# Patient Record
Sex: Female | Born: 1945
Health system: Southern US, Community
[De-identification: ages and names within clinical notes are randomized; demographics above are authoritative.]

## PROBLEM LIST (undated history)

## (undated) DIAGNOSIS — K5790 Diverticulosis of intestine, part unspecified, without perforation or abscess without bleeding: Secondary | ICD-10-CM

## (undated) DIAGNOSIS — R609 Edema, unspecified: Secondary | ICD-10-CM

## (undated) DIAGNOSIS — K219 Gastro-esophageal reflux disease without esophagitis: Secondary | ICD-10-CM

## (undated) DIAGNOSIS — F32A Depression, unspecified: Secondary | ICD-10-CM

## (undated) DIAGNOSIS — J40 Bronchitis, not specified as acute or chronic: Secondary | ICD-10-CM

## (undated) DIAGNOSIS — R51 Headache: Secondary | ICD-10-CM

## (undated) DIAGNOSIS — M549 Dorsalgia, unspecified: Secondary | ICD-10-CM

## (undated) DIAGNOSIS — Z87442 Personal history of urinary calculi: Secondary | ICD-10-CM

## (undated) DIAGNOSIS — C801 Malignant (primary) neoplasm, unspecified: Secondary | ICD-10-CM

## (undated) DIAGNOSIS — M48062 Spinal stenosis, lumbar region with neurogenic claudication: Secondary | ICD-10-CM

## (undated) DIAGNOSIS — F419 Anxiety disorder, unspecified: Secondary | ICD-10-CM

## (undated) DIAGNOSIS — F329 Major depressive disorder, single episode, unspecified: Secondary | ICD-10-CM

## (undated) DIAGNOSIS — Z86010 Personal history of colonic polyps: Secondary | ICD-10-CM

## (undated) DIAGNOSIS — I10 Essential (primary) hypertension: Secondary | ICD-10-CM

## (undated) DIAGNOSIS — IMO0001 Reserved for inherently not codable concepts without codable children: Secondary | ICD-10-CM

## (undated) DIAGNOSIS — J189 Pneumonia, unspecified organism: Secondary | ICD-10-CM

## (undated) DIAGNOSIS — Z5189 Encounter for other specified aftercare: Secondary | ICD-10-CM

## (undated) DIAGNOSIS — R9389 Abnormal findings on diagnostic imaging of other specified body structures: Secondary | ICD-10-CM

## (undated) DIAGNOSIS — M1990 Unspecified osteoarthritis, unspecified site: Secondary | ICD-10-CM

## (undated) DIAGNOSIS — R21 Rash and other nonspecific skin eruption: Secondary | ICD-10-CM

## (undated) DIAGNOSIS — R600 Localized edema: Secondary | ICD-10-CM

## (undated) DIAGNOSIS — D649 Anemia, unspecified: Secondary | ICD-10-CM

## (undated) DIAGNOSIS — Z8739 Personal history of other diseases of the musculoskeletal system and connective tissue: Secondary | ICD-10-CM

## (undated) DIAGNOSIS — G9332 Myalgic encephalomyelitis/chronic fatigue syndrome: Secondary | ICD-10-CM

## (undated) DIAGNOSIS — G629 Polyneuropathy, unspecified: Secondary | ICD-10-CM

## (undated) DIAGNOSIS — Z972 Presence of dental prosthetic device (complete) (partial): Secondary | ICD-10-CM

## (undated) DIAGNOSIS — N183 Chronic kidney disease, stage 3 unspecified: Secondary | ICD-10-CM

## (undated) DIAGNOSIS — R5382 Chronic fatigue, unspecified: Secondary | ICD-10-CM

## (undated) DIAGNOSIS — IMO0002 Reserved for concepts with insufficient information to code with codable children: Secondary | ICD-10-CM

## (undated) DIAGNOSIS — H9319 Tinnitus, unspecified ear: Secondary | ICD-10-CM

## (undated) DIAGNOSIS — M545 Low back pain, unspecified: Secondary | ICD-10-CM

## (undated) DIAGNOSIS — E785 Hyperlipidemia, unspecified: Secondary | ICD-10-CM

## (undated) DIAGNOSIS — G4733 Obstructive sleep apnea (adult) (pediatric): Secondary | ICD-10-CM

## (undated) DIAGNOSIS — K5730 Diverticulosis of large intestine without perforation or abscess without bleeding: Secondary | ICD-10-CM

## (undated) DIAGNOSIS — M2140 Flat foot [pes planus] (acquired), unspecified foot: Secondary | ICD-10-CM

## (undated) DIAGNOSIS — R0609 Other forms of dyspnea: Secondary | ICD-10-CM

## (undated) DIAGNOSIS — M51369 Other intervertebral disc degeneration, lumbar region without mention of lumbar back pain or lower extremity pain: Secondary | ICD-10-CM

## (undated) DIAGNOSIS — E119 Type 2 diabetes mellitus without complications: Secondary | ICD-10-CM

## (undated) DIAGNOSIS — C50919 Malignant neoplasm of unspecified site of unspecified female breast: Secondary | ICD-10-CM

## (undated) DIAGNOSIS — Z860101 Personal history of adenomatous and serrated colon polyps: Secondary | ICD-10-CM

## (undated) DIAGNOSIS — R0602 Shortness of breath: Secondary | ICD-10-CM

## (undated) DIAGNOSIS — M4800 Spinal stenosis, site unspecified: Secondary | ICD-10-CM

## (undated) DIAGNOSIS — Z923 Personal history of irradiation: Secondary | ICD-10-CM

## (undated) DIAGNOSIS — M2550 Pain in unspecified joint: Secondary | ICD-10-CM

## (undated) DIAGNOSIS — H9313 Tinnitus, bilateral: Secondary | ICD-10-CM

## (undated) DIAGNOSIS — D573 Sickle-cell trait: Secondary | ICD-10-CM

## (undated) DIAGNOSIS — G8929 Other chronic pain: Secondary | ICD-10-CM

## (undated) HISTORY — DX: Obstructive sleep apnea (adult) (pediatric): G47.33

## (undated) HISTORY — PX: COLONOSCOPY W/ POLYPECTOMY: SHX1380

## (undated) HISTORY — DX: Chronic fatigue, unspecified: R53.82

## (undated) HISTORY — PX: BACK SURGERY: SHX140

## (undated) HISTORY — DX: Dorsalgia, unspecified: M54.9

## (undated) HISTORY — DX: Malignant (primary) neoplasm, unspecified: C80.1

## (undated) HISTORY — DX: Pain in unspecified joint: M25.50

## (undated) HISTORY — DX: Edema, unspecified: R60.9

## (undated) HISTORY — DX: Tinnitus, unspecified ear: H93.19

## (undated) HISTORY — DX: Diverticulosis of intestine, part unspecified, without perforation or abscess without bleeding: K57.90

## (undated) HISTORY — PX: BELPHAROPTOSIS REPAIR: SHX369

## (undated) HISTORY — PX: TOE SURGERY: SHX1073

## (undated) HISTORY — DX: Essential (primary) hypertension: I10

## (undated) HISTORY — DX: Hyperlipidemia, unspecified: E78.5

## (undated) HISTORY — DX: Spinal stenosis, site unspecified: M48.00

## (undated) HISTORY — DX: Myalgic encephalomyelitis/chronic fatigue syndrome: G93.32

## (undated) HISTORY — PX: JOINT REPLACEMENT: SHX530

## (undated) HISTORY — PX: POLYPECTOMY: SHX149

## (undated) HISTORY — DX: Personal history of colonic polyps: Z86.010

## (undated) HISTORY — PX: BLEPHAROPLASTY: SUR158

---

## 1976-07-31 HISTORY — PX: APPENDECTOMY: SHX54

## 1992-07-31 HISTORY — PX: THYROID CYST EXCISION: SHX2511

## 1996-07-31 DIAGNOSIS — Z860100 Personal history of colon polyps, unspecified: Secondary | ICD-10-CM

## 1996-07-31 HISTORY — DX: Personal history of colonic polyps: Z86.010

## 1996-07-31 HISTORY — DX: Personal history of colon polyps, unspecified: Z86.0100

## 1998-04-29 ENCOUNTER — Other Ambulatory Visit: Admission: RE | Admit: 1998-04-29 | Discharge: 1998-04-29 | Payer: Self-pay | Admitting: Obstetrics and Gynecology

## 1999-06-02 ENCOUNTER — Other Ambulatory Visit: Admission: RE | Admit: 1999-06-02 | Discharge: 1999-06-02 | Payer: Self-pay | Admitting: Obstetrics and Gynecology

## 1999-11-04 ENCOUNTER — Encounter: Admission: RE | Admit: 1999-11-04 | Discharge: 1999-11-04 | Payer: Self-pay | Admitting: *Deleted

## 1999-11-04 ENCOUNTER — Encounter: Payer: Self-pay | Admitting: *Deleted

## 2000-05-14 ENCOUNTER — Other Ambulatory Visit: Admission: RE | Admit: 2000-05-14 | Discharge: 2000-05-14 | Payer: Self-pay | Admitting: Obstetrics and Gynecology

## 2001-02-15 ENCOUNTER — Other Ambulatory Visit: Admission: RE | Admit: 2001-02-15 | Discharge: 2001-02-15 | Payer: Self-pay | Admitting: Obstetrics and Gynecology

## 2001-02-15 ENCOUNTER — Encounter (INDEPENDENT_AMBULATORY_CARE_PROVIDER_SITE_OTHER): Payer: Self-pay

## 2001-05-28 ENCOUNTER — Other Ambulatory Visit: Admission: RE | Admit: 2001-05-28 | Discharge: 2001-05-28 | Payer: Self-pay | Admitting: Obstetrics and Gynecology

## 2002-06-12 ENCOUNTER — Other Ambulatory Visit: Admission: RE | Admit: 2002-06-12 | Discharge: 2002-06-12 | Payer: Self-pay | Admitting: Obstetrics and Gynecology

## 2003-06-16 ENCOUNTER — Other Ambulatory Visit: Admission: RE | Admit: 2003-06-16 | Discharge: 2003-06-16 | Payer: Self-pay | Admitting: Obstetrics and Gynecology

## 2004-06-16 ENCOUNTER — Other Ambulatory Visit: Admission: RE | Admit: 2004-06-16 | Discharge: 2004-06-16 | Payer: Self-pay | Admitting: Obstetrics and Gynecology

## 2004-07-14 ENCOUNTER — Ambulatory Visit (HOSPITAL_COMMUNITY): Admission: RE | Admit: 2004-07-14 | Discharge: 2004-07-14 | Payer: Self-pay | Admitting: General Surgery

## 2004-07-31 DIAGNOSIS — G4733 Obstructive sleep apnea (adult) (pediatric): Secondary | ICD-10-CM

## 2004-07-31 HISTORY — DX: Obstructive sleep apnea (adult) (pediatric): G47.33

## 2004-07-31 HISTORY — PX: TOTAL KNEE ARTHROPLASTY: SHX125

## 2004-08-05 ENCOUNTER — Ambulatory Visit: Payer: Self-pay | Admitting: Internal Medicine

## 2004-08-05 ENCOUNTER — Ambulatory Visit (HOSPITAL_BASED_OUTPATIENT_CLINIC_OR_DEPARTMENT_OTHER): Admission: RE | Admit: 2004-08-05 | Discharge: 2004-08-05 | Payer: Self-pay | Admitting: Family Medicine

## 2004-08-19 ENCOUNTER — Inpatient Hospital Stay (HOSPITAL_COMMUNITY): Admission: RE | Admit: 2004-08-19 | Discharge: 2004-08-23 | Payer: Self-pay | Admitting: Orthopedic Surgery

## 2004-08-19 HISTORY — PX: TOTAL KNEE ARTHROPLASTY: SHX125

## 2004-09-14 ENCOUNTER — Ambulatory Visit (HOSPITAL_COMMUNITY): Admission: RE | Admit: 2004-09-14 | Discharge: 2004-09-14 | Payer: Self-pay | Admitting: Orthopedic Surgery

## 2004-12-05 ENCOUNTER — Ambulatory Visit: Payer: Self-pay | Admitting: Critical Care Medicine

## 2005-01-18 ENCOUNTER — Ambulatory Visit: Payer: Self-pay | Admitting: Critical Care Medicine

## 2005-02-15 ENCOUNTER — Ambulatory Visit: Payer: Self-pay | Admitting: Critical Care Medicine

## 2005-04-04 ENCOUNTER — Ambulatory Visit: Payer: Self-pay | Admitting: Internal Medicine

## 2005-04-14 ENCOUNTER — Ambulatory Visit: Payer: Self-pay | Admitting: Internal Medicine

## 2005-05-17 ENCOUNTER — Other Ambulatory Visit: Admission: RE | Admit: 2005-05-17 | Discharge: 2005-05-17 | Payer: Self-pay | Admitting: Obstetrics and Gynecology

## 2005-05-24 ENCOUNTER — Ambulatory Visit: Payer: Self-pay | Admitting: Physical Medicine & Rehabilitation

## 2005-05-24 ENCOUNTER — Inpatient Hospital Stay (HOSPITAL_COMMUNITY): Admission: RE | Admit: 2005-05-24 | Discharge: 2005-05-29 | Payer: Self-pay | Admitting: Orthopedic Surgery

## 2005-05-24 HISTORY — PX: TOTAL KNEE ARTHROPLASTY: SHX125

## 2005-06-27 ENCOUNTER — Ambulatory Visit (HOSPITAL_COMMUNITY): Admission: RE | Admit: 2005-06-27 | Discharge: 2005-06-27 | Payer: Self-pay | Admitting: Otolaryngology

## 2005-10-04 ENCOUNTER — Ambulatory Visit (HOSPITAL_COMMUNITY): Admission: RE | Admit: 2005-10-04 | Discharge: 2005-10-04 | Payer: Self-pay | Admitting: *Deleted

## 2005-10-04 ENCOUNTER — Encounter (INDEPENDENT_AMBULATORY_CARE_PROVIDER_SITE_OTHER): Payer: Self-pay | Admitting: Specialist

## 2005-10-04 HISTORY — PX: HYSTEROSCOPY WITH D & C: SHX1775

## 2006-03-22 ENCOUNTER — Encounter: Admission: RE | Admit: 2006-03-22 | Discharge: 2006-03-22 | Payer: Self-pay | Admitting: Orthopedic Surgery

## 2006-12-23 ENCOUNTER — Encounter: Admission: RE | Admit: 2006-12-23 | Discharge: 2006-12-23 | Payer: Self-pay | Admitting: Family Medicine

## 2007-09-20 DIAGNOSIS — G4730 Sleep apnea, unspecified: Secondary | ICD-10-CM | POA: Insufficient documentation

## 2007-09-20 DIAGNOSIS — I10 Essential (primary) hypertension: Secondary | ICD-10-CM | POA: Insufficient documentation

## 2007-09-20 DIAGNOSIS — E785 Hyperlipidemia, unspecified: Secondary | ICD-10-CM | POA: Insufficient documentation

## 2007-09-23 ENCOUNTER — Ambulatory Visit: Payer: Self-pay | Admitting: Critical Care Medicine

## 2007-10-11 ENCOUNTER — Encounter: Payer: Self-pay | Admitting: Critical Care Medicine

## 2007-10-15 ENCOUNTER — Ambulatory Visit: Payer: Self-pay | Admitting: Pulmonary Disease

## 2007-10-15 DIAGNOSIS — G4700 Insomnia, unspecified: Secondary | ICD-10-CM | POA: Insufficient documentation

## 2008-08-12 ENCOUNTER — Ambulatory Visit: Payer: Self-pay | Admitting: Vascular Surgery

## 2008-08-12 ENCOUNTER — Ambulatory Visit: Admission: RE | Admit: 2008-08-12 | Discharge: 2008-08-12 | Payer: Self-pay | Admitting: Family Medicine

## 2009-02-18 ENCOUNTER — Encounter: Admission: RE | Admit: 2009-02-18 | Discharge: 2009-02-18 | Payer: Self-pay | Admitting: Family Medicine

## 2009-11-27 ENCOUNTER — Inpatient Hospital Stay (HOSPITAL_COMMUNITY): Admission: EM | Admit: 2009-11-27 | Discharge: 2009-11-28 | Payer: Self-pay | Admitting: Emergency Medicine

## 2009-12-29 HISTORY — PX: TRANSPHENOIDAL / TRANSNASAL HYPOPHYSECTOMY / RESECTION PITUITARY TUMOR: SUR1382

## 2010-01-25 ENCOUNTER — Inpatient Hospital Stay (HOSPITAL_COMMUNITY): Admission: RE | Admit: 2010-01-25 | Discharge: 2010-01-29 | Payer: Self-pay | Admitting: Neurosurgery

## 2010-01-25 ENCOUNTER — Encounter (INDEPENDENT_AMBULATORY_CARE_PROVIDER_SITE_OTHER): Payer: Self-pay | Admitting: Neurosurgery

## 2010-01-25 HISTORY — PX: TRANSPHENOIDAL PITUITARY RESECTION: SHX2572

## 2010-10-16 LAB — BASIC METABOLIC PANEL WITH GFR
BUN: 11 mg/dL (ref 6–23)
BUN: 15 mg/dL (ref 6–23)
BUN: 17 mg/dL (ref 6–23)
CO2: 24 meq/L (ref 19–32)
CO2: 29 meq/L (ref 19–32)
CO2: 29 meq/L (ref 19–32)
Calcium: 10.2 mg/dL (ref 8.4–10.5)
Calcium: 9 mg/dL (ref 8.4–10.5)
Calcium: 9.1 mg/dL (ref 8.4–10.5)
Chloride: 104 meq/L (ref 96–112)
Chloride: 105 meq/L (ref 96–112)
Chloride: 109 meq/L (ref 96–112)
Creatinine, Ser: 1.08 mg/dL (ref 0.4–1.2)
Creatinine, Ser: 1.18 mg/dL (ref 0.4–1.2)
Creatinine, Ser: 1.29 mg/dL — ABNORMAL HIGH (ref 0.4–1.2)
GFR calc non Af Amer: 41 mL/min — ABNORMAL LOW
GFR calc non Af Amer: 46 mL/min — ABNORMAL LOW
GFR calc non Af Amer: 51 mL/min — ABNORMAL LOW
Glucose, Bld: 108 mg/dL — ABNORMAL HIGH (ref 70–99)
Glucose, Bld: 142 mg/dL — ABNORMAL HIGH (ref 70–99)
Glucose, Bld: 187 mg/dL — ABNORMAL HIGH (ref 70–99)
Potassium: 3.3 meq/L — ABNORMAL LOW (ref 3.5–5.1)
Potassium: 3.7 meq/L (ref 3.5–5.1)
Potassium: 3.9 meq/L (ref 3.5–5.1)
Sodium: 138 meq/L (ref 135–145)
Sodium: 140 meq/L (ref 135–145)
Sodium: 141 meq/L (ref 135–145)

## 2010-10-16 LAB — CBC
HCT: 32.8 % — ABNORMAL LOW (ref 36.0–46.0)
Hemoglobin: 10.9 g/dL — ABNORMAL LOW (ref 12.0–15.0)
MCH: 27.5 pg (ref 26.0–34.0)
MCH: 27.5 pg (ref 26.0–34.0)
MCH: 27.7 pg (ref 26.0–34.0)
MCH: 27.7 pg (ref 26.0–34.0)
MCHC: 33.2 g/dL (ref 30.0–36.0)
MCV: 82.5 fL (ref 78.0–100.0)
MCV: 83.6 fL (ref 78.0–100.0)
MCV: 83.7 fL (ref 78.0–100.0)
Platelets: 276 10*3/uL (ref 150–400)
Platelets: 284 10*3/uL (ref 150–400)
Platelets: 291 10*3/uL (ref 150–400)
RBC: 3.98 MIL/uL (ref 3.87–5.11)
RBC: 4.05 MIL/uL (ref 3.87–5.11)
RBC: 4.39 MIL/uL (ref 3.87–5.11)
RDW: 15.4 % (ref 11.5–15.5)
WBC: 11.3 10*3/uL — ABNORMAL HIGH (ref 4.0–10.5)

## 2010-10-16 LAB — BASIC METABOLIC PANEL
BUN: 12 mg/dL (ref 6–23)
CO2: 25 mEq/L (ref 19–32)
CO2: 27 mEq/L (ref 19–32)
Chloride: 105 mEq/L (ref 96–112)
Chloride: 107 mEq/L (ref 96–112)
Creatinine, Ser: 1.14 mg/dL (ref 0.4–1.2)
Creatinine, Ser: 1.2 mg/dL (ref 0.4–1.2)
GFR calc Af Amer: 58 mL/min — ABNORMAL LOW (ref >60)
Glucose, Bld: 154 mg/dL — ABNORMAL HIGH (ref 70–99)

## 2010-10-16 LAB — ABO/RH: ABO/RH(D): A POS

## 2010-10-16 LAB — SURGICAL PCR SCREEN
MRSA, PCR: NEGATIVE
Staphylococcus aureus: POSITIVE — AB

## 2010-10-16 LAB — URINALYSIS, ROUTINE W REFLEX MICROSCOPIC
Bilirubin Urine: NEGATIVE
Hgb urine dipstick: NEGATIVE
Nitrite: NEGATIVE
Protein, ur: NEGATIVE mg/dL
Urobilinogen, UA: 0.2 mg/dL (ref 0.0–1.0)

## 2010-10-18 LAB — DIFFERENTIAL
Basophils Absolute: 0 10*3/uL (ref 0.0–0.1)
Eosinophils Relative: 2 % (ref 0–5)
Lymphocytes Relative: 18 % (ref 12–46)
Monocytes Absolute: 0.6 10*3/uL (ref 0.1–1.0)

## 2010-10-18 LAB — BASIC METABOLIC PANEL
CO2: 29 mEq/L (ref 19–32)
GFR calc non Af Amer: 46 mL/min — ABNORMAL LOW (ref >60)
Glucose, Bld: 121 mg/dL — ABNORMAL HIGH (ref 70–99)
Potassium: 3.5 mEq/L (ref 3.5–5.1)
Sodium: 146 mEq/L — ABNORMAL HIGH (ref 135–145)

## 2010-10-18 LAB — URINALYSIS, ROUTINE W REFLEX MICROSCOPIC
Ketones, ur: NEGATIVE mg/dL
Nitrite: NEGATIVE
Protein, ur: NEGATIVE mg/dL
Urobilinogen, UA: 0.2 mg/dL (ref 0.0–1.0)

## 2010-10-18 LAB — METANEPHRINES, PLASMA
Metanephrine, Free: <25 pg/mL (ref <=57)
Normetanephrine, Free: 81 pg/mL (ref <=148)
Total Metanephrines-Plasma: 81 pg/mL (ref <=205)

## 2010-10-18 LAB — URINE CULTURE: Colony Count: >=100000

## 2010-10-18 LAB — CBC
HCT: 34.9 % — ABNORMAL LOW (ref 36.0–46.0)
Hemoglobin: 11.6 g/dL — ABNORMAL LOW (ref 12.0–15.0)
MCHC: 33 g/dL (ref 30.0–36.0)
RBC: 4.34 MIL/uL (ref 3.87–5.11)
RDW: 15.7 % — ABNORMAL HIGH (ref 11.5–15.5)

## 2010-10-18 LAB — COMPREHENSIVE METABOLIC PANEL
ALT: 20 U/L (ref 0–35)
AST: 21 U/L (ref 0–37)
Alkaline Phosphatase: 64 U/L (ref 39–117)
CO2: 26 mEq/L (ref 19–32)
Calcium: 9.6 mg/dL (ref 8.4–10.5)
GFR calc Af Amer: 57 mL/min — ABNORMAL LOW (ref >60)
Potassium: 3.6 mEq/L (ref 3.5–5.1)
Sodium: 137 mEq/L (ref 135–145)
Total Protein: 7.8 g/dL (ref 6.0–8.3)

## 2010-10-18 LAB — TSH: TSH: 2.028 u[IU]/mL (ref 0.350–4.500)

## 2010-10-18 LAB — CK TOTAL AND CKMB (NOT AT ARMC)
CK, MB: 2.3 ng/mL (ref 0.3–4.0)
Relative Index: 1 (ref 0.0–2.5)
Total CK: 240 U/L — ABNORMAL HIGH (ref 7–177)

## 2010-10-18 LAB — GROWTH HORMONE: Growth Hormone: <0.05 ng/mL (ref 0.03–10.00)

## 2010-10-18 LAB — TROPONIN I: Troponin I: 0.01 ng/mL (ref 0.00–0.06)

## 2010-10-18 LAB — CARDIAC PANEL(CRET KIN+CKTOT+MB+TROPI)
Relative Index: 0.7 (ref 0.0–2.5)
Troponin I: 0.01 ng/mL (ref 0.00–0.06)
Troponin I: 0.01 ng/mL (ref 0.00–0.06)

## 2010-10-18 LAB — RAPID URINE DRUG SCREEN, HOSP PERFORMED
Amphetamines: NOT DETECTED
Barbiturates: NOT DETECTED

## 2010-10-18 LAB — POCT CARDIAC MARKERS: Troponin i, poc: <0.05 ng/mL (ref 0.00–0.09)

## 2010-10-18 LAB — T3: T3, Total: 75.8 ng/dl — ABNORMAL LOW (ref 80.0–204.0)

## 2010-10-18 LAB — LUTEINIZING HORMONE: LH: 4.5 m[IU]/mL

## 2010-10-18 LAB — INSULIN-LIKE GROWTH FACTOR: Somatomedin C: 151

## 2010-10-18 LAB — LIPID PANEL: Triglycerides: 141 mg/dL (ref <150)

## 2010-10-30 DIAGNOSIS — J189 Pneumonia, unspecified organism: Secondary | ICD-10-CM

## 2010-10-30 HISTORY — DX: Pneumonia, unspecified organism: J18.9

## 2010-11-09 ENCOUNTER — Inpatient Hospital Stay (HOSPITAL_COMMUNITY)
Admission: EM | Admit: 2010-11-09 | Discharge: 2010-11-14 | DRG: 195 | Disposition: A | Discharge status: Home / Self Care | Payer: Medicare Other | Attending: Internal Medicine | Admitting: Internal Medicine

## 2010-11-09 ENCOUNTER — Emergency Department (HOSPITAL_COMMUNITY): Payer: Medicare Other

## 2010-11-09 DIAGNOSIS — M4800 Spinal stenosis, site unspecified: Secondary | ICD-10-CM | POA: Diagnosis present

## 2010-11-09 DIAGNOSIS — I10 Essential (primary) hypertension: Secondary | ICD-10-CM | POA: Diagnosis present

## 2010-11-09 DIAGNOSIS — D649 Anemia, unspecified: Secondary | ICD-10-CM | POA: Diagnosis present

## 2010-11-09 DIAGNOSIS — R51 Headache: Secondary | ICD-10-CM | POA: Diagnosis present

## 2010-11-09 DIAGNOSIS — M549 Dorsalgia, unspecified: Secondary | ICD-10-CM | POA: Diagnosis present

## 2010-11-09 DIAGNOSIS — E785 Hyperlipidemia, unspecified: Secondary | ICD-10-CM | POA: Diagnosis present

## 2010-11-09 DIAGNOSIS — R918 Other nonspecific abnormal finding of lung field: Secondary | ICD-10-CM | POA: Diagnosis present

## 2010-11-09 DIAGNOSIS — J189 Pneumonia, unspecified organism: Principal | ICD-10-CM | POA: Diagnosis present

## 2010-11-09 DIAGNOSIS — G8929 Other chronic pain: Secondary | ICD-10-CM | POA: Diagnosis present

## 2010-11-09 DIAGNOSIS — E669 Obesity, unspecified: Secondary | ICD-10-CM | POA: Diagnosis present

## 2010-11-09 LAB — COMPREHENSIVE METABOLIC PANEL
AST: 21 U/L (ref 0–37)
Albumin: 3.7 g/dL (ref 3.5–5.2)
Alkaline Phosphatase: 69 U/L (ref 39–117)
BUN: 17 mg/dL (ref 6–23)
Creatinine, Ser: 1.2 mg/dL (ref 0.4–1.2)
GFR calc Af Amer: 54 mL/min — ABNORMAL LOW (ref >60)
Potassium: 3.5 mEq/L (ref 3.5–5.1)
Total Protein: 8 g/dL (ref 6.0–8.3)

## 2010-11-09 LAB — CBC
MCV: 78.8 fL (ref 78.0–100.0)
Platelets: 272 10*3/uL (ref 150–400)
RBC: 4.38 MIL/uL (ref 3.87–5.11)
RDW: 15.9 % — ABNORMAL HIGH (ref 11.5–15.5)
WBC: 14 10*3/uL — ABNORMAL HIGH (ref 4.0–10.5)

## 2010-11-09 LAB — URINALYSIS, ROUTINE W REFLEX MICROSCOPIC
Bilirubin Urine: NEGATIVE
Glucose, UA: NEGATIVE mg/dL
Specific Gravity, Urine: 1.018 (ref 1.005–1.030)

## 2010-11-09 LAB — DIFFERENTIAL
Basophils Relative: 0 % (ref 0–1)
Eosinophils Absolute: 0 10*3/uL (ref 0.0–0.7)
Eosinophils Relative: 0 % (ref 0–5)
Lymphs Abs: 2.1 10*3/uL (ref 0.7–4.0)
Neutrophils Relative %: 76 % (ref 43–77)

## 2010-11-09 LAB — URINE MICROSCOPIC-ADD ON

## 2010-11-10 LAB — COMPREHENSIVE METABOLIC PANEL
ALT: 25 U/L (ref 0–35)
AST: 20 U/L (ref 0–37)
Albumin: 3 g/dL — ABNORMAL LOW (ref 3.5–5.2)
Alkaline Phosphatase: 62 U/L (ref 39–117)
Chloride: 106 mEq/L (ref 96–112)
Creatinine, Ser: 1.04 mg/dL (ref 0.4–1.2)
GFR calc Af Amer: >60 mL/min (ref >60)
Potassium: 3.7 mEq/L (ref 3.5–5.1)
Sodium: 141 mEq/L (ref 135–145)
Total Bilirubin: 0.6 mg/dL (ref 0.3–1.2)

## 2010-11-10 LAB — URINE CULTURE: Colony Count: 7000

## 2010-11-10 LAB — CBC
Platelets: 274 10*3/uL (ref 150–400)
RBC: 4.08 MIL/uL (ref 3.87–5.11)
WBC: 11.9 10*3/uL — ABNORMAL HIGH (ref 4.0–10.5)

## 2010-11-10 LAB — LEGIONELLA ANTIGEN, URINE

## 2010-11-11 LAB — LIPID PANEL
LDL Cholesterol: 156 mg/dL — ABNORMAL HIGH (ref 0–99)
Triglycerides: 83 mg/dL (ref <150)
VLDL: 17 mg/dL (ref 0–40)

## 2010-11-11 LAB — CBC
HCT: 31 % — ABNORMAL LOW (ref 36.0–46.0)
Platelets: 280 10*3/uL (ref 150–400)
RDW: 15.9 % — ABNORMAL HIGH (ref 11.5–15.5)
WBC: 17.1 10*3/uL — ABNORMAL HIGH (ref 4.0–10.5)

## 2010-11-11 LAB — HEMOGLOBIN A1C: Mean Plasma Glucose: 146 mg/dL — ABNORMAL HIGH (ref <117)

## 2010-11-12 LAB — CULTURE, BLOOD (ROUTINE X 2)

## 2010-11-12 LAB — CBC
Platelets: 266 10*3/uL (ref 150–400)
RBC: 3.79 MIL/uL — ABNORMAL LOW (ref 3.87–5.11)
WBC: 15.7 10*3/uL — ABNORMAL HIGH (ref 4.0–10.5)

## 2010-11-12 LAB — DIFFERENTIAL
Basophils Absolute: 0 10*3/uL (ref 0.0–0.1)
Basophils Relative: 0 % (ref 0–1)
Eosinophils Absolute: 0.1 10*3/uL (ref 0.0–0.7)
Neutrophils Relative %: 78 % — ABNORMAL HIGH (ref 43–77)

## 2010-11-12 LAB — BASIC METABOLIC PANEL
Chloride: 108 mEq/L (ref 96–112)
GFR calc Af Amer: >60 mL/min (ref >60)
Potassium: 3.6 mEq/L (ref 3.5–5.1)

## 2010-11-13 LAB — VANCOMYCIN, TROUGH: Vancomycin Tr: 20.7 ug/mL — ABNORMAL HIGH (ref 10.0–20.0)

## 2010-11-14 ENCOUNTER — Inpatient Hospital Stay (HOSPITAL_COMMUNITY): Payer: Medicare Other

## 2010-11-14 LAB — CBC
HCT: 30.9 % — ABNORMAL LOW (ref 36.0–46.0)
Hemoglobin: 10 g/dL — ABNORMAL LOW (ref 12.0–15.0)
MCH: 25.4 pg — ABNORMAL LOW (ref 26.0–34.0)
MCHC: 32.4 g/dL (ref 30.0–36.0)

## 2010-11-14 NOTE — H&P (Signed)
[name], [name] NO.:  [id number]  MEDICAL RECORD NO.:  [id number]           PATIENT TYPE:  E  LOCATION:  WLED                         FACILITY:  [facility]  PHYSICIAN:  Richarda Overlie, MD       DATE OF BIRTH:  [birth year]  DATE OF ADMISSION:  11/09/2010 DATE OF DISCHARGE:                             HISTORY & PHYSICAL   PRIMARY CARE PHYSICIAN:  Dr. [name].  CHIEF COMPLAINT:  Headache.  SUBJECTIVE:  This is a 65 year old female who presents to the ED because of leukocytosis.  The patient presented to Urgent Care today because of headache and fever of 103 degrees Fahrenheit at home.  In the Urgent Care, the patient was found to have leukocytosis and the patient was prompted to come to the ER for further evaluation.  The patient denies any neck stiffness, but she does complain of global headache.  Initially the headache started out to be occipital headache and subsequently changed to global headache, which she has had for the last 2 weeks.  She denies any cough, congestion, or sore throat.  However, she has experienced occasional chills over the last couple of days.  She denies any nausea, vomiting, diarrhea, constipation, blood in the stool, or black tarry stools.  She denies any chest pain or any shortness of breath.  She denies any sick contacts.  The patient was found to have leukocytosis with a white count of 14,000 and chest x-ray that was consistent with right lower lobe pneumonia.  PAST MEDICAL HISTORY: 1. History of pituitary macroadenoma, status post transsphenoidal     surgery of the pituitary macroadenoma by Dr. Gerlene Fee. 2. Hypertension. 3. Dyslipidemia. 4. Morbid obesity.  PAST SURGICAL HISTORY:  History of recent transsphenoidal surgery for a pituitary macroadenoma, history of appendectomy in 1978, bilateral knee replacement, knee arthroscopy in 1994, bladder cyst removal in 1997, and surgery on the right eye in 2005.  HOME  MEDICATIONS:  Aspirin and pravastatin.  The patient cannot recall if she is on antihypertensive medication.  FAMILY HISTORY:  Significant for coronary artery disease, hypertension, diabetes, and arthritis.  SOCIAL HISTORY:  She is retired, married with 2 children who live in the area.  REVIEW OF SYSTEMS:  Complete review of systems was done as documented in HPI.  PHYSICAL EXAMINATION:  VITAL SIGNS:  Blood pressure 170/92, pulse 90, respirations 24, and temperature 100.6. GENERAL:  Comfortable, currently in no acute cardiopulmonary distress. HEENT:  Pupils equal and reactive.  Extraocular movements intact. NECK:  Supple.  No JVD. LUNGS: Clear to auscultation bilaterally.  No wheezes, crackles, or rhonchi. CARDIOVASCULAR:  Regular rate and rhythm.  No murmurs, rubs, or gallops. ABDOMEN:  Soft, nontender, and nondistended. EXTREMITIES:  Without cyanosis, clubbing, or edema. NEUROLOGIC:  Cranial nerves II through XII grossly intact. PSYCHIATRIC:  Appropriate mood and affect. LYMPHATIC:  No axillary, inguinal, or cervical lymphadenopathy.  LABORATORY DATA:  Urinalysis is negative for nitrite and leukocyte esterase.  Sodium 136, potassium 3.5, chloride 100, bicarbonate 26, glucose 100, BUN 17, creatinine 1.2, and calcium 10.1.  CBC, WBC 14.0 with a left shift, hemoglobin 11.3, hematocrit 34.5, and platelet count of  272.  ASSESSMENT AND PLAN: 1. Right lower lobe pneumonia/atypical pneumonia. 2. Normocytic anemia could be consistent with possibly mycoplasma     pneumonia.  The patient will be admitted for treatment of her pneumonia and her headache.  There are no signs of meningismus or meningitis at this time clinically.  Most likely the patient has atypical pneumonia, likely mycoplasma pneumonia, which is known to cause meningoencephalitis, which can explain her headache as well as hemolytic anemia, which can explain her anemia.  The patient will be treated with azithromycin and  Rocephin for her pneumonia.  The patient has a history of pituitary macroadenoma and transsphenoidal surgery.  She states that she had an MRI 2 weeks ago, which was within normal limits.  The patient also had a CT of the head without contrast today that does not show any acute intracranial findings.  The patient is also hypertensive with systolic blood pressure in 170, which could explain her headaches due to uncontrolled hypertension.  We will start her on Norvasc and try to reconcile home medications and titrate medications for better blood pressure control. She is a full code.     Richarda Overlie, MD     NA/MEDQ  D:  11/09/2010  T:  11/09/2010  Job:  [id number]  Electronically Signed by Richarda Overlie MD on 11/13/2010 04:40:01 PM

## 2010-11-16 LAB — CULTURE, BLOOD (ROUTINE X 2): Culture: NO GROWTH

## 2010-11-17 LAB — CULTURE, BLOOD (ROUTINE X 2)
Culture: NO GROWTH
Culture: NO GROWTH

## 2010-11-22 NOTE — Discharge Summary (Signed)
[name], [name]            ACCOUNT NO.:  [id number]  MEDICAL RECORD NO.:  [id number]           PATIENT TYPE:  I  LOCATION:  1503                         FACILITY:  [facility]  PHYSICIAN:  Druscilla Petsch I Nichole Keltner, MD      DATE OF BIRTH:  [birth year]  DATE OF ADMISSION:  11/09/2010 DATE OF DISCHARGE:  11/14/2010                              DISCHARGE SUMMARY   DISCHARGE DIAGNOSES: 1. Community-acquired pneumonia. 2. Headache, felt to be possibility related to community-acquired     pneumonia, hypertension, cannot rule out migraine, the patient has     already had an MRI with her neurosurgeon on April 11th, and seems     okay per patient. 3. Persistent right infrahilar fullness.  Recommend repeat chest x-ray     within 2-3 months and if it still persists, CT scan of the chest. 4. Anemia, need to follow up and monitor with primary care physician     as outpatient. 5. Leukocytosis, improving. 6. Hypertension. 7. Hyperlipidemia. 8. History of pituitary microadenoma, status post transsphenoidal     surgery.  This is followed up every 6 months with a neurosurgeon. 9. Coagulase-negative Staphylococcus aureus bacteremia in 1 out of 4     bottles, seems contaminant, fever resolved. 10.Chronic back pain and spinal stenosis. 11.Obesity.  CONSULTATION:  None.  MEDICATIONS: 1. Excedrin 1 tab q.6 h. as needed. 2. Levaquin 750 mg p.o. daily for 7 days. 3. Alprazolam 0.25 mg twice daily as needed. 4. Fish oil. 5. Losartan/hydrochlorothiazide 100/25 daily. 6. Niacin 500 mg p.o. twice daily. 7. Plavix 75 mg daily. 8. Systane lubricant eye drops. 9. Tramadol 50 mg every 6 hours as needed. 10.Verapamil SR 360 mg p.o. daily.  FOLLOWUP:  The patient needs to follow up with her physician by the end of this week and the patient is advised if headache gets worsen, need to come to the hospital, and contact her physician.  PROCEDURES: 1. Chest x-ray, possible right lower lobe infiltrate. 2. CT head  without contrast, interval change appearance of the     previously __________ sella turcica with near-opacification of the     right division of the sphenoid sinus and interval transsphenoidal     resection of previous pituitary mass. 3. Chest x-ray, persistent right infrahilar fullness, nonspecific,     could represent residual airspace disease.  Other consideration     include overlapping shadow versus underlying nodule, mass, or     adenopathy.  HISTORY OF PRESENT ILLNESS:  This is a 65 year old female, presented to the ED because of leukocytosis, the patient presented to urgent care, complaining of headache and fever of 103 at home, found to have leukocytosis and the patient transferred to ED for further workup.  The patient denies any neck stiffness or she does complain of global headache.  The patient denies any blurring of vision associated with it. Denies any cough, congestion or sore throat.  She has chills.  She is found to have white blood cells of 14 and chest x-ray suggests could be pneumonia. 1. The patient treated as a case of community-acquired pneumonia with     Levaquin.  Blood  cultures were negative, except 1 bottle of     staphylococcus, coagulase negative, felt to be secondary to     contamination.  The patient's headache felt to be possibility     secondary to the virus infection intervening with ongoing bacterial     infection.  The patient has an MRI done of her pituitary after the     procedure, which was negative.  She also had a CT head, which was     negative for any evidence of bleeding.  The patient denies any     worsening headache, denies any blurring of vision, and the patient     was treated symptomatically for the headache. 2. As per discussion with the patient, headache completely resolved.     Did not require any form of aggressive pain medications. 3. Leukocytosis, improved.  Repeat chest x-ray suggests still there is     fullness at the perihilar  area, recommend repeat chest x-ray.  The     patient informed, need to repeat chest x-ray within 1 month, and if     it still persists may need to have a CT scan of the chest. 4. Hypertension.  The patient will resume her blood pressure     medication include losartan/hydrochlorothiazide and verapamil 360     mg daily. 5. Febrile illness, felt to be secondary to community-acquired     pneumonia.  The patient has no more fever since April 14th.  The     patient was advised to follow up with her physician next week.  The patient needs to follow up with Dr. [name], repeat chest x- ray, and possibility of CT scan if chest x-ray did show the current fullness.  Also, the patient needs to follow up if headache recurs or worsens.     Tonia Avino Bosie Helper, MD     HIE/MEDQ  D:  11/14/2010  T:  11/15/2010  Job:  [id number]  cc:   [name] Quitter, M.D. Fax: [phone]  Electronically Signed by Ebony Cargo MD on 11/22/2010 02:05:09 PM

## 2010-11-29 NOTE — Group Therapy Note (Signed)
  NAMEOMAYA, NIELAND NO.:  [id number]  MEDICAL RECORD NO.:  [id number]           PATIENT TYPE:  I  LOCATION:  1503                         FACILITY:  [facility]  PHYSICIAN:  Homero Fellers, MD   DATE OF BIRTH:  Apr 25, 1946                                PROGRESS NOTE   INTERIM SUMMARY:  DISCHARGE DATE: To be decided.  DIAGNOSES: 1. Right lower lobe pneumonia. 2. Coagulase-negative Staphylococcus aureus bacteremia in 1 of 4     bottles, probably a contaminant. 3. High-grade fever, currently resolving. 4. Resolving leukocytosis. 5. Headache, which is clinically improved. 6. History of pituitary microadenoma. 7. Status post transsphenoidal surgery. 8. Chronic back pain and spinal stenosis. 9. Hyperlipidemia. 10.Morbid obesity.  MEDICATIONS: To be determined at discharge.  CONSULTATIONS: None.  PROCEDURES: None.  HOSPITAL COURSE: This is a 65 year old woman who presented to the emergency room becauseof headaches, fever and elevated white count at Urgent Care.  According to the patient, the headache has been going on off and on for the past 2 weeks.  She had no meningeal signs of confusion.  She has chronic back pain and she gets multiple back injections by her neurologist and spine doctor.  She has been following with her neurologist about headache and she has already got an MRI of the brain about 2 weeks ago which was within normal limits.  At admission, she also had a noncontrast CT of the head which showed no acute intracranial findings.  Temperature is beginning to normalize.  T-max in the past 24 hours is 100.  Headache has also improved.  She has no nausea or vomiting.  PHYSICAL EXAMINATION: VITAL SIGNS:  Today, blood pressure of 111/76, pulse 84, respirations 22, temperature is 99.1, O2 sat is 91% to 96% on 2 L.  GENERAL:  The patient appeared comfortable, in no distress.  NECK:  Supple.  No JVD. LUNGS:  Good air entry bilaterally with no  wheezing or crackles.  HEART: S1, S2 regular rate and rhythm.  No murmurs, rubs or gallops.  ABDOMEN: Obese, soft, nontender.  EXTREMITIES:  No edema.  NEUROLOGIC:  No focal deficits.  LABORATORY DATA: None today.  I have ordered for repeat labs tomorrow.  Repeat blood cultures done on the 13th showed no growth so far.  Blood culture and 11th is growing coagulase-negative Staphylococcus from a single blood culture bottle, that is 1 out of 2.  IMAGING STUDIES: Chest x-ray at admission showed evidence suggestive of right lower lobe infiltrates.  CT of the head showed no acute intracranial findings.  DISPOSITION: To home, hopefully in the next 24 to 48 hours.  DISCHARGE INSTRUCTIONS: 1. She can follow with Dr. Rennis Harding in 2 weeks, and also follow with her     neurologist after discharge. 2. Diet is 2 g sodium. 3. Activity as tolerated.     Homero Fellers, MD     FA/MEDQ  D:  11/13/2010  T:  11/13/2010  Job:  [id number]  Electronically Signed by Homero Fellers  on 11/29/2010 02:38:35 AM

## 2010-11-29 NOTE — Group Therapy Note (Signed)
  NAMEBREALYNN, CONTINO NO.:  [id number]  MEDICAL RECORD NO.:  [id number]           PATIENT TYPE:  I  LOCATION:  1503                         FACILITY:  [facility]  PHYSICIAN:  Homero Fellers, MD   DATE OF BIRTH:  1946-01-07                                PROGRESS NOTE   SUBJECTIVE: The patient is a little bit better today.  She is having some cough. Headache is improved.  She was having high-grade fever yesterday but this has improved since morning.  OBJECTIVE: VITAL SIGNS:  Blood pressure of 156/90, pulse 93, respirations 18, temperature 98.7.  Last fever spike was last night at 101.7.  O2 sat is 93% on room air. GENERAL:  The patient is lying in bed, comfortable in no distress. HEENT:  Pallor. Extraocular muscles are intact. NECK:  Supple.  No JVD, adenopathy, or thyromegaly. LUNGS:  Clear to auscultation bilaterally. HEART:  S1-S2.  No murmurs, rubs, or gallops. ABDOMEN:  Full, soft, obese, and nontender.  Bowel sounds present.  No masses. EXTREMITIES:  No edema.  LABORATORY DATA: White count is coming down, today 15.7 with very minimal left shift. Hemoglobin 9.7, platelet count is 266,000.  Blood culture, coagulase- negative staph in 1/2 blood culture bottles.  Repeat blood culture done yesterday is also pending.  ASSESSMENT: 1. Right lower lobe pneumonia, on antibiotics. 2. Coagulase-negative Staphylococcus aureus bacteremia, probably a     contaminant but in view of fever and leukocytosis, I will keep her     on vancomycin. 3. Resolving leukocytosis. 4. Improved NAD. 5. History of chronic back pain and spinal stenosis. 6. History of pituitary microadenoma. 7. Hyperlipidemia.  PLAN: 1. Continue antibiotics and Tessalon Perles for cough. 2. Follow pending blood cultures. 3. The patient can be discharged home probably in the next 48 hours if     no further fever spikes and WBC normalizes.  CONDITION: Improving.     Homero Fellers,  MD     FA/MEDQ  D:  11/12/2010  T:  11/12/2010  Job:  [id number]  Electronically Signed by Homero Fellers  on 11/29/2010 02:38:32 AM

## 2010-11-29 NOTE — Group Therapy Note (Signed)
  NAMEJANERA, PEUGH NO.:  [id number]  MEDICAL RECORD NO.:  [id number]           PATIENT TYPE:  I  LOCATION:  1503                         FACILITY:  [facility]  PHYSICIAN:  Homero Fellers, MD   DATE OF BIRTH:  09-Nov-1945                                PROGRESS NOTE   SUBJECTIVE: The patient is complaining of headaches.  She also has low-grade fever. She has been given Toradol, as well as Vicodin with little help.  OBJECTIVE: VITAL SIGNS:  This morning showed blood pressure of 126/76, pulse 91, respirations 20, temperature is 99.3, O2 sat 95% on room aim. GENERAL:  The patient lying in bed, comfortable, no distress. HEENT:  PERRLA. Extraocular muscles are intact. NECK:  Supple.  No JVD, adenopathy, or thyromegaly. LUNGS:  Clear to auscultation bilaterally. HEART:  S1, S2. No murmurs, rubs or gallops. ABDOMEN:  Obese, soft, nontender.  Bowel sounds present.  No masses. EXTREMITIES:  Trace edema bilaterally. NEUROLOGIC:  No deficits.  LABORATORY DATA: White count is up to 17.1, hemoglobin 10.1. Lipid profile showed LDL of 156, HDL of 32, total cholesterol of 205.  Urine culture is significant growth.  Blood culture is growing gram-positive cocci in 1 of 2 blood culture bottles, that is gram positive cocci in clusters.  ASSESSMENT: 1. Community acquired pneumonia. 2. Bacteremia (gram-positive cocci in clusters). 3. Worsening leukocytosis. 4. Persistent headache. 5. he patient has had repeated back injections for chronic back pain.     She also has history of pituitary macroadenoma status post     transsphenoidal surgery.  She follows with her neurologist and she     had an appointment this past week with an MRI done recently showing     no acute findings. 6. Anemia, fairly stable. 7. Hyperlipidemia which is not optimized.  PLAN: Change antibiotics to Levaquin and vancomycin.  Follow blood culture identification. Repeat blood culture if temperature more  than 100.8. Start naproxen and hydroxyzine for pain.  The patient also has a history of sleep apnea and use CPAP at home.  This will be resumed. Condition overall is stable.  I will increase her Zocor to 40 mg at bedtime to better control her hyperlipidemia.     Homero Fellers, MD     FA/MEDQ  D:  11/11/2010  T:  11/11/2010  Job:  [id number]  Electronically Signed by Homero Fellers  on 11/29/2010 02:38:26 AM

## 2010-12-07 ENCOUNTER — Other Ambulatory Visit: Payer: Self-pay | Admitting: Family Medicine

## 2010-12-07 ENCOUNTER — Ambulatory Visit
Admission: RE | Admit: 2010-12-07 | Discharge: 2010-12-07 | Disposition: A | Discharge status: Home / Self Care | Payer: Medicare Other | Source: Ambulatory Visit | Attending: Family Medicine | Admitting: Family Medicine

## 2010-12-07 DIAGNOSIS — J189 Pneumonia, unspecified organism: Secondary | ICD-10-CM

## 2010-12-16 NOTE — Op Note (Signed)
[name], [name] NO.:  [id number]   MEDICAL RECORD NO.:  [id number]          PATIENT TYPE:  INP   LOCATION:  0004                         FACILITY:  [facility]   PHYSICIAN:  Georges Lynch. Gioffre, M.D.DATE OF BIRTH:  [birth year]   DATE OF PROCEDURE:  05/24/2005  DATE OF DISCHARGE:                                 OPERATIVE REPORT   SURGEON:  Georges Lynch. Darrelyn Hillock, M.D.   ASSISTANT:  Jamelle Rushing, P.A.   PREOPERATIVE DIAGNOSIS:  Severe degenerative arthritis of the right knee  with a genu valgus deformity.   POSTOPERATIVE DIAGNOSIS:  Severe degenerative arthritis of the right knee  with a genu valgus deformity.   OPERATION:  Right total knee arthroplasty utilizing the DePuy rotating  platform system. All three components were cemented.   SIZES USED:  The patella with a size 38 resurfacing patella. The femoral  component was a size 4 right posterior cruciate sacrificing type, the tibial  component was a size 5 tibial tray with a size 4, 15 mm thickness insert.   PROCEDURE:  Under general anesthesia, routine orthopedic prep and draping of  the right lower extremity was carried out. She had 1 gram of IV Ancef. At  this time, an incision was made over the anterior aspect of the right knee,  bleeders identified and cauterized. Note prior to making the incision, we  did exsanguinate the right lower extremity with an Esmarch and then elevated  the tourniquet at 400 mmHg. Following this, an incision was made over the  anterior aspect of the right knee. She had 1 gram of IV Ancef preop. At this  time, two flaps were created. I then carried out a median parapatellar  incision reflecting the patella laterally and then went down and inserted  the self-retaining retractors. I then carried out medial and lateral  meniscectomies and excised the anterior posterior cruciate ligaments. Note,  she had a marked overgrowth of bone spurs in her femur, her patella, the  tibia. All of  these spurs were completely carefully removed prior to doing  any other procedure. Once we had the spurs removed, I then went down and  released the lateral and medial structures in the usual fashion. We first  put our initial drill hole in the intercondylar notch of the femur, #1 jig  was inserted and we removed 12 mm thickness off the distal femur because of  her contracture. I also noted, she had a large defect over the lateral joint  with a genu valgus. After doing this, we then inserted our #2 jig for a size  four femur. We cut our anterior, posterior and chamfering cuts. After this  was done, we then cut our tibia. Utilizing the intramedullary guide, I  removed 4 mm thickness off the affected lateral side of the tibia. We did a  nice release of the tibial plateau region because of the contracture and the  valgus deformity and we did remove the spur prior to making the cut. After  the cut was made, we then made our keel cut for a size 5 tibial tray. We  then  cut our notch cut out of the distal femur in the usual fashion. We then  inserted our trial components, went through a range of motion and had  excellent stability. We first tried a 12.5 mm thickness insert and then went  to a 15 mm thickness insert. We then cut our patella for a size 38 mm  patella. We did a resurfacing type procedure on the patella with three drill  holes in the patella. We then inserted our 38 mm patella trial and it fit  quite nicely at this point. We then removed all the trials, thoroughly water  picked out the knee, dried the knee out and cemented all three components in  simultaneously utilizing vancomycin highlight in the cement. After this was  done, we then removed all loose pieces of cement, water picked the knee,  dried the knee out and then when we selected our appropriate tibial insert,  we inserted the 15 mm thickness rotating platform insert, took the knee  through motion, had excellent stability in  the medial and lateral plane. We  had a nice lateral release well and flexion extension was excellent. The  patella tracked nicely in the midline. We then thoroughly water picked out  the knee again, inserted a Hemovac drain and closed the knee in the usual  fashion. The subcu was closed with #0 Vicryl, skin was closed with metal  staples. A sterile Neosporin dressing was applied. So the sizes used was a  size four right femoral component for posterior cruciate sacrificing type.  We utilized a rotating platform 15 mm thickness size 4 for the tibia, the  permanent tibial tray that was cemented in was a size 5 tibial tray and the  patella was a 38 mm patella. The surgeon was Dr. Darrelyn Hillock, assistant Jamelle Rushing, P.A.           ______________________________  Georges Lynch. Darrelyn Hillock, M.D.     RAG/MEDQ  D:  05/24/2005  T:  05/24/2005  Job:  [id number]

## 2010-12-16 NOTE — Discharge Summary (Signed)
NAMEGRAYCIE, [name] NO.:  [id number]   MEDICAL RECORD NO.:  [id number]          PATIENT TYPE:  INP   LOCATION:  0472                         FACILITY:  [facility]   PHYSICIAN:  Georges Lynch. Gioffre, M.D.DATE OF BIRTH:  08/09/1945   DATE OF ADMISSION:  08/19/2004  DATE OF DISCHARGE:  08/23/2004                                 DISCHARGE SUMMARY   ADMISSION DIAGNOSES:  1.  Severe degenerative arthritis, left knee.  2.  High cholesterol.  3.  Hypertension.  4.  Previous urinary tract infection.  5.  Previous pneumonia.   DISCHARGE DIAGNOSES:  1.  Severe degenerative arthritis, left knee, status post right total knee      arthroplasty.  2.  High cholesterol.  3.  Hypertension.  4.  Previous urinary tract infection.  5.  Previous pneumonia.   PROCEDURE:  The patient was taken to the operating room on August 19, 2004  to undergo a left total knee arthroplasty.  The surgery was performed under  general anesthesia.  A Hemovac drain was placed at the time of surgery.   SURGEON:  Georges Lynch. Darrelyn Hillock, M.D.   ASSISTANT:  Madlyn Frankel. Charlann Boxer, M.D., Ebbie Ridge. Paitsel, P.A.-C.   CONSULTATIONS:  PT, OT.   HISTORY OF PRESENT ILLNESS:  This patient is a 65 year old female who has  had bilateral knee pain for several years.  She had previous knee  arthroscopies years ago by Dr. Charlett Blake and was diagnosed with arthritis at  that time.  Over the past few months, she has had increasing pain with  ambulation.  Nonsteroidal anti-inflammatory medicines are no longer helping  her.  The pain is so bad she can barely walk, and she has elected to proceed  with a left total knee arthroplasty and was admitted to the hospital for  same.   LABORATORY DATA:  Admission CBC revealed a WBC of 8.3, RBC 4.50, hemoglobin  12.2, hematocrit 36.6, platelet count 339.  Admission PT 12.3, INR 0.9, PTT  27.  Admission chemistry revealed a sodium of 139, potassium 3.7, chloride  105, CO2 27, glucose slightly  elevated at 109, BUN 12, creatinine 1.1,  calcium 9.4, total protein 7.6, albumin 3.6.  Admission urinalysis revealed  that she had moderate hemoglobin on the dipstick, moderate leukocyte  esterase.  Microscopic exam revealed many epithelial cells, 3-6 WBCs per  high power field, rare bacteria, few yeasts.  The patient's blood type is A  positive with a negative antibody screen.   Preoperative chest x-ray revealed decreased lung volume in the right lower  lobe, segmental atelectasis.  Preoperative x-ray of the left knee revealed  marked osteoarthritis, particularly in the medial compartment; small  effusion.  Also, large osteophytes noted from the patella.  Postoperative x-  ray of the left knee revealed good position of the left total knee  prosthesis.  I do not see the patient's preadmission EKG on the record.   HOSPITAL COURSE:  The patient was admitted to [facility].  She was  taken to the operating room.  She underwent the above-stated procedure  without complication.  She tolerated the  procedure well and was allowed to  return to the recovery room and then to the orthopedic floor to continue her  postoperative care.  A Hemovac drain was placed at the time of surgery.  The  Hemovac drain was discontinued on postoperative day #1.  The patient was  placed on PCA analgesia for pain control.  The patient's pain was well-  controlled, and she was weaned over to oral analgesics by postoperative day  #2.  The patient's hemoglobin and hematocrit were following throughout her  hospitalization.  She did have a postoperative drop in her hemoglobin to  8.4, but that stabilized prior to discharge and she did not require blood  transfusion.   PT was consulted for gait training and ambulation.  The patient was able to  progress with the aid of the therapist and a walker.   On postoperative day #2, the patient spiked a postoperative temperature of  102.  At that time, a chest x-ray was  ordered as well as a urinalysis.  She  was started on IV Cipro.  The patient's temperature improved.  It was found  that she did have a low-grade UTI which was being treated with Cipro.   The patient continued to improve, and it was felt that she could safely be  discharged to home on August 23, 2004.   DISCHARGE MEDICATIONS:  1.  The patient is to resume her home medications.  2.  Percocet 10/650 one to two every four hours as needed for pain.  3.  Trinsicon one tablet three times daily.  4.  Coumadin per pharmacy protocol initially will be 7.5 mg daily.  5.  Robaxin 500 mg one every six hours as needed for spasm.   DIET:  As tolerated.   ACTIVITY:  Full weightbearing with walker.   WOUND CARE:  Daily dressing changes to left knee.   SPECIAL INSTRUCTIONS:  Genevieve Norlander for home care and monitoring of PT and INR.   FOLLOW UP:  The patient is scheduled to follow up with Dr. Darrelyn Hillock two weeks  from the date of surgery, and she will call to schedule the appointment.      LKP/MEDQ  D:  09/16/2004  T:  09/16/2004  Job:  [id number]

## 2010-12-16 NOTE — H&P (Signed)
[name], [name] NO.:  [id number]   MEDICAL RECORD NO.:  [id number]          PATIENT TYPE:  INP   LOCATION:  NA                           FACILITY:  [facility]   PHYSICIAN:  Georges Lynch. Gioffre, M.D.DATE OF BIRTH:  [birth year]   DATE OF ADMISSION:  DATE OF DISCHARGE:                                HISTORY & PHYSICAL   HISTORY:  The patient has had bilateral knee pain for several years.  She  had previous knee arthroscopy years ago by Dr. Charlett Blake and diagnosed with  arthritis at that time.  Over the past few months she has had increasing  pain with ambulation.  NSAIDs are not helping her.  Pain is so bad she can  barely walk and she has elected to proceed with a left total knee  arthroplasty.   PAST MEDICAL HISTORY:  1.  High cholesterol.  2.  Arthritis.  3.  Hypertension.  4.  Previous UTI.  5.  Previous pneumonia.   The patient's primary care physician is Holley Bouche.   ALLERGIES:  None known.   CURRENT MEDICATIONS:  1.  Verapamil 240 mg daily.  2.  Diclofenac 75 mg b.i.d.  3.  Crestor 10 mg daily.   PREVIOUS SURGICAL HISTORY:  1.  The patient has had appendectomy in 1978.  2.  Knee arthroscopy in 1994 on the left.  3.  Thyroid cyst removed in 1997.  4.  Surgery on the right eye in 2005.   FAMILY HISTORY:  Significant for coronary artery disease, hypertension,  diabetes, arthritis.   REVIEW OF SYSTEMS:  GENERAL:  Denies weight change, fever, chills, fatigue.  HEENT:  Denies headache, visual changes, sore throat.  CARDIOVASCULAR:  Denies chest pain, palpitations, shortness of breath, orthopnea.  PULMONARY:  Denies dyspnea, wheezing, cough, sputum production, hemoptysis.  GASTROINTESTINAL:  Denies dysphagia, nausea, vomiting, hematemesis, or  abdominal pain.  GENITOURINARY:  Denies dysuria, frequency, urgency,  hematuria.  ENDOCRINE:  Denies polyuria, polydipsia, appetite changes, or  cold intolerance.  MUSCULOSKELETAL:  The patient has bilateral  knee pain.  NEUROLOGIC:  Denies dizziness, vertigo, syncope, seizures.  SKIN:  Denies  itching rashes, or moles.   PHYSICAL EXAMINATION:  VITAL SIGNS:  Temperature is 98.9, pulse of 72,  respirations 18, blood pressure 124/70.  GENERAL:  A 65 year old female in no acute distress.  HEENT:  PERRL, EOMs intact.  Pharynx clear.  NECK:  Supple without masses.  CHEST:  Clear to auscultation bilaterally.  No wheezing, rales, or rhonchi  noted.  HEART:  Regular rate and rhythm without murmur.  ABDOMEN:  Positive bowel sounds, soft, nontender.  She is obese.  EXTREMITIES:  Examination of her left knee reveals a flexion contracture.  She has swelling and painful motion.  SKIN:  Warm and dry.   X-ray of her left knee reveals severe degenerative arthritis of the left  knee.   IMPRESSION:  Severe degenerative arthritis of left knee.   PLAN:  The patient is to be admitted to [facility] for a left  total knee arthroplasty on August 19, 2004.      LKP/MEDQ  D:  08/18/2004  T:  08/18/2004  Job:  [id number]

## 2010-12-16 NOTE — Op Note (Signed)
[name], [name] NO.:  [id number]   MEDICAL RECORD NO.:  [id number]          PATIENT TYPE:  INP   LOCATION:  0002                         FACILITY:  [facility]   PHYSICIAN:  Georges Lynch. Gioffre, M.D.DATE OF BIRTH:  [birth year]   DATE OF PROCEDURE:  08/19/2004  DATE OF DISCHARGE:                                 OPERATIVE REPORT   SURGEON:  Georges Lynch. Darrelyn Hillock, M.D.   ASSISTANT:  Madlyn Frankel. Charlann Boxer, M.D. and Ebbie Ridge. Paitsel, P.A.   PREOPERATIVE DIAGNOSES:  Severe degenerative arthritis of the left knee.   POSTOPERATIVE DIAGNOSES:  Severe degenerative arthritis of the left knee.   OPERATION:  Left total knee arthroplasty.   COMPONENTS USED:  Osteonics component, all three components were cemented.  The patella was a size 26 recessed patella, the tibial tray was a size 9  tibial tray with an 80 mm length tibial extension.  The fluted stem was 19  mm x 80 mm.  The femoral component was a size 9 posterior cruciate  sacrificing type prosthesis.  The insert was a Scorpio flex tibial insert 15  mm thick.   DESCRIPTION OF PROCEDURE:  Under general anesthesia, routine orthopedic prep  and draping of the left lower extremity was carried out.  An incision was  made over the anterior aspect of the left knee, bleeders identified and  cauterized.  Prior to doing this, we exsanguinated the leg with esmarch and  the tourniquet was elevated at 400 mmHg.  An incision was made as I  mentioned over the anterior aspect of the left knee, bleeders identified and  cauterized.  Self retaining retractors were inserted.  I then carried out a  median parapatellar incision and reflected the patella laterally. The  patella was severely deformed and had multiple spurs. They were all removed  down to the size of a normal patella.  Following that, I removed all the  spurs from the tibia and femur. I then carried out medial and lateral  meniscectomies. I released the medial compartment posteriorly as  well. At  this particular point, the knee was flexed and a drill hole was made in the  intercondylar notch. Following this, the #1 jig was inserted for a size 9  left femur and we made our initial distal cut and removed 12 mm thickness of  the distal femur. At that particular point, I then inserted my #1 jig for a  size 9 left posterior cruciate sacrificing prosthesis. I carried out my  anterior, posterior and chamfering cuts. Following this, a tibial cut was  then made utilizing intramedullary guide rods with a #4 stylette. We removed  the appropriate amount of tibial plateau.  After this was done, I cut my  patella groove cut and notch cut out of the femur in the usual fashion.  I  removed all the synovium, did a nice synovectomy as well.  I then wen  through my trials and we selected a size 9 tibial tray, a size 9 left  femoral component, posterior cruciate sacrificing type.  I also utilized a  15 mm flexed tibial insert and we  had excellent stability.  Following this,  we then cut the patella, we removed 10 mm thickness off the patella and we  utilized a recessed patella and three drill holes were made in the patella.  At this particular time, we thoroughly irrigated out the area and then  flexed our knee and cut our keel cut out in the usual fashion and then we  reamed the proximal tibia to a size 19 mm diameter 80 mm length fluted stem.  We thoroughly irrigated the knee, dried the knee out and went through all  the trials.  Following  this, we then cemented all three components in simultaneously. The patella  was a size 26 patella as I mentioned. All loose pieces of cement were  removed.  I then closed the knee in layers in the usual fashion, skin was  closed with metal staples. A sterile neosporin dressing was applied. The  patient had 1 g of IV Ancef preop.      RAG/MEDQ  D:  08/19/2004  T:  08/19/2004  Job:  [id number]

## 2010-12-16 NOTE — Procedures (Signed)
NAMEKATE, LAROCK NO.:  [id number]   MEDICAL RECORD NO.:  [id number]          PATIENT TYPE:  OUT   LOCATION:  SLEEP CENTER                 FACILITY:  [facility]   PHYSICIAN:  Clinton D. Maple Hudson, M.D. DATE OF BIRTH:  May 29, 1946   DATE OF STUDY:  08/05/2004                              NOCTURNAL POLYSOMNOGRAM   INDICATIONS FOR STUDY:  Hypersomnia with sleep apnea. Epworth sleepiness  score 13/24, BMI 45, weight 298 pounds.   SLEEP ARCHITECTURE:  Total sleep time 354 minutes with sleep efficiency of  78%. Stage I was 6%, stage II was 59%, stages III and IV were 12%, REM was  22% of total sleep time. Latency to sleep onset 17 minutes. Latency to REM  262 minutes. Awake after sleep onset 74 minutes. Arousal index 43.   RESPIRATORY DATA:  Split study protocol. RDI 138.9 obstructive events per  hour indicating very severe obstructive sleep apnea/hypopnea syndrome. This  included 158 obstructive apneas, 8 mixed apneas, and 128 hypopneas before  CPAP. Events were not positional. REM RDI 27 per hour. CPAP was titrated to  14 CWP, RDI 0 per hour. A Resmed Swift with medium nasal pillows was used  with a heated humidifier.   OXYGEN DATA:  Very loud snoring with oxygen desaturation to a nadir of 81%  per CPAP. After CPAP titration, oxygen saturation held 95% to 98% on room  air.   CARDIAC DATA:  Normal cardiac rhythm.   MOVEMENT/PARASOMNIA:  Insignificant.   IMPRESSION/RECOMMENDATIONS:  Very severe obstructive sleep apnea/hypopnea  syndrome, RDI 138.9 obstructive events per hour with oxygen desaturation to  81%. CPAP was successfully titrated to 14 CWP, RDI 0 per hour, using a  Resmed Swift with medium nasal pillows and a heated humidifier.                                                           Clinton D. Maple Hudson, M.D.  Diplomate, [employer] Board   CDY/MEDQ  D:  08/14/2004 11:02:20  T:  08/14/2004 12:00:59  Job:  [id number]

## 2010-12-16 NOTE — Op Note (Signed)
NAMEJENNILEE, [name] NO.:  [id number]   MEDICAL RECORD NO.:  [id number]          PATIENT TYPE:  AMB   LOCATION:  SDC                           FACILITY:  [facility]   PHYSICIAN:  Miguel Aschoff, M.D.       DATE OF BIRTH:  October 09, 1945   DATE OF PROCEDURE:  10/04/2005  DATE OF DISCHARGE:                                 OPERATIVE REPORT   PREOPERATIVE DIAGNOSIS:  Postmenopausal bleeding.   POSTOPERATIVE DIAGNOSIS:  Postmenopausal bleeding with endometrial polyp.   PROCEDURE:  Cervical dilatation, hysteroscopy, uterine curettage and  polypectomy.   SURGEON:  Dr. Miguel Aschoff   ANESTHESIA:  General.   COMPLICATIONS:  None.   JUSTIFICATION:  The patient is a 65 year old black female with onset of  postmenopausal bleeding. The patient initially had been evaluated in the  office with endometrial biopsy which only revealed a scant amount of tissue.  However ultrasound examination revealed thickened endometrium and it was  felt that the tissue specimen was not adequate to assess what was causing  the postmenopausal bleeding. She was therefore brought to the operating room  today to undergo hysteroscopy D&C in effort to identify the source of  bleeding, correct it, and to rule out any endometrial neoplasia. Risks,  benefits of procedure were discussed with the patient.   PROCEDURE:  The patient was taken to the operating room, placed in supine  position and general anesthesia was administered without difficulty. Placed  in dorsolithotomy position, prepped and draped in usual sterile fashion.  Examination under anesthesia revealed normal external genitalia, normal  Bartholin's and Skene's glands, normal urethra. Vaginal vault was without  gross lesion. The cervix was noted to be presenting to the vaginal orifice  with marked descensus. Cervix was without gross lesion. The uterus was  retroflexed, top normal size with some masses were noted. At this point the  endocervical canal  was dilated after the cervix had been grasped with a  tenaculum. It was dilated until #25 Pratt dilator could be passed. Then  diagnostic hysteroscope was advanced through the endocervical canal. No  lesions were noted in the endocervical canal. On entering the endometrial  cavity there appeared to be posterior polyp present just at the junction of  the endocervix and endometrial cavities. The rest of the endometrial cavity  was unremarkable. At this point the hysteroscope was removed and a small  serrated curette was placed into uterine cavity and the cavity vigorously  curetted and polyp removed without difficulty. Curettings were collected as  well as polyp and sent for histologic study. At this point the procedure was  completed. Prior to completing the procedure the cervix was injected with 18  mL of 1% Xylocaine by placing 6 mL at the 12, 4, and 8 o'clock positions.  Estimated blood loss was less than 20 mL. The patient tolerated the  procedure well. Plan is for the patient to be discharged home.   MEDICATIONS FOR HOME:  Darvocet N 100 one every 4 hours as needed for pain.   She will be seen back in 4 weeks for follow-up examination. She is to  call  for any problems such as fever, pain or heavy bleeding and she is to call  for pathology report on 10/06/2005.      Miguel Aschoff, M.D.  Electronically Signed     AR/MEDQ  D:  10/04/2005  T:  10/04/2005  Job:  [id number]

## 2010-12-16 NOTE — H&P (Signed)
NAMEKALENE, CUTLER NO.:  [id number]   MEDICAL RECORD NO.:  [id number]          PATIENT TYPE:  INP   LOCATION:  NA                           FACILITY:  [facility]   PHYSICIAN:  Georges Lynch. Gioffre, M.D.DATE OF BIRTH:  02-Jun-1946   DATE OF ADMISSION:  05/24/2005  DATE OF DISCHARGE:                                HISTORY & PHYSICAL   CHIEF COMPLAINT:  Painful right knee.   HISTORY OF PRESENT ILLNESS:  The patient is a 65 year old black female with  a history of left total knee arthroplasty early 2006 with good results.  She  has continued to have problems with her right knee with pain with range of  motion and deformity.  This bothers her with any type of weightbearing  activity.  X-rays reveal severe end-stage osteoarthritis with 20% degree  valgus deformity, bone-on-bone medial compartment with large osteophytes  throughout.   ALLERGIES:  No known drug allergies.   CURRENT MEDICATIONS:  1.  Verapamil ER 240 mg p.o. daily.  2.  Lovastatin 40 mg p.o. daily.  3.  Diclofenac 75 mg p.o. twice daily.   PAST MEDICAL HISTORY:  1.  Hypertension.  2.  History of sickle cell trait.  3.  History of intermittent amenia corrected with iron.  4.  History of left total knee arthroplasty in January 2006.   PAST SURGICAL HISTORY:  1.  Left total knee arthroplasty.  2.  Appendectomy.  3.  Eye surgery.  4.  Thyroid surgery.   The patient states that she has had no complications with anesthesia with  any previous surgery with the exception of alopecia related to surgical  anesthesia medications or stress with each progressive surgical procedure.   SOCIAL HISTORY:  The patient is a 65 year old, heavyset black female, has  lost 30 pounds since last surgical procedure and is now 272 pounds.  She is  currently employed as a Hydrographic surveyor, married, 2 children, lives in a house.  Denies any alcohol or tobacco use.   FAMILY MEDICAL HISTORY:  Mother is alive at 30 years of age with  hypercholesterolemia, hypertension, and diabetes.  Father deceased at 61  years of age from cardiac disease.   REVIEW OF SYSTEMS:  Positive for the sickle cell trait without any related  complaints.  She doe shave history of iron-deficiency anemia in the past but  not over the last year.  She does note some increased issues with thirst but  has been evaluated multiple times for diabetes which is negative.  She does  have increased urinary frequency at night.  She does have occasional  shortness of breath with exertion, but this has improved since she lost 30  pounds.  She does have a history of pneumonia in 2004.  Otherwise, Review of  Systems is negative.   PHYSICAL EXAMINATION:  GENERAL: The patient is a healthy-appearing,  pleasant, heavyset, 65 year old black female who appears to be in no  distress.  She does have obvious valgus deformity of her right lower  extremity with ambulating.  HEENT:  Head normocephalic.  Pupils equal, round, and reactive. Extraocular  movements intact.  External ears without deformity.  Gross hearing is  intact.  NECK: Supple. No palpable lymphadenopathy. Thyroid region was nontender.  She had good range of motion of her cervical spine.  CHEST: Lung sounds were clear and equal bilaterally. No wheezes, rales,  rhonchi.  HEART: Regular rate and rhythm. No murmurs, rubs, or gallops.  ABDOMEN:  Round, soft, obese like.  Bowel sounds were normoactive.  EXTREMITIES:  Lower extremities symmetric size and shape.  She had good  range of motion of her shoulders, elbows, wrists, strength 5/5.  Lower extremities right and left hip had full extension and flexion up to  120 degrees with good internal and external rotation without any discomfort.  Left knee had a well-healed midline surgical incision.  No erythema, no  ecchymosis, no effusion.  She had full extension, flexed to back easily to  110 degrees.  She did have some slight valgus to varus laxity, but the  joint  was not unstable.  The calf was nontender.  Right knee was round, boggy  appearing.  She had an obvious 20 degree deformity with extension which was  full.  She was able to flex it back to 90 degrees.  She did have some valgus  varus laxity about 10 degrees.  She had no instability. She was tender along  the mediolateral joint line.  No effusions, no erythema.  Calf was soft and  nontender.  Distal legs had symmetrical ankles with good dorsiflexion and  plantar flexion.  PERIPHERAL VASCULAR: Carotid pulses were 2+.  Radial pulses were 2+.  Unable  to palpate any lower extremity pulses, but her skin was warm, dry, intact,  blanched briskly.  She had 2+ pitting edema in bilateral lower extremities.  NEUROLOGIC: The patient was conscious, alert, appropriate, easy conversation  with examiner.  Cranial nerves II-XII grossly intact.  She had no gross  neurologic defects noted.  BREASTS/RECTAL/GU: Exams were deferred at this time.   IMPRESSION:  1.  End-stage osteoarthritis with valgus deformity, right knee.  2.  History of left total knee arthroplasty.  3.  History of hypertension.  4.  History of sickle cell trait.  5.  History of iron-deficiency anemia.   PLAN:  The patient will undergo all the routine and tests prior to right  total knee arthroplasty by Dr. Darrelyn Hillock at [facility] on May 24, 2005.     Jamelle Rushing, P.A.    ______________________________  Georges Lynch Darrelyn Hillock, M.D.   RWK/MEDQ  D:  05/15/2005  T:  05/15/2005  Job:  [id number]

## 2010-12-16 NOTE — Discharge Summary (Signed)
[name] [name], [name] NO.:  [name]   MEDICAL RECORD NO.:  [id number]          PATIENT TYPE:  INP   LOCATION:  1507                         FACILITY:  [facility]   PHYSICIAN:  [name] [name]. [name] [name], M.D.DATE OF BIRTH:  [birth year]   DATE OF ADMISSION:  05/24/2005  DATE OF DISCHARGE:  05/29/2005                                 DISCHARGE SUMMARY   ADMISSION DIAGNOSES:  1.  End-stage osteoarthritis, right knee.  2.  Hypertension.  3.  History of sickle cell trait.  4.  History of iron deficiency anemia.  5.  History of sleep apnea.  6.  Obesity.   DISCHARGE DIAGNOSES:  1.  Right total knee arthroplasty.  2.  Postoperative blood loss anemia improved with 2 units of packed red      blood cells.  3.  Fever unknown origin, resolved.  4.  History of hypertension.  5.  History of sickle cell trait.  6.  History of iron deficiency anemia.  7.  History of sleep apnea.  8.  History of obesity.   HISTORY OF PRESENT ILLNESS:  The patient is a 65 year old female who long-  term history with Dr. Darrelyn [name] has been having problems with her right knee  with pain and difficulty and deformity. Bothers her with any type of  weightbearing activities. X-rays reveal severe end-stage osteoarthritis with  a 20 degree valgus deformity, bone on bone medial compartment, large  osteophytes throughout.   ALLERGIES:  No known drug allergies   CURRENT MEDICATIONS ON ADMISSION:  1.  Verapamil ER 240 mg daily.  2.  Lovastatin 40 mg daily.  3.  Diclofenac 75 mg twice a day.   PROCEDURE:  On May 24, 2005, the patient was taken to the [name] by Dr. Worthy [name], assisted by [name] [name], P.A.-C. under general anesthesia. The patient  underwent a right total knee arthroplasty with DePuy rotating platform  system. A size 4 femoral component, a size 5 tibial tray, a size 4, 15 mm  polyethylene bearing, 38 mm patella peg. The components were in place with  polymethyl methacrylate and vancomycin mixed  in. The patient tolerated the  procedure well and was transferred to the recovery room and then to the  orthopedic floor for routine total knee protocol.   CONSULTATIONS:  The following routine consults requested:  Physical therapy,  rehab, case management.   HOSPITAL COURSE:  On May 24, 2005, the patient was admitted to [name] under the care of Dr. Ranee [name]. The patient was taken to  the [name] where a right total knee arthroplasty was performed without any  complications. The patient was transferred to the recovery room and to the  orthopedic floor in good condition. IV pain medicines, antibiotics, Coumadin  for DVT prophylaxis with heparin subcu. The patient was started on total  knee protocol.   The patient then incurred a total of 5 days postoperative care on the  orthopedic floor in which the patient did develop some postoperative blood  loss anemia. Her hemoglobin dropped to 8.4, she was slightly symptomatic  with a history of hypertension, obesity,  COPD and sleep apnea so she was  typed, crossed and transfused 2 units of packed red blood cells with  improvement of her hemoglobin to 9.3. The patient's vital signs after that  point were able to be tolerated and so no further transfusions were  required. The patient did develop some temps about 101 to 101.8 over a 2 day  period. This was worked up and no specific source was noted. On the 29th of  October, chest x-ray showed some cardiac enlargement but no acute changes.  The patient did not have any issues with oxygenation. This gradually  improved over several days and no further issues were encountered with that.   The patient had several episodes where she felt like her heart was  fluttering and racing away and repeat EKG showed that she had some normal  sinus rhythms without any irregular beats. The patient indicated that she  has had this in the past and no specific origins were determined.   The  patient's wound remained without any signs of infection, leg remained  neuromotor vascularly intact. She was able to transition off the V  medications over several days and was well maintained on p.o. meds on  postoperative day #5. Arrangements were made. The patient was felt to be  medically and orthopedically stable and ready for transfer home. She was  discharged home in good condition with outpatient home health physical  therapy and RN for Coumadin monitoring.   LABORATORY DATA:  H&H on October 29 was 9.3 and 27.3. INR on October 30 was  2.0, routine chemistries on admission, sodium of 142, potassium of 4.3,  glucose 93, BUN 13, creatinine 1.3. The patient received a total of 2 units  of packed red blood cells. EKG on October 29th was normal sinus rhythm at 93  beats per minute.   Chest x-ray taken on October 29th showed some cardiac enlargement, no acute  changes.   DISCHARGE MEDICATIONS:  1.  Colace 100 mg p.o. b.i.d.  2.  Trinsicon 1 tablet p.o. t.i.d.  3.  Laxative [name] enema of choice p.r.n.  4.  Percocet 1 [name] 2 tablets every 4-6 h p.r.n.  5.  Robaxin 500 mg p.o. q.6 h p.r.n.  6.  Reglan 10 mg p.o. q.6 h p.r.n.  7.  Verapamil 240 mg p.o. daily.  8.  Mevacor 20 mg daily.   DISCHARGE INSTRUCTIONS:  1.  Diet no restrictions.  2.  Activity - patient may walk with assistance and with the use of a      walker.  3.  Wound care - the patient is to change dressing daily and check for any      signs of infection.  4.  Medications - the patient is to continue with routine home meds with the      addition of the following.  5.  Percocet - 5 mg, 1 [name] 2 tablets every 4-6 h for pain if needed.  6.  Robaxin 500 mg 1 tablet every 6 h for muscle spasms if needed.  7.  Coumadin 7.5 mg daily unless changed by pharmacist.  8.  Followup - the patient needs a followup appointment with Dr. Darrelyn [name] in     his office in 2 weeks, patient is to call for an appointment, [phone].   SPECIAL  INSTRUCTIONS:  Genevieve Norlander is to draw PT/INR on the first date of  October 31.   CONDITION ON DISCHARGE:  Improved and good.      Molly [name]  Diamantina Providence, P.A.    ______________________________  [name] [name] [name] [name], M.D.    RWK/MEDQ  D:  06/14/2005  T:  06/14/2005  Job:  [id number]

## 2011-01-27 ENCOUNTER — Encounter: Payer: Self-pay | Admitting: Pulmonary Disease

## 2011-01-30 ENCOUNTER — Encounter: Payer: Self-pay | Admitting: Pulmonary Disease

## 2011-01-30 ENCOUNTER — Ambulatory Visit (INDEPENDENT_AMBULATORY_CARE_PROVIDER_SITE_OTHER): Payer: Medicare Other | Admitting: Pulmonary Disease

## 2011-01-30 VITALS — BP 148/82 | HR 85 | Temp 98.6°F | Ht 67.0 in | Wt 303.8 lb

## 2011-01-30 DIAGNOSIS — G4733 Obstructive sleep apnea (adult) (pediatric): Secondary | ICD-10-CM | POA: Insufficient documentation

## 2011-01-30 NOTE — Assessment & Plan Note (Signed)
The pt has a h/o very severe osa, and has been noncompliant with cpap in the past.  She recently began to use more consistently because of concern over her health, and her machine malfunctioned.  She is due for a new machine.  Will go ahead and arrange for a replacement machine, but will also re-optimize her pressure on auto mode.  I have also encouraged her to work aggressively on weight loss.

## 2011-01-30 NOTE — Patient Instructions (Signed)
Will get you a new cpap machine, and set on auto for the first few weeks to optimize your cpap pressure.  Will call you once results are available. Work on weight loss followup with me in 2mos, but call if having issues with tolerance.

## 2011-01-30 NOTE — Progress Notes (Signed)
  Subjective:    Patient ID: [name], female    DOB: 06/09/1946, 65 y.o.   MRN: [id number]  HPI The pt is a 65y/o female who comes in today as a self referral for management of osa.  She was diagnosed with very severe osa in 2006, with AHI 139/hr.  She has been started on cpap, but admits that she has hardly ever used.  Most recently she began to use her device again because of gasping arousals, and then it quit working.  She is in need of a new machine.  She was recently given a new cpap mask by her dme, and has done well with this.  The pt has had loud snoring, gasping arousals, and nonrestorative sleep.  She has significant EDS with inactivity, and takes an afternoon nap daily.  She can doze easily watching tv in the evening, but denies any issues with driving.  Her weight is up about 10-15 pounds per pt over the last 2 yrs.  Her epworth score today is 10.   Sleep Questionnaire: What time do you typically go to bed?( Between what hours) 11pm to 12 am How long does it take you to fall asleep? 1/2 to 1 hour How many times during the night do you wake up? 2 What time do you get out of bed to start your day? 4098 Do you drive or operate heavy machinery in your occupation? No How much has your weight changed (up or down) over the past two years? (In pounds) 10 lb (4.536 kg) Have you ever had a sleep study before? Yes If yes, location of study? [facility] If yes, date of study? 2006 Do you currently use CPAP? Yes If so, what pressure? ? Do you wear oxygen at any time? No      Review of Systems  Constitutional: Negative for fever and unexpected weight change.  HENT: Negative for ear pain, nosebleeds, congestion, sore throat, rhinorrhea, sneezing, trouble swallowing, dental problem, postnasal drip and sinus pressure.   Eyes: Negative for redness and itching.  Respiratory: Negative for cough, chest tightness, shortness of breath and wheezing.   Cardiovascular: Positive for leg swelling. Negative for  palpitations.  Gastrointestinal: Negative for nausea and vomiting.  Genitourinary: Negative for dysuria.  Musculoskeletal: Positive for joint swelling.  Skin: Negative for rash.  Neurological: Positive for headaches.  Hematological: Does not bruise/bleed easily.  Psychiatric/Behavioral: Negative for dysphoric mood. The patient is not nervous/anxious.        Objective:   Physical Exam Constitutional:  Morbidly obese female, no acute distress  HENT:  Nares patent without discharge, but turbinate hypertrophy noted.  Oropharynx without exudate, palate and uvula are moderately elongated.  Eyes:  Perrla, eomi, no scleral icterus  Neck:  No JVD, no TMG, fleshy neck  Cardiovascular:  Normal rate, regular rhythm, no rubs or gallops.  2/6 murmur        Intact distal pulses  Pulmonary :  Normal breath sounds, no stridor or respiratory distress   No rales, rhonchi, or wheezing  Abdominal:  Soft, nondistended, bowel sounds present.  No tenderness noted.   Musculoskeletal:  2+ lower extremity edema noted.  Lymph Nodes:  No cervical lymphadenopathy noted  Skin:  No cyanosis noted  Neurologic:  Appears sleepy, appropriate, moves all 4 extremities without obvious deficit.         Assessment & Plan:

## 2011-02-13 ENCOUNTER — Encounter: Payer: Self-pay | Admitting: Pulmonary Disease

## 2011-04-04 ENCOUNTER — Ambulatory Visit (INDEPENDENT_AMBULATORY_CARE_PROVIDER_SITE_OTHER): Payer: Medicare Other | Admitting: Pulmonary Disease

## 2011-04-04 ENCOUNTER — Encounter: Payer: Self-pay | Admitting: Pulmonary Disease

## 2011-04-04 VITALS — BP 122/64 | HR 67 | Temp 98.2°F | Ht 67.0 in | Wt 298.8 lb

## 2011-04-04 DIAGNOSIS — G4733 Obstructive sleep apnea (adult) (pediatric): Secondary | ICD-10-CM

## 2011-04-04 NOTE — Progress Notes (Signed)
  Subjective:    Patient ID: [name], female    DOB: [birth year], 65 y.o.   MRN: [id number]  HPI The patient comes in today for follow up of her known very severe sleep apnea.  At the last visit she was started on a new CPAP machine, and placed on automatic mode for a few weeks to determine her optimal pressure.  Unfortunately, her compliance was not the best, and she only used for 50% of the nights.  She denies any issues with her mask fit or pressure, and is much happier with this device.  I have stressed to her the importance of improved compliance, so that we also can optimize her pressure.   Review of Systems  Constitutional: Negative for fever and unexpected weight change.  HENT: Negative for ear pain, nosebleeds, congestion, sore throat, rhinorrhea, sneezing, trouble swallowing, dental problem, postnasal drip and sinus pressure.   Eyes: Positive for itching. Negative for redness.  Respiratory: Negative for cough, chest tightness, shortness of breath and wheezing.   Cardiovascular: Positive for leg swelling. Negative for palpitations.  Gastrointestinal: Negative for nausea and vomiting.  Genitourinary: Negative for dysuria.  Musculoskeletal: Negative for joint swelling.  Skin: Positive for rash.       Rash on throat x 2 months  Neurological: Positive for headaches.  Hematological: Does not bruise/bleed easily.  Psychiatric/Behavioral: Negative for dysphoric mood. The patient is nervous/anxious.        Objective:   Physical Exam Moderately obese female in no acute distress no skin breakdown or pressure necrosis from the CPAP mask Lower extremities with 1+ edema, no cyanosis noted Awake, but appears mildly sleepy, moves all 4 extremities.       Assessment & Plan:

## 2011-04-04 NOTE — Patient Instructions (Signed)
Wear cpap everynight, and work on weight loss I will have your dme download your machine in 2 weeks, so we can figure out your optimal pressure.   followup with me in 6mos, but call if having issues with cpap.

## 2011-04-04 NOTE — Assessment & Plan Note (Signed)
The patient has very severe sleep apnea, but at least is trying to be compliant with CPAP.  She denies any mask or pressure issues, and I have asked her to be very compliant over the next few weeks so that we can get a proper reading on automatic setting.  Also encouraged her to work aggressively on weight loss.

## 2011-04-30 ENCOUNTER — Other Ambulatory Visit: Payer: Self-pay | Admitting: Pulmonary Disease

## 2011-04-30 DIAGNOSIS — G4733 Obstructive sleep apnea (adult) (pediatric): Secondary | ICD-10-CM

## 2011-05-11 ENCOUNTER — Other Ambulatory Visit: Payer: Self-pay | Admitting: Obstetrics and Gynecology

## 2011-05-11 DIAGNOSIS — Z1231 Encounter for screening mammogram for malignant neoplasm of breast: Secondary | ICD-10-CM

## 2011-07-13 ENCOUNTER — Ambulatory Visit
Admission: RE | Admit: 2011-07-13 | Discharge: 2011-07-13 | Disposition: A | Discharge status: Home / Self Care | Payer: Medicare Other | Source: Ambulatory Visit | Attending: Obstetrics and Gynecology | Admitting: Obstetrics and Gynecology

## 2011-07-13 DIAGNOSIS — Z1231 Encounter for screening mammogram for malignant neoplasm of breast: Secondary | ICD-10-CM

## 2011-07-15 ENCOUNTER — Encounter (HOSPITAL_COMMUNITY): Payer: Self-pay | Admitting: *Deleted

## 2011-07-15 ENCOUNTER — Emergency Department (HOSPITAL_COMMUNITY): Payer: Medicare Other

## 2011-07-15 ENCOUNTER — Emergency Department (HOSPITAL_COMMUNITY)
Admission: EM | Admit: 2011-07-15 | Discharge: 2011-07-15 | Disposition: A | Discharge status: Home / Self Care | Payer: Medicare Other | Attending: Emergency Medicine | Admitting: Emergency Medicine

## 2011-07-15 DIAGNOSIS — R059 Cough, unspecified: Secondary | ICD-10-CM | POA: Insufficient documentation

## 2011-07-15 DIAGNOSIS — R0602 Shortness of breath: Secondary | ICD-10-CM | POA: Insufficient documentation

## 2011-07-15 DIAGNOSIS — E785 Hyperlipidemia, unspecified: Secondary | ICD-10-CM | POA: Insufficient documentation

## 2011-07-15 DIAGNOSIS — R51 Headache: Secondary | ICD-10-CM | POA: Insufficient documentation

## 2011-07-15 DIAGNOSIS — I10 Essential (primary) hypertension: Secondary | ICD-10-CM | POA: Insufficient documentation

## 2011-07-15 DIAGNOSIS — R062 Wheezing: Secondary | ICD-10-CM | POA: Insufficient documentation

## 2011-07-15 DIAGNOSIS — J069 Acute upper respiratory infection, unspecified: Secondary | ICD-10-CM | POA: Insufficient documentation

## 2011-07-15 DIAGNOSIS — R05 Cough: Secondary | ICD-10-CM | POA: Insufficient documentation

## 2011-07-15 MED ORDER — ALBUTEROL SULFATE HFA 108 (90 BASE) MCG/ACT IN AERS: 1.0000 | INHALATION_SPRAY | Freq: Four times a day (QID) | RESPIRATORY_TRACT | Status: DC | PRN

## 2011-07-15 NOTE — ED Notes (Addendum)
Pt in c/o cough, congestion and body aches since Tuesday, intermittent fever, states she was seen Tuesday for same and started z-pack, no improvement

## 2011-07-15 NOTE — ED Provider Notes (Signed)
Patient relates she started getting a cough one week ago today. She states she's had low-grade fever and coughing up yellow/green sputum. She was seen at urgent care and has been on a Z-Pak and today is her fourth day. He states her cough is improving however she still has diffuse body aches. Patient states she had a flu shot prior to getting ill and states last year when she got her flu shot she got ill again.  Patient is alert and cooperative in no respiratory distress.  Medical screening examination/treatment/procedure(s) were conducted as a shared visit with non-physician practitioner(s) and myself.  I personally evaluated the patient during the encounter [name] Albe, MD, Franz Dell, MD 07/15/11 [phone]

## 2011-07-15 NOTE — ED Provider Notes (Signed)
History     CSN: [id number] Arrival date & time: 07/15/2011  4:19 PM   First MD Initiated Contact with Patient 07/15/11 1648      Chief Complaint  Patient presents with  . URI    (Consider location/radiation/quality/duration/timing/severity/associated sxs/prior treatment) HPI Comments: Patient reports that she has had a productive cough for one week and a fever for the past 5 days.  She was seen by Urgent Care on 4 days ago and was prescribed Azithromycin.  She is currently on day 4 of 5 of treatment.  She reports that her coughing has improved somewhat.  She continues to have a fever.  She also reports some chest pain, but reports that she only has the pain when she coughs, moves, or takes a deep breath.  She has also felt short of breath and has had intermittent wheezing.  She is concerned that she may have pneumonia.  She has had pneumonia four times in the past.  She does also have a headache, but reports that this is a chronic problem.  Headache no different than usual headache.  She has an appt to see a specialist on Monday.  Patient is a 65 y.o. female presenting with cough. The history is provided by the patient.  Cough This is a new problem. Episode onset: one week. The problem has been gradually improving. The cough is productive of sputum. The maximum temperature recorded prior to her arrival was 100 to 100.9 F. Associated symptoms include chills, headaches, shortness of breath and wheezing. Pertinent negatives include no ear pain, no rhinorrhea and no sore throat.    Past Medical History  Diagnosis Date  . OSA (obstructive sleep apnea)   . Hypertension   . Hyperlipidemia     Past Surgical History  Procedure Date  . Total knee arthroplasty     bilateral  . Appendectomy   . Thyroid surgery   . Polypectomy     Family History  Problem Relation Age of Onset  . Heart disease Father   . Clotting disorder Father     History  Substance Use Topics  . Smoking status:  Never Smoker   . Smokeless tobacco: Not on file  . Alcohol Use: Not on file    OB History    Grav Para Term Preterm Abortions TAB SAB Ect Mult Living                  Review of Systems  Constitutional: Positive for fever and chills. Negative for diaphoresis.  HENT: Negative for ear pain, congestion, sore throat, rhinorrhea, neck pain and neck stiffness.   Eyes: Negative for visual disturbance.  Respiratory: Positive for cough, shortness of breath and wheezing.   Cardiovascular: Negative for palpitations.  Gastrointestinal: Negative for nausea, vomiting, abdominal pain and diarrhea.  Skin: Negative for rash.  Neurological: Positive for headaches. Negative for dizziness, syncope and light-headedness.  Psychiatric/Behavioral: Negative for confusion.    Allergies  Lodine  Home Medications   Current Outpatient Rx  Name Route Sig Dispense Refill  . AZITHROMYCIN 250 MG PO TABS Oral Take 250 mg by mouth as directed. Course started 07/12/11 for 5 day supply....takes 2 tablets on day 1, then takes 1 tablet daily on days 2-5     . CLOPIDOGREL BISULFATE 75 MG PO TABS Oral Take 75 mg by mouth daily.      . OMEGA-3 FATTY ACIDS 1000 MG PO CAPS Oral Take 1 capsule by mouth 2 (two) times daily.      [name]  HYDROCODONE-ACETAMINOPHEN 10-325 MG PO TABS Oral Take 1 tablet by mouth every 8 (eight) hours as needed. Pain.    [name] LOSARTAN POTASSIUM-HCTZ 100-25 MG PO TABS Oral Take 1 tablet by mouth daily.      [name] NIACIN (ANTIHYPERLIPIDEMIC) 500 MG PO TBCR Oral Take 500 mg by mouth 2 (two) times daily.     [name] SIMVASTATIN 40 MG PO TABS Oral Take 40 mg by mouth at bedtime.      [name] VERAPAMIL HCL ER 360 MG PO CP24 Oral Take 360 mg by mouth daily.      . ALBUTEROL SULFATE HFA 108 (90 BASE) MCG/ACT IN AERS Inhalation Inhale 1-2 puffs into the lungs every 6 (six) hours as needed for wheezing. 1 Inhaler 0    BP 151/70  Pulse 82  Temp(Src) 99.2 F (37.3 C) (Oral)  Resp 20  SpO2 96%  Physical Exam  Nursing note  and vitals reviewed. Constitutional: [name], place, and time. She appears well-developed and well-nourished. No distress.  HENT:  Head: Normocephalic and atraumatic.  Right Ear: Tympanic membrane normal.  Left Ear: Tympanic membrane normal.  Nose: Nose normal. Right sinus exhibits no maxillary sinus tenderness and no frontal sinus tenderness. Left sinus exhibits no maxillary sinus tenderness and no frontal sinus tenderness.  Mouth/Throat: Oropharynx is clear and moist and mucous membranes are normal.  Eyes: EOM are normal. Pupils are equal, round, and reactive to light.  Neck: Normal range of motion. Neck supple.  Cardiovascular: Normal rate, regular rhythm and normal heart sounds.   Pulmonary/Chest: Effort normal. No accessory muscle usage. Not tachypneic. No respiratory distress. She has wheezes in the right upper field. She has no rhonchi. She has no rales.  Musculoskeletal: Normal range of motion.  Neurological: She is alert and oriented to person, place, and time.  Skin: Skin is warm and dry. She is not diaphoretic.  Psychiatric: She has a normal mood and affect.    ED Course  Procedures (including critical care time)  Labs Reviewed - No data to display Dg Chest 2 View  07/15/2011  *RADIOLOGY REPORT*  Clinical Data: Fever and cough  CHEST - 2 VIEW  Comparison: 07/11/2011  Findings: Mild cardiac enlargement.  No pleural effusion or pulmonary edema.  No airspace consolidation identified.  Review of the visualized osseous structures is significant for mild spondylosis.  IMPRESSION:  1.  No acute cardiopulmonary abnormalities.  Original Report Authenticated By: Rosealee Albee, M.D.     1. Upper respiratory infection       MDM  Negative CXR.  Feel that symptoms are most likely URI.  Patient is not hypoxic.  Patient instructed to finish current dose of antibiotic therapy.  Patient given a Rx for albuterol inhaler to help with cough and feeling short of breath.   Instructed patient to follow up with PCP.        Pascal Lux Wingen 07/16/11 1200

## 2011-07-16 NOTE — ED Provider Notes (Signed)
See prior note   Ward Givens, MD 07/16/11 1210

## 2011-07-20 ENCOUNTER — Other Ambulatory Visit: Payer: Self-pay | Admitting: Obstetrics and Gynecology

## 2011-07-20 DIAGNOSIS — R928 Other abnormal and inconclusive findings on diagnostic imaging of breast: Secondary | ICD-10-CM

## 2011-07-28 ENCOUNTER — Other Ambulatory Visit: Payer: Self-pay | Admitting: Neurology

## 2011-07-28 DIAGNOSIS — R0989 Other specified symptoms and signs involving the circulatory and respiratory systems: Secondary | ICD-10-CM

## 2011-08-01 DIAGNOSIS — C50919 Malignant neoplasm of unspecified site of unspecified female breast: Secondary | ICD-10-CM

## 2011-08-01 DIAGNOSIS — Z853 Personal history of malignant neoplasm of breast: Secondary | ICD-10-CM

## 2011-08-01 HISTORY — DX: Malignant neoplasm of unspecified site of unspecified female breast: C50.919

## 2011-08-01 HISTORY — DX: Personal history of malignant neoplasm of breast: Z85.3

## 2011-08-01 HISTORY — PX: BREAST LUMPECTOMY: SHX2

## 2011-08-02 ENCOUNTER — Ambulatory Visit
Admission: RE | Admit: 2011-08-02 | Discharge: 2011-08-02 | Disposition: A | Discharge status: Home / Self Care | Payer: Medicare Other | Source: Ambulatory Visit | Attending: Obstetrics and Gynecology | Admitting: Obstetrics and Gynecology

## 2011-08-02 ENCOUNTER — Other Ambulatory Visit: Payer: Self-pay | Admitting: Obstetrics and Gynecology

## 2011-08-02 DIAGNOSIS — R928 Other abnormal and inconclusive findings on diagnostic imaging of breast: Secondary | ICD-10-CM

## 2011-08-03 ENCOUNTER — Ambulatory Visit
Admission: RE | Admit: 2011-08-03 | Discharge: 2011-08-03 | Disposition: A | Discharge status: Home / Self Care | Payer: Medicare Other | Source: Ambulatory Visit | Attending: Obstetrics and Gynecology | Admitting: Obstetrics and Gynecology

## 2011-08-03 ENCOUNTER — Other Ambulatory Visit: Payer: Self-pay | Admitting: Obstetrics and Gynecology

## 2011-08-03 DIAGNOSIS — C50912 Malignant neoplasm of unspecified site of left female breast: Secondary | ICD-10-CM

## 2011-08-03 DIAGNOSIS — R928 Other abnormal and inconclusive findings on diagnostic imaging of breast: Secondary | ICD-10-CM

## 2011-08-04 ENCOUNTER — Ambulatory Visit
Admission: RE | Admit: 2011-08-04 | Discharge: 2011-08-04 | Disposition: A | Discharge status: Home / Self Care | Payer: Medicare Other | Source: Ambulatory Visit | Attending: Neurology | Admitting: Neurology

## 2011-08-04 DIAGNOSIS — R0989 Other specified symptoms and signs involving the circulatory and respiratory systems: Secondary | ICD-10-CM

## 2011-08-08 ENCOUNTER — Encounter (INDEPENDENT_AMBULATORY_CARE_PROVIDER_SITE_OTHER): Payer: Self-pay | Admitting: Surgery

## 2011-08-08 ENCOUNTER — Ambulatory Visit
Admission: RE | Admit: 2011-08-08 | Discharge: 2011-08-08 | Disposition: A | Discharge status: Home / Self Care | Payer: Medicare Other | Source: Ambulatory Visit | Attending: Obstetrics and Gynecology | Admitting: Obstetrics and Gynecology

## 2011-08-08 DIAGNOSIS — C50912 Malignant neoplasm of unspecified site of left female breast: Secondary | ICD-10-CM

## 2011-08-08 MED ORDER — GADOBENATE DIMEGLUMINE 529 MG/ML IV SOLN
20.0000 mL | Freq: Once | INTRAVENOUS | Status: AC | PRN
  Administered 2011-08-08: 20 mL via INTRAVENOUS

## 2011-08-10 ENCOUNTER — Encounter (INDEPENDENT_AMBULATORY_CARE_PROVIDER_SITE_OTHER): Payer: Self-pay | Admitting: Surgery

## 2011-08-10 ENCOUNTER — Other Ambulatory Visit (INDEPENDENT_AMBULATORY_CARE_PROVIDER_SITE_OTHER): Payer: Self-pay | Admitting: Surgery

## 2011-08-10 ENCOUNTER — Ambulatory Visit (INDEPENDENT_AMBULATORY_CARE_PROVIDER_SITE_OTHER): Payer: Medicare Other | Admitting: Surgery

## 2011-08-10 VITALS — BP 156/88 | HR 88 | Temp 97.8°F | Resp 18 | Ht 67.0 in | Wt 299.2 lb

## 2011-08-10 DIAGNOSIS — C50912 Malignant neoplasm of unspecified site of left female breast: Secondary | ICD-10-CM

## 2011-08-10 DIAGNOSIS — C50919 Malignant neoplasm of unspecified site of unspecified female breast: Secondary | ICD-10-CM

## 2011-08-10 NOTE — Progress Notes (Signed)
Patient ID: [name], female   DOB: Jun 23, 1946, 66 y.o.   MRN: [id number]  Chief Complaint  Patient presents with  . Other    Eval breast cancer    HPI [name] is a 66 y.o. female.   HPI This is a very pleasant female referred by Dr. Lenord Fellers for for evaluation of a recently diagnosed left breast cancer. This was found on screening mammography. She has had no previous problems with her breasts. She has had no previous biopsies. She denies nipple discharge. Family history is negative for breast cancer Past Medical History  Diagnosis Date  . OSA (obstructive sleep apnea)   . Hypertension   . Hyperlipidemia   . Cancer     breast    Past Surgical History  Procedure Date  . Total knee arthroplasty 2006    bilateral  . Thyroid surgery 1990's  . Polypectomy 1990's  . Appendectomy 1978    Family History  Problem Relation Age of Onset  . Heart disease Father   . Clotting disorder Father   . Stroke Father   . Stroke Mother   . Diabetes Mother     Social History History  Substance Use Topics  . Smoking status: Never Smoker   . Smokeless tobacco: Never Used  . Alcohol Use: No    Allergies  Allergen Reactions  . Lodine (Etodolac)     itching    Current Outpatient Prescriptions  Medication Sig Dispense Refill  . albuterol (PROVENTIL HFA;VENTOLIN HFA) 108 (90 BASE) MCG/ACT inhaler Inhale 1-2 puffs into the lungs every 6 (six) hours as needed for wheezing.  1 Inhaler  0  . azithromycin (ZITHROMAX) 250 MG tablet Take 250 mg by mouth as directed. Course started 07/12/11 for 5 day supply....takes 2 tablets on day 1, then takes 1 tablet daily on days 2-5       . clopidogrel (PLAVIX) 75 MG tablet Take 75 mg by mouth daily.        . fish oil-omega-3 fatty acids 1000 MG capsule Take 1 capsule by mouth 2 (two) times daily.        [name] HYDROcodone-acetaminophen (NORCO) 10-325 MG per tablet Take 1 tablet by mouth every 8 (eight) hours as needed. Pain.      [name]  losartan-hydrochlorothiazide (HYZAAR) 100-25 MG per tablet Take 1 tablet by mouth daily.        . niacin (NIASPAN) 500 MG CR tablet Take 500 mg by mouth 2 (two) times daily.       . simvastatin (ZOCOR) 40 MG tablet Take 40 mg by mouth at bedtime.        . verapamil (VERELAN PM) 360 MG 24 hr capsule Take 360 mg by mouth daily.          Review of Systems Review of Systems  Constitutional: Negative for fever, chills and unexpected weight change.  HENT: Negative for hearing loss, congestion, sore throat, trouble swallowing and voice change.   Eyes: Negative for visual disturbance.  Respiratory: Negative for cough and wheezing.        Cpap  Cardiovascular: Negative for chest pain, palpitations and leg swelling.  Gastrointestinal: Negative for nausea, vomiting, abdominal pain, diarrhea, constipation, blood in stool, abdominal distention and anal bleeding.  Genitourinary: Negative for hematuria, vaginal bleeding and difficulty urinating.  Musculoskeletal: Negative for arthralgias.  Skin: Negative for rash and wound.  Neurological: Negative for seizures, syncope and headaches.  Hematological: Negative for adenopathy. Does not bruise/bleed easily.  Psychiatric/Behavioral: Negative for  confusion.    Blood pressure 156/88, pulse 88, temperature 97.8 F (36.6 C), temperature source Temporal, resp. rate 18, height 5\' 7"  (1.702 m), weight 299 lb 3.2 oz (135.716 kg).  Physical Exam Physical Exam  Constitutional: [name], place, and time. She appears well-developed and well-nourished. No distress.       Morbidly obese female  HENT:  Head: Normocephalic and atraumatic.  Right Ear: External ear normal.  Left Ear: External ear normal.  Nose: Nose normal.  Mouth/Throat: Oropharynx is clear and moist. No oropharyngeal exudate.  Eyes: Conjunctivae are normal. Pupils are equal, round, and reactive to light. No scleral icterus.  Neck: Normal range of motion. Neck supple. No tracheal  deviation present. No thyromegaly present.       Well-healed incision on the neck from previous thyroidectomy  Cardiovascular: Normal rate, regular rhythm, normal heart sounds and intact distal pulses.   No murmur heard. Pulmonary/Chest: Effort normal and breath sounds normal. No respiratory distress. She has no wheezes. She has no rales.  Abdominal: Soft. Bowel sounds are normal. She exhibits no distension. There is no tenderness. There is no rebound.       Well-healed incision just to the right of the midline  Musculoskeletal: Normal range of motion. She exhibits no edema and no tenderness.  Lymphadenopathy:    She has no cervical adenopathy.    She has no axillary adenopathy.       Right: No supraclavicular adenopathy present.       Left: No supraclavicular adenopathy present.  Neurological: She is alert and oriented to person, place, and time. No cranial nerve deficit.  Skin: Skin is warm and dry. No rash noted. No erythema.  Psychiatric: Her behavior is normal. Judgment normal.  Breasts: Large bilaterally. No palpable masses in either breast.  Data Reviewed I have reviewed her mammograms and MRI. These demonstrate the lesion at the 2:00 position of the left breast. There are no other modalities. A biopsy shows invasive ductal carcinoma which is 100% ER and PR positive  Assessment    Invasive left breast cancer    Plan    I had a discussion with her regarding therapy. We discussed mastectomy versus breast conservation. I am recommending breast conservation with left breast needle localized lumpectomy and sentinel lymph node biopsy. I discussed this with her in detail. I discussed the risk of surgery which includes but is not limited to bleeding, infection, need for further surgery should margins or lymph nodes be positive, injury to surrounding structures, cardiopulmonary issues, DVT, etc. She understands and wishes to proceed. Likelihood of success is good. I will refer her to the  medical and radiation physicians postoperatively       [name] A 08/10/2011, 9:32 AM

## 2011-08-22 ENCOUNTER — Telehealth (INDEPENDENT_AMBULATORY_CARE_PROVIDER_SITE_OTHER): Payer: Self-pay

## 2011-08-22 NOTE — Telephone Encounter (Signed)
C/o of pain under left arm/breast where biopsy was done.  Sharp pain at times a level 6.  Patient advised not to lay on left side.  Patient to have surgery on 08/30/2011 for left lumpectomy and SL/bx.

## 2011-08-24 ENCOUNTER — Encounter (HOSPITAL_BASED_OUTPATIENT_CLINIC_OR_DEPARTMENT_OTHER): Payer: Self-pay | Admitting: *Deleted

## 2011-08-24 NOTE — Progress Notes (Signed)
Pt has severe osa-uses cpap- She is to bring cpap,overnight bag and all meds dos in case anesth wants her to stay overnight Having labs done 08/25/11 at dr Gean Birchwood fax results here Had cxr 12/12-ekg 4/12

## 2011-08-30 ENCOUNTER — Ambulatory Visit
Admission: RE | Admit: 2011-08-30 | Discharge: 2011-08-30 | Disposition: A | Discharge status: Home / Self Care | Payer: Medicare Other | Source: Ambulatory Visit | Attending: Surgery | Admitting: Surgery

## 2011-08-30 ENCOUNTER — Encounter (HOSPITAL_BASED_OUTPATIENT_CLINIC_OR_DEPARTMENT_OTHER): Payer: Self-pay | Admitting: *Deleted

## 2011-08-30 ENCOUNTER — Ambulatory Visit (HOSPITAL_BASED_OUTPATIENT_CLINIC_OR_DEPARTMENT_OTHER)
Admission: RE | Admit: 2011-08-30 | Discharge: 2011-08-30 | Disposition: A | Discharge status: Home / Self Care | Payer: Medicare Other | Source: Ambulatory Visit | Attending: Surgery | Admitting: Surgery

## 2011-08-30 ENCOUNTER — Other Ambulatory Visit (INDEPENDENT_AMBULATORY_CARE_PROVIDER_SITE_OTHER): Payer: Self-pay | Admitting: Surgery

## 2011-08-30 ENCOUNTER — Encounter (HOSPITAL_BASED_OUTPATIENT_CLINIC_OR_DEPARTMENT_OTHER): Payer: Self-pay | Admitting: Anesthesiology

## 2011-08-30 ENCOUNTER — Encounter (HOSPITAL_BASED_OUTPATIENT_CLINIC_OR_DEPARTMENT_OTHER)
Admission: RE | Disposition: A | Discharge status: Home / Self Care | Payer: Self-pay | Source: Ambulatory Visit | Attending: Surgery

## 2011-08-30 ENCOUNTER — Ambulatory Visit (HOSPITAL_BASED_OUTPATIENT_CLINIC_OR_DEPARTMENT_OTHER): Payer: Medicare Other | Admitting: Anesthesiology

## 2011-08-30 ENCOUNTER — Ambulatory Visit (HOSPITAL_COMMUNITY)
Admission: RE | Admit: 2011-08-30 | Discharge: 2011-08-30 | Disposition: A | Discharge status: Home / Self Care | Payer: Medicare Other | Source: Ambulatory Visit | Attending: Surgery | Admitting: Surgery

## 2011-08-30 DIAGNOSIS — C50912 Malignant neoplasm of unspecified site of left female breast: Secondary | ICD-10-CM

## 2011-08-30 DIAGNOSIS — G4733 Obstructive sleep apnea (adult) (pediatric): Secondary | ICD-10-CM | POA: Insufficient documentation

## 2011-08-30 DIAGNOSIS — C50919 Malignant neoplasm of unspecified site of unspecified female breast: Secondary | ICD-10-CM | POA: Insufficient documentation

## 2011-08-30 DIAGNOSIS — E785 Hyperlipidemia, unspecified: Secondary | ICD-10-CM | POA: Insufficient documentation

## 2011-08-30 DIAGNOSIS — I10 Essential (primary) hypertension: Secondary | ICD-10-CM | POA: Insufficient documentation

## 2011-08-30 HISTORY — DX: Pneumonia, unspecified organism: J18.9

## 2011-08-30 HISTORY — DX: Shortness of breath: R06.02

## 2011-08-30 HISTORY — DX: Headache: R51

## 2011-08-30 HISTORY — PX: BREAST LUMPECTOMY WITH NEEDLE LOCALIZATION AND AXILLARY SENTINEL LYMPH NODE BX: SHX5760

## 2011-08-30 HISTORY — DX: Encounter for other specified aftercare: Z51.89

## 2011-08-30 HISTORY — DX: Reserved for inherently not codable concepts without codable children: IMO0001

## 2011-08-30 LAB — POCT I-STAT, CHEM 8
Chloride: 109 mEq/L (ref 96–112)
HCT: 38 % (ref 36.0–46.0)
Hemoglobin: 12.9 g/dL (ref 12.0–15.0)
Potassium: 3.4 mEq/L — ABNORMAL LOW (ref 3.5–5.1)
Sodium: 144 mEq/L (ref 135–145)

## 2011-08-30 SURGERY — BREAST LUMPECTOMY WITH NEEDLE LOCALIZATION AND AXILLARY SENTINEL LYMPH NODE BX
Anesthesia: General | Site: Breast | Laterality: Left | Wound class: Clean

## 2011-08-30 MED ORDER — DEXAMETHASONE SODIUM PHOSPHATE 4 MG/ML IJ SOLN
INTRAMUSCULAR | Status: DC | PRN
  Administered 2011-08-30: 10 mg via INTRAVENOUS

## 2011-08-30 MED ORDER — HYDROCODONE-ACETAMINOPHEN 5-325 MG PO TABS
1.0000 | ORAL_TABLET | Freq: Once | ORAL | Status: AC
  Administered 2011-08-30: 1 via ORAL

## 2011-08-30 MED ORDER — PROMETHAZINE HCL 25 MG/ML IJ SOLN: 12.5000 mg | Freq: Four times a day (QID) | INTRAMUSCULAR | Status: DC | PRN

## 2011-08-30 MED ORDER — LACTATED RINGERS IV SOLN
INTRAVENOUS | Status: DC
  Administered 2011-08-30 (×2): via INTRAVENOUS

## 2011-08-30 MED ORDER — FENTANYL CITRATE 0.05 MG/ML IJ SOLN
INTRAMUSCULAR | Status: DC | PRN
  Administered 2011-08-30: 100 ug via INTRAVENOUS

## 2011-08-30 MED ORDER — 0.9 % SODIUM CHLORIDE (POUR BTL) OPTIME
TOPICAL | Status: DC | PRN
  Administered 2011-08-30: 1000 mL

## 2011-08-30 MED ORDER — ONDANSETRON HCL 4 MG/2ML IJ SOLN
INTRAMUSCULAR | Status: DC | PRN
  Administered 2011-08-30: 4 mg via INTRAVENOUS

## 2011-08-30 MED ORDER — CEFAZOLIN SODIUM-DEXTROSE 2-3 GM-% IV SOLR
2.0000 g | INTRAVENOUS | Status: AC
  Administered 2011-08-30: 2 g via INTRAVENOUS

## 2011-08-30 MED ORDER — SODIUM CHLORIDE 0.9 % IJ SOLN: 3.0000 mL | Freq: Two times a day (BID) | INTRAMUSCULAR | Status: DC

## 2011-08-30 MED ORDER — TECHNETIUM TC 99M SULFUR COLLOID FILTERED
1.0000 | Freq: Once | INTRAVENOUS | Status: AC | PRN
  Administered 2011-08-30: 1 via INTRADERMAL

## 2011-08-30 MED ORDER — FENTANYL CITRATE 0.05 MG/ML IJ SOLN
50.0000 ug | INTRAMUSCULAR | Status: DC | PRN
  Administered 2011-08-30: 100 ug via INTRAVENOUS

## 2011-08-30 MED ORDER — SODIUM CHLORIDE 0.9 % IJ SOLN: 3.0000 mL | INTRAMUSCULAR | Status: DC | PRN

## 2011-08-30 MED ORDER — ACETAMINOPHEN 650 MG RE SUPP: 650.0000 mg | RECTAL | Status: DC | PRN

## 2011-08-30 MED ORDER — MIDAZOLAM HCL 2 MG/2ML IJ SOLN
1.0000 mg | INTRAMUSCULAR | Status: DC | PRN
  Administered 2011-08-30: 2 mg via INTRAVENOUS

## 2011-08-30 MED ORDER — FENTANYL CITRATE 0.05 MG/ML IJ SOLN
25.0000 ug | INTRAMUSCULAR | Status: DC | PRN
  Administered 2011-08-30: 25 ug via INTRAVENOUS
  Administered 2011-08-30: 50 ug via INTRAVENOUS
  Administered 2011-08-30: 25 ug via INTRAVENOUS

## 2011-08-30 MED ORDER — MORPHINE SULFATE 2 MG/ML IJ SOLN: 2.0000 mg | INTRAMUSCULAR | Status: DC | PRN

## 2011-08-30 MED ORDER — OXYCODONE HCL 5 MG PO TABS: 5.0000 mg | ORAL_TABLET | ORAL | Status: DC | PRN

## 2011-08-30 MED ORDER — BUPIVACAINE HCL (PF) 0.5 % IJ SOLN
INTRAMUSCULAR | Status: DC | PRN
  Administered 2011-08-30: 20 mL

## 2011-08-30 MED ORDER — SODIUM CHLORIDE 0.9 % IV SOLN: 250.0000 mL | INTRAVENOUS | Status: DC | PRN

## 2011-08-30 MED ORDER — PROPOFOL 10 MG/ML IV EMUL
INTRAVENOUS | Status: DC | PRN
  Administered 2011-08-30: 200 mg via INTRAVENOUS
  Administered 2011-08-30: 50 mg via INTRAVENOUS

## 2011-08-30 MED ORDER — PROMETHAZINE HCL 25 MG/ML IJ SOLN: 6.2500 mg | INTRAMUSCULAR | Status: DC | PRN

## 2011-08-30 MED ORDER — SUCCINYLCHOLINE CHLORIDE 20 MG/ML IJ SOLN
INTRAMUSCULAR | Status: DC | PRN
  Administered 2011-08-30: 100 mg via INTRAVENOUS

## 2011-08-30 MED ORDER — ONDANSETRON HCL 4 MG/2ML IJ SOLN: 4.0000 mg | Freq: Four times a day (QID) | INTRAMUSCULAR | Status: DC | PRN

## 2011-08-30 MED ORDER — ACETAMINOPHEN 325 MG PO TABS: 650.0000 mg | ORAL_TABLET | ORAL | Status: DC | PRN

## 2011-08-30 MED ORDER — MIDAZOLAM HCL 5 MG/5ML IJ SOLN
INTRAMUSCULAR | Status: DC | PRN
  Administered 2011-08-30: 1 mg via INTRAVENOUS

## 2011-08-30 SURGICAL SUPPLY — 56 items
APL SKNCLS STERI-STRIP NONHPOA (GAUZE/BANDAGES/DRESSINGS) ×1
APPLIER CLIP 9.375 MED OPEN (MISCELLANEOUS)
APR CLP MED 9.3 20 MLT OPN (MISCELLANEOUS)
BENZOIN TINCTURE PRP APPL 2/3 (GAUZE/BANDAGES/DRESSINGS) ×1 IMPLANT
BLADE HEX COATED 2.75 (ELECTRODE) ×1 IMPLANT
BLADE SURG 15 STRL LF DISP TIS (BLADE) IMPLANT
BLADE SURG 15 STRL SS (BLADE) ×2
BLADE SURG ROTATE 9660 (MISCELLANEOUS) IMPLANT
CANISTER SUCTION 1200CC (MISCELLANEOUS) ×1 IMPLANT
CHLORAPREP W/TINT 26ML (MISCELLANEOUS) ×1 IMPLANT
CLIP APPLIE 9.375 MED OPEN (MISCELLANEOUS) IMPLANT
CLOTH BEACON ORANGE TIMEOUT ST (SAFETY) ×1 IMPLANT
COVER MAYO STAND STRL (DRAPES) ×1 IMPLANT
COVER PROBE W GEL 5X96 (DRAPES) ×1 IMPLANT
COVER TABLE BACK 60X90 (DRAPES) ×1 IMPLANT
DECANTER SPIKE VIAL GLASS SM (MISCELLANEOUS) IMPLANT
DEVICE DUBIN W/COMP PLATE 8390 (MISCELLANEOUS) ×1 IMPLANT
DRAIN CHANNEL 19F RND (DRAIN) IMPLANT
DRAIN HEMOVAC 1/8 X 5 (WOUND CARE) IMPLANT
DRAPE LAPAROSCOPIC ABDOMINAL (DRAPES) ×1 IMPLANT
DRAPE UTILITY XL STRL (DRAPES) ×1 IMPLANT
DRSG TEGADERM 4X4.75 (GAUZE/BANDAGES/DRESSINGS) ×2 IMPLANT
ELECT REM PT RETURN 9FT ADLT (ELECTROSURGICAL) ×2
ELECTRODE REM PT RTRN 9FT ADLT (ELECTROSURGICAL) IMPLANT
EVACUATOR SILICONE 100CC (DRAIN) IMPLANT
GAUZE SPONGE 4X4 12PLY STRL LF (GAUZE/BANDAGES/DRESSINGS) ×2 IMPLANT
GLOVE BIO SURGEON STRL SZ 6 (GLOVE) ×1 IMPLANT
GLOVE ECLIPSE 6.5 STRL STRAW (GLOVE) ×1 IMPLANT
GLOVE SURG SIGNA 7.5 PF LTX (GLOVE) ×1 IMPLANT
GOWN PREVENTION PLUS XLARGE (GOWN DISPOSABLE) ×1 IMPLANT
GOWN PREVENTION PLUS XXLARGE (GOWN DISPOSABLE) ×1 IMPLANT
HEMOSTAT SURGICEL 2X14 (HEMOSTASIS) IMPLANT
KIT MARKER MARGIN INK (KITS) ×1 IMPLANT
NDL HYPO 25X1 1.5 SAFETY (NEEDLE) IMPLANT
NDL SAFETY ECLIPSE 18X1.5 (NEEDLE) IMPLANT
NEEDLE HYPO 18GX1.5 SHARP (NEEDLE) ×2
NEEDLE HYPO 25X1 1.5 SAFETY (NEEDLE) ×4 IMPLANT
NS IRRIG 1000ML POUR BTL (IV SOLUTION) ×1 IMPLANT
PACK BASIN DAY SURGERY FS (CUSTOM PROCEDURE TRAY) ×1 IMPLANT
PENCIL BUTTON HOLSTER BLD 10FT (ELECTRODE) ×1 IMPLANT
PIN SAFETY STERILE (MISCELLANEOUS) IMPLANT
SLEEVE SCD COMPRESS KNEE MED (MISCELLANEOUS) ×1 IMPLANT
SPONGE LAP 18X18 X RAY DECT (DISPOSABLE) IMPLANT
SPONGE LAP 4X18 X RAY DECT (DISPOSABLE) ×1 IMPLANT
STRIP CLOSURE SKIN 1/2X4 (GAUZE/BANDAGES/DRESSINGS) ×1 IMPLANT
SUT ETHILON 3 0 PS 1 (SUTURE) IMPLANT
SUT MNCRL AB 4-0 PS2 18 (SUTURE) ×2 IMPLANT
SUT VIC AB 3-0 SH 27 (SUTURE) ×2
SUT VIC AB 3-0 SH 27X BRD (SUTURE) IMPLANT
SYR BULB 3OZ (MISCELLANEOUS) ×1 IMPLANT
SYR CONTROL 10ML LL (SYRINGE) ×2 IMPLANT
TOWEL OR 17X24 6PK STRL BLUE (TOWEL DISPOSABLE) ×1 IMPLANT
TOWEL OR NON WOVEN STRL DISP B (DISPOSABLE) ×1 IMPLANT
TUBE CONNECTING 20X1/4 (TUBING) ×1 IMPLANT
WATER STERILE IRR 1000ML POUR (IV SOLUTION) IMPLANT
YANKAUER SUCT BULB TIP NO VENT (SUCTIONS) ×1 IMPLANT

## 2011-08-30 NOTE — Op Note (Signed)
08/30/2011  1:13 PM  PATIENT:  [name]  66 y.o. female  PRE-OPERATIVE DIAGNOSIS:  Left Breast Cancer   POST-OPERATIVE DIAGNOSIS:  Left Breast Cancer   PROCEDURE:  Procedure(s): BREAST LUMPECTOMY WITH NEEDLE LOCALIZATION AND AXILLARY SENTINEL LYMPH NODE BX LEFT BREAST  SURGEON:  Surgeon(s): Shelly Rubenstein, MD  PHYSICIAN ASSISTANT:   ASSISTANTS: none   ANESTHESIA:   general  EBL:  Total I/O In: 1150 [I.V.:1150] Out: -   BLOOD ADMINISTERED:none  DRAINS: none   LOCAL MEDICATIONS USED:  MARCAINE 20CC  SPECIMEN:  Excision  DISPOSITION OF SPECIMEN:  PATHOLOGY  COUNTS:  YES  TOURNIQUET:  * No tourniquets in log *  DICTATION: .Dragon Dictation Indications: This is a 66 year old female with a left breast cancer. The decision has been made to proceed with needle localized lumpectomy and sentinel lymph node biopsy of left breast.  Findings: The suspicious area was removed and confirmed by radiology. 3 sentinel lymph nodes were identified and removed from the left axilla  Procedure: The patient had a localization wire placed in left breast at the breast center. She was then injected with radioisotope around the left areola in the holding area. She was then taken in a stable condition to the operating room. She was identified as the correct patient. She was placed upon the operating table and general anesthesia was induced. Her left breast and axilla were then prepped and draped in the usual sterile fashion. I performed an elliptical incision around the localization wire in the outer quadrant of the left breast. I then performed a wide lumpectomy going down to the chest wall with the cautery. The breast specimen including the localization wire was removed and x-ray. The suspicious area was in the lesion. I then took further anterior and superior margin and sent this to pathology separately. I then packed the wound also creams this was achieved. The neoprobe was brought to  the field. I identified an area of increased uptake in the left axilla. An incision with a scalpel. Admitted this down into the axillary tissue with electrocautery. I was able to identify and remove decrease in the lymph nodes. Only one had uptake of blue dye as well. These were also sent to pathology for evaluation. I irrigated both wounds with saline. Hemostasis achieved with cautery. I removed all packing. I then anesthetized with Marcaine. I closed both incisions with interrupted 3-0 Vicryl sutures and running 4-0 Monocryl sutures. Steri-Strips gauze and tape were applied. The patient tolerated the procedure well. All the counts were correct at the end of the procedure. The patient was then extubated in the operating room and taken in a stable condition to the recovery room. PLAN OF CARE: Discharge to home after PACU  PATIENT DISPOSITION:  PACU - hemodynamically stable.   Delay start of Pharmacological VTE agent (>24hrs) due to surgical blood loss or risk of bleeding:  {YES/NO/NOT APPLICABLE:20182

## 2011-08-30 NOTE — Interval H&P Note (Signed)
History and Physical Interval Note: patient has had no change in her history or exam  08/30/2011 10:15 AM  [name]  has presented today for surgery, with the diagnosis of left breast cancer   The various methods of treatment have been discussed with the patient and family. After consideration of risks, benefits and other options for treatment, the patient has consented to  Procedure(s): BREAST LUMPECTOMY WITH NEEDLE LOCALIZATION AND AXILLARY SENTINEL LYMPH NODE BX LEFT BREAST as a surgical intervention .  The patients' history has been reviewed, patient examined, no change in status, stable for surgery.  I have reviewed the patients' chart and labs.  Questions were answered to the patient's satisfaction.     Anakin Varkey A

## 2011-08-30 NOTE — H&P (View-Only) (Signed)
Patient ID: [name], female   DOB: 02/14/1946, 65 y.o.   MRN: [id number]  Chief Complaint  Patient presents with  . Other    Eval breast cancer    HPI [name] is a 65 y.o. female.   HPI This is a very pleasant female referred by Dr. Bruzetti for for evaluation of a recently diagnosed left breast cancer. This was found on screening mammography. She has had no previous problems with her breasts. She has had no previous biopsies. She denies nipple discharge. Family history is negative for breast cancer Past Medical History  Diagnosis Date  . OSA (obstructive sleep apnea)   . Hypertension   . Hyperlipidemia   . Cancer     breast    Past Surgical History  Procedure Date  . Total knee arthroplasty 2006    bilateral  . Thyroid surgery 1990's  . Polypectomy 1990's  . Appendectomy 1978    Family History  Problem Relation Age of Onset  . Heart disease Father   . Clotting disorder Father   . Stroke Father   . Stroke Mother   . Diabetes Mother     Social History History  Substance Use Topics  . Smoking status: Never Smoker   . Smokeless tobacco: Never Used  . Alcohol Use: No    Allergies  Allergen Reactions  . Lodine (Etodolac)     itching    Current Outpatient Prescriptions  Medication Sig Dispense Refill  . albuterol (PROVENTIL HFA;VENTOLIN HFA) 108 (90 BASE) MCG/ACT inhaler Inhale 1-2 puffs into the lungs every 6 (six) hours as needed for wheezing.  1 Inhaler  0  . azithromycin (ZITHROMAX) 250 MG tablet Take 250 mg by mouth as directed. Course started 07/12/11 for 5 day supply....takes 2 tablets on day 1, then takes 1 tablet daily on days 2-5       . clopidogrel (PLAVIX) 75 MG tablet Take 75 mg by mouth daily.        . fish oil-omega-3 fatty acids 1000 MG capsule Take 1 capsule by mouth 2 (two) times daily.        . HYDROcodone-acetaminophen (NORCO) 10-325 MG per tablet Take 1 tablet by mouth every 8 (eight) hours as needed. Pain.      .  losartan-hydrochlorothiazide (HYZAAR) 100-25 MG per tablet Take 1 tablet by mouth daily.        . niacin (NIASPAN) 500 MG CR tablet Take 500 mg by mouth 2 (two) times daily.       . simvastatin (ZOCOR) 40 MG tablet Take 40 mg by mouth at bedtime.        . verapamil (VERELAN PM) 360 MG 24 hr capsule Take 360 mg by mouth daily.          Review of Systems Review of Systems  Constitutional: Negative for fever, chills and unexpected weight change.  HENT: Negative for hearing loss, congestion, sore throat, trouble swallowing and voice change.   Eyes: Negative for visual disturbance.  Respiratory: Negative for cough and wheezing.        Cpap  Cardiovascular: Negative for chest pain, palpitations and leg swelling.  Gastrointestinal: Negative for nausea, vomiting, abdominal pain, diarrhea, constipation, blood in stool, abdominal distention and anal bleeding.  Genitourinary: Negative for hematuria, vaginal bleeding and difficulty urinating.  Musculoskeletal: Negative for arthralgias.  Skin: Negative for rash and wound.  Neurological: Negative for seizures, syncope and headaches.  Hematological: Negative for adenopathy. Does not bruise/bleed easily.  Psychiatric/Behavioral: Negative for   confusion.    Blood pressure 156/88, pulse 88, temperature 97.8 F (36.6 C), temperature source Temporal, resp. rate 18, height 5' 7" (1.702 m), weight 299 lb 3.2 oz (135.716 kg).  Physical Exam Physical Exam  Constitutional: [name], place, and time. She appears well-developed and well-nourished. No distress.       Morbidly obese female  HENT:  Head: Normocephalic and atraumatic.  Right Ear: External ear normal.  Left Ear: External ear normal.  Nose: Nose normal.  Mouth/Throat: Oropharynx is clear and moist. No oropharyngeal exudate.  Eyes: Conjunctivae are normal. Pupils are equal, round, and reactive to light. No scleral icterus.  Neck: Normal range of motion. Neck supple. No tracheal  deviation present. No thyromegaly present.       Well-healed incision on the neck from previous thyroidectomy  Cardiovascular: Normal rate, regular rhythm, normal heart sounds and intact distal pulses.   No murmur heard. Pulmonary/Chest: Effort normal and breath sounds normal. No respiratory distress. She has no wheezes. She has no rales.  Abdominal: Soft. Bowel sounds are normal. She exhibits no distension. There is no tenderness. There is no rebound.       Well-healed incision just to the right of the midline  Musculoskeletal: Normal range of motion. She exhibits no edema and no tenderness.  Lymphadenopathy:    She has no cervical adenopathy.    She has no axillary adenopathy.       Right: No supraclavicular adenopathy present.       Left: No supraclavicular adenopathy present.  Neurological: She is alert and oriented to person, place, and time. No cranial nerve deficit.  Skin: Skin is warm and dry. No rash noted. No erythema.  Psychiatric: Her behavior is normal. Judgment normal.  Breasts: Large bilaterally. No palpable masses in either breast.  Data Reviewed I have reviewed her mammograms and MRI. These demonstrate the lesion at the 2:00 position of the left breast. There are no other modalities. A biopsy shows invasive ductal carcinoma which is 100% ER and PR positive  Assessment    Invasive left breast cancer    Plan    I had a discussion with her regarding therapy. We discussed mastectomy versus breast conservation. I am recommending breast conservation with left breast needle localized lumpectomy and sentinel lymph node biopsy. I discussed this with her in detail. I discussed the risk of surgery which includes but is not limited to bleeding, infection, need for further surgery should margins or lymph nodes be positive, injury to surrounding structures, cardiopulmonary issues, DVT, etc. She understands and wishes to proceed. Likelihood of success is good. I will refer her to the  medical and radiation physicians postoperatively       Dailen Mcclish A 08/10/2011, 9:32 AM    

## 2011-08-30 NOTE — Anesthesia Procedure Notes (Signed)
Procedure Name: Intubation Date/Time: 08/30/2011 12:24 PM Performed by: Zenia Resides D Pre-anesthesia Checklist: Patient identified, Emergency Drugs available, Suction available, Patient being monitored and Timeout performed Patient Re-evaluated:Patient Re-evaluated prior to inductionOxygen Delivery Method: Circle System Utilized Preoxygenation: Pre-oxygenation with 100% oxygen Intubation Type: IV induction Ventilation: Mask ventilation without difficulty Laryngoscope Size: Mac and 3 Grade View: Grade II Tube type: Oral Number of attempts: 1 Airway Equipment and Method: stylet and oral airway Placement Confirmation: ETT inserted through vocal cords under direct vision,  positive ETCO2 and breath sounds checked- equal and bilateral Secured at: 23 cm Tube secured with: Tape Dental Injury: Teeth and Oropharynx as per pre-operative assessment

## 2011-08-30 NOTE — Progress Notes (Signed)
Emotional support during breast injections °

## 2011-08-30 NOTE — Anesthesia Postprocedure Evaluation (Signed)
  Anesthesia Post-op Note  Patient: [name]  Procedure(s) Performed:  BREAST LUMPECTOMY WITH NEEDLE LOCALIZATION AND AXILLARY SENTINEL LYMPH NODE BX - left needle localized breast lumpectomy left sentinal lymph node biopy   Patient Location: PACU  Anesthesia Type: General  Level of Consciousness: awake, alert  and oriented  Airway and Oxygen Therapy: Patient Spontanous Breathing  Post-op Pain: mild  Post-op Assessment: Post-op Vital signs reviewed, Patient's Cardiovascular Status Stable, Respiratory Function Stable, Patent Airway, No signs of Nausea or vomiting and Pain level controlled  Post-op Vital Signs: Reviewed and stable  Complications: No apparent anesthesia complications

## 2011-08-30 NOTE — Anesthesia Preprocedure Evaluation (Signed)
Anesthesia Evaluation  Patient identified by MRN, date of birth, ID band Patient awake    Reviewed: Allergy & Precautions, H&P , NPO status , Patient's Chart, lab work & pertinent test results  History of Anesthesia Complications Negative for: history of anesthetic complications  Airway Mallampati: I TM Distance: >3 FB Neck ROM: Full    Dental No notable dental hx. (+) Teeth Intact and Dental Advisory Given   Pulmonary sleep apnea and Continuous Positive Airway Pressure Ventilation , Recent URI , Resolved,  clear to auscultation  Pulmonary exam normal       Cardiovascular hypertension, Pt. on medications Regular Normal    Neuro/Psych H/o pituitary tumor- s/p transsphenoidal hypophysectomy    GI/Hepatic negative GI ROS, Neg liver ROS,   Endo/Other  Morbid obesity  Renal/GU negative Renal ROS     Musculoskeletal  (+) Arthritis -, Osteoarthritis,    Abdominal (+) obese,   Peds  Hematology Sickle trait, never had problems   Anesthesia Other Findings   Reproductive/Obstetrics                           Anesthesia Physical Anesthesia Plan  ASA: III  Anesthesia Plan: General   Post-op Pain Management:    Induction:   Airway Management Planned: LMA  Additional Equipment:   Intra-op Plan:   Post-operative Plan:   Informed Consent: I have reviewed the patients History and Physical, chart, labs and discussed the procedure including the risks, benefits and alternatives for the proposed anesthesia with the patient or authorized representative who has indicated his/her understanding and acceptance.   Dental advisory given  Plan Discussed with: CRNA and Surgeon  Anesthesia Plan Comments: (Plan routine monitors, GA- LMA OK)        Anesthesia Quick Evaluation

## 2011-08-30 NOTE — Transfer of Care (Signed)
Immediate Anesthesia Transfer of Care Note  Patient: [name]  Procedure(s) Performed:  BREAST LUMPECTOMY WITH NEEDLE LOCALIZATION AND AXILLARY SENTINEL LYMPH NODE BX - left needle localized breast lumpectomy left sentinal lymph node biopy   Patient Location: PACU  Anesthesia Type: General  Level of Consciousness: awake, alert  and oriented  Airway & Oxygen Therapy: Patient Spontanous Breathing and Patient connected to face mask oxygen  Post-op Assessment: Report given to PACU RN and Post -op Vital signs reviewed and stable  Post vital signs: Reviewed and stable  Complications: No apparent anesthesia complications

## 2011-08-30 NOTE — Progress Notes (Signed)
Patient has pain medication at home.  Instructed patient to wear her CPAP during all naps and at bedtime.

## 2011-08-31 ENCOUNTER — Telehealth (INDEPENDENT_AMBULATORY_CARE_PROVIDER_SITE_OTHER): Payer: Self-pay

## 2011-08-31 NOTE — Telephone Encounter (Signed)
Patient called and stated her urine is a blue green color.  She had a lumpectomy yesterday with nuc med injection.  I told her it can be caused by the injection.

## 2011-09-01 ENCOUNTER — Encounter (INDEPENDENT_AMBULATORY_CARE_PROVIDER_SITE_OTHER): Payer: Self-pay | Admitting: General Surgery

## 2011-09-05 ENCOUNTER — Other Ambulatory Visit (INDEPENDENT_AMBULATORY_CARE_PROVIDER_SITE_OTHER): Payer: Self-pay | Admitting: Surgery

## 2011-09-05 ENCOUNTER — Ambulatory Visit (INDEPENDENT_AMBULATORY_CARE_PROVIDER_SITE_OTHER): Payer: Medicare Other | Admitting: Surgery

## 2011-09-05 ENCOUNTER — Encounter (INDEPENDENT_AMBULATORY_CARE_PROVIDER_SITE_OTHER): Payer: Self-pay | Admitting: Surgery

## 2011-09-05 VITALS — BP 148/86 | HR 76 | Temp 98.9°F | Resp 24 | Ht 67.0 in | Wt 296.8 lb

## 2011-09-05 DIAGNOSIS — Z09 Encounter for follow-up examination after completed treatment for conditions other than malignant neoplasm: Secondary | ICD-10-CM

## 2011-09-05 DIAGNOSIS — C50912 Malignant neoplasm of unspecified site of left female breast: Secondary | ICD-10-CM

## 2011-09-05 NOTE — Progress Notes (Signed)
Subjective:     Patient ID: [name], female   DOB: 04-16-1946, 66 y.o.   MRN: [id number]  HPI She is here for her first postoperative visit status post needle localized left breast lumpectomy and sentinel node biopsy for invasive cancer. She is doing well and has no complaints.  Review of Systems     Objective:   Physical Exam She is here for her first postoperative visit status post needle localized left breast lumpectomy and sentinel node biopsy for invasive cancer. She is doing well and has no complaints.    Assessment:     Patient status post lumpectomy of the left breast in similar biopsy for a low-grade invasive ductal carcinoma with negative nodes    Plan:     We will now make arrangements for her to see the medical and radiation oncologists at the cancer center. I will see her back in 3 months.

## 2011-09-11 ENCOUNTER — Encounter: Payer: Self-pay | Admitting: Radiation Oncology

## 2011-09-11 ENCOUNTER — Other Ambulatory Visit: Payer: Self-pay | Admitting: *Deleted

## 2011-09-11 ENCOUNTER — Telehealth: Payer: Self-pay | Admitting: Oncology

## 2011-09-11 ENCOUNTER — Ambulatory Visit
Admission: RE | Admit: 2011-09-11 | Discharge: 2011-09-11 | Disposition: A | Discharge status: Home / Self Care | Payer: Medicare Other | Source: Ambulatory Visit | Attending: Radiation Oncology | Admitting: Radiation Oncology

## 2011-09-11 ENCOUNTER — Ambulatory Visit (HOSPITAL_BASED_OUTPATIENT_CLINIC_OR_DEPARTMENT_OTHER): Payer: Medicare Other | Admitting: Oncology

## 2011-09-11 ENCOUNTER — Other Ambulatory Visit (HOSPITAL_BASED_OUTPATIENT_CLINIC_OR_DEPARTMENT_OTHER): Payer: Medicare Other | Admitting: Lab

## 2011-09-11 ENCOUNTER — Ambulatory Visit: Payer: Medicare Other

## 2011-09-11 VITALS — BP 154/74 | HR 89 | Temp 98.5°F | Ht 65.2 in | Wt 294.3 lb

## 2011-09-11 VITALS — BP 154/74 | HR 89 | Temp 98.5°F | Resp 20 | Wt 294.4 lb

## 2011-09-11 DIAGNOSIS — E785 Hyperlipidemia, unspecified: Secondary | ICD-10-CM | POA: Insufficient documentation

## 2011-09-11 DIAGNOSIS — G4733 Obstructive sleep apnea (adult) (pediatric): Secondary | ICD-10-CM | POA: Insufficient documentation

## 2011-09-11 DIAGNOSIS — C50912 Malignant neoplasm of unspecified site of left female breast: Secondary | ICD-10-CM

## 2011-09-11 DIAGNOSIS — Z510 Encounter for antineoplastic radiation therapy: Secondary | ICD-10-CM | POA: Insufficient documentation

## 2011-09-11 DIAGNOSIS — Z79899 Other long term (current) drug therapy: Secondary | ICD-10-CM | POA: Insufficient documentation

## 2011-09-11 DIAGNOSIS — C50919 Malignant neoplasm of unspecified site of unspecified female breast: Secondary | ICD-10-CM | POA: Insufficient documentation

## 2011-09-11 DIAGNOSIS — I10 Essential (primary) hypertension: Secondary | ICD-10-CM | POA: Insufficient documentation

## 2011-09-11 DIAGNOSIS — C50419 Malignant neoplasm of upper-outer quadrant of unspecified female breast: Secondary | ICD-10-CM

## 2011-09-11 HISTORY — DX: Major depressive disorder, single episode, unspecified: F32.9

## 2011-09-11 HISTORY — DX: Anxiety disorder, unspecified: F41.9

## 2011-09-11 HISTORY — DX: Anemia, unspecified: D64.9

## 2011-09-11 HISTORY — DX: Depression, unspecified: F32.A

## 2011-09-11 HISTORY — DX: Malignant neoplasm of unspecified site of unspecified female breast: C50.919

## 2011-09-11 LAB — CBC WITH DIFFERENTIAL/PLATELET
Basophils Absolute: 0 10*3/uL (ref 0.0–0.1)
Eosinophils Absolute: 0.1 10*3/uL (ref 0.0–0.5)
HGB: 12 g/dL (ref 11.6–15.9)
MCV: 81.6 fL (ref 79.5–101.0)
MONO#: 0.5 10*3/uL (ref 0.1–0.9)
MONO%: 6.3 % (ref 0.0–14.0)
NEUT#: 6.1 10*3/uL (ref 1.5–6.5)
Platelets: 259 10*3/uL (ref 145–400)
RDW: 16.7 % — ABNORMAL HIGH (ref 11.2–14.5)

## 2011-09-11 NOTE — Progress Notes (Signed)
Referral MD Dr Barrie Dunker Reason for Referral: Breast cancer  No chief complaint on file. : The patient is a 66 year old African American woman from [facility] referred by Dr. Magnus Ivan for evaluation and treatment of her recently diagnosed breast cancer. She she is here with her husband Onalee Hua.   HPI The patient has undergone annual screening mammography. Screening mammogram performed on 07/13/2011 revealed a possible mass in the left breast. Diagnostic left mammogram left breast ultrasound performed one tooth 2013 showed a mass at the at 2:00 position, measuring 0.6 x 0.6 x 0.4 cm. A biopsy was recommended. No abnormalities were seen in the axilla. A biopsy performed on 08/02/2011 showed invasive ductal cancer intermediate grade with the ER measured at 100%, PR measured at 100% Ki-67 6%, HER-2 was nonamplified the ratio 1.09. MRI scan of both breasts on 08/08/2011 showed a mass in the upper outer quadrant of the left breast measuring 1.2 x 1.2 x 1.2 cm. No other abnormalities were seen. The patient underwent lumpectomy with sentinel lymph node evaluation on 08/30/2011. Final pathology showed a mass measuring 1.3 cm with similar prognostic panel. HER-2 ratio was low and not detected at 1. The total of 3 sentinel lymph nodes were identified all returned negative for malignancy, surgical margins were clear. Ki-67 was low at 6%. She has recovered well from surgery.   Past Medical History  Diagnosis Date  . OSA (obstructive sleep apnea)   . Hypertension   . Hyperlipidemia   . Headache   . Blood transfusion   . Shortness of breath   . Pneumonia 4/12  . Cancer     lt breast  . Breast cancer   . Anemia   . Anxiety   . Depression     Dr.Lewit  :  Past Surgical History  Procedure Date  . Total knee arthroplasty 2006    bilateral  . Thyroid surgery 1990's  . Polypectomy 1990's  . Appendectomy 1978  . Joint replacement     rt/lt total knees  . Thyroid cyst excision 1994  . Transphenoidal  / transnasal hypophysectomy / resection pituitary tumor 6/11  . Eye surgery     eye lid surg  . Breast surgery 08/2011    lt lumpectomy  :  Current outpatient prescriptions:clopidogrel (PLAVIX) 75 MG tablet, Take 75 mg by mouth daily.  , Disp: , Rfl: ;  clotrimazole-betamethasone (LOTRISONE) cream, daily., Disp: , Rfl: ;  fish oil-omega-3 fatty acids 1000 MG capsule, Take 1 capsule by mouth 2 (two) times daily.  , Disp: , Rfl: ;  gabapentin (NEURONTIN) 300 MG capsule, Take 300 mg by mouth Nightly., Disp: , Rfl:  HYDROcodone-acetaminophen (NORCO) 10-325 MG per tablet, Take 1 tablet by mouth every 8 (eight) hours as needed. Pain., Disp: , Rfl: ;  losartan-hydrochlorothiazide (HYZAAR) 100-25 MG per tablet, Take 1 tablet by mouth daily.  , Disp: , Rfl: ;  niacin (NIASPAN) 500 MG CR tablet, Take 500 mg by mouth 2 (two) times daily. , Disp: , Rfl: ;  simvastatin (ZOCOR) 40 MG tablet, Take 40 mg by mouth at bedtime.  , Disp: , Rfl:  verapamil (VERELAN PM) 360 MG 24 hr capsule, Take 360 mg by mouth daily.  , Disp: , Rfl: ;  albuterol (PROVENTIL HFA;VENTOLIN HFA) 108 (90 BASE) MCG/ACT inhaler, Inhale 1-2 puffs into the lungs every 6 (six) hours as needed for wheezing., Disp: 1 Inhaler, Rfl: 0 azithromycin (ZITHROMAX) 250 MG tablet, Take 250 mg by mouth as directed. Course started 07/12/11 for 5  day supply....takes 2 tablets on day 1, then takes 1 tablet daily on days 2-5 , Disp: , Rfl: :    :  Allergies  Allergen Reactions  . Lodine (Etodolac)     itching  :  Family History  Problem Relation Age of Onset  . Heart disease Father   . Clotting disorder Father   . Stroke Father   . Stroke Mother   . Diabetes Mother   :  History   Social History  . Marital Status: Married x 87 y    Spouse Name: N/A    Number of Children: 2, 48 and 44  . Years of Education: N/A, she is originally from [place]    Occupational History  . retired from Warehouse manager work, works part-time.. Husband works for [employer],  sells blinds.    Social History Main Topics  . Smoking status: Never Smoker   . Smokeless tobacco: Never Used  . Alcohol Use: No  . Drug Use: No  . Sexually Active: Not on file   Other Topics Concern  . Not on file   Social History Narrative  . No narrative on file   :Repro Hx G2P2 Menarche 12 Menopause 50 No HRT   Constitutional: negative Eyes: negative Respiratory: positive for dyspnea on exertion Cardiovascular: negative Gastrointestinal: negative Musculoskeletal:negative  Exam: @IPVITALS @ General appearance: alert, cooperative and appears stated age Eyes: conjunctivae/corneas clear. PERRL, EOM's intact. Fundi benign. Throat: lips, mucosa, and tongue normal; teeth and gums normal Resp: clear to auscultation bilaterally and normal percussion bilaterally Breasts: normal appearance, no masses or tenderness Cardio: regular rate and rhythm, S1, S2 normal, no murmur, click, rub or gallop and normal apical impulse GI: soft, non-tender; bowel sounds normal; no masses,  no organomegaly Extremities: extremities normal, atraumatic, no cyanosis or edema Neurologic: Grossly normal   Basename 09/11/11 1307  WBC 8.2  HGB 12.0  HCT 36.8  PLT 259    Basename 09/11/11 1307  NA 141  K 3.5  CL 102  CO2 26  GLUCOSE 142*  BUN 14  CREATININE 1.07  CALCIUM 10.3    Blood smear review: n/a  Pathology:as above  Nm Sentinel Node Inj-no Rpt (breast)  08/30/2011  CLINICAL DATA: left breast cancer   Sulfur colloid was injected intradermally by the nuclear medicine  technologist for breast cancer sentinel node localization.     Mm Breast Surgical Specimen  08/30/2011  *RADIOLOGY REPORT*  Clinical Data:  66 year old female with left breast cancer - wire localization prior to lumpectomy.  NEEDLE LOCALIZATION WITH MAMMOGRAPHIC GUIDANCE AND SPECIMEN RADIOGRAPH  Patient presents for needle localization prior to left lumpectomy. I met with the patient and we discussed the procedure of  needle localization including benefits and alternatives. We discussed the high likelihood of a successful procedure. We discussed the risks of the procedure, including infection, bleeding, tissue injury, and further surgery. Informed, written consent was given.  Using mammographic guidance, sterile technique, 2% lidocaine and a 7 cm modified Kopans needle, the biopsy clip and mass in the outer left breast was localized using a lateral approach.  The films are marked for Dr. Magnus Ivan.  Specimen radiograph was performed at day surgery, and confirms the clip, mass and wire present in the tissue sample.  The specimen is marked for pathology.  IMPRESSION: Needle localization left breast.  No apparent complications.  Original Report Authenticated By: Rosendo Gros, M.D.   Mm Breast Wire Localization Left  08/30/2011  *RADIOLOGY REPORT*  Clinical Data:  66 year old female  with left breast cancer - wire localization prior to lumpectomy.  NEEDLE LOCALIZATION WITH MAMMOGRAPHIC GUIDANCE AND SPECIMEN RADIOGRAPH  Patient presents for needle localization prior to left lumpectomy. I met with the patient and we discussed the procedure of needle localization including benefits and alternatives. We discussed the high likelihood of a successful procedure. We discussed the risks of the procedure, including infection, bleeding, tissue injury, and further surgery. Informed, written consent was given.  Using mammographic guidance, sterile technique, 2% lidocaine and a 7 cm modified Kopans needle, the biopsy clip and mass in the outer left breast was localized using a lateral approach.  The films are marked for Dr. Magnus Ivan.  Specimen radiograph was performed at day surgery, and confirms the clip, mass and wire present in the tissue sample.  The specimen is marked for pathology.  IMPRESSION: Needle localization left breast.  No apparent complications.  Original Report Authenticated By: Rosendo Gros, M.D.    Assessment and Plan:   Pleasant postmenopausal woman presents with T1 C. N0, ER/PR positive, HER-2 negative breast cancer. She has a low proliferative index with a low histological grade. She is due to see radiation oncologist today as well. I have discussed the overall treatment plans as well as the indications for further adjuvant treatment. I think given the low-risk nature of her malignancy and negative lymph node status that she is a good candidate for hormonal therapy alone in the form of AI therapy. I do not think that chemotherapy be indicated. I did discuss with her submission of her tissue for Oncotype testing. I also discussed with her side effects of AI therapy. And the need for repeating her bone density test in checking the vitamin D level.  Once again I anticipate seeing her in 2-3 weeks.   Pierce Crane MD 09/11/11

## 2011-09-11 NOTE — Progress Notes (Signed)
CC:   [name], M.D. Pierce Crane, M.D., F.R.C.P.C. [name] Quitter, M.D.  REFERRING PHYSICIAN:  [name], M.D.  DIAGNOSIS:  Stage I invasive ductal carcinoma of the left breast (pT1c pN0).  HISTORY OF PRESENT ILLNESS:  Ms. [name] is a very pleasant 66 year old female who is seen out of courtesy of Dr. Magnus Ivan for an opinion concerning radiation therapy as part of the management of the patient's recently diagnosed left breast cancer.  Last year on routine screening mammography the patient was noted to have an abnormality in the left breast.  In light of this, the patient proceeded to undergo digital diagnostic left mammogram and ultrasound. According to the radiologist this area could be palpated in the upper outer quadrant of the left breast.  On ultrasound the area of concern measured 0.6 x 0.6 x 0.4 cm.  This was an ill-defined hypoechoic mass on ultrasound.  The lesion was noted to be approximately 7 cm from the nipple area.  Spot compression views showed within the upper outer quadrant of the left breast in the middle third the presence of a high density oval mass with irregular margins.  In light of this the patient proceeded to undergo biopsy of this area which revealed an invasive ductal carcinoma.  The tumor was estrogen receptor positive at 100% and progesterone receptor positive at 100%.  Ki-67 was 6%.  There was no amplification of HER2/neu detected.  The patient was subsequently seen by Dr. Magnus Ivan.  The patient was felt to be a good candidate for breast conservation therapy.  The patient proceeded to undergo left breast lumpectomy with needle localization and axillary sentinel node procedure.  Upon pathologic review the patient was found have a well- differentiated and invasive ductal carcinoma measuring 1.3 cm in greatest dimension.  The surgical margins were clear with the closest margin being greater than 1 cm.  The patient had 3 benign  sentinel lymph nodes removed from the left axilla.  In summary, the patient was found have a low-grade stage T1c invasive ductal carcinoma of the left breast. Postoperatively the patient has been doing well with minimal discomfort in the breast.  She did see Dr. Pierce Crane earlier today for medical oncology evaluation.  Current plans are for the patient to proceed with what sounds like an Oncotype DX testing.  The patient is now seen in radiation oncology for consultation and consideration for treatments.  PAST MEDICAL HISTORY:  The patient has allergy to Lodine.  CURRENT MEDICATIONS:  Include Norco 10/325 one tablet every 6 hours as needed for pain.  The patient is also on albuterol inhaler, Zithromax 250 mg.  The patient is also on Plavix 75 mg daily, Lotrisone cream, Fish Oil supplement, Neurontin 300 mg.  The patient is also on Hyzaar 10/25 mg tablet, Niaspan 500 mg CR tablet, Zocor 40 mg tablet and verapamil 360 mg 24 hour capsule.  PAST MEDICAL HISTORY:  Significant for obstructive sleep apnea.  The patient does use a CPAP.  Medical history is also significant for hypertension, hyperlipidemia, chronic dyspnea, history of pneumonia in April of 2012, history of anemia, anxiety and depression.  Prior surgeries include total knee arthroplasty both left and right, history of thyroid surgery, appendectomy, thyroid cyst excision.  The patient also underwent transsphenoidal hypophysectomy for resection of pituitary tumor by Dr. Nuala Alpha in June of 2011.  SOCIAL HISTORY:  The patient is married and lives in the [place] area.  The patient works part-time in Recruitment consultant.  No history of  tobacco use or alcohol intake.  FAMILY HISTORY:  There is no family history of breast or ovarian cancer.  REVIEW OF SYSTEMS:  The patient has minimal discomfort in the left breast.  The patient does have chronic low back pain and according to the patient she has history of spinal stenosis and may  eventually require surgery concerning this issue.  Prior to diagnosis the patient denied any pain in the left breast, nipple discharge or bleeding.  A complete review of systems is undertaken with the patient by myself and other than above mentioned issues is unremarkable.  PHYSICAL EXAMINATION:  General:  This is a very pleasant 66 year old female in no acute distress.  Vital signs:  Temperature 98.5, pulse 89, blood pressure is mildly elevated at 164/74 and I have recommended the patient follow up with her primary care physician concerning this issue. The patient's weight is 294 pounds.  HEENT:  Examination of the pupils reveals them to be equal, round and reactive to light.  The extraocular eye movements are intact.  The tongue is midline.  There is no secondary infection noted in the oral cavity or posterior pharynx.  Neck: Examination of the neck and supraclavicular region reveals no evidence of adenopathy.  The axillary areas are free of adenopathy.  Lungs: Examination of the lungs reveals them to be clear.  Heart:  The heart has regular rhythm and rate.  Breast:  Examination of the right breast reveals it to be large and pendulous without mass or nipple discharge. Examination of the left breast reveals it also to be large and pendulous.  There is a well-healing scar in the lateral aspect of the breast at approximately the 2 o'clock position, approximately 7-8 cm from the areolar border.  The patient also has a small scar in the axillary region from her sentinel node procedure also which is healing well.  There is no dominant mass appreciated in the breast, nipple discharge or bleeding.  Abdomen:  Examination of the abdomen reveals it to be soft and nontender with normal bowel sounds.  There is no obvious hepatosplenomegaly.  Neurological:  Motor strength is 5/5 in the proximal and distal muscle groups of the upper and lower extremities. The patient does have some edema in the  ankle and foot areas.  LABORATORY DATA:  From 08/30/2011, hemoglobin 12.9, hematocrit 38.0, sodium 144, potassium 3.4, chloride 109, glucose 111, BUN 15, creatinine 1.0.  X-ray studies as summarized in the HPI.  IMPRESSION AND PLAN:  Stage I invasive ductal carcinoma of the left breast.  I feel Ms. Burling would be an excellent candidate for breast conservation with radiation therapy directed at the left breast. Given the patient's age and tumor size, I would not recommend excisional biopsy alone in this situation.  I initially felt that the patient would be a candidate for partial breast radiation with MammoSite system.  The patient did undergo a preliminary CT scan through the central chest. The patient's surgical bed however appears too close to the skin and a few CT slices and therefore would not recommend the patient proceeding with this treatment approach.  The patient would be a good candidate for conventional radiation therapy extending over approximately 6 weeks. Due to the patient's overall large breast size and body contour I would not recommend hypofractionated accelerated radiation therapy in this situation.  I discussed the overall treatment course, side effects and potential toxicities of radiation therapy in this situation with Ms. Hands.  The patient appears to understand and  wishes to proceed with planned course of treatment.  The patient will return for CT simulation in approximately 2 weeks assuming that her Oncotype DX testing does not show a more aggressive tumor warranting adjuvant chemotherapy.    ______________________________ Billie Lade, Ph.D., M.D. JDK/MEDQ  D:  09/11/2011  T:  09/11/2011  Job:  [id number]

## 2011-09-11 NOTE — Progress Notes (Signed)
Please see the Nurse Progress Note in the MD Initial Consult Encounter for this patient. 

## 2011-09-11 NOTE — Progress Notes (Signed)
Here for assessment for radiation  for left breast ca. ERPR Positive. Menses age 66 First pregnancy age 54 Children: 1 daughter and 1 son. IUD for 15 years. Last menstrual cycle age 56.

## 2011-09-11 NOTE — Telephone Encounter (Signed)
gve the pt her march 2013 appt calendar along with the bone density appt

## 2011-09-12 ENCOUNTER — Telehealth: Payer: Self-pay | Admitting: *Deleted

## 2011-09-12 LAB — CANCER ANTIGEN 27.29: CA 27.29: 11 U/mL (ref 0–39)

## 2011-09-12 LAB — COMPREHENSIVE METABOLIC PANEL
Albumin: 3.9 g/dL (ref 3.5–5.2)
Alkaline Phosphatase: 75 U/L (ref 39–117)
BUN: 14 mg/dL (ref 6–23)
CO2: 26 mEq/L (ref 19–32)
Calcium: 10.3 mg/dL (ref 8.4–10.5)
Glucose, Bld: 142 mg/dL — ABNORMAL HIGH (ref 70–99)
Potassium: 3.5 mEq/L (ref 3.5–5.3)

## 2011-09-12 LAB — VITAMIN D 25 HYDROXY (VIT D DEFICIENCY, FRACTURES): Vit D, 25-Hydroxy: 15 ng/mL — ABNORMAL LOW (ref 30–89)

## 2011-09-12 NOTE — Progress Notes (Signed)
Encounter addended by: Tessa Lerner, RN on: 09/12/2011  7:52 AM<BR>     Documentation filed: Charges VN

## 2011-09-12 NOTE — Telephone Encounter (Signed)
Pt. Returns my phone call.  Her Vitamin D level is low and Dr. Donnie Coffin wants her to start on vitamin D 2000 iu daily.  She verbalizes understanding.

## 2011-09-13 ENCOUNTER — Encounter: Payer: Self-pay | Admitting: *Deleted

## 2011-09-13 NOTE — Progress Notes (Signed)
Mailed after appt letter to pt. 

## 2011-09-15 ENCOUNTER — Emergency Department (HOSPITAL_COMMUNITY): Payer: Medicare Other

## 2011-09-15 ENCOUNTER — Other Ambulatory Visit: Payer: Self-pay

## 2011-09-15 ENCOUNTER — Encounter: Payer: Self-pay | Admitting: *Deleted

## 2011-09-15 ENCOUNTER — Encounter (HOSPITAL_COMMUNITY): Payer: Self-pay | Admitting: *Deleted

## 2011-09-15 ENCOUNTER — Emergency Department (HOSPITAL_COMMUNITY)
Admission: EM | Admit: 2011-09-15 | Discharge: 2011-09-15 | Disposition: A | Discharge status: Home / Self Care | Payer: Medicare Other | Attending: Emergency Medicine | Admitting: Emergency Medicine

## 2011-09-15 ENCOUNTER — Telehealth: Payer: Self-pay | Admitting: Pulmonary Disease

## 2011-09-15 DIAGNOSIS — Z853 Personal history of malignant neoplasm of breast: Secondary | ICD-10-CM | POA: Insufficient documentation

## 2011-09-15 DIAGNOSIS — R079 Chest pain, unspecified: Secondary | ICD-10-CM | POA: Insufficient documentation

## 2011-09-15 DIAGNOSIS — J189 Pneumonia, unspecified organism: Secondary | ICD-10-CM | POA: Insufficient documentation

## 2011-09-15 DIAGNOSIS — R799 Abnormal finding of blood chemistry, unspecified: Secondary | ICD-10-CM | POA: Insufficient documentation

## 2011-09-15 DIAGNOSIS — I10 Essential (primary) hypertension: Secondary | ICD-10-CM | POA: Insufficient documentation

## 2011-09-15 DIAGNOSIS — R0789 Other chest pain: Secondary | ICD-10-CM | POA: Insufficient documentation

## 2011-09-15 DIAGNOSIS — R0602 Shortness of breath: Secondary | ICD-10-CM | POA: Insufficient documentation

## 2011-09-15 LAB — COMPREHENSIVE METABOLIC PANEL
ALT: 26 U/L (ref 0–35)
AST: 19 U/L (ref 0–37)
Albumin: 3.5 g/dL (ref 3.5–5.2)
CO2: 24 mEq/L (ref 19–32)
Chloride: 102 mEq/L (ref 96–112)
GFR calc non Af Amer: 61 mL/min — ABNORMAL LOW (ref >90)
Sodium: 139 mEq/L (ref 135–145)
Total Bilirubin: 0.3 mg/dL (ref 0.3–1.2)

## 2011-09-15 LAB — CBC
HCT: 37.1 % (ref 36.0–46.0)
Platelets: 251 10*3/uL (ref 150–400)
RDW: 16.4 % — ABNORMAL HIGH (ref 11.5–15.5)
WBC: 7 10*3/uL (ref 4.0–10.5)

## 2011-09-15 LAB — DIFFERENTIAL
Basophils Absolute: 0 10*3/uL (ref 0.0–0.1)
Basophils Relative: 0 % (ref 0–1)
Lymphocytes Relative: 30 % (ref 12–46)
Neutro Abs: 4.1 10*3/uL (ref 1.7–7.7)
Neutrophils Relative %: 59 % (ref 43–77)

## 2011-09-15 LAB — POCT I-STAT TROPONIN I

## 2011-09-15 MED ORDER — IOHEXOL 350 MG/ML SOLN
100.0000 mL | Freq: Once | INTRAVENOUS | Status: AC | PRN
  Administered 2011-09-15: 100 mL via INTRAVENOUS

## 2011-09-15 MED ORDER — MORPHINE SULFATE 4 MG/ML IJ SOLN
4.0000 mg | Freq: Once | INTRAMUSCULAR | Status: AC
  Administered 2011-09-15: 4 mg via INTRAVENOUS
  Filled 2011-09-15: qty 1

## 2011-09-15 MED ORDER — TECHNETIUM TO 99M ALBUMIN AGGREGATED
5.5000 | Freq: Once | INTRAVENOUS | Status: AC | PRN
  Administered 2011-09-15: 5.5 via INTRAVENOUS

## 2011-09-15 MED ORDER — XENON XE 133 GAS
5.0000 | GAS_FOR_INHALATION | Freq: Once | RESPIRATORY_TRACT | Status: AC | PRN
  Administered 2011-09-15: 5 via RESPIRATORY_TRACT

## 2011-09-15 MED ORDER — MORPHINE SULFATE 4 MG/ML IJ SOLN
4.0000 mg | Freq: Once | INTRAMUSCULAR | Status: AC
  Administered 2011-09-15 (×2): 4 mg via INTRAVENOUS

## 2011-09-15 MED ORDER — MORPHINE SULFATE 4 MG/ML IJ SOLN
INTRAMUSCULAR | Status: AC
  Administered 2011-09-15: 4 mg via INTRAVENOUS
  Filled 2011-09-15: qty 1

## 2011-09-15 MED ORDER — HYDROCODONE-ACETAMINOPHEN 5-325 MG PO TABS: 1.0000 | ORAL_TABLET | ORAL | Status: AC | PRN

## 2011-09-15 NOTE — Telephone Encounter (Signed)
Called and spoke with pt. She states that she has had PNA x 2 since starting on CPAP. She is concerned that CPAP could be the cause of this. She states that overall the machine works great, but the air that comes out feels cold. She wants to know if there is something that can be done to help with this. Please advise, thanks!

## 2011-09-15 NOTE — Progress Notes (Signed)
Ordered Oncotype Dx test w/ [facility].  Faxed request to [facility]. 

## 2011-09-15 NOTE — Telephone Encounter (Signed)
They have done a lot of studies on this, and found that as long as the machine and mask are kept clean, and the filters are changed, there is not an increased incidence of pna. Regarding the air, she should have heated humidity, and she can turn up the heat on the humidifier as high as she wants to get warmer air.  This will also increase the moisture in the air.

## 2011-09-15 NOTE — ED Notes (Addendum)
Pt is newly dx of pne as of yesterday. Pt was sent home with abx. Pt c/o of continual shortness of breath and right rib pain. Pt has of hx of breast ca with left side lumpectomy jan 30th.

## 2011-09-15 NOTE — ED Notes (Signed)
Off floor for testing 

## 2011-09-15 NOTE — ED Provider Notes (Signed)
V/Q low prob. R sided chest pain improved with pain meds. D/c home to f/u with PMD according to plan of Dr Judd Lien. Return for worsening symptoms or concerns   Loren Racer, MD 09/15/11 1214

## 2011-09-15 NOTE — Telephone Encounter (Signed)
I spoke with pt and is aware of [facility] response. She voiced her understanding and had no further questions

## 2011-09-15 NOTE — ED Provider Notes (Signed)
History     CSN: [id number]  Arrival date & time 09/15/11  0206   First MD Initiated Contact with Patient 09/15/11 0327      Chief Complaint  Patient presents with  . Shortness of Breath  . Abdominal Pain    right rib pain    (Consider location/radiation/quality/duration/timing/severity/associated sxs/prior treatment) HPI Comments: Has had shortness of breath, pain in the right lower rib area for the past two days.  Was seen at urgent care today and had a chest xray performed.  She was told she had pneumonia and was discharged to home.  She presents tonight with continued pain in the right side.  Better with hydrocodone, then returns.    Patient is a 66 y.o. female presenting with chest pain. The history is provided by the patient.  Chest Pain The chest pain began 2 days ago. Chest pain occurs constantly. The chest pain is worsening. The pain is associated with breathing and coughing. The severity of the pain is moderate.     Past Medical History  Diagnosis Date  . OSA (obstructive sleep apnea)   . Hypertension   . Hyperlipidemia   . Headache   . Blood transfusion   . Shortness of breath   . Pneumonia 4/12  . Cancer     lt breast  . Breast cancer   . Anemia   . Anxiety   . Depression     Dr.Lewit    Past Surgical History  Procedure Date  . Total knee arthroplasty 2006    bilateral  . Thyroid surgery 1990's  . Polypectomy 1990's  . Appendectomy 1978  . Joint replacement     rt/lt total knees  . Thyroid cyst excision 1994  . Transphenoidal / transnasal hypophysectomy / resection pituitary tumor 6/11  . Eye surgery     eye lid surg  . Breast surgery 08/2011    lt lumpectomy    Family History  Problem Relation Age of Onset  . Heart disease Father   . Clotting disorder Father   . Stroke Father   . Stroke Mother   . Diabetes Mother     History  Substance Use Topics  . Smoking status: Never Smoker   . Smokeless tobacco: Never Used  . Alcohol Use: No     OB History    Grav Para Term Preterm Abortions TAB SAB Ect Mult Living                  Review of Systems  Cardiovascular: Positive for chest pain.  All other systems reviewed and are negative.    Allergies  Lodine  Home Medications   Current Outpatient Rx  Name Route Sig Dispense Refill  . ALBUTEROL SULFATE HFA 108 (90 BASE) MCG/ACT IN AERS Inhalation Inhale 1-2 puffs into the lungs every 6 (six) hours as needed for wheezing. 1 Inhaler 0  . AZITHROMYCIN 250 MG PO TABS Oral Take 250 mg by mouth as directed. Course started 07/12/11 for 5 day supply....takes 2 tablets on day 1, then takes 1 tablet daily on days 2-5     . CLOPIDOGREL BISULFATE 75 MG PO TABS Oral Take 75 mg by mouth daily.      [name] DM-GUAIFENESIN ER 30-600 MG PO TB12 Oral Take 1 tablet by mouth every 12 (twelve) hours.    . OMEGA-3 FATTY ACIDS 1000 MG PO CAPS Oral Take 1 capsule by mouth 2 (two) times daily.      . GUAIFENESIN-CODEINE 100-10 MG/5ML PO  SYRP Oral Take 5-10 mLs by mouth every 4 (four) hours as needed.    [name] HYDROCODONE-ACETAMINOPHEN 10-325 MG PO TABS Oral Take 1 tablet by mouth every 8 (eight) hours as needed. Pain.    [name] LEVOFLOXACIN 500 MG PO TABS Oral Take 500 mg by mouth daily. To take for ten days started today on 09-13-13    . LOSARTAN POTASSIUM-HCTZ 100-25 MG PO TABS Oral Take 1 tablet by mouth daily.      [name] NIACIN ER (ANTIHYPERLIPIDEMIC) 500 MG PO TBCR Oral Take 500 mg by mouth 2 (two) times daily.     [name] SIMVASTATIN 40 MG PO TABS Oral Take 40 mg by mouth at bedtime.      [name] VERAPAMIL HCL ER 360 MG PO CP24 Oral Take 360 mg by mouth daily.        BP 159/91  Pulse 85  Resp 26  Ht 5\' 6"  (1.676 m)  Wt 295 lb (133.811 kg)  BMI 47.61 kg/m2  SpO2 98%  Physical Exam  Nursing note and vitals reviewed. Constitutional: [name], place, and time. She appears well-developed and well-nourished. No distress.  HENT:  Head: Normocephalic and atraumatic.  Neck: Normal range of motion.  Neck supple.  Cardiovascular: Normal rate and regular rhythm.  Exam reveals no gallop and no friction rub.   No murmur heard. Pulmonary/Chest: Effort normal and breath sounds normal. No respiratory distress. She has no wheezes.  Abdominal: Soft. Bowel sounds are normal. She exhibits no distension. There is no tenderness.  Musculoskeletal: Normal range of motion.  Neurological: She is alert and oriented to person, place, and time.  Skin: Skin is warm and dry. She is not diaphoretic.    ED Course  Procedures (including critical care time)  Labs Reviewed  CBC - Abnormal; Notable for the following:    RDW 16.4 (*)    All other components within normal limits  COMPREHENSIVE METABOLIC PANEL - Abnormal; Notable for the following:    Glucose, Bld 116 (*)    GFR calc non Af Amer 61 (*)    GFR calc Af Amer 70 (*)    All other components within normal limits  D-DIMER, QUANTITATIVE - Abnormal; Notable for the following:    D-Dimer, Quant 2.65 (*)    All other components within normal limits  DIFFERENTIAL  POCT I-STAT TROPONIN I   No results found.   No diagnosis found.   Date: 09/15/2011  Rate: 81  Rhythm: normal sinus rhythm  QRS Axis: normal  Intervals: normal  ST/T Wave abnormalities: normal  Conduction Disutrbances:none  Narrative Interpretation:   Old EKG Reviewed: none available    MDM  The patient has an elevated D-Dimer, but CT scan was equivocal.  Will perform V/Q scan.  At this point, the care of the patient will be signed out to Dr. Ranae Palms who will obtain the results of the V/Q and determine the final disposition.        Geoffery Lyons, MD 09/15/11 [phone]

## 2011-09-15 NOTE — ED Notes (Signed)
MD Delo at bedside. 

## 2011-09-28 ENCOUNTER — Ambulatory Visit
Admission: RE | Admit: 2011-09-28 | Discharge: 2011-09-28 | Disposition: A | Discharge status: Home / Self Care | Payer: Medicare Other | Source: Ambulatory Visit | Attending: Oncology | Admitting: Oncology

## 2011-09-28 DIAGNOSIS — C50919 Malignant neoplasm of unspecified site of unspecified female breast: Secondary | ICD-10-CM

## 2011-09-29 ENCOUNTER — Other Ambulatory Visit (HOSPITAL_COMMUNITY): Payer: Self-pay | Admitting: Family Medicine

## 2011-09-29 ENCOUNTER — Telehealth: Payer: Self-pay | Admitting: Oncology

## 2011-09-29 ENCOUNTER — Encounter: Payer: Self-pay | Admitting: Physician Assistant

## 2011-09-29 ENCOUNTER — Encounter: Payer: Self-pay | Admitting: *Deleted

## 2011-09-29 ENCOUNTER — Ambulatory Visit (HOSPITAL_COMMUNITY)
Admission: RE | Admit: 2011-09-29 | Discharge: 2011-09-29 | Disposition: A | Discharge status: Home / Self Care | Payer: Medicare Other | Source: Ambulatory Visit | Attending: Family Medicine | Admitting: Family Medicine

## 2011-09-29 ENCOUNTER — Ambulatory Visit (HOSPITAL_BASED_OUTPATIENT_CLINIC_OR_DEPARTMENT_OTHER): Payer: Medicare Other | Admitting: Physician Assistant

## 2011-09-29 VITALS — BP 148/70 | HR 76 | Temp 98.1°F | Ht 66.0 in | Wt 295.3 lb

## 2011-09-29 DIAGNOSIS — Z923 Personal history of irradiation: Secondary | ICD-10-CM

## 2011-09-29 DIAGNOSIS — J189 Pneumonia, unspecified organism: Secondary | ICD-10-CM

## 2011-09-29 DIAGNOSIS — I517 Cardiomegaly: Secondary | ICD-10-CM | POA: Insufficient documentation

## 2011-09-29 DIAGNOSIS — Z09 Encounter for follow-up examination after completed treatment for conditions other than malignant neoplasm: Secondary | ICD-10-CM | POA: Insufficient documentation

## 2011-09-29 DIAGNOSIS — C50419 Malignant neoplasm of upper-outer quadrant of unspecified female breast: Secondary | ICD-10-CM

## 2011-09-29 HISTORY — DX: Personal history of irradiation: Z92.3

## 2011-09-29 NOTE — Progress Notes (Signed)
Hematology and Oncology Follow Up Visit  [name] [id number] Nov 02, 1945 66 y.o. 09/29/2011    HPI: [name] is a 66 year old [place] woman with a newly diagnosed T1c, N0, ER/PR positive (100/100%), Ki-67 of 6%, HER-2 negative left breast carcinoma for which she underwent a left lumpectomy with sentinel node dissection revealing a 1.3 cm infiltrating ductal carcinoma and 3 clear sentinel nodes. Due for upcoming radiation simulation under the care of Dr. Antony Blackbird on 10/03/2011.  Interim History:   [name] is seen today for followup pertaining to her newly diagnosed T1c, N0,strongly ER/PR positive HER-2 negative left breast carcinoma. She is feeling well, she still has occasional tinges in her left breast, she is due for simulation for upcoming radiation on 10/03/2011. A chart review, she had been seen in mid February for chest pain, subsequent ED visit including spiral CT to reveal any evidence of pulmonary emboli, but she states she was treated for "pneumonia". She has recently completed a course of antibiotics. Currently she denies any fevers, chills, no shortness of breath. She has occasional hot flashes at night but she denies any drenching night sweats. Her appetite is excellent without nausea or emesis. No diarrhea or constipation issues. A detailed review of systems is otherwise noncontributory as noted below.  Review of Systems: Constitutional:  no weight loss, fever, night sweats and feels well Eyes: uses glasses ENT: no complaints Cardiovascular: no chest pain or dyspnea on exertion Respiratory: no cough, shortness of breath, or wheezing Neurological: no TIA or stroke symptoms Dermatological: negative Gastrointestinal: no abdominal pain, change in bowel habits, or black or bloody stools Genito-Urinary: no dysuria, trouble voiding, or hematuria Hematological and Lymphatic: negative Breast: positive for - pain at L lumpectomy  site. Musculoskeletal: negative Remaining ROS negative.  Medications:   I have reviewed the patient's current medications.  Current Outpatient Prescriptions  Medication Sig Dispense Refill  . albuterol (PROVENTIL HFA;VENTOLIN HFA) 108 (90 BASE) MCG/ACT inhaler Inhale 1-2 puffs into the lungs every 6 (six) hours as needed for wheezing.  1 Inhaler  0  . azithromycin (ZITHROMAX) 250 MG tablet Take 250 mg by mouth as directed. Course started 07/12/11 for 5 day supply....takes 2 tablets on day 1, then takes 1 tablet daily on days 2-5       . clopidogrel (PLAVIX) 75 MG tablet Take 75 mg by mouth daily.        [name] dextromethorphan-guaiFENesin (MUCINEX DM) 30-600 MG per 12 hr tablet Take 1 tablet by mouth every 12 (twelve) hours.      . fish oil-omega-3 fatty acids 1000 MG capsule Take 1 capsule by mouth 2 (two) times daily.        [name] guaiFENesin-codeine (ROBITUSSIN AC) 100-10 MG/5ML syrup Take 5-10 mLs by mouth every 4 (four) hours as needed.      [name] HYDROcodone-acetaminophen (NORCO) 10-325 MG per tablet Take 1 tablet by mouth every 8 (eight) hours as needed. Pain.      [name] levofloxacin (LEVAQUIN) 500 MG tablet Take 500 mg by mouth daily. To take for ten days started today on 09-13-13      . losartan-hydrochlorothiazide (HYZAAR) 100-25 MG per tablet Take 1 tablet by mouth daily.        . niacin (NIASPAN) 500 MG CR tablet Take 500 mg by mouth 2 (two) times daily.       . simvastatin (ZOCOR) 40 MG tablet Take 40 mg by mouth at bedtime.        . verapamil (VERELAN  PM) 360 MG 24 hr capsule Take 360 mg by mouth daily.          Allergies:  Allergies  Allergen Reactions  . Lodine (Etodolac)     itching    Physical Exam: Filed Vitals:   09/29/11 1105  BP: 148/70  Pulse: 76  Temp: 98.1 F (36.7 C)    Body mass index is 47.66 kg/(m^2). Weight: 295 lbs. HEENT:  Sclerae anicteric, conjunctivae pink.  Oropharynx clear.  No mucositis or candidiasis.   Nodes:  No cervical, supraclavicular, or axillary  lymphadenopathy palpated.  Breast Exam:  The left breast was examined, the lumpectomy scar his expected postsurgical changes appreciated but no evidence of erythema, the remaining breast is free of any obvious dominant mass effect. No nipple inversion or discharge is noted. Maybe a trace of some lymphedema. Axilla is clear.  Lungs:  Clear to auscultation bilaterally.  No crackles, rhonchi, or wheezes.   Heart:  Regular rate and rhythm.   Abdomen:  Soft, nontender.  Positive bowel sounds.  No organomegaly or masses palpated.   Musculoskeletal:  No focal spinal tenderness to palpation.  Extremities:  Benign.  No peripheral edema or cyanosis.   Skin:  Benign.   Neuro:  Nonfocal.   Lab Results: Lab Results  Component Value Date   WBC 7.0 09/15/2011   HGB 12.3 09/15/2011   HCT 37.1 09/15/2011   MCV 79.3 09/15/2011   PLT 251 09/15/2011   NEUTROABS 4.1 09/15/2011     Chemistry      Component Value Date/Time   NA 139 09/15/2011 0230   K 3.7 09/15/2011 0230   CL 102 09/15/2011 0230   CO2 24 09/15/2011 0230   BUN 12 09/15/2011 0230   CREATININE 0.95 09/15/2011 0230      Component Value Date/Time   CALCIUM 10.2 09/15/2011 0230   ALKPHOS 79 09/15/2011 0230   AST 19 09/15/2011 0230   ALT 26 09/15/2011 0230   BILITOT 0.3 09/15/2011 0230      Lab Results  Component Value Date   LABCA2 11 09/11/2011   Pathology reports: ADDITIONAL INFORMATION: 1. A sample was sent to [facility] for oncotype testing. The patient's recurrence score is 3. Those patients who had a recurrence score of 3 had an average rate of distant recurrence of 4%. (JBK:eps 09/27/11) Pecola Leisure MD Pathologist, Electronic Signature ( Signed 09/27/2011) 1. CHROMOGENIC IN-SITU HYBRIDIZATION Interpretation HER-2/NEU BY CISH - NO AMPLIFICATION OF HER-2 DETECTED. THE RATIO OF HER-2: CEP 17 SIGNALS WAS 1.00. Reference range: Ratio: HER2:CEP17 < 1.8 - gene amplification not observed Ratio: HER2:CEP 17 1.8-2.2 - equivocal  result Ratio: HER2:CEP17 > 2.2 - gene amplification observed Pecola Leisure MD Pathologist, Electronic Signature ( Signed 09/05/2011)   Radiological Studies: Ct Angio Chest W/cm &/or Wo Cm 09/15/2011  *RADIOLOGY REPORT*  Clinical Data: Shortness of breath.  Right chest and rib pain. Recent diagnosis of pneumonia.  Positive D-dimer.  History of left breast cancer.  CT ANGIOGRAPHY CHEST  Technique:  Multidetector CT imaging of the chest using the standard protocol during bolus administration of intravenous contrast. Multiplanar reconstructed images including MIPs were obtained and reviewed to evaluate the vascular anatomy.  Contrast: OMNIPAQUE IOHEXOL 350 MG/ML IV SOLN  Comparison: None.  Findings: Technically limited study due to poor contrast bolus. Although no specific evidence of pulmonary embolus is demonstrated, the study is indeterminate for diagnosis of pulmonary embolus.  If there is a high clinical suspicion of pulmonary embolus and if the patient's renal  function is good, then the patient could be reinjected and rescanned or VQ scan could be obtained.  Small esophageal hiatal hernia.  No significant lymphadenopathy in the chest.  There is patchy perihilar nodular tree in bud type infiltration suggesting airways infectious disease.  Dependent atelectasis in the lung bases.  Degenerative changes in the thoracic spine.  IMPRESSION: Indeterminate study for pulmonary embolus.  Reinjection rescanning is recommended if there is high clinical suspicion of pulmonary embolus. Focal perihilar infiltration suggesting airways infection.  Original Report Authenticated By: Marlon Pel, M.D.   Nm Pulmonary Per & Vent 09/15/2011  *RADIOLOGY REPORT*  Clinical Data:  Shortness of breath, chest pain, elevated D-dimer.  NUCLEAR MEDICINE VENTILATION - PERFUSION LUNG SCAN  Technique:  Wash-in, equilibrium, and wash-out phase ventilation images were obtained using Xe-133 gas.  Perfusion images were obtained in  multiple projections after intravenous injection of Tc- 53m MAA.  Radiopharmaceuticals:  5.0 mCi Xe-133 gas and 5.5 mCi Tc-44m MAA.  Comparison:  Chest CT 09/15/2011  Findings: Ventilation portion of the study is unremarkable.  Patchy nonsegmental perfusion defects in the lungs, without significant VQ mismatch to suggest pulmonary embolus.  IMPRESSION: Low probability study for pulmonary embolus.  Original Report Authenticated By: Cyndie Chime, M.D.       Dg Bone Density 09/28/2011  *RADIOLOGY REPORT*  Clinical Data: 66 year old post menopausal African-American female with recent diagnosis of breast cancer.  Baseline.  DUAL X-RAY ABSORPTIOMETRY (DXA) FOR BONE MINERAL DENSITY 09/28/2011:  LEFT FOREARM (1/3 RADIUS)  Bone Mineral Density (BMD):                     0.835 g/cm2 Young Adult T Score:                                  2.3 Z Score:          4.2  ASSESSMENT:  Patient's diagnostic category is NORMAL by WHO Criteria.  FRACTURE RISK: NOT INCREASED. Original Report Authenticated By: Arnell Sieving, M.D.     Assessment:  Ms. Shivley is a 66 year old [place] woman with a newly diagnosed T1c, N0, ER/PR positive (100/100%), Ki-67 of 6%, HER-2 negative left breast carcinoma for which she underwent a left lumpectomy with sentinel node dissection revealing a 1.3 cm infiltrating ductal carcinoma and 3 clear sentinel nodes. Due for upcoming radiation simulation under the care of Dr. Antony Blackbird on 10/03/2011. 2. Oncotype DX score of 3.  Case reviewed with Dr. Pierce Crane.   Plan:  Ms. Blanda will undergo radiation therapy under the care of Dr. Roselind Messier as planned. She'll return to our office on 12/07/2011 for followup with Dr. Pierce Crane to discuss adjuvant hormonal therapy. (Please note her bone density above) Patient knows that she can contact us prior to her followup if the need should arise.  This plan was reviewed with the patient, who voices understanding and agreement.   She knows to call with any changes or problems.    Dewell Monnier T, PA-C 09/29/2011

## 2011-09-29 NOTE — Telephone Encounter (Signed)
gve the pt her may 2013 appt calendar 

## 2011-10-02 ENCOUNTER — Telehealth: Payer: Self-pay | Admitting: *Deleted

## 2011-10-02 NOTE — Telephone Encounter (Signed)
10/02/2011 12:52pm Left message on patient's home answering machine to inquire about her interest in participating in the [id number] research study. Requested that patient call me back at [phone], otherwise, I will plan to meet with her tomorrow when she is here for her radiation appointment.

## 2011-10-03 ENCOUNTER — Ambulatory Visit
Admission: RE | Admit: 2011-10-03 | Discharge: 2011-10-03 | Disposition: A | Discharge status: Home / Self Care | Payer: Medicare Other | Source: Ambulatory Visit | Attending: Radiation Oncology | Admitting: Radiation Oncology

## 2011-10-03 ENCOUNTER — Other Ambulatory Visit: Payer: Self-pay | Admitting: *Deleted

## 2011-10-03 DIAGNOSIS — C50419 Malignant neoplasm of upper-outer quadrant of unspecified female breast: Secondary | ICD-10-CM

## 2011-10-03 NOTE — Progress Notes (Signed)
Met with patient to discuss RO billing.  Patient had no concerns today. 

## 2011-10-04 NOTE — Progress Notes (Signed)
DIAGNOSIS:  Stage I, left breast cancer.  NARRATIVE:  Earlier today, [name] underwent treatment planning to begin radiation therapy directed at the left breast area.  The patient was placed on the CT simulator table in the breast QUEST multi- positioning setup device.  The patient also had a custom AccuForm mold placed underneath the neck and head area for accurate positioning during treatment.  The patient also had a wire placed along the lumpectomy scar in the lateral aspect of the breast area.  She then proceeded to undergo CT scan in the treatment position.  Under virtual simulation, the lumpectomy cavity, lumpectomy scar and nipple area were outlined as well as the heart, lung and trachea.  The patient then had setup of two custom-shaped tangential beams encompassing the left breast.  Forward planning will be used to improve the dose homogeneity.  A computerized isodose plan will be generated for treatment.  TREATMENT PLAN:  The patient is to proceed with daily radiation treatments at 180 cGy per day for 25 treatments for an initial dose to the left breast of 4500 cGy.  The patient will then undergo additional planning and continue with a boost field directed at the site of presentation within the breast.  Given the depth within the breast area, the patient will be treated with a reduced field photon beam arrangement, likely an AP and left posterior oblique setup.  The patient will receive 8 additional treatments at 200 cGy per day for an additional dose of 1600 cGy and a cumulative dose to the target area of 6100 cGy.  The patient will likely be treated with a combination of 6, 10 and 18 MV photons.    ______________________________ Billie Lade, Ph.D., M.D. JDK/MEDQ  D:  10/03/2011  T:  10/04/2011  Job:  [id number]

## 2011-10-06 ENCOUNTER — Ambulatory Visit (INDEPENDENT_AMBULATORY_CARE_PROVIDER_SITE_OTHER): Payer: Medicare Other | Admitting: Pulmonary Disease

## 2011-10-06 ENCOUNTER — Encounter: Payer: Self-pay | Admitting: Pulmonary Disease

## 2011-10-06 VITALS — BP 140/78 | HR 62 | Temp 98.1°F | Ht 66.0 in | Wt 295.4 lb

## 2011-10-06 DIAGNOSIS — G4733 Obstructive sleep apnea (adult) (pediatric): Secondary | ICD-10-CM

## 2011-10-06 NOTE — Assessment & Plan Note (Signed)
The patient is wearing CPAP more compliantly, and clearly sees an improvement in her sleep and daytime alertness.  She is using a nasal pillow mask, and occasionally has some mouth opening, but does not believe this is a major issue.  I have encouraged her to work aggressively on weight loss, and also to keep up with her mask changes and supplies.  Followup with me in one year if doing well.

## 2011-10-06 NOTE — Progress Notes (Signed)
  Subjective:    Patient ID: [name], female    DOB: 1946/03/12, 66 y.o.   MRN: [id number]  HPI Patient comes in today for followup of her known severe obstructive sleep apnea.  She is doing better with her CPAP device, and feels that she is benefiting from its use.  She has found a mask that seems to fit well, and is having no issues with pressure tolerance.   Review of Systems  Constitutional: Positive for fever. Negative for unexpected weight change.  HENT: Positive for congestion. Negative for ear pain, nosebleeds, sore throat, rhinorrhea, sneezing, trouble swallowing, dental problem, postnasal drip and sinus pressure.   Eyes: Negative for redness and itching.  Respiratory: Positive for cough and wheezing. Negative for chest tightness and shortness of breath.   Cardiovascular: Negative for palpitations and leg swelling.  Gastrointestinal: Negative for nausea and vomiting.  Genitourinary: Positive for frequency. Negative for dysuria.  Musculoskeletal: Negative for joint swelling.  Skin: Positive for rash.  Neurological: Positive for headaches.  Hematological: Does not bruise/bleed easily.  Psychiatric/Behavioral: Negative for dysphoric mood. The patient is nervous/anxious.        Objective:   Physical Exam Obese female in no acute distress No skin breakdown or pressure necrosis from the CPAP mask Lower extremities with mild edema, no cyanosis Alert, does not appear to be overly sleepy, moves all 4 extremities.       Assessment & Plan:

## 2011-10-06 NOTE — Patient Instructions (Signed)
Stay on cpap, and call if having issues with tolerance Work on weight loss followup with me in one year.

## 2011-10-10 ENCOUNTER — Other Ambulatory Visit: Payer: Medicare Other | Admitting: Lab

## 2011-10-10 ENCOUNTER — Ambulatory Visit
Admission: RE | Admit: 2011-10-10 | Discharge: 2011-10-10 | Disposition: A | Discharge status: Home / Self Care | Payer: Medicare Other | Source: Ambulatory Visit | Attending: Radiation Oncology | Admitting: Radiation Oncology

## 2011-10-10 ENCOUNTER — Ambulatory Visit: Payer: Medicare Other | Admitting: *Deleted

## 2011-10-10 DIAGNOSIS — C50419 Malignant neoplasm of upper-outer quadrant of unspecified female breast: Secondary | ICD-10-CM

## 2011-10-10 MED ORDER — RADIAPLEXRX EX GEL
Freq: Once | CUTANEOUS | Status: AC
  Administered 2011-10-10: 1 via TOPICAL

## 2011-10-10 MED ORDER — ALRA NON-METALLIC DEODORANT (RAD-ONC)
1.0000 "application " | Freq: Once | TOPICAL | Status: AC
  Administered 2011-10-10: 1 via TOPICAL

## 2011-10-10 NOTE — Progress Notes (Signed)
10/10/2011 2:08pm [facility] in to [facility] this afternoon prior to RT appointment. Met with patient alone for 25 minutes to review the consent and authorization forms in their entirety. All of the patient's questions were answered and she agrees to participate in the study. Consent and authorization forms signed and dated by patient. Patient then began completing baseline questionnaires.  10/10/2011 3:00pm Following completion of questionnaires, patient proceeded to lab for collection of blood and urine samples, prior to radiation therapy appointment. Copies of signed consent and authorization forms given to patient.  10/10/2011 3:20pm Patient's skin assessed following sim on accelerator. There is no evidence of skin breakdown or erythema. Patient reports that she is using talcum powder under her breasts to prevent skin irritation from her bra. Thanked patient for her participation in the study. Explained to patient that the next study visit will occur in approximately three weeks, around the mid-point of radiation, and that assessment will consist of skin recheck only.

## 2011-10-10 NOTE — Progress Notes (Signed)
Post-sim education performed. Will need reinforcement of teaching. Given Radiation Therapy and You Booklet. Routine of clinic reviewed and knows doctor day generally every Tuesday.

## 2011-10-10 NOTE — Progress Notes (Signed)
[facility] Radiation Oncology Simulation Verification Note   Name: [name] MRN: [id number]   Date: 10/10/2011  DOB: [birth year]  Status:outpatient   DIAGNOSIS: Left breast cancer  POSITION: Patient was placed in the supine position on the treatment machine.  Isocenter and MLCS were reviewed and treatment was approved.  NARRATIVE: Patient tolerated simulation well.

## 2011-10-11 ENCOUNTER — Ambulatory Visit
Admission: RE | Admit: 2011-10-11 | Discharge: 2011-10-11 | Disposition: A | Discharge status: Home / Self Care | Payer: Medicare Other | Source: Ambulatory Visit | Attending: Radiation Oncology | Admitting: Radiation Oncology

## 2011-10-12 ENCOUNTER — Ambulatory Visit
Admission: RE | Admit: 2011-10-12 | Discharge: 2011-10-12 | Disposition: A | Discharge status: Home / Self Care | Payer: Medicare Other | Source: Ambulatory Visit | Attending: Radiation Oncology | Admitting: Radiation Oncology

## 2011-10-12 NOTE — Progress Notes (Signed)
Encounter addended by: Tessa Lerner, RN on: 10/12/2011  3:18 PM<BR>     Documentation filed: Inpatient Patient Education, Demographics Visit

## 2011-10-13 ENCOUNTER — Ambulatory Visit
Admission: RE | Admit: 2011-10-13 | Discharge: 2011-10-13 | Disposition: A | Discharge status: Home / Self Care | Payer: Medicare Other | Source: Ambulatory Visit | Attending: Radiation Oncology | Admitting: Radiation Oncology

## 2011-10-16 ENCOUNTER — Ambulatory Visit
Admission: RE | Admit: 2011-10-16 | Discharge: 2011-10-16 | Disposition: A | Discharge status: Home / Self Care | Payer: Medicare Other | Source: Ambulatory Visit | Attending: Radiation Oncology | Admitting: Radiation Oncology

## 2011-10-17 ENCOUNTER — Ambulatory Visit
Admission: RE | Admit: 2011-10-17 | Discharge: 2011-10-17 | Disposition: A | Discharge status: Home / Self Care | Payer: Medicare Other | Source: Ambulatory Visit | Attending: Radiation Oncology | Admitting: Radiation Oncology

## 2011-10-17 ENCOUNTER — Encounter: Payer: Self-pay | Admitting: Radiation Oncology

## 2011-10-17 VITALS — Wt 298.2 lb

## 2011-10-17 DIAGNOSIS — C50912 Malignant neoplasm of unspecified site of left female breast: Secondary | ICD-10-CM

## 2011-10-17 NOTE — Progress Notes (Signed)
[facility]    Radiation Oncology [address] [place]     Maryln Gottron, M.D. [place], [place] [address]               Billie Lade, M.D., Ph.D. Phone: [phone]      Molli Hazard A. Kathrynn Running, M.D. Fax: [phone]      Radene Gunning, M.D., Ph.D.         Lurline Hare, M.D.         Grayland Jack, M.D Weekly Treatment Management Note  Name: [name]     MRN: [id number]        CSN: [id number] Date: 10/17/2011      DOB: 08-18-1945  CC: [name], MD, MD         [name]    Status: Outpatient  Diagnosis: The encounter diagnosis was Breast cancer, left breast.  Current Dose: 900  Current Fraction: 5  Planned Dose: 6100 cGy  Narrative: Carola Frost was seen today for weekly treatment management. The chart was checked and port films  were reviewed. The patient is tolerating her treatment well at this time without any side effects.  Lodine allergy Current Outpatient Prescriptions  Medication Sig Dispense Refill  . albuterol (PROVENTIL HFA;VENTOLIN HFA) 108 (90 BASE) MCG/ACT inhaler Inhale 1-2 puffs into the lungs every 6 (six) hours as needed for wheezing.  1 Inhaler  0  . ALPRAZolam (XANAX) 0.25 MG tablet Take 0.25 mg by mouth at bedtime as needed.      . Cholecalciferol (VITAMIN D PO) Take by mouth daily.      . clopidogrel (PLAVIX) 75 MG tablet Take 75 mg by mouth daily.        . fish oil-omega-3 fatty acids 1000 MG capsule Take 1 capsule by mouth 2 (two) times daily.        [name] gabapentin (NEURONTIN) 300 MG capsule Take 300 mg by mouth 3 (three) times daily.      [name] HYDROcodone-acetaminophen (NORCO) 10-325 MG per tablet Take 1 tablet by mouth every 8 (eight) hours as needed. Pain.      [name] losartan-hydrochlorothiazide (HYZAAR) 100-25 MG per tablet Take 1 tablet by mouth daily.        . niacin (NIASPAN) 500 MG CR tablet Take 500 mg by mouth 2 (two) times daily.       . simvastatin (ZOCOR) 40 MG tablet Take 40 mg by mouth at bedtime.        .  verapamil (VERELAN PM) 360 MG 24 hr capsule Take 360 mg by mouth daily.         Labs:  Lab Results  Component Value Date   WBC 7.0 09/15/2011   HGB 12.3 09/15/2011   HCT 37.1 09/15/2011   MCV 79.3 09/15/2011   PLT 251 09/15/2011   Lab Results  Component Value Date   CREATININE 0.95 09/15/2011   BUN 12 09/15/2011   NA 139 09/15/2011   K 3.7 09/15/2011   CL 102 09/15/2011   CO2 24 09/15/2011   Lab Results  Component Value Date   ALT 26 09/15/2011   AST 19 09/15/2011   PHOS 3.3 11/10/2010   BILITOT 0.3 09/15/2011    Physical Examination:  weight is 298 lb 3.2 oz (135.263 kg).    Wt Readings from Last 3 Encounters:  10/17/11 298 lb 3.2 oz (135.263 kg)  10/06/11 295 lb 6.4 oz (133.993 kg)  09/29/11 295 lb 4.8 oz (133.947 kg)  Lungs - Normal respiratory effort, chest expands symmetrically. Lungs are clear to auscultation, no crackles or wheezes.  Heart has regular rhythm and rate  Abdomen is soft and non tender with normal bowel sounds The left breast shows some mild erythema in the inframammary fold area and slight hyperpigmentation changes throughout.  Assessment:  Patient tolerating treatments well  Plan: Continue treatment per original radiation prescription

## 2011-10-17 NOTE — Progress Notes (Signed)
Pt alert and oriented x 3, slight erythema on bottom of left breast area,under fold of breast sweaty, patient hasn't started to use radioplex gel yet,encouraged to start today, after rad tx and in am, no skin broken, pt has occasional throbbing in breast area,"that isn't new, " stated patient, she has had 5/33 treatments so far"3:21 PM

## 2011-10-18 ENCOUNTER — Ambulatory Visit
Admission: RE | Admit: 2011-10-18 | Discharge: 2011-10-18 | Disposition: A | Discharge status: Home / Self Care | Payer: Medicare Other | Source: Ambulatory Visit | Attending: Radiation Oncology | Admitting: Radiation Oncology

## 2011-10-19 ENCOUNTER — Ambulatory Visit
Admission: RE | Admit: 2011-10-19 | Discharge: 2011-10-19 | Disposition: A | Discharge status: Home / Self Care | Payer: Medicare Other | Source: Ambulatory Visit | Attending: Radiation Oncology | Admitting: Radiation Oncology

## 2011-10-20 ENCOUNTER — Ambulatory Visit
Admission: RE | Admit: 2011-10-20 | Discharge: 2011-10-20 | Disposition: A | Discharge status: Home / Self Care | Payer: Medicare Other | Source: Ambulatory Visit | Attending: Radiation Oncology | Admitting: Radiation Oncology

## 2011-10-23 ENCOUNTER — Ambulatory Visit
Admission: RE | Admit: 2011-10-23 | Discharge: 2011-10-23 | Disposition: A | Discharge status: Home / Self Care | Payer: Medicare Other | Source: Ambulatory Visit | Attending: Radiation Oncology | Admitting: Radiation Oncology

## 2011-10-24 ENCOUNTER — Encounter: Payer: Self-pay | Admitting: Radiation Oncology

## 2011-10-24 ENCOUNTER — Ambulatory Visit
Admission: RE | Admit: 2011-10-24 | Discharge: 2011-10-24 | Disposition: A | Discharge status: Home / Self Care | Payer: Medicare Other | Source: Ambulatory Visit | Attending: Radiation Oncology | Admitting: Radiation Oncology

## 2011-10-24 VITALS — Wt 294.0 lb

## 2011-10-24 DIAGNOSIS — C50912 Malignant neoplasm of unspecified site of left female breast: Secondary | ICD-10-CM

## 2011-10-24 NOTE — Progress Notes (Signed)
Here for weekly md under treat visit for radiation of left breast .Has completed 10 of 25 treatments. Has some tenderness of breast. No visible changes. Concerned about continued sore throat since surgery and whether radiation is affecting heart.

## 2011-10-24 NOTE — Progress Notes (Signed)
DIAGNOSIS:  Left breast cancer.  NARRATIVE:  [name] is seen today for weekly assessment.  She has 1800 cGy of a planned 6100 cGy directed at the left breast area. The patient has noticed some sensitivity within the breast area.  She also noticed some mild fatigue.  The patient also admits to some taste changes.  EXAMINATION:  Lungs:  Clear.  Heart:  Regular rhythm and rate.  Breasts: Examination of the left breast reveals it to be large and pendulous. There are some mild hyperpigmentation changes in the breast area.  IMPRESSION AND PLAN:  The patient is tolerating her treatments reasonably well except for issues as above.  The patient's radiation fields are setting up accurately.  The patient's radiation chart was checked today.  Plan is to continue with breast-conservation therapy to a cumulative dose of 6100 cGy.    ______________________________ [name], Ph.D., M.D. JDK/MEDQ  D:  10/24/2011  T:  10/24/2011  Job:  [id number]

## 2011-10-24 NOTE — Progress Notes (Signed)
DIAGNOSIS:  Left breast cancer.  NARRATIVE:  Earlier today Mrs. Doebler came underwent additional planning for radiation therapy directed at the left breast area.  The patient's treatment planning CT scan was reviewed and she subsequently had setup of a boost field directed at the site of presentation within the breast.  The patient will be treated with an AP and left posterior oblique field arrangement.  Photons were used in light of the depth within the breast area.  PLAN:  The patient is to receive 8 additional treatments at 200 cGy per day for an additional dose of 1600 cGy and a cumulative dose of 6100 cGy.    ______________________________ Billie Lade, Ph.D., M.D. JDK/MEDQ  D:  10/24/2011  T:  10/24/2011  Job:  [id number]

## 2011-10-25 ENCOUNTER — Ambulatory Visit
Admission: RE | Admit: 2011-10-25 | Discharge: 2011-10-25 | Disposition: A | Discharge status: Home / Self Care | Payer: Medicare Other | Source: Ambulatory Visit | Attending: Radiation Oncology | Admitting: Radiation Oncology

## 2011-10-26 ENCOUNTER — Ambulatory Visit
Admission: RE | Admit: 2011-10-26 | Discharge: 2011-10-26 | Disposition: A | Discharge status: Home / Self Care | Payer: Medicare Other | Source: Ambulatory Visit | Attending: Radiation Oncology | Admitting: Radiation Oncology

## 2011-10-27 ENCOUNTER — Ambulatory Visit
Admission: RE | Admit: 2011-10-27 | Discharge: 2011-10-27 | Disposition: A | Discharge status: Home / Self Care | Payer: Medicare Other | Source: Ambulatory Visit | Attending: Radiation Oncology | Admitting: Radiation Oncology

## 2011-10-30 ENCOUNTER — Ambulatory Visit
Admission: RE | Admit: 2011-10-30 | Discharge: 2011-10-30 | Disposition: A | Discharge status: Home / Self Care | Payer: Medicare Other | Source: Ambulatory Visit | Attending: Radiation Oncology | Admitting: Radiation Oncology

## 2011-10-31 ENCOUNTER — Ambulatory Visit
Admission: RE | Admit: 2011-10-31 | Discharge: 2011-10-31 | Disposition: A | Discharge status: Home / Self Care | Payer: Medicare Other | Source: Ambulatory Visit | Attending: Radiation Oncology | Admitting: Radiation Oncology

## 2011-10-31 ENCOUNTER — Encounter: Payer: Self-pay | Admitting: Radiation Oncology

## 2011-10-31 VITALS — BP 145/68 | HR 62 | Wt 298.4 lb

## 2011-10-31 DIAGNOSIS — C50912 Malignant neoplasm of unspecified site of left female breast: Secondary | ICD-10-CM

## 2011-10-31 NOTE — Progress Notes (Signed)
DIAGNOSIS:  Left breast cancer.  NARRATIVE:  [name] was seen today for weekly assessment.  She has completed 2700 cGy of a planned 6100 cGy.  The patient has noticed some sensitivity along the lateral lower aspect of the breast and low axillary region.  The patient has also noticed a poor taste in her mouth which lasts for approximately an hour after her radiation treatments.  I discussed with [name] that I am unaware of the mechanisms that caused this issue.  The patient's treatments are limited to the breast area.  EXAMINATION:  There is no secondary infection or mucosal lesion noted in the oral cavity.  Examination left breast area reveals some hyperpigmentation changes and erythema but no desquamation.  The lungs are clear.  The heart has a regular rhythm and rate.  IMPRESSION AND PLAN:  The patient is tolerating her treatments reasonably well except for issues as above.  The patient will continue her skin care regimen and will start using Dove unscented soap.  The patient's radiation fields are setting up accurately.  The patient's radiation chart was checked today.  The plan is to continue with breast conservation therapy to a cumulative dose of 6100 cGy.    ______________________________ Billie Lade, Ph.D., M.D. JDK/MEDQ  D:  10/31/2011  T:  10/31/2011  Job:  [id number]

## 2011-10-31 NOTE — Progress Notes (Signed)
Here for weekly md visit of radiation to left breast. Completed 15 of 33 radiation treatments. Patient has acid reflux most day after radiation but eats lunch just prior to treatment.Also states throat has been sore Since surgery.Some mild hyperpigmentation of axilla and left mammary fold. Vitals are stable.

## 2011-11-01 ENCOUNTER — Other Ambulatory Visit: Payer: Self-pay | Admitting: *Deleted

## 2011-11-01 ENCOUNTER — Ambulatory Visit
Admission: RE | Admit: 2011-11-01 | Discharge: 2011-11-01 | Disposition: A | Discharge status: Home / Self Care | Payer: Medicare Other | Source: Ambulatory Visit | Attending: Radiation Oncology | Admitting: Radiation Oncology

## 2011-11-01 DIAGNOSIS — C50419 Malignant neoplasm of upper-outer quadrant of unspecified female breast: Secondary | ICD-10-CM

## 2011-11-02 ENCOUNTER — Ambulatory Visit
Admission: RE | Admit: 2011-11-02 | Discharge: 2011-11-02 | Disposition: A | Discharge status: Home / Self Care | Payer: Medicare Other | Source: Ambulatory Visit | Attending: Radiation Oncology | Admitting: Radiation Oncology

## 2011-11-02 ENCOUNTER — Encounter: Payer: Self-pay | Admitting: *Deleted

## 2011-11-02 NOTE — Progress Notes (Signed)
Patient seen in radiation oncology today for week 3 visit. She reports some tenderness around her nipple area, and there is mild erythema present, without skin breakdown (grade 1 skin changes). Patient reports that she is using Radiaplex gel on her skin at present. Patient given schedule for Last Day lab appointment for blood and urine samples, to be collected on 11/22/2011 (within 2 days of last day treatment, scheduled for 11/24/2011, as samples cannot be collected on Fridays, due to shipping constraints). Patient is aware that this appointment date may change if her radiation therapy is delayed beyond 11/24/11. Thanked patient for her participation in the study.

## 2011-11-03 ENCOUNTER — Ambulatory Visit
Admission: RE | Admit: 2011-11-03 | Discharge: 2011-11-03 | Disposition: A | Discharge status: Home / Self Care | Payer: Medicare Other | Source: Ambulatory Visit | Attending: Radiation Oncology | Admitting: Radiation Oncology

## 2011-11-06 ENCOUNTER — Ambulatory Visit
Admission: RE | Admit: 2011-11-06 | Discharge: 2011-11-06 | Disposition: A | Discharge status: Home / Self Care | Payer: Medicare Other | Source: Ambulatory Visit | Attending: Radiation Oncology | Admitting: Radiation Oncology

## 2011-11-07 ENCOUNTER — Ambulatory Visit
Admission: RE | Admit: 2011-11-07 | Discharge: 2011-11-07 | Disposition: A | Discharge status: Home / Self Care | Payer: Medicare Other | Source: Ambulatory Visit | Attending: Radiation Oncology | Admitting: Radiation Oncology

## 2011-11-07 ENCOUNTER — Encounter: Payer: Self-pay | Admitting: Radiation Oncology

## 2011-11-07 VITALS — Wt 298.2 lb

## 2011-11-07 DIAGNOSIS — C50912 Malignant neoplasm of unspecified site of left female breast: Secondary | ICD-10-CM

## 2011-11-07 NOTE — Progress Notes (Signed)
DIAGNOSIS:  Left breast cancer.  NARRATIVE:  [name] is seen today for weekly assessment.  She has completed 3600 cGy of a planned 6100 cGy directed at the left breast area.  The patient is noticing some soreness along the nipple area and underneath her left arm.  She continues have a bitter taste for approximately an hour after receiving her radiation therapy.  This clears up with drinking liquids.  The patient has also noticed some discomfort along the left parasternal area.  She denies any shortness of breath.  The patient continues to be concerned about potential effects on her heart.  I have discussed with her that her radiation fields are outside of the heart border based on her treatment planning CT scans.  PHYSICAL EXAMINATION:  The patient's lungs are clear.  The heart has a regular rhythm and rate.  Palpation along the sternum and left parasternal area reveals no appreciable point tenderness.  Examination of the left breast area reveals hyperpigmentation changes and erythema but no skin breakdown at this point.  IMPRESSION AND PLAN:  The patient is tolerating her treatments reasonably well except for issues as above.  The patient's radiation fields are setting up accurately.  The patient's radiation chart was checked today.  Plan is to continue with breast conservation therapy to a cumulative dose of 6100 cGy.    ______________________________ [name], Ph.D., M.D. JDK/MEDQ  D:  11/07/2011  T:  11/07/2011  Job:  [id number]

## 2011-11-07 NOTE — Progress Notes (Signed)
Pt alert and oriented x3, 20/33 left breast txs completed, nipple area still very sore and under arm, still has bitter taste with tradition stated,gets better once she gets home and drinks crystal light drink, only drinking 1 pepsi daily now, still c/o pain lasts 1 minute in chest area, concerned ever since she saw on tv about radiation to breast affecting the heart,"the pain comes and goes, last timwe right befotre coming here for treatment, may be anxiety she stated also,will discuss with MD, skin intact, 2:58 PM

## 2011-11-08 ENCOUNTER — Ambulatory Visit
Admission: RE | Admit: 2011-11-08 | Discharge: 2011-11-08 | Disposition: A | Discharge status: Home / Self Care | Payer: Medicare Other | Source: Ambulatory Visit | Attending: Radiation Oncology | Admitting: Radiation Oncology

## 2011-11-09 ENCOUNTER — Ambulatory Visit
Admission: RE | Admit: 2011-11-09 | Discharge: 2011-11-09 | Disposition: A | Discharge status: Home / Self Care | Payer: Medicare Other | Source: Ambulatory Visit | Attending: Radiation Oncology | Admitting: Radiation Oncology

## 2011-11-10 ENCOUNTER — Ambulatory Visit
Admission: RE | Admit: 2011-11-10 | Discharge: 2011-11-10 | Disposition: A | Discharge status: Home / Self Care | Payer: Medicare Other | Source: Ambulatory Visit | Attending: Radiation Oncology | Admitting: Radiation Oncology

## 2011-11-13 ENCOUNTER — Ambulatory Visit
Admission: RE | Admit: 2011-11-13 | Discharge: 2011-11-13 | Disposition: A | Discharge status: Home / Self Care | Payer: Medicare Other | Source: Ambulatory Visit | Attending: Radiation Oncology | Admitting: Radiation Oncology

## 2011-11-14 ENCOUNTER — Encounter: Payer: Self-pay | Admitting: Radiation Oncology

## 2011-11-14 ENCOUNTER — Ambulatory Visit
Admission: RE | Admit: 2011-11-14 | Discharge: 2011-11-14 | Disposition: A | Discharge status: Home / Self Care | Payer: Medicare Other | Source: Ambulatory Visit | Attending: Radiation Oncology | Admitting: Radiation Oncology

## 2011-11-14 VITALS — Wt 298.5 lb

## 2011-11-14 DIAGNOSIS — C50912 Malignant neoplasm of unspecified site of left female breast: Secondary | ICD-10-CM

## 2011-11-14 NOTE — Progress Notes (Signed)
Here for weekly under treat visit of radiation to left breast. Has completed  25 of 25 left breast treatments, starts boost tomorrow. Increased reflux after treatment.Hyperpigmentation with peeling of left mammary fold.Der.Kinard will inform what to apply.

## 2011-11-14 NOTE — Progress Notes (Signed)
  Radiation Oncology         [phone] ________________________________  Name: [name] MRN: [id number]  Date: 11/14/2011  DOB: January 08, 1946  Simulation Verification Note  Status: outpatient  NARRATIVE: The patient was brought to the treatment unit and placed in the planned treatment position. The clinical setup was verified. Then port films were obtained and uploaded to the radiation oncology medical record software.  The treatment beams were carefully compared against the planned radiation fields. The position location and shape of the radiation fields was reviewed. They targeted volume of tissue appears to be appropriately covered by the radiation beams. Organs at risk appear to be excluded as planned.  Based on my personal review, I approved the simulation verification. The patient's treatment will proceed as planned.  -----------------------------------  Billie Lade, PhD, MD

## 2011-11-14 NOTE — Progress Notes (Signed)
Silvadene cream given to pt per Md verbal request, gave instructions to pt how to apply, wash hands first with soap and water, apply to affected area under fold of breast, after rad tx, bedtime, and am prn, must reomve cream before reapplying ,patient gave verbal understanding, applied small amount cream to affected area of skin before patient d/c home 3:38 PM

## 2011-11-14 NOTE — Progress Notes (Signed)
Encounter addended by: Billie Lade, MD on: 11/14/2011  6:32 PM<BR>     Documentation filed: Notes Section

## 2011-11-15 ENCOUNTER — Ambulatory Visit
Admission: RE | Admit: 2011-11-15 | Discharge: 2011-11-15 | Disposition: A | Discharge status: Home / Self Care | Payer: Medicare Other | Source: Ambulatory Visit | Attending: Radiation Oncology | Admitting: Radiation Oncology

## 2011-11-15 MED ORDER — SILVER SULFADIAZINE 1 % EX CREA: TOPICAL_CREAM | Freq: Two times a day (BID) | CUTANEOUS | Status: DC

## 2011-11-15 NOTE — Progress Notes (Signed)
Encounter addended by: Delynn Flavin, RN on: 11/15/2011  6:39 PM<BR>     Documentation filed: Orders

## 2011-11-15 NOTE — Progress Notes (Signed)
DIAGNOSIS:  Left breast cancer.  NARRATIVE:  Mrs. Matzen is seen today for weekly assessment.  She has completed 4500 cGy of a planned 6100 cGy directed at the left breast area.  The patient is starting to have a lot of discomfort in the breast area.  She also is having some fatigue.  PHYSICAL EXAMINATION:  The lungs are clear.  The heart has a regular rhythm and rate.  Exam of the left breast area reveals hyperpigmentation changes and erythema. In the inframammary fold area, the patient has impending moist desquamation.  IMPRESSION AND PLAN:  The patient is tolerating her treatments reasonably well except for issues as above.  The patient's radiation fields are setting up accurately.  The patient's radiation chart was checked today.  Given the patient's skin reaction in the inframammary fold area, she will be placed on Silvadene for this treatment area.  The patient will begin her boost field tomorrow and will receive 8 treatments directed at the upper outer aspect of the left breast.    ______________________________ Billie Lade, Ph.D., M.D. JDK/MEDQ  D:  11/14/2011  T:  11/15/2011  Job:  [id number]

## 2011-11-16 ENCOUNTER — Ambulatory Visit
Admission: RE | Admit: 2011-11-16 | Discharge: 2011-11-16 | Disposition: A | Discharge status: Home / Self Care | Payer: Medicare Other | Source: Ambulatory Visit | Attending: Radiation Oncology | Admitting: Radiation Oncology

## 2011-11-16 MED ORDER — SILVER SULFADIAZINE 1 % EX CREA
TOPICAL_CREAM | Freq: Two times a day (BID) | CUTANEOUS | Status: DC
  Administered 2011-11-14: 1 via TOPICAL

## 2011-11-16 NOTE — Progress Notes (Signed)
Encounter addended by: Tessa Lerner, RN on: 11/16/2011  9:37 AM<BR>     Documentation filed: Inpatient MAR, Orders

## 2011-11-16 NOTE — Progress Notes (Signed)
Encounter addended by: Tessa Lerner, RN on: 11/16/2011  9:35 AM<BR>     Documentation filed: Inpatient MAR

## 2011-11-17 ENCOUNTER — Ambulatory Visit
Admission: RE | Admit: 2011-11-17 | Discharge: 2011-11-17 | Disposition: A | Discharge status: Home / Self Care | Payer: Medicare Other | Source: Ambulatory Visit | Attending: Radiation Oncology | Admitting: Radiation Oncology

## 2011-11-20 ENCOUNTER — Ambulatory Visit
Admission: RE | Admit: 2011-11-20 | Discharge: 2011-11-20 | Disposition: A | Discharge status: Home / Self Care | Payer: Medicare Other | Source: Ambulatory Visit | Attending: Radiation Oncology | Admitting: Radiation Oncology

## 2011-11-21 ENCOUNTER — Ambulatory Visit
Admission: RE | Admit: 2011-11-21 | Discharge: 2011-11-21 | Disposition: A | Discharge status: Home / Self Care | Payer: Medicare Other | Source: Ambulatory Visit | Attending: Radiation Oncology | Admitting: Radiation Oncology

## 2011-11-21 DIAGNOSIS — C50912 Malignant neoplasm of unspecified site of left female breast: Secondary | ICD-10-CM

## 2011-11-21 NOTE — Progress Notes (Signed)
   Department of Radiation Oncology  Phone:  [phone] Fax:        [phone]   Weekly Management Note Current Dose: 55  Gy  Projected Dose:61  Gy   Narrative:  The patient presents for routine under treatment assessment.  Port film x-rays were reviewed.  The chart was checked.  Overall the patient is doing better this week since starting on Silvadene. Patient does have some sharp shooting pains within the breast along the medial and lateral aspect of the breast.  Physical Findings: The left breast shows dry desquamation and hyperpigmentation changes but no active moist desquamation.  Impression:  The patient is tolerating radiation better.  Plan:  Continue treatment as planned.   Billie Lade, M.D.

## 2011-11-21 NOTE — Progress Notes (Signed)
Here for weekly under treat visit for left breast cancer radiation. Patient has completed 30 of 33 treatments.Has occassional shooting pain which informed is normal. Desquamation of left mammary fold improved with use of silvadene. Generalized fatigue.

## 2011-11-22 ENCOUNTER — Ambulatory Visit
Admission: RE | Admit: 2011-11-22 | Discharge: 2011-11-22 | Disposition: A | Discharge status: Home / Self Care | Payer: Medicare Other | Source: Ambulatory Visit | Attending: Radiation Oncology | Admitting: Radiation Oncology

## 2011-11-22 ENCOUNTER — Encounter: Payer: Medicare Other | Admitting: *Deleted

## 2011-11-22 ENCOUNTER — Other Ambulatory Visit: Payer: Medicare Other | Admitting: Lab

## 2011-11-22 DIAGNOSIS — C50419 Malignant neoplasm of upper-outer quadrant of unspecified female breast: Secondary | ICD-10-CM

## 2011-11-22 NOTE — Progress Notes (Signed)
Patient in to clinic this afternoon for radiation therapy. Due to therapy completion scheduled for a Friday weekday, and inability to collect and ship research samples on Fridays, patient samples collected today within the 2 day window allowed per study. Patient proceeded to lab for collection of blood and urine samples. Patient then proceeded to Radiation Oncology for daily treatment. Following completion of RT, patient's skin was assessed and found to have areas of dry desquamation under in the inframammary fold and in the axilla. She reports that she is now using silvadene on her skin. Questionnaires were completed by the patient in the clinic today. Thanked patient for her participation in the study. Patient is aware that the 1 month evaluation will occur approximately 3-5 weeks following the end of radiation therapy, currently scheduled for this Friday.

## 2011-11-23 ENCOUNTER — Ambulatory Visit
Admission: RE | Admit: 2011-11-23 | Discharge: 2011-11-23 | Disposition: A | Discharge status: Home / Self Care | Payer: Medicare Other | Source: Ambulatory Visit | Attending: Radiation Oncology | Admitting: Radiation Oncology

## 2011-11-24 ENCOUNTER — Ambulatory Visit
Admission: RE | Admit: 2011-11-24 | Discharge: 2011-11-24 | Disposition: A | Discharge status: Home / Self Care | Payer: Medicare Other | Source: Ambulatory Visit | Attending: Radiation Oncology | Admitting: Radiation Oncology

## 2011-11-24 ENCOUNTER — Encounter: Payer: Self-pay | Admitting: Radiation Oncology

## 2011-11-24 VITALS — BP 150/64 | HR 65 | Wt 297.8 lb

## 2011-11-24 DIAGNOSIS — C50912 Malignant neoplasm of unspecified site of left female breast: Secondary | ICD-10-CM

## 2011-11-24 NOTE — Progress Notes (Signed)
Patient presents to the clinic today unaccompanied for PUT with Dr. Kathrynn Running following final treatment. Patient is alert and oriented to person, place, and time. No distress noted. Steady gait noted. Pleasant affect noted. Patient denies pain at this time. Patient schedule for a follow up appointment with Dr. Roselind Messier on 12/20/2011 at 1000. Patient reports using Silvadene cream twice daily as directed on left/treated breast. Reviewed and reinforced skin care. Patient verbalized understanding. Patient verbalized moist desquamation under left/treated breast. Hyperpigmentation/tanning of treatment breast noted. Patient denies nausea, vomiting, headache, dizziness or diarrhea. Patient reports fatigue. Weight has remained stable. Reported all findings to Dr. Kathrynn Running.   Patient not taking steroids.  Patient has follow up appt with Kinard 12/20/2011

## 2011-11-24 NOTE — Progress Notes (Signed)
  Radiation Oncology         [phone] ________________________________  Name: [name] MRN: [id number]  Date: 11/24/2011  DOB: [birth year]  Weekly Radiation Therapy Management  Current Dose: 61 Gy     Planned Dose:  61 Gy  Narrative . . . . . . . . The patient presents for routine under treatment assessment.                                  The patient is using antibiotic cream for foci of desquamation.                                 Set-up images were reviewed.                                 The chart was checked. Physical Findings. . . the patient was in no acute distress today. Her left breast showed diffuse hyperpigmentation. There was a linear region of desquamation in the upper-outer quadrant which showed no evidence of infection. There were small foci of moist desquamation.. Impression . . . . . . . 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 Plan . . . . . . . . . . . [name] the patient will complete her course of radiation today and return for routine followup in one month. I advised her regarding ongoing skin care in the next few weeks and months. The patient will contact our office or return if she has additional questions or concerns related to healing or experiences delayed healing. Otherwise, she will see Dr. Roselind Messier for routine followup in one month..  ________________________________  Artist Pais Kathrynn Running, M.D.

## 2011-11-28 ENCOUNTER — Encounter: Payer: Self-pay | Admitting: Radiation Oncology

## 2011-11-28 NOTE — Progress Notes (Signed)
  Radiation Oncology         [phone] ________________________________  Name: [name] MRN: [id number]  Date: 11/28/2011  DOB: Sep 02, 1945  End of Treatment Note  Diagnosis:   Stage I left Breast Cancer     Indication for treatment:  Breast conservation       Radiation treatment dates:   10/29/11 - 11/24/11  Site/dose:   Left breast, 6100 cGy in 33 fx's  Beams/energy:   Tangents, initially; photon boost ap/lpo; 18, 10, 6 MV photons    Narrative: The patient tolerated radiation treatment relatively well.   She did have some fatigue as well as discomfort and itching within the left breast. She also developed moist desquamation which responded to Silvadene.  Plan: The patient has completed radiation treatment. The patient will return to radiation oncology clinic for routine followup in one month. I advised them to call or return sooner if they have any questions or concerns related to their recovery or treatment.  -----------------------------------  Billie Lade, PhD, MD

## 2011-12-01 ENCOUNTER — Telehealth: Payer: Self-pay | Admitting: *Deleted

## 2011-12-01 NOTE — Telephone Encounter (Signed)
per md being out of the office left patient voicemail informing them and the new date and time on 01-17-2012 

## 2011-12-01 NOTE — Telephone Encounter (Signed)
per md being out of the office left patient voicemail informing them and the new date and time on 01-17-2012

## 2011-12-07 ENCOUNTER — Other Ambulatory Visit: Payer: Medicare Other | Admitting: Lab

## 2011-12-07 ENCOUNTER — Ambulatory Visit: Payer: Medicare Other | Admitting: Oncology

## 2011-12-07 ENCOUNTER — Ambulatory Visit (INDEPENDENT_AMBULATORY_CARE_PROVIDER_SITE_OTHER): Payer: Medicare Other | Admitting: Surgery

## 2011-12-07 ENCOUNTER — Encounter (INDEPENDENT_AMBULATORY_CARE_PROVIDER_SITE_OTHER): Payer: Self-pay | Admitting: Surgery

## 2011-12-07 VITALS — BP 132/74 | HR 78 | Resp 16 | Ht 66.0 in | Wt 292.0 lb

## 2011-12-07 DIAGNOSIS — Z853 Personal history of malignant neoplasm of breast: Secondary | ICD-10-CM

## 2011-12-07 NOTE — Progress Notes (Signed)
Subjective:     Patient ID: [name], female   DOB: [birth year], 66 y.o.   MRN: [id number]  HPI She is here for long-term followup of her left breast cancer. She is now finished radiation therapy approximately 2 weeks ago and is doing well  Review of Systems     Objective:   Physical Exam    On exam, her incision is well-healed. There is also mild fullness of the incision. There are mild radiation changes of the breast a left Assessment:     Long-term followup left breast cancer. She'll be seeing Dr. Donnie Coffin later today    Plan:     I will see her back in 6 months

## 2011-12-08 ENCOUNTER — Telehealth (INDEPENDENT_AMBULATORY_CARE_PROVIDER_SITE_OTHER): Payer: Self-pay | Admitting: Surgery

## 2011-12-08 NOTE — Telephone Encounter (Signed)
I called the pt and she states she forgot to mention yesterday that since her January surgery, she is having some throat problems.  For a couple weeks after surgery, she had a sore throat.  That has continued and led to being hoarse in the morning.  It is better than it was but still going on.  The hoarseness wears off by the end of the day but returns in the am.  She feels sure it is from surgery.  I told her it may have been from the endotracheal tube and I wondered if there was some damage done.  I said she may need to see an ENT specialist.  She has seen Dr Lazarus Salines in the past, last time being 2007.  I will inform Dr Magnus Ivan.

## 2011-12-20 ENCOUNTER — Ambulatory Visit: Payer: Medicare Other | Admitting: Radiation Oncology

## 2011-12-27 ENCOUNTER — Encounter: Payer: Self-pay | Admitting: Radiation Oncology

## 2011-12-27 ENCOUNTER — Ambulatory Visit
Admission: RE | Admit: 2011-12-27 | Discharge: 2011-12-27 | Disposition: A | Discharge status: Home / Self Care | Payer: Medicare Other | Source: Ambulatory Visit | Attending: Radiation Oncology | Admitting: Radiation Oncology

## 2011-12-27 ENCOUNTER — Encounter: Payer: Self-pay | Admitting: *Deleted

## 2011-12-27 VITALS — BP 167/83 | HR 64 | Temp 98.3°F | Wt 294.1 lb

## 2011-12-27 DIAGNOSIS — C50912 Malignant neoplasm of unspecified site of left female breast: Secondary | ICD-10-CM

## 2011-12-27 NOTE — Progress Notes (Signed)
Radiation Oncology         [phone] ________________________________  Name: [name] MRN: [id number]  Date: 12/27/2011  DOB: May 10, 1946  Follow-Up Visit Note  CC: [name], MD, MD  Shelly Rubenstein, MD  Diagnosis:   Left breast cancer  Interval Since Last Radiation:  1 months  Narrative:  The patient returns today for routine follow-up.  She overall is doing well. She has mild fatigue. The patient occasionally will have some sharp shooting pains within the breast. She denies any nipple discharge or bleeding. Patient did see Dr. [name] recently and received a good report. She is scheduled to see Dr. Dr. Donnie Coffin in late June.                              ALLERGIES:  is allergic to lodine.  Meds: Current Outpatient Prescriptions  Medication Sig Dispense Refill  . albuterol (PROVENTIL HFA;VENTOLIN HFA) 108 (90 BASE) MCG/ACT inhaler Inhale 1-2 puffs into the lungs every 6 (six) hours as needed for wheezing.  1 Inhaler  0  . Cholecalciferol (VITAMIN D PO) Take by mouth daily.      . clopidogrel (PLAVIX) 75 MG tablet Take 75 mg by mouth daily.        . fish oil-omega-3 fatty acids 1000 MG capsule Take 1 capsule by mouth 2 (two) times daily.        [name] gabapentin (NEURONTIN) 300 MG capsule Take 300 mg by mouth 3 (three) times daily.      [name] HYDROcodone-acetaminophen (NORCO) 10-325 MG per tablet Take 1 tablet by mouth every 8 (eight) hours as needed. Pain.      [name] losartan-hydrochlorothiazide (HYZAAR) 100-25 MG per tablet Take 1 tablet by mouth daily.        . niacin (NIASPAN) 500 MG CR tablet Take 500 mg by mouth 2 (two) times daily.       . silver sulfADIAZINE (SILVADENE) 1 % cream Apply topically 2 (two) times daily. Apply  Cream under fold of breast to skin  After radiation treatment, bedtime, and am prn,remove cream before reapplying, wash with warm soap and water      . simvastatin (ZOCOR) 40 MG tablet Take 40 mg by mouth at bedtime.        . verapamil (VERELAN PM) 360  MG 24 hr capsule Take 360 mg by mouth daily.         No current facility-administered medications for this encounter.   Facility-Administered Medications Ordered in Other Encounters  Medication Dose Route Frequency Provider Last Rate Last Dose  . silver sulfADIAZINE (SILVADENE) 1 % cream   Topical BID Billie Lade, MD   1 application at 11/14/11 1308    Physical Findings: The patient is in no acute distress. Patient is alert and oriented.  weight is 294 lb 1.6 oz (133.403 kg). Her temperature is 98.3 F (36.8 C). Her blood pressure is 167/83 and her pulse is 64. [name]  Patient's skin is well healed. She continues to have some edema in the breast and hyperpigmentation changes. There is no dominant mass appreciated breast nipple discharge or bleeding.  Lab Findings: Lab Results  Component Value Date   WBC 7.0 09/15/2011   HGB 12.3 09/15/2011   HCT 37.1 09/15/2011   MCV 79.3 09/15/2011   PLT 251 09/15/2011    @LASTCHEM @  Radiographic Findings: No results found.  Impression:  The patient is recovering from the effects of radiation.  I Told patient she does not need to use her Silvadene anymore at this time.  Plan:  Routine followup in 6 months.  -----------------------------------  Billie Lade, PhD, MD

## 2011-12-27 NOTE — Progress Notes (Signed)
12/27/2011 9:30am Patient in to clinic today for Month 1 visit post-RT. Skin is intact but shows some darkened areas and is healing well. Patient continues to use silvadene on her skin at present. She is scheduled to see Dr. Donnie Coffin in June to discuss hormonal therapy. Month 2 visit will be conducted at that time. Questionnaires completed by patient in clinic today.

## 2011-12-27 NOTE — Progress Notes (Signed)
Here for routine one month follow up completion of left breast radiation.Skin has healed very nicely just some continued hyperpigmentation. Scheduled for fu with Dr.Rubin 01/17/12.Occasional shooting pain.

## 2012-01-12 ENCOUNTER — Other Ambulatory Visit: Payer: Self-pay | Admitting: Neurosurgery

## 2012-01-12 DIAGNOSIS — D497 Neoplasm of unspecified behavior of endocrine glands and other parts of nervous system: Secondary | ICD-10-CM

## 2012-01-17 ENCOUNTER — Encounter: Payer: Self-pay | Admitting: *Deleted

## 2012-01-17 ENCOUNTER — Ambulatory Visit (HOSPITAL_BASED_OUTPATIENT_CLINIC_OR_DEPARTMENT_OTHER): Payer: Medicare Other | Admitting: Oncology

## 2012-01-17 ENCOUNTER — Other Ambulatory Visit (HOSPITAL_BASED_OUTPATIENT_CLINIC_OR_DEPARTMENT_OTHER): Payer: Medicare Other | Admitting: Lab

## 2012-01-17 ENCOUNTER — Telehealth: Payer: Self-pay | Admitting: *Deleted

## 2012-01-17 VITALS — BP 166/69 | HR 75 | Temp 98.2°F | Ht 66.0 in | Wt 298.1 lb

## 2012-01-17 DIAGNOSIS — C50419 Malignant neoplasm of upper-outer quadrant of unspecified female breast: Secondary | ICD-10-CM

## 2012-01-17 DIAGNOSIS — C50912 Malignant neoplasm of unspecified site of left female breast: Secondary | ICD-10-CM

## 2012-01-17 DIAGNOSIS — Z170 Estrogen receptor positive status [ER+]: Secondary | ICD-10-CM

## 2012-01-17 DIAGNOSIS — E559 Vitamin D deficiency, unspecified: Secondary | ICD-10-CM

## 2012-01-17 LAB — COMPREHENSIVE METABOLIC PANEL
ALT: 19 U/L (ref 0–35)
AST: 11 U/L (ref 0–37)
Albumin: 3.8 g/dL (ref 3.5–5.2)
Calcium: 10.1 mg/dL (ref 8.4–10.5)
Chloride: 104 mEq/L (ref 96–112)
Potassium: 3.8 mEq/L (ref 3.5–5.3)
Sodium: 142 mEq/L (ref 135–145)

## 2012-01-17 LAB — CBC WITH DIFFERENTIAL/PLATELET
BASO%: 0.3 % (ref 0.0–2.0)
EOS%: 1.9 % (ref 0.0–7.0)
HGB: 11 g/dL — ABNORMAL LOW (ref 11.6–15.9)
MCH: 26.4 pg (ref 25.1–34.0)
MCHC: 32 g/dL (ref 31.5–36.0)
RBC: 4.18 10*6/uL (ref 3.70–5.45)
RDW: 16.3 % — ABNORMAL HIGH (ref 11.2–14.5)
lymph#: 1.6 10*3/uL (ref 0.9–3.3)

## 2012-01-17 MED ORDER — ANASTROZOLE 1 MG PO TABS: 1.0000 mg | ORAL_TABLET | Freq: Every day | ORAL | Status: AC

## 2012-01-17 NOTE — Telephone Encounter (Signed)
Gave patient appointment for 04-19-2012 starting at 2:30 with labs printed out calendar and gave to the patient

## 2012-01-17 NOTE — Progress Notes (Signed)
Patient in to clinic today for routine medical oncology follow-up visit. Month 2 study visit conducted during this time. Patient's skin is intact with mild hyperpigmentation, particularly in the axillary region. Patient was given a printed calendar with updated appointment for October 2013 (Month 6 visit). Thanked patient for her participation in the study.

## 2012-01-17 NOTE — Patient Instructions (Addendum)

## 2012-01-17 NOTE — Progress Notes (Signed)
Hematology and Oncology Follow Up Visit  [name] [id number] [birth year] 66 y.o. 01/17/2012    HPI: [name] is a 66 year old [place] woman with a newly diagnosed T1c, N0, ER/PR positive (100/100%), Ki-67 of 6%, HER-2 negative left breast carcinoma for which she underwent a left lumpectomy with sentinel node dissection revealing a 1.3 cm infiltrating ductal carcinoma and 3 clear sentinel nodes. Interim History:   Patient returns for followup. She completed radiation therapy on 11/24/2011. She tolerated well with minimal side effects. She feels otherwise pretty well energy level is good. Review of Systems: Constitutional:  no weight loss, fever, night sweats and feels well Eyes: uses glasses ENT: no complaints Cardiovascular: no chest pain or dyspnea on exertion Respiratory: no cough, shortness of breath, or wheezing Neurological: no TIA or stroke symptoms Dermatological: negative Gastrointestinal: no abdominal pain, change in bowel habits, or black or bloody stools Genito-Urinary: no dysuria, trouble voiding, or hematuria Hematological and Lymphatic: negative Breast: positive for - pain at L lumpectomy site. Musculoskeletal: negative Remaining ROS negative.  Medications:   I have reviewed the patient's current medications.  Current Outpatient Prescriptions  Medication Sig Dispense Refill  . albuterol (PROVENTIL HFA;VENTOLIN HFA) 108 (90 BASE) MCG/ACT inhaler Inhale 1-2 puffs into the lungs every 6 (six) hours as needed for wheezing.  1 Inhaler  0  . Cholecalciferol (VITAMIN D PO) Take by mouth daily.      . clopidogrel (PLAVIX) 75 MG tablet Take 75 mg by mouth daily.        . fish oil-omega-3 fatty acids 1000 MG capsule Take 1 capsule by mouth 2 (two) times daily.        [name] gabapentin (NEURONTIN) 300 MG capsule Take 300 mg by mouth 3 (three) times daily.      [name] HYDROcodone-acetaminophen (NORCO) 10-325 MG per tablet Take 1 tablet by mouth every 8 (eight)  hours as needed. Pain.      [name] losartan-hydrochlorothiazide (HYZAAR) 100-25 MG per tablet Take 1 tablet by mouth daily.        . niacin (NIASPAN) 500 MG CR tablet Take 500 mg by mouth 2 (two) times daily.       . silver sulfADIAZINE (SILVADENE) 1 % cream Apply topically 2 (two) times daily. Apply  Cream under fold of breast to skin  After radiation treatment, bedtime, and am prn,remove cream before reapplying, wash with warm soap and water      . simvastatin (ZOCOR) 40 MG tablet Take 40 mg by mouth at bedtime.        . verapamil (VERELAN PM) 360 MG 24 hr capsule Take 360 mg by mouth daily.         No current facility-administered medications for this visit.   Facility-Administered Medications Ordered in Other Visits  Medication Dose Route Frequency Provider Last Rate Last Dose  . silver sulfADIAZINE (SILVADENE) 1 % cream   Topical BID Billie Lade, MD   1 application at 11/14/11 4098    Allergies:  Allergies  Allergen Reactions  . Lodine (Etodolac)     itching    Physical Exam: Filed Vitals:   01/17/12 1448  BP: 166/69  Pulse: 75  Temp: 98.2 F (36.8 C)    Body mass index is 48.11 kg/(m^2). Weight: 295 lbs. HEENT:  Sclerae anicteric, conjunctivae pink.  Oropharynx clear.  No mucositis or candidiasis.   Nodes:  No cervical, supraclavicular, or axillary lymphadenopathy palpated.  Breast Exam:  The left breast was examined, the right and left  breasts are free of any obvious masses. The left breast is status post lumpectomy and radiation. There are pigmentation changes throughout the left breast but no obvious evidence of skin breakdown. The axilla bilaterally are negative the Lungs:  Clear to auscultation bilaterally.  No crackles, rhonchi, or wheezes.   Heart:  Regular rate and rhythm.   Abdomen:  Soft, nontender.  Positive bowel sounds.  No organomegaly or masses palpated.   Musculoskeletal:  No focal spinal tenderness to palpation.  Extremities:  Benign.  No peripheral edema or  cyanosis.   Skin:  Benign.   Neuro:  Nonfocal.   Lab Results: Lab Results  Component Value Date   WBC 7.5 01/17/2012   HGB 11.0* 01/17/2012   HCT 34.5* 01/17/2012   MCV 82.5 01/17/2012   PLT 277 01/17/2012   NEUTROABS 5.3 01/17/2012     Chemistry      Component Value Date/Time   NA 139 09/15/2011 0230   K 3.7 09/15/2011 0230   CL 102 09/15/2011 0230   CO2 24 09/15/2011 0230   BUN 12 09/15/2011 0230   CREATININE 0.95 09/15/2011 0230      Component Value Date/Time   CALCIUM 10.2 09/15/2011 0230   ALKPHOS 79 09/15/2011 0230   AST 19 09/15/2011 0230   ALT 26 09/15/2011 0230   BILITOT 0.3 09/15/2011 0230      Lab Results  Component Value Date   LABCA2 11 09/11/2011    Assessment:  [name] is a 66 year old [place] woman with a newly diagnosed T1c, N0, ER/PR positive (100/100%), Ki-67 of 6%, HER-2 negative left breast carcinoma for which she underwent a left lumpectomy with sentinel node dissection revealing a 1.3 cm infiltrating ductal carcinoma and 3 clear sentinel nodes.  2. Oncotype DX score of 3. Status post completion of radiation  Case reviewed with Dr. Pierce Crane.   Plan:   This plan was reviewed with the patient, who voices understanding and agreement.  We spent time today discussing adjuvant hormonal therapy in the form of a rib fracture. Her bone density is normal. She is taking additional vitamin D. I discussed side effects of Arimidex and gave her a printed information about this. I will plan to see her in followup in 3 months time to review side effects with her. Jamya Starry ,01/17/2012

## 2012-01-18 ENCOUNTER — Telehealth: Payer: Self-pay | Admitting: *Deleted

## 2012-01-18 NOTE — Progress Notes (Signed)
Received return phone call from patient today stating that she is not using any skin products on her treated skin.

## 2012-01-18 NOTE — Telephone Encounter (Signed)
Left message on patient's home answering machine to contact me about skin products currently being used on the treated breast. Patient was previously using silvadene at Month 1 visit, but was to discontinue use at that time. Inquired if patient is currently using any products, and the name of the product she is using, if any.

## 2012-01-25 ENCOUNTER — Ambulatory Visit
Admission: RE | Admit: 2012-01-25 | Discharge: 2012-01-25 | Disposition: A | Discharge status: Home / Self Care | Payer: Medicare Other | Source: Ambulatory Visit | Attending: Neurosurgery | Admitting: Neurosurgery

## 2012-01-25 DIAGNOSIS — D497 Neoplasm of unspecified behavior of endocrine glands and other parts of nervous system: Secondary | ICD-10-CM

## 2012-01-25 MED ORDER — GADOBENATE DIMEGLUMINE 529 MG/ML IV SOLN
10.0000 mL | Freq: Once | INTRAVENOUS | Status: AC | PRN
  Administered 2012-01-25: 10 mL via INTRAVENOUS

## 2012-02-09 ENCOUNTER — Encounter: Payer: Self-pay | Admitting: Internal Medicine

## 2012-03-15 ENCOUNTER — Telehealth: Payer: Self-pay | Admitting: *Deleted

## 2012-03-15 NOTE — Telephone Encounter (Signed)
moved patient appointment to 04-15-2012 due to the md being on vac 

## 2012-03-22 ENCOUNTER — Encounter: Payer: Self-pay | Admitting: Internal Medicine

## 2012-04-15 ENCOUNTER — Ambulatory Visit (HOSPITAL_BASED_OUTPATIENT_CLINIC_OR_DEPARTMENT_OTHER): Payer: Medicare Other | Admitting: Oncology

## 2012-04-15 ENCOUNTER — Other Ambulatory Visit (HOSPITAL_BASED_OUTPATIENT_CLINIC_OR_DEPARTMENT_OTHER): Payer: Medicare Other | Admitting: Lab

## 2012-04-15 VITALS — BP 112/62 | HR 73 | Temp 98.4°F | Resp 20 | Ht 66.0 in | Wt 294.7 lb

## 2012-04-15 DIAGNOSIS — Z170 Estrogen receptor positive status [ER+]: Secondary | ICD-10-CM

## 2012-04-15 DIAGNOSIS — C50912 Malignant neoplasm of unspecified site of left female breast: Secondary | ICD-10-CM

## 2012-04-15 DIAGNOSIS — C50919 Malignant neoplasm of unspecified site of unspecified female breast: Secondary | ICD-10-CM

## 2012-04-15 DIAGNOSIS — E559 Vitamin D deficiency, unspecified: Secondary | ICD-10-CM

## 2012-04-15 DIAGNOSIS — C50419 Malignant neoplasm of upper-outer quadrant of unspecified female breast: Secondary | ICD-10-CM

## 2012-04-15 LAB — COMPREHENSIVE METABOLIC PANEL (CC13)
ALT: 18 U/L (ref 0–55)
Albumin: 3.6 g/dL (ref 3.5–5.0)
CO2: 30 mEq/L — ABNORMAL HIGH (ref 22–29)
Calcium: 10.7 mg/dL — ABNORMAL HIGH (ref 8.4–10.4)
Chloride: 103 mEq/L (ref 98–107)
Sodium: 140 mEq/L (ref 136–145)
Total Protein: 7.1 g/dL (ref 6.4–8.3)

## 2012-04-15 LAB — CBC WITH DIFFERENTIAL/PLATELET
BASO%: 0.6 % (ref 0.0–2.0)
HCT: 34.4 % — ABNORMAL LOW (ref 34.8–46.6)
MCHC: 32.1 g/dL (ref 31.5–36.0)
MONO#: 0.5 10*3/uL (ref 0.1–0.9)
NEUT%: 63 % (ref 38.4–76.8)
RBC: 4.2 10*6/uL (ref 3.70–5.45)
WBC: 6.5 10*3/uL (ref 3.9–10.3)
lymph#: 1.8 10*3/uL (ref 0.9–3.3)

## 2012-04-15 MED ORDER — LETROZOLE 2.5 MG PO TABS: 2.5000 mg | ORAL_TABLET | Freq: Every day | ORAL | Status: DC

## 2012-04-15 NOTE — Patient Instructions (Addendum)
senekot- s for constipation, take 1-2 per day  We will switch to letrozole as the breast cancer prevention pill.

## 2012-04-15 NOTE — Progress Notes (Signed)
Hematology and Oncology Follow Up Visit  [name] [id number] 1946-05-06 66 y.o. 04/15/2012    HPI: [name] is a 66 year old [place] woman with a newly diagnosed T1c, N0, ER/PR positive (100/100%), Ki-67 of 6%, HER-2 negative left breast carcinoma for which she underwent a left lumpectomy with sentinel node dissection revealing a 1.3 cm infiltrating ductal carcinoma and 3 clear sentinel nodes. She has been on Arimidex since completion of radiation in approximately May of this year.  Interim History:   Patient returns for followup. She completed radiation therapy on 11/24/2011. She knows that she's been having some jaw pain constipation and occasional night sweats. She stopped taking the Arimidex last week and feels a little bit better. Review of Systems: Constitutional:  no weight loss, fever, night sweats and feels well Eyes: uses glasses ENT: no complaints Cardiovascular: no chest pain or dyspnea on exertion Respiratory: no cough, shortness of breath, or wheezing Neurological: no TIA or stroke symptoms Dermatological: negative Gastrointestinal: no abdominal pain, change in bowel habits, or black or bloody stools Genito-Urinary: no dysuria, trouble voiding, or hematuria Hematological and Lymphatic: negative Breast: positive for - pain at L lumpectomy site. Musculoskeletal: negative Remaining ROS negative.  Medications:   I have reviewed the patient's current medications.  Current Outpatient Prescriptions  Medication Sig Dispense Refill  . albuterol (PROVENTIL HFA;VENTOLIN HFA) 108 (90 BASE) MCG/ACT inhaler Inhale 1-2 puffs into the lungs every 6 (six) hours as needed for wheezing.  1 Inhaler  0  . anastrozole (ARIMIDEX) 1 MG tablet Take 1 mg by mouth daily.      . Cholecalciferol (VITAMIN D PO) Take by mouth daily.      . clopidogrel (PLAVIX) 75 MG tablet Take 75 mg by mouth daily.        . fish oil-omega-3 fatty acids 1000 MG capsule Take 1 capsule  by mouth 2 (two) times daily.        [name] gabapentin (NEURONTIN) 300 MG capsule Take 300 mg by mouth 3 (three) times daily.      [name] HYDROcodone-acetaminophen (NORCO) 10-325 MG per tablet Take 1 tablet by mouth every 8 (eight) hours as needed. Pain.      [name] losartan-hydrochlorothiazide (HYZAAR) 100-25 MG per tablet Take 1 tablet by mouth daily.        . niacin (NIASPAN) 500 MG CR tablet Take 500 mg by mouth 2 (two) times daily.       . simvastatin (ZOCOR) 40 MG tablet Take 40 mg by mouth at bedtime.        . verapamil (VERELAN PM) 360 MG 24 hr capsule Take 360 mg by mouth daily.         No current facility-administered medications for this visit.   Facility-Administered Medications Ordered in Other Visits  Medication Dose Route Frequency Provider Last Rate Last Dose  . silver sulfADIAZINE (SILVADENE) 1 % cream   Topical BID Billie Lade, MD   1 application at 11/14/11 4098    Allergies:  Allergies  Allergen Reactions  . Lodine (Etodolac) Itching    Physical Exam: Filed Vitals:   04/15/12 1445  BP: 112/62  Pulse: 73  Temp: 98.4 F (36.9 C)  Resp: 20    Body mass index is 47.57 kg/(m^2). Weight: 295 lbs. HEENT:  Sclerae anicteric, conjunctivae pink.  Oropharynx clear.  No mucositis or candidiasis.   Nodes:  No cervical, supraclavicular, or axillary lymphadenopathy palpated.  Breast Exam:  The left breast was examined, the right and left breasts  are free of any obvious masses. The left breast is status post lumpectomy and radiation. There are pigmentation changes throughout the left breast but no obvious evidence of skin breakdown. The axilla bilaterally are negative the Lungs:  Clear to auscultation bilaterally.  No crackles, rhonchi, or wheezes.   Heart:  Regular rate and rhythm.   Abdomen:  Soft, nontender.  Positive bowel sounds.  No organomegaly or masses palpated.   Musculoskeletal:  No focal spinal tenderness to palpation.  Extremities:  Benign.  No peripheral edema or cyanosis.    Skin:  Benign.   Neuro:  Nonfocal.   Lab Results: Lab Results  Component Value Date   WBC 6.5 04/15/2012   HGB 11.0* 04/15/2012   HCT 34.4* 04/15/2012   MCV 81.9 04/15/2012   PLT 240 04/15/2012   NEUTROABS 4.1 04/15/2012     Chemistry      Component Value Date/Time   NA 142 01/17/2012 1431   K 3.8 01/17/2012 1431   CL 104 01/17/2012 1431   CO2 28 01/17/2012 1431   BUN 21 01/17/2012 1431   CREATININE 1.36* 01/17/2012 1431      Component Value Date/Time   CALCIUM 10.1 01/17/2012 1431   ALKPHOS 63 01/17/2012 1431   AST 11 01/17/2012 1431   ALT 19 01/17/2012 1431   BILITOT 0.3 01/17/2012 1431      Lab Results  Component Value Date   LABCA2 12 01/17/2012    Assessment:  [name] is a 66 year old [place] woman with a newly diagnosed T1c, N0, ER/PR positive (100/100%), Ki-67 of 6%, HER-2 negative left breast carcinoma for which she underwent a left lumpectomy with sentinel node dissection revealing a 1.3 cm infiltrating ductal carcinoma and 3 clear sentinel nodes.  2. Oncotype DX score of 3. Status post completion of radiation  .   Plan:   This visit is no obvious evidence of recurrent breast cancer. She may have experienced side effects of Arimidex. I recommended she switched to letrozole I discussed with him the side effects may be a drug specific dependent and has a class effect of this certainly may be the case. I will see her in 6 months time. She will contact us if she cannot tolerate this medication. I scheduled her for followup mammogram as well. Jerrye Seebeck ,04/15/2012

## 2012-04-16 ENCOUNTER — Telehealth: Payer: Self-pay | Admitting: *Deleted

## 2012-04-16 LAB — VITAMIN D 25 HYDROXY (VIT D DEFICIENCY, FRACTURES): Vit D, 25-Hydroxy: 44 ng/mL (ref 30–89)

## 2012-04-16 NOTE — Telephone Encounter (Signed)
Pt scheduled for follow up colon 05/03/12 with previsit scheduled 04/17/12. Pt with BMI 48 and severe OSA. Spoke with Dr. Juanda Chance about proceeding with colon here or need for scheduling at hospital. After reviewing chart Dr. Juanda Chance felt it would be best if patient would come in for office visit. Last colon9/2006 showed diverticulosis only, prior colon 1997 showed adenoma. Dr. Juanda Chance would like to assess patient, need and appropriate timing for colonoscopy and best location to have that procedure completed if needed. Phone call to explain this to patient with complete understanding and agreement. She did not have her schedule with her at this time and states that she will call back in to schedule an appointment with Dr. Juanda Chance in the office. She understands fully that I am cancelling her previst and colonoscopy that have been scheduled at this time./TE

## 2012-04-19 ENCOUNTER — Other Ambulatory Visit: Payer: Medicare Other | Admitting: Lab

## 2012-04-19 ENCOUNTER — Ambulatory Visit: Payer: Medicare Other | Admitting: Oncology

## 2012-05-03 ENCOUNTER — Encounter: Payer: Medicare Other | Admitting: Internal Medicine

## 2012-05-23 ENCOUNTER — Encounter: Payer: Self-pay | Admitting: *Deleted

## 2012-05-27 ENCOUNTER — Encounter: Payer: Self-pay | Admitting: *Deleted

## 2012-05-27 ENCOUNTER — Ambulatory Visit
Admission: RE | Admit: 2012-05-27 | Discharge: 2012-05-27 | Disposition: A | Discharge status: Home / Self Care | Payer: Medicare Other | Source: Ambulatory Visit | Attending: Radiation Oncology | Admitting: Radiation Oncology

## 2012-05-27 ENCOUNTER — Encounter: Payer: Self-pay | Admitting: Radiation Oncology

## 2012-05-27 VITALS — BP 161/77 | HR 74 | Temp 98.1°F | Wt 292.5 lb

## 2012-05-27 DIAGNOSIS — C50912 Malignant neoplasm of unspecified site of left female breast: Secondary | ICD-10-CM

## 2012-05-27 HISTORY — DX: Reserved for concepts with insufficient information to code with codable children: IMO0002

## 2012-05-27 HISTORY — DX: Reserved for inherently not codable concepts without codable children: IMO0001

## 2012-05-27 NOTE — Progress Notes (Signed)
Radiation Oncology         [phone] ________________________________  Name: [name] MRN: [id number]  Date: 05/27/2012  DOB: 14-Sep-1945  Follow-Up Visit Note  CC: [name], MD  [name], MD  Diagnosis:   Stage I left breast cancer  Interval Since Last Radiation:  6 months  Narrative:  The patient returns today for routine follow-up.  Arimidex discontinued and now started on letrozole 04/15/12.Denies any problems with breast. She denies any nipple discharge or bleeding. Status post transsphenoidal resection of a pituitary macroadenoma.  **                              ALLERGIES:  is allergic to cymbalta and lodine.  Meds: Current Outpatient Prescriptions  Medication Sig Dispense Refill  . Cholecalciferol (VITAMIN D PO) Take by mouth daily.      . clopidogrel (PLAVIX) 75 MG tablet Take 75 mg by mouth daily.        . cyclobenzaprine (FLEXERIL) 5 MG tablet Take 5 mg by mouth at bedtime as needed. 2 tabs at bedtime      . fish oil-omega-3 fatty acids 1000 MG capsule Take 1 capsule by mouth 2 (two) times daily.        [name] gabapentin (NEURONTIN) 300 MG capsule Take 300 mg by mouth 3 (three) times daily.      [name] HYDROcodone-acetaminophen (NORCO) 10-325 MG per tablet Take 1 tablet by mouth every 8 (eight) hours as needed. Pain.      [name] letrozole (FEMARA) 2.5 MG tablet Take 1 tablet (2.5 mg total) by mouth daily.  30 tablet  3  . losartan-hydrochlorothiazide (HYZAAR) 100-25 MG per tablet Take 1 tablet by mouth daily.        . niacin (NIASPAN) 500 MG CR tablet Take 500 mg by mouth 2 (two) times daily.       . simvastatin (ZOCOR) 40 MG tablet Take 40 mg by mouth at bedtime.        . verapamil (VERELAN PM) 360 MG 24 hr capsule Take 360 mg by mouth daily.         No current facility-administered medications for this encounter.   Facility-Administered Medications Ordered in Other Encounters  Medication Dose Route Frequency Provider Last Rate Last Dose  . silver  sulfADIAZINE (SILVADENE) 1 % cream   Topical BID Billie Lade, MD   1 application at 11/14/11 2841    Physical Findings: The patient is in no acute distress. Patient is alert and oriented.  weight is 292 lb 8 oz (132.677 kg). Her temperature is 98.1 F (36.7 C). Her blood pressure is 161/77 and her pulse is 74. [name]  No palpable cervical supraclavicular or axillary adenopathy.  She has significant carotid bruit on the right side which was worked up earlier this year.  The lungs are clear. The heart has regular rhythm and rate. examination of the right breast reveals that the large and pendulous without mass or nipple discharge. Examination left breast reveals some hyperpigmentation changes throughout the breast.   there is some induration at the site of lumpectomy scar but no dominant masses appreciated,  breast nipple discharge or bleeding.  Lab Findings: Lab Results  Component Value Date   WBC 6.5 04/15/2012   HGB 11.0* 04/15/2012   HCT 34.4* 04/15/2012   MCV 81.9 04/15/2012   PLT 240 04/15/2012    @LASTCHEM @  Radiographic Findings: No results found.  Impression:  The patient is recovering from the effects of radiation.  No signs of recurrence on clinical exam today  Plan:  Routine followup in 6 months per protocol guidelines.  _____________________________________   Billie Lade, PhD, MD

## 2012-05-27 NOTE — Progress Notes (Signed)
05/27/12 at 2:27pm - [id number] - 6 months post RT visit.  The pt was into the cancer center this afternoon for her 6 months post RT visit.  The pt completed her required questionnaires.  The pt has not had a recurrence of her breast cancer.  Her next mammogram is scheduled for December 2013.  She states she is not currently using any skin products on her treated breast.  She confirmed that she is taking letrozole daily.  She states she was originally on Arimidex (started in June per pt report), and then she was switched to letrozole on 04/15/12.  She states she is tolerating the letrozole better.  The pt was seen and examined today by Dr. Roselind Messier.  He completed the RTOG SOMA form during his examination.  The pt was given her 12 months post RT visit on 11/18/12.  The pt was thanked for her continued support of this study.

## 2012-05-27 NOTE — Progress Notes (Signed)
[facility] Psychosocial Distress Screening Clinical Social Work  Clinical Social Work was referred by distress screening protocol.  The patient scored a 7 on the Psychosocial Distress Thermometer which indicates moderate distress. Clinical Social Worker met with patient in the exam room to assess for distress and other psychosocial needs.  Pt expressed feelings of loneliness, sadness, and depression.  Pt stated she had felt this way for over a year.  Pt has taken medication and attempted counseling in the past, but did not feel it was successful.  CSW and pt discussed things that brought her joy.  Pt identified Church, Sunday school, and her part time job.  CSw and pt discussed the support services at [facility] and pt expressed interest in the breat cancer support group.  Pt stated she planned to contact her PCP to discuss the possibility of an antidepressant, and plans to attend the November breat cancer support group.  CSW encouraged pt to call with any questions or concerns.           Tamala Julian, MSW, LCSW Clinical Social Worker [facility] [phone]

## 2012-05-27 NOTE — Progress Notes (Signed)
Patient here for routine follow up completion of left breast radiation treatment on 11/24/11 .Arimidex discontinued and now started on letrozole 04/15/12.Denies any problems with breast.Filling out survey per Research nurse Genella Rife RN.Status post transsphenoidal resection of a pituitary macroadenoma

## 2012-06-03 ENCOUNTER — Ambulatory Visit (INDEPENDENT_AMBULATORY_CARE_PROVIDER_SITE_OTHER): Payer: Medicare Other | Admitting: Surgery

## 2012-06-03 ENCOUNTER — Encounter (INDEPENDENT_AMBULATORY_CARE_PROVIDER_SITE_OTHER): Payer: Self-pay | Admitting: Surgery

## 2012-06-03 VITALS — BP 148/88 | HR 70 | Temp 97.7°F | Resp 18 | Ht 66.0 in | Wt 290.6 lb

## 2012-06-03 DIAGNOSIS — Z853 Personal history of malignant neoplasm of breast: Secondary | ICD-10-CM

## 2012-06-03 NOTE — Progress Notes (Signed)
Subjective:     Patient ID: [name], female   DOB: Jan 25, 1946, 66 y.o.   MRN: [id number]  HPI She is here for her long-term followup of her left breast cancer. The left lumpectomy was in January. Carcinomas were negative. She finished her radiation in May. She has no complaints. She will be having a mammogram in December  Review of Systems     Objective:   Physical Exam On exam, there is some fullness medially on her incision in the upper outer quadrant breast. There is no axillary adenopathy    Assessment:     Long-term followup left breast cancer    Plan:     I want to see her back to examine this area again in 3 months. I will see her back sooner if the mammogram shows an abnormality

## 2012-06-14 ENCOUNTER — Encounter: Payer: Self-pay | Admitting: Internal Medicine

## 2012-06-14 ENCOUNTER — Ambulatory Visit (INDEPENDENT_AMBULATORY_CARE_PROVIDER_SITE_OTHER): Payer: Medicare Other | Admitting: Internal Medicine

## 2012-06-14 VITALS — BP 124/86 | HR 88 | Ht 65.5 in | Wt 292.0 lb

## 2012-06-14 DIAGNOSIS — Z860100 Personal history of colon polyps, unspecified: Secondary | ICD-10-CM

## 2012-06-14 DIAGNOSIS — Z1213 Encounter for screening for malignant neoplasm of small intestine: Secondary | ICD-10-CM

## 2012-06-14 DIAGNOSIS — D689 Coagulation defect, unspecified: Secondary | ICD-10-CM

## 2012-06-14 DIAGNOSIS — Z1211 Encounter for screening for malignant neoplasm of colon: Secondary | ICD-10-CM

## 2012-06-14 NOTE — Patient Instructions (Addendum)
CC: [name], MD

## 2012-06-14 NOTE — Progress Notes (Signed)
[name] 05-16-1946 MRN [id number]  History of Present Illness:  This is a 66 year old, African American [name] who is here to discuss having a colonoscopy. She is on Plavix and she has obstructive sleep apnea. Her last colonoscopy in September 2006 was normal. Her first colonoscopy in 1997 showed a tubular adenoma. A colonoscopy in 2001 was also normal. She has a history of breast cancer and is status post radiation and lumpectomy. She has no GI symptoms other than occasional constipation. She denies rectal bleeding or family history of colon cancer.    Past Medical History  Diagnosis Date  . OSA (obstructive sleep apnea)   . Hypertension   . Hyperlipidemia   . Headache   . Blood transfusion   . Shortness of breath   . Pneumonia 4/12  . Breast cancer     left breast  . Anemia   . Anxiety   . Depression     Dr.Lewit  . Diverticulosis   . Radiation 10/29/11-11/24/11    left breast 6100 cGy  . Tinnitus   . History of colon polyps 1998    adenomatous  . Spinal stenosis    Past Surgical History  Procedure Date  . Total knee arthroplasty 2006    bilateral  . Polypectomy 1990's  . Appendectomy 1978  . Thyroid cyst excision 1994  . Transphenoidal / transnasal hypophysectomy / resection pituitary tumor 6/11  . Belpharoptosis repair   . Breast lumpectomy 08/2011    left    reports that she has never smoked. She has never used smokeless tobacco. She reports that she does not drink alcohol or use illicit drugs. family history includes Clotting disorder in her father; Diabetes in her mother; Heart disease in her father; and Stroke in her father and mother.  There is no history of Colon cancer. Allergies  Allergen Reactions  . Cymbalta (Duloxetine Hcl)     Stomach pain  . Maxzide (Hydrochlorothiazide W-Triamterene) Other (See Comments)    Cramping   . Pravastatin Sodium Other (See Comments)    Headache   . Lodine (Etodolac) Itching        Review of Systems:  Negative for dysphagia heartburn abdominal pain  The remainder of the 10 point ROS is negative except as outlined in H&P   Assessment and Plan:  Problem #58 [name] who had a colonoscopy in September 2006 which was a normal exam. She had tubular adenoma in 1997 and she had 2 subsequent normal colonoscopies. According to the guidelines, she should be due for a recall colonoscopy in September 2016. Given her risk factors such as being on Plavix, I think it is reasonable to wait another 3 years to her due date in September 2016. We have discussed postponing the colonoscopy and she agrees with the plan.   [name]

## 2012-06-24 ENCOUNTER — Ambulatory Visit: Payer: Medicare Other | Admitting: Radiation Oncology

## 2012-07-15 ENCOUNTER — Ambulatory Visit
Admission: RE | Admit: 2012-07-15 | Discharge: 2012-07-15 | Disposition: A | Discharge status: Home / Self Care | Payer: Medicare Other | Source: Ambulatory Visit | Attending: Oncology | Admitting: Oncology

## 2012-07-15 ENCOUNTER — Other Ambulatory Visit: Payer: Self-pay | Admitting: Oncology

## 2012-07-15 DIAGNOSIS — C50912 Malignant neoplasm of unspecified site of left female breast: Secondary | ICD-10-CM

## 2012-07-17 ENCOUNTER — Other Ambulatory Visit (HOSPITAL_BASED_OUTPATIENT_CLINIC_OR_DEPARTMENT_OTHER): Payer: Medicare Other | Admitting: Lab

## 2012-07-17 ENCOUNTER — Ambulatory Visit (HOSPITAL_BASED_OUTPATIENT_CLINIC_OR_DEPARTMENT_OTHER): Payer: Medicare Other | Admitting: Oncology

## 2012-07-17 VITALS — BP 147/60 | HR 73 | Temp 98.3°F | Resp 20 | Ht 65.5 in | Wt 292.9 lb

## 2012-07-17 DIAGNOSIS — C50419 Malignant neoplasm of upper-outer quadrant of unspecified female breast: Secondary | ICD-10-CM

## 2012-07-17 DIAGNOSIS — R6884 Jaw pain: Secondary | ICD-10-CM

## 2012-07-17 DIAGNOSIS — C50912 Malignant neoplasm of unspecified site of left female breast: Secondary | ICD-10-CM

## 2012-07-17 DIAGNOSIS — Z170 Estrogen receptor positive status [ER+]: Secondary | ICD-10-CM

## 2012-07-17 LAB — CBC WITH DIFFERENTIAL/PLATELET
BASO%: 0.5 % (ref 0.0–2.0)
Basophils Absolute: 0 10*3/uL (ref 0.0–0.1)
EOS%: 1.5 % (ref 0.0–7.0)
Eosinophils Absolute: 0.1 10*3/uL (ref 0.0–0.5)
HCT: 35.4 % (ref 34.8–46.6)
HGB: 11.6 g/dL (ref 11.6–15.9)
LYMPH%: 23.8 % (ref 14.0–49.7)
MCH: 26.7 pg (ref 25.1–34.0)
MCHC: 32.8 g/dL (ref 31.5–36.0)
MCV: 81.6 fL (ref 79.5–101.0)
MONO#: 0.3 10*3/uL (ref 0.1–0.9)
MONO%: 4.1 % (ref 0.0–14.0)
NEUT#: 5.6 10*3/uL (ref 1.5–6.5)
NEUT%: 70.1 % (ref 38.4–76.8)
Platelets: 275 10*3/uL (ref 145–400)
RBC: 4.34 10*6/uL (ref 3.70–5.45)
RDW: 16.1 % — ABNORMAL HIGH (ref 11.2–14.5)
WBC: 7.9 10*3/uL (ref 3.9–10.3)
lymph#: 1.9 10*3/uL (ref 0.9–3.3)

## 2012-07-17 LAB — COMPREHENSIVE METABOLIC PANEL (CC13)
ALT: 18 U/L (ref 0–55)
CO2: 28 mEq/L (ref 22–29)
Calcium: 10.7 mg/dL — ABNORMAL HIGH (ref 8.4–10.4)
Chloride: 102 mEq/L (ref 98–107)
Sodium: 147 mEq/L — ABNORMAL HIGH (ref 136–145)
Total Bilirubin: 0.33 mg/dL (ref 0.20–1.20)
Total Protein: 7.5 g/dL (ref 6.4–8.3)

## 2012-07-17 NOTE — Progress Notes (Signed)
Hematology and Oncology Follow Up Visit  [name] [id number] 04-30-1946 66 y.o. 07/17/2012    HPI: [name] is a 66 year old [place] woman with a newly diagnosed T1c, N0, ER/PR positive (100/100%), Ki-67 of 6%, HER-2 negative left breast carcinoma for which she underwent a left lumpectomy with sentinel node dissection revealing a 1.3 cm infiltrating ductal carcinoma and 3 clear sentinel nodes. She has been on Arimidex since completion of radiation in approximately May of this year and subsequently was changed to letrozole.   Interim History:   Patient returns for followup. She completed radiation therapy on 11/24/2011. She's been having ongoing problems with left jaw pain. She has been worked up extensively for TMJ and apparently this is not what is going on. She also has constipation requiring ongoing medication. She believes that is the temporal relationship between taking the AI initiation of this pain. Of note is that she had a recent mammogram in December of this year which showed some calcifications in the other breast and this is now up will be evaluated by biopsy on December 26.  Review of Systems: Constitutional:  no weight loss, fever, night sweats and feels well Eyes: uses glasses ENT: no complaints, Left-sided jaw pain Cardiovascular: no chest pain or dyspnea on exertion Respiratory: no cough, shortness of breath, or wheezing Neurological: no TIA or stroke symptoms Dermatological: negative Gastrointestinal: no abdominal pain, change in bowel habits, or black or bloody stools Genito-Urinary: no dysuria, trouble voiding, or hematuria Hematological and Lymphatic: negative Breast: positive for - pain at L lumpectomy site. Musculoskeletal: negative Remaining ROS negative.  Medications:   I have reviewed the patient's current medications.  Current Outpatient Prescriptions  Medication Sig Dispense Refill  . anastrozole (ARIMIDEX) 1 MG tablet        . Cholecalciferol (VITAMIN D PO) Take by mouth daily.      . clopidogrel (PLAVIX) 75 MG tablet Take 75 mg by mouth daily.        . cyclobenzaprine (FLEXERIL) 5 MG tablet Take 5 mg by mouth at bedtime as needed. 2 tabs at bedtime      . fish oil-omega-3 fatty acids 1000 MG capsule Take 1 capsule by mouth 2 (two) times daily.        [name] gabapentin (NEURONTIN) 300 MG capsule Take 300 mg by mouth 3 (three) times daily.      [name] HYDROcodone-acetaminophen (NORCO) 10-325 MG per tablet Take 1 tablet by mouth every 8 (eight) hours as needed. Pain.      [name] letrozole (FEMARA) 2.5 MG tablet Take 1 tablet (2.5 mg total) by mouth daily.  30 tablet  3  . losartan-hydrochlorothiazide (HYZAAR) 100-25 MG per tablet Take 1 tablet by mouth daily.        . methylPREDNIsolone (MEDROL DOSPACK) 4 MG tablet       . niacin (NIASPAN) 500 MG CR tablet Take 500 mg by mouth 2 (two) times daily.       . simvastatin (ZOCOR) 40 MG tablet Take 40 mg by mouth at bedtime.        . verapamil (VERELAN PM) 360 MG 24 hr capsule Take 360 mg by mouth daily.         No current facility-administered medications for this visit.   Facility-Administered Medications Ordered in Other Visits  Medication Dose Route Frequency Provider Last Rate Last Dose  . silver sulfADIAZINE (SILVADENE) 1 % cream   Topical BID Billie Lade, MD   1 application at 11/14/11 [phone]  Allergies:  Allergies  Allergen Reactions  . Cymbalta (Duloxetine Hcl)     Stomach pain  . Maxzide (Hydrochlorothiazide W-Triamterene) Other (See Comments)    Cramping   . Pravastatin Sodium Other (See Comments)    Headache   . Lodine (Etodolac) Itching    Physical Exam: Filed Vitals:   07/17/12 1638  BP: 147/60  Pulse: 73  Temp: 98.3 F (36.8 C)  Resp: 20    Body mass index is 48.00 kg/(m^2). Weight: 295 lbs. HEENT:  Sclerae anicteric, conjunctivae pink.  Oropharynx clear.  No mucositis or candidiasis.   Nodes:  No cervical, supraclavicular, or axillary  lymphadenopathy palpated.  Breast Exam:  The left breast was examined, the right and left breasts are free of any obvious masses. The left breast is status post lumpectomy and radiation. There are pigmentation changes throughout the left breast but no obvious evidence of skin breakdown. The axilla bilaterally are negative , May be a small seroma in the incision line in the left breast there Lungs:  Clear to auscultation bilaterally.  No crackles, rhonchi, or wheezes.   Heart:  Regular rate and rhythm.   Abdomen:  Soft, nontender.  Positive bowel sounds.  No organomegaly or masses palpated.   Musculoskeletal:  No focal spinal tenderness to palpation.  Extremities:  Benign.  No peripheral edema or cyanosis.   Skin:  Benign.   Neuro:  Nonfocal.   Lab Results: Lab Results  Component Value Date   WBC 7.9 07/17/2012   HGB 11.6 07/17/2012   HCT 35.4 07/17/2012   MCV 81.6 07/17/2012   PLT 275 07/17/2012   NEUTROABS 5.6 07/17/2012     Chemistry      Component Value Date/Time   NA 147* 07/17/2012 1521   NA 142 01/17/2012 1431   K 3.5 07/17/2012 1521   K 3.8 01/17/2012 1431   CL 102 07/17/2012 1521   CL 104 01/17/2012 1431   CO2 28 07/17/2012 1521   CO2 28 01/17/2012 1431   BUN 17.0 07/17/2012 1521   BUN 21 01/17/2012 1431   CREATININE 1.2* 07/17/2012 1521   CREATININE 1.36* 01/17/2012 1431      Component Value Date/Time   CALCIUM 10.7* 07/17/2012 1521   CALCIUM 10.1 01/17/2012 1431   ALKPHOS 80 07/17/2012 1521   ALKPHOS 63 01/17/2012 1431   AST 12 07/17/2012 1521   AST 11 01/17/2012 1431   ALT 18 07/17/2012 1521   ALT 19 01/17/2012 1431   BILITOT 0.33 07/17/2012 1521   BILITOT 0.3 01/17/2012 1431      Lab Results  Component Value Date   LABCA2 12 01/17/2012    Assessment:  [name] is a 66 year old [place] woman with a newly diagnosed T1c, N0, ER/PR positive (100/100%), Ki-67 of 6%, HER-2 negative left breast carcinoma for which she underwent a left  lumpectomy with sentinel node dissection revealing a 1.3 cm infiltrating ductal carcinoma and 3 clear sentinel nodes.  2. Oncotype DX score of 3. Status post completion of radiation currently on Arimidex. .   Plan:  The patient is frustrated with her discomfort in her jaw. It clearly is in my mind that TMJ but she claims that side. I have suggested we can stop the AI for a month. This clear improvement in your switch her to tamoxifen. In addition she will biopsy of the right breast. She may have a digital problem in that area. We have scheduled her for followup in 2 months. She will call his report  how she is doing after a month off the AI.  Montravious Weigelt ,07/17/2012

## 2012-07-18 ENCOUNTER — Telehealth: Payer: Self-pay | Admitting: *Deleted

## 2012-07-18 NOTE — Telephone Encounter (Signed)
Gave patient instructions on getting the 2014 appointemnt

## 2012-07-25 ENCOUNTER — Ambulatory Visit
Admission: RE | Admit: 2012-07-25 | Discharge: 2012-07-25 | Disposition: A | Discharge status: Home / Self Care | Payer: Medicare Other | Source: Ambulatory Visit | Attending: Oncology | Admitting: Oncology

## 2012-07-25 DIAGNOSIS — C50912 Malignant neoplasm of unspecified site of left female breast: Secondary | ICD-10-CM

## 2012-08-14 ENCOUNTER — Telehealth: Payer: Self-pay | Admitting: Oncology

## 2012-08-14 NOTE — Telephone Encounter (Signed)
Pt called

## 2012-08-14 NOTE — Telephone Encounter (Signed)
Pt called asking to be scheduled for a follow up because Dr. Donnie Coffin held her AI and was to come back for a reassessement and plan.  She is scheduled to see Bobbe Medico, NP on 08/27/12.  Pt verbalized understanding.

## 2012-08-27 ENCOUNTER — Emergency Department (HOSPITAL_COMMUNITY)
Admission: EM | Admit: 2012-08-27 | Discharge: 2012-08-27 | Disposition: A | Discharge status: Home / Self Care | Payer: Medicare Other | Attending: Emergency Medicine | Admitting: Emergency Medicine

## 2012-08-27 ENCOUNTER — Emergency Department (HOSPITAL_COMMUNITY): Payer: Medicare Other

## 2012-08-27 ENCOUNTER — Ambulatory Visit (HOSPITAL_BASED_OUTPATIENT_CLINIC_OR_DEPARTMENT_OTHER): Payer: Medicare Other | Admitting: Nurse Practitioner

## 2012-08-27 VITALS — BP 150/74 | HR 94 | Temp 98.5°F | Resp 20 | Ht 65.5 in | Wt 295.2 lb

## 2012-08-27 DIAGNOSIS — Z862 Personal history of diseases of the blood and blood-forming organs and certain disorders involving the immune mechanism: Secondary | ICD-10-CM | POA: Insufficient documentation

## 2012-08-27 DIAGNOSIS — Z7902 Long term (current) use of antithrombotics/antiplatelets: Secondary | ICD-10-CM | POA: Insufficient documentation

## 2012-08-27 DIAGNOSIS — Z79899 Other long term (current) drug therapy: Secondary | ICD-10-CM | POA: Insufficient documentation

## 2012-08-27 DIAGNOSIS — Z853 Personal history of malignant neoplasm of breast: Secondary | ICD-10-CM | POA: Insufficient documentation

## 2012-08-27 DIAGNOSIS — E785 Hyperlipidemia, unspecified: Secondary | ICD-10-CM | POA: Insufficient documentation

## 2012-08-27 DIAGNOSIS — Z860100 Personal history of colon polyps, unspecified: Secondary | ICD-10-CM | POA: Insufficient documentation

## 2012-08-27 DIAGNOSIS — Z8719 Personal history of other diseases of the digestive system: Secondary | ICD-10-CM | POA: Insufficient documentation

## 2012-08-27 DIAGNOSIS — C50419 Malignant neoplasm of upper-outer quadrant of unspecified female breast: Secondary | ICD-10-CM

## 2012-08-27 DIAGNOSIS — Z96659 Presence of unspecified artificial knee joint: Secondary | ICD-10-CM | POA: Insufficient documentation

## 2012-08-27 DIAGNOSIS — Z8701 Personal history of pneumonia (recurrent): Secondary | ICD-10-CM | POA: Insufficient documentation

## 2012-08-27 DIAGNOSIS — M79609 Pain in unspecified limb: Secondary | ICD-10-CM

## 2012-08-27 DIAGNOSIS — Z923 Personal history of irradiation: Secondary | ICD-10-CM | POA: Insufficient documentation

## 2012-08-27 DIAGNOSIS — M25559 Pain in unspecified hip: Secondary | ICD-10-CM | POA: Insufficient documentation

## 2012-08-27 DIAGNOSIS — C50912 Malignant neoplasm of unspecified site of left female breast: Secondary | ICD-10-CM

## 2012-08-27 DIAGNOSIS — Z8739 Personal history of other diseases of the musculoskeletal system and connective tissue: Secondary | ICD-10-CM | POA: Insufficient documentation

## 2012-08-27 DIAGNOSIS — Z8669 Personal history of other diseases of the nervous system and sense organs: Secondary | ICD-10-CM | POA: Insufficient documentation

## 2012-08-27 DIAGNOSIS — Z8659 Personal history of other mental and behavioral disorders: Secondary | ICD-10-CM | POA: Insufficient documentation

## 2012-08-27 DIAGNOSIS — I10 Essential (primary) hypertension: Secondary | ICD-10-CM | POA: Insufficient documentation

## 2012-08-27 NOTE — ED Notes (Signed)
Pt c/o L sided pain near top of L thigh. Pt states she went up the stairs yesterday and got a muscle spasm to her L leg. Pt arrives in wheelchair from [facility]. Pt states she took a Vicodin for pain this morning and it helped for a while.  Pt also reports that her doctor told her she needed a chest x-ray. Pt requesting chest x-ray.

## 2012-08-27 NOTE — Progress Notes (Signed)
Hematology and Oncology Follow Up Visit  [name] [id number] 11-26-1945 67 y.o. 08/27/2012    HPI: [name] is a 67 year old [place] woman with a newly diagnosed T1c, N0, ER/PR positive (100/100%), Ki-67 of 6%, HER-2 negative left breast carcinoma for which she underwent a left lumpectomy with sentinel node dissection revealing a 1.3 cm infiltrating ductal carcinoma and 3 clear sentinel nodes. She has been on Arimidex since completion of radiation in April 2013 but was subsequently was changed to letrozole.   Interim History:   Patient returns for followup. She completed radiation therapy on 11/24/2011. She was on Arimidex, then changed to letrozole. In December 2013 she saw Dr. Donnie Coffin and he discontinued all AI as she was having persistent jaw pain. Since that visit she has had both a dental examination and a consult with an oral surgeon. She does not have any underlying jaw or bone issues that they could find, though she was placed on a anti-inflammatory med and muscle relaxant. These have helped but she still complains of "clicking noises when she eats or moves jaw." However, her main complaint today is a pain in the femoral area of left leg which has hindered her ability to move. She was emphatic she has to "pick this leg up" to get in and out of the car, or go up steps. She was able to ambulate to clinic without assistance. There is no perceivable swelling, edema or warmth of this leg compared to her right leg.   Review of Systems: Constitutional:  no weight loss, fever, night sweats and feels well Eyes: uses glasses ENT: no complaints, reports ongoing 'clicking of jaw when she eats' Cardiovascular: states she often has left breast pain which she confuses with chest pain Respiratory: reports cough due to sinuses, and is worried she might have asymptomatic walking pneumonia, also uses CPAP Neurological: no TIA or stroke symptoms Dermatological:  negative Gastrointestinal: no abdominal pain, nausea, vomiting, diarrhea or indigestions, states "constipation is somewhat better off the medicine" Genito-Urinary: states her urine "smells foul, not like human urine should smell" Hematological and Lymphatic: negative Breast: positive for - pain at L lumpectomy site. Musculoskeletal: complaining of pain in left femoral site, and decreased strength "I have to pick this leg up to get in and out of car or go up stairs" Remaining ROS negative.  Medications:   I have reviewed the patient's current medications.  No current facility-administered medications for this visit.   Current Outpatient Prescriptions  Medication Sig Dispense Refill  . albuterol (PROVENTIL HFA;VENTOLIN HFA) 108 (90 BASE) MCG/ACT inhaler Inhale 2 puffs into the lungs every 6 (six) hours as needed. For shortness of breath.      [name] buPROPion (WELLBUTRIN XL) 150 MG 24 hr tablet Take 150 mg by mouth daily.      . cholecalciferol (VITAMIN D) 1000 UNITS tablet Take 1,000 Units by mouth daily.      . clopidogrel (PLAVIX) 75 MG tablet Take 75 mg by mouth daily.        . fish oil-omega-3 fatty acids 1000 MG capsule Take 1 capsule by mouth 2 (two) times daily.        [name] gabapentin (NEURONTIN) 300 MG capsule Take 300 mg by mouth 3 (three) times daily.      [name] HYDROcodone-acetaminophen (NORCO) 10-325 MG per tablet Take 1 tablet by mouth every 8 (eight) hours as needed. Pain.      [name] losartan-hydrochlorothiazide (HYZAAR) 100-25 MG per tablet Take 1 tablet by mouth daily.        [name]  niacin (NIASPAN) 500 MG CR tablet Take 500 mg by mouth 2 (two) times daily.       . simvastatin (ZOCOR) 40 MG tablet Take 40 mg by mouth at bedtime.        . verapamil (VERELAN PM) 360 MG 24 hr capsule Take 360 mg by mouth daily.         Facility-Administered Medications Ordered in Other Visits  Medication Dose Route Frequency Provider Last Rate Last Dose  . silver sulfADIAZINE (SILVADENE) 1 % cream   Topical BID  [name] Lade, MD   1 application at 11/14/11 9604    Allergies:  Allergies  Allergen Reactions  . Cymbalta (Duloxetine Hcl)     Stomach pain  . Maxzide (Hydrochlorothiazide W-Triamterene) Other (See Comments)    Cramping   . Pravastatin Sodium Other (See Comments)    Headache   . Lodine (Etodolac) Itching    Physical Exam: Filed Vitals:   08/27/12 1455  BP: 150/74  Pulse: 94  Temp: 98.5 F (36.9 C)  Resp: 20    Body mass index is 48.38 kg/(m^2). Weight: 295 lbs. HEENT:  Sclerae anicteric, conjunctivae pink.  Oropharynx clear.  No mucositis or candidiasis.   Nodes:  No cervical, supraclavicular, or axillary lymphadenopathy palpated.  Breast Exam:  The left breast was examined, the right and left breasts are free of any obvious masses. The left breast is status post lumpectomy and radiation. There are pigmentation changes throughout the left breast but no obvious evidence of skin breakdown. The axilla bilaterally are negative , No abnormalities noted.  Lungs:  Clear to auscultation bilaterally.  No crackles, rhonchi, or wheezes.   Heart:  Regular rate and rhythm.   Abdomen:  Soft, nontender. Obese  No organomegaly or masses palpated.   Musculoskeletal:  Patient has somewhat limited ROM in left leg but was able to walk to exam room, and get up on exam table unassisted.  Extremities:  Benign.  No peripheral edema or cyanosis.   Skin:  Benign.   Neuro:  Nonfocal.   Lab Results:  Lab Results  Component Value Date   WBC 7.9 07/17/2012   HGB 11.6 07/17/2012   HCT 35.4 07/17/2012   PLT 275 07/17/2012   GLUCOSE 171* 07/17/2012   CHOL  Value: 205        ATP III CLASSIFICATION:  <200     mg/dL   Desirable  540-981  mg/dL   Borderline High  >=191    mg/dL   High       * 4/78/2956   TRIG 83 11/11/2010   HDL 32* 11/11/2010   LDLCALC  Value: 156        Total Cholesterol/HDL:CHD Risk Coronary Heart Disease Risk Table                     Men   Women  1/2 Average Risk   3.4   3.3   Average Risk       5.0   4.4  2 X Average Risk   9.6   7.1  3 X Average Risk  23.4   11.0        Use the calculated Patient Ratio above and the CHD Risk Table to determine the patient's CHD Risk.        ATP III CLASSIFICATION (LDL):  <100     mg/dL   Optimal  213-086  mg/dL   Near or Above  Optimal  130-159  mg/dL   Borderline  865-784  mg/dL   High  >696     mg/dL   Very High* 2/95/2841   ALT 18 07/17/2012   AST 12 07/17/2012   NA 147* 07/17/2012   K 3.5 07/17/2012   CL 102 07/17/2012   CREATININE 1.2* 07/17/2012   BUN 17.0 07/17/2012   CO2 28 07/17/2012   HGBA1C  Value: 6.7 (NOTE)                                                                       According to the ADA Clinical Practice Recommendations for 2011, when HbA1c is used as a screening test:   >=6.5%   Diagnostic of Diabetes Mellitus           (if abnormal result  is confirmed)  5.7-6.4%   Increased risk of developing Diabetes Mellitus  References:Diagnosis and Classification of Diabetes Mellitus,Diabetes Care,2011,34(Suppl 1):S62-S69 and Standards of Medical Care in         Diabetes - 2011,Diabetes Care,2011,34  (Suppl 1):S11-S61.* 11/11/2010      Lab Results  Component Value Date   WBC 7.9 07/17/2012   HGB 11.6 07/17/2012   HCT 35.4 07/17/2012   MCV 81.6 07/17/2012   PLT 275 07/17/2012   NEUTROABS 5.6 07/17/2012     Chemistry      Component Value Date/Time   NA 147* 07/17/2012 1521   NA 142 01/17/2012 1431   K 3.5 07/17/2012 1521   K 3.8 01/17/2012 1431   CL 102 07/17/2012 1521   CL 104 01/17/2012 1431   CO2 28 07/17/2012 1521   CO2 28 01/17/2012 1431   BUN 17.0 07/17/2012 1521   BUN 21 01/17/2012 1431   CREATININE 1.2* 07/17/2012 1521   CREATININE 1.36* 01/17/2012 1431      Component Value Date/Time   CALCIUM 10.7* 07/17/2012 1521   CALCIUM 10.1 01/17/2012 1431   ALKPHOS 80 07/17/2012 1521   ALKPHOS 63 01/17/2012 1431   AST 12 07/17/2012 1521   AST 11 01/17/2012 1431   ALT 18 07/17/2012 1521    ALT 19 01/17/2012 1431   BILITOT 0.33 07/17/2012 1521   BILITOT 0.3 01/17/2012 1431      Lab Results  Component Value Date   LABCA2 12 01/17/2012    Assessment:  Ms. Mancebo is a 67 year old [place] woman with a history of  diagnosed T1c, N0, ER/PR positive (100/100%), Ki-67 of 6%, HER-2 negative left breast carcinoma for which she underwent a left lumpectomy with sentinel node dissection revealing a 1.3 cm infiltrating ductal carcinoma and 3 clear sentinel nodes. She completed Radiation spring of 2012.  2. Oncotype DX score of 3. Status post completion of radiation - was on Arimidex then switched to Femara due to side effects. 3. Femara held at visit in Nov 2013 to see if symptoms of jaw pain and constipation resolved . 4. Her most pressing worries at this time are her left femoral pain, her reported inability to "pick this leg up" , and concerns about having walking pneumonia without symptoms.  .   Plan: Dr. Darnelle Catalan saw this patient with me and agreed it was best if she was sent to the ER for further evaluation.  It is snowing and we are concerned, particularly given the patient's prior history of joint replacement and anticoagulation therapy that she could have an underlying DVT. The patient is also very worried she may have asymptomatic 'walking pneumonia' as well as a urinary tract infection. She will remain off of the AI or any hormonal therapy for another 6 weeks, and will follow up with Dr. Darnelle Catalan to discuss treatment options. The patient was in agreement with this plan. She was wheeled over to the ER.  Approximately 30 minutes was spent with patient, over 50% in counseling, and coordination of treatment.  [facility], AOCNP,NP-C  ,08/27/2012

## 2012-08-27 NOTE — ED Provider Notes (Signed)
History     CSN: [id number]  Arrival date & time 08/27/12  1537   First MD Initiated Contact with Patient 08/27/12 1651      Chief complaint: left hip pain  (Consider location/radiation/quality/duration/timing/severity/associated sxs/prior treatment) Patient is a 67 y.o. female presenting with hip pain. The history is provided by the patient.  Hip Pain Pertinent negatives include no chest pain, no abdominal pain, no headaches and no shortness of breath.  pt with hx breast ca, c/o getting a cramp, and sharp pain localized to left hip while going up steps yesterday. No specific injury or strain recalled. No fall. Since then, and only with certain movements hip or flexing hip, pt notes sharp transient pain in hip joint area.  No radiation of pain. No thigh or lower leg pain. No swelling to the leg. Pt denies cp or sob. ?hx djd, denies hx chronic hip pain. Hx spinal stenosis/chronic back pain - denies current back pain, denies any radicular pain. Pt denies cough or uri c/o. No fever or chills. No abd or flank pain. No dysuria or gu c/o.    Past Medical History  Diagnosis Date  . OSA (obstructive sleep apnea)   . Hypertension   . Hyperlipidemia   . Headache   . Blood transfusion   . Shortness of breath   . Pneumonia 4/12  . Breast cancer     left breast  . Anemia   . Anxiety   . Depression     Dr.Lewit  . Diverticulosis   . Radiation 10/29/11-11/24/11    left breast 6100 cGy  . Tinnitus   . History of colon polyps 1998    adenomatous  . Spinal stenosis     Past Surgical History  Procedure Date  . Total knee arthroplasty 2006    bilateral  . Polypectomy 1990's  . Appendectomy 1978  . Thyroid cyst excision 1994  . Transphenoidal / transnasal hypophysectomy / resection pituitary tumor 6/11  . Belpharoptosis repair   . Breast lumpectomy 08/2011    left    Family History  Problem Relation Age of Onset  . Heart disease Father   . Clotting disorder Father   . Stroke Father    . Stroke Mother   . Diabetes Mother   . Colon cancer Neg Hx     History  Substance Use Topics  . Smoking status: Never Smoker   . Smokeless tobacco: Never Used  . Alcohol Use: No    OB History    Grav Para Term Preterm Abortions TAB SAB Ect Mult Living                  Review of Systems  Constitutional: Negative for fever and chills.  HENT: Negative for neck pain.   Eyes: Negative for visual disturbance.  Respiratory: Negative for cough and shortness of breath.   Cardiovascular: Negative for chest pain.  Gastrointestinal: Negative for abdominal pain.  Genitourinary: Negative for flank pain.  Musculoskeletal: Negative for gait problem.  Skin: Negative for rash.  Neurological: Negative for weakness, numbness and headaches.  Hematological: Does not bruise/bleed easily.  Psychiatric/Behavioral: Negative for confusion.    Allergies  Cymbalta; Maxzide; Pravastatin sodium; and Lodine  Home Medications   Current Outpatient Rx  Name  Route  Sig  Dispense  Refill  . ALBUTEROL SULFATE HFA 108 (90 BASE) MCG/ACT IN AERS   Inhalation   Inhale 2 puffs into the lungs every 6 (six) hours as needed. For shortness of breath.         [name]  BUPROPION HCL ER (XL) 150 MG PO TB24   Oral   Take 150 mg by mouth daily.         [name] VITAMIN D 1000 UNITS PO TABS   Oral   Take 1,000 Units by mouth daily.         [name] CLOPIDOGREL BISULFATE 75 MG PO TABS   Oral   Take 75 mg by mouth daily.           . OMEGA-3 FATTY ACIDS 1000 MG PO CAPS   Oral   Take 1 capsule by mouth 2 (two) times daily.           [name] HYDROCODONE-ACETAMINOPHEN 10-325 MG PO TABS   Oral   Take 1 tablet by mouth every 8 (eight) hours as needed. Pain.         [name] LOSARTAN POTASSIUM-HCTZ 100-25 MG PO TABS   Oral   Take 1 tablet by mouth daily.           [name] NIACIN ER (ANTIHYPERLIPIDEMIC) 500 MG PO TBCR   Oral   Take 500 mg by mouth 2 (two) times daily.          [name] SIMVASTATIN 40 MG PO TABS   Oral   Take 40 mg  by mouth at bedtime.           [name] VERAPAMIL HCL ER 360 MG PO CP24   Oral   Take 360 mg by mouth daily.           [name] GABAPENTIN 300 MG PO CAPS   Oral   Take 300 mg by mouth 3 (three) times daily.           BP 157/75  Pulse 93  Temp 98.6 F (37 C) (Oral)  Resp 18  SpO2 100%  Physical Exam  Nursing note and vitals reviewed. Constitutional: [name], place, and time. She appears well-developed and well-nourished. No distress.  HENT:  Mouth/Throat: Oropharynx is clear and moist.  Eyes: No scleral icterus.  Neck: Neck supple. No tracheal deviation present.  Cardiovascular: Normal rate, regular rhythm, normal heart sounds and intact distal pulses.   Pulmonary/Chest: Effort normal and breath sounds normal. No respiratory distress.  Abdominal: Soft. Normal appearance. She exhibits no distension. There is no tenderness.       No incarc hernia.   Genitourinary:       No cva tenderness  Musculoskeletal: Normal range of motion. She exhibits no edema and no tenderness.       Good passive rom hip and knee without pain. Fem and distal pulses palp. No leg swelling. No sts. No skin changes, lesions/shingles, or erythema.   Neurological: She is alert and oriented to person, place, and time.  Skin: Skin is warm and dry. No rash noted.  Psychiatric: She has a normal mood and affect.    ED Course  Procedures (including critical care time)  Dg Hip Complete Left  08/27/2012  *RADIOLOGY REPORT*  Clinical Data: Left hip pain for 2 days, no trauma  LEFT HIP - COMPLETE 2+ VIEW  Comparison: None.  Findings: Mild left hip degenerative change is identified.  No fracture or dislocation.  No displaced pelvic fracture.  Lumbar spine degenerative change identified.  Sacroiliac joints are unremarkable.  No radiopaque foreign body.  IMPRESSION: No left hip fracture or dislocation.   Original Report Authenticated By: Christiana Pellant, M.D.       MDM  Xray.  Reviewed nursing notes and  prior  charts for additional history.   Todays heme onc note reviewed.  Pt denies any fever. No cough or uri c/o. No cp or sob. Pulse ox 100% room air, chest cta, as such, no indication for chest xray.  Pt also denies any urinary symptoms. No abd or flank pain. No fever or chills.  Pt notes intermittent sharp pain localized to left hip joint only w lifting leg or certain hip movements. No hip or leg pain without movement of hip. No report of any leg swelling. No leg swelling on exam, distal pulses palp. Symptoms and exam felt now c/w dvt.     Pt declines any pain medication, states not having any pain currently.    Pt appears stable for d/c.      Suzi Roots, MD 08/27/12 [phone]

## 2012-08-28 ENCOUNTER — Telehealth: Payer: Self-pay | Admitting: Oncology

## 2012-08-28 ENCOUNTER — Encounter: Payer: Self-pay | Admitting: Nurse Practitioner

## 2012-08-28 NOTE — Telephone Encounter (Signed)
S/w the pt and she is aware of her march 2014 appts

## 2012-09-23 ENCOUNTER — Encounter (INDEPENDENT_AMBULATORY_CARE_PROVIDER_SITE_OTHER): Payer: Self-pay | Admitting: Surgery

## 2012-09-23 ENCOUNTER — Ambulatory Visit (INDEPENDENT_AMBULATORY_CARE_PROVIDER_SITE_OTHER): Payer: Medicare Other | Admitting: Surgery

## 2012-09-23 VITALS — BP 127/84 | HR 80 | Temp 98.2°F | Resp 20 | Ht 66.0 in | Wt 294.8 lb

## 2012-09-23 DIAGNOSIS — Z853 Personal history of malignant neoplasm of breast: Secondary | ICD-10-CM

## 2012-09-23 NOTE — Progress Notes (Signed)
Subjective:     Patient ID: [name], female   DOB: 1946-07-18, 67 y.o.   MRN: [id number]  HPI She is here for long-term followup of her left breast cancer. She is off her anti-hormonal pill for now. She will be following up with the cancer center next month to discuss this. Other than occasional pain in the breast on the left side she has no other complaints  Review of Systems     Objective:   Physical Exam On exam, there is no axillary adenopathy on either side. Her incision the left breast is well-healed. There is some mild fullness at the superior aspect of the incision but otherwise it is soft. There are no other palpable masses in either breast    Assessment:     Long-term followup history of left breast cancer     Plan:     She will be getting mammograms to followup calcifications in the right breast. I will see her in one year unless there are problems

## 2012-10-02 ENCOUNTER — Other Ambulatory Visit: Payer: Self-pay | Admitting: Physician Assistant

## 2012-10-02 DIAGNOSIS — C50912 Malignant neoplasm of unspecified site of left female breast: Secondary | ICD-10-CM

## 2012-10-03 ENCOUNTER — Other Ambulatory Visit (HOSPITAL_BASED_OUTPATIENT_CLINIC_OR_DEPARTMENT_OTHER): Payer: Medicare Other | Admitting: Lab

## 2012-10-03 DIAGNOSIS — C50419 Malignant neoplasm of upper-outer quadrant of unspecified female breast: Secondary | ICD-10-CM

## 2012-10-03 DIAGNOSIS — C50912 Malignant neoplasm of unspecified site of left female breast: Secondary | ICD-10-CM

## 2012-10-03 LAB — COMPREHENSIVE METABOLIC PANEL (CC13)
Alkaline Phosphatase: 87 U/L (ref 40–150)
Creatinine: 1.5 mg/dL — ABNORMAL HIGH (ref 0.6–1.1)
Glucose: 109 mg/dl — ABNORMAL HIGH (ref 70–99)
Sodium: 144 mEq/L (ref 136–145)
Total Bilirubin: 0.32 mg/dL (ref 0.20–1.20)
Total Protein: 7.5 g/dL (ref 6.4–8.3)

## 2012-10-03 LAB — CBC WITH DIFFERENTIAL/PLATELET
Basophils Absolute: 0 10*3/uL (ref 0.0–0.1)
Eosinophils Absolute: 0.1 10*3/uL (ref 0.0–0.5)
HCT: 34.9 % (ref 34.8–46.6)
LYMPH%: 23.4 % (ref 14.0–49.7)
MCV: 80.8 fL (ref 79.5–101.0)
MONO%: 6.7 % (ref 0.0–14.0)
NEUT#: 5 10*3/uL (ref 1.5–6.5)
NEUT%: 68.2 % (ref 38.4–76.8)
Platelets: 256 10*3/uL (ref 145–400)
RBC: 4.31 10*6/uL (ref 3.70–5.45)

## 2012-10-10 ENCOUNTER — Ambulatory Visit (HOSPITAL_BASED_OUTPATIENT_CLINIC_OR_DEPARTMENT_OTHER): Payer: Medicare Other | Admitting: Oncology

## 2012-10-10 ENCOUNTER — Telehealth: Payer: Self-pay | Admitting: Oncology

## 2012-10-10 VITALS — BP 109/65 | HR 75 | Temp 98.6°F | Resp 20 | Ht 66.0 in | Wt 295.9 lb

## 2012-10-10 DIAGNOSIS — C50419 Malignant neoplasm of upper-outer quadrant of unspecified female breast: Secondary | ICD-10-CM

## 2012-10-10 DIAGNOSIS — C50412 Malignant neoplasm of upper-outer quadrant of left female breast: Secondary | ICD-10-CM

## 2012-10-10 DIAGNOSIS — Z170 Estrogen receptor positive status [ER+]: Secondary | ICD-10-CM

## 2012-10-10 DIAGNOSIS — C50912 Malignant neoplasm of unspecified site of left female breast: Secondary | ICD-10-CM

## 2012-10-10 NOTE — Telephone Encounter (Signed)
gv pt appt schedule for May. °

## 2012-10-10 NOTE — Progress Notes (Signed)
ID: [name]   DOB: [birth year]  MR#: [id number]  WUJ#:[id number]  PCP: [name], MD GYN:  SUAbigail Miyamoto OTHER MD: Antony Blackbird   HISTORY OF PRESENT ILLNESS: [name] had routine screening mammography December 2012 showing a possible mass in the left breast. This was confirmed with left diagnostic mammography 07/13/2011, with an ultrasound that day showing a hypoechoic mass measuring 6 mm in the upper outer quadrant of the left breast. Biopsy of this mass 08/02/2011 showed (SAA 13-19) and invasive ductal carcinoma, which was estrogen receptor 100% positive, progesterone receptor 100% positive, with an MIB-1 of 6%, and no HER-2 amplification.  Breast MRI 08/08/2011 showed a solitary 1.2 cm mass in the upper outer quadrant of the left breast, and the patient underwent definitive left lumpectomy 08/30/2011 for a 1.2 cm invasive ductal carcinoma, grade 1, with all 3 sentinel lymph nodes clear. That he was evaluated by Dr. Pierce Crane, and after her radiation treatments was completed he tried her on a rim index initially, then letrozole. The patient was unable to tolerate those medications. The patient's subsequent history is as detailed below  INTERVAL HISTORY: [name] returns today for followup of her breast cancer. After her last visit here she had plain films of her left hip, which showed only degenerative changes. She is here to discuss systemic therapy.  REVIEW OF SYSTEMS: The patient has aches and pains here in there, which are not more intense or persistent than baseline. She has the sensation in her upper mid chest which may be related to reflux or may be secondary to her earlier radiation. There is no cough, phlegm production, pleurisy, hemoptysis, or shortness of breath. She denies change in bowel or bladder habits. She continues to have some TM J. symptoms, although these are not disabling. She is not constipated at this time. The pain in her left leg is improved. A detailed  review of systems is otherwise noncontributory.  PAST MEDICAL HISTORY: Past Medical History  Diagnosis Date  . OSA (obstructive sleep apnea)   . Hypertension   . Hyperlipidemia   . Headache   . Blood transfusion   . Shortness of breath   . Pneumonia 4/12  . Breast cancer     left breast  . Anemia   . Anxiety   . Depression     Dr.Lewit  . Diverticulosis   . Radiation 10/29/11-11/24/11    left breast 6100 cGy  . Tinnitus   . History of colon polyps 1998    adenomatous  . Spinal stenosis     PAST SURGICAL HISTORY: Past Surgical History  Procedure Laterality Date  . Total knee arthroplasty  2006    bilateral  . Polypectomy  1990's  . Appendectomy  1978  . Thyroid cyst excision  1994  . Transphenoidal / transnasal hypophysectomy / resection pituitary tumor  6/11  . Belpharoptosis repair    . Breast lumpectomy  08/2011    left    FAMILY HISTORY Family History  Problem Relation Age of Onset  . Heart disease Father   . Clotting disorder Father   . Stroke Father   . Stroke Mother   . Diabetes Mother   . Colon cancer Neg Hx    the patient's father died at the age of 76 from a ruptured aneurysm (the patient is not sure if this was cerebral or thoracic). The patient's mother died at the age of 12 from a stroke. The patient had one brother and one sister. Her sister has  recently been diagnosed with breast cancer at the age of 20. There is no other history of breast or ovarian cancer in the family.  GYNECOLOGIC HISTORY: Menarche age 63, first live birth age 27, the patient is GX P2, change of life at age 18. She never took hormone replacement.  SOCIAL HISTORY: That he is retired. She used to do clerical work. She has been married 50 years. Her husband, Onalee Hua, owns his own Technical sales engineer business. Daughter Aurther Loft works for [employer] here in [place]. Son Richrd Sox works as a Insurance risk surveyor in [employer]. The patient has 3 grandchildren. She attends a local [employer]   ADVANCED  DIRECTIVES:  HEALTH MAINTENANCE: History  Substance Use Topics  . Smoking status: Never Smoker   . Smokeless tobacco: Never Used  . Alcohol Use: No     Colonoscopy:  PAP:  Bone density:  Lipid panel:  Allergies  Allergen Reactions  . Cymbalta (Duloxetine Hcl)     Stomach pain  . Maxzide (Hydrochlorothiazide W-Triamterene) Other (See Comments)    Cramping   . Pravastatin Sodium Other (See Comments)    Headache   . Lodine (Etodolac) Itching    Current Outpatient Prescriptions  Medication Sig Dispense Refill  . albuterol (PROVENTIL HFA;VENTOLIN HFA) 108 (90 BASE) MCG/ACT inhaler Inhale 2 puffs into the lungs every 6 (six) hours as needed. For shortness of breath.      [name] buPROPion (WELLBUTRIN XL) 150 MG 24 hr tablet Take 150 mg by mouth daily.      . cholecalciferol (VITAMIN D) 1000 UNITS tablet Take 1,000 Units by mouth daily.      . clopidogrel (PLAVIX) 75 MG tablet Take 75 mg by mouth daily.        . fish oil-omega-3 fatty acids 1000 MG capsule Take 1 capsule by mouth 2 (two) times daily.        [name] HYDROcodone-acetaminophen (NORCO) 10-325 MG per tablet Take 1 tablet by mouth every 8 (eight) hours as needed. Pain.      [name] losartan-hydrochlorothiazide (HYZAAR) 100-25 MG per tablet Take 1 tablet by mouth daily.        . niacin (NIASPAN) 500 MG CR tablet Take 500 mg by mouth 2 (two) times daily.       . simvastatin (ZOCOR) 40 MG tablet Take 40 mg by mouth at bedtime.        . verapamil (VERELAN PM) 360 MG 24 hr capsule Take 360 mg by mouth daily.         No current facility-administered medications for this visit.   Facility-Administered Medications Ordered in Other Visits  Medication Dose Route Frequency Provider Last Rate Last Dose  . silver sulfADIAZINE (SILVADENE) 1 % cream   Topical BID Billie Lade, MD   1 application at 11/14/11 [phone]    OBJECTIVE: Middle-aged African American woman in no acute distress  Filed Vitals:   10/10/12 1531  BP: 109/65  Pulse: 75  Temp:  98.6 F (37 C)  Resp: 20     Body mass index is 47.78 kg/(m^2).    ECOG FS: 1  Sclerae unicteric Oropharynx clear No cervical or supraclavicular adenopathy Lungs no rales or rhonchi Heart regular rate and rhythm Abd obese, benign MSK no focal spinal tenderness, no peripheral edema Neuro: nonfocal, alert, appropriate affect Breasts: The right breast is unremarkable. The left breast is status post lumpectomy and radiation. There is minimal hyperpigmentation. There is no evidence of local recurrence. The left axilla is benign.   LAB RESULTS:  Lab Results  Component Value Date   WBC 7.3 10/03/2012   NEUTROABS 5.0 10/03/2012   HGB 11.5* 10/03/2012   HCT 34.9 10/03/2012   MCV 80.8 10/03/2012   PLT 256 10/03/2012      Chemistry      Component Value Date/Time   NA 144 10/03/2012 1322   NA 142 01/17/2012 1431   K 3.5 10/03/2012 1322   K 3.8 01/17/2012 1431   CL 106 10/03/2012 1322   CL 104 01/17/2012 1431   CO2 29 10/03/2012 1322   CO2 28 01/17/2012 1431   BUN 19.4 10/03/2012 1322   BUN 21 01/17/2012 1431   CREATININE 1.5* 10/03/2012 1322   CREATININE 1.36* 01/17/2012 1431      Component Value Date/Time   CALCIUM 10.9* 10/03/2012 1322   CALCIUM 10.1 01/17/2012 1431   ALKPHOS 87 10/03/2012 1322   ALKPHOS 63 01/17/2012 1431   AST 13 10/03/2012 1322   AST 11 01/17/2012 1431   ALT 16 10/03/2012 1322   ALT 19 01/17/2012 1431   BILITOT 0.32 10/03/2012 1322   BILITOT 0.3 01/17/2012 1431       Lab Results  Component Value Date   LABCA2 12 01/17/2012    No components found with this basename: [id number]    No results found for this basename: INR,  in the last 168 hours  Urinalysis    Component Value Date/Time   COLORURINE YELLOW 11/09/2010 1602   APPEARANCEUR CLEAR 11/09/2010 1602   LABSPEC 1.018 11/09/2010 1602   PHURINE 5.0 11/09/2010 1602   GLUCOSEU NEGATIVE 11/09/2010 1602   HGBUR SMALL* 11/09/2010 1602   BILIRUBINUR NEGATIVE 11/09/2010 1602   KETONESUR NEGATIVE 11/09/2010 1602   PROTEINUR NEGATIVE 11/09/2010  1602   UROBILINOGEN 0.2 11/09/2010 1602   NITRITE NEGATIVE 11/09/2010 1602   LEUKOCYTESUR NEGATIVE 11/09/2010 1602    STUDIES: No results found.   ASSESSMENT: 67 y.o. [place] woman status post left lumpectomy and sentinel lymph node sampling 08/30/2011 for a pT1c pN0, stage IA invasive ductal carcinoma, grade 1, estrogen and progesterone receptor positive, both at 100%, with an MIB-1 of 6%, and no HER-2 amplification.  (1) Oncotype DX score of 3 predicted a distant recurrence within 10 years of 3% if the patient's only adjuvant treatment was tamoxifen for 5 years  (2) Adjuvant! Online would quote her a risk of recurrence of 16% (including in breast recurrences) dropping to 8% with 5 years of an aromatase inhibitor  (3) completed adjuvant radiation treatments 11/24/2011  (4) the patient was unable to take letrozole or anastrozole secondary to concerns regarding jaw pain and constipation.  (5) exemestane to be started 10/11/2012   (6) persistent moderate elevation of the blood calcium level; will send a PTH with the next set of labs.  PLAN: I went over [name]'s situation in detail with her. She has a very good prognosis, but she will clearly benefit from antiestrogen therapy both in terms of preventing distant disease recurrence and in preventing a new breast cancer from developing. I would prefer not to use tamoxifen because of concerns regarding blood clots, but she has already tried bolus nonsteroidal aromatase inhibitors without success.  I am not convinced that either her jaw pain or constipation was due to the aromatase inhibitors, but since we have a third choice that is what we discussed today. She will be started on exemestane, and I have encouraged her to "push through" any symptoms she may develop so she can have at least 2 months of therapy  under her belt. She will see me [name] again mid May. If she is tolerating the exemestane well at that time we will probably start seeing her on a  once a year basis. Otherwise we will consider tamoxifen.  She knows to call for any problems that may develop before the next visit.   MAGRINAT,GUSTAV C    10/10/2012

## 2012-10-11 ENCOUNTER — Encounter: Payer: Self-pay | Admitting: Pulmonary Disease

## 2012-10-11 ENCOUNTER — Ambulatory Visit (INDEPENDENT_AMBULATORY_CARE_PROVIDER_SITE_OTHER): Payer: Medicare Other | Admitting: Pulmonary Disease

## 2012-10-11 VITALS — BP 138/72 | HR 73 | Temp 98.1°F | Ht 66.0 in | Wt 294.0 lb

## 2012-10-11 DIAGNOSIS — G4733 Obstructive sleep apnea (adult) (pediatric): Secondary | ICD-10-CM

## 2012-10-11 NOTE — Assessment & Plan Note (Signed)
The patient is wearing CPAP compliantly, and feels that it greatly helped her sleep and daytime alertness.  She is having new mask or pressure issues, but would like to have her machine checked to make sure that it is functioning properly.  I have encouraged her to work aggressively on weight loss, and to avoid napping if possible during the day.

## 2012-10-11 NOTE — Progress Notes (Signed)
  Subjective:    Patient ID: [name], female    DOB: 1946-02-27, 67 y.o.   MRN: [id number]  HPI The patient comes in today for followup of her obstructive sleep apnea.  She is wearing CPAP compliantly, and feels that it helps her sleep and daytime alertness.  She is having no issues with her mask, and feels the pressure is adequate.  Her weight is unchanged from the last visit, and I have encouraged her to work aggressively on weight loss.   Review of Systems  Constitutional: Negative for fever and unexpected weight change.  HENT: Negative for ear pain, nosebleeds, congestion, sore throat, rhinorrhea, sneezing, trouble swallowing, dental problem, postnasal drip and sinus pressure.   Eyes: Negative for redness and itching.  Respiratory: Negative for cough, chest tightness, shortness of breath and wheezing.   Cardiovascular: Positive for palpitations ( thinks d/t radiation). Negative for leg swelling.  Gastrointestinal: Negative for nausea and vomiting.  Genitourinary: Negative for dysuria.  Musculoskeletal: Negative for joint swelling.  Skin: Negative for rash.  Neurological: Positive for headaches.  Hematological: Does not bruise/bleed easily.  Psychiatric/Behavioral: Positive for dysphoric mood. The patient is nervous/anxious.        Objective:   Physical Exam Obese female in no acute distress Nose with purulent discharge noted No skin breakdown or pressure necrosis from the CPAP mask Neck without lymphadenopathy or thyromegaly Lower extremities with mild edema, no cyanosis alert and oriented, moves all 4 extremities.       Assessment & Plan:

## 2012-10-11 NOTE — Patient Instructions (Addendum)
Continue with cpap, and keep up with mask changes and supplies. No napping during the day if you are having trouble sleeping at night.  Work on weight loss followup with me in one year

## 2012-10-23 ENCOUNTER — Other Ambulatory Visit: Payer: Self-pay | Admitting: *Deleted

## 2012-10-23 ENCOUNTER — Other Ambulatory Visit: Payer: Self-pay | Admitting: Otolaryngology

## 2012-10-23 DIAGNOSIS — C50919 Malignant neoplasm of unspecified site of unspecified female breast: Secondary | ICD-10-CM

## 2012-10-23 DIAGNOSIS — M26629 Arthralgia of temporomandibular joint, unspecified side: Secondary | ICD-10-CM

## 2012-10-24 ENCOUNTER — Other Ambulatory Visit: Payer: Self-pay | Admitting: *Deleted

## 2012-10-24 DIAGNOSIS — C50919 Malignant neoplasm of unspecified site of unspecified female breast: Secondary | ICD-10-CM

## 2012-10-24 MED ORDER — TAMOXIFEN CITRATE 20 MG PO TABS: 20.0000 mg | ORAL_TABLET | Freq: Every day | ORAL | Status: DC

## 2012-10-28 ENCOUNTER — Ambulatory Visit
Admission: RE | Admit: 2012-10-28 | Discharge: 2012-10-28 | Disposition: A | Discharge status: Home / Self Care | Payer: Medicare Other | Source: Ambulatory Visit | Attending: Otolaryngology | Admitting: Otolaryngology

## 2012-10-28 DIAGNOSIS — M26629 Arthralgia of temporomandibular joint, unspecified side: Secondary | ICD-10-CM

## 2012-10-28 MED ORDER — IOHEXOL 300 MG/ML  SOLN
50.0000 mL | Freq: Once | INTRAMUSCULAR | Status: AC | PRN
  Administered 2012-10-28: 50 mL via INTRAVENOUS

## 2012-11-18 ENCOUNTER — Ambulatory Visit
Admission: RE | Admit: 2012-11-18 | Discharge: 2012-11-18 | Disposition: A | Discharge status: Home / Self Care | Payer: Medicare Other | Source: Ambulatory Visit | Attending: Radiation Oncology | Admitting: Radiation Oncology

## 2012-11-18 ENCOUNTER — Encounter: Payer: Self-pay | Admitting: Radiation Oncology

## 2012-11-18 ENCOUNTER — Encounter: Payer: Self-pay | Admitting: *Deleted

## 2012-11-18 ENCOUNTER — Other Ambulatory Visit: Payer: Self-pay | Admitting: *Deleted

## 2012-11-18 ENCOUNTER — Encounter: Payer: Medicare Other | Admitting: *Deleted

## 2012-11-18 VITALS — BP 145/67 | HR 76 | Temp 98.2°F | Resp 20 | Wt 296.0 lb

## 2012-11-18 DIAGNOSIS — C50912 Malignant neoplasm of unspecified site of left female breast: Secondary | ICD-10-CM

## 2012-11-18 NOTE — Progress Notes (Signed)
Pt denies pain but states she's begun to have "squiggly feelings in the back of her throat that come and go, do not occur daily". She states her skin of left breast "is almost it's normal color". She denies fatigue, loss of appetite. She is taking Tamoxifen, states she is not having many hot flashes. She states the pain she is having in her jaw when eating is from arthritis.

## 2012-11-18 NOTE — Progress Notes (Signed)
Radiation Oncology         [phone] ________________________________  Name: [name] MRN: [id number]  Date: 11/18/2012  DOB: 12-09-1945  Follow-Up Visit Note  CC: [name], MD  Shelly Rubenstein, MD  Diagnosis:   Stage I left breast cancer  Interval Since Last Radiation:  12  months  Narrative:  The patient returns today for routine follow-up.  She is doing well. She occasionally will have sharp shooting pain within the left breast but nothing consistent.  She denies any nipple discharge or bleeding.  She continues on tamoxifen and seems to tolerate this well.  She denies any swelling in her left arm or hand.                             ALLERGIES:  is allergic to cymbalta; maxzide; pravastatin sodium; and lodine.  Meds: Current Outpatient Prescriptions  Medication Sig Dispense Refill  . buPROPion (WELLBUTRIN XL) 150 MG 24 hr tablet Take 150 mg by mouth daily.      . cholecalciferol (VITAMIN D) 1000 UNITS tablet Take 1,000 Units by mouth daily.      . clopidogrel (PLAVIX) 75 MG tablet Take 75 mg by mouth daily.        . fish oil-omega-3 fatty acids 1000 MG capsule Take 1 capsule by mouth 2 (two) times daily.        [name] HYDROcodone-acetaminophen (NORCO) 10-325 MG per tablet Take 1 tablet by mouth every 8 (eight) hours as needed. Pain.      [name] losartan-hydrochlorothiazide (HYZAAR) 100-25 MG per tablet Take 1 tablet by mouth daily.        . niacin (NIASPAN) 500 MG CR tablet Take 500 mg by mouth 2 (two) times daily.       . simvastatin (ZOCOR) 40 MG tablet Take 40 mg by mouth at bedtime.        . tamoxifen (NOLVADEX) 20 MG tablet Take 1 tablet (20 mg total) by mouth daily.  30 tablet  6  . verapamil (VERELAN PM) 240 MG 24 hr capsule Take 240 mg by mouth at bedtime.      [name] nystatin-triamcinolone (MYCOLOG II) cream        No current facility-administered medications for this encounter.   Facility-Administered Medications Ordered in Other Encounters  Medication Dose  Route Frequency Provider Last Rate Last Dose  . silver sulfADIAZINE (SILVADENE) 1 % cream   Topical BID Billie Lade, MD   1 application at 11/14/11 4098    Physical Findings: The patient is in no acute distress. Patient is alert and oriented.  weight is 296 lb (134.265 kg). Her oral temperature is 98.2 F (36.8 C). Her blood pressure is 145/67 and her pulse is 76. Her respiration is 20. [name]  No palpable supraclavicular or axillary adenopathy. The lungs are clear to auscultation. The heart has a regular rhythm and rate.  Examination right breast reveals no mass or nipple discharge. Examination of the left breast reveals diffuse hyperpigmentation changes.  There is some edema noted in the breast area. There is some induration noted at the lumpectomy scar but no dominant masses appreciated in the breast. There is no nipple discharge or bleeding.  Lab Findings: Lab Results  Component Value Date   WBC 7.3 10/03/2012   HGB 11.5* 10/03/2012   HCT 34.9 10/03/2012   MCV 80.8 10/03/2012   PLT 256 10/03/2012    @LASTCHEM @  Radiographic Findings: Ct Soft  Tissue Neck W Contrast  10/28/2012  *RADIOLOGY REPORT*  Clinical Data: The left temporal mandibular region pain and left facial pain.  Previous surgery for thyroid cyst.  History of left breast cancer.  CT NECK WITH CONTRAST  Technique:  Multidetector CT imaging of the neck was performed with intravenous contrast.  Contrast: 50mL OMNIPAQUE IOHEXOL 300 MG/ML  SOLN  Comparison: MRI brain 01/25/2012  Findings: Lung apices are clear.  No superior mediastinal pathology.  Limited visualization of the intracranial contents does not show any abnormality other than atherosclerosis.  Both parotid glands are normal.  Both submandibular glands are normal.  The right lobe of the thyroid is small or has been previously resected.  The left lobe of the thyroid does not show any focal abnormality.  There are no pathologically enlarged lymph nodes in the neck.  There is  atherosclerosis in the carotid bifurcation regions bilaterally.  There is degenerative cervical spondylosis.  The temporomandibular joint on the right appears normal.  On the left, there is a chronic- appearing arthropathy of the joint with at least one erosion of the mandibular condyle and osteophyte formation.  These abnormalities can be appreciated in retrospect on the previous MRI study and appear similar.  IMPRESSION: Arthropathy at the temporomandibular joint on the left with osteophyte formation and at least one erosion.  This is presumed to be degenerative arthritis.   Original Report Authenticated By: Paulina Fusi, M.D.     Impression:  No evidence of recurrence on clinical exam today.  Plan:  When necessary followup in radiation oncology. Patient will continue close followup in medical oncology  _____________________________________  -----------------------------------  Billie Lade, PhD, MD

## 2012-11-18 NOTE — Progress Notes (Signed)
11/18/2012  Patient in to clinic today for Month 12 study visit. Questionnaires provided to patient for completion upon arrival to the clinic.  Patient states she is not using any products on her skin and has used none since her Month 6 visit. Patient states that she did not begin exemestane in March due to the expense, thus she began taking tamoxifen instead. There has been no recurrence of her breast cancer.  Questionnaires completed by patient and RTOG SOMA form completed by Dr. Roselind Messier. Thanked patient for her participation in the research study.  Cindy S. Clelia Croft BSN, RN, CCRP 11/18/2012 4:18 PM

## 2012-12-09 ENCOUNTER — Ambulatory Visit (HOSPITAL_BASED_OUTPATIENT_CLINIC_OR_DEPARTMENT_OTHER): Payer: Medicare Other | Admitting: Family

## 2012-12-09 ENCOUNTER — Telehealth: Payer: Self-pay | Admitting: *Deleted

## 2012-12-09 ENCOUNTER — Encounter: Payer: Self-pay | Admitting: Family

## 2012-12-09 ENCOUNTER — Other Ambulatory Visit (HOSPITAL_BASED_OUTPATIENT_CLINIC_OR_DEPARTMENT_OTHER): Payer: Medicare Other | Admitting: Lab

## 2012-12-09 VITALS — BP 140/68 | HR 71 | Temp 98.7°F | Resp 20 | Ht 66.0 in | Wt 294.0 lb

## 2012-12-09 DIAGNOSIS — C50912 Malignant neoplasm of unspecified site of left female breast: Secondary | ICD-10-CM

## 2012-12-09 DIAGNOSIS — C50419 Malignant neoplasm of upper-outer quadrant of unspecified female breast: Secondary | ICD-10-CM

## 2012-12-09 DIAGNOSIS — L738 Other specified follicular disorders: Secondary | ICD-10-CM

## 2012-12-09 DIAGNOSIS — L678 Other hair color and hair shaft abnormalities: Secondary | ICD-10-CM

## 2012-12-09 DIAGNOSIS — C50412 Malignant neoplasm of upper-outer quadrant of left female breast: Secondary | ICD-10-CM

## 2012-12-09 DIAGNOSIS — E876 Hypokalemia: Secondary | ICD-10-CM

## 2012-12-09 DIAGNOSIS — C50919 Malignant neoplasm of unspecified site of unspecified female breast: Secondary | ICD-10-CM

## 2012-12-09 DIAGNOSIS — Z170 Estrogen receptor positive status [ER+]: Secondary | ICD-10-CM

## 2012-12-09 LAB — CBC WITH DIFFERENTIAL/PLATELET
BASO%: 0.7 % (ref 0.0–2.0)
HCT: 34.8 % (ref 34.8–46.6)
LYMPH%: 22.2 % (ref 14.0–49.7)
MCH: 26.7 pg (ref 25.1–34.0)
MCHC: 32.6 g/dL (ref 31.5–36.0)
MCV: 81.8 fL (ref 79.5–101.0)
MONO%: 4.1 % (ref 0.0–14.0)
NEUT%: 72 % (ref 38.4–76.8)
Platelets: 255 10*3/uL (ref 145–400)
RBC: 4.25 10*6/uL (ref 3.70–5.45)

## 2012-12-09 LAB — COMPREHENSIVE METABOLIC PANEL (CC13)
ALT: 16 U/L (ref 0–55)
Alkaline Phosphatase: 78 U/L (ref 40–150)
Creatinine: 1.2 mg/dL — ABNORMAL HIGH (ref 0.6–1.1)
Sodium: 141 mEq/L (ref 136–145)
Total Bilirubin: 0.35 mg/dL (ref 0.20–1.20)
Total Protein: 7.5 g/dL (ref 6.4–8.3)

## 2012-12-09 MED ORDER — TAMOXIFEN CITRATE 20 MG PO TABS: 20.0000 mg | ORAL_TABLET | Freq: Every day | ORAL | Status: DC

## 2012-12-09 NOTE — Patient Instructions (Addendum)
Please contact us at [phone] if you have any questions or concerns.  Please continue to do well and enjoy life!!!  Get plenty of rest, drink plenty of water, exercise daily, eat a balanced diet.  Continue to take Vitamin D3 1000 IUs daily.   For underarm folliculitis - warm soaks 3 times daily for 5 days,  Benadryl 25 mg daily or Loratadine 10 mg daily and apply Cortisone ointment twice daily.  For jaw pain - get referred to and/or contact a Maxillofacial physician/surgeon  Take B Complex vitamins for hair and joint pain.  Eat foods rich in potassium foods for low potassium  As for a referral to a Urologist [facility]) if frequent urination continues.   Results for orders placed in visit on 12/09/12 (from the past 24 hour(s))  CBC WITH DIFFERENTIAL     Status: Abnormal   Collection Time    12/09/12  2:23 PM      Result Value Range   WBC 7.9  3.9 - 10.3 10e3/uL   NEUT# 5.7  1.5 - 6.5 10e3/uL   HGB 11.3 (*) 11.6 - 15.9 g/dL   HCT 16.1  09.6 - 04.5 %   Platelets 255  145 - 400 10e3/uL   MCV 81.8  79.5 - 101.0 fL   MCH 26.7  25.1 - 34.0 pg   MCHC 32.6  31.5 - 36.0 g/dL   RBC 4.09  8.11 - 9.14 10e6/uL   RDW 16.2 (*) 11.2 - 14.5 %   lymph# 1.7  0.9 - 3.3 10e3/uL   MONO# 0.3  0.1 - 0.9 10e3/uL   Eosinophils Absolute 0.1  0.0 - 0.5 10e3/uL   Basophils Absolute 0.1  0.0 - 0.1 10e3/uL   NEUT% 72.0  38.4 - 76.8 %   LYMPH% 22.2  14.0 - 49.7 %   MONO% 4.1  0.0 - 14.0 %   EOS% 1.0  0.0 - 7.0 %   BASO% 0.7  0.0 - 2.0 %   Narrative:    Performed At:  [facility]               [address]               [place], [place] [address]  COMPREHENSIVE METABOLIC PANEL (CC13)     Status: Abnormal   Collection Time    12/09/12  2:23 PM      Result Value Range   Sodium 141  136 - 145 mEq/L   Potassium 3.2 (*) 3.5 - 5.1 mEq/L   Chloride 105  98 - 107 mEq/L   CO2 25  22 - 29 mEq/L   Glucose 177 (*) 70 - 99 mg/dl   BUN 62.1  7.0 - 30.8 mg/dL   Creatinine 1.2 (*) 0.6  - 1.1 mg/dL   Total Bilirubin 6.57  0.20 - 1.20 mg/dL   Alkaline Phosphatase 78  40 - 150 U/L   AST 16  5 - 34 U/L   ALT 16  0 - 55 U/L   Total Protein 7.5  6.4 - 8.3 g/dL   Albumin 3.4 (*) 3.5 - 5.0 g/dL   Calcium 84.6  8.4 - 96.2 mg/dL   Narrative:    Nonfasting Glucose expected range = less than 140.Performed At:  [facility]               [address]  [place], [place] [address]

## 2012-12-09 NOTE — Telephone Encounter (Signed)
appts made and printed for 06/12/13. Pt is aware that i will call her with the appts for Dr. Magnus Ivan and PT..td

## 2012-12-09 NOTE — Progress Notes (Signed)
ID: [name]   DOB: 1946/01/11  MR#: [id number]  WUJ#:[id number]  PCP: [name], MD GYN:  SUAbigail Miyamoto, MD OTHER MD: Antony Blackbird, MD   HISTORY OF PRESENT ILLNESS: [name] had routine screening mammography December 2012 showing a possible mass in the left breast. This was confirmed with left diagnostic mammography 07/13/2011, with an ultrasound that day showing a hypoechoic mass measuring 6 mm in the upper outer quadrant of the left breast. Biopsy of this mass 08/02/2011 showed (SAA 13-19) and invasive ductal carcinoma, which was estrogen receptor 100% positive, progesterone receptor 100% positive, with an MIB-1 of 6%, and no HER-2 amplification.  Breast MRI 08/08/2011 showed a solitary 1.2 cm mass in the upper outer quadrant of the left breast, and the patient underwent definitive left lumpectomy 08/30/2011 for a 1.2 cm invasive ductal carcinoma, grade 1, with all 3 sentinel lymph nodes clear. That he was evaluated by Dr. Pierce Crane, and after her radiation treatments was completed he tried her on a rim index initially, then letrozole. The patient was unable to tolerate those medications. The patient's subsequent history is as detailed below  INTERVAL HISTORY: [name] returns today for followup of her left breast invasive ductal carcinoma.  She was last seen by Dr. Darnelle Catalan on 10/10/2012.  Since her last office visit, she began antiestrogen therapy with Tamoxifen in 09/2012 and has been tolerating the medication fairly well.  She returns today for followup since the initiation of antiestrogen therapy with Tamoxifen.  REVIEW OF SYSTEMS: The patient reports that since the initiation of systemic antiestrogen therapy with Tamoxifen, her hot flashes have improved, her constipation symptoms have improved, she continues to have night sweats but states that her night sweats are not severe.  She has ongoing aches and pains here in there, which are not more intense or persistent than  baseline.  She continues to have some TMJ. symptoms, although these are not disabling. She is not constipated at this time. The pain in her left leg is improved.  The patient showed me an area in her left axillary region that appears to be folliculitis and states that the "bump" appeared shortly after she shaved the area. A detailed review of systems is otherwise noncontributory.  PAST MEDICAL HISTORY: Past Medical History  Diagnosis Date  . OSA (obstructive sleep apnea)   . Hypertension   . Hyperlipidemia   . Headache   . Blood transfusion   . Shortness of breath   . Pneumonia 4/12  . Breast cancer     left breast  . Anemia   . Anxiety   . Depression     Dr.Lewit  . Diverticulosis   . Radiation 10/29/11-11/24/11    left breast 6100 cGy  . Tinnitus   . History of colon polyps 1998    adenomatous  . Spinal stenosis     PAST SURGICAL HISTORY: Past Surgical History  Procedure Laterality Date  . Total knee arthroplasty  2006    bilateral  . Polypectomy  1990's  . Appendectomy  1978  . Thyroid cyst excision  1994  . Transphenoidal / transnasal hypophysectomy / resection pituitary tumor  6/11  . Belpharoptosis repair    . Breast lumpectomy  08/2011    left    FAMILY HISTORY Family History  Problem Relation Age of Onset  . Heart disease Father   . Clotting disorder Father   . Stroke Father   . Stroke Mother   . Diabetes Mother   . Colon cancer Neg  Hx   . Cancer Sister 62    Breast Cancer  The patient's father died at the age of 86 from a ruptured aneurysm (the patient is not sure if this was cerebral or thoracic). The patient's mother died at the age of 21 from a stroke. The patient had one brother and one sister. Her sister has recently been diagnosed with breast cancer at the age of 11. There is no other history of breast or ovarian cancer in the family.  GYNECOLOGIC HISTORY: Menarche age 89, first live birth age 51, the patient is GX P2, change of life at age 72. She  never took hormone replacement.  SOCIAL HISTORY: Her husband is retired.  She is retired from a clerical work Event organiser. She has been married 50 years. Her husband, Onalee Hua, owns his own Technical sales engineer business. Daughter Aurther Loft works for [employer] here in [place]. Son Richrd Sox works as a Insurance risk surveyor in [employer]. The patient has 3 grandchildren. She attends a local [employer]   ADVANCED DIRECTIVES: Not on file  HEALTH MAINTENANCE: History  Substance Use Topics  . Smoking status: Never Smoker   . Smokeless tobacco: Never Used  . Alcohol Use: No     Colonoscopy: Not on file  PAP: Not on file  Bone density: Her last bone density scan on 09/28/2011 showed a T score of 2.0 (normal).  Lipid panel: Not on file  Allergies  Allergen Reactions  . Cymbalta (Duloxetine Hcl)     Stomach pain  . Maxzide (Hydrochlorothiazide W-Triamterene) Other (See Comments)    Cramping   . Pravastatin Sodium Other (See Comments)    Headache   . Lodine (Etodolac) Itching    Current Outpatient Prescriptions  Medication Sig Dispense Refill  . buPROPion (WELLBUTRIN XL) 150 MG 24 hr tablet Take 150 mg by mouth daily.      . cholecalciferol (VITAMIN D) 1000 UNITS tablet Take 1,000 Units by mouth daily.      . clopidogrel (PLAVIX) 75 MG tablet Take 75 mg by mouth daily.        . fish oil-omega-3 fatty acids 1000 MG capsule Take 1 capsule by mouth 2 (two) times daily.        [name] HYDROcodone-acetaminophen (NORCO) 10-325 MG per tablet Take 1 tablet by mouth every 8 (eight) hours as needed. Pain.      [name] losartan-hydrochlorothiazide (HYZAAR) 100-25 MG per tablet Take 1 tablet by mouth daily.        . niacin (NIASPAN) 500 MG CR tablet Take 500 mg by mouth 2 (two) times daily.       [name] nystatin-triamcinolone (MYCOLOG II) cream Apply 1 application topically 2 (two) times daily as needed.       . simvastatin (ZOCOR) 40 MG tablet Take 40 mg by mouth at bedtime.        . tamoxifen (NOLVADEX) 20 MG tablet Take 1 tablet (20 mg  total) by mouth daily.  90 tablet  8  . verapamil (VERELAN PM) 240 MG 24 hr capsule Take 240 mg by mouth at bedtime.       No current facility-administered medications for this visit.   Facility-Administered Medications Ordered in Other Visits  Medication Dose Route Frequency Provider Last Rate Last Dose  . silver sulfADIAZINE (SILVADENE) 1 % cream   Topical BID Billie Lade, MD   1 application at 11/14/11 1610    OBJECTIVE: Middle-aged African American woman in no acute distress  Filed Vitals:   12/09/12 1509  BP: 140/68  Pulse: 71  Temp: 98.7 F (37.1 C)  Resp: 20     Body mass index is 47.48 kg/(m^2).    ECOG FS: 1  Head is normocephalic, alopecia noted, the patient is wearing a wig Sclerae unicteric, arcus senilis noted Oropharynx clear No cervical or supraclavicular adenopathy Lungs no rales or rhonchi Heart regular rate and rhythm, chronic right jugular vein visible pulsation/distention Abd obese, excessive habitus MSK  no focal spinal tenderness, no peripheral edema, limited upper extremity and lower extremity range of motion  Neuro: nonfocal, alert, appropriate affect Breasts: Deferred, the left axilla has a small area of folliculitis Skin: Numerous nevi on trunk and cervical areas    LAB RESULTS: Lab Results  Component Value Date   WBC 7.9 12/09/2012   NEUTROABS 5.7 12/09/2012   HGB 11.3* 12/09/2012   HCT 34.8 12/09/2012   MCV 81.8 12/09/2012   PLT 255 12/09/2012      Chemistry      Component Value Date/Time   NA 141 12/09/2012 1423   NA 142 01/17/2012 1431   K 3.2* 12/09/2012 1423   K 3.8 01/17/2012 1431   CL 105 12/09/2012 1423   CL 104 01/17/2012 1431   CO2 25 12/09/2012 1423   CO2 28 01/17/2012 1431   BUN 17.7 12/09/2012 1423   BUN 21 01/17/2012 1431   CREATININE 1.2* 12/09/2012 1423   CREATININE 1.36* 01/17/2012 1431      Component Value Date/Time   CALCIUM 10.1 12/09/2012 1423   CALCIUM 10.1 01/17/2012 1431   ALKPHOS 78 12/09/2012 1423   ALKPHOS 63  01/17/2012 1431   AST 16 12/09/2012 1423   AST 11 01/17/2012 1431   ALT 16 12/09/2012 1423   ALT 19 01/17/2012 1431   BILITOT 0.35 12/09/2012 1423   BILITOT 0.3 01/17/2012 1431       Lab Results  Component Value Date   LABCA2 12 01/17/2012    No components found with this basename: [id number]    No results found for this basename: INR,  in the last 168 hours  Urinalysis    Component Value Date/Time   COLORURINE YELLOW 11/09/2010 1602   APPEARANCEUR CLEAR 11/09/2010 1602   LABSPEC 1.018 11/09/2010 1602   PHURINE 5.0 11/09/2010 1602   GLUCOSEU NEGATIVE 11/09/2010 1602   HGBUR SMALL* 11/09/2010 1602   BILIRUBINUR NEGATIVE 11/09/2010 1602   KETONESUR NEGATIVE 11/09/2010 1602   PROTEINUR NEGATIVE 11/09/2010 1602   UROBILINOGEN 0.2 11/09/2010 1602   NITRITE NEGATIVE 11/09/2010 1602   LEUKOCYTESUR NEGATIVE 11/09/2010 1602    STUDIES: No results found.   ASSESSMENT: 67 y.o. [place] woman status post left lumpectomy and sentinel lymph node sampling 08/30/2011 for a pT1c pN0, stage IA invasive ductal carcinoma, grade 1, estrogen and progesterone receptor positive, both at 100%, with an MIB-1 of 6%, and no HER-2 amplification.  (1) Oncotype DX score of 3 predicted a distant recurrence within 10 years of 3% if the patient's only adjuvant treatment was tamoxifen for 5 years  (2) Adjuvant! Online would quote her a risk of recurrence of 16% (including in breast recurrences) dropping to 8% with 5 years of an aromatase inhibitor  (3) Completed adjuvant radiation treatments 11/24/2011  (4)  The patient was unable to take letrozole or anastrozole secondary to concerns regarding jaw pain and constipation.  (5)  Tamoxifen  to be started 10/11/2012   (6) Persistent moderate elevation of the blood calcium level; will send a PTH with the next set of labs.  (7) Hypokalemia   (  8)  Left axillary folliculitis  PLAN: Dr. Darnelle Catalan has previously reviewed with Mrs. Daniely her situation in detail with  her. She has a very good prognosis,  and it was explained that she will clearly benefit from antiestrogen therapy both in terms of preventing distant disease recurrence and in preventing a new breast cancer from developing. The patient has already tried aromatase inhibitors without success. An electronic prescription for Tamoxifen refill was sent to the patient's mail order pharmacy #[id number] with 8 refills.  The patient states that she has a mammogram scheduled for 12/2012 scheduled by [facility].  The patient will be referred to physical therapy for her continued limited range of motion in bilateral upper extremities and bilateral lower extremities.    The patient was asked to consume foods rich in potassium daily for hypokalemia.  She was provided a copy of her laboratories and a list of foods that are rich in potassium.  A copy of the patient's laboratories will be forwarded to the patient's primary care physician.  Of note, the patient takes HCTZ in combination with Losartan for hypertension.   Mrs. Dethlefs was asked to forego shaving until folliculitis has cleared.  When she does resume shaving her axillary areas, she was asked to use an Neurosurgeon.  The patient was asked to use warm soaks in addition to Benadryl or Loratadine daily and cortisone twice daily to help ease the symptoms of the folliculitis.  The patient was also asked to inquire about urology and maxillofacial referrals from her primary care physician for her frequent urination and ongoing jaw pain, respectively.    We asked that the patient alternate office visits between [name] and her surgeon, Dr. Magnus Ivan in which she should see him in 02/2013 and then see [name] again in 05/2013.   We plan to see the patient again in 05/2013 at which time we will check a CBC, CMP, LDH, Ferritin and vitamin D level.    The patient is encouraged to contact us in the interim if she has any problems, questions or concerns.  Larina Bras NP-C 12/09/2012  7:28 PM

## 2012-12-10 ENCOUNTER — Telehealth: Payer: Self-pay | Admitting: *Deleted

## 2012-12-10 ENCOUNTER — Telehealth: Payer: Self-pay | Admitting: Family

## 2012-12-10 LAB — CALCIUM, IONIZED: Calcium, Ion: 1.35 mmol/L — ABNORMAL HIGH (ref 1.12–1.32)

## 2012-12-10 LAB — PTH, INTACT AND CALCIUM: PTH: 101.2 pg/mL — ABNORMAL HIGH (ref 14.0–72.0)

## 2012-12-10 NOTE — Telephone Encounter (Signed)
Lm informing the pt that Dr. Magnus Ivan office placed her on their to call list, and that she should get a call by the 1st week of August. I also informed her that [place] from PT has been trying to reach her. i also typed her a letter giving her this same information to make sure that she receive this information....td

## 2012-12-10 NOTE — Telephone Encounter (Signed)
Notified Alona Bene, LPN at Dr. Wendie Chess office of patient's low potassium level (hypokalemia) of 3.2.

## 2012-12-12 ENCOUNTER — Telehealth: Payer: Self-pay | Admitting: Family

## 2012-12-12 NOTE — Telephone Encounter (Signed)
Explained to Mrs. Currington about elevated PTH and calcium level.  Explained that I would like to refer her to Dr. Darnell Level at [facility] for evaluation/recommendations.  The patient stated she understood and agreed with the care plan.  The patient states that she has had a parathyroid tumor removed previously.

## 2012-12-13 ENCOUNTER — Telehealth: Payer: Self-pay | Admitting: Oncology

## 2012-12-25 ENCOUNTER — Ambulatory Visit: Payer: Medicare Other | Attending: Family | Admitting: Physical Therapy

## 2012-12-25 DIAGNOSIS — M25519 Pain in unspecified shoulder: Secondary | ICD-10-CM | POA: Insufficient documentation

## 2012-12-25 DIAGNOSIS — C50919 Malignant neoplasm of unspecified site of unspecified female breast: Secondary | ICD-10-CM | POA: Insufficient documentation

## 2012-12-25 DIAGNOSIS — M24519 Contracture, unspecified shoulder: Secondary | ICD-10-CM | POA: Insufficient documentation

## 2012-12-25 DIAGNOSIS — IMO0001 Reserved for inherently not codable concepts without codable children: Secondary | ICD-10-CM | POA: Insufficient documentation

## 2012-12-27 ENCOUNTER — Other Ambulatory Visit: Payer: Self-pay | Admitting: Family Medicine

## 2012-12-27 DIAGNOSIS — R921 Mammographic calcification found on diagnostic imaging of breast: Secondary | ICD-10-CM

## 2012-12-30 ENCOUNTER — Encounter (INDEPENDENT_AMBULATORY_CARE_PROVIDER_SITE_OTHER): Payer: Medicare Other | Admitting: Surgery

## 2013-01-16 ENCOUNTER — Telehealth: Payer: Self-pay | Admitting: Family

## 2013-01-16 ENCOUNTER — Ambulatory Visit
Admission: RE | Admit: 2013-01-16 | Discharge: 2013-01-16 | Disposition: A | Discharge status: Home / Self Care | Payer: Medicare Other | Source: Ambulatory Visit | Attending: Family Medicine | Admitting: Family Medicine

## 2013-01-16 DIAGNOSIS — R921 Mammographic calcification found on diagnostic imaging of breast: Secondary | ICD-10-CM

## 2013-01-16 NOTE — Telephone Encounter (Signed)
Received written  message that [name] with Pain Rehabilitation at [phone] called and stated [name]'s injections consist of Triamcinolone 0.1% 20-40 cc's with Lidocaine.  The message also stated the patient is scheduled for additional injections and for [name] to advise if the injections were okay with [facility].  Left message for [name] that I'm not exactly sure who called her, but we have not.  As far as we are concerned, the injections are okay if the patient is tolerating them well.

## 2013-01-29 ENCOUNTER — Ambulatory Visit (INDEPENDENT_AMBULATORY_CARE_PROVIDER_SITE_OTHER): Payer: Medicare Other | Admitting: Surgery

## 2013-01-29 ENCOUNTER — Other Ambulatory Visit (INDEPENDENT_AMBULATORY_CARE_PROVIDER_SITE_OTHER): Payer: Self-pay | Admitting: Surgery

## 2013-01-29 ENCOUNTER — Encounter (HOSPITAL_COMMUNITY): Payer: Self-pay | Admitting: Pharmacy Technician

## 2013-01-29 ENCOUNTER — Encounter (INDEPENDENT_AMBULATORY_CARE_PROVIDER_SITE_OTHER): Payer: Self-pay | Admitting: Surgery

## 2013-01-29 DIAGNOSIS — E210 Primary hyperparathyroidism: Secondary | ICD-10-CM

## 2013-01-29 DIAGNOSIS — R921 Mammographic calcification found on diagnostic imaging of breast: Secondary | ICD-10-CM | POA: Insufficient documentation

## 2013-01-29 DIAGNOSIS — E0590 Thyrotoxicosis, unspecified without thyrotoxic crisis or storm: Secondary | ICD-10-CM

## 2013-01-29 NOTE — Progress Notes (Signed)
Subjective:     Patient ID: [name], female   DOB: 03-01-1946, 67 y.o.   MRN: [id number]  HPI This is a patient well-known to me with a previous history of left breast cancer. She now presents with abnormal calcifications in the right breast. Apparently, she weighs too much to fit on the stereotactic biopsy table.  She has also recently found to the hypercalcemic and have a elevated parathyroid hormone level. She was actually supposed to see Dr. Gerrit Friends next week for the parathyroid gland.  She has had no complaints regarding her breasts  Review of Systems     Objective:   Physical Exam On exam, there are no palpable breast masses and no axillary adenopathy. There are no neck masses    Assessment:     Abnormal consultations right breast Elevated parathyroid hormone level     Plan:     She will need a needle localized excision of the calcifications of the right breast  To rule out malignancy. I am going to also get a sestamibi scan to evaluate her parathyroid glands. Since I performed minimally invasive parathyroidectomy is as well, she reports she would to see me for this problem instead of Dr. Gerrit Friends. Pending the timing of the scan, I may be able to do both surgeries at one time.

## 2013-02-03 ENCOUNTER — Telehealth (INDEPENDENT_AMBULATORY_CARE_PROVIDER_SITE_OTHER): Payer: Self-pay | Admitting: General Surgery

## 2013-02-03 NOTE — Telephone Encounter (Signed)
This patient is aware that her NM Parathyroid scan with spect  Was cancelled due to [facility] has denied it . She will still be having breast surgery however the thyroid surgery  Has been postponed at this time per orders Dr Magnus Ivan

## 2013-02-04 ENCOUNTER — Encounter (HOSPITAL_COMMUNITY): Payer: Medicare Other

## 2013-02-05 ENCOUNTER — Encounter (HOSPITAL_COMMUNITY)
Admission: RE | Admit: 2013-02-05 | Discharge: 2013-02-05 | Disposition: A | Discharge status: Home / Self Care | Payer: Medicare Other | Source: Ambulatory Visit | Attending: Surgery | Admitting: Surgery

## 2013-02-05 ENCOUNTER — Encounter (HOSPITAL_COMMUNITY): Payer: Self-pay

## 2013-02-05 VITALS — BP 149/83 | HR 79 | Temp 98.7°F | Resp 20 | Ht 66.0 in | Wt 290.6 lb

## 2013-02-05 DIAGNOSIS — R921 Mammographic calcification found on diagnostic imaging of breast: Secondary | ICD-10-CM

## 2013-02-05 HISTORY — DX: Unspecified osteoarthritis, unspecified site: M19.90

## 2013-02-05 HISTORY — DX: Bronchitis, not specified as acute or chronic: J40

## 2013-02-05 LAB — BASIC METABOLIC PANEL
CO2: 30 mEq/L (ref 19–32)
Calcium: 10.2 mg/dL (ref 8.4–10.5)
Creatinine, Ser: 1.17 mg/dL — ABNORMAL HIGH (ref 0.50–1.10)
GFR calc non Af Amer: 46 mL/min — ABNORMAL LOW (ref >90)

## 2013-02-05 LAB — CBC
MCH: 26.4 pg (ref 26.0–34.0)
MCV: 81.3 fL (ref 78.0–100.0)
Platelets: 263 10*3/uL (ref 150–400)
RDW: 15.4 % (ref 11.5–15.5)
WBC: 9.3 10*3/uL (ref 4.0–10.5)

## 2013-02-05 MED ORDER — DEXTROSE 5 % IV SOLN
3.0000 g | INTRAVENOUS | Status: DC
  Filled 2013-02-05: qty 3000

## 2013-02-05 NOTE — Progress Notes (Signed)
Patient informed Nurse that she had a stress test > 5 years ago but denied having a cardiac cath. Patient has sleep apnea and wears a CPAP machine nightly. PCP is Dr. [name] at [facility] and Oncologist is Dr. Darnelle Catalan; encounters in [facility].

## 2013-02-05 NOTE — Pre-Procedure Instructions (Signed)
[name]  02/05/2013   Your procedure is scheduled on:  Thursday February 06, 2013.  Report to [facility] immediatly after leaving breast center. Come in through entrance "A" Take [place] Elevators to 3rd Floor Short Stay  Call this number if you have problems the morning of surgery: [phone]   Remember:   Do not eat food or drink liquids after midnight.   Take these medicines the morning of surgery with A SIP OF WATER: Bupropion (Wellbutrin), Hydrocodone if needed for pain, Tamoxifen (Nolvadex), and Verapamil (Verelan)  Discontinue aspirin, Plavix, and herbal medications 5 days prior to surgery    Do not wear jewelry, make-up or nail polish.  Do not wear lotions, powders, or perfumes. You may NOT wear deodorant.  Do not shave 48 hours prior to surgery.   Do not bring valuables to the hospital.  [facility] is not responsible for any belongings or valuables.  Contacts, dentures or bridgework may not be worn into surgery.  Leave suitcase in the car. After surgery it may be brought to your room.  For patients admitted to the hospital, checkout time is 11:00 AM the day of discharge.   Patients discharged the day of surgery will not be allowed to drive home.  Name and phone number of your driver: Family/Friend  Special Instructions:Shower using CHG tonight and tomorrow morning before surgery. Use special wash - you have one bottle of CHG for all showers.  You should use approximately 1/3 of the bottle for each shower.    Please read over the following fact sheets that you were given: Pain Booklet, Coughing and Deep Breathing and Surgical Site Infection Prevention

## 2013-02-05 NOTE — H&P (Signed)
[name] is an 67 y.o. female.   Chief Complaint: abnormal calcifications right breast HPI: this is a pleasant female with a previous history of left breast cancer who now presents with abnormal calcifications of the right breast found on mammography.  She was apparently too heavy for the steriotactic biopsy table so needle localized excisional biopsy is requested.   She is otherwise without complaints.  Past Medical History  Diagnosis Date  . OSA (obstructive sleep apnea)   . Hypertension   . Hyperlipidemia   . Headache(784.0)   . Blood transfusion   . Pneumonia 4/12  . Breast cancer     left breast  . Anemia   . Anxiety   . Depression     Dr.Lewit  . Diverticulosis   . Radiation 10/29/11-11/24/11    left breast 6100 cGy  . Tinnitus   . History of colon polyps 1998    adenomatous  . Spinal stenosis   . Bronchitis     hx of  . Shortness of breath     with exertion  . Arthritis     Past Surgical History  Procedure Laterality Date  . Total knee arthroplasty  2006    bilateral  . Polypectomy  1990's  . Appendectomy  1978  . Thyroid cyst excision  1994  . Transphenoidal / transnasal hypophysectomy / resection pituitary tumor  6/11  . Belpharoptosis repair    . Breast lumpectomy  08/2011    left  . Joint replacement Bilateral     bilateral knee  . Colonoscopy w/ polypectomy      Family History  Problem Relation Age of Onset  . Heart disease Father   . Clotting disorder Father   . Stroke Father   . Stroke Mother   . Diabetes Mother   . Colon cancer Neg Hx   . Cancer Sister 59    Breast Cancer   Social History:  reports that she has never smoked. She has never used smokeless tobacco. She reports that she does not drink alcohol or use illicit drugs.  Allergies:  Allergies  Allergen Reactions  . Cymbalta (Duloxetine Hcl)     Stomach pain  . Maxzide (Hydrochlorothiazide W-Triamterene) Other (See Comments)    Cramping   . Pravastatin Sodium Other (See  Comments)    Headache   . Lodine (Etodolac) Itching    No prescriptions prior to admission    Results for orders placed during the hospital encounter of 02/05/13 (from the past 48 hour(s))  BASIC METABOLIC PANEL     Status: Abnormal   Collection Time    02/05/13  9:37 AM      Result Value Range   Sodium 141  135 - 145 mEq/L   Potassium 3.3 (*) 3.5 - 5.1 mEq/L   Chloride 104  96 - 112 mEq/L   CO2 30  19 - 32 mEq/L   Glucose, Bld 101 (*) 70 - 99 mg/dL   BUN 20  6 - 23 mg/dL   Creatinine, Ser 2.13 (*) 0.50 - 1.10 mg/dL   Calcium 08.6  8.4 - 57.8 mg/dL   GFR calc non Af Amer 46 (*) >90 mL/min   GFR calc Af Amer 54 (*) >90 mL/min   Comment:            The eGFR has been calculated     using the CKD EPI equation.     This calculation has not been     validated in all clinical  situations.     eGFR's persistently     <90 mL/min signify     possible Chronic Kidney Disease.  CBC     Status: None   Collection Time    02/05/13  9:37 AM      Result Value Range   WBC 9.3  4.0 - 10.5 K/uL   RBC 4.54  3.87 - 5.11 MIL/uL   Hemoglobin 12.0  12.0 - 15.0 g/dL   HCT 16.1  09.6 - 04.5 %   MCV 81.3  78.0 - 100.0 fL   MCH 26.4  26.0 - 34.0 pg   MCHC 32.5  30.0 - 36.0 g/dL   RDW 40.9  81.1 - 91.4 %   Platelets 263  150 - 400 K/uL   Dg Chest 2 View  02/05/2013   *RADIOLOGY REPORT*  Clinical Data: Preop for breast lumpectomy, shortness of breath  CHEST - 2 VIEW  Comparison: Chest x-ray of 09/29/2011  Findings: No active infiltrate or effusion is seen.  Mediastinal contours appear normal.  There is mild peribronchial thickening which may indicate bronchitis.  The heart is mildly enlarged and stable.  There are degenerative changes throughout the thoracic spine.  IMPRESSION: No active lung disease.  Mild cardiomegaly.  Question bronchitis.   Original Report Authenticated By: Dwyane Dee, M.D.    Review of Systems  All other systems reviewed and are negative.    There were no vitals taken  for this visit. Physical Exam  Constitutional: [name], place, and time. No distress.  Morbidly obese  HENT:  Head: Normocephalic and atraumatic.  Right Ear: External ear normal.  Left Ear: External ear normal.  Nose: Nose normal.  Mouth/Throat: Oropharynx is clear and moist.  Eyes: Conjunctivae are normal. Pupils are equal, round, and reactive to light.  Neck: Normal range of motion. Neck supple. No tracheal deviation present.  Cardiovascular: Normal rate, regular rhythm and normal heart sounds.   Respiratory: Breath sounds normal. No respiratory distress.  GI: Soft. Bowel sounds are normal.  Musculoskeletal: Normal range of motion.  Neurological: She is alert and oriented to person, place, and time.  Skin: She is not diaphoretic.  Psychiatric: Her behavior is normal. Judgment normal.   right breast:  No palpable masses or adenopathy  Assessment/Plan Abnormal calcifications right breast  Needle localized right breast lumpectomy is recommended.  I discussed the risks which include but are not limited to bleeding, infection, need for further surgery, etc.  [name] A 02/05/2013, 9:40 PM

## 2013-02-06 ENCOUNTER — Encounter (HOSPITAL_COMMUNITY): Payer: Self-pay | Admitting: Vascular Surgery

## 2013-02-06 ENCOUNTER — Encounter (HOSPITAL_COMMUNITY)
Admission: RE | Disposition: A | Discharge status: Home / Self Care | Payer: Self-pay | Source: Ambulatory Visit | Attending: Surgery

## 2013-02-06 ENCOUNTER — Encounter (INDEPENDENT_AMBULATORY_CARE_PROVIDER_SITE_OTHER): Payer: Medicare Other | Admitting: Surgery

## 2013-02-06 ENCOUNTER — Ambulatory Visit (HOSPITAL_COMMUNITY): Payer: Medicare Other | Admitting: Certified Registered"

## 2013-02-06 ENCOUNTER — Ambulatory Visit
Admission: RE | Admit: 2013-02-06 | Discharge: 2013-02-06 | Disposition: A | Discharge status: Home / Self Care | Payer: Medicare Other | Source: Ambulatory Visit | Attending: Surgery | Admitting: Surgery

## 2013-02-06 ENCOUNTER — Encounter (HOSPITAL_COMMUNITY): Payer: Self-pay | Admitting: Certified Registered"

## 2013-02-06 ENCOUNTER — Ambulatory Visit (HOSPITAL_COMMUNITY)
Admission: RE | Admit: 2013-02-06 | Discharge: 2013-02-06 | Disposition: A | Discharge status: Home / Self Care | Payer: Medicare Other | Source: Ambulatory Visit | Attending: Surgery | Admitting: Surgery

## 2013-02-06 DIAGNOSIS — Z803 Family history of malignant neoplasm of breast: Secondary | ICD-10-CM | POA: Insufficient documentation

## 2013-02-06 DIAGNOSIS — Z853 Personal history of malignant neoplasm of breast: Secondary | ICD-10-CM | POA: Insufficient documentation

## 2013-02-06 DIAGNOSIS — R920 Mammographic microcalcification found on diagnostic imaging of breast: Secondary | ICD-10-CM | POA: Insufficient documentation

## 2013-02-06 DIAGNOSIS — F411 Generalized anxiety disorder: Secondary | ICD-10-CM | POA: Insufficient documentation

## 2013-02-06 DIAGNOSIS — R921 Mammographic calcification found on diagnostic imaging of breast: Secondary | ICD-10-CM

## 2013-02-06 DIAGNOSIS — I10 Essential (primary) hypertension: Secondary | ICD-10-CM | POA: Insufficient documentation

## 2013-02-06 DIAGNOSIS — Z923 Personal history of irradiation: Secondary | ICD-10-CM | POA: Insufficient documentation

## 2013-02-06 DIAGNOSIS — M129 Arthropathy, unspecified: Secondary | ICD-10-CM | POA: Insufficient documentation

## 2013-02-06 DIAGNOSIS — Z888 Allergy status to other drugs, medicaments and biological substances status: Secondary | ICD-10-CM | POA: Insufficient documentation

## 2013-02-06 DIAGNOSIS — F3289 Other specified depressive episodes: Secondary | ICD-10-CM | POA: Insufficient documentation

## 2013-02-06 DIAGNOSIS — G4733 Obstructive sleep apnea (adult) (pediatric): Secondary | ICD-10-CM | POA: Insufficient documentation

## 2013-02-06 DIAGNOSIS — D649 Anemia, unspecified: Secondary | ICD-10-CM | POA: Insufficient documentation

## 2013-02-06 DIAGNOSIS — E785 Hyperlipidemia, unspecified: Secondary | ICD-10-CM | POA: Insufficient documentation

## 2013-02-06 DIAGNOSIS — F329 Major depressive disorder, single episode, unspecified: Secondary | ICD-10-CM | POA: Insufficient documentation

## 2013-02-06 DIAGNOSIS — M4800 Spinal stenosis, site unspecified: Secondary | ICD-10-CM | POA: Insufficient documentation

## 2013-02-06 DIAGNOSIS — Z8701 Personal history of pneumonia (recurrent): Secondary | ICD-10-CM | POA: Insufficient documentation

## 2013-02-06 HISTORY — PX: BREAST EXCISIONAL BIOPSY: SUR124

## 2013-02-06 HISTORY — PX: BREAST LUMPECTOMY WITH NEEDLE LOCALIZATION: SHX5759

## 2013-02-06 SURGERY — BREAST LUMPECTOMY WITH NEEDLE LOCALIZATION
Anesthesia: General | Site: Breast | Laterality: Right | Wound class: Clean

## 2013-02-06 MED ORDER — LIDOCAINE HCL (CARDIAC) 20 MG/ML IV SOLN
INTRAVENOUS | Status: DC | PRN
  Administered 2013-02-06: 70 mg via INTRAVENOUS

## 2013-02-06 MED ORDER — ONDANSETRON HCL 4 MG/2ML IJ SOLN
INTRAMUSCULAR | Status: DC | PRN
  Administered 2013-02-06: 4 mg via INTRAVENOUS

## 2013-02-06 MED ORDER — LACTATED RINGERS IV SOLN
INTRAVENOUS | Status: DC
  Administered 2013-02-06: 10:00:00 via INTRAVENOUS

## 2013-02-06 MED ORDER — BUPIVACAINE-EPINEPHRINE PF 0.25-1:200000 % IJ SOLN
INTRAMUSCULAR | Status: AC
  Filled 2013-02-06: qty 30

## 2013-02-06 MED ORDER — 0.9 % SODIUM CHLORIDE (POUR BTL) OPTIME
TOPICAL | Status: DC | PRN
  Administered 2013-02-06: 1000 mL

## 2013-02-06 MED ORDER — PROPOFOL 10 MG/ML IV BOLUS
INTRAVENOUS | Status: DC | PRN
  Administered 2013-02-06: 200 mg via INTRAVENOUS

## 2013-02-06 MED ORDER — BUPIVACAINE-EPINEPHRINE 0.25% -1:200000 IJ SOLN
INTRAMUSCULAR | Status: DC | PRN
  Administered 2013-02-06: 20 mL

## 2013-02-06 MED ORDER — MIDAZOLAM HCL 5 MG/5ML IJ SOLN
INTRAMUSCULAR | Status: DC | PRN
  Administered 2013-02-06: 2 mg via INTRAVENOUS

## 2013-02-06 MED ORDER — FENTANYL CITRATE 0.05 MG/ML IJ SOLN
INTRAMUSCULAR | Status: DC | PRN
  Administered 2013-02-06: 25 ug via INTRAVENOUS
  Administered 2013-02-06: 75 ug via INTRAVENOUS
  Administered 2013-02-06: 25 ug via INTRAVENOUS

## 2013-02-06 SURGICAL SUPPLY — 49 items
APL SKNCLS STERI-STRIP NONHPOA (GAUZE/BANDAGES/DRESSINGS) ×1
BENZOIN TINCTURE PRP APPL 2/3 (GAUZE/BANDAGES/DRESSINGS) ×2 IMPLANT
BLADE SURG 10 STRL SS (BLADE) ×2 IMPLANT
BLADE SURG 15 STRL LF DISP TIS (BLADE) ×1 IMPLANT
BLADE SURG 15 STRL SS (BLADE) ×2
CANISTER SUCTION 2500CC (MISCELLANEOUS) ×1 IMPLANT
CHLORAPREP W/TINT 26ML (MISCELLANEOUS) ×2 IMPLANT
CLOTH BEACON ORANGE TIMEOUT ST (SAFETY) ×2 IMPLANT
COVER SURGICAL LIGHT HANDLE (MISCELLANEOUS) ×2 IMPLANT
DEVICE DUBIN SPECIMEN MAMMOGRA (MISCELLANEOUS) ×2 IMPLANT
DRAPE CHEST BREAST 15X10 FENES (DRAPES) ×2 IMPLANT
DRSG TEGADERM 4X4.75 (GAUZE/BANDAGES/DRESSINGS) ×2 IMPLANT
ELECT CAUTERY BLADE 6.4 (BLADE) ×2 IMPLANT
ELECT REM PT RETURN 9FT ADLT (ELECTROSURGICAL) ×2
ELECTRODE REM PT RTRN 9FT ADLT (ELECTROSURGICAL) ×1 IMPLANT
GLOVE BIO SURGEON STRL SZ 6 (GLOVE) ×1 IMPLANT
GLOVE BIO SURGEON STRL SZ 6.5 (GLOVE) ×1 IMPLANT
GLOVE BIO SURGEON STRL SZ7.5 (GLOVE) ×1 IMPLANT
GLOVE BIOGEL M 6.5 STRL (GLOVE) ×1 IMPLANT
GLOVE BIOGEL PI IND STRL 6.5 (GLOVE) IMPLANT
GLOVE BIOGEL PI IND STRL 7.5 (GLOVE) IMPLANT
GLOVE BIOGEL PI INDICATOR 6.5 (GLOVE) ×1
GLOVE BIOGEL PI INDICATOR 7.5 (GLOVE) ×1
GLOVE SURG SIGNA 7.5 PF LTX (GLOVE) ×2 IMPLANT
GOWN STRL NON-REIN LRG LVL3 (GOWN DISPOSABLE) ×4 IMPLANT
GOWN STRL REIN XL XLG (GOWN DISPOSABLE) ×2 IMPLANT
KIT BASIN OR (CUSTOM PROCEDURE TRAY) ×2 IMPLANT
KIT MARKER MARGIN INK (KITS) ×1 IMPLANT
KIT ROOM TURNOVER OR (KITS) ×2 IMPLANT
NDL HYPO 25GX1X1/2 BEV (NEEDLE) ×1 IMPLANT
NEEDLE HYPO 25GX1X1/2 BEV (NEEDLE) ×2 IMPLANT
NS IRRIG 1000ML POUR BTL (IV SOLUTION) ×2 IMPLANT
PACK SURGICAL SETUP 50X90 (CUSTOM PROCEDURE TRAY) ×2 IMPLANT
PAD ARMBOARD 7.5X6 YLW CONV (MISCELLANEOUS) ×2 IMPLANT
PENCIL BUTTON HOLSTER BLD 10FT (ELECTRODE) ×2 IMPLANT
SPONGE GAUZE 4X4 12PLY (GAUZE/BANDAGES/DRESSINGS) ×2 IMPLANT
SPONGE LAP 4X18 X RAY DECT (DISPOSABLE) ×2 IMPLANT
STRIP CLOSURE SKIN 1/2X4 (GAUZE/BANDAGES/DRESSINGS) ×2 IMPLANT
SUT MNCRL AB 4-0 PS2 18 (SUTURE) ×1 IMPLANT
SUT MON AB 4-0 PC3 18 (SUTURE) ×1 IMPLANT
SUT SILK 2 0 SH (SUTURE) IMPLANT
SUT VIC AB 3-0 SH 27 (SUTURE) ×2
SUT VIC AB 3-0 SH 27XBRD (SUTURE) ×1 IMPLANT
SYR BULB 3OZ (MISCELLANEOUS) ×2 IMPLANT
SYR CONTROL 10ML LL (SYRINGE) ×2 IMPLANT
TOWEL OR 17X24 6PK STRL BLUE (TOWEL DISPOSABLE) ×2 IMPLANT
TOWEL OR 17X26 10 PK STRL BLUE (TOWEL DISPOSABLE) ×2 IMPLANT
TUBE CONNECTING 12X1/4 (SUCTIONS) ×1 IMPLANT
YANKAUER SUCT BULB TIP NO VENT (SUCTIONS) ×1 IMPLANT

## 2013-02-06 NOTE — Anesthesia Procedure Notes (Signed)
Procedure Name: LMA Insertion Date/Time: 02/06/2013 10:01 AM Performed by: Jerilee Hoh Pre-anesthesia Checklist: Patient identified, Emergency Drugs available, Suction available and Patient being monitored Patient Re-evaluated:Patient Re-evaluated prior to inductionOxygen Delivery Method: Circle system utilized Preoxygenation: Pre-oxygenation with 100% oxygen Intubation Type: IV induction Ventilation: Mask ventilation without difficulty LMA: LMA with gastric port inserted LMA Size: 4.0 Number of attempts: 1 Placement Confirmation: positive ETCO2 and breath sounds checked- equal and bilateral Tube secured with: Tape Dental Injury: Teeth and Oropharynx as per pre-operative assessment

## 2013-02-06 NOTE — Interval H&P Note (Signed)
History and Physical Interval Note: no change in H and P  02/06/2013 9:08 AM  [name]  has presented today for surgery, with the diagnosis of calcifications right breast     The various methods of treatment have been discussed with the patient and family. After consideration of risks, benefits and other options for treatment, the patient has consented to  Procedure(s): BREAST LUMPECTOMY WITH NEEDLE LOCALIZATION (Right) as a surgical intervention .  The patient's history has been reviewed, patient examined, no change in status, stable for surgery.  I have reviewed the patient's chart and labs.  Questions were answered to the patient's satisfaction.     Irine Heminger A

## 2013-02-06 NOTE — Op Note (Signed)
NAMEASHLEN, KIGER NO.:  [id number]  MEDICAL RECORD NO.:  [id number]  LOCATION:  [facility]                         FACILITY:  [facility]  PHYSICIAN:  [name], M.D. DATE OF BIRTH:  02-May-1946  DATE OF PROCEDURE:  02/06/2013 DATE OF DISCHARGE:  02/06/2013                              OPERATIVE REPORT   PREOPERATIVE DIAGNOSIS:  Abnormal microcalcifications of a right breast.  POSTOPERATIVE DIAGNOSIS:  Abnormal microcalcifications of a right breast.  PROCEDURE:  Needle localized right breast lumpectomy.  SURGEON:  [name], M.D.  ANESTHESIA:  General and 0.5% Marcaine.  ESTIMATED BLOOD LOSS:  Minimal.  INDICATIONS:  This is a 67 year old female with a previous history of a left breast cancer.  She now presents with abnormal calcifications in the right breast.  Because of her weight, stereotactic biopsy could not be performed, therefore she is presenting for needle localized lumpectomy.  FINDINGS:  The patient was found to have the abnormal calcifications in the center of the lumpectomy specimen confirmed by the radiologist. This was sent to pathology for evaluation.  PROCEDURE IN DETAIL:  The patient was brought to the operating room, identified as Grant Ruts.  She had already had a localization wire placed in the right breast.  She was placed on the operating table and general anesthesia was induced.  Her right breast was then prepped and draped in usual sterile fashion.  I anesthetized the skin around the localization wire at 12 o'clock position of the breast with Marcaine.  I then made an elliptical incision with a scalpel.  I took this down to the breast tissue with electrocautery.  I then performed a wide lumpectomy going down to the chest wall with electrocautery.  Wide margins appeared to be achieved around the localization wire.  Once the specimen was completely removed, X-ray was performed and the calcifications were centered well  in the specimen with wide margins. The specimen was painted at all margins with marker paint and sent to pathology for evaluation.  I then irrigated the wound with saline. Hemostasis appeared to be achieved with cautery.  I closed subcutaneous tissue with interrupted 3-0 Vicryl sutures and closed the skin with a running 4-0 Monocryl.  Steri-Strips, gauze, and Tegaderm were then applied.  The patient tolerated the procedure well.  All the counts were correct at the end of the procedure.  The patient was then extubated in the operating room and taken in stable condition to the recovery room.     [name], M.D.     DB/MEDQ  D:  02/06/2013  T:  02/06/2013  Job:  [id number]

## 2013-02-06 NOTE — Op Note (Signed)
RIGHT BREAST LUMPECTOMY WITH NEEDLE LOCALIZATION  Procedure Note  [name] 02/06/2013   Pre-op Diagnosis: RIGHT BREAST CALCIFICATIONS     Post-op Diagnosis: same  Procedure(s): RIGHT BREAST LUMPECTOMY WITH NEEDLE LOCALIZATION  Surgeon(s): Shelly Rubenstein, MD  Anesthesia: General  Staff:  Circulator: Pauletta Browns, RN Scrub Person: Janeece Agee Pingue, CST  Estimated Blood Loss: Minimal                         [name]   Date: 02/06/2013  Time: 10:36 AM

## 2013-02-06 NOTE — Transfer of Care (Signed)
Immediate Anesthesia Transfer of Care Note  Patient: [name]  Procedure(s) Performed: Procedure(s): RIGHT BREAST LUMPECTOMY WITH NEEDLE LOCALIZATION (Right)  Patient Location: PACU  Anesthesia Type:General  Level of Consciousness: awake, alert , oriented and patient cooperative  Airway & Oxygen Therapy: Patient Spontanous Breathing and Patient connected to nasal cannula oxygen  Post-op Assessment: Report given to PACU RN, Post -op Vital signs reviewed and stable and Patient moving all extremities  Post vital signs: Reviewed and stable  Complications: No apparent anesthesia complications

## 2013-02-06 NOTE — Anesthesia Preprocedure Evaluation (Addendum)
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 Informed patient of risks of anesthesia in detail regarding vocal cord issues and sore throat. CE  Reviewed: Allergy & Precautions, H&P , NPO status , Patient's Chart, lab work & pertinent test results, reviewed documented beta blocker date and time   Airway Mallampati: II      Dental   Pulmonary shortness of breath and with exertion, sleep apnea and Continuous Positive Airway Pressure Ventilation , pneumonia -,  breath sounds clear to auscultation        Cardiovascular hypertension, Pt. on medications Rhythm:Regular Rate:Normal     Neuro/Psych  Headaches, Anxiety Depression    GI/Hepatic negative GI ROS, Neg liver ROS,   Endo/Other  negative endocrine ROS  Renal/GU negative Renal ROS     Musculoskeletal   Abdominal   Peds  Hematology negative hematology ROS (+)   Anesthesia Other Findings   Reproductive/Obstetrics                         Anesthesia Physical Anesthesia Plan  ASA: III  Anesthesia Plan: General   Post-op Pain Management:    Induction: Intravenous  Airway Management Planned: LMA  Additional Equipment:   Intra-op Plan:   Post-operative Plan: Extubation in OR  Informed Consent: I have reviewed the patients History and Physical, chart, labs and discussed the procedure including the risks, benefits and alternatives for the proposed anesthesia with the patient or authorized representative who has indicated his/her understanding and acceptance.   Dental advisory given  Plan Discussed with: Anesthesiologist, CRNA and Surgeon  Anesthesia Plan Comments:         Anesthesia Quick Evaluation

## 2013-02-06 NOTE — Anesthesia Postprocedure Evaluation (Signed)
  Anesthesia Post-op Note  Patient: [name]  Procedure(s) Performed: Procedure(s): RIGHT BREAST LUMPECTOMY WITH NEEDLE LOCALIZATION (Right)  Patient Location: PACU  Anesthesia Type:General  Level of Consciousness: awake  Airway and Oxygen Therapy: Patient Spontanous Breathing  Post-op Pain: mild  Post-op Assessment: Post-op Vital signs reviewed  Post-op Vital Signs: Reviewed  Complications: No apparent anesthesia complications

## 2013-02-06 NOTE — Progress Notes (Signed)
Dr.Edwards called for Post op note, states pt is ok to be dc'd home

## 2013-02-06 NOTE — Preoperative (Signed)
Beta Blockers   Reason not to administer Beta Blockers:Not Applicable 

## 2013-02-07 ENCOUNTER — Encounter (INDEPENDENT_AMBULATORY_CARE_PROVIDER_SITE_OTHER): Payer: Self-pay | Admitting: General Surgery

## 2013-02-07 ENCOUNTER — Encounter (HOSPITAL_COMMUNITY): Payer: Self-pay | Admitting: Surgery

## 2013-02-10 ENCOUNTER — Observation Stay (HOSPITAL_COMMUNITY)
Admission: EM | Admit: 2013-02-10 | Discharge: 2013-02-11 | Disposition: A | Discharge status: Home / Self Care | Payer: Medicare Other | Attending: Family Medicine | Admitting: Family Medicine

## 2013-02-10 ENCOUNTER — Encounter (HOSPITAL_COMMUNITY): Payer: Self-pay

## 2013-02-10 ENCOUNTER — Telehealth (INDEPENDENT_AMBULATORY_CARE_PROVIDER_SITE_OTHER): Payer: Self-pay | Admitting: General Surgery

## 2013-02-10 ENCOUNTER — Emergency Department (HOSPITAL_COMMUNITY): Payer: Medicare Other

## 2013-02-10 DIAGNOSIS — R0989 Other specified symptoms and signs involving the circulatory and respiratory systems: Principal | ICD-10-CM | POA: Insufficient documentation

## 2013-02-10 DIAGNOSIS — R921 Mammographic calcification found on diagnostic imaging of breast: Secondary | ICD-10-CM

## 2013-02-10 DIAGNOSIS — C50912 Malignant neoplasm of unspecified site of left female breast: Secondary | ICD-10-CM

## 2013-02-10 DIAGNOSIS — E785 Hyperlipidemia, unspecified: Secondary | ICD-10-CM | POA: Diagnosis present

## 2013-02-10 DIAGNOSIS — R0609 Other forms of dyspnea: Principal | ICD-10-CM | POA: Insufficient documentation

## 2013-02-10 DIAGNOSIS — I10 Essential (primary) hypertension: Secondary | ICD-10-CM | POA: Diagnosis present

## 2013-02-10 DIAGNOSIS — E210 Primary hyperparathyroidism: Secondary | ICD-10-CM

## 2013-02-10 DIAGNOSIS — E669 Obesity, unspecified: Secondary | ICD-10-CM | POA: Insufficient documentation

## 2013-02-10 DIAGNOSIS — Z79899 Other long term (current) drug therapy: Secondary | ICD-10-CM | POA: Insufficient documentation

## 2013-02-10 DIAGNOSIS — G4733 Obstructive sleep apnea (adult) (pediatric): Secondary | ICD-10-CM | POA: Diagnosis present

## 2013-02-10 DIAGNOSIS — J40 Bronchitis, not specified as acute or chronic: Secondary | ICD-10-CM

## 2013-02-10 DIAGNOSIS — Z96659 Presence of unspecified artificial knee joint: Secondary | ICD-10-CM | POA: Insufficient documentation

## 2013-02-10 DIAGNOSIS — R0600 Dyspnea, unspecified: Secondary | ICD-10-CM | POA: Diagnosis present

## 2013-02-10 DIAGNOSIS — E66813 Obesity, class 3: Secondary | ICD-10-CM | POA: Diagnosis present

## 2013-02-10 DIAGNOSIS — E872 Acidosis, unspecified: Secondary | ICD-10-CM | POA: Diagnosis present

## 2013-02-10 DIAGNOSIS — C50919 Malignant neoplasm of unspecified site of unspecified female breast: Secondary | ICD-10-CM | POA: Insufficient documentation

## 2013-02-10 DIAGNOSIS — R000 Tachycardia, unspecified: Secondary | ICD-10-CM

## 2013-02-10 DIAGNOSIS — Z923 Personal history of irradiation: Secondary | ICD-10-CM | POA: Insufficient documentation

## 2013-02-10 DIAGNOSIS — J209 Acute bronchitis, unspecified: Secondary | ICD-10-CM | POA: Diagnosis present

## 2013-02-10 DIAGNOSIS — IMO0001 Reserved for inherently not codable concepts without codable children: Secondary | ICD-10-CM | POA: Diagnosis present

## 2013-02-10 LAB — URINALYSIS W MICROSCOPIC + REFLEX CULTURE
Ketones, ur: NEGATIVE mg/dL
Nitrite: NEGATIVE
Protein, ur: NEGATIVE mg/dL

## 2013-02-10 LAB — CBC WITH DIFFERENTIAL/PLATELET
Basophils Absolute: 0 10*3/uL (ref 0.0–0.1)
Eosinophils Absolute: 0.2 10*3/uL (ref 0.0–0.7)
Eosinophils Relative: 2 % (ref 0–5)
MCH: 27.1 pg (ref 26.0–34.0)
MCHC: 33.6 g/dL (ref 30.0–36.0)
MCV: 80.6 fL (ref 78.0–100.0)
Platelets: 233 10*3/uL (ref 150–400)
RDW: 15.4 % (ref 11.5–15.5)

## 2013-02-10 LAB — BASIC METABOLIC PANEL
CO2: 25 mEq/L (ref 19–32)
Calcium: 10.5 mg/dL (ref 8.4–10.5)
Creatinine, Ser: 1.07 mg/dL (ref 0.50–1.10)
Glucose, Bld: 111 mg/dL — ABNORMAL HIGH (ref 70–99)

## 2013-02-10 LAB — LACTIC ACID, PLASMA: Lactic Acid, Venous: 3.4 mmol/L — ABNORMAL HIGH (ref 0.5–2.2)

## 2013-02-10 LAB — D-DIMER, QUANTITATIVE: D-Dimer, Quant: 2.82 ug/mL-FEU — ABNORMAL HIGH (ref 0.00–0.48)

## 2013-02-10 MED ORDER — IPRATROPIUM BROMIDE 0.02 % IN SOLN
0.5000 mg | Freq: Once | RESPIRATORY_TRACT | Status: AC
  Administered 2013-02-11: 0.5 mg via RESPIRATORY_TRACT
  Filled 2013-02-10: qty 2.5

## 2013-02-10 MED ORDER — IPRATROPIUM BROMIDE 0.02 % IN SOLN
0.5000 mg | Freq: Once | RESPIRATORY_TRACT | Status: AC
  Administered 2013-02-10: 0.5 mg via RESPIRATORY_TRACT
  Filled 2013-02-10: qty 2.5

## 2013-02-10 MED ORDER — ALBUTEROL SULFATE (5 MG/ML) 0.5% IN NEBU
5.0000 mg | INHALATION_SOLUTION | Freq: Once | RESPIRATORY_TRACT | Status: AC
  Administered 2013-02-10: 5 mg via RESPIRATORY_TRACT
  Filled 2013-02-10: qty 1

## 2013-02-10 MED ORDER — IOHEXOL 350 MG/ML SOLN
100.0000 mL | Freq: Once | INTRAVENOUS | Status: AC | PRN
  Administered 2013-02-10: 100 mL via INTRAVENOUS

## 2013-02-10 MED ORDER — ALBUTEROL SULFATE (5 MG/ML) 0.5% IN NEBU
5.0000 mg | INHALATION_SOLUTION | Freq: Once | RESPIRATORY_TRACT | Status: AC
  Administered 2013-02-11: 5 mg via RESPIRATORY_TRACT
  Filled 2013-02-10: qty 1

## 2013-02-10 NOTE — ED Notes (Signed)
Respiratory paged for breathing tx. 

## 2013-02-10 NOTE — ED Notes (Signed)
Pt presents from Urgent Care with c/o shortness of breath and cough. Pt was seen at Urgent Care yesterday and today and today they confirmed that she did have pneumonia. Pt says she has been short of breath and has had a cough since Saturday.

## 2013-02-10 NOTE — ED Provider Notes (Signed)
History    CSN: [id number] Arrival date & time 02/10/13  1751  First MD Initiated Contact with Patient 02/10/13 1911     Chief Complaint  Patient presents with  . Shortness of Breath  . Cough    HPI Pt was seen at 1925.  Per pt, c/o gradual onset and persistence of constant cough for the past 2 to 3 days. Has been associated with shortness of breath and wheezing. Pt states she was evaluated by Urgent Care yesterday and today and was told she "had pneumonia," rx augmentin without change. States the [facility] told her to come to the ED today for further eval. Denies fevers, no CP/palpitations, no abd pain, no N/V/D, no back pain, no rash.     Past Medical History  Diagnosis Date  . OSA (obstructive sleep apnea)   . Hypertension   . Hyperlipidemia   . Headache(784.0)   . Blood transfusion   . Pneumonia 4/12  . Anemia   . Anxiety   . Depression     Dr.Lewit  . Diverticulosis   . Radiation 10/29/11-11/24/11    left breast 6100 cGy  . Tinnitus   . History of colon polyps 1998    adenomatous  . Spinal stenosis   . Bronchitis     hx of  . Shortness of breath     with exertion  . Arthritis   . Breast cancer 2013    left breast   Past Surgical History  Procedure Laterality Date  . Total knee arthroplasty  2006    bilateral  . Polypectomy  1990's  . Appendectomy  1978  . Thyroid cyst excision  1994  . Transphenoidal / transnasal hypophysectomy / resection pituitary tumor  6/11  . Belpharoptosis repair    . Breast lumpectomy  08/2011    left  . Joint replacement Bilateral     bilateral knee  . Colonoscopy w/ polypectomy    . Breast lumpectomy with needle localization Right 02/06/2013    Procedure: RIGHT BREAST LUMPECTOMY WITH NEEDLE LOCALIZATION;  Surgeon: Shelly Rubenstein, MD;  Location: [facility] OR;  Service: General;  Laterality: Right;   Family History  Problem Relation Age of Onset  . Heart disease Father   . Clotting disorder Father   . Stroke Father   . Stroke Mother    . Diabetes Mother   . Colon cancer Neg Hx   . Cancer Sister 67    Breast Cancer   History  Substance Use Topics  . Smoking status: Never Smoker   . Smokeless tobacco: Never Used  . Alcohol Use: No    Review of Systems ROS: Statement: All systems negative except as marked or noted in the HPI; Constitutional: Negative for fever and chills. ; ; Eyes: Negative for eye pain, redness and discharge. ; ; ENMT: Negative for ear pain, hoarseness, nasal congestion, sinus pressure and sore throat. ; ; Cardiovascular: Negative for chest pain, palpitations, diaphoresis, and peripheral edema. ; ; Respiratory: +cough, SOB, wheezing. Negative for stridor. ; ; Gastrointestinal: Negative for nausea, vomiting, diarrhea, abdominal pain, blood in stool, hematemesis, jaundice and rectal bleeding. . ; ; Genitourinary: Negative for dysuria, flank pain and hematuria. ; ; Musculoskeletal: Negative for back pain and neck pain. Negative for swelling and trauma.; ; Skin: Negative for pruritus, rash, abrasions, blisters, bruising and skin lesion.; ; Neuro: Negative for headache, lightheadedness and neck stiffness. Negative for weakness, altered level of consciousness , altered mental status, extremity weakness, paresthesias, involuntary movement, seizure  and syncope.       Allergies  Cymbalta; Maxzide; Pravastatin sodium; and Lodine  Home Medications   Current Outpatient Rx  Name  Route  Sig  Dispense  Refill  . albuterol (PROVENTIL HFA;VENTOLIN HFA) 108 (90 BASE) MCG/ACT inhaler   Inhalation   Inhale 2 puffs into the lungs every 6 (six) hours as needed for wheezing.         [name] buPROPion (WELLBUTRIN XL) 150 MG 24 hr tablet   Oral   Take 150 mg by mouth daily.         . cholecalciferol (VITAMIN D) 1000 UNITS tablet   Oral   Take 1,000 Units by mouth daily.         . clopidogrel (PLAVIX) 75 MG tablet   Oral   Take 75 mg by mouth daily.           . fish oil-omega-3 fatty acids 1000 MG capsule    Oral   Take 1 capsule by mouth 2 (two) times daily.           [name] HYDROcodone-acetaminophen (NORCO) 10-325 MG per tablet   Oral   Take 1 tablet by mouth every 8 (eight) hours as needed. Pain.         [name] losartan-hydrochlorothiazide (HYZAAR) 100-25 MG per tablet   Oral   Take 1 tablet by mouth daily.           . niacin (NIASPAN) 500 MG CR tablet   Oral   Take 500 mg by mouth 2 (two) times daily.          [name] nystatin-triamcinolone (MYCOLOG II) cream   Topical   Apply 1 application topically 2 (two) times daily as needed (itching).          . simvastatin (ZOCOR) 40 MG tablet   Oral   Take 40 mg by mouth at bedtime.           . tamoxifen (NOLVADEX) 20 MG tablet   Oral   Take 1 tablet (20 mg total) by mouth daily.   90 tablet   8   . verapamil (VERELAN PM) 240 MG 24 hr capsule   Oral   Take 240 mg by mouth daily.           BP 185/87  Pulse 82  Temp(Src) 99.1 F (37.3 C) (Oral)  Resp 18  Ht 5\' 6"  (1.676 m)  Wt 292 lb (132.45 kg)  BMI 47.15 kg/m2  SpO2 99% Physical Exam 1930: Physical examination:  Nursing notes reviewed; Vital signs and O2 SAT reviewed;  Constitutional: Well developed, Well nourished, Well hydrated, In no acute distress; Head:  Normocephalic, atraumatic; Eyes: EOMI, PERRL, No scleral icterus; ENMT: Mouth and pharynx normal, Mucous membranes moist; Neck: Supple, Full range of motion, No lymphadenopathy; Cardiovascular: Regular rate and rhythm, No gallop; Respiratory: Breath sounds coarse & equal bilaterally, No wheezes.  Speaking full sentences with ease, +non-productive cough during exam. Normal respiratory effort/excursion; Chest: Nontender, Movement normal; Abdomen: Soft, Nontender, Nondistended, Normal bowel sounds; Genitourinary: No CVA tenderness; Extremities: Pulses normal, No tenderness, No edema, No calf edema or asymmetry.; Neuro: AA&Ox3, Major CN grossly intact.  Speech clear. No gross focal motor or sensory deficits in extremities.; Skin:  Color normal, Warm, Dry.   ED Course  Procedures     MDM  MDM Reviewed: previous chart, nursing note and vitals Reviewed previous: ECG and labs Interpretation: labs, ECG and x-ray    Date: 02/10/2013  Rate: 103  Rhythm:  sinus tachycardia  QRS Axis: left  Intervals: normal  ST/T Wave abnormalities: nonspecific ST/T changes  Conduction Disutrbances:none  Narrative Interpretation:   Old EKG Reviewed: changes noted; NS STTW changes compared to previous EKG dated 02/05/2013.   Results for orders placed during the hospital encounter of 02/10/13  BASIC METABOLIC PANEL      Result Value Range   Sodium 141  135 - 145 mEq/L   Potassium 3.8  3.5 - 5.1 mEq/L   Chloride 103  96 - 112 mEq/L   CO2 25  19 - 32 mEq/L   Glucose, Bld 111 (*) 70 - 99 mg/dL   BUN 11  6 - 23 mg/dL   Creatinine, Ser 9.60  0.50 - 1.10 mg/dL   Calcium 45.4  8.4 - 09.8 mg/dL   GFR calc non Af Amer 52 (*) >90 mL/min   GFR calc Af Amer 60 (*) >90 mL/min  CBC WITH DIFFERENTIAL      Result Value Range   WBC 11.0 (*) 4.0 - 10.5 K/uL   RBC 4.39  3.87 - 5.11 MIL/uL   Hemoglobin 11.9 (*) 12.0 - 15.0 g/dL   HCT 11.9 (*) 14.7 - 82.9 %   MCV 80.6  78.0 - 100.0 fL   MCH 27.1  26.0 - 34.0 pg   MCHC 33.6  30.0 - 36.0 g/dL   RDW 56.2  13.0 - 86.5 %   Platelets 233  150 - 400 K/uL   Neutrophils Relative % 75  43 - 77 %   Neutro Abs 8.3 (*) 1.7 - 7.7 K/uL   Lymphocytes Relative 17  12 - 46 %   Lymphs Abs 1.9  0.7 - 4.0 K/uL   Monocytes Relative 6  3 - 12 %   Monocytes Absolute 0.7  0.1 - 1.0 K/uL   Eosinophils Relative 2  0 - 5 %   Eosinophils Absolute 0.2  0.0 - 0.7 K/uL   Basophils Relative 0  0 - 1 %   Basophils Absolute 0.0  0.0 - 0.1 K/uL  URINALYSIS W MICROSCOPIC + REFLEX CULTURE      Result Value Range   Color, Urine YELLOW  YELLOW   APPearance CLEAR  CLEAR   Specific Gravity, Urine 1.017  1.005 - 1.030   pH 5.5  5.0 - 8.0   Glucose, UA NEGATIVE  NEGATIVE mg/dL   Hgb urine dipstick NEGATIVE  NEGATIVE    Bilirubin Urine NEGATIVE  NEGATIVE   Ketones, ur NEGATIVE  NEGATIVE mg/dL   Protein, ur NEGATIVE  NEGATIVE mg/dL   Urobilinogen, UA 0.2  0.0 - 1.0 mg/dL   Nitrite NEGATIVE  NEGATIVE   Leukocytes, UA MODERATE (*) NEGATIVE   WBC, UA 3-6  <3 WBC/hpf   Squamous Epithelial / LPF FEW (*) RARE  TROPONIN I      Result Value Range   Troponin I <0.30  <0.30 ng/mL  LACTIC ACID, PLASMA      Result Value Range   Lactic Acid, Venous 3.4 (*) 0.5 - 2.2 mmol/L  PRO B NATRIURETIC PEPTIDE      Result Value Range   Pro B Natriuretic peptide (BNP) 112.6  0 - 125 pg/mL  D-DIMER, QUANTITATIVE      Result Value Range   D-Dimer, Quant 2.82 (*) 0.00 - 0.48 ug/mL-FEU   Dg Chest 2 View 02/10/2013   *RADIOLOGY REPORT*  Clinical Data: Cough, shortness of breath.  CHEST - 2 VIEW  Comparison: 02/05/2013  Findings: Stable cardiomegaly. Low lung volumes with  some mild subsegmental atelectasis in both lower lobes.  No focal airspace consolidation or overt edema.  No effusion.  Minimal spurring in the lower thoracic spine.  IMPRESSION: Low volumes.  No acute disease.   Original Report Authenticated By: D. Hassell III, MD   Ct Angio Chest Pe W/cm &/or Wo Cm 02/10/2013   *RADIOLOGY REPORT*  Clinical Data: Shortness of breath and elevated D-dimer. Recent of right breast surgery.  CT ANGIOGRAPHY CHEST  Technique:  Multidetector CT imaging of the chest using the standard protocol during bolus administration of intravenous contrast. Multiplanar reconstructed images including MIPs were obtained and reviewed to evaluate the vascular anatomy.  Contrast: OMNIPAQUE IOHEXOL 350 MG/ML SOLN  Comparison: 09/15/2011  Findings: No evidence for an aortic dissection.  There is a poor opacification of the pulmonary arteries.  No evidence for a large or central pulmonary embolism.  Limited evaluation of the lobar and more distal pulmonary arteries.  There is an air-fluid collection in the right breast consistent with recent surgery.  The  thyroid tissue is asymmetric and the right thyroid lobe may be hypoplastic. No significant chest lymphadenopathy.  There may be a gallstone. Otherwise, images of the upper abdomen are unremarkable.  The trachea and mainstem bronchi are patent.  There is atelectasis in the dependent aspects of both lungs.  No significant consolidation or airspace disease.  No acute bony abnormality.  IMPRESSION: Limited evaluation for pulmonary emboli due to poor contrast opacification of the pulmonary arteries.  There is no evidence for large or central pulmonary embolism.  Evaluation of the lobar and more distal vessels is limited.  Atelectasis in the lungs without focal consolidation.  Postsurgical changes in the right breast.   Original Report Authenticated By: Richarda Overlie, M.D.     Suzy.Karvonen:  Pt given neb with partial improvement in SOB and cough. Continues tachycardic and tachypneic, though Sats remain 99% R/A. No large PE on CT-A chest. No pneumonia on CXR. Will continue to treat symptomatically.  Dx and testing d/w pt.  Questions answered.  Verb understanding, agreeable to d/c admit. T/C to [facility]. Lendell Caprice, case discussed, including:  HPI, pertinent PM/SHx, VS/PE, dx testing, ED course and treatment:  Agreeable to observation admit, requests to write temporary orders, obtain medical bed to team 8.      Laray Anger, DO 02/12/13 1526

## 2013-02-10 NOTE — ED Notes (Signed)
Ambulated pt to bathroom and back to room.  Pt denies dizziness, or problems while ambulating. O2 96%, pulse 114

## 2013-02-10 NOTE — Telephone Encounter (Signed)
Patient called for pathology report from surgery on 02/06/2013. She was made aware Dr Magnus Ivan would need to review to go over this in detail but it did show no malignancy. She expressed appreciation. She is also having trouble with a cough. She saw her PCP yesterday who prescribed antibiotics but she is still coughing and worried about pneumonia. I made her aware that if it persists to see her PCP back. She will call with any other questions.

## 2013-02-11 DIAGNOSIS — E785 Hyperlipidemia, unspecified: Secondary | ICD-10-CM

## 2013-02-11 DIAGNOSIS — J40 Bronchitis, not specified as acute or chronic: Secondary | ICD-10-CM

## 2013-02-11 DIAGNOSIS — R0600 Dyspnea, unspecified: Secondary | ICD-10-CM | POA: Diagnosis present

## 2013-02-11 DIAGNOSIS — G4733 Obstructive sleep apnea (adult) (pediatric): Secondary | ICD-10-CM

## 2013-02-11 DIAGNOSIS — E66813 Obesity, class 3: Secondary | ICD-10-CM | POA: Diagnosis present

## 2013-02-11 DIAGNOSIS — J209 Acute bronchitis, unspecified: Secondary | ICD-10-CM

## 2013-02-11 DIAGNOSIS — E669 Obesity, unspecified: Secondary | ICD-10-CM

## 2013-02-11 DIAGNOSIS — IMO0001 Reserved for inherently not codable concepts without codable children: Secondary | ICD-10-CM | POA: Diagnosis present

## 2013-02-11 DIAGNOSIS — I10 Essential (primary) hypertension: Secondary | ICD-10-CM

## 2013-02-11 DIAGNOSIS — E872 Acidosis, unspecified: Secondary | ICD-10-CM

## 2013-02-11 DIAGNOSIS — R0989 Other specified symptoms and signs involving the circulatory and respiratory systems: Secondary | ICD-10-CM

## 2013-02-11 DIAGNOSIS — R0609 Other forms of dyspnea: Principal | ICD-10-CM

## 2013-02-11 LAB — LACTIC ACID, PLASMA: Lactic Acid, Venous: 2.1 mmol/L (ref 0.5–2.2)

## 2013-02-11 MED ORDER — ONDANSETRON HCL 4 MG/2ML IJ SOLN: 4.0000 mg | Freq: Four times a day (QID) | INTRAMUSCULAR | Status: DC | PRN

## 2013-02-11 MED ORDER — ALBUTEROL SULFATE (5 MG/ML) 0.5% IN NEBU: 2.5000 mg | INHALATION_SOLUTION | Freq: Four times a day (QID) | RESPIRATORY_TRACT | Status: DC

## 2013-02-11 MED ORDER — VITAMIN D3 25 MCG (1000 UNIT) PO TABS
1000.0000 [IU] | ORAL_TABLET | Freq: Every day | ORAL | Status: DC
  Administered 2013-02-11: 1000 [IU] via ORAL
  Filled 2013-02-11: qty 1

## 2013-02-11 MED ORDER — LOSARTAN POTASSIUM-HCTZ 100-25 MG PO TABS: 1.0000 | ORAL_TABLET | Freq: Every day | ORAL | Status: DC

## 2013-02-11 MED ORDER — LEVOFLOXACIN 500 MG PO TABS: 500.0000 mg | ORAL_TABLET | Freq: Every day | ORAL | Status: DC

## 2013-02-11 MED ORDER — POTASSIUM CHLORIDE IN NACL 20-0.9 MEQ/L-% IV SOLN
INTRAVENOUS | Status: DC
  Administered 2013-02-11: 01:00:00 via INTRAVENOUS
  Filled 2013-02-11 (×2): qty 1000

## 2013-02-11 MED ORDER — NIACIN ER (ANTIHYPERLIPIDEMIC) 500 MG PO TBCR
500.0000 mg | EXTENDED_RELEASE_TABLET | Freq: Two times a day (BID) | ORAL | Status: DC
  Administered 2013-02-11 (×2): 500 mg via ORAL
  Filled 2013-02-11 (×3): qty 1

## 2013-02-11 MED ORDER — ATORVASTATIN CALCIUM 20 MG PO TABS
20.0000 mg | ORAL_TABLET | Freq: Every day | ORAL | Status: DC
  Filled 2013-02-11: qty 1

## 2013-02-11 MED ORDER — BUPROPION HCL ER (XL) 150 MG PO TB24
150.0000 mg | ORAL_TABLET | Freq: Every day | ORAL | Status: DC
  Administered 2013-02-11: 150 mg via ORAL
  Filled 2013-02-11: qty 1

## 2013-02-11 MED ORDER — LOSARTAN POTASSIUM 50 MG PO TABS
100.0000 mg | ORAL_TABLET | Freq: Every day | ORAL | Status: DC
  Administered 2013-02-11: 100 mg via ORAL
  Filled 2013-02-11: qty 2

## 2013-02-11 MED ORDER — VERAPAMIL HCL ER 240 MG PO TBCR
240.0000 mg | EXTENDED_RELEASE_TABLET | Freq: Every day | ORAL | Status: DC
  Administered 2013-02-11: 240 mg via ORAL
  Filled 2013-02-11: qty 1

## 2013-02-11 MED ORDER — TAMOXIFEN CITRATE 10 MG PO TABS
20.0000 mg | ORAL_TABLET | Freq: Every day | ORAL | Status: DC
  Administered 2013-02-11: 20 mg via ORAL
  Filled 2013-02-11: qty 2

## 2013-02-11 MED ORDER — CLOPIDOGREL BISULFATE 75 MG PO TABS
75.0000 mg | ORAL_TABLET | Freq: Every day | ORAL | Status: DC
  Administered 2013-02-11: 75 mg via ORAL
  Filled 2013-02-11: qty 1

## 2013-02-11 MED ORDER — ACETAMINOPHEN 650 MG RE SUPP: 650.0000 mg | Freq: Four times a day (QID) | RECTAL | Status: DC | PRN

## 2013-02-11 MED ORDER — SODIUM CHLORIDE 0.9 % IV SOLN: INTRAVENOUS | Status: DC

## 2013-02-11 MED ORDER — ALBUTEROL SULFATE (5 MG/ML) 0.5% IN NEBU: 2.5000 mg | INHALATION_SOLUTION | Freq: Four times a day (QID) | RESPIRATORY_TRACT | Status: DC | PRN

## 2013-02-11 MED ORDER — SENNOSIDES-DOCUSATE SODIUM 8.6-50 MG PO TABS
1.0000 | ORAL_TABLET | Freq: Every evening | ORAL | Status: DC | PRN
  Filled 2013-02-11: qty 1

## 2013-02-11 MED ORDER — SIMVASTATIN 40 MG PO TABS
40.0000 mg | ORAL_TABLET | Freq: Every day | ORAL | Status: DC
  Administered 2013-02-11: 40 mg via ORAL
  Filled 2013-02-11 (×2): qty 1

## 2013-02-11 MED ORDER — HYDROCHLOROTHIAZIDE 25 MG PO TABS
25.0000 mg | ORAL_TABLET | Freq: Every day | ORAL | Status: DC
  Administered 2013-02-11: 25 mg via ORAL
  Filled 2013-02-11: qty 1

## 2013-02-11 MED ORDER — ALBUTEROL SULFATE (5 MG/ML) 0.5% IN NEBU
2.5000 mg | INHALATION_SOLUTION | Freq: Two times a day (BID) | RESPIRATORY_TRACT | Status: DC
  Administered 2013-02-11: 2.5 mg via RESPIRATORY_TRACT
  Filled 2013-02-11: qty 0.5

## 2013-02-11 MED ORDER — DOCUSATE SODIUM 100 MG PO CAPS
100.0000 mg | ORAL_CAPSULE | Freq: Two times a day (BID) | ORAL | Status: DC
  Administered 2013-02-11: 100 mg via ORAL
  Filled 2013-02-11 (×3): qty 1

## 2013-02-11 MED ORDER — ALUM & MAG HYDROXIDE-SIMETH 200-200-20 MG/5ML PO SUSP: 30.0000 mL | Freq: Four times a day (QID) | ORAL | Status: DC | PRN

## 2013-02-11 MED ORDER — ALBUTEROL SULFATE (5 MG/ML) 0.5% IN NEBU: 2.5000 mg | INHALATION_SOLUTION | RESPIRATORY_TRACT | Status: DC

## 2013-02-11 MED ORDER — ENOXAPARIN SODIUM 60 MG/0.6ML ~~LOC~~ SOLN
60.0000 mg | SUBCUTANEOUS | Status: DC
  Administered 2013-02-11: 60 mg via SUBCUTANEOUS
  Filled 2013-02-11: qty 0.6

## 2013-02-11 MED ORDER — LEVOFLOXACIN 500 MG PO TABS
500.0000 mg | ORAL_TABLET | Freq: Every day | ORAL | Status: DC
  Administered 2013-02-11: 500 mg via ORAL
  Filled 2013-02-11: qty 1

## 2013-02-11 MED ORDER — ACETAMINOPHEN 325 MG PO TABS: 650.0000 mg | ORAL_TABLET | Freq: Four times a day (QID) | ORAL | Status: DC | PRN

## 2013-02-11 MED ORDER — HYDROCODONE-ACETAMINOPHEN 10-325 MG PO TABS
1.0000 | ORAL_TABLET | Freq: Three times a day (TID) | ORAL | Status: DC | PRN
  Administered 2013-02-11: 1 via ORAL
  Filled 2013-02-11: qty 1

## 2013-02-11 MED ORDER — ONDANSETRON HCL 4 MG PO TABS: 4.0000 mg | ORAL_TABLET | Freq: Four times a day (QID) | ORAL | Status: DC | PRN

## 2013-02-11 MED ORDER — TRAZODONE HCL 50 MG PO TABS
50.0000 mg | ORAL_TABLET | Freq: Every evening | ORAL | Status: DC | PRN
  Administered 2013-02-11: 50 mg via ORAL
  Filled 2013-02-11: qty 1

## 2013-02-11 MED ORDER — DM-GUAIFENESIN ER 30-600 MG PO TB12
1.0000 | ORAL_TABLET | Freq: Two times a day (BID) | ORAL | Status: DC
  Administered 2013-02-11 (×2): 1 via ORAL
  Filled 2013-02-11 (×3): qty 1

## 2013-02-11 NOTE — Progress Notes (Signed)
Pt states that she can drive herself home,that she drove herself here.   Pt alert and orient, able to ambulate in halls.

## 2013-02-11 NOTE — ED Notes (Signed)
Resp at bedside administering breathing tx

## 2013-02-11 NOTE — Discharge Summary (Signed)
Physician Discharge Summary  [name] AVW:[id number] DOB: [birth year] DOA: 02/10/2013  PCP: [name] Blamer, MD  Admit date: 02/10/2013 Discharge date: 02/11/2013  Time spent: > 35 minutes  Recommendations for Outpatient Follow-up:  1. Please be sure to follow up on patient's respiratory status post discharge and decide whether or not patient will require a more prolonged antibiotic course.  Discharge Diagnoses:  Principal Problem:   Dyspnea Active Problems:   HYPERLIPIDEMIA   HYPERTENSION   OSA (obstructive sleep apnea)   Breast cancer, left breast   Lactic acidosis   Acute bronchitis   Obesity   Discharge Condition: Stabe   Diet recommendation: Low sodium heart healthy  Filed Weights   02/10/13 1908 02/11/13 0100  Weight: 132.45 kg (292 lb) 130 kg (286 lb 9.6 oz)    History of present illness:  From original HPI:  [name] is a 67 y.o. female who presents to the emergency room with cough shortness of breath chest congestion and wheezing for several days. She'll low grade fever of 99.5 when seen in the walk-in clinic last night. She was reportedly prescribed Augmentin. She had a rough night and attempted to make an appointment with her primary care provider. She was unable, so went to an urgent care center. She was reportedly told she had pneumonia. In the emergency room, CT angiogram of the chest is a poor study for PE, but no evidence of infiltrate. She has a temperature of 99.4. Heart rate ranging 80-115. Respiratory rate 18 normal oxygen saturations. Venous lactic acid is 3.4. The ED physician has recommended admission, but the patient apparently had planned on leaving AMA but subsequently changed her mind. She received bronchodilators in the emergency room and currently her wheezing is better. Her cough is somewhat improved. Her appetite has been good without vomiting or diarrhea. She reports having had several previous bouts of pneumonia requiring  hospitalization. She does not smoke. Rarely, she uses an inhaler at home and suffering from a respiratory illness. She used this yesterday with some relief.  Hospital Course:  Dyspnea:  - improved today - albuterol prn wheeze or Sob, discussed with patient proper way to use medication - Most likely from acute bronchitis.  Will treat with levaquin for a total of 5 days - CT angiogram of chest negative for PE  HYPERLIPIDEMIA  - stable, continue home regimen.  HYPERTENSION  - Will plan on continuing home regimen  OSA (obstructive sleep apnea)  -Pt to continue CPAP at home  Breast cancer, left breast. Most recent lumpectomy negative for malignancy   Lactic acidosis:  -Resolved on repeat testing  - patient afebrile and does not meet sepsis criteria  Acute bronchitis:  - No evidence of pneumonia. Will continue Levaquin on d/c   Procedures:  CT angiogram of chest  Consultations:  none  Discharge Exam: Filed Vitals:   02/11/13 0100 02/11/13 0609 02/11/13 0613 02/11/13 0936  BP: 147/86 106/58 119/61   Pulse: 106 81    Temp: 98.9 F (37.2 C) 98.1 F (36.7 C)    TempSrc: Oral Oral    Resp: 20 20    Height: 5\' 6"  (1.676 m)     Weight: 130 kg (286 lb 9.6 oz)     SpO2: 99% 98%  97%    General: Pt in NAD, Alert and awake Cardiovascular: RRR, no MRG Respiratory: CTA BL, no wheezes  Discharge Instructions  Discharge Orders   Future Appointments Provider Department Dept Phone   02/25/2013 11:50 AM [name] Rubenstein,  MD [facility], [place] [phone]   06/12/2013 10:00 AM [name] [facility] MEDICAL ONCOLOGY [phone]   06/12/2013 10:30 AM [name] Dell, MD [facility] MEDICAL ONCOLOGY [phone]   Future Orders Complete By Expires     Call MD for:  difficulty breathing, headache or visual disturbances  As directed     Call MD for:  redness, tenderness, or signs of infection (pain, swelling, redness, odor or  green/yellow discharge around incision site)  As directed     Call MD for:  severe uncontrolled pain  As directed     Call MD for:  temperature >100.4  As directed     Diet - low sodium heart healthy  As directed     Discharge instructions  As directed     Comments:      Please follow up with your primary care physician in 1-2 weeks or sooner should any new concerns arise.    Increase activity slowly  As directed         Medication List         albuterol 108 (90 BASE) MCG/ACT inhaler  Commonly known as:  PROVENTIL HFA;VENTOLIN HFA  Inhale 2 puffs into the lungs every 6 (six) hours as needed for wheezing.     buPROPion 150 MG 24 hr tablet  Commonly known as:  WELLBUTRIN XL  Take 150 mg by mouth daily.     cholecalciferol 1000 UNITS tablet  Commonly known as:  VITAMIN D  Take 1,000 Units by mouth daily.     clopidogrel 75 MG tablet  Commonly known as:  PLAVIX  Take 75 mg by mouth daily.     fish oil-omega-3 fatty acids 1000 MG capsule  Take 1 capsule by mouth 2 (two) times daily.     HYDROcodone-acetaminophen 10-325 MG per tablet  Commonly known as:  NORCO  Take 1 tablet by mouth every 8 (eight) hours as needed. Pain.     levofloxacin 500 MG tablet  Commonly known as:  LEVAQUIN  Take 1 tablet (500 mg total) by mouth daily.     losartan-hydrochlorothiazide 100-25 MG per tablet  Commonly known as:  HYZAAR  Take 1 tablet by mouth daily.     niacin 500 MG CR tablet  Commonly known as:  NIASPAN  Take 500 mg by mouth 2 (two) times daily.     nystatin-triamcinolone cream  Commonly known as:  MYCOLOG II  Apply 1 application topically 2 (two) times daily as needed (itching).     simvastatin 40 MG tablet  Commonly known as:  ZOCOR  Take 40 mg by mouth at bedtime.     tamoxifen 20 MG tablet  Commonly known as:  NOLVADEX  Take 1 tablet (20 mg total) by mouth daily.     verapamil 240 MG 24 hr capsule  Commonly known as:  VERELAN PM  Take 240 mg by mouth daily.        Allergies  Allergen Reactions  . Cymbalta (Duloxetine Hcl)     Stomach pain  . Maxzide (Hydrochlorothiazide W-Triamterene) Other (See Comments)    Cramping   . Pravastatin Sodium Other (See Comments)    Headache   . Lodine (Etodolac) Itching      The results of significant diagnostics from this hospitalization (including imaging, microbiology, ancillary and laboratory) are listed below for reference.    Significant Diagnostic Studies: Dg Chest 2 View  02/10/2013   *RADIOLOGY REPORT*  Clinical Data: Cough,  shortness of breath.  CHEST - 2 VIEW  Comparison: 02/05/2013  Findings: Stable cardiomegaly. Low lung volumes with some mild subsegmental atelectasis in both lower lobes.  No focal airspace consolidation or overt edema.  No effusion.  Minimal spurring in the lower thoracic spine.  IMPRESSION: Low volumes.  No acute disease.   Original Report Authenticated By: D. Andria Rhein, MD   Dg Chest 2 View  02/05/2013   *RADIOLOGY REPORT*  Clinical Data: Preop for breast lumpectomy, shortness of breath  CHEST - 2 VIEW  Comparison: Chest x-ray of 09/29/2011  Findings: No active infiltrate or effusion is seen.  Mediastinal contours appear normal.  There is mild peribronchial thickening which may indicate bronchitis.  The heart is mildly enlarged and stable.  There are degenerative changes throughout the thoracic spine.  IMPRESSION: No active lung disease.  Mild cardiomegaly.  Question bronchitis.   Original Report Authenticated By: Dwyane Dee, M.D.   Ct Angio Chest Pe W/cm &/or Wo Cm  02/10/2013   *RADIOLOGY REPORT*  Clinical Data: Shortness of breath and elevated D-dimer. Recent of right breast surgery.  CT ANGIOGRAPHY CHEST  Technique:  Multidetector CT imaging of the chest using the standard protocol during bolus administration of intravenous contrast. Multiplanar reconstructed images including MIPs were obtained and reviewed to evaluate the vascular anatomy.  Contrast: OMNIPAQUE IOHEXOL 350  MG/ML SOLN  Comparison: 09/15/2011  Findings: No evidence for an aortic dissection.  There is a poor opacification of the pulmonary arteries.  No evidence for a large or central pulmonary embolism.  Limited evaluation of the lobar and more distal pulmonary arteries.  There is an air-fluid collection in the right breast consistent with recent surgery.  The thyroid tissue is asymmetric and the right thyroid lobe may be hypoplastic. No significant chest lymphadenopathy.  There may be a gallstone. Otherwise, images of the upper abdomen are unremarkable.  The trachea and mainstem bronchi are patent.  There is atelectasis in the dependent aspects of both lungs.  No significant consolidation or airspace disease.  No acute bony abnormality.  IMPRESSION: Limited evaluation for pulmonary emboli due to poor contrast opacification of the pulmonary arteries.  There is no evidence for large or central pulmonary embolism.  Evaluation of the lobar and more distal vessels is limited.  Atelectasis in the lungs without focal consolidation.  Postsurgical changes in the right breast.   Original Report Authenticated By: Richarda Overlie, M.D.   Mm Digital Diagnostic Unilat R  01/16/2013   *RADIOLOGY REPORT*  Clinical Data:  25-month reevaluation of calcifications in the 12 o'clock position of the right breast.  The patient exceeds the weight limits for the stereotactic biopsy table.  DIGITAL DIAGNOSTIC RIGHT MAMMOGRAM WITH CAD  Comparison: 07/25/2012, 07/15/2012, 07/13/2011, 07/06/2010  Findings:  ACR Breast Density Category 2: There is a scattered fibroglandular pattern.  There is a cluster of pleomorphic calcifications at the middle third depth in the 12 o'clock position of the right breast.  These have increased in number when compared to the prior study. The cluster measures 6 mm in greatest diameter.  Mammographic images were processed with CAD.  IMPRESSION: Suspicious right breast calcifications  BI-RADS CATEGORY 4:  Suspicious  abnormality - biopsy should be considered.  RECOMMENDATION: Surgical excisional biopsy of the calcifications described above. The patient was given an appointment made on her behalf to meet with Dr. [name] to discuss excisional biopsy.  I have discussed the findings and recommendations with the patient. Results were also provided in writing at  the conclusion of the visit.  If applicable, a reminder letter will be sent to the patient regarding her next appointment.   Original Report Authenticated By: Esperanza Heir, M.D.   Mm Rt Plc Breast Loc Dev   1st Lesion  Inc Mammo Guide  02/06/2013   *RADIOLOGY REPORT*  Clinical Data:  Patient presents for needle localization and surgical excision of a group of indeterminate microcalcifications over the slightly inner mid to upper right breast.  NEEDLE LOCALIZATION WITH MAMMOGRAPHIC GUIDANCE AND SPECIMEN RADIOGRAPH  Comparison:  Previous exams.  Patient presents for needle localization prior to surgical excisional biopsy.  I met with the patient and we discussed the procedure of needle localization including benefits and alternatives. We discussed the high likelihood of a successful procedure. We discussed the risks of the procedure, including infection, bleeding, tissue injury, and further surgery. Informed, written consent was given. The usual time-out protocol was performed immediately prior to the procedure.  Using mammographic guidance, sterile technique, 2% lidocaine and a 9 cm modified Kopans needle, the targeted group of microcalcifications was localized using a superior to inferior approach.  The films are marked for Dr. Magnus Ivan.  Specimen radiograph was performed at , and confirms the targeted group of microcalcifications present in the tissue sample.  The specimen is marked for pathology.  IMPRESSION: Needle localization of indeterminate microcalcifications right breast.  No apparent complications.   Original Report Authenticated By: Elberta Fortis, M.D.     Microbiology: No results found for this or any previous visit (from the past 240 hour(s)).   Labs: Basic Metabolic Panel:  Recent Labs Lab 02/05/13 0937 02/10/13 2100  NA 141 141  K 3.3* 3.8  CL 104 103  CO2 30 25  GLUCOSE 101* 111*  BUN 20 11  CREATININE 1.17* 1.07  CALCIUM 10.2 10.5   Liver Function Tests: No results found for this basename: AST, ALT, ALKPHOS, BILITOT, PROT, ALBUMIN,  in the last 168 hours No results found for this basename: LIPASE, AMYLASE,  in the last 168 hours No results found for this basename: AMMONIA,  in the last 168 hours CBC:  Recent Labs Lab 02/05/13 0937 02/10/13 2100  WBC 9.3 11.0*  NEUTROABS  --  8.3*  HGB 12.0 11.9*  HCT 36.9 35.4*  MCV 81.3 80.6  PLT 263 233   Cardiac Enzymes:  Recent Labs Lab 02/10/13 2100  TROPONINI <0.30   BNP: BNP (last 3 results)  Recent Labs  02/10/13 2100  PROBNP 112.6   CBG: No results found for this basename: GLUCAP,  in the last 168 hours     Signed:  Penny [name]  [facility] 02/11/2013, 1:19 PM

## 2013-02-11 NOTE — H&P (Signed)
[facility] History and Physical  [name] ZOX:[id number] DOB: Dec 23, 1945 DOA: 02/10/2013  Referring physician: Clarene Duke PCP: [name], MD   Chief Complaint: Shortness of breath and "pneumonia"  HPI: [name] is a 67 y.o. female who presents to the emergency room with cough shortness of breath chest congestion and wheezing for several days. She'll low grade fever of 99.5 when seen in the walk-in clinic last night. She was reportedly prescribed Augmentin. She had a rough night and attempted to make an appointment with her primary care provider. She was unable, so went to an urgent care center. She was reportedly told she had pneumonia. In the emergency room, CT angiogram of the chest is a poor study for PE, but no evidence of infiltrate. She has a temperature of 99.4. Heart rate ranging 80-115. Respiratory rate 18 normal oxygen saturations. Venous lactic acid is 3.4. The ED physician has recommended admission, but the patient apparently had planned on leaving AMA but subsequently changed her mind. She received bronchodilators in the emergency room and currently her wheezing is better. Her cough is somewhat improved. Her appetite has been good without vomiting or diarrhea. She reports having had several previous bouts of pneumonia requiring hospitalization. She does not smoke. Rarely, she uses an inhaler at home and suffering from a respiratory illness. She used this yesterday with some relief.  Review of Systems: Systems reviewed and as above otherwise negative  Past Medical History  Diagnosis Date  . OSA (obstructive sleep apnea)   . Hypertension   . Hyperlipidemia   . Headache(784.0)   . Blood transfusion   . Pneumonia 4/12  . Anemia   . Anxiety   . Depression     Dr.Lewit  . Diverticulosis   . Radiation 10/29/11-11/24/11    left breast 6100 cGy  . Tinnitus   . History of colon polyps 1998    adenomatous  . Spinal stenosis   . Bronchitis     hx of  .  Shortness of breath     with exertion  . Arthritis   . Breast cancer 2013    left breast   Past Surgical History  Procedure Laterality Date  . Total knee arthroplasty  2006    bilateral  . Polypectomy  1990's  . Appendectomy  1978  . Thyroid cyst excision  1994  . Transphenoidal / transnasal hypophysectomy / resection pituitary tumor  6/11  . Belpharoptosis repair    . Breast lumpectomy  08/2011    left  . Joint replacement Bilateral     bilateral knee  . Colonoscopy w/ polypectomy    . Breast lumpectomy with needle localization Right 02/06/2013    Procedure: RIGHT BREAST LUMPECTOMY WITH NEEDLE LOCALIZATION;  Surgeon: Shelly Rubenstein, MD;  Location: [facility] OR;  Service: General;  Laterality: Right;   Social History:  reports that she has never smoked. She has never used smokeless tobacco. She reports that she does not drink alcohol or use illicit drugs. married   Allergies  Allergen Reactions  . Cymbalta (Duloxetine Hcl)     Stomach pain  . Maxzide (Hydrochlorothiazide W-Triamterene) Other (See Comments)    Cramping   . Pravastatin Sodium Other (See Comments)    Headache   . Lodine (Etodolac) Itching    Family History  Problem Relation Age of Onset  . Heart disease Father   . Clotting disorder Father   . Stroke Father   . Stroke Mother   . Diabetes Mother   . Colon  cancer Neg Hx   . Cancer Sister 39    Breast Cancer   Prior to Admission medications   Medication Sig Start Date End Date Taking? Authorizing Provider  albuterol (PROVENTIL HFA;VENTOLIN HFA) 108 (90 BASE) MCG/ACT inhaler Inhale 2 puffs into the lungs every 6 (six) hours as needed for wheezing.   Yes Historical Provider, MD  buPROPion (WELLBUTRIN XL) 150 MG 24 hr tablet Take 150 mg by mouth daily.   Yes Historical Provider, MD  cholecalciferol (VITAMIN D) 1000 UNITS tablet Take 1,000 Units by mouth daily.   Yes Historical Provider, MD  clopidogrel (PLAVIX) 75 MG tablet Take 75 mg by mouth daily.     Yes  Historical Provider, MD  fish oil-omega-3 fatty acids 1000 MG capsule Take 1 capsule by mouth 2 (two) times daily.     Yes Historical Provider, MD  HYDROcodone-acetaminophen (NORCO) 10-325 MG per tablet Take 1 tablet by mouth every 8 (eight) hours as needed. Pain.   Yes Historical Provider, MD  losartan-hydrochlorothiazide (HYZAAR) 100-25 MG per tablet Take 1 tablet by mouth daily.     Yes Historical Provider, MD  niacin (NIASPAN) 500 MG CR tablet Take 500 mg by mouth 2 (two) times daily.    Yes Historical Provider, MD  nystatin-triamcinolone (MYCOLOG II) cream Apply 1 application topically 2 (two) times daily as needed (itching).  11/04/12  Yes Historical Provider, MD  simvastatin (ZOCOR) 40 MG tablet Take 40 mg by mouth at bedtime.     Yes Historical Provider, MD  tamoxifen (NOLVADEX) 20 MG tablet Take 1 tablet (20 mg total) by mouth daily. 12/09/12  Yes Keitha Butte, NP  verapamil (VERELAN PM) 240 MG 24 hr capsule Take 240 mg by mouth daily.    Yes Historical Provider, MD   Physical Exam: Filed Vitals:   02/10/13 2349 02/11/13 0011 02/11/13 0048 02/11/13 0056  BP: 157/93  129/80   Pulse: 100 101 115   Temp:    99.4 F (37.4 C)  TempSrc:    Oral  Resp: 15 18    Height:      Weight:      SpO2: 99% 99% 99%    BP 129/80  Pulse 115  Temp(Src) 99.4 F (37.4 C) (Oral)  Resp 18  Ht 5\' 6"  (1.676 m)  Wt 132.45 kg (292 lb)  BMI 47.15 kg/m2  SpO2 99%  General Appearance:    Alert, cooperative, no distress, appears stated age  Head:    Normocephalic, without obvious abnormality, atraumatic  Eyes:    PERRL, conjunctiva/corneas clear, EOM's intact, fundi    benign, both eyes  Ears:    Normal TM's and external ear canals, both ears  Nose:   Nares normal, septum midline, mucosa normal, no drainage    or sinus tenderness  Throat:   Lips, mucosa, and tongue normal; teeth and gums normal  Neck:   Supple, symmetrical, trachea midline, no adenopathy;    thyroid:  no  enlargement/tenderness/nodules; no carotid   bruit or JVD  Back:     Symmetric, no curvature, ROM normal, no CVA tenderness  Lungs:     Clear to auscultation bilaterally, respirations unlabored no wheezes rhonchi or rales   Chest Wall:    No tenderness or deformity   Heart:    Regular rate and rhythm, S1 and S2 normal, no murmur, rub   or gallop     Abdomen:     Soft, non-tender, bowel sounds active obese.   Genitalia:   deferred  Rectal:   deferred   Extremities:   Extremities normal, atraumatic, no cyanosis or edema  Pulses:   2+ and symmetric all extremities  Skin:   Skin color, texture, turgor normal, no rashes or lesions  Lymph nodes:   Cervical, supraclavicular, and axillary nodes normal  Neurologic:   CNII-XII intact, normal strength, sensation and reflexes    throughout    Psych: initially very defensive, but eventually, quite cooperative and pleasant  Labs on Admission:  Basic Metabolic Panel:  Recent Labs Lab 02/05/13 0937 02/10/13 2100  NA 141 141  K 3.3* 3.8  CL 104 103  CO2 30 25  GLUCOSE 101* 111*  BUN 20 11  CREATININE 1.17* 1.07  CALCIUM 10.2 10.5   Liver Function Tests: No results found for this basename: AST, ALT, ALKPHOS, BILITOT, PROT, ALBUMIN,  in the last 168 hours No results found for this basename: LIPASE, AMYLASE,  in the last 168 hours No results found for this basename: AMMONIA,  in the last 168 hours CBC:  Recent Labs Lab 02/05/13 0937 02/10/13 2100  WBC 9.3 11.0*  NEUTROABS  --  8.3*  HGB 12.0 11.9*  HCT 36.9 35.4*  MCV 81.3 80.6  PLT 263 233   Cardiac Enzymes:  Recent Labs Lab 02/10/13 2100  TROPONINI <0.30    BNP (last 3 results)  Recent Labs  02/10/13 2100  PROBNP 112.6   CBG: No results found for this basename: GLUCAP,  in the last 168 hours  Radiological Exams on Admission: Dg Chest 2 View  02/10/2013   *RADIOLOGY REPORT*  Clinical Data: Cough, shortness of breath.  CHEST - 2 VIEW  Comparison: 02/05/2013   Findings: Stable cardiomegaly. Low lung volumes with some mild subsegmental atelectasis in both lower lobes.  No focal airspace consolidation or overt edema.  No effusion.  Minimal spurring in the lower thoracic spine.  IMPRESSION: Low volumes.  No acute disease.   Original Report Authenticated By: D. Hassell III, MD   Ct Angio Chest Pe W/cm &/or Wo Cm  02/10/2013   *RADIOLOGY REPORT*  Clinical Data: Shortness of breath and elevated D-dimer. Recent of right breast surgery.  CT ANGIOGRAPHY CHEST  Technique:  Multidetector CT imaging of the chest using the standard protocol during bolus administration of intravenous contrast. Multiplanar reconstructed images including MIPs were obtained and reviewed to evaluate the vascular anatomy.  Contrast: OMNIPAQUE IOHEXOL 350 MG/ML SOLN  Comparison: 09/15/2011  Findings: No evidence for an aortic dissection.  There is a poor opacification of the pulmonary arteries.  No evidence for a large or central pulmonary embolism.  Limited evaluation of the lobar and more distal pulmonary arteries.  There is an air-fluid collection in the right breast consistent with recent surgery.  The thyroid tissue is asymmetric and the right thyroid lobe may be hypoplastic. No significant chest lymphadenopathy.  There may be a gallstone. Otherwise, images of the upper abdomen are unremarkable.  The trachea and mainstem bronchi are patent.  There is atelectasis in the dependent aspects of both lungs.  No significant consolidation or airspace disease.  No acute bony abnormality.  IMPRESSION: Limited evaluation for pulmonary emboli due to poor contrast opacification of the pulmonary arteries.  There is no evidence for large or central pulmonary embolism.  Evaluation of the lobar and more distal vessels is limited.  Atelectasis in the lungs without focal consolidation.  Postsurgical changes in the right breast.   Original Report Authenticated By: Richarda Overlie, M.D.    EKG: SINUS  TACHYCARDIA PROBABLE LEFT ATRIAL ABNORMALITY LVH WITH SECONDARY REPOLARIZATION ABNORMALITY  PROBABLE INFERIOR INFARCT, AGE INDETERMINATE ~  Assessment/Plan    Dyspnea: Likely from acute bronchitis with bronchospasm. D-dimer elevated and CT angiogram not inadequate study for PE rule out, but clinically, patient's story is more consistent with upper respiratory infection. Would not workup further. Symptoms improved. However, As patient has a lactic acidosis would be reasonable to observe overnight, hydrate and repeat lactic acid level in the morning. If improved, will likely be able to go home if otherwise stable.    HYPERLIPIDEMIA    HYPERTENSION    OSA (obstructive sleep apnea) on CPAP    Breast cancer, left breast. Most recent lumpectomy negative for malignancy    Lactic acidosis: No evidence of sepsis. See above.    Acute bronchitis: No evidence of pneumonia. Will change antibiotics to Levaquin    Obesity  Code Status: full Family Communication: none Disposition Plan: home  Time spent: 50 minues  Jess Sulak L [facility] Pager [phone]  If 7PM-7AM, please contact night-coverage [email or web address] Password [facility] 02/11/2013, 12:58 AM

## 2013-02-11 NOTE — Progress Notes (Addendum)
Pt voices understanding of discharge instructions.  No changes noted since am assessment.  Script given to pt for Levaquin.

## 2013-02-11 NOTE — Progress Notes (Signed)
RN and I spoke with pt regarding cpap.  Pt refused cpap, stating she was going to try without it tonight.  Pt was advised that RT available all night should she change her mind.

## 2013-02-11 NOTE — ED Notes (Signed)
Resp called for breathing tx

## 2013-02-11 NOTE — Progress Notes (Signed)
ANTICOAGULATION CONSULT NOTE - Initial Consult  Pharmacy Consult for lovenox Indication: VTE prophylaxis  Allergies  Allergen Reactions  . Cymbalta (Duloxetine Hcl)     Stomach pain  . Maxzide (Hydrochlorothiazide W-Triamterene) Other (See Comments)    Cramping   . Pravastatin Sodium Other (See Comments)    Headache   . Lodine (Etodolac) Itching    Patient Measurements: Height: 5\' 6"  (167.6 cm) Weight: 286 lb 9.6 oz (130 kg) IBW/kg (Calculated) : 59.3 Heparin Dosing Weight:   Vital Signs: Temp: 98.1 F (36.7 C) (07/15 0609) Temp src: Oral (07/15 0609) BP: 119/61 mmHg (07/15 0613) Pulse Rate: 81 (07/15 0609)  Labs:  Recent Labs  02/10/13 2100  HGB 11.9*  HCT 35.4*  PLT 233  CREATININE 1.07  TROPONINI <0.30    Estimated Creatinine Clearance: 68.6 ml/min (by C-G formula based on Cr of 1.07).   Medical History: Past Medical History  Diagnosis Date  . OSA (obstructive sleep apnea)   . Hypertension   . Hyperlipidemia   . Headache(784.0)   . Blood transfusion   . Pneumonia 4/12  . Anemia   . Anxiety   . Depression     Dr.Lewit  . Diverticulosis   . Radiation 10/29/11-11/24/11    left breast 6100 cGy  . Tinnitus   . History of colon polyps 1998    adenomatous  . Spinal stenosis   . Bronchitis     hx of  . Shortness of breath     with exertion  . Arthritis   . Breast cancer 2013    left breast    Medications:  Scheduled:  . albuterol  2.5 mg Nebulization BID  . buPROPion  150 mg Oral Daily  . cholecalciferol  1,000 Units Oral Daily  . clopidogrel  75 mg Oral Daily  . dextromethorphan-guaiFENesin  1 tablet Oral BID  . docusate sodium  100 mg Oral BID  . enoxaparin (LOVENOX) injection  60 mg Subcutaneous Q24H  . hydrochlorothiazide  25 mg Oral Daily  . levofloxacin  500 mg Oral Daily  . losartan  100 mg Oral Daily  . niacin  500 mg Oral BID  . simvastatin  40 mg Oral QHS  . tamoxifen  20 mg Oral Daily  . verapamil  240 mg Oral Daily     Assessment: Patient with order for lovenox and pharmacy may adjust.  Patient weight 130kg, and CrCl > 34mL/min.  Will adjust dose to 0.5mg /kg sq q24hr Goal of Therapy:  Enoxaparin dosed based on patient weight and renal function  Monitor platelets by anticoagulation protocol: Yes   Plan:  Change lovenox to 60mg  sq q24hr  Aleene Davidson Crowford 02/11/2013,6:43 AM

## 2013-02-25 ENCOUNTER — Encounter (INDEPENDENT_AMBULATORY_CARE_PROVIDER_SITE_OTHER): Payer: Self-pay | Admitting: Surgery

## 2013-02-25 ENCOUNTER — Ambulatory Visit (INDEPENDENT_AMBULATORY_CARE_PROVIDER_SITE_OTHER): Payer: Medicare Other | Admitting: Surgery

## 2013-02-25 VITALS — BP 130/80 | HR 84 | Resp 16 | Ht 66.0 in | Wt 292.6 lb

## 2013-02-25 DIAGNOSIS — Z09 Encounter for follow-up examination after completed treatment for conditions other than malignant neoplasm: Secondary | ICD-10-CM

## 2013-02-25 NOTE — Progress Notes (Signed)
Subjective:     Patient ID: [name], female   DOB: 11/08/1945, 67 y.o.   MRN: [id number]  HPI She is here for her first postoperative visit status post needle localized right breast lumpectomy. Again she is a cancer patient of mine regarding left breast cancer. From the right side at surgery, she is doing well and has no complaints  Review of Systems     Objective:   Physical Exam On exam, her biopsy site is well healed. I also examined her left breast and there are no palpable masses or adenopathy. Her most recent mammograms were negative on the left  . The final pathology on  The right breast lumpectomy showed benign calcifications without evidence of malignancy    Assessment:     Patient stable postop as well as staples long-term history of left breast cancer     Plan:     She may resume her normal activity. She will continue to be followed at the cancer center. I will see her back in one year

## 2013-03-05 ENCOUNTER — Other Ambulatory Visit: Payer: Self-pay

## 2013-05-30 ENCOUNTER — Other Ambulatory Visit: Payer: Self-pay | Admitting: Neurosurgery

## 2013-05-30 DIAGNOSIS — D497 Neoplasm of unspecified behavior of endocrine glands and other parts of nervous system: Secondary | ICD-10-CM

## 2013-06-05 ENCOUNTER — Other Ambulatory Visit: Payer: Self-pay

## 2013-06-09 ENCOUNTER — Ambulatory Visit
Admission: RE | Admit: 2013-06-09 | Discharge: 2013-06-09 | Disposition: A | Discharge status: Home / Self Care | Payer: Medicare Other | Source: Ambulatory Visit | Attending: Neurosurgery | Admitting: Neurosurgery

## 2013-06-09 DIAGNOSIS — D497 Neoplasm of unspecified behavior of endocrine glands and other parts of nervous system: Secondary | ICD-10-CM

## 2013-06-09 MED ORDER — GADOBENATE DIMEGLUMINE 529 MG/ML IV SOLN
10.0000 mL | Freq: Once | INTRAVENOUS | Status: AC | PRN
  Administered 2013-06-09: 10 mL via INTRAVENOUS

## 2013-06-10 ENCOUNTER — Other Ambulatory Visit: Payer: Medicare Other

## 2013-06-12 ENCOUNTER — Telehealth: Payer: Self-pay | Admitting: Oncology

## 2013-06-12 ENCOUNTER — Ambulatory Visit (HOSPITAL_BASED_OUTPATIENT_CLINIC_OR_DEPARTMENT_OTHER): Payer: Medicare Other | Admitting: Oncology

## 2013-06-12 ENCOUNTER — Other Ambulatory Visit (HOSPITAL_BASED_OUTPATIENT_CLINIC_OR_DEPARTMENT_OTHER): Payer: Medicare Other | Admitting: Lab

## 2013-06-12 ENCOUNTER — Encounter: Payer: Self-pay | Admitting: Oncology

## 2013-06-12 VITALS — BP 162/70 | HR 78 | Temp 99.2°F | Resp 18 | Ht 66.0 in | Wt 284.7 lb

## 2013-06-12 DIAGNOSIS — C50412 Malignant neoplasm of upper-outer quadrant of left female breast: Secondary | ICD-10-CM

## 2013-06-12 DIAGNOSIS — E876 Hypokalemia: Secondary | ICD-10-CM

## 2013-06-12 DIAGNOSIS — R791 Abnormal coagulation profile: Secondary | ICD-10-CM

## 2013-06-12 DIAGNOSIS — C50419 Malignant neoplasm of upper-outer quadrant of unspecified female breast: Secondary | ICD-10-CM

## 2013-06-12 DIAGNOSIS — L738 Other specified follicular disorders: Secondary | ICD-10-CM

## 2013-06-12 DIAGNOSIS — C50919 Malignant neoplasm of unspecified site of unspecified female breast: Secondary | ICD-10-CM

## 2013-06-12 DIAGNOSIS — Z853 Personal history of malignant neoplasm of breast: Secondary | ICD-10-CM | POA: Insufficient documentation

## 2013-06-12 DIAGNOSIS — Z170 Estrogen receptor positive status [ER+]: Secondary | ICD-10-CM | POA: Insufficient documentation

## 2013-06-12 LAB — CBC & DIFF AND RETIC
Basophils Absolute: 0 10*3/uL (ref 0.0–0.1)
Eosinophils Absolute: 0.1 10*3/uL (ref 0.0–0.5)
HCT: 35.5 % (ref 34.8–46.6)
HGB: 11.3 g/dL — ABNORMAL LOW (ref 11.6–15.9)
Immature Retic Fract: 11.4 % — ABNORMAL HIGH (ref 1.60–10.00)
MCHC: 31.8 g/dL (ref 31.5–36.0)
MCV: 81.6 fL (ref 79.5–101.0)
MONO#: 0.6 10*3/uL (ref 0.1–0.9)
NEUT%: 66.7 % (ref 38.4–76.8)
WBC: 7 10*3/uL (ref 3.9–10.3)
lymph#: 1.6 10*3/uL (ref 0.9–3.3)

## 2013-06-12 LAB — COMPREHENSIVE METABOLIC PANEL (CC13)
AST: 12 U/L (ref 5–34)
Albumin: 3.2 g/dL — ABNORMAL LOW (ref 3.5–5.0)
Alkaline Phosphatase: 63 U/L (ref 40–150)
Anion Gap: 9 mEq/L (ref 3–11)
Creatinine: 1.2 mg/dL — ABNORMAL HIGH (ref 0.6–1.1)
Potassium: 4.2 mEq/L (ref 3.5–5.1)
Sodium: 143 mEq/L (ref 136–145)
Total Bilirubin: 0.24 mg/dL (ref 0.20–1.20)
Total Protein: 6.9 g/dL (ref 6.4–8.3)

## 2013-06-12 NOTE — Progress Notes (Signed)
ID: [name]   DOB: March 14, 1946  MR#: [id number]  CSN#:[id number]  PCP: [name] Blamer, MD GYN:  SUAbigail Miyamoto, MD OTHER MD: [name] Blackbird, MD, [name]   HISTORY OF PRESENT ILLNESS: [name] had routine screening mammography December 2012 showing a possible mass in the left breast. This was confirmed with left diagnostic mammography 07/13/2011, with an ultrasound that day showing a hypoechoic mass measuring 6 mm in the upper outer quadrant of the left breast. Biopsy of this mass 08/02/2011 showed (SAA [id number]) and invasive ductal carcinoma, which was estrogen receptor 100% positive, progesterone receptor 100% positive, with an MIB-1 of 6%, and no HER-2 amplification.  Breast MRI 08/08/2011 showed a solitary 1.2 cm mass in the upper outer quadrant of the left breast, and the patient underwent definitive left lumpectomy 08/30/2011 for a 1.2 cm invasive ductal carcinoma, grade 1, with all 3 sentinel lymph nodes clear. That he was evaluated by Dr. Pierce [name], and after her radiation treatments was completed he tried her on a rim index initially, then letrozole. The patient was unable to tolerate those medications. The patient's subsequent history is as detailed below  INTERVAL HISTORY: [name] returns today for followup of her left breast invasive ductal carcinoma.  Since her last visit here she had a scare, with some calcifications in the right breast, leading to right lumpectomy 02/06/2013, showing only benign breast tissue ([id number]). She also also found to have an elevated PTH and was referred to Dr. Rayburn [name] for parathyroidectomy but according to the patient her insurance would not pay for that.  REVIEW OF SYSTEMS: She is tolerating the tamoxifen with hot flashes as the primary symptom. These are frequent but brief. They don't wake her up at night. She is having sinus problems, and is on her second round of antibiotics at present. She has stress headaches, which are frequent. She has  what problems that can't be solved and I am trying to nothing about them". She would not elaborate. She did just have a brain MRI which was negative. Otherwise she feels fatigued, has significant spinal stenosis or cannot walk far, does not [name] swim, complains of hearing loss, shortness of breath with activity, and then some anxiety and depression. She does feel that the antidepressants are helping. A detailed review of systems was otherwise noncontributory  PAST MEDICAL HISTORY: Past Medical History  Diagnosis Date  . OSA (obstructive sleep apnea)   . Hypertension   . Hyperlipidemia   . Headache(784.0)   . Blood transfusion   . Pneumonia 4/12  . Anemia   . Anxiety   . Depression     [name]  . Diverticulosis   . Radiation 10/29/11-11/24/11    left breast 6100 cGy  . Tinnitus   . History of colon polyps 1998    adenomatous  . Spinal stenosis   . Bronchitis     hx of  . Shortness of breath     with exertion  . Arthritis   . Breast cancer 2013    left breast    PAST SURGICAL HISTORY: Past Surgical History  Procedure Laterality Date  . Total knee arthroplasty  2006    bilateral  . Polypectomy  1990's  . Appendectomy  1978  . Thyroid cyst excision  1994  . Transphenoidal / transnasal hypophysectomy / resection pituitary tumor  6/11  . Belpharoptosis repair    . Breast lumpectomy  08/2011    left  . Joint replacement Bilateral     bilateral knee  .  Colonoscopy w/ polypectomy    . Breast lumpectomy with needle localization Right 02/06/2013    Procedure: RIGHT BREAST LUMPECTOMY WITH NEEDLE LOCALIZATION;  Surgeon: [name] Rubenstein, MD;  Location: [facility] OR;  Service: General;  Laterality: Right;    FAMILY HISTORY Family History  Problem Relation Age of Onset  . Heart disease Father   . Clotting disorder Father   . Stroke Father   . Stroke Mother   . Diabetes Mother   . Colon cancer Neg Hx   . Cancer Sister 67    Breast Cancer  The patient's father died at the age of  32 from a ruptured aneurysm (the patient is not sure if this was cerebral or thoracic). The patient's mother died at the age of 46 from a stroke. The patient had one brother and one sister. Her sister has recently been diagnosed with breast cancer at the age of 18. There is no other history of breast or ovarian cancer in the family.  GYNECOLOGIC HISTORY: Menarche age 18, first live birth age 61, the patient is GX P2, change of life at age 76. She never took hormone replacement.  SOCIAL HISTORY: Her husband is retired.  She is retired from a clerical work Event organiser. She has been married 50 years. Her husband, [name], owns his own Technical sales engineer business. Daughter [name] works for [employer] here in [place]. Son [name] works as a Insurance risk surveyor in [employer]. The patient has 3 grandchildren. She attends a local [employer]   ADVANCED DIRECTIVES: Not on file  HEALTH MAINTENANCE: History  Substance Use Topics  . Smoking status: Never Smoker   . Smokeless tobacco: Never Used  . Alcohol Use: No     Colonoscopy: Not on file  PAP: Not on file  Bone density: Her last bone density scan on 09/28/2011 showed a T score of 2.0 (normal).  Lipid panel: Not on file  Allergies  Allergen Reactions  . Cymbalta [Duloxetine Hcl]     Stomach pain  . Maxzide [Hydrochlorothiazide W-Triamterene] Other (See Comments)    Cramping   . Pravastatin Sodium Other (See Comments)    Headache   . Lodine [Etodolac] Itching    Current Outpatient Prescriptions  Medication Sig Dispense Refill  . albuterol (PROVENTIL HFA;VENTOLIN HFA) 108 (90 BASE) MCG/ACT inhaler Inhale 2 puffs into the lungs every 6 (six) hours as needed for wheezing.      [name] amoxicillin-clavulanate (AUGMENTIN) 875-125 MG per tablet       . buPROPion (WELLBUTRIN XL) 150 MG 24 hr tablet Take 150 mg by mouth daily.      . cholecalciferol (VITAMIN D) 1000 UNITS tablet Take 1,000 Units by mouth daily.      . clopidogrel (PLAVIX) 75 MG tablet Take 75 mg by mouth  daily.        . fish oil-omega-3 fatty acids 1000 MG capsule Take 1 capsule by mouth 2 (two) times daily.        [name] HYDROcodone-acetaminophen (NORCO) 10-325 MG per tablet Take 1 tablet by mouth every 8 (eight) hours as needed. Pain.      [name] KLOR-CON M20 20 MEQ tablet       . levofloxacin (LEVAQUIN) 500 MG tablet Take 1 tablet (500 mg total) by mouth daily.  4 tablet  0  . losartan-hydrochlorothiazide (HYZAAR) 100-25 MG per tablet Take 1 tablet by mouth daily.        . niacin (NIASPAN) 500 MG CR tablet Take 500 mg by mouth 2 (two) times daily.       [name]  nystatin-triamcinolone (MYCOLOG II) cream Apply 1 application topically 2 (two) times daily as needed (itching).       . simvastatin (ZOCOR) 40 MG tablet Take 40 mg by mouth at bedtime.        . tamoxifen (NOLVADEX) 20 MG tablet Take 1 tablet (20 mg total) by mouth daily.  90 tablet  8  . verapamil (VERELAN PM) 240 MG 24 hr capsule Take 240 mg by mouth daily.        No current facility-administered medications for this visit.   Facility-Administered Medications Ordered in Other Visits  Medication Dose Route Frequency Provider Last Rate Last Dose  . silver sulfADIAZINE (SILVADENE) 1 % cream   Topical BID Billie Lade, MD   1 application at 11/14/11 8119    OBJECTIVE: Middle-aged African American woman who appears stated age 27 Vitals:   06/12/13 1017  BP: 162/70  Pulse: 78  Temp: 99.2 F (37.3 C)  Resp: 18     Body mass index is 45.97 kg/(m^2).    ECOG FS: 1  Sclerae unicteric, arcus senilis noted Oropharynx no thrush or other lesions No cervical or supraclavicular adenopathy, no cervical masses Lungs no rales or rhonchi Heart regular rate and rhythm, chronic right jugular vein visible pulsation/distention Abd obese, nontender, positive bowel sounds MSK  no focal spinal tenderness, no peripheral edema,  Neuro: nonfocal, alert, appropriate affect Breasts: The right breast is status post recent lumpectomy. The incision is healing  very nicely. There is no of hyperpigmentation outlining where she had a prior bandage. The right axilla is benign. The left breast is status post lumpectomy and radiation. There is no evidence of local recurrence. The left axilla is benign. Skin: Numerous nevi on trunk and cervical areas    LAB RESULTS: Results for CHERY, GIUSTO (MRN [id number]) as of 06/12/2013 10:46  Ref. Range 12/09/2012 14:23  PTH Latest Range: 14.0-72.0 pg/mL 101.2 (H)    Lab Results  Component Value Date   WBC 7.0 06/12/2013   NEUTROABS 4.7 06/12/2013   HGB 11.3* 06/12/2013   HCT 35.5 06/12/2013   MCV 81.6 06/12/2013   PLT 214 06/12/2013      Chemistry      Component Value Date/Time   NA 141 02/10/2013 2100   NA 141 12/09/2012 1423   K 3.8 02/10/2013 2100   K 3.2* 12/09/2012 1423   CL 103 02/10/2013 2100   CL 105 12/09/2012 1423   CO2 25 02/10/2013 2100   CO2 25 12/09/2012 1423   BUN 11 02/10/2013 2100   BUN 17.7 12/09/2012 1423   CREATININE 1.07 02/10/2013 2100   CREATININE 1.2* 12/09/2012 1423      Component Value Date/Time   CALCIUM 10.5 02/10/2013 2100   CALCIUM 10.1 12/09/2012 1423   CALCIUM 9.9 12/09/2012 1423   ALKPHOS 78 12/09/2012 1423   ALKPHOS 63 01/17/2012 1431   AST 16 12/09/2012 1423   AST 11 01/17/2012 1431   ALT 16 12/09/2012 1423   ALT 19 01/17/2012 1431   BILITOT 0.35 12/09/2012 1423   BILITOT 0.3 01/17/2012 1431       Lab Results  Component Value Date   LABCA2 12 01/17/2012    No components found with this basename: [id number]    No results found for this basename: INR,  in the last 168 hours  Urinalysis    Component Value Date/Time   COLORURINE YELLOW 02/10/2013 2103   APPEARANCEUR CLEAR 02/10/2013 2103   LABSPEC 1.017 02/10/2013 2103   PHURINE 5.5  02/10/2013 2103   GLUCOSEU NEGATIVE 02/10/2013 2103   HGBUR NEGATIVE 02/10/2013 2103   BILIRUBINUR NEGATIVE 02/10/2013 2103   KETONESUR NEGATIVE 02/10/2013 2103   PROTEINUR NEGATIVE 02/10/2013 2103   UROBILINOGEN 0.2 02/10/2013 2103    NITRITE NEGATIVE 02/10/2013 2103   LEUKOCYTESUR MODERATE* 02/10/2013 2103    STUDIES: Mr Laqueta Jean Wo Contrast  06/29/2013   CLINICAL DATA:  Annual followup after removal of pituitary macroadenoma in 2011. No current complaints.  EXAM: MRI HEAD WITHOUT AND WITH CONTRAST  TECHNIQUE: Multiplanar, multiecho pulse sequences of the brain and surrounding structures were obtained without and with intravenous contrast. Pituitary protocol was performed.  BUN and creatinine were obtained on site at [facility] at  [address]  Results:  BUN 13 mg/dL,  Creatinine 1.3 mg/dL.  COMPARISON:  01/25/2012.  FINDINGS: Sequelae of transsphenoidal pituitary mass resection are again identified. There remains opacification of the right sphenoid sinus, likely reflecting prior surgery and fat packing. Expansion of the sella is unchanged. Small amount of enhancing tissue within the floor of the sella likely represents normal residual pituitary gland. The infundibulum is midline. Optic chiasm is normal. Cavernous carotid flow voids are normal.  Small, scattered foci of T2/FLAIR hyperintensity are present within the subcortical and deep cerebral white matter as well as within the pons, nonspecific but compatible with mild chronic small vessel ischemic disease. These have slightly progressed from the prior study, for example there is a new small focus of FLAIR hyperintensity in the subcortical right frontal lobe. There is mild cerebral atrophy. There is no evidence of acute infarct, mass lesion, midline shift, or extra-axial fluid collection. There is no abnormal enhancement. The globes are unremarkable. Major intracranial flow voids are normal.  IMPRESSION: 1. Prior transsphenoidal pituitary resection without evidence of recurrent mass. 2. Mild chronic small vessel ischemic disease, slightly increased from prior.   Electronically Signed   By: Sebastian Ache   On: 29-Jun-2013 20:12     ASSESSMENT: 67 y.o. [place] woman  status post left lumpectomy and sentinel lymph node sampling 08/30/2011 for a pT1c pN0, stage IA invasive ductal carcinoma, grade 1, estrogen and progesterone receptor positive, both at 100%, with an MIB-1 of 6%, and no HER-2 amplification.  (1) Oncotype DX score of 3 predicted a distant recurrence within 10 years of 3% if the patient's only adjuvant treatment was tamoxifen for 5 years  (2) Adjuvant! Online would quote her a risk of recurrence of 16% (including in breast recurrences) dropping to 8% with 5 years of an aromatase inhibitor  (3) Completed adjuvant radiation treatments 11/24/2011  (4)  The patient was unable to take letrozole or anastrozole secondary to concerns regarding jaw pain and constipation.  (5)  Tamoxifen  to be started 10/11/2012   (6) Persistent moderate elevation of the blood calcium level; with elevated  PTH (insurance refused to approve parathyroidectomy)  (7) Hypokalemia   (8)  Left axillary folliculitis  (9) benign right breast lumpectomy 02/06/2013 for suspicious calcifications  PLAN:  From a breast cancer point of view [name] is doing fine. We are going to start seeing her on an every 6 month basis. We'll follow her labwork including of course the calcium and PTH. The overall plan will be to do tamoxifen for at least 5 years, and then reconsider whether we will undergo for the final 5 years. The patient is aware of the possible toxicities and complications of this drug including blood clots.  I am not sure why it was  not possible for her to get a parathyroidectomy done. She understands chronic hyperparathyroidism cancer in her bones, and other issues. She will pursue this further with her surgeon as appropriate.   Lowella Dell, MD  06/12/2013  10:48 AM

## 2013-06-12 NOTE — Addendum Note (Signed)
Addended by: Laroy Apple E on: 06/12/2013 12:25 PM   Modules accepted: Orders, Medications

## 2013-06-13 LAB — VITAMIN D 25 HYDROXY (VIT D DEFICIENCY, FRACTURES): Vit D, 25-Hydroxy: 49 ng/mL (ref 30–89)

## 2013-07-15 ENCOUNTER — Other Ambulatory Visit: Payer: Self-pay

## 2013-07-15 ENCOUNTER — Other Ambulatory Visit (INDEPENDENT_AMBULATORY_CARE_PROVIDER_SITE_OTHER): Payer: Self-pay | Admitting: Surgery

## 2013-07-15 DIAGNOSIS — Z853 Personal history of malignant neoplasm of breast: Secondary | ICD-10-CM

## 2013-07-15 DIAGNOSIS — Z9889 Other specified postprocedural states: Secondary | ICD-10-CM

## 2013-07-28 ENCOUNTER — Ambulatory Visit
Admission: RE | Admit: 2013-07-28 | Discharge: 2013-07-28 | Disposition: A | Discharge status: Home / Self Care | Payer: Medicare Other | Source: Ambulatory Visit | Attending: Surgery | Admitting: Surgery

## 2013-07-28 DIAGNOSIS — Z853 Personal history of malignant neoplasm of breast: Secondary | ICD-10-CM

## 2013-07-28 DIAGNOSIS — Z9889 Other specified postprocedural states: Secondary | ICD-10-CM

## 2013-08-06 ENCOUNTER — Other Ambulatory Visit: Payer: Self-pay | Admitting: Obstetrics and Gynecology

## 2013-12-11 ENCOUNTER — Other Ambulatory Visit (HOSPITAL_BASED_OUTPATIENT_CLINIC_OR_DEPARTMENT_OTHER): Payer: Medicare Other

## 2013-12-11 ENCOUNTER — Ambulatory Visit (HOSPITAL_COMMUNITY)
Admission: RE | Admit: 2013-12-11 | Discharge: 2013-12-11 | Disposition: A | Discharge status: Home / Self Care | Payer: Medicare Other | Source: Ambulatory Visit | Attending: Oncology | Admitting: Oncology

## 2013-12-11 ENCOUNTER — Encounter: Payer: Self-pay | Admitting: Physician Assistant

## 2013-12-11 ENCOUNTER — Telehealth: Payer: Self-pay | Admitting: Oncology

## 2013-12-11 ENCOUNTER — Ambulatory Visit (HOSPITAL_BASED_OUTPATIENT_CLINIC_OR_DEPARTMENT_OTHER): Payer: Medicare Other | Admitting: Physician Assistant

## 2013-12-11 VITALS — BP 131/79 | HR 82 | Temp 98.3°F | Resp 18 | Ht 66.0 in | Wt 294.5 lb

## 2013-12-11 DIAGNOSIS — E213 Hyperparathyroidism, unspecified: Secondary | ICD-10-CM

## 2013-12-11 DIAGNOSIS — R600 Localized edema: Secondary | ICD-10-CM

## 2013-12-11 DIAGNOSIS — M7989 Other specified soft tissue disorders: Secondary | ICD-10-CM

## 2013-12-11 DIAGNOSIS — C50419 Malignant neoplasm of upper-outer quadrant of unspecified female breast: Secondary | ICD-10-CM

## 2013-12-11 DIAGNOSIS — Z170 Estrogen receptor positive status [ER+]: Secondary | ICD-10-CM

## 2013-12-11 DIAGNOSIS — C50412 Malignant neoplasm of upper-outer quadrant of left female breast: Secondary | ICD-10-CM

## 2013-12-11 DIAGNOSIS — R232 Flushing: Secondary | ICD-10-CM | POA: Insufficient documentation

## 2013-12-11 DIAGNOSIS — Z853 Personal history of malignant neoplasm of breast: Secondary | ICD-10-CM

## 2013-12-11 DIAGNOSIS — R609 Edema, unspecified: Secondary | ICD-10-CM | POA: Insufficient documentation

## 2013-12-11 DIAGNOSIS — T451X5A Adverse effect of antineoplastic and immunosuppressive drugs, initial encounter: Secondary | ICD-10-CM

## 2013-12-11 DIAGNOSIS — E210 Primary hyperparathyroidism: Secondary | ICD-10-CM

## 2013-12-11 DIAGNOSIS — Z780 Asymptomatic menopausal state: Secondary | ICD-10-CM | POA: Insufficient documentation

## 2013-12-11 LAB — CBC WITH DIFFERENTIAL/PLATELET
BASO%: 0.6 % (ref 0.0–2.0)
BASOS ABS: 0 10*3/uL (ref 0.0–0.1)
EOS%: 1.2 % (ref 0.0–7.0)
Eosinophils Absolute: 0.1 10*3/uL (ref 0.0–0.5)
HEMATOCRIT: 34.7 % — AB (ref 34.8–46.6)
HEMOGLOBIN: 11.1 g/dL — AB (ref 11.6–15.9)
LYMPH%: 18.6 % (ref 14.0–49.7)
MCH: 26.2 pg (ref 25.1–34.0)
MCHC: 31.9 g/dL (ref 31.5–36.0)
MCV: 82.1 fL (ref 79.5–101.0)
MONO#: 0.5 10*3/uL (ref 0.1–0.9)
MONO%: 6.7 % (ref 0.0–14.0)
NEUT%: 72.9 % (ref 38.4–76.8)
NEUTROS ABS: 6 10*3/uL (ref 1.5–6.5)
Platelets: 260 10*3/uL (ref 145–400)
RBC: 4.23 10*6/uL (ref 3.70–5.45)
RDW: 15.4 % — ABNORMAL HIGH (ref 11.2–14.5)
WBC: 8.2 10*3/uL (ref 3.9–10.3)
lymph#: 1.5 10*3/uL (ref 0.9–3.3)

## 2013-12-11 LAB — COMPREHENSIVE METABOLIC PANEL (CC13)
ALBUMIN: 3.4 g/dL — AB (ref 3.5–5.0)
ALT: 11 U/L (ref 0–55)
ANION GAP: 12 meq/L — AB (ref 3–11)
AST: 9 U/L (ref 5–34)
Alkaline Phosphatase: 64 U/L (ref 40–150)
BUN: 11.7 mg/dL (ref 7.0–26.0)
CALCIUM: 10.4 mg/dL (ref 8.4–10.4)
CHLORIDE: 108 meq/L (ref 98–109)
CO2: 25 mEq/L (ref 22–29)
CREATININE: 1.2 mg/dL — AB (ref 0.6–1.1)
Glucose: 98 mg/dl (ref 70–140)
POTASSIUM: 4 meq/L (ref 3.5–5.1)
Sodium: 144 mEq/L (ref 136–145)
Total Bilirubin: 0.32 mg/dL (ref 0.20–1.20)
Total Protein: 7.1 g/dL (ref 6.4–8.3)

## 2013-12-11 LAB — LACTATE DEHYDROGENASE (CC13): LDH: 158 U/L (ref 125–245)

## 2013-12-11 NOTE — Progress Notes (Signed)
ID: Luna Kitchens   DOB: Nov 20, 1956  MR#: [id number]  YOV#:[id number]  PCP: Shirline Frees, MD GYN: Gus Height, MD SU: Coralie Keens, MD OTHER MD: Gery Pray, MD; Everlean Cherry  CHIEF COMPLAINT:  Hx of Left Breast Cancer/On tamoxifen   HISTORY OF PRESENT ILLNESS: Alesana had routine screening mammography December 2012 showing a possible mass in the left breast. This was confirmed with left diagnostic mammography 07/13/2011, with an ultrasound that day showing a hypoechoic mass measuring 6 mm in the upper outer quadrant of the left breast. Biopsy of this mass 08/02/2011 showed (SAA 13-19) and invasive ductal carcinoma, which was estrogen receptor 100% positive, progesterone receptor 100% positive, with an MIB-1 of 6%, and no HER-2 amplification.  Breast MRI 08/08/2011 showed a solitary 1.2 cm mass in the upper outer quadrant of the left breast, and the patient underwent definitive left lumpectomy 08/30/2011 for a 1.2 cm invasive ductal carcinoma, grade 1, with all 3 sentinel lymph nodes clear. That he was evaluated by Dr. Eston Esters, and after her radiation treatments was completed he tried her on a rim index initially, then letrozole. The patient was unable to tolerate those medications.   The patient's subsequent history is as detailed below  INTERVAL HISTORY: [place] returns alone today for 6 month followup of her left breast cancer.  Interval history he has been generally unremarkable since her last visit here in November. 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  She had a bilateral mammogram in December which was unremarkable.   Suellyn continues on tamoxifen with few complaints. She does have some occasional hot flashes and night sweats, but does not find these particularly problematic. She's noted no vaginal changes, no dryness, discharge, or abnormal bleeding. She has had some recent mild swelling and discomfort in the left  lower extremity which might warrant some additional evaluation, but to her knowledge, she has had no blood clots.   REVIEW OF SYSTEMS: Otherwise, Nechama has had no recent illnesses and denies any fevers or chills. She has mild fatigue. She's had no rashes or skin changes and denies any abnormal bruising or bleeding. Her appetite is good. She's had no nausea, emesis, or change in bowel or bladder habits.  She has some sinus congestion which she attributes to seasonal allergies. Her voice is a little hoarse at times, and she also notes some changes in her hearing. She's had no cough or phlegm production. She does have some shortness of breath with exertion which is stable. She's had no chest pain or palpitations. She has had no abnormal headaches or dizziness, but does feel somewhat forgetful. Her biggest complaints are actually back pain and joint pain. She has a known history of arthritis and spinal stenosis. She also has some mild anxiety and depression but denies any feelings of hopelessness or suicidal ideation.  A detailed review of systems is otherwise stable and noncontributory.    PAST MEDICAL HISTORY: Past Medical History  Diagnosis Date  . OSA (obstructive sleep apnea)   . Hypertension   . Hyperlipidemia   . Headache(784.0)   . Blood transfusion   . Pneumonia 4/12  . Anemia   . Anxiety   . Depression     Dr.Lewit  . Diverticulosis   . Radiation 10/29/11-11/24/11    left breast 6100 cGy  . Tinnitus   . History of colon polyps 1998    adenomatous  . Spinal stenosis   . Bronchitis  hx of  . Shortness of breath     with exertion  . Arthritis   . Breast cancer 2013    left breast    PAST SURGICAL HISTORY: Past Surgical History  Procedure Laterality Date  . Total knee arthroplasty  2006    bilateral  . Polypectomy  1990's  . Appendectomy  1978  . Thyroid cyst excision  1994  . Transphenoidal / transnasal hypophysectomy / resection pituitary tumor  6/11  .  Belpharoptosis repair    . Breast lumpectomy  08/2011    left  . Joint replacement Bilateral     bilateral knee  . Colonoscopy w/ polypectomy    . Breast lumpectomy with needle localization Right 02/06/2013    Procedure: RIGHT BREAST LUMPECTOMY WITH NEEDLE LOCALIZATION;  Surgeon: Harl Bowie, MD;  Location: [facility];  Service: General;  Laterality: Right;    FAMILY HISTORY Family History  Problem Relation Age of Onset  . Heart disease Father   . Clotting disorder Father   . Stroke Father   . Stroke Mother   . Diabetes Mother   . Colon cancer Neg Hx   . Cancer Sister 27    Breast Cancer  The patient's father died at the age of 69 from a ruptured aneurysm (the patient is not sure if this was cerebral or thoracic). The patient's mother died at the age of 71 from a stroke. The patient had one brother and one sister. Her sister has recently been diagnosed with breast cancer at the age of 55. There is no other history of breast or ovarian cancer in the family.  GYNECOLOGIC HISTORY:  (reviewed 12/11/2013) Menarche age 68, first live birth age 68, the patient is GX P2, change of life at age 68. She never took hormone replacement.  SOCIAL HISTORY:  (updated 12/11/2013) She is retired from a clerical work Designer, jewellery. She has been married 67 years. Her husband, Shanon Brow, owned his own [employer] and is retired. Daughter Coralyn Mark works for [employer] here in [place]. Son Jenetta Loges works as a Designer, industrial/product in [employer]. The patient has 3 grandchildren and 1 great granddaughter. She attends a local [place]: Not on file  HEALTH MAINTENANCE:  (updated 12/11/2013) History  Substance Use Topics  . Smoking status: Never Smoker   . Smokeless tobacco: Never Used  . Alcohol Use: No     Colonoscopy: September 2006, normal (to be repeated in 2016)/[facility]   PAP:  January 2015/[facility]  Bone density: Her last bone density scan on 09/28/2011 showed a T score of 2.0 (normal).  Lipid  panel: Not on file/[facility]  Allergies  Allergen Reactions  . Cymbalta [Duloxetine Hcl]     Stomach pain  . Maxzide [Hydrochlorothiazide W-Triamterene] Other (See Comments)    Cramping   . Pravastatin Sodium Other (See Comments)    Headache   . Lodine [Etodolac] Itching    Current Outpatient Prescriptions  Medication Sig Dispense Refill  . buPROPion (WELLBUTRIN XL) 150 MG 24 hr tablet Take 150 mg by mouth daily.      . cholecalciferol (VITAMIN D) 1000 UNITS tablet Take 1,000 Units by mouth daily.      . clopidogrel (PLAVIX) 75 MG tablet Take 75 mg by mouth daily.        . fish oil-omega-3 fatty acids 1000 MG capsule Take 1 capsule by mouth 2 (two) times daily.        [name] HYDROcodone-acetaminophen (NORCO) 10-325 MG per tablet Take 1  tablet by mouth every 8 (eight) hours as needed. Pain.      [name] losartan-hydrochlorothiazide (HYZAAR) 100-25 MG per tablet Take 1 tablet by mouth daily.        . niacin (NIASPAN) 500 MG CR tablet Take 500 mg by mouth 2 (two) times daily.       . simvastatin (ZOCOR) 40 MG tablet Take 40 mg by mouth at bedtime.        . tamoxifen (NOLVADEX) 20 MG tablet Take 1 tablet (20 mg total) by mouth daily.  90 tablet  8  . verapamil (VERELAN PM) 240 MG 24 hr capsule Take 240 mg by mouth daily.       [name] albuterol (PROVENTIL HFA;VENTOLIN HFA) 108 (90 BASE) MCG/ACT inhaler Inhale 2 puffs into the lungs every 6 (six) hours as needed for wheezing.      [name] KLOR-CON M20 20 MEQ tablet       . nystatin-triamcinolone (MYCOLOG II) cream Apply 1 application topically 2 (two) times daily as needed (itching).        No current facility-administered medications for this visit.   Facility-Administered Medications Ordered in Other Visits  Medication Dose Route Frequency Provider Last Rate Last Dose  . silver sulfADIAZINE (SILVADENE) 1 % cream   Topical BID Blair Promise, MD   1 application at 81/44/81 8563    OBJECTIVE: Middle-aged African American woman in no acute distress  Filed  Vitals:   12/11/13 0952  BP: 180/71  Pulse: 82  Temp: 98.3 F (36.8 C)  Resp: 18  Blood pressure was repeated manually following our visit today, down to 131/79.    Body mass index is 47.56 kg/(m^2).    ECOG FS: 1 Filed Weights   12/11/13 0952  Weight: 294 lb 8 oz (133.584 kg)   Physical Exam: HEENT:  Sclerae anicteric.  Oropharynx clear and moist. Neck supple, trachea midline.  NODES:  No cervical or supraclavicular lymphadenopathy palpated.  BREAST EXAM:  Right breast is status post lumpectomy, well healed and otherwise benign. Left breast is status post lumpectomy with no suspicious nodularities or skin changes and no evidence of local recurrence. Axillae are benign bilaterally with no palpable lymphadenopathy. LUNGS:  Clear to auscultation bilaterally with good excursion.  No wheezes or rhonchi HEART:  Regular rate and rhythm. No murmur appreciated. ABDOMEN:  Soft, obese, nontender.  Positive bowel sounds.  MSK:  No focal spinal tenderness to palpation. Good range of motion bilaterally in the upper extremities. EXTREMITIES:  Nonpitting edema in the left lower extremity with some tenderness to palpation of the lower leg. No additional peripheral edema is noted, and specifically no upper extremity lymphedema. SKIN:  No visible rashes. No excessive ecchymoses. No petechiae. No pallor. Good skin turgor. NEURO:  Nonfocal. Well oriented.  Appropriate affect.     LAB RESULTS:   Lab Results  Component Value Date   WBC 8.2 12/11/2013   NEUTROABS 6.0 12/11/2013   HGB 11.1* 12/11/2013   HCT 34.7* 12/11/2013   MCV 82.1 12/11/2013   PLT 260 12/11/2013      Chemistry      Component Value Date/Time   NA 144 12/11/2013 0926   NA 141 02/10/2013 2100   K 4.0 12/11/2013 0926   K 3.8 02/10/2013 2100   CL 103 02/10/2013 2100   CL 105 12/09/2012 1423   CO2 25 12/11/2013 0926   CO2 25 02/10/2013 2100   BUN 11.7 12/11/2013 0926   BUN 11 02/10/2013 2100   CREATININE  1.2* 12/11/2013 0926   CREATININE  1.07 02/10/2013 2100      Component Value Date/Time   CALCIUM 10.4 12/11/2013 0926   CALCIUM 10.5 02/10/2013 2100   CALCIUM 9.9 12/09/2012 1423   ALKPHOS 64 12/11/2013 0926   ALKPHOS 63 01/17/2012 1431   AST 9 12/11/2013 0926   AST 11 01/17/2012 1431   ALT 11 12/11/2013 0926   ALT 19 01/17/2012 1431   BILITOT 0.32 12/11/2013 0926   BILITOT 0.3 01/17/2012 1431       STUDIES:  MM Digital Diagnostic Bilat 07/28/2013  CLINICAL DATA: Excisional biopsy in the right breast in 2014 and  left lumpectomy in 2013.  EXAM:  DIGITAL DIAGNOSTIC BILATERAL MAMMOGRAM WITH CAD  COMPARISON: Multiple priors  ACR Breast Density Category b: There are scattered areas of  fibroglandular density.  FINDINGS:  New architectural distortion is identified in the upper posterior  right breast, consistent with interval excisional biopsy. Stable  post lumpectomy changes in the left breast. No suspicious  microcalcifications or new masses.  Mammographic images were processed with CAD.  IMPRESSION:  Benign findings.  RECOMMENDATION:  Diagnostic mammogram is suggested in 1 year. (Code:[id number]-B-01Y)  I have discussed the findings and recommendations with the patient.  Results were also provided in writing at the conclusion of the  visit. If applicable, a reminder letter will be sent to the patient  regarding the next appointment.  BI-RADS CATEGORY 2: Benign Finding(s)  Electronically Signed  By: Donavan Burnet M.D.  On: 07/28/2013 16:01     ASSESSMENT: 68 y.o. [place] woman   (1)  status post left lumpectomy and sentinel lymph node sampling 08/30/2011 for a pT1c pN0, stage IA invasive ductal carcinoma, grade 1, estrogen and progesterone receptor positive, both at 100%, with an MIB-1 of 6%, and no HER-2 amplification.  (2) Oncotype DX score of 3 predicted a distant recurrence within 10 years of 3% if the patient's only adjuvant treatment was tamoxifen for 5 years  (3) Adjuvant! Online would quote her a risk  of recurrence of 16% (including in breast recurrences) dropping to 8% with 5 years of an aromatase inhibitor  (4) Completed adjuvant radiation treatments 11/24/2011  (5)  The patient was unable to take letrozole or anastrozole secondary to concerns regarding jaw pain and constipation.  (5)  Tamoxifen started 10/11/2012   (6) Persistent moderate elevation of the blood calcium level; with elevated  PTH (insurance refused to approve parathyroidectomy).  Labs repeated on 12/11/2013, results pending.  (7) benign right breast lumpectomy 02/06/2013 for suspicious calcifications  PLAN:  Overall, Zooey appears to be doing well, and is tolerating the tamoxifen much better than the aromatase inhibitors she previously tried. I am slightly concerned about the possibility of a clot in her left lower leg since she has had some increased swelling and discomfort care. Accordingly, we are going to obtain a Doppler of the left lower extremity to rule out the possibility of a DVT.  Otherwise, I making no changes to Brandyce's current regimen. She will continue on tamoxifen. Our overall plan will be to continue the tamoxifen for at least 5 years, then consider whether to discontinue the drug or continue to a total of 10 years.   We will continue to follow her calcium levels and PTH as well for her history of hyperparathyroidism.  She is due for her next bone density which we will get scheduled for her. She will see her surgeon, Dr. Ninfa Linden, in August, and will return to see [name] in  January 2016 soon after her repeat mammogram in late December.    The above plan was reviewed in detail with [name] today. She voices her understanding and agreement with this plan, and will call with any changes or problems prior to her next appointment.  Theotis Burrow, PA-C  12/11/2013  1:59 PM

## 2013-12-11 NOTE — Telephone Encounter (Signed)
, °

## 2013-12-11 NOTE — Progress Notes (Signed)
VASCULAR LAB PRELIMINARY  PRELIMINARY  PRELIMINARY  PRELIMINARY  Left lower extremity venous duplex completed.    Preliminary report:  Left:  No evidence of DVT, superficial thrombosis, or Baker's cyst.    [name] [place], RVS 12/11/2013, 3:51 PM

## 2013-12-12 LAB — PTH, INTACT AND CALCIUM
CALCIUM: 10 mg/dL (ref 8.4–10.5)
PTH: 80.1 pg/mL — ABNORMAL HIGH (ref 14.0–72.0)

## 2013-12-15 ENCOUNTER — Telehealth: Payer: Self-pay | Admitting: Physician Assistant

## 2013-12-15 ENCOUNTER — Telehealth: Payer: Self-pay

## 2013-12-15 NOTE — Telephone Encounter (Signed)
Let pt know PTH level much better from last year, calcium normal, and will continue to follow.  Pt voiced understanding.

## 2013-12-15 NOTE — Telephone Encounter (Signed)
, °

## 2014-01-01 ENCOUNTER — Ambulatory Visit
Admission: RE | Admit: 2014-01-01 | Discharge: 2014-01-01 | Disposition: A | Discharge status: Home / Self Care | Payer: Medicare Other | Source: Ambulatory Visit | Attending: Physician Assistant | Admitting: Physician Assistant

## 2014-01-01 DIAGNOSIS — E210 Primary hyperparathyroidism: Secondary | ICD-10-CM

## 2014-01-01 DIAGNOSIS — Z853 Personal history of malignant neoplasm of breast: Secondary | ICD-10-CM

## 2014-01-01 DIAGNOSIS — Z780 Asymptomatic menopausal state: Secondary | ICD-10-CM

## 2014-02-06 ENCOUNTER — Ambulatory Visit (INDEPENDENT_AMBULATORY_CARE_PROVIDER_SITE_OTHER): Payer: Medicare Other | Admitting: Surgery

## 2014-02-06 ENCOUNTER — Encounter (INDEPENDENT_AMBULATORY_CARE_PROVIDER_SITE_OTHER): Payer: Self-pay | Admitting: Surgery

## 2014-02-06 VITALS — BP 127/84 | HR 62 | Temp 98.6°F | Resp 14 | Ht 66.0 in | Wt 295.4 lb

## 2014-02-06 DIAGNOSIS — Z853 Personal history of malignant neoplasm of breast: Secondary | ICD-10-CM

## 2014-02-06 NOTE — Progress Notes (Signed)
Subjective:     Patient ID: [name], female   DOB: [birth year], 68 y.o.   MRN: [id number]  HPI She is here for long-term followup regarding her left breast cancer. She is also actually one year out from biopsy of benign calcifications in the right breast. She is on anti-hormonal therapy. She has no complaints.  Review of Systems     Objective:   Physical Exam On exam, there is no arm swelling on either side. There is no axillary adenopathy in either breast. Both her breast incisions are well healed. There are no palpable masses in either breast.    Assessment:     Patient stable with a history of left breast cancer     Plan:     She will continue her current care. She will be due for mammograms in December. I will see her back in one year unless there are abnormalities on the mammogram.

## 2014-03-05 ENCOUNTER — Ambulatory Visit (INDEPENDENT_AMBULATORY_CARE_PROVIDER_SITE_OTHER): Payer: Medicare Other | Admitting: Surgery

## 2014-03-19 ENCOUNTER — Other Ambulatory Visit: Payer: Self-pay | Admitting: *Deleted

## 2014-03-19 DIAGNOSIS — C50919 Malignant neoplasm of unspecified site of unspecified female breast: Secondary | ICD-10-CM

## 2014-03-19 DIAGNOSIS — C50412 Malignant neoplasm of upper-outer quadrant of left female breast: Secondary | ICD-10-CM

## 2014-03-19 MED ORDER — TAMOXIFEN CITRATE 20 MG PO TABS: 20.0000 mg | ORAL_TABLET | Freq: Every day | ORAL | Status: DC

## 2014-03-23 ENCOUNTER — Encounter: Payer: Self-pay | Admitting: *Deleted

## 2014-03-23 NOTE — Progress Notes (Signed)
Received drug-drug interaction alert from [employer]. Copy to Dr. Jana Hakim, original to be scanned.

## 2014-06-09 ENCOUNTER — Other Ambulatory Visit: Payer: Self-pay | Admitting: Neurosurgery

## 2014-06-09 DIAGNOSIS — M48061 Spinal stenosis, lumbar region without neurogenic claudication: Secondary | ICD-10-CM

## 2014-06-20 ENCOUNTER — Other Ambulatory Visit: Payer: Medicare Other

## 2014-06-20 ENCOUNTER — Ambulatory Visit
Admission: RE | Admit: 2014-06-20 | Discharge: 2014-06-20 | Disposition: A | Discharge status: Home / Self Care | Payer: Medicare Other | Source: Ambulatory Visit | Attending: Neurosurgery | Admitting: Neurosurgery

## 2014-06-20 DIAGNOSIS — M48061 Spinal stenosis, lumbar region without neurogenic claudication: Secondary | ICD-10-CM

## 2014-07-30 ENCOUNTER — Ambulatory Visit
Admission: RE | Admit: 2014-07-30 | Discharge: 2014-07-30 | Disposition: A | Discharge status: Home / Self Care | Payer: Medicare Other | Source: Ambulatory Visit | Attending: Physician Assistant | Admitting: Physician Assistant

## 2014-07-30 DIAGNOSIS — Z853 Personal history of malignant neoplasm of breast: Secondary | ICD-10-CM

## 2014-08-05 ENCOUNTER — Other Ambulatory Visit: Payer: Self-pay | Admitting: *Deleted

## 2014-08-05 DIAGNOSIS — C50412 Malignant neoplasm of upper-outer quadrant of left female breast: Secondary | ICD-10-CM

## 2014-08-06 ENCOUNTER — Other Ambulatory Visit (HOSPITAL_BASED_OUTPATIENT_CLINIC_OR_DEPARTMENT_OTHER): Payer: Medicare Other

## 2014-08-06 DIAGNOSIS — C50419 Malignant neoplasm of upper-outer quadrant of unspecified female breast: Secondary | ICD-10-CM

## 2014-08-06 DIAGNOSIS — C50412 Malignant neoplasm of upper-outer quadrant of left female breast: Secondary | ICD-10-CM

## 2014-08-06 LAB — COMPREHENSIVE METABOLIC PANEL (CC13)
ALT: 11 U/L (ref 0–55)
AST: 9 U/L (ref 5–34)
Albumin: 3.2 g/dL — ABNORMAL LOW (ref 3.5–5.0)
Alkaline Phosphatase: 52 U/L (ref 40–150)
Anion Gap: 11 mEq/L (ref 3–11)
BUN: 14.2 mg/dL (ref 7.0–26.0)
CALCIUM: 9.8 mg/dL (ref 8.4–10.4)
CO2: 26 mEq/L (ref 22–29)
Chloride: 109 mEq/L (ref 98–109)
Creatinine: 1.2 mg/dL — ABNORMAL HIGH (ref 0.6–1.1)
EGFR: 53 mL/min/{1.73_m2} — AB (ref >90)
GLUCOSE: 116 mg/dL (ref 70–140)
Potassium: 4 mEq/L (ref 3.5–5.1)
Sodium: 146 mEq/L — ABNORMAL HIGH (ref 136–145)
Total Bilirubin: 0.41 mg/dL (ref 0.20–1.20)
Total Protein: 6.4 g/dL (ref 6.4–8.3)

## 2014-08-06 LAB — CBC WITH DIFFERENTIAL/PLATELET
BASO%: 0.1 % (ref 0.0–2.0)
Basophils Absolute: 0 10*3/uL (ref 0.0–0.1)
EOS ABS: 0.1 10*3/uL (ref 0.0–0.5)
EOS%: 1.6 % (ref 0.0–7.0)
HCT: 32.7 % — ABNORMAL LOW (ref 34.8–46.6)
HGB: 10.5 g/dL — ABNORMAL LOW (ref 11.6–15.9)
LYMPH%: 25.6 % (ref 14.0–49.7)
MCH: 26.7 pg (ref 25.1–34.0)
MCHC: 32.1 g/dL (ref 31.5–36.0)
MCV: 83.2 fL (ref 79.5–101.0)
MONO#: 0.5 10*3/uL (ref 0.1–0.9)
MONO%: 7.8 % (ref 0.0–14.0)
NEUT%: 64.9 % (ref 38.4–76.8)
NEUTROS ABS: 4.4 10*3/uL (ref 1.5–6.5)
Platelets: 205 10*3/uL (ref 145–400)
RBC: 3.93 10*6/uL (ref 3.70–5.45)
RDW: 15.3 % — AB (ref 11.2–14.5)
WBC: 6.8 10*3/uL (ref 3.9–10.3)
lymph#: 1.7 10*3/uL (ref 0.9–3.3)

## 2014-08-13 ENCOUNTER — Ambulatory Visit (HOSPITAL_BASED_OUTPATIENT_CLINIC_OR_DEPARTMENT_OTHER): Payer: Medicare Other | Admitting: Oncology

## 2014-08-13 ENCOUNTER — Telehealth: Payer: Self-pay | Admitting: Oncology

## 2014-08-13 VITALS — BP 181/108 | HR 90 | Temp 98.9°F | Resp 20 | Ht 66.0 in | Wt 295.6 lb

## 2014-08-13 DIAGNOSIS — M4800 Spinal stenosis, site unspecified: Secondary | ICD-10-CM

## 2014-08-13 DIAGNOSIS — Z170 Estrogen receptor positive status [ER+]: Secondary | ICD-10-CM

## 2014-08-13 DIAGNOSIS — E210 Primary hyperparathyroidism: Secondary | ICD-10-CM

## 2014-08-13 DIAGNOSIS — D352 Benign neoplasm of pituitary gland: Secondary | ICD-10-CM | POA: Insufficient documentation

## 2014-08-13 DIAGNOSIS — I10 Essential (primary) hypertension: Secondary | ICD-10-CM

## 2014-08-13 DIAGNOSIS — G4733 Obstructive sleep apnea (adult) (pediatric): Secondary | ICD-10-CM

## 2014-08-13 DIAGNOSIS — C50412 Malignant neoplasm of upper-outer quadrant of left female breast: Secondary | ICD-10-CM

## 2014-08-13 NOTE — Telephone Encounter (Signed)
, °

## 2014-08-13 NOTE — Progress Notes (Signed)
ID: [name]   DOB: 30-Sep-1945  MR#: [id number]  JIR#:[id number]  PCP: Shirline Frees, MD GYN: Gus Height, MD SU: Coralie Keens, MD OTHER MD: Gery Pray, MD; Everlean Cherry  CHIEF COMPLAINT:  Hx of Left Breast Cancer  CURRENT TREATMENT: Tamoxifen   HISTORY OF PRESENT ILLNESS: From the original intake note:  [name] had routine screening mammography December 2012 showing a possible mass in the left breast. This was confirmed with left diagnostic mammography 07/13/2011, with an ultrasound that day showing a hypoechoic mass measuring 6 mm in the upper outer quadrant of the left breast. Biopsy of this mass 08/02/2011 showed (SAA 13-19) and invasive ductal carcinoma, which was estrogen receptor 100% positive, progesterone receptor 100% positive, with an MIB-1 of 6%, and no HER-2 amplification.  Breast MRI 08/08/2011 showed a solitary 1.2 cm mass in the upper outer quadrant of the left breast, and the patient underwent definitive left lumpectomy 08/30/2011 for a 1.2 cm invasive ductal carcinoma, grade 1, with all 3 sentinel lymph nodes clear. That he was evaluated by Dr. Eston Esters, and after her radiation treatments was completed he tried her on a rim index initially, then letrozole. The patient was unable to tolerate those medications.   The patient's subsequent history is as detailed below  INTERVAL HISTORY: [name] returns today for follow-up of her breast cancer. Interval history is significant for her having developed spinal stenosis. This is being followed by Dr. Garnette Czech and she is considering surgery. As far as breast cancer is concerned, she continues on tamoxifen. She tolerates this well except for some nighttime hot flashes. She would not want to take any medication to reduce this: "I am ready on a lot of medicines".  REVIEW OF SYSTEMS: [name] is working part-time, 4 hours 3 days a week and there is a lot of walking associated with the job. Otherwise she is not exercising  regularly. She has had recurrent upper respiratory problems and has had 2 or 3 rounds of antibiotics with no relief. She has seen Dr. Peter Minium the past and is planning to return to him for further evaluation. Aside from pain in her back, which she describes as crampy, she has some hoarseness, and a bit of a dry cough. She is short of breath with activity, particularly walking up  steps or up a slope. She feels forgetful and anxious, and a little bit depressed. She has been on Norco for long time for her pain. She does get somewhat constipated from this. A detailed review of systems today was otherwise noncontributory   PAST MEDICAL HISTORY: Past Medical History  Diagnosis Date  . OSA (obstructive sleep apnea)   . Hypertension   . Hyperlipidemia   . Headache(784.0)   . Blood transfusion   . Pneumonia 4/12  . Anemia   . Anxiety   . Depression     Dr.Lewit  . Diverticulosis   . Radiation 10/29/11-11/24/11    left breast 6100 cGy  . Tinnitus   . History of colon polyps 1998    adenomatous  . Spinal stenosis   . Bronchitis     hx of  . Shortness of breath     with exertion  . Arthritis   . Breast cancer 2013    left breast    PAST SURGICAL HISTORY: Past Surgical History  Procedure Laterality Date  . Total knee arthroplasty  2006    bilateral  . Polypectomy  1990's  . Appendectomy  1978  . Thyroid cyst excision  1994  . Transphenoidal / transnasal hypophysectomy / resection pituitary tumor  6/11  . Belpharoptosis repair    . Breast lumpectomy  08/2011    left  . Joint replacement Bilateral     bilateral knee  . Colonoscopy w/ polypectomy    . Breast lumpectomy with needle localization Right 02/06/2013    Procedure: RIGHT BREAST LUMPECTOMY WITH NEEDLE LOCALIZATION;  Surgeon: Harl Bowie, MD;  Location: [facility];  Service: General;  Laterality: Right;    FAMILY HISTORY Family History  Problem Relation Age of Onset  . Heart disease Father   . Clotting disorder  Father   . Stroke Father   . Stroke Mother   . Diabetes Mother   . Colon cancer Neg Hx   . Cancer Sister 17    Breast Cancer  The patient's father died at the age of 83 from a ruptured aneurysm (the patient is not sure if this was cerebral or thoracic). The patient's mother died at the age of 64 from a stroke. The patient had one brother and one sister. Her sister has recently been diagnosed with breast cancer at the age of 78. There is no other history of breast or ovarian cancer in the family.  GYNECOLOGIC HISTORY:  (reviewed 12/11/2013) Menarche age 16, first live birth age 39, the patient is GX P2, change of life at age 18. She never took hormone replacement.  SOCIAL HISTORY:  (updated 12/11/2013) She is retired from a clerical work Designer, jewellery. She has been married 52 years. Her husband, Shanon Brow, owned his own [employer] and is retired. Daughter Coralyn Mark works for [employer] here in [place]. Son Jenetta Loges works as a Designer, industrial/product in [employer]. The patient has 3 grandchildren and 1 great granddaughter. She attends a local [place]: Not on file  HEALTH MAINTENANCE:  (updated 12/11/2013) History  Substance Use Topics  . Smoking status: Never Smoker   . Smokeless tobacco: Never Used  . Alcohol Use: No     Colonoscopy: September 2006, normal (to be repeated in 2016) /[facility]   PAP:  January 2015/[facility]  Bone density: Her last bone density scan on 09/28/2011 showed a T score of 2.0 (normal).  Lipid panel: Per Dr. Kenton Kingfisher  Allergies  Allergen Reactions  . Cymbalta [Duloxetine Hcl]     Stomach pain  . Maxzide [Hydrochlorothiazide W-Triamterene] Other (See Comments)    Cramping   . Pravastatin Sodium Other (See Comments)    Headache   . Lodine [Etodolac] Itching    Current Outpatient Prescriptions  Medication Sig Dispense Refill  . albuterol (PROVENTIL HFA;VENTOLIN HFA) 108 (90 BASE) MCG/ACT inhaler Inhale 2 puffs into the lungs every 6 (six) hours as needed  for wheezing.    [name] buPROPion (WELLBUTRIN XL) 150 MG 24 hr tablet Take 150 mg by mouth daily.    . cholecalciferol (VITAMIN D) 1000 UNITS tablet Take 1,000 Units by mouth daily.    . clopidogrel (PLAVIX) 75 MG tablet Take 75 mg by mouth daily.      . fish oil-omega-3 fatty acids 1000 MG capsule Take 1 capsule by mouth 2 (two) times daily.      [name] HYDROcodone-acetaminophen (NORCO) 10-325 MG per tablet Take 1 tablet by mouth every 8 (eight) hours as needed. Pain.    [name] KLOR-CON M20 20 MEQ tablet     . losartan-hydrochlorothiazide (HYZAAR) 100-25 MG per tablet Take 1 tablet by mouth daily.      . niacin (NIASPAN) 500 MG CR  tablet Take 500 mg by mouth 2 (two) times daily.     [name] nystatin-triamcinolone (MYCOLOG II) cream Apply 1 application topically 2 (two) times daily as needed (itching).     . simvastatin (ZOCOR) 40 MG tablet Take 40 mg by mouth at bedtime.      . tamoxifen (NOLVADEX) 20 MG tablet Take 1 tablet (20 mg total) by mouth daily. 90 tablet 1  . verapamil (VERELAN PM) 240 MG 24 hr capsule Take 240 mg by mouth daily.      No current facility-administered medications for this visit.   Facility-Administered Medications Ordered in Other Visits  Medication Dose Route Frequency Provider Last Rate Last Dose  . silver sulfADIAZINE (SILVADENE) 1 % cream   Topical BID Blair Promise, MD   1 application at 17/00/17 4944    OBJECTIVE: Middle-aged African American woman in no acute distress   Filed Vitals:   08/13/14 0930  BP: 181/108  Pulse: 90  Temp: 98.9 F (37.2 C)  Resp: 20   Blood pressure was obtained with an inappropriately sized cuff. Repeat showed a diastolic of 92.   Body mass index is 47.73 kg/(m^2).    ECOG FS: 2 Filed Weights   08/13/14 0930  Weight: 295 lb 9.6 oz (134.083 kg)   Sclerae unicteric, pupils round and equal Oropharynx clear and moist No cervical or supraclavicular adenopathy Lungs no rales or rhonchi Heart regular rate and rhythm Abd soft, obese,  nontender, positive bowel sounds MSK no focal spinal tenderness, no upper extremity lymphedema Neuro: nonfocal, well oriented, appropriate affect Breasts: The right breast is unremarkable. The left breast is status post lumpectomy and radiation. There is no evidence of local recurrence per the left axilla is benign   LAB RESULTS:   Lab Results  Component Value Date   WBC 6.8 08/06/2014   NEUTROABS 4.4 08/06/2014   HGB 10.5* 08/06/2014   HCT 32.7* 08/06/2014   MCV 83.2 08/06/2014   PLT 205 08/06/2014      Chemistry      Component Value Date/Time   NA 146* 08/06/2014 0810   NA 141 02/10/2013 2100   K 4.0 08/06/2014 0810   K 3.8 02/10/2013 2100   CL 103 02/10/2013 2100   CL 105 12/09/2012 1423   CO2 26 08/06/2014 0810   CO2 25 02/10/2013 2100   BUN 14.2 08/06/2014 0810   BUN 11 02/10/2013 2100   CREATININE 1.2* 08/06/2014 0810   CREATININE 1.07 02/10/2013 2100      Component Value Date/Time   CALCIUM 9.8 08/06/2014 0810   CALCIUM 10.0 12/11/2013 0926   CALCIUM 9.9 12/09/2012 1423   ALKPHOS 52 08/06/2014 0810   ALKPHOS 63 01/17/2012 1431   AST 9 08/06/2014 0810   AST 11 01/17/2012 1431   ALT 11 08/06/2014 0810   ALT 19 01/17/2012 1431   BILITOT 0.41 08/06/2014 0810   BILITOT 0.3 01/17/2012 1431       STUDIES: Mm Digital Diagnostic Bilat  07/30/2014   CLINICAL DATA:  History of left breast carcinoma. Lumpectomy performed in January 2013. Patient has some shooting pains in the left breast but no other complaints.  EXAM: DIGITAL DIAGNOSTIC  BILATERAL MAMMOGRAM WITH CAD  COMPARISON:  Prior exams  ACR Breast Density Category b: There are scattered areas of fibroglandular density.  FINDINGS: There is a persistent seroma with associated architectural distortion in the lateral left breast, unchanged from December 2014. An area of distortion is noted in the right breast reflecting a benign  excision site and postsurgical scarring. Several circumscribed benign-appearing masses  in the left breast are stable from prior exams. There are no new or suspicious masses, no other areas of architectural distortion and no suspicious calcifications.  Mammographic images were processed with CAD.  IMPRESSION: No evidence of recurrent or new breast malignancy. Benign postsurgical changes bilaterally.  RECOMMENDATION: Diagnostic mammography in 1 year per standard post lumpectomy protocol.  I have discussed the findings and recommendations with the patient. Results were also provided in writing at the conclusion of the visit. If applicable, a reminder letter will be sent to the patient regarding the next appointment.  BI-RADS CATEGORY  2: Benign Finding(s)   Electronically Signed   By: Lajean Manes M.D.   On: 07/30/2014 09:35     ASSESSMENT: 69 y.o. [place] woman   (1)  status post left lumpectomy and sentinel lymph node sampling 08/30/2011 for a pT1c pN0, stage IA invasive ductal carcinoma, grade 1, estrogen and progesterone receptor positive, both at 100%, with an MIB-1 of 6%, and no HER-2 amplification.  (2) Oncotype DX score of 3 predicted a distant recurrence within 10 years of 3% if the patient's only adjuvant treatment was tamoxifen for 5 years  (3) Adjuvant! Online would quote her a risk of recurrence of 16% (including in breast recurrences) dropping to 8% with 5 years of an aromatase inhibitor  (4) Completed adjuvant radiation treatments 11/24/2011  (5)  The patient was unable to take letrozole or anastrozole secondary to concerns regarding jaw pain and constipation.  (5)  Tamoxifen started 10/11/2012   (6) Persistent moderate elevation of the blood calcium level; with elevated  PTH (insurance refused to approve parathyroidectomy).   (7) benign right breast lumpectomy 02/06/2013 for suspicious calcifications  PLAN:   [name] is doing well from a breast cancer point of view, and the plan is to continue tamoxifen for an additional 3 years.  She is contemplating surgery  for spinal stenosis, which is becoming more symptomatic. She is concerned about her lungs not working well. She has seen Dr. clams in the past and is planning to go back to seeing him before having any surgery.  Today I gave her a copy of the [employer] pamphlet and encouraged her to join. This would be a steadily increasing exercise treatment which would not injure her and might allow her to do better both in general and with her upcoming surgeries.  Otherwise she will return to see [name] again in 8 months. She knows to call for any problems that may develop before that visit.  Chauncey Cruel, MD  08/13/2014  9:53 AM

## 2014-08-17 NOTE — Progress Notes (Signed)
[name], apparently this pt is having pulmonary issues and is supposed to be coming to see me in the future.  I have only seen her for sleep apnea, and she has seen Dr. Joya Gaskins in the distant past for pulmonary . If she is going to see me for pulmonary issues, will need to be in 67min slot and will be a new pulmonary consult.

## 2014-08-20 NOTE — Progress Notes (Signed)
I have called pt [name] 4 and left messages. She is not scheduled with [facility] for visit. Pt has not returned any of my calls. Will make [facility] aware.

## 2014-09-14 ENCOUNTER — Other Ambulatory Visit: Payer: Self-pay | Admitting: Oncology

## 2014-09-14 DIAGNOSIS — C50412 Malignant neoplasm of upper-outer quadrant of left female breast: Secondary | ICD-10-CM

## 2014-10-28 ENCOUNTER — Emergency Department (HOSPITAL_COMMUNITY): Payer: Medicare Other

## 2014-10-28 ENCOUNTER — Encounter (HOSPITAL_COMMUNITY): Payer: Self-pay | Admitting: *Deleted

## 2014-10-28 ENCOUNTER — Emergency Department (HOSPITAL_COMMUNITY)
Admission: EM | Admit: 2014-10-28 | Discharge: 2014-10-28 | Disposition: A | Discharge status: Home / Self Care | Payer: Medicare Other | Attending: Emergency Medicine | Admitting: Emergency Medicine

## 2014-10-28 DIAGNOSIS — Z8709 Personal history of other diseases of the respiratory system: Secondary | ICD-10-CM | POA: Diagnosis not present

## 2014-10-28 DIAGNOSIS — Z8639 Personal history of other endocrine, nutritional and metabolic disease: Secondary | ICD-10-CM | POA: Diagnosis not present

## 2014-10-28 DIAGNOSIS — S4991XA Unspecified injury of right shoulder and upper arm, initial encounter: Secondary | ICD-10-CM | POA: Diagnosis present

## 2014-10-28 DIAGNOSIS — Z853 Personal history of malignant neoplasm of breast: Secondary | ICD-10-CM | POA: Insufficient documentation

## 2014-10-28 DIAGNOSIS — Z79899 Other long term (current) drug therapy: Secondary | ICD-10-CM | POA: Diagnosis not present

## 2014-10-28 DIAGNOSIS — Y998 Other external cause status: Secondary | ICD-10-CM | POA: Insufficient documentation

## 2014-10-28 DIAGNOSIS — Y9289 Other specified places as the place of occurrence of the external cause: Secondary | ICD-10-CM | POA: Diagnosis not present

## 2014-10-28 DIAGNOSIS — Z7902 Long term (current) use of antithrombotics/antiplatelets: Secondary | ICD-10-CM | POA: Diagnosis not present

## 2014-10-28 DIAGNOSIS — X58XXXA Exposure to other specified factors, initial encounter: Secondary | ICD-10-CM | POA: Diagnosis not present

## 2014-10-28 DIAGNOSIS — F329 Major depressive disorder, single episode, unspecified: Secondary | ICD-10-CM | POA: Diagnosis not present

## 2014-10-28 DIAGNOSIS — Z8701 Personal history of pneumonia (recurrent): Secondary | ICD-10-CM | POA: Insufficient documentation

## 2014-10-28 DIAGNOSIS — I10 Essential (primary) hypertension: Secondary | ICD-10-CM | POA: Diagnosis not present

## 2014-10-28 DIAGNOSIS — S43014A Anterior dislocation of right humerus, initial encounter: Secondary | ICD-10-CM | POA: Diagnosis not present

## 2014-10-28 DIAGNOSIS — Y9301 Activity, walking, marching and hiking: Secondary | ICD-10-CM | POA: Diagnosis not present

## 2014-10-28 DIAGNOSIS — Z8719 Personal history of other diseases of the digestive system: Secondary | ICD-10-CM | POA: Insufficient documentation

## 2014-10-28 DIAGNOSIS — Z862 Personal history of diseases of the blood and blood-forming organs and certain disorders involving the immune mechanism: Secondary | ICD-10-CM | POA: Insufficient documentation

## 2014-10-28 DIAGNOSIS — Z86010 Personal history of colonic polyps: Secondary | ICD-10-CM | POA: Diagnosis not present

## 2014-10-28 MED ORDER — KETAMINE HCL 10 MG/ML IJ SOLN
INTRAMUSCULAR | Status: AC | PRN
  Administered 2014-10-28: 90 mg via INTRAVENOUS

## 2014-10-28 MED ORDER — ONDANSETRON HCL 4 MG/2ML IJ SOLN
4.0000 mg | Freq: Once | INTRAMUSCULAR | Status: AC
  Administered 2014-10-28: 4 mg via INTRAVENOUS
  Filled 2014-10-28: qty 2

## 2014-10-28 MED ORDER — KETAMINE HCL 10 MG/ML IJ SOLN
1.0000 mg/kg | Freq: Once | INTRAMUSCULAR | Status: AC
  Administered 2014-10-28: 133 mg via INTRAVENOUS
  Filled 2014-10-28: qty 13.3

## 2014-10-28 MED ORDER — HYDROMORPHONE HCL 1 MG/ML IJ SOLN
1.0000 mg | Freq: Once | INTRAMUSCULAR | Status: AC
  Administered 2014-10-28: 1 mg via INTRAVENOUS
  Filled 2014-10-28: qty 1

## 2014-10-28 MED ORDER — OXYCODONE-ACETAMINOPHEN 5-325 MG PO TABS: 1.0000 | ORAL_TABLET | ORAL | Status: DC | PRN

## 2014-10-28 NOTE — ED Provider Notes (Signed)
CSN: [id number]     Arrival date & time 10/28/14  1327 History   First MD Initiated Contact with Patient 10/28/14 1344     Chief Complaint  Patient presents with  . Shoulder Injury     (Consider location/radiation/quality/duration/timing/severity/associated sxs/prior Treatment) HPI   69 year old female right shoulder pain. Acute onset shortly before arrival. Patient lost her balance and fell onto her side. Severe persistent right shoulder pain since then. Does not think she hit her head. Denies acute pain anywhere else. No numbness or tingling. No history of prior issues with the same shoulder.    Past Medical History  Diagnosis Date  . OSA (obstructive sleep apnea)   . Hypertension   . Hyperlipidemia   . Headache(784.0)   . Blood transfusion   . Pneumonia 4/12  . Anemia   . Anxiety   . Depression     Dr.Lewit  . Diverticulosis   . Radiation 10/29/11-11/24/11    left breast 6100 cGy  . Tinnitus   . History of colon polyps 1998    adenomatous  . Spinal stenosis   . Bronchitis     hx of  . Shortness of breath     with exertion  . Arthritis   . Breast cancer 2013    left breast   Past Surgical History  Procedure Laterality Date  . Total knee arthroplasty  2006    bilateral  . Polypectomy  1990's  . Appendectomy  1978  . Thyroid cyst excision  1994  . Transphenoidal / transnasal hypophysectomy / resection pituitary tumor  6/11  . Belpharoptosis repair    . Breast lumpectomy  08/2011    left  . Joint replacement Bilateral     bilateral knee  . Colonoscopy w/ polypectomy    . Breast lumpectomy with needle localization Right 02/06/2013    Procedure: RIGHT BREAST LUMPECTOMY WITH NEEDLE LOCALIZATION;  Surgeon: Harl Bowie, MD;  Location: [facility];  Service: General;  Laterality: Right;   Family History  Problem Relation Age of Onset  . Heart disease Father   . Clotting disorder Father   . Stroke Father   . Stroke Mother   . Diabetes Mother   . Colon cancer  Neg Hx   . Cancer Sister 42    Breast Cancer   History  Substance Use Topics  . Smoking status: Never Smoker   . Smokeless tobacco: Never Used  . Alcohol Use: No   OB History    No data available     Review of Systems  All systems reviewed and negative, other than as noted in HPI.   Allergies  Cymbalta; Maxzide; Pravastatin sodium; and Lodine  Home Medications   Prior to Admission medications   Medication Sig Start Date End Date Taking? Authorizing Provider  albuterol (PROVENTIL HFA;VENTOLIN HFA) 108 (90 BASE) MCG/ACT inhaler Inhale 2 puffs into the lungs every 6 (six) hours as needed for wheezing.   Yes Historical Provider, MD  buPROPion (WELLBUTRIN XL) 150 MG 24 hr tablet Take 150 mg by mouth daily.   Yes Historical Provider, MD  cholecalciferol (VITAMIN D) 1000 UNITS tablet Take 1,000 Units by mouth daily.   Yes Historical Provider, MD  clopidogrel (PLAVIX) 75 MG tablet Take 75 mg by mouth daily.     Yes Historical Provider, MD  fish oil-omega-3 fatty acids 1000 MG capsule Take 1 capsule by mouth 2 (two) times daily.     Yes Historical Provider, MD  HYDROcodone-acetaminophen (NORCO) 10-325 MG per  tablet Take 1 tablet by mouth every 8 (eight) hours as needed. Pain.   Yes Historical Provider, MD  Multiple Vitamins-Minerals (ICAPS AREDS FORMULA PO) Take 1 tablet by mouth at bedtime and may repeat dose one time if needed.   Yes Historical Provider, MD  niacin (NIASPAN) 500 MG CR tablet Take 500 mg by mouth 2 (two) times daily.    Yes Historical Provider, MD  tamoxifen (NOLVADEX) 20 MG tablet Take 1 tablet by mouth  daily 09/14/14  Yes Chauncey Cruel, MD  Tapentadol HCl 100 MG TB12 Take 1 tablet by mouth 2 (two) times daily.   Yes Historical Provider, MD  verapamil (VERELAN PM) 240 MG 24 hr capsule Take 240 mg by mouth daily.    Yes Historical Provider, MD   BP 144/75 mmHg  Pulse 82  Temp(Src) 98.1 F (36.7 C) (Oral)  Resp 24  SpO2 99% Physical Exam  Constitutional: She  appears well-developed and well-nourished. No distress.  HENT:  Head: Normocephalic and atraumatic.  Eyes: Conjunctivae are normal. Right eye exhibits no discharge. Left eye exhibits no discharge.  Neck: Neck supple.  Cardiovascular: Normal rate, regular rhythm and normal heart sounds.  Exam reveals no gallop and no friction rub.   No murmur heard. Pulmonary/Chest: Effort normal and breath sounds normal. No respiratory distress.  Abdominal: Soft. She exhibits no distension. There is no tenderness.  Musculoskeletal:  Examination by body habitus. No obvious deformity. Severe pain with palpation of the humeral head and patient resists any attempts at range of motion. Closed injury. Neurovascular intact distally.  Neurological: She is alert.  Skin: Skin is warm and dry.  Psychiatric: She has a normal mood and affect. Her behavior is normal. Thought content normal.  Nursing note and vitals reviewed.   ED Course  Reduction of dislocation Date/Time: 10/28/2014 3:45 PM Performed by: Virgel Manifold Authorized by: Virgel Manifold Consent: Verbal consent obtained. Written consent not obtained. Risks and benefits: risks, benefits and alternatives were discussed Consent given by: patient Time out: Immediately prior to procedure a "time out" was called to verify the correct patient, procedure, equipment, support staff and site/side marked as required. Local anesthesia used: no Patient sedated: yes Sedation type: moderate (conscious) sedation Sedatives: ketamine Analgesia: hydromorphone Vitals: Vital signs were monitored during sedation. Patient tolerance: Patient tolerated the procedure well with no immediate complications Comments: Reduced with external rotation and placed in sling.    (including critical care time)  Pre procedure:  Pre-anesthesia/induction confirmation of laterality/correct procedure site including "time-out."  Provider confirms review of the nurses' note, allergies,  medications, pertinent labs, PMH, pre-induction vital signs, pulse oximetry, pain level, and ECG (as applicable), and patient condition satisfactory for commencing with order for sedation and procedure.  Medications: ketamine IV (see MAR)  Post procedure:   Patient tolerated procedure and procedural sedation component as expected without apparent immediate complications.  Physician confirms procedural medication orders as administered, patient was assessed by physician post-procedure, and confirms post-sedation plan of care and disposition.  Time of sedation/monitoring: 30 minutes   Labs Review Labs Reviewed - No data to display  Imaging Review No results found.   EKG Interpretation None      MDM   Final diagnoses:  Anterior shoulder dislocation, right, initial encounter    69 year old female with closed anterior right shoulder dislocation. Reduced. Remains Neurovascularly intact. Sling. Orthopedic follow-up.    Virgel Manifold, MD 10/31/14 0730

## 2014-10-28 NOTE — Progress Notes (Signed)
CSW met with pt at bedside. Husband and friend were present.   Patient confirms that she presents to [facility] due to slipping today. Per note, pt caught herself with her right arm and was found on the ground with arm extended on arrival.[place].   Patient informed CSW that she lives at home with her husband in [place]. Husband states that he is the pt's primary support and he also states that they have a daughter in [place] who is able to help pt. Patient informed CSW that she can complete her ADL's independently. Also, she states that she has not fallen within the past 6 months. She states that she does not fall often. Patient informed CSW that she has a surgery scheduled at [facility] November 16, 2014 for her back.  Patient and husband did not express any concerns nor do they have any questions.  Sheala Dosh [phone]  Willette Brace [phone] ED CSW 10/28/2014 9:22 PM

## 2014-10-28 NOTE — Discharge Instructions (Signed)
Shoulder Dislocation  Your shoulder is made up of three bones: the collar bone (clavicle); the shoulder blade (scapula), which includes the socket (glenoid cavity); and the upper arm bone (humerus). Your shoulder joint is the place where these bones meet. Strong, fibrous tissues hold these bones together (ligaments). Muscles and strong, fibrous tissues that connect the muscles to these bones (tendons) allow your arm to move through this joint. The range of motion of your shoulder joint is more extensive than most of your other joints, and the glenoid cavity is very shallow. That is the reason that your shoulder joint is one of the most unstable joints in your body. It is far more prone to dislocation than your other joints. Shoulder dislocation is when your humerus is forced out of your shoulder joint.  CAUSES  Shoulder dislocation is caused by a forceful impact on your shoulder. This impact usually is from an injury, such as a sports injury or a fall.  SYMPTOMS  Symptoms of shoulder dislocation include:  · Deformity of your shoulder.  · Intense pain.  · Inability to move your shoulder joint.  · Numbness, weakness, or tingling around your shoulder joint (your neck or down your arm).  · Bruising or swelling around your shoulder.  DIAGNOSIS  In order to diagnose a dislocated shoulder, your caregiver will perform a physical exam. Your caregiver also may have an X-ray exam done to see if you have any broken bones. Magnetic resonance imaging (MRI) is a procedure that sometimes is done to help your caregiver see any damage to the soft tissues around your shoulder, particularly your rotator cuff tendons. Additionally, your caregiver also may have electromyography done to measure the electrical discharges produced in your muscles if you have signs or symptoms of nerve damage.  TREATMENT  A shoulder dislocation is treated by placing the humerus back in the joint (reduction). Your caregiver does this either manually (closed  reduction), by moving your humerus back into the joint through manipulation, or through surgery (open reduction). When your humerus is back in place, severe pain should improve almost immediately.  You also may need to have surgery if you have a weak shoulder joint or ligaments, and you have recurring shoulder dislocations, despite rehabilitation. In rare cases, surgery is necessary if your nerves or blood vessels are damaged during the dislocation.  After your reduction, your arm will be placed in a shoulder immobilizer or sling to keep it from moving. Your caregiver will have you wear your shoulder immobilizer or sling for 3 days to 3 weeks, depending on how serious your dislocation is. When your shoulder immobilizer or sling is removed, your caregiver may prescribe physical therapy to help improve the range of motion in your shoulder joint.  HOME CARE INSTRUCTIONS   The following measures can help to reduce pain and speed up the healing process:  · Rest your injured joint. Do not move it. Avoid activities similar to the one that caused your injury.  · Apply ice to your injured joint for the first day or two after your reduction or as directed by your caregiver. Applying ice helps to reduce inflammation and pain.  ¨ Put ice in a plastic bag.  ¨ Place a towel between your skin and the bag.  ¨ Leave the ice on for 15-20 minutes at a time, every 2 hours while you are awake.  · Exercise your hand by squeezing a soft ball. This helps to eliminate stiffness and swelling in your hand and   wrist.  · Take over-the-counter or prescription medicine for pain or discomfort as told by your caregiver.  SEEK IMMEDIATE MEDICAL CARE IF:   · Your shoulder immobilizer or sling becomes damaged.  · Your pain becomes worse rather than better.  · You lose feeling in your arm or hand, or they become white and cold.  MAKE SURE YOU:   · Understand these instructions.  · Will watch your condition.  · Will get help right away if you are not  doing well or get worse.  Document Released: 04/11/2001 Document Revised: 12/01/2013 Document Reviewed: 05/07/2011  Exit[employer]® Patient Information ©2015 [employer]. This information is not intended to replace advice given to you by your health care provider. Make sure you discuss any questions you have with your health care provider.

## 2014-10-28 NOTE — ED Notes (Signed)
Bed: WA16 Expected date:  Expected time:  Means of arrival:  Comments: EMS fall 

## 2014-10-28 NOTE — ED Notes (Signed)
Patient transported to X-ray 

## 2014-10-28 NOTE — ED Notes (Signed)
Dr. Wilson Singer took off arm splint at bedside and patient repositioned. Backboard given back to EMS.

## 2014-10-28 NOTE — ED Notes (Signed)
Pt given discharge instructions and all questions answered. Pain rx given to patient. Family and patient educated on use of sling and need to arrange follow up appointment with orthopedic MD.

## 2014-10-28 NOTE — ED Notes (Signed)
Per EMS, pt was walking into bank went she slipped on step. Pt caught herself with her right arm and was found on the ground with arm extended on arrival. 20g in left hand. 150 mcg fentanyl given before arrival. BP WNL. Right shoulder in splint and raised above arm. Vascular status of right arm intact.

## 2014-10-30 ENCOUNTER — Other Ambulatory Visit: Payer: Self-pay | Admitting: Orthopedic Surgery

## 2014-10-30 DIAGNOSIS — R52 Pain, unspecified: Secondary | ICD-10-CM

## 2014-11-17 ENCOUNTER — Ambulatory Visit
Admission: RE | Admit: 2014-11-17 | Discharge: 2014-11-17 | Disposition: A | Discharge status: Home / Self Care | Payer: Medicare Other | Source: Ambulatory Visit | Attending: Orthopedic Surgery | Admitting: Orthopedic Surgery

## 2014-11-17 DIAGNOSIS — R52 Pain, unspecified: Secondary | ICD-10-CM

## 2014-12-02 ENCOUNTER — Other Ambulatory Visit: Payer: Self-pay | Admitting: Neurosurgery

## 2014-12-23 ENCOUNTER — Encounter (HOSPITAL_COMMUNITY)
Admission: RE | Admit: 2014-12-23 | Discharge: 2014-12-23 | Disposition: A | Discharge status: Home / Self Care | Payer: Medicare Other | Source: Ambulatory Visit | Attending: Neurosurgery | Admitting: Neurosurgery

## 2014-12-23 ENCOUNTER — Encounter (HOSPITAL_COMMUNITY): Payer: Self-pay

## 2014-12-23 DIAGNOSIS — M4310 Spondylolisthesis, site unspecified: Secondary | ICD-10-CM | POA: Insufficient documentation

## 2014-12-23 DIAGNOSIS — Z01818 Encounter for other preprocedural examination: Secondary | ICD-10-CM | POA: Insufficient documentation

## 2014-12-23 LAB — CBC
HEMATOCRIT: 34.4 % — AB (ref 36.0–46.0)
Hemoglobin: 11.1 g/dL — ABNORMAL LOW (ref 12.0–15.0)
MCH: 25.6 pg — ABNORMAL LOW (ref 26.0–34.0)
MCHC: 32.3 g/dL (ref 30.0–36.0)
MCV: 79.3 fL (ref 78.0–100.0)
Platelets: 273 10*3/uL (ref 150–400)
RBC: 4.34 MIL/uL (ref 3.87–5.11)
RDW: 15.4 % (ref 11.5–15.5)
WBC: 9.5 10*3/uL (ref 4.0–10.5)

## 2014-12-23 LAB — SURGICAL PCR SCREEN
MRSA, PCR: NEGATIVE
Staphylococcus aureus: NEGATIVE

## 2014-12-23 LAB — BASIC METABOLIC PANEL
Anion gap: 12 (ref 5–15)
BUN: 11 mg/dL (ref 6–20)
CO2: 24 mmol/L (ref 22–32)
CREATININE: 1.02 mg/dL — AB (ref 0.44–1.00)
Calcium: 10.2 mg/dL (ref 8.9–10.3)
Chloride: 104 mmol/L (ref 101–111)
GFR calc Af Amer: >60 mL/min (ref >60)
GFR calc non Af Amer: 54 mL/min — ABNORMAL LOW (ref >60)
Glucose, Bld: 116 mg/dL — ABNORMAL HIGH (ref 65–99)
Potassium: 3.7 mmol/L (ref 3.5–5.1)
SODIUM: 140 mmol/L (ref 135–145)

## 2014-12-23 LAB — TYPE AND SCREEN
ABO/RH(D): A POS
ANTIBODY SCREEN: NEGATIVE

## 2014-12-23 NOTE — Progress Notes (Signed)
Pt denies SOB and chest pain currently and denies being under the care of a cardiologist. Pt stated that her last dose of Plavix was taken on 5/24 despite being instructed to stop 5 days prior to procedure. Pt stated that she had a chest x ray done at [facility]) in March; records requested.

## 2014-12-25 NOTE — Progress Notes (Signed)
No CXR noted in care everywhere. Requested Last office visit and lumbar Xray noted in pt chart and in care everywhere.

## 2014-12-28 MED ORDER — DEXTROSE 5 % IV SOLN
3.0000 g | INTRAVENOUS | Status: AC
  Administered 2014-12-29: 3 g via INTRAVENOUS
  Filled 2014-12-28: qty 3000

## 2014-12-28 MED ORDER — DEXAMETHASONE SODIUM PHOSPHATE 10 MG/ML IJ SOLN
10.0000 mg | INTRAMUSCULAR | Status: DC
  Filled 2014-12-28: qty 1

## 2014-12-29 ENCOUNTER — Encounter (HOSPITAL_COMMUNITY)
Admission: RE | Disposition: A | Discharge status: Skilled Nursing Facility | Payer: Self-pay | Source: Ambulatory Visit | Attending: Neurosurgery

## 2014-12-29 ENCOUNTER — Inpatient Hospital Stay (HOSPITAL_COMMUNITY)
Admission: RE | Admit: 2014-12-29 | Discharge: 2015-01-02 | DRG: 460 | Disposition: A | Discharge status: Skilled Nursing Facility | Payer: Medicare Other | Source: Ambulatory Visit | Attending: Neurosurgery | Admitting: Neurosurgery

## 2014-12-29 ENCOUNTER — Inpatient Hospital Stay (HOSPITAL_COMMUNITY): Payer: Medicare Other | Admitting: Anesthesiology

## 2014-12-29 ENCOUNTER — Encounter (HOSPITAL_COMMUNITY): Payer: Self-pay | Admitting: *Deleted

## 2014-12-29 ENCOUNTER — Inpatient Hospital Stay (HOSPITAL_COMMUNITY): Payer: Medicare Other

## 2014-12-29 DIAGNOSIS — F329 Major depressive disorder, single episode, unspecified: Secondary | ICD-10-CM | POA: Diagnosis present

## 2014-12-29 DIAGNOSIS — Z79810 Long term (current) use of selective estrogen receptor modulators (SERMs): Secondary | ICD-10-CM | POA: Diagnosis not present

## 2014-12-29 DIAGNOSIS — Z85858 Personal history of malignant neoplasm of other endocrine glands: Secondary | ICD-10-CM | POA: Diagnosis not present

## 2014-12-29 DIAGNOSIS — E785 Hyperlipidemia, unspecified: Secondary | ICD-10-CM | POA: Diagnosis present

## 2014-12-29 DIAGNOSIS — Z6841 Body Mass Index (BMI) 40.0 and over, adult: Secondary | ICD-10-CM

## 2014-12-29 DIAGNOSIS — Z96653 Presence of artificial knee joint, bilateral: Secondary | ICD-10-CM | POA: Diagnosis present

## 2014-12-29 DIAGNOSIS — Z923 Personal history of irradiation: Secondary | ICD-10-CM | POA: Diagnosis not present

## 2014-12-29 DIAGNOSIS — Z803 Family history of malignant neoplasm of breast: Secondary | ICD-10-CM | POA: Diagnosis not present

## 2014-12-29 DIAGNOSIS — M4316 Spondylolisthesis, lumbar region: Secondary | ICD-10-CM | POA: Diagnosis present

## 2014-12-29 DIAGNOSIS — G4733 Obstructive sleep apnea (adult) (pediatric): Secondary | ICD-10-CM | POA: Diagnosis present

## 2014-12-29 DIAGNOSIS — I10 Essential (primary) hypertension: Secondary | ICD-10-CM | POA: Diagnosis present

## 2014-12-29 DIAGNOSIS — Z8249 Family history of ischemic heart disease and other diseases of the circulatory system: Secondary | ICD-10-CM | POA: Diagnosis not present

## 2014-12-29 DIAGNOSIS — F419 Anxiety disorder, unspecified: Secondary | ICD-10-CM | POA: Diagnosis present

## 2014-12-29 DIAGNOSIS — M4806 Spinal stenosis, lumbar region: Secondary | ICD-10-CM | POA: Diagnosis present

## 2014-12-29 DIAGNOSIS — M4310 Spondylolisthesis, site unspecified: Secondary | ICD-10-CM | POA: Diagnosis present

## 2014-12-29 DIAGNOSIS — Z888 Allergy status to other drugs, medicaments and biological substances status: Secondary | ICD-10-CM | POA: Diagnosis not present

## 2014-12-29 DIAGNOSIS — C50912 Malignant neoplasm of unspecified site of left female breast: Secondary | ICD-10-CM | POA: Diagnosis present

## 2014-12-29 DIAGNOSIS — Z7902 Long term (current) use of antithrombotics/antiplatelets: Secondary | ICD-10-CM

## 2014-12-29 DIAGNOSIS — Z419 Encounter for procedure for purposes other than remedying health state, unspecified: Secondary | ICD-10-CM

## 2014-12-29 HISTORY — PX: POSTERIOR LUMBAR FUSION 2 LEVEL: SHX7170

## 2014-12-29 SURGERY — POSTERIOR LUMBAR FUSION 2 LEVEL
Anesthesia: General | Site: Back

## 2014-12-29 MED ORDER — HYDROMORPHONE HCL 1 MG/ML IJ SOLN
0.2500 mg | INTRAMUSCULAR | Status: DC | PRN
  Administered 2014-12-29 (×4): 0.5 mg via INTRAVENOUS

## 2014-12-29 MED ORDER — PROPOFOL 10 MG/ML IV BOLUS
INTRAVENOUS | Status: AC
  Filled 2014-12-29: qty 20

## 2014-12-29 MED ORDER — ROCURONIUM BROMIDE 50 MG/5ML IV SOLN
INTRAVENOUS | Status: AC
  Filled 2014-12-29: qty 1

## 2014-12-29 MED ORDER — BUPIVACAINE HCL (PF) 0.5 % IJ SOLN
INTRAMUSCULAR | Status: DC | PRN
  Administered 2014-12-29: 10 mL

## 2014-12-29 MED ORDER — LACTATED RINGERS IV SOLN
INTRAVENOUS | Status: DC
  Administered 2014-12-29 (×2): via INTRAVENOUS

## 2014-12-29 MED ORDER — PANTOPRAZOLE SODIUM 40 MG IV SOLR
40.0000 mg | Freq: Every day | INTRAVENOUS | Status: DC
  Administered 2014-12-29: 40 mg via INTRAVENOUS
  Filled 2014-12-29: qty 40

## 2014-12-29 MED ORDER — SODIUM CHLORIDE 0.9 % IV SOLN
INTRAVENOUS | Status: DC | PRN
  Administered 2014-12-29 (×2): via INTRAVENOUS

## 2014-12-29 MED ORDER — ONDANSETRON HCL 4 MG/2ML IJ SOLN: 4.0000 mg | INTRAMUSCULAR | Status: DC | PRN

## 2014-12-29 MED ORDER — ESCITALOPRAM OXALATE 10 MG PO TABS
10.0000 mg | ORAL_TABLET | Freq: Every day | ORAL | Status: DC
  Administered 2014-12-29 – 2015-01-02 (×5): 10 mg via ORAL
  Filled 2014-12-29 (×5): qty 1

## 2014-12-29 MED ORDER — HYDROMORPHONE HCL 1 MG/ML IJ SOLN
2.0000 mg | INTRAMUSCULAR | Status: DC | PRN
  Administered 2014-12-29 – 2014-12-30 (×3): 2 mg via INTRAVENOUS
  Filled 2014-12-29 (×3): qty 2

## 2014-12-29 MED ORDER — SODIUM CHLORIDE 0.9 % IR SOLN
Status: DC | PRN
  Administered 2014-12-29: 500 mL

## 2014-12-29 MED ORDER — VERAPAMIL HCL ER 240 MG PO TBCR
240.0000 mg | EXTENDED_RELEASE_TABLET | Freq: Every day | ORAL | Status: DC
  Administered 2014-12-30 – 2015-01-02 (×4): 240 mg via ORAL
  Filled 2014-12-29 (×5): qty 1

## 2014-12-29 MED ORDER — NEOSTIGMINE METHYLSULFATE 10 MG/10ML IV SOLN
INTRAVENOUS | Status: DC | PRN
  Administered 2014-12-29: 4 mg via INTRAVENOUS

## 2014-12-29 MED ORDER — PHENYLEPHRINE HCL 10 MG/ML IJ SOLN
INTRAMUSCULAR | Status: AC
  Filled 2014-12-29: qty 1

## 2014-12-29 MED ORDER — TAPENTADOL HCL 50 MG PO TABS
50.0000 mg | ORAL_TABLET | Freq: Four times a day (QID) | ORAL | Status: DC
  Administered 2014-12-30 – 2015-01-02 (×15): 50 mg via ORAL
  Filled 2014-12-29 (×15): qty 1

## 2014-12-29 MED ORDER — FENTANYL CITRATE (PF) 100 MCG/2ML IJ SOLN
INTRAMUSCULAR | Status: DC | PRN
  Administered 2014-12-29: 100 ug via INTRAVENOUS
  Administered 2014-12-29 (×3): 50 ug via INTRAVENOUS
  Administered 2014-12-29: 100 ug via INTRAVENOUS
  Administered 2014-12-29: 50 ug via INTRAVENOUS
  Administered 2014-12-29: 150 ug via INTRAVENOUS
  Administered 2014-12-29 (×4): 50 ug via INTRAVENOUS

## 2014-12-29 MED ORDER — ACETAMINOPHEN 650 MG RE SUPP: 650.0000 mg | RECTAL | Status: DC | PRN

## 2014-12-29 MED ORDER — FENTANYL CITRATE (PF) 250 MCG/5ML IJ SOLN
INTRAMUSCULAR | Status: AC
  Filled 2014-12-29: qty 5

## 2014-12-29 MED ORDER — BACITRACIN ZINC 500 UNIT/GM EX OINT
TOPICAL_OINTMENT | CUTANEOUS | Status: DC | PRN
  Administered 2014-12-29: 1 via TOPICAL

## 2014-12-29 MED ORDER — BISACODYL 5 MG PO TBEC
5.0000 mg | DELAYED_RELEASE_TABLET | Freq: Every day | ORAL | Status: DC | PRN
Start: 2014-12-29 — End: 2015-01-02

## 2014-12-29 MED ORDER — VANCOMYCIN HCL 1000 MG IV SOLR
INTRAVENOUS | Status: DC | PRN
  Administered 2014-12-29: 1000 mg

## 2014-12-29 MED ORDER — HYDROMORPHONE HCL 1 MG/ML IJ SOLN
INTRAMUSCULAR | Status: AC
  Filled 2014-12-29: qty 1

## 2014-12-29 MED ORDER — MENTHOL 3 MG MT LOZG
1.0000 | LOZENGE | OROMUCOSAL | Status: DC | PRN
  Administered 2014-12-30: 3 mg via ORAL
  Filled 2014-12-29: qty 9

## 2014-12-29 MED ORDER — ONDANSETRON HCL 4 MG/2ML IJ SOLN
INTRAMUSCULAR | Status: AC
  Filled 2014-12-29: qty 2

## 2014-12-29 MED ORDER — TAMOXIFEN CITRATE 10 MG PO TABS
10.0000 mg | ORAL_TABLET | Freq: Two times a day (BID) | ORAL | Status: DC
  Administered 2014-12-29 – 2015-01-02 (×8): 10 mg via ORAL
  Filled 2014-12-29 (×17): qty 1

## 2014-12-29 MED ORDER — SODIUM CHLORIDE 0.9 % IJ SOLN
3.0000 mL | Freq: Two times a day (BID) | INTRAMUSCULAR | Status: DC
  Administered 2014-12-29 – 2015-01-02 (×8): 3 mL via INTRAVENOUS

## 2014-12-29 MED ORDER — ALBUMIN HUMAN 5 % IV SOLN
INTRAVENOUS | Status: DC | PRN
  Administered 2014-12-29 (×2): via INTRAVENOUS

## 2014-12-29 MED ORDER — PHENOL 1.4 % MT LIQD: 1.0000 | OROMUCOSAL | Status: DC | PRN

## 2014-12-29 MED ORDER — KCL IN DEXTROSE-NACL 20-5-0.45 MEQ/L-%-% IV SOLN
80.0000 mL/h | INTRAVENOUS | Status: DC
  Filled 2014-12-29 (×2): qty 1000

## 2014-12-29 MED ORDER — TAPENTADOL HCL ER 100 MG PO TB12: 1.0000 | ORAL_TABLET | Freq: Two times a day (BID) | ORAL | Status: DC

## 2014-12-29 MED ORDER — ROCURONIUM BROMIDE 100 MG/10ML IV SOLN
INTRAVENOUS | Status: DC | PRN
  Administered 2014-12-29 (×2): 10 mg via INTRAVENOUS
  Administered 2014-12-29: 20 mg via INTRAVENOUS
  Administered 2014-12-29: 30 mg via INTRAVENOUS
  Administered 2014-12-29: 10 mg via INTRAVENOUS
  Administered 2014-12-29: 20 mg via INTRAVENOUS
  Administered 2014-12-29: 50 mg via INTRAVENOUS
  Administered 2014-12-29 (×2): 10 mg via INTRAVENOUS

## 2014-12-29 MED ORDER — DEXAMETHASONE SODIUM PHOSPHATE 10 MG/ML IJ SOLN
INTRAMUSCULAR | Status: DC | PRN
  Administered 2014-12-29: 10 mg via INTRAVENOUS

## 2014-12-29 MED ORDER — LIDOCAINE HCL (CARDIAC) 20 MG/ML IV SOLN
INTRAVENOUS | Status: AC
  Filled 2014-12-29: qty 5

## 2014-12-29 MED ORDER — ONDANSETRON HCL 4 MG/2ML IJ SOLN
INTRAMUSCULAR | Status: DC | PRN
  Administered 2014-12-29: 4 mg via INTRAVENOUS

## 2014-12-29 MED ORDER — VANCOMYCIN HCL 1000 MG IV SOLR
INTRAVENOUS | Status: AC
  Filled 2014-12-29: qty 1000

## 2014-12-29 MED ORDER — PROMETHAZINE HCL 25 MG/ML IJ SOLN
6.2500 mg | INTRAMUSCULAR | Status: DC | PRN
Start: 2014-12-29 — End: 2014-12-29

## 2014-12-29 MED ORDER — CYCLOBENZAPRINE HCL 10 MG PO TABS
10.0000 mg | ORAL_TABLET | Freq: Three times a day (TID) | ORAL | Status: DC | PRN
  Administered 2014-12-29 – 2015-01-01 (×4): 10 mg via ORAL
  Filled 2014-12-29 (×4): qty 1

## 2014-12-29 MED ORDER — IRBESARTAN 300 MG PO TABS
300.0000 mg | ORAL_TABLET | Freq: Every day | ORAL | Status: DC
Start: 2014-12-29 — End: 2015-01-02
  Administered 2014-12-29 – 2015-01-02 (×4): 300 mg via ORAL
  Filled 2014-12-29 (×4): qty 1

## 2014-12-29 MED ORDER — THROMBIN 20000 UNITS EX SOLR
CUTANEOUS | Status: DC | PRN
  Administered 2014-12-29 (×3): 20 mL via TOPICAL

## 2014-12-29 MED ORDER — HYDROMORPHONE HCL 1 MG/ML IJ SOLN
1.0000 mg | INTRAMUSCULAR | Status: DC | PRN
  Administered 2014-12-29: 1 mg via INTRAMUSCULAR
  Filled 2014-12-29: qty 1

## 2014-12-29 MED ORDER — GLYCOPYRROLATE 0.2 MG/ML IJ SOLN
INTRAMUSCULAR | Status: DC | PRN
  Administered 2014-12-29: .8 mg via INTRAVENOUS
  Administered 2014-12-29: 0.2 mg via INTRAVENOUS

## 2014-12-29 MED ORDER — DEXAMETHASONE SODIUM PHOSPHATE 10 MG/ML IJ SOLN
INTRAMUSCULAR | Status: AC
  Filled 2014-12-29: qty 1

## 2014-12-29 MED ORDER — SODIUM CHLORIDE 0.9 % IJ SOLN: 3.0000 mL | INTRAMUSCULAR | Status: DC | PRN

## 2014-12-29 MED ORDER — PROPOFOL 10 MG/ML IV BOLUS
INTRAVENOUS | Status: DC | PRN
  Administered 2014-12-29: 20 mg via INTRAVENOUS
  Administered 2014-12-29: 180 mg via INTRAVENOUS

## 2014-12-29 MED ORDER — FUROSEMIDE 20 MG PO TABS: 20.0000 mg | ORAL_TABLET | Freq: Every day | ORAL | Status: DC | PRN

## 2014-12-29 MED ORDER — DEXAMETHASONE 4 MG PO TABS: 4.0000 mg | ORAL_TABLET | Freq: Four times a day (QID) | ORAL | Status: AC

## 2014-12-29 MED ORDER — ARTIFICIAL TEARS OP OINT
TOPICAL_OINTMENT | OPHTHALMIC | Status: DC | PRN
  Administered 2014-12-29: 1 via OPHTHALMIC

## 2014-12-29 MED ORDER — OXYCODONE-ACETAMINOPHEN 5-325 MG PO TABS
1.0000 | ORAL_TABLET | ORAL | Status: DC | PRN
  Administered 2014-12-29 – 2014-12-30 (×2): 2 via ORAL
  Administered 2014-12-30: 1 via ORAL
  Administered 2014-12-30 – 2015-01-01 (×5): 2 via ORAL
  Administered 2015-01-01 (×2): 1 via ORAL
  Administered 2015-01-02: 2 via ORAL
  Filled 2014-12-29 (×7): qty 2
  Filled 2014-12-29 (×2): qty 1
  Filled 2014-12-29: qty 2
  Filled 2014-12-29: qty 1

## 2014-12-29 MED ORDER — 0.9 % SODIUM CHLORIDE (POUR BTL) OPTIME
TOPICAL | Status: DC | PRN
  Administered 2014-12-29: 1000 mL

## 2014-12-29 MED ORDER — CEFAZOLIN SODIUM-DEXTROSE 2-3 GM-% IV SOLR
2.0000 g | Freq: Three times a day (TID) | INTRAVENOUS | Status: AC
  Administered 2014-12-29 – 2014-12-30 (×4): 2 g via INTRAVENOUS
  Filled 2014-12-29 (×4): qty 50

## 2014-12-29 MED ORDER — MIDAZOLAM HCL 5 MG/5ML IJ SOLN
INTRAMUSCULAR | Status: DC | PRN
  Administered 2014-12-29: 2 mg via INTRAVENOUS

## 2014-12-29 MED ORDER — LIDOCAINE HCL (CARDIAC) 20 MG/ML IV SOLN
INTRAVENOUS | Status: DC | PRN
  Administered 2014-12-29: 80 mg via INTRAVENOUS

## 2014-12-29 MED ORDER — DOCUSATE SODIUM 100 MG PO CAPS
100.0000 mg | ORAL_CAPSULE | Freq: Two times a day (BID) | ORAL | Status: DC
  Administered 2014-12-29 – 2015-01-01 (×6): 100 mg via ORAL
  Filled 2014-12-29 (×8): qty 1

## 2014-12-29 MED ORDER — GABAPENTIN 100 MG PO CAPS
100.0000 mg | ORAL_CAPSULE | Freq: Three times a day (TID) | ORAL | Status: DC
  Administered 2014-12-29 – 2015-01-02 (×12): 100 mg via ORAL
  Filled 2014-12-29 (×12): qty 1

## 2014-12-29 MED ORDER — SODIUM CHLORIDE 0.9 % IV SOLN: 250.0000 mL | INTRAVENOUS | Status: DC

## 2014-12-29 MED ORDER — MIDAZOLAM HCL 2 MG/2ML IJ SOLN
INTRAMUSCULAR | Status: AC
  Filled 2014-12-29: qty 2

## 2014-12-29 MED ORDER — DEXAMETHASONE SODIUM PHOSPHATE 4 MG/ML IJ SOLN
4.0000 mg | Freq: Four times a day (QID) | INTRAMUSCULAR | Status: AC
  Administered 2014-12-29 – 2014-12-30 (×2): 4 mg via INTRAVENOUS
  Filled 2014-12-29 (×2): qty 1

## 2014-12-29 MED ORDER — ALBUTEROL SULFATE (2.5 MG/3ML) 0.083% IN NEBU: 2.5000 mg | INHALATION_SOLUTION | Freq: Four times a day (QID) | RESPIRATORY_TRACT | Status: DC | PRN

## 2014-12-29 MED ORDER — NEOSTIGMINE METHYLSULFATE 10 MG/10ML IV SOLN
INTRAVENOUS | Status: AC
  Filled 2014-12-29: qty 1

## 2014-12-29 MED ORDER — DEXTROSE 5 % IV SOLN
10.0000 mg | INTRAVENOUS | Status: DC | PRN
  Administered 2014-12-29: 20 ug/min via INTRAVENOUS

## 2014-12-29 MED ORDER — GLYCOPYRROLATE 0.2 MG/ML IJ SOLN
INTRAMUSCULAR | Status: AC
  Filled 2014-12-29: qty 4

## 2014-12-29 MED ORDER — ACETAMINOPHEN 325 MG PO TABS
650.0000 mg | ORAL_TABLET | ORAL | Status: DC | PRN
  Administered 2015-01-01: 650 mg via ORAL
  Filled 2014-12-29: qty 2

## 2014-12-29 SURGICAL SUPPLY — 70 items
APL SKNCLS STERI-STRIP NONHPOA (GAUZE/BANDAGES/DRESSINGS) ×1
BAG DECANTER FOR FLEXI CONT (MISCELLANEOUS) ×3 IMPLANT
BENZOIN TINCTURE PRP APPL 2/3 (GAUZE/BANDAGES/DRESSINGS) ×4 IMPLANT
BLADE CLIPPER SURG (BLADE) ×3 IMPLANT
BONE EQUIVA 10CC (Bone Implant) ×2 IMPLANT
BRUSH SCRUB EZ PLAIN DRY (MISCELLANEOUS) ×3 IMPLANT
BUR CUTTER 7.0 ROUND (BURR) ×7 IMPLANT
BUR MATCHSTICK NEURO 3.0 LAGG (BURR) ×3 IMPLANT
CANISTER SUCT 3000ML PPV (MISCELLANEOUS) ×3 IMPLANT
CLOSURE WOUND 1/2 X4 (GAUZE/BANDAGES/DRESSINGS) ×1
CONT SPEC 4OZ CLIKSEAL STRL BL (MISCELLANEOUS) ×8 IMPLANT
COVER BACK TABLE 60X90IN (DRAPES) ×5 IMPLANT
DRAPE C-ARM 42X72 X-RAY (DRAPES) ×3 IMPLANT
DRAPE C-ARMOR (DRAPES) ×3 IMPLANT
DRAPE LAPAROTOMY 100X72X124 (DRAPES) ×3 IMPLANT
DRAPE SURG 17X23 STRL (DRAPES) ×6 IMPLANT
DRSG OPSITE POSTOP 4X10 (GAUZE/BANDAGES/DRESSINGS) ×2 IMPLANT
DRSG OPSITE POSTOP 4X6 (GAUZE/BANDAGES/DRESSINGS) ×1 IMPLANT
DRSG TELFA 3X8 NADH (GAUZE/BANDAGES/DRESSINGS) IMPLANT
DURAPREP 26ML APPLICATOR (WOUND CARE) ×3 IMPLANT
ELECT REM PT RETURN 9FT ADLT (ELECTROSURGICAL) ×3
ELECTRODE REM PT RTRN 9FT ADLT (ELECTROSURGICAL) ×1 IMPLANT
EVACUATOR 1/8 PVC DRAIN (DRAIN) ×1 IMPLANT
GAUZE SPONGE 4X4 12PLY STRL (GAUZE/BANDAGES/DRESSINGS) ×1 IMPLANT
GAUZE SPONGE 4X4 16PLY XRAY LF (GAUZE/BANDAGES/DRESSINGS) ×6 IMPLANT
GLOVE BIOGEL PI IND STRL 7.0 (GLOVE) IMPLANT
GLOVE BIOGEL PI INDICATOR 7.0 (GLOVE) ×4
GLOVE ECLIPSE 7.0 STRL STRAW (GLOVE) ×2 IMPLANT
GLOVE ECLIPSE 8.0 STRL XLNG CF (GLOVE) ×10 IMPLANT
GLOVE INDICATOR 8.5 STRL (GLOVE) ×2 IMPLANT
GLOVE SURG SS PI 7.0 STRL IVOR (GLOVE) ×6 IMPLANT
GOWN STRL REUS W/ TWL LRG LVL3 (GOWN DISPOSABLE) IMPLANT
GOWN STRL REUS W/ TWL XL LVL3 (GOWN DISPOSABLE) ×2 IMPLANT
GOWN STRL REUS W/TWL 2XL LVL3 (GOWN DISPOSABLE) ×2 IMPLANT
GOWN STRL REUS W/TWL LRG LVL3 (GOWN DISPOSABLE) ×3
GOWN STRL REUS W/TWL XL LVL3 (GOWN DISPOSABLE) ×12
HANDLE PEDIGUARD CANNULATED (INSTRUMENTS) ×2 IMPLANT
IMPLANT ARDIS PEEK 10X9X26 (Orthopedic Implant) ×4 IMPLANT
IMPLANT PEEK ARDIS 8 X 8 X 26 (Orthopedic Implant) ×4 IMPLANT
K-WIRE NITHNOL TROCAR TIP (WIRE) ×12 IMPLANT
KIT BASIN OR (CUSTOM PROCEDURE TRAY) ×3 IMPLANT
KIT ROOM TURNOVER OR (KITS) ×3 IMPLANT
LIQUID BAND (GAUZE/BANDAGES/DRESSINGS) IMPLANT
MILL MEDIUM DISP (BLADE) ×2 IMPLANT
NEEDLE 1 PEDIGUARD CANNULATED (NEEDLE) ×4 IMPLANT
NEEDLE HYPO 22GX1.5 SAFETY (NEEDLE) ×3 IMPLANT
NEEDLE TARGETING (NEEDLE) ×2 IMPLANT
NS IRRIG 1000ML POUR BTL (IV SOLUTION) ×3 IMPLANT
PACK LAMINECTOMY NEURO (CUSTOM PROCEDURE TRAY) ×3 IMPLANT
PAD ARMBOARD 7.5X6 YLW CONV (MISCELLANEOUS) ×9 IMPLANT
PAD DRESSING TELFA 3X8 NADH (GAUZE/BANDAGES/DRESSINGS) ×1 IMPLANT
PATTIES SURGICAL .75X.75 (GAUZE/BANDAGES/DRESSINGS) ×3 IMPLANT
ROD PRE BENT PERC 60MM (Rod) ×4 IMPLANT
SCREW POLYAXIA MIS 6.5X40MM (Screw) ×12 IMPLANT
SHEATH PAT (SHEATH) ×2 IMPLANT
SPONGE LAP 4X18 X RAY DECT (DISPOSABLE) IMPLANT
SPONGE SURGIFOAM ABS GEL 100 (HEMOSTASIS) ×5 IMPLANT
STRIP CLOSURE SKIN 1/2X4 (GAUZE/BANDAGES/DRESSINGS) ×3 IMPLANT
SUT PROLENE 0 CT 1 30 (SUTURE) ×2 IMPLANT
SUT VIC AB 0 CT1 18XCR BRD8 (SUTURE) ×1 IMPLANT
SUT VIC AB 0 CT1 8-18 (SUTURE) ×6
SUT VIC AB 2-0 OS6 18 (SUTURE) ×9 IMPLANT
SUT VIC AB 3-0 CP2 18 (SUTURE) ×5 IMPLANT
SYR 20ML ECCENTRIC (SYRINGE) ×3 IMPLANT
TOP CLSR SEQUOIA (Orthopedic Implant) ×12 IMPLANT
TOWEL OR 17X24 6PK STRL BLUE (TOWEL DISPOSABLE) ×3 IMPLANT
TOWEL OR 17X26 10 PK STRL BLUE (TOWEL DISPOSABLE) ×3 IMPLANT
TRAP SPECIMEN MUCOUS 40CC (MISCELLANEOUS) IMPLANT
TRAY FOLEY W/METER SILVER 14FR (SET/KITS/TRAYS/PACK) ×3 IMPLANT
WATER STERILE IRR 1000ML POUR (IV SOLUTION) ×3 IMPLANT

## 2014-12-29 NOTE — Progress Notes (Signed)
Orthopedic Tech Progress Note Patient Details:  [name] 1946/04/04 [id number] Patient was a pre-fit. Patient ID: [name], female   DOB: 05-27-1946, 69 y.o.   MRN: [id number]   [name] 12/29/2014, 9:44 PM

## 2014-12-29 NOTE — Anesthesia Preprocedure Evaluation (Addendum)
Anesthesia Evaluation  Patient identified by MRN, date of birth, ID band Patient awake    Reviewed: Allergy & Precautions, NPO status , Patient's Chart, lab work & pertinent test results  Airway Mallampati: II  TM Distance: >3 FB Neck ROM: Full    Dental no notable dental hx.    Pulmonary shortness of breath, with exertion and lying, sleep apnea ,  breath sounds clear to auscultation  + decreased breath sounds      Cardiovascular hypertension, Pt. on medications Normal cardiovascular examRhythm:Regular Rate:Normal     Neuro/Psych negative neurological ROS  negative psych ROS   GI/Hepatic negative GI ROS, Neg liver ROS,   Endo/Other  Morbid obesity  Renal/GU negative Renal ROS  negative genitourinary   Musculoskeletal negative musculoskeletal ROS (+)   Abdominal   Peds negative pediatric ROS (+)  Hematology negative hematology ROS (+)   Anesthesia Other Findings   Reproductive/Obstetrics negative OB ROS                            Anesthesia Physical Anesthesia Plan  ASA: III  Anesthesia Plan: General   Post-op Pain Management:    Induction: Intravenous  Airway Management Planned: Oral ETT  Additional Equipment:   Intra-op Plan:   Post-operative Plan: Extubation in OR  Informed Consent: I have reviewed the patients History and Physical, chart, labs and discussed the procedure including the risks, benefits and alternatives for the proposed anesthesia with the patient or authorized representative who has indicated his/her understanding and acceptance.   Dental advisory given  Plan Discussed with: CRNA and Surgeon  Anesthesia Plan Comments:         Anesthesia Quick Evaluation

## 2014-12-29 NOTE — H&P (Signed)
[name] is an 69 y.o. female.   Chief Complaint: Back and bilateral leg pain HPI: The patient is a 69 year old female who is well-known to me as we have removed a pituitary tumor from her back and number of years ago. She is an well from that standpoint. Return to [name] over the last year so complaint of back and bilateral leg pain. Should 1 imaging studies which showed some stenosis at the lowest 2 levels in her spine and she was tried on aggressive conservative therapy without improvement. At one point she was tentatively scheduled for surgery.was somewhat hesitant and chose to live with the problem for a while longer. She returns was back early this year and we had a repeat MRI scan which showed progression of her disease at the lowest 2 levels in her lumbar spine and at that point the options were discussed. The patient requested surgery and now comes for a two-level posterior lumbar interbody fusion with pedicle screw fixation due to the listhesis and significant stenosis at both levels. I've had a long discussion with her regarding the risks and benefits of surgical intervention. The risks discussed include but are not limited to bleeding infection weakness numbness paralysis spinal fluid leak trouble with instrumentation nonunion coma and death. We have discussed alternative methods of therapy offered risks and benefits of nonintervention. She's had the opportunity to ask numerous questions and appears to understand. With this information in hand she has requested we proceed with surgery.  Past Medical History  Diagnosis Date  . OSA (obstructive sleep apnea)   . Hypertension   . Hyperlipidemia   . Headache(784.0)   . Blood transfusion   . Pneumonia 4/12  . Anemia   . Anxiety   . Depression     Dr.Lewit  . Diverticulosis   . Radiation 10/29/11-11/24/11    left breast 6100 cGy  . Tinnitus   . History of colon polyps 1998    adenomatous  . Spinal stenosis   . Bronchitis     hx of   . Shortness of breath     with exertion  . Arthritis   . Breast cancer 2013    left breast    Past Surgical History  Procedure Laterality Date  . Total knee arthroplasty  2006    bilateral  . Polypectomy  1990's  . Appendectomy  1978  . Thyroid cyst excision  1994  . Transphenoidal / transnasal hypophysectomy / resection pituitary tumor  6/11  . Belpharoptosis repair    . Breast lumpectomy  08/2011    left  . Joint replacement Bilateral     bilateral knee  . Colonoscopy w/ polypectomy    . Breast lumpectomy with needle localization Right 02/06/2013    Procedure: RIGHT BREAST LUMPECTOMY WITH NEEDLE LOCALIZATION;  Surgeon: Harl Bowie, MD;  Location: [facility];  Service: General;  Laterality: Right;    Family History  Problem Relation Age of Onset  . Heart disease Father   . Clotting disorder Father   . Stroke Father   . Stroke Mother   . Diabetes Mother   . Colon cancer Neg Hx   . Cancer Sister 49    Breast Cancer   Social History:  reports that she has never smoked. She has never used smokeless tobacco. She reports that she does not drink alcohol or use illicit drugs.  Allergies:  Allergies  Allergen Reactions  . Cymbalta [Duloxetine Hcl]     Stomach pain  . Lipitor [Atorvastatin]  Other (See Comments)    Cramps  . Maxzide [Hydrochlorothiazide W-Triamterene] Other (See Comments)    Cramping   . Pravastatin Sodium Other (See Comments)    Headache   . Lodine [Etodolac] Itching    Medications Prior to Admission  Medication Sig Dispense Refill  . albuterol (PROVENTIL HFA;VENTOLIN HFA) 108 (90 BASE) MCG/ACT inhaler Inhale 2 puffs into the lungs every 6 (six) hours as needed for wheezing.    . cholecalciferol (VITAMIN D) 1000 UNITS tablet Take 1,000 Units by mouth daily.    . clopidogrel (PLAVIX) 75 MG tablet Take 75 mg by mouth daily.      [name] escitalopram (LEXAPRO) 10 MG tablet Take 10 mg by mouth daily.    . fish oil-omega-3 fatty acids 1000 MG capsule Take 1  capsule by mouth 2 (two) times daily.      . furosemide (LASIX) 20 MG tablet Take 20 mg by mouth daily as needed for fluid.    [name] gabapentin (NEURONTIN) 100 MG capsule Take 100 mg by mouth 3 (three) times daily.    [name] HYDROcodone-acetaminophen (NORCO) 10-325 MG per tablet Take 1 tablet by mouth every 6 (six) hours as needed.    . irbesartan (AVAPRO) 300 MG tablet Take 300 mg by mouth daily.  0  . niacin (NIASPAN) 500 MG CR tablet Take 500 mg by mouth 2 (two) times daily.     . tamoxifen (NOLVADEX) 20 MG tablet Take 1 tablet by mouth  daily 90 tablet 3  . Tapentadol HCl 100 MG TB12 Take 1 tablet by mouth 2 (two) times daily.    . verapamil (VERELAN PM) 240 MG 24 hr capsule Take 240 mg by mouth daily.     [name] oxyCODONE-acetaminophen (PERCOCET/ROXICET) 5-325 MG per tablet Take 1 tablet by mouth every 4 (four) hours as needed for severe pain. (Patient not taking: Reported on 12/22/2014) 10 tablet 0    No results found for this or any previous visit (from the past 48 hour(s)). No results found.  Positive for history of pituitary tumor as well as anxiety and depression some diverticulosis  Blood pressure 171/81, pulse 91, temperature 98.6 F (37 C), temperature source Oral, resp. rate 20, weight 131.543 kg (290 lb), SpO2 98 %.  The patient is awake alert and oriented. She is no facial asymmetry. Her gait is slow mildly antalgic. She is decreased reflexes but normal strength. Assessment/Plan Impression is that of listhesis and stenosis at L3-4 and L4-5 (the lowest 2 levels in her spine) the plan is for a two-level interbody fusion with pedicle screw fixation.  Faythe Ghee, MD 12/29/2014, 8:41 AM

## 2014-12-29 NOTE — Anesthesia Procedure Notes (Signed)
Procedure Name: Intubation Date/Time: 12/29/2014 8:53 AM Performed by: Ignacia Bayley Pre-anesthesia Checklist: Patient identified, Emergency Drugs available, Suction available and Patient being monitored Patient Re-evaluated:Patient Re-evaluated prior to inductionOxygen Delivery Method: Circle system utilized Preoxygenation: Pre-oxygenation with 100% oxygen Intubation Type: IV induction Ventilation: Mask ventilation without difficulty Laryngoscope Size: Miller and 2 Grade View: Grade II Tube type: Oral Number of attempts: 1 Airway Equipment and Method: Stylet Placement Confirmation: ETT inserted through vocal cords under direct vision,  breath sounds checked- equal and bilateral and positive ETCO2 Secured at: 22 cm Tube secured with: Tape Dental Injury: Teeth and Oropharynx as per pre-operative assessment

## 2014-12-29 NOTE — Op Note (Signed)
Preop diagnosis: Grade 1 spondylolisthesis with severe spinal stenosis L3-4 L4-5 with central and lateral recess stenosis and compression of L3-L4 and L5 nerve roots Postoperative diagnosis: Same Procedure: Bilateral L3-4 L4-5 decompressive laminectomy with decompression of L3-L4 and L5 nerve roots and relief of central and lateral stenosis more so than needed for interbody fusion Bilateral L3-4 L4-5 microdiscectomy L3-4 L4-5 posterior lumbar interbody fusion with peek interbody spacer L3-4  L4-5 posterolateral fusion L3-4 L4-5 segmental instrumentation with Pathfinder percutaneous pedicle screw system Surgeon: Lomax Poehler Asst.: Pool  After and placed the prone position the patient's back was prepped and draped in the usual sterile fashion. Localizing x-rays taken prior to incision to identify the appropriate levels. Midline incision was made above the spinous processes of L3-L4 and L5. Using Bovie cutting current the incision was carried on the spinous processes. We then undermined the plane between the dorsal lumbar fascia in the subcutaneous and cutaneous tissue. Subperiosteal dissection was then carried out along the spinous processes and lamina of L3-L4 and L5 self retaining retractor was placed for exposure. Excision approach the appropriate levels. Using Leksell rongeur spinous processes were removed. Starting the patient's left side generous laminotomy was performed by removing the inferior three quarters of the L3 lamina the medial three quarters the facet joint and the superior one third of the L4 lamina. Residual bone and ligamentum flavum removed in a piecemeal fashion. Similar decompression was then carried out at L4-5. We then did the opposite side and then remove the residual midline structures to complete the bilateral decompression and L3-4 and L4-5. L3 and L4 nerve roots were decompressed as well as the L5 nerve root as they L1 around the pedicle. The central lateral recess stenosis were  treated more so than needed for interbody fusion. We then coagulated the disc space incised and thoroughly cleaned out at both levels. We then prepared the disc space for interbody fusion. We distracted the L3-4 level to 10 milliner size the L4-5 to 8 mm size these were felt to be good choices. We thoroughly prepared the disc for interbody fusion and then placed 8 x 9 x 26 Millry cages at L4-5 and 10 x 9 x 26 Miller cages at L3-4. Prior to placing the second cage autologous bone and morselized allograft were placed deep within the interspace to help with interbody fusion this was the same mixture was used within the cages. We then irrigated copiously controlled any bleeding with upper coagulation Gelfoam. We closed the lumbar dorsal fascia and then placed pedicle screws percutaneously at L3-L4 and L5 bilaterally. We then tapped with a 6 Millry tap and placed 6.5 mm x 40 mm screws at L3-L4 and L5 bilaterally. These were followed into good position. We then chose an appropriately length rod and secured to the top of the screws and did tightening tight final tightening with torque and counter torque. Final fluoroscopy showed good position of the instrumentation. Irrigation was carried out and the lumbar dorsal fascia was closed once again. We left a suprafascial drain in the suprafascial space then closed the subcutaneous and subcuticular tissues with interrupted Vicryls. A running locking point was placed on the skin. Shortness was then applied and the patient was extubated and taken to recovery room in stable condition.

## 2014-12-29 NOTE — Transfer of Care (Signed)
Immediate Anesthesia Transfer of Care Note  Patient: Ashyla Luth  Procedure(s) Performed: Procedure(s): POSTERIOR LUMBAR FUSION 2 LEVEL (N/A)  Patient Location: PACU  Anesthesia Type:General  Level of Consciousness: sedated  Airway & Oxygen Therapy: Patient Spontanous Breathing and Patient connected to nasal cannula oxygen  Post-op Assessment: Report given to RN and Post -op Vital signs reviewed and stable  Post vital signs: stable  Last Vitals:  Filed Vitals:   12/29/14 0728  BP: 171/81  Pulse:   Temp:   Resp:     Complications: No apparent anesthesia complications

## 2014-12-29 NOTE — Anesthesia Postprocedure Evaluation (Signed)
  Anesthesia Post-op Note  Patient: [name]  Procedure(s) Performed: Procedure(s) (LRB): POSTERIOR LUMBAR FUSION 2 LEVEL (N/A)  Patient Location: PACU  Anesthesia Type: General  Level of Consciousness: awake and alert   Airway and Oxygen Therapy: Patient Spontanous Breathing  Post-op Pain: mild  Post-op Assessment: Post-op Vital signs reviewed, Patient's Cardiovascular Status Stable, Respiratory Function Stable, Patent Airway and No signs of Nausea or vomiting  Last Vitals:  Filed Vitals:   12/29/14 1539  BP: 143/76  Pulse: 101  Temp:   Resp: 20    Post-op Vital Signs: stable   Complications: No apparent anesthesia complications

## 2014-12-29 NOTE — Progress Notes (Signed)
Patient arrived to 104N05. Patient states pain is "10/10" in lower back, honey comb dressing is clean, dry, and intact, hemovac is intact with bloody drainage, SCDs on, VSS, patient denies numbness or tingling in all extremities. Patient oriented to room, call bell within patient's reach.

## 2014-12-29 NOTE — Progress Notes (Signed)
Pt c/o of severe pain at 2100, was given tab percocet 2 at [place] with little or no effect, Dr Annette Stable (on call) paged and notified, ordered to change pt's dose of im dilaudid to 2mg  and the route changed to iv, same commenced at 2114, pt reassured, will however continue to monitor. Ob[place]-Asidi, Lynnell Fiumara Efe

## 2014-12-30 MED ORDER — MORPHINE SULFATE 4 MG/ML IJ SOLN
4.0000 mg | INTRAMUSCULAR | Status: DC | PRN
  Administered 2014-12-30: 4 mg via INTRAMUSCULAR
  Filled 2014-12-30: qty 1

## 2014-12-30 MED ORDER — HYDROMORPHONE HCL 1 MG/ML IJ SOLN
0.5000 mg | INTRAMUSCULAR | Status: DC | PRN
  Administered 2014-12-30 – 2014-12-31 (×3): 1 mg via INTRAVENOUS
  Filled 2014-12-30 (×3): qty 1

## 2014-12-30 MED ORDER — PANTOPRAZOLE SODIUM 40 MG PO TBEC
40.0000 mg | DELAYED_RELEASE_TABLET | Freq: Every day | ORAL | Status: DC
  Administered 2014-12-30 – 2015-01-02 (×4): 40 mg via ORAL
  Filled 2014-12-30 (×4): qty 1

## 2014-12-30 MED ORDER — LACTATED RINGERS IV BOLUS (SEPSIS)
500.0000 mL | Freq: Once | INTRAVENOUS | Status: AC
  Administered 2014-12-30: 500 mL via INTRAVENOUS

## 2014-12-30 NOTE — Evaluation (Signed)
Occupational Therapy Evaluation Patient Details Name: [name] MRN: [id number] DOB: 07-11-1946 Today's Date: 12/30/2014    History of Present Illness 68 y.o female s/p lumbar fusion. PMH: bilateral total knee arthroplasty in 2006, shortness of breath with exertion, hypertension and arthritis   Clinical Impression   Patient is s/p lumbar fusion surgery resulting in functional limitations due to the deficits listed below (see OT problem list). Pt sitting in chair upon arrival. Pt equired cuing to adjust brace upon standing. Pt completed grooming activity at sink with cuing to maintain precautions, fatigues quickly. Pt lost balance twice while ambulating to bathroom. Education provided on back precautions and maintaining precautions during ADLs.  Unable to determine d/c location at this time, Pt live in two story home and will need to be able to navigate 7 stair to get to bedroom and bathroom. Pt would benefit from skilled OT service to address balance and maintaining precautions during ADLs.     Follow Up Recommendations  Home health OT (if able to navigate stairs with PT)    Equipment Recommendations  Tub/shower seat    Recommendations for Other Services       Precautions / Restrictions Precautions Precautions: Back Precaution Booklet Issued: Yes (comment) Required Braces or Orthoses: Spinal Brace Spinal Brace: Lumbar corset;Applied in sitting position Restrictions Weight Bearing Restrictions: No      Mobility Bed Mobility               General bed mobility comments: pt in chair  Transfers Overall transfer level: Needs assistance Equipment used: Rolling walker (2 wheeled) Transfers: Sit to/from Stand Sit to Stand: Min guard         General transfer comment: cuing for hand placement during sit<>stand    Balance Overall balance assessment: Needs assistance Sitting-balance support: Feet supported       Standing balance support: Single extremity  supported;During functional activity                                ADL Overall ADL's : Needs assistance/impaired Eating/Feeding: Independent   Grooming: Wash/dry hands;Wash/dry face;Oral care;Cueing for safety;Standing;Minimal assistance Grooming Details (indicate cue type and reason): Pt is R handed, uses L hand for functional activites at sink. Education provided on maintaining precautions during grooming. Upper Body Bathing: Minimal assitance;Sitting   Lower Body Bathing: Moderate assistance;Sit to/from stand   Upper Body Dressing : Minimal assistance;Sitting (don brace) Upper Body Dressing Details (indicate cue type and reason): cuing to tighten brace upon standing Lower Body Dressing: Moderate assistance;Sit to/from stand   Toilet Transfer: Min guard;BSC;RW   Toileting- Water quality scientist and Hygiene: Minimal assistance;Sit to/from stand;Cueing for back precautions   Tub/ Shower Transfer: Tub transfer;Shower seat;Ambulation;Rolling walker   Functional mobility during ADLs: Minimal assistance General ADL Comments: Pt requesting information about shower chair for home use. Pt able to complete grooming activity at sink. Min A for balance and cuing to maintain precautions.     Vision Vision Assessment?: No apparent visual deficits   Perception     Praxis      Pertinent Vitals/Pain Pain Assessment: 0-10 Pain Score: 6  Pain Location: lower back Pain Descriptors / Indicators: Aching Pain Intervention(s): Monitored during session;Repositioned     Hand Dominance Right   Extremity/Trunk Assessment Upper Extremity Assessment Upper Extremity Assessment: Generalized weakness   Lower Extremity Assessment Lower Extremity Assessment: Defer to PT evaluation       Communication Communication Communication: No difficulties  Cognition Arousal/Alertness: Awake/alert Behavior During Therapy: WFL for tasks assessed/performed Overall Cognitive Status: Within  Functional Limits for tasks assessed                     General Comments       Exercises       Shoulder Instructions      Home Living Family/patient expects to be discharged to:: Private residence Living Arrangements: Alone Available Help at Discharge: Family;Available PRN/intermittently (children) Type of Home: House Home Access: Stairs to enter [employer] of Steps: 3 Entrance Stairs-Rails: None Home Layout: Two level;Bed/bath upstairs Alternate Level Stairs-Number of Steps: 7   Bathroom Shower/Tub: Walk-in shower;Door   [employer] Toilet: Handicapped height     Home Equipment: Toilet riser;Walker - 4 wheels;Grab bars - toilet          Prior Functioning/Environment Level of Independence: Independent             OT Diagnosis: Generalized weakness;Acute pain   OT Problem List: Decreased strength;Decreased activity tolerance;Impaired balance (sitting and/or standing);Decreased safety awareness;Decreased knowledge of precautions;Decreased knowledge of use of DME or AE;Obesity;Pain   OT Treatment/Interventions: Self-care/ADL training;Therapeutic exercise;Energy conservation;DME and/or AE instruction;Therapeutic activities;Patient/family education;Balance training    OT Goals(Current goals can be found in the care plan section) Acute Rehab OT Goals Patient Stated Goal: to go home and see great grandchild OT Goal Formulation: With patient Time For Goal Achievement: 01/13/15 Potential to Achieve Goals: Good  OT Frequency: Min 2X/week   Barriers to D/C:            Co-evaluation              End of Session Equipment Utilized During Treatment: Gait belt;Rolling walker;Back brace Nurse Communication: Mobility status;Precautions  Activity Tolerance: Patient tolerated treatment well Patient left: in chair;with call bell/phone within reach;with family/visitor present   Time: 6789-3810 OT Time Calculation (min): 30 min Charges:     G-Codes:    Forest Gleason 12/30/2014, 10:15 AM

## 2014-12-30 NOTE — Progress Notes (Signed)
Patient's manual blood pressure 83/38, patient denies dizziness, lightheadedness, no complaints, no apparent distress. MD paged. MD ordered 500 cc bolus lactated ringers. Will recheck BP after bolus.

## 2014-12-30 NOTE — Progress Notes (Signed)
Patient ID: [name], female   DOB: 1946-07-15, 69 y.o.   MRN: [id number] Afeb, vss No  New neuro issues Wound clean and dry. Legs feel much better. Will increase activity with PT and home next 1 or 2 days.

## 2014-12-30 NOTE — Evaluation (Signed)
Physical Therapy Evaluation Patient Details Name: [name] MRN: [id number] DOB: Jul 05, 1946 Today's Date: 12/30/2014   History of Present Illness  69 y.o female s/p lumbar fusion. PMH: bilateral total knee arthroplasty in 2006, shortness of breath with exertion, hypertension and arthritis  Clinical Impression  Patient demonstrates deficits in functional mobility as indicated below. Will need continued skilled PT to address deficits and maximize function. Will see as indicated and progress as tolerated.    Follow Up Recommendations Home health PT;Supervision - Intermittent    Equipment Recommendations  Rolling walker with 5" wheels    Recommendations for Other Services       Precautions / Restrictions Precautions Precautions: Back Precaution Booklet Issued: Yes (comment) Required Braces or Orthoses: Spinal Brace Spinal Brace: Lumbar corset;Applied in sitting position Restrictions Weight Bearing Restrictions: No      Mobility  Bed Mobility Overal bed mobility: Needs Assistance Bed Mobility: Rolling;Sidelying to Sit;Sit to Sidelying Rolling: Min assist Sidelying to sit: Min assist     Sit to sidelying: Min assist General bed mobility comments: Max cues for technique and sequencing, min assist to power to upright  Transfers Overall transfer level: Needs assistance Equipment used: Rolling walker (2 wheeled) Transfers: Sit to/from Stand Sit to Stand: Min guard         General transfer comment: VCs for hand placement  Ambulation/Gait Ambulation/Gait assistance: Min guard Ambulation Distance (Feet): 240 Feet Assistive device: Rolling walker (2 wheeled) Gait Pattern/deviations: Step-through pattern;Decreased stride length;Drifts right/left;Narrow base of support Gait velocity: decreased Gait velocity interpretation: Below normal speed for age/gender General Gait Details: no physical assist, min guard initially, 3 standing rest breaks secondary to SOB with  mobility  Stairs            Wheelchair Mobility    Modified Rankin (Stroke Patients Only)       Balance Overall balance assessment: Needs assistance Sitting-balance support: Feet supported Sitting balance-Leahy Scale: Fair     Standing balance support: Bilateral upper extremity supported Standing balance-Leahy Scale: Fair                               Pertinent Vitals/Pain Pain Assessment: 0-10 Pain Score: 7  Pain Location: back Pain Descriptors / Indicators: Aching;Sore Pain Intervention(s): Monitored during session;Repositioned    Home Living Family/patient expects to be discharged to:: Private residence Living Arrangements: Alone Available Help at Discharge: Family;Available PRN/intermittently (children) Type of Home: House Home Access: Stairs to enter Entrance Stairs-Rails: None Entrance Stairs-Number of Steps: 3 Home Layout: Two level;Bed/bath upstairs Home Equipment: Toilet riser;Walker - 4 wheels;Grab bars - toilet      Prior Function Level of Independence: Independent               Hand Dominance   Dominant Hand: Right    Extremity/Trunk Assessment   Upper Extremity Assessment: Generalized weakness           Lower Extremity Assessment: Generalized weakness         Communication   Communication: No difficulties  Cognition Arousal/Alertness: Awake/alert Behavior During Therapy: WFL for tasks assessed/performed Overall Cognitive Status: Within Functional Limits for tasks assessed                      General Comments General comments (skin integrity, edema, etc.): reeducated regarding precautions with 2/3 carry over and recall. discussed technique for repositioning, educated regarding mobility expectations and pain management strategies    Exercises  Assessment/Plan    PT Assessment Patient needs continued PT services  PT Diagnosis Acute pain;Generalized weakness;Abnormality of gait;Difficulty  walking   PT Problem List Decreased strength;Decreased range of motion;Decreased activity tolerance;Decreased balance;Decreased mobility;Decreased safety awareness;Decreased knowledge of precautions;Obesity;Pain  PT Treatment Interventions DME instruction;Gait training;Stair training;Functional mobility training;Therapeutic activities;Therapeutic exercise;Balance training;Patient/family education   PT Goals (Current goals can be found in the Care Plan section) Acute Rehab PT Goals Patient Stated Goal: to go home and see great grandchild PT Goal Formulation: With patient Time For Goal Achievement: 01/13/15 Potential to Achieve Goals: Good    Frequency Min 5X/week   Barriers to discharge Decreased caregiver support;Inaccessible home environment steps to enter    Co-evaluation               End of Session Equipment Utilized During Treatment: Gait belt;Back brace Activity Tolerance: Patient tolerated treatment well;No increased pain Patient left: in bed;with call bell/phone within reach;with bed alarm set;with family/visitor present Nurse Communication: Mobility status         Time: 9735-3299 PT Time Calculation (min) (ACUTE ONLY): 29 min   Charges:   PT Evaluation $Initial PT Evaluation Tier I: 1 Procedure PT Treatments $Gait Training: 8-22 mins   PT G CodesDuncan Dull 01-01-15, 5:15 PM Alben Deeds, [place] DPT  [phone]

## 2014-12-30 NOTE — Progress Notes (Signed)
Set pt up on our Cpap.. Attached her tubing and nasal pillows brought from home.  Pt states she is not wanting to wear at th is time.  Will have RN call RT when wants to wear it.  Advised RN of patients decision..she is aware.

## 2014-12-31 NOTE — Progress Notes (Signed)
Occupational Therapy Treatment Patient Details Name: [name] MRN: [id number] DOB: 11-08-1945 Today's Date: 12/31/2014    History of present illness 69 y.o female s/p lumbar fusion. PMH: bilateral total knee arthroplasty in 2006, shortness of breath with exertion, hypertension and arthritis   OT comments  Pt in chair upon OT arrival, stating pain is 10/10.   Pt able to verbalize 2/3 precautions, but required cues to maintain precautions during functional activities. Education provided on use of AE for LB dressing, and maintaining precautions during ADL tasks.  Pt reports having a "bad night", and feels unable to complete ADLs at home at this time due to pain. Pt reports A for ADLs is more than husband can assist with, and is requesting SNF for rehab.  Follow Up Recommendations  SNF (Pt requesting SNF for rehab)    Equipment Recommendations       Recommendations for Other Services      Precautions / Restrictions Precautions Precautions: Back Precaution Booklet Issued: Yes (comment) Required Braces or Orthoses: Spinal Brace Spinal Brace: Lumbar corset;Applied in sitting position Restrictions Weight Bearing Restrictions: No       Mobility Bed Mobility               General bed mobility comments: pt in chair upon arrival  Transfers Overall transfer level: Needs assistance Equipment used: Rolling walker (2 wheeled) Transfers: Sit to/from Stand Sit to Stand: Min guard         General transfer comment: VCs for hand placement    Balance Overall balance assessment: Needs assistance Sitting-balance support: Feet supported Sitting balance-Leahy Scale: Fair     Standing balance support: Single extremity supported;During functional activity Standing balance-Leahy Scale: Fair                     ADL Overall ADL's : Needs assistance/impaired Eating/Feeding: Independent   Grooming: Wash/dry hands;Wash/dry face;Oral care;Standing;Minimal assistance (cuing  for back precautions) Grooming Details (indicate cue type and reason): Pt is R handed, uses L hand for functional activites at sink. Education provided on maintaining precautions during grooming.     Lower Body Bathing: Maximal assistance;Cueing for back precautions;Sit to/from stand (peri care)   Upper Body Dressing : Supervision/safety;Sitting (don brace)   Lower Body Dressing: With adaptive equipment;Cueing for back precautions;Sit to/from stand;Minimal assistance Lower Body Dressing Details (indicate cue type and reason): Pt reports having reacher at home, education provided on use of reacher and sock aid for LB dressing Toilet Transfer: Min guard;BSC;RW   Toileting- Clothing Manipulation and Hygiene: Minimal assistance;Sit to/from stand;Cueing for back precautions       Functional mobility during ADLs: Rolling walker;Min guard General ADL Comments: Pt needs additional time for ADL completion, and cuing to maintain back precautions. Pt states she requires more A than husband can provide at home at this time.      Vision                     Perception     Praxis      Cognition   Behavior During Therapy: [facility] for tasks assessed/performed Overall Cognitive Status: Within Functional Limits for tasks assessed                       Extremity/Trunk Assessment               Exercises     Shoulder Instructions       General Comments  Pertinent Vitals/ Pain       Pain Assessment: 0-10 Pain Score: 10-Worst pain ever Pain Location: back Pain Descriptors / Indicators: Aching Pain Intervention(s): Limited activity within patient's tolerance;Monitored during session;Repositioned;Premedicated before session  Home Living                                          Prior Functioning/Environment              Frequency Min 2X/week     Progress Toward Goals  OT Goals(current goals can now be found in the care plan section)   Progress towards OT goals: Progressing toward goals  Acute Rehab OT Goals Patient Stated Goal: to stop hurting OT Goal Formulation: With patient ADL Goals Pt Will Perform Grooming: with modified independence;standing Pt Will Perform Upper Body Dressing: with supervision;sitting Pt Will Transfer to Toilet: with supervision;bedside commode Pt Will Perform Tub/Shower Transfer: Tub transfer;with min assist;shower seat;ambulating;rolling walker Additional ADL Goal #1: Pt will verbalize 3/3 precaution in preparation for ADL tasks.  Plan Discharge plan needs to be updated    Co-evaluation                 End of Session Equipment Utilized During Treatment: Gait belt;Rolling walker;Back brace   Activity Tolerance Patient tolerated treatment well   Patient Left in chair;with call bell/phone within reach   Nurse Communication          Time: 0815-0850 OT Time Calculation (min): 35 min  Charges: OT General Charges $OT Visit: 1 Procedure OT Treatments $Self Care/Home Management : 23-37 mins  Forest Gleason 12/31/2014, 9:19 AM

## 2014-12-31 NOTE — Progress Notes (Addendum)
Physical Therapy Treatment Patient Details Name: [name] MRN: [id number] DOB: 30-Sep-1945 Today's Date: 12/31/2014    History of Present Illness 69 y.o female s/p lumbar fusion. PMH: bilateral total knee arthroplasty in 2006, shortness of breath with exertion, hypertension and arthritis    PT Comments    Patient seen for mobility progression this am. Patient with increased pain this am. Per nsg, patient had a difficulty night with multiple OOB to [facility] and difficulty with bed mobility. During session today, patient limited by pain. Patient and family concerned regarding patient mobility and ability to care for herself as she resides alone and will not have level of assist initially thought to be available. Will need ST SNF. Disposition and recommendation updated.  Follow Up Recommendations  SNF     Equipment Recommendations  Rolling walker with 5" wheels    Recommendations for Other Services       Precautions / Restrictions Precautions Precautions: Back Precaution Booklet Issued: Yes (comment) Required Braces or Orthoses: Spinal Brace Spinal Brace: Lumbar corset;Applied in sitting position Restrictions Weight Bearing Restrictions: No    Mobility  Bed Mobility               General bed mobility comments: Per nsg, patient had difficulty with technique for bed mobility  Transfers Overall transfer level: Needs assistance Equipment used: Rolling walker (2 wheeled) Transfers: Sit to/from Stand Sit to Stand: Min guard         General transfer comment: VCs for hand placement  Ambulation/Gait Ambulation/Gait assistance: Min guard Ambulation Distance (Feet): 130 Feet Assistive device: Rolling walker (2 wheeled) Gait Pattern/deviations: Step-through pattern;Decreased stride length;Drifts right/left;Narrow base of support Gait velocity: decreased Gait velocity interpretation: Below normal speed for age/gender General Gait Details: min guard, patient appears to be  in more pain and required multiple rest breaks while ambulating secondary to increased pain   Stairs Stairs: Yes Stairs assistance: Min assist Stair Management: Two rails;Step to pattern Number of Stairs: 2 General stair comments: could only tolerate 2 steps secondary to pain and fatigue  Wheelchair Mobility    Modified Rankin (Stroke Patients Only)       Balance     Sitting balance-Leahy Scale: Fair     Standing balance support: Bilateral upper extremity supported Standing balance-Leahy Scale: Fair                      Cognition Arousal/Alertness: Awake/alert Behavior During Therapy: Flat affect Overall Cognitive Status: Within Functional Limits for tasks assessed                      Exercises      General Comments        Pertinent Vitals/Pain Pain Assessment: 0-10 Pain Score: 8  Pain Location: back Pain Descriptors / Indicators: Aching;Spasm Pain Intervention(s): Limited activity within patient's tolerance;Monitored during session;Repositioned;Relaxation;Patient requesting pain meds-RN notified    Home Living                      Prior Function            PT Goals (current goals can now be found in the care plan section) Acute Rehab PT Goals Patient Stated Goal: to go home and see great grandchild PT Goal Formulation: With patient Time For Goal Achievement: 01/13/15 Potential to Achieve Goals: Good Progress towards PT goals: Progressing toward goals    Frequency  Min 5X/week    PT Plan Discharge plan needs to  be updated    Co-evaluation             End of Session Equipment Utilized During Treatment: Gait belt;Back brace Activity Tolerance: Patient limited by pain Patient left: in chair;with call bell/phone within reach     Time: 0726-0745 PT Time Calculation (min) (ACUTE ONLY): 19 min  Charges:  $Gait Training: 8-22 mins                    G CodesDuncan [name] 01-01-2015, 7:53 AM [name],  PT DPT  [phone]

## 2014-12-31 NOTE — Progress Notes (Signed)
RT called to put patient on CPAP. RT explained to RN that the patient was not on our work list and there was no order. RN stated that our CPAP was at bedside and pt had worn the night before. There is a note in the patients chart where the patient was setup and did wear out unit. I explained to the RN that I needed and order for the CPAP therapy. RN stated she would call and have this put in. Pt was placed on CPAP auto max: 20, Lo: 8 through her home tubing and mask. Sterile water was added to the humidity chamber. RT checked pt Sats and on RA = 88%. Rt bled in 2L O2 and Sats increased to 97%. Pt resting at this time. RT will monitor.

## 2014-12-31 NOTE — Clinical Social Work Note (Signed)
Consult received regarding SNF placement for ST rehab. Patient assessed (full assessment to follow) and patient agreeable.  SNF search initiated.   [name], MSW, LCSW Licensed Clinical Social Worker [place] [phone]

## 2014-12-31 NOTE — Progress Notes (Signed)
Patient ID: [name], female   DOB: 09-Jul-1946, 69 y.o.   MRN: [id number] Afeb, vss No new neuro issues More incisional pain today. She feels she may benefit from rehab/snf. Will get therapy to see her and also consult case manager.

## 2015-01-01 MED ORDER — DEXAMETHASONE 4 MG PO TABS
6.0000 mg | ORAL_TABLET | Freq: Four times a day (QID) | ORAL | Status: AC
  Administered 2015-01-01 – 2015-01-02 (×4): 6 mg via ORAL
  Filled 2015-01-01 (×8): qty 1

## 2015-01-01 MED ORDER — POLYETHYLENE GLYCOL 3350 17 G PO PACK
17.0000 g | PACK | Freq: Every day | ORAL | Status: DC
  Administered 2015-01-01: 17 g via ORAL
  Filled 2015-01-01 (×2): qty 1

## 2015-01-01 NOTE — Clinical Social Work Placement (Signed)
   CLINICAL SOCIAL WORK PLACEMENT  NOTE  Date:  01/01/2015  Patient Details  Name: Jaryn Hocutt MRN: [id number] Date of Birth: 26-Feb-1946  Clinical Social Work is seeking post-discharge placement for this patient at the [place] level of care (*CSW will initial, date and re-position this form in  chart as items are completed):  Yes   Patient/family provided with [place] Work Department's list of facilities offering this level of care within the geographic area requested by the patient (or if unable, by the patient's family).  Yes   Patient/family informed of their freedom to choose among providers that offer the needed level of care, that participate in Medicare, Medicaid or managed care program needed by the patient, have an available bed and are willing to accept the patient.  Yes   Patient/family informed of [facility]'s ownership interest in [facility] and [facility], as well as of the fact that they are under no obligation to receive care at these facilities.  PASRR submitted to EDS on 01/01/15     PASRR number received on 01/01/15     Existing PASRR number confirmed on       FL2 transmitted to all facilities in geographic area requested by pt/family on 01/01/15     FL2 transmitted to all facilities within larger geographic area on       Patient informed that his/her managed care company has contracts with or will negotiate with certain facilities, including the following:        Yes   Patient/family informed of bed offers received.  Patient chooses bed at [facility] and [place] recommends and patient chooses bed at [facility]    Patient to be transferred to [facility] on  .  Patient to be transferred to facility by       Patient family notified on   of transfer.  Name of family member notified:        PHYSICIAN Please prepare prescriptions, Please sign FL2, Please  prepare priority discharge summary, including medications     Additional Comment:    _______________________________________________ Lilly [place], LCSW 01/01/2015, 1:51 PM

## 2015-01-01 NOTE — Progress Notes (Signed)
LCSW spoke to daughter: Karna Christmas [phone] Terri reports she wants to see facility prior to admitting to SNF. LCSW explained this can be completed today and paperwork needs to be signed, but not an option tomorrow being it is a weekend.  Karna Christmas is calling her brother who will complete the tour, sign the paperwork and complete intake.  Patient is planning for Discharge tomorrow (Saturday) [facility] is aware and agreeable as long as paperwork is completed today (Friday).  LCSW will leave information for weekend SW to follow and assist with DC planning.  Lane Hacker, MSW Clinical Social Work: Emergency Room [phone]

## 2015-01-01 NOTE — Progress Notes (Signed)
Patient ID: [name], female   DOB: [birth year], 69 y.o.   MRN: [id number] Afeb, vss No new neuro issues Has some inflammatory left leg pain today; Is doing well with therapy Has a bed at [facility]; I will plan to send her there tomorrow and will give a few doses of decadron today for inflammation

## 2015-01-01 NOTE — Clinical Social Work Note (Signed)
Correction to Assessment note. [name] is not patient's daughter. Please disregard Brandy's name and phone number and reference to [facility] in assessment.  [name], MSW, LCSW Licensed Clinical Social Worker [place] [phone]

## 2015-01-01 NOTE — Progress Notes (Signed)
Patient is pending bed at [facility]. Awaiting medical clearance as in progression this morning she was running a fever. LCSW has notified facility and will let them know if discharging today.  Will continue to follow and assist with needs.  Lane Hacker, MSW Clinical Social Work: Emergency Room [phone]

## 2015-01-01 NOTE — Progress Notes (Signed)
Physical Therapy Treatment Patient Details Name: [name] MRN: [id number] DOB: 15-Aug-1945 Today's Date: 01/01/2015    History of Present Illness 69 y.o female s/p lumbar fusion. PMH: bilateral total knee arthroplasty in 2006, shortness of breath with exertion, hypertension and arthritis    PT Comments    Patient seen for mobility today, ambulated in hall with multiple rest breaks. Patient reports LLE pain and fatigue today. Patient overall moving well but demonstrates increased WOB and elevated HR with right neck bounding pulse. Nsg aware. Will continue to see and progress as tolerated.  Follow Up Recommendations  SNF     Equipment Recommendations  Rolling walker with 5" wheels    Recommendations for Other Services       Precautions / Restrictions Precautions Precautions: Back Precaution Booklet Issued: Yes (comment) Required Braces or Orthoses: Spinal Brace Spinal Brace: Lumbar corset;Applied in sitting position Restrictions Weight Bearing Restrictions: No    Mobility  Bed Mobility                  Transfers Overall transfer level: Needs assistance Equipment used: Rolling walker (2 wheeled) Transfers: Sit to/from Stand Sit to Stand: Min guard         General transfer comment: VCs for hand placement  Ambulation/Gait Ambulation/Gait assistance: Supervision Ambulation Distance (Feet): 160 Feet (x2) Assistive device: Rolling walker (2 wheeled) Gait Pattern/deviations: Step-through pattern;Decreased stride length;Drifts right/left;Narrow base of support Gait velocity: decreased Gait velocity interpretation: Below normal speed for age/gender General Gait Details: 4 standing rest breaks, patient very fatigued, patient with SOB and bounding right carotid pulse during mobility. Cued for pursed lip breathing and relaxation during rest break to aide recovery.    Stairs            Wheelchair Mobility    Modified Rankin (Stroke Patients Only)        Balance     Sitting balance-Leahy Scale: Fair       Standing balance-Leahy Scale: Fair                      Cognition Arousal/Alertness: Awake/alert Behavior During Therapy: WFL for tasks assessed/performed Overall Cognitive Status: Within Functional Limits for tasks assessed                      Exercises      General Comments General comments (skin integrity, edema, etc.): able to recall 3/3 precautions this day      Pertinent Vitals/Pain Pain Assessment: 0-10 Pain Score: 8  Pain Location: back and Left leg Pain Descriptors / Indicators: Aching Pain Intervention(s): Limited activity within patient's tolerance;Monitored during session;Repositioned;Premedicated before session    Home Living                      Prior Function            PT Goals (current goals can now be found in the care plan section) Acute Rehab PT Goals Patient Stated Goal: to go to rehab today PT Goal Formulation: With patient Time For Goal Achievement: 01/13/15 Potential to Achieve Goals: Good Progress towards PT goals: Progressing toward goals    Frequency  Min 5X/week    PT Plan Current plan remains appropriate    Co-evaluation             End of Session Equipment Utilized During Treatment: Gait belt;Back brace Activity Tolerance: Patient tolerated treatment well;No increased pain Patient left: in chair;with call bell/phone within reach  Time: 8159-4707 PT Time Calculation (min) (ACUTE ONLY): 20 min  Charges:  $Gait Training: 8-22 mins                    G CodesDuncan Dull 01-16-2015, 12:40 PM Alben Deeds, [place] DPT  [phone]

## 2015-01-01 NOTE — Clinical Social Work Note (Signed)
Clinical Social Work Assessment  Patient Details  Name: [name] MRN: [id number] Date of Birth: Aug 06, 1945  Date of referral:  12/31/14               Reason for consult:  Facility Placement                Permission sought to share information with:  Facility Sport and exercise psychologist, Family Supports Permission granted to share information::  Yes, Verbal Permission Granted  Name::     [name] and [place]::     Relationship::  Daughter  Sport and exercise psychologist Information:  [name]'s phone number: [phone].  Housing/Transportation Living arrangements for the past 2 months:  [place] of Information:  Patient Patient Interpreter Needed:  None Criminal Activity/Legal Involvement Pertinent to Current Situation/Hospitalization:  No - Comment as needed Significant Relationships:  Adult Children (Grandchildren) Lives with:  Other (Comment) Do you feel safe going back to the place where you live?    Need for family participation in patient care:  Yes (Comment)  Care giving concerns:  None mentioned by patient on 12/31/14. On 6/2, daughter [name] Rama expressed concerned regarding patient's behavior, indicating that it is not her norm.    Social Worker assessment / plan: On 12/31/14, CSW talked with patient at the bedside regarding discharge planning and recommendation of ST rehab. Patient was in bed and was alert, oriented and able to converse appropriately with CSW.  Patient reported events that led to her hospitalization and understands that she had a stroke and is currently weak and will need help. Patient in agreement with short-term rehab and process explained and skilled facility list provided. Ms. Harshman reported that her daughter and son-in-law [name] and [name] and their children live with her. Patient explained that prior to hospitalization she was independent, driving and helping look after her grandchildren. Patient expressed wanting to get  better and back home.  Employment status:  Retired Nurse, adult PT Recommendations:  Anthony / Referral to community resources:  Other (Comment Required) (Not requested or needed at this time)  Patient/Family's Response to care:  Patient did not speak negatively regarding care being received.  Patient/Family's Understanding of and Emotional Response to Diagnosis, Current Treatment, and Prognosis:  Not discussed by patient.  Emotional Assessment Appearance:  Appears stated age Attitude/Demeanor/Rapport:   (Appropriate for Interaction) Affect (typically observed):  Appropriate Orientation:  Oriented to Self, Oriented to Place, Oriented to Situation, Oriented to  Time Alcohol / Substance use:  Never Used Psych involvement (Current and /or in the community):  No (Comment)  Discharge Needs  Concerns to be addressed:  Discharge Planning Concerns Readmission within the last 30 days:  No Current discharge risk:  None Barriers to Discharge:  No Barriers Identified   [name] Feil, LCSW 01/01/2015, 2:02 PM

## 2015-01-01 NOTE — Progress Notes (Signed)
Patient not ready for CPAP at this time. Stated she will call when ready to go on.

## 2015-01-01 NOTE — Clinical Social Work Note (Addendum)
Contact made to Dr. Sande Rives office regarding signing of the FL-2. Faxed FL-2 to office (attention Larence Penning) for MD's signature. FL-2 was to be faxed back to Clinical Social Work office.   Srija Southard Givens, MSW, LCSW Licensed Clinical Social Worker [place] [phone]

## 2015-01-02 MED ORDER — OXYCODONE-ACETAMINOPHEN 5-325 MG PO TABS: 1.0000 | ORAL_TABLET | ORAL | Status: DC | PRN

## 2015-01-02 NOTE — Discharge Summary (Signed)
Physician Discharge Summary  Patient ID: [name] MRN: [id number] DOB/AGE: 05-May-1946 69 y.o.  Admit date: 12/29/2014 Discharge date: 01/02/2015  Admission Diagnoses:  Discharge Diagnoses:  Active Problems:   Spondylolisthesis, acquired   Discharged Condition: good  Hospital Course: Surgery 3 days ago for 2 level plif. Did well with good pain relief. By pod 3, ambulating well. Wound clean and dry. Home with specific instructions given.  Consults: None  Significant Diagnostic Studies: none  Treatments: surgery: L 34 L 45 plif  Discharge Exam: Blood pressure 148/68, pulse 70, temperature 98.4 F (36.9 C), temperature source Oral, resp. rate 16, height 5\' 6"  (1.676 m), weight 131.5 kg (289 lb 14.5 oz), SpO2 98 %. Incision/Wound:clean and dry; no focal weakness  Disposition: 01-Home or Self Care     Medication List    ASK your doctor about these medications        albuterol 108 (90 BASE) MCG/ACT inhaler  Commonly known as:  PROVENTIL HFA;VENTOLIN HFA  Inhale 2 puffs into the lungs every 6 (six) hours as needed for wheezing.     cholecalciferol 1000 UNITS tablet  Commonly known as:  VITAMIN D  Take 1,000 Units by mouth daily.     clopidogrel 75 MG tablet  Commonly known as:  PLAVIX  Take 75 mg by mouth daily.     escitalopram 10 MG tablet  Commonly known as:  LEXAPRO  Take 10 mg by mouth daily.     fish oil-omega-3 fatty acids 1000 MG capsule  Take 1 capsule by mouth 2 (two) times daily.     furosemide 20 MG tablet  Commonly known as:  LASIX  Take 20 mg by mouth daily as needed for fluid.     gabapentin 100 MG capsule  Commonly known as:  NEURONTIN  Take 100 mg by mouth 3 (three) times daily.     HYDROcodone-acetaminophen 10-325 MG per tablet  Commonly known as:  NORCO  Take 1 tablet by mouth every 6 (six) hours as needed.     irbesartan 300 MG tablet  Commonly known as:  AVAPRO  Take 300 mg by mouth daily.     niacin 500 MG CR tablet   Commonly known as:  NIASPAN  Take 500 mg by mouth 2 (two) times daily.     oxyCODONE-acetaminophen 5-325 MG per tablet  Commonly known as:  PERCOCET/ROXICET  Take 1 tablet by mouth every 4 (four) hours as needed for severe pain.     tamoxifen 20 MG tablet  Commonly known as:  NOLVADEX  Take 1 tablet by mouth  daily     Tapentadol HCl 100 MG Tb12  Take 1 tablet by mouth 2 (two) times daily.     verapamil 240 MG 24 hr capsule  Commonly known as:  VERELAN PM  Take 240 mg by mouth daily.         At home rest most of the time. Get up 9 or 10 times each day and take a 15 or 20 minute walk. No riding in the car and to your first postoperative appointment. If you have neck surgery you may shower from the chest down starting on the third postoperative day. If you had back surgery he may start showering on the third postoperative day with saran wrap wrapped around your incisional area 3 times. After the shower remove the saran wrap. Take pain medicine as needed and other medications as instructed. Call my office for an appointment.  SignedFaythe Ghee, MD 01/02/2015, 7:42  AM

## 2015-01-02 NOTE — Progress Notes (Signed)
Discharge orders received, pt for discharge today to [facility].  IV D/C.  D/C instructions and Rx in packet for receiving facility and report called to SNF.  Family at the bedside to assist with discharge. PTAR brought pt downstairs via stretcher.

## 2015-01-06 ENCOUNTER — Non-Acute Institutional Stay (SKILLED_NURSING_FACILITY): Payer: Medicare Other | Admitting: Internal Medicine

## 2015-01-06 ENCOUNTER — Encounter: Payer: Self-pay | Admitting: Internal Medicine

## 2015-01-06 DIAGNOSIS — M4319 Spondylolisthesis, multiple sites in spine: Secondary | ICD-10-CM

## 2015-01-06 DIAGNOSIS — C50412 Malignant neoplasm of upper-outer quadrant of left female breast: Secondary | ICD-10-CM | POA: Diagnosis not present

## 2015-01-06 DIAGNOSIS — I10 Essential (primary) hypertension: Secondary | ICD-10-CM

## 2015-01-06 DIAGNOSIS — E785 Hyperlipidemia, unspecified: Secondary | ICD-10-CM

## 2015-01-06 DIAGNOSIS — M4310 Spondylolisthesis, site unspecified: Secondary | ICD-10-CM

## 2015-01-06 NOTE — Assessment & Plan Note (Signed)
Cont avapro 300 mg, verapamil 240, and lasix 20 mg prn

## 2015-01-06 NOTE — Progress Notes (Signed)
MRN: [id number] Name: [name]  Sex: female Age: 69 y.o. DOB: [birth year]  [place] #:[facility]  Facility/Room:107FULL Level Of Care: SNF Provider: Inocencio Homes D Emergency Contacts: Extended Emergency Contact Information Primary Emergency Contact: Custer,David Address: [place] [address]          [address] [address] [address] Phone: [phone] Mobile Phone: [phone] Relation: Spouse Secondary Emergency Contact: Glennis Brink [place] Mobile Phone: [phone] Relation: Son  Code Status: FULL  Allergies: Cymbalta; Lipitor; Maxzide; Pravastatin sodium; and Lodine  Chief Complaint  Patient presents with  . New Admit To SNF    HPI: Patient is 69 y.o. female who is s/p two level lumbar fusion admitted to SNF for OT/PT.  Past Medical History  Diagnosis Date  . OSA (obstructive sleep apnea)   . Hypertension   . Hyperlipidemia   . Headache(784.0)   . Blood transfusion   . Pneumonia 4/12  . Anemia   . Anxiety   . Depression     Dr.Lewit  . Diverticulosis   . Radiation 10/29/11-11/24/11    left breast 6100 cGy  . Tinnitus   . History of colon polyps 1998    adenomatous  . Spinal stenosis   . Bronchitis     hx of  . Shortness of breath     with exertion  . Arthritis   . Breast cancer 2013    left breast    Past Surgical History  Procedure Laterality Date  . Total knee arthroplasty  2006    bilateral  . Polypectomy  1990's  . Appendectomy  1978  . Thyroid cyst excision  1994  . Transphenoidal / transnasal hypophysectomy / resection pituitary tumor  6/11  . Belpharoptosis repair    . Breast lumpectomy  08/2011    left  . Joint replacement Bilateral     bilateral knee  . Colonoscopy w/ polypectomy    . Breast lumpectomy with needle localization Right 02/06/2013    Procedure: RIGHT BREAST LUMPECTOMY WITH NEEDLE LOCALIZATION;  Surgeon: Harl Bowie, MD;  Location: [facility];  Service: General;  Laterality:  Right;      Medication List       This list is accurate as of: 01/06/15 11:59 PM.  Always use your most recent med list.               albuterol 108 (90 BASE) MCG/ACT inhaler  Commonly known as:  PROVENTIL HFA;VENTOLIN HFA  Inhale 2 puffs into the lungs every 6 (six) hours as needed for wheezing.     cholecalciferol 1000 UNITS tablet  Commonly known as:  VITAMIN D  Take 1,000 Units by mouth daily.     escitalopram 10 MG tablet  Commonly known as:  LEXAPRO  Take 10 mg by mouth daily.     fish oil-omega-3 fatty acids 1000 MG capsule  Take 1 capsule by mouth 2 (two) times daily.     furosemide 20 MG tablet  Commonly known as:  LASIX  Take 20 mg by mouth daily as needed for fluid.     gabapentin 100 MG capsule  Commonly known as:  NEURONTIN  Take 100 mg by mouth 3 (three) times daily.     irbesartan 300 MG tablet  Commonly known as:  AVAPRO  Take 300 mg by mouth daily.     niacin 500 MG CR tablet  Commonly known as:  NIASPAN  Take 500 mg by mouth 2 (two) times daily.  oxyCODONE-acetaminophen 5-325 MG per tablet  Commonly known as:  PERCOCET/ROXICET  Take 1-2 tablets by mouth every 4 (four) hours as needed for moderate pain.     tamoxifen 20 MG tablet  Commonly known as:  NOLVADEX  Take 1 tablet by mouth  daily     Tapentadol HCl 100 MG Tb12  Take 1 tablet by mouth 2 (two) times daily.     verapamil 240 MG 24 hr capsule  Commonly known as:  VERELAN PM  Take 240 mg by mouth daily.        No orders of the defined types were placed in this encounter.    Immunization History  Administered Date(s) Administered  . Influenza Whole 07/01/2011    History  Substance Use Topics  . Smoking status: Never Smoker   . Smokeless tobacco: Never Used  . Alcohol Use: No    Family history is noncontributory    Review of Systems  DATA OBTAINED: from patient, nurse GENERAL:  no fevers, fatigue, appetite changes SKIN: No itching, rash or wounds EYES: No eye  pain, redness, discharge EARS: No earache, tinnitus, change in hearing NOSE: No congestion, drainage or bleeding  MOUTH/THROAT: No mouth or tooth pain, No sore throat RESPIRATORY: No cough, wheezing, SOB CARDIAC: No chest pain, palpitations, lower extremity edema  GI: No abdominal pain, No N/V/D or constipation, No heartburn or reflux  GU: No dysuria, frequency or urgency, or incontinence  MUSCULOSKELETAL: No unrelieved bone/joint pain NEUROLOGIC: No headache, dizziness or focal weakness PSYCHIATRIC: No overt anxiety or sadness, No behavior issue.   Filed Vitals:   01/06/15 1108  BP: 147/90  Pulse: 99  Temp: 99.3 F (37.4 C)  Resp: 18    Physical Exam  GENERAL APPEARANCE: Alert, conversant,  No acute distress.  SKIN: No diaphoresis rash;pt in post -op binder, did not visualize op site HEAD: Normocephalic, atraumatic  EYES: Conjunctiva/lids clear. Pupils round, reactive. EOMs intact.  EARS: External exam WNL, canals clear. Hearing grossly normal.  NOSE: No deformity or discharge.  MOUTH/THROAT: Lips w/o lesions  RESPIRATORY: Breathing is even, unlabored. Lung sounds are clear   CARDIOVASCULAR: Heart RRR no murmurs, rubs or gallops. No peripheral edema.   GASTROINTESTINAL: pt in post op binder. GENITOURINARY: Bladder non tender, not distended  MUSCULOSKELETAL: No abnormal joints or musculature NEUROLOGIC:  Cranial nerves 2-12 grossly intact. Moves all extremities  PSYCHIATRIC: Mood and affect appropriate to situation, no behavioral issues  Patient Active Problem List   Diagnosis Date Noted  . Spondylolisthesis, acquired 12/29/2014  . Spinal stenosis 08/13/2014  . Pituitary macroadenoma 08/13/2014  . Hot flashes due to tamoxifen 12/11/2013  . Postmenopausal estrogen deficiency 12/11/2013  . Breast cancer of upper-outer quadrant of left female breast 06/12/2013  . Dyspnea 02/11/2013  . Lactic acidosis 02/11/2013  . Acute bronchitis 02/11/2013  . Obesity 02/11/2013  .  Breast calcifications on mammogram 01/29/2013  . Hyperparathyroidism, primary 01/29/2013  . OSA (obstructive sleep apnea) 01/30/2011  . Hyperlipidemia 09/20/2007  . Essential hypertension 09/20/2007    CBC    Component Value Date/Time   WBC 9.5 12/23/2014 1457   WBC 6.8 08/06/2014 0810   RBC 4.34 12/23/2014 1457   RBC 3.93 08/06/2014 0810   HGB 11.1* 12/23/2014 1457   HGB 10.5* 08/06/2014 0810   HCT 34.4* 12/23/2014 1457   HCT 32.7* 08/06/2014 0810   PLT 273 12/23/2014 1457   PLT 205 08/06/2014 0810   MCV 79.3 12/23/2014 1457   MCV 83.2 08/06/2014 0810   LYMPHSABS  1.7 08/06/2014 0810   LYMPHSABS 1.9 02/10/2013 2100   MONOABS 0.5 08/06/2014 0810   MONOABS 0.7 02/10/2013 2100   EOSABS 0.1 08/06/2014 0810   EOSABS 0.2 02/10/2013 2100   BASOSABS 0.0 08/06/2014 0810   BASOSABS 0.0 02/10/2013 2100    CMP     Component Value Date/Time   NA 140 12/23/2014 1457   NA 146* 08/06/2014 0810   K 3.7 12/23/2014 1457   K 4.0 08/06/2014 0810   CL 104 12/23/2014 1457   CL 105 12/09/2012 1423   CO2 24 12/23/2014 1457   CO2 26 08/06/2014 0810   GLUCOSE 116* 12/23/2014 1457   GLUCOSE 116 08/06/2014 0810   GLUCOSE 177* 12/09/2012 1423   BUN 11 12/23/2014 1457   BUN 14.2 08/06/2014 0810   CREATININE 1.02* 12/23/2014 1457   CREATININE 1.2* 08/06/2014 0810   CALCIUM 10.2 12/23/2014 1457   CALCIUM 9.8 08/06/2014 0810   CALCIUM 9.9 12/09/2012 1423   PROT 6.4 08/06/2014 0810   PROT 6.5 01/17/2012 1431   ALBUMIN 3.2* 08/06/2014 0810   ALBUMIN 3.8 01/17/2012 1431   AST 9 08/06/2014 0810   AST 11 01/17/2012 1431   ALT 11 08/06/2014 0810   ALT 19 01/17/2012 1431   ALKPHOS 52 08/06/2014 0810   ALKPHOS 63 01/17/2012 1431   BILITOT 0.41 08/06/2014 0810   BILITOT 0.3 01/17/2012 1431   GFRNONAA 54* 12/23/2014 1457   GFRAA >60 12/23/2014 1457    Assessment and Plan  Spondylolisthesis, acquired two-level posterior lumbar interbody fusion with pedicle screw fixation due to the  listhesis and significant stenosis at both levels, L 3-4, L 4-5  Essential hypertension Cont avapro 300 mg, verapamil 240, and lasix 20 mg prn  Hyperlipidemia Statin intolerant;niaspan 500 mg BID and omega 3 fatty acids  Breast cancer of upper-outer quadrant of left female breast Pt on tamoxifen    Hennie Duos, MD

## 2015-01-06 NOTE — Assessment & Plan Note (Signed)
Pt on tamoxifen

## 2015-01-06 NOTE — Assessment & Plan Note (Signed)
two-level posterior lumbar interbody fusion with pedicle screw fixation due to the listhesis and significant stenosis at both levels, L 3-4, L 4-5

## 2015-01-06 NOTE — Assessment & Plan Note (Addendum)
Statin intolerant;niaspan 500 mg BID and omega 3 fatty acids

## 2015-01-09 ENCOUNTER — Encounter: Payer: Self-pay | Admitting: Internal Medicine

## 2015-01-18 ENCOUNTER — Encounter: Payer: Self-pay | Admitting: Internal Medicine

## 2015-01-18 ENCOUNTER — Non-Acute Institutional Stay (SKILLED_NURSING_FACILITY): Payer: Medicare Other | Admitting: Internal Medicine

## 2015-01-18 DIAGNOSIS — I10 Essential (primary) hypertension: Secondary | ICD-10-CM | POA: Diagnosis not present

## 2015-01-18 DIAGNOSIS — E785 Hyperlipidemia, unspecified: Secondary | ICD-10-CM | POA: Diagnosis not present

## 2015-01-18 DIAGNOSIS — M4319 Spondylolisthesis, multiple sites in spine: Secondary | ICD-10-CM

## 2015-01-18 DIAGNOSIS — M4310 Spondylolisthesis, site unspecified: Secondary | ICD-10-CM

## 2015-01-18 DIAGNOSIS — C50412 Malignant neoplasm of upper-outer quadrant of left female breast: Secondary | ICD-10-CM | POA: Diagnosis not present

## 2015-01-18 NOTE — Progress Notes (Signed)
Patient ID: [name], female   DOB: Dec 26, 1945, 69 y.o.   MRN: [id number] MRN: [id number] Name: [name]  Sex: female Age: 69 y.o. DOB: 01/01/1946  [facility] #:[facility]  Facility/Room:107FULL Level Of Care: SNF Provider: Wille Celeste Emergency Contacts: Extended Emergency Contact Information Primary Emergency Contact: [name] Address: [address]          [place] [address] Johnnette Litter of [place] Phone: [phone] Mobile Phone: [phone] Relation: Spouse Secondary Emergency Contact: [name] [place] of [place] Mobile Phone: [phone] Relation: Son  Code Status: FULL  Allergies: Cymbalta; Lipitor; Maxzide; Pravastatin sodium; and Lodine  Chief Complaint  Patient presents with  . Discharge Note    HPI: Patient is 69 y.o. female who is s/p two level lumbar fusion admitted to SNF for OT/PT.--she has done quite well with rehabilitation she is now ambulating with a walker-she will need continued PT and OT at home for rehabilitation.  Nursing staff does not report any recent issues-she is receiving Percocet for pain which apparently is helping.  She apparently has follow-up with neurosurgery tomorrow  Past Medical History  Diagnosis Date  . OSA (obstructive sleep apnea)   . Hypertension   . Hyperlipidemia   . Headache(784.0)   . Blood transfusion   . Pneumonia 4/12  . Anemia   . Anxiety   . Depression     Dr.Lewit  . Diverticulosis   . Radiation 10/29/11-11/24/11    left breast 6100 cGy  . Tinnitus   . History of colon polyps 1998    adenomatous  . Spinal stenosis   . Bronchitis     hx of  . Shortness of breath     with exertion  . Arthritis   . Breast cancer 2013    left breast    Past Surgical History  Procedure Laterality Date  . Total knee arthroplasty  2006    bilateral  . Polypectomy  1990's  . Appendectomy  1978  . Thyroid cyst excision  1994  . Transphenoidal / transnasal hypophysectomy /  resection pituitary tumor  6/11  . Belpharoptosis repair    . Breast lumpectomy  08/2011    left  . Joint replacement Bilateral     bilateral knee  . Colonoscopy w/ polypectomy    . Breast lumpectomy with needle localization Right 02/06/2013    Procedure: RIGHT BREAST LUMPECTOMY WITH NEEDLE LOCALIZATION;  Surgeon: Harl Bowie, MD;  Location: [facility];  Service: General;  Laterality: Right;      Medication List       This list is accurate as of: 01/18/15  3:04 PM.  Always use your most recent med list.               albuterol 108 (90 BASE) MCG/ACT inhaler  Commonly known as:  PROVENTIL HFA;VENTOLIN HFA  Inhale 2 puffs into the lungs every 6 (six) hours as needed for wheezing.     cholecalciferol 1000 UNITS tablet  Commonly known as:  VITAMIN D  Take 1,000 Units by mouth daily.     escitalopram 10 MG tablet  Commonly known as:  LEXAPRO  Take 10 mg by mouth daily.     fish oil-omega-3 fatty acids 1000 MG capsule  Take 1 capsule by mouth 2 (two) times daily.     furosemide 20 MG tablet  Commonly known as:  LASIX  Take 20 mg by mouth every other day.     gabapentin 100 MG capsule  Commonly known as:  NEURONTIN  Take 100 mg by mouth 3 (three) times daily.     irbesartan 300 MG tablet  Commonly known as:  AVAPRO  Take 300 mg by mouth daily.     niacin 500 MG CR tablet  Commonly known as:  NIASPAN  Take 500 mg by mouth 2 (two) times daily.     oxyCODONE-acetaminophen 5-325 MG per tablet  Commonly known as:  PERCOCET/ROXICET  Take 1-2 tablets by mouth every 4 (four) hours as needed for moderate pain.     tamoxifen 20 MG tablet  Commonly known as:  NOLVADEX  Take 1 tablet by mouth  daily     Tapentadol HCl 100 MG Tb12  Take 1 tablet by mouth 2 (two) times daily.     verapamil 240 MG 24 hr capsule  Commonly known as:  VERELAN PM  Take 240 mg by mouth daily.       of note she is on Plavix 75 mg a day as well she has been on this long-term  No orders of the  defined types were placed in this encounter.    Immunization History  Administered Date(s) Administered  . Influenza Whole 07/01/2011    History  Substance Use Topics  . Smoking status: Never Smoker   . Smokeless tobacco: Never Used  . Alcohol Use: No    Family history is noncontributory    Review of Systems  DATA OBTAINED: from patient, nurse GENERAL:  no fevers, fatigue, appetite changes SKIN: No itching, rash or wounds--surgical site low back appears to be healing unremarkably EYES: No eye pain, redness, discharge EARS: No earache, tinnitus, change in hearing NOSE: No congestion, drainage or bleeding  MOUTH/THROAT: No mouth or tooth pain, No sore throat RESPIRATORY: No cough, wheezing, SOB CARDIAC: No chest pain, palpitations,  Somewhat chronic lower extremity edema --she says this has improved GI: No abdominal pain, No N/V/D or constipation, No heartburn or reflux  GU: No dysuria, frequency or urgency, or incontinence  MUSCULOSKELETAL: No unrelieved bone/joint pain NEUROLOGIC: No headache, dizziness or focal weakness complains of some neuropathic symptoms feet bilaterally she says this is not new PSYCHIATRIC: No overt anxiety or sadness, No behavior issue.   Filed Vitals:   01/18/15 1442  BP: 134/68  Pulse: 80  Temp: 98.1 F (36.7 C)  Resp: 20    Physical Exam  GENERAL APPEARANCE: Alert, conversant,  No acute distress.  SKIN: No diaphoresis rash;pt in post -op binder, surgical site low mid back appears to be healing unremarkably with no signs of drainage infection erythema or significant tenderness there is some well healed crusting HEAD: Normocephalic, atraumatic  EYES: Conjunctiva/lids clear. Pupils round, reactive. EOMs intact.  EARS: External exam WNL, canals clear. Hearing grossly normal.  NOSE: No deformity or discharge.  MOUTH/THROAT: Oropharynx clear mucous membranes moist  RESPIRATORY: Breathing is even, unlabored. Lung sounds are clear    CARDIOVASCULAR: Heart RRR no murmurs, rubs or gallops. mild peripheral edema--she says this has improved-she has compression hose on-pedal pulses are intact bilaterally.   GASTROINTESTINAL: pt in post op binder-abdomen is obese soft nontender positive bowel sounds.   MUSCULOSKELETAL: No abnormal joints or musculature does ambulate with a rolling walker NEUROLOGIC:  Cranial nerves 2-12 grossly intact. Moves all extremities  PSYCHIATRIC: Mood and affect appropriate to situation, no behavioral issues  Patient Active Problem List   Diagnosis Date Noted  . Spondylolisthesis, acquired 12/29/2014  . Spinal stenosis 08/13/2014  . Pituitary macroadenoma 08/13/2014  . Hot flashes due to tamoxifen  12/11/2013  . Postmenopausal estrogen deficiency 12/11/2013  . Breast cancer of upper-outer quadrant of left female breast 06/12/2013  . Dyspnea 02/11/2013  . Lactic acidosis 02/11/2013  . Acute bronchitis 02/11/2013  . Obesity 02/11/2013  . Breast calcifications on mammogram 01/29/2013  . Hyperparathyroidism, primary 01/29/2013  . OSA (obstructive sleep apnea) 01/30/2011  . Hyperlipidemia 09/20/2007  . Essential hypertension 09/20/2007    CBC    Component Value Date/Time   WBC 9.5 12/23/2014 1457   WBC 6.8 08/06/2014 0810   RBC 4.34 12/23/2014 1457   RBC 3.93 08/06/2014 0810   HGB 11.1* 12/23/2014 1457   HGB 10.5* 08/06/2014 0810   HCT 34.4* 12/23/2014 1457   HCT 32.7* 08/06/2014 0810   PLT 273 12/23/2014 1457   PLT 205 08/06/2014 0810   MCV 79.3 12/23/2014 1457   MCV 83.2 08/06/2014 0810   LYMPHSABS 1.7 08/06/2014 0810   LYMPHSABS 1.9 02/10/2013 2100   MONOABS 0.5 08/06/2014 0810   MONOABS 0.7 02/10/2013 2100   EOSABS 0.1 08/06/2014 0810   EOSABS 0.2 02/10/2013 2100   BASOSABS 0.0 08/06/2014 0810   BASOSABS 0.0 02/10/2013 2100    CMP     Component Value Date/Time   NA 140 12/23/2014 1457   NA 146* 08/06/2014 0810   K 3.7 12/23/2014 1457   K 4.0 08/06/2014 0810   CL 104  12/23/2014 1457   CL 105 12/09/2012 1423   CO2 24 12/23/2014 1457   CO2 26 08/06/2014 0810   GLUCOSE 116* 12/23/2014 1457   GLUCOSE 116 08/06/2014 0810   GLUCOSE 177* 12/09/2012 1423   BUN 11 12/23/2014 1457   BUN 14.2 08/06/2014 0810   CREATININE 1.02* 12/23/2014 1457   CREATININE 1.2* 08/06/2014 0810   CALCIUM 10.2 12/23/2014 1457   CALCIUM 9.8 08/06/2014 0810   CALCIUM 9.9 12/09/2012 1423   PROT 6.4 08/06/2014 0810   PROT 6.5 01/17/2012 1431   ALBUMIN 3.2* 08/06/2014 0810   ALBUMIN 3.8 01/17/2012 1431   AST 9 08/06/2014 0810   AST 11 01/17/2012 1431   ALT 11 08/06/2014 0810   ALT 19 01/17/2012 1431   ALKPHOS 52 08/06/2014 0810   ALKPHOS 63 01/17/2012 1431   BILITOT 0.41 08/06/2014 0810   BILITOT 0.3 01/17/2012 1431   GFRNONAA 54* 12/23/2014 1457   GFRAA >60 12/23/2014 1457    Assessment and Plan #1-history of spondylolisthesis-acquired-with 2 level posterior lumbar interbody fusion-this appears to be stable she has done well with rehabilitation will have follow-up she is receiving Percocet for pain and apparently this is helping.continues on Nucynta as well  #2 history of hypertension she is on Avapro 300 mg a day verapamil 240 a day and Lasix 20 mg every other day-this appears to be somewhat variable  most recent blood pressures 134/68 154/85 the highest one that I see recently is 162/90-I also see 124/68-since she is about to be discharged will defer follow-up to primary care provider Since she is now on Lasix 3 times a week will update a metabolic panel before discharge to make sure electrolytes and renal function are stable.  #3 history of hyperlipidemia she is listed as statin intolerant-she is on Niaspan and omega-3 fatty acids-since her stay here is been relatively short will defer to primary care provider.  History of breast cancer of upper outer quadrant of left female breast-she continues on tamoxifen.  .  #5-history depression this appears stable she is on  Lexapro.  #6-history neuropathy she is on Neurontin 100 mg 3 times  a day  Patient will need PT and OT for further strengthening with her history of the recent lumbar fusion-as well as home health nursing support for her multiple medical issues including pain management and hypertension.  [id number] note greater than 30 minutes spent preparing this discharge summary-scripts have been written.  -   Josealberto Montalto C,

## 2015-01-25 ENCOUNTER — Other Ambulatory Visit: Payer: Self-pay

## 2015-03-22 ENCOUNTER — Other Ambulatory Visit: Payer: Self-pay | Admitting: Neurosurgery

## 2015-03-22 DIAGNOSIS — M4316 Spondylolisthesis, lumbar region: Secondary | ICD-10-CM

## 2015-03-26 ENCOUNTER — Ambulatory Visit
Admission: RE | Admit: 2015-03-26 | Discharge: 2015-03-26 | Disposition: A | Discharge status: Home / Self Care | Payer: Medicare Other | Source: Ambulatory Visit | Attending: Neurosurgery | Admitting: Neurosurgery

## 2015-03-26 DIAGNOSIS — M4316 Spondylolisthesis, lumbar region: Secondary | ICD-10-CM

## 2015-04-01 ENCOUNTER — Other Ambulatory Visit: Payer: Self-pay | Admitting: Neurosurgery

## 2015-04-08 ENCOUNTER — Telehealth: Payer: Self-pay | Admitting: Internal Medicine

## 2015-04-08 ENCOUNTER — Other Ambulatory Visit (HOSPITAL_BASED_OUTPATIENT_CLINIC_OR_DEPARTMENT_OTHER): Payer: Medicare Other

## 2015-04-08 DIAGNOSIS — E210 Primary hyperparathyroidism: Secondary | ICD-10-CM

## 2015-04-08 DIAGNOSIS — C50412 Malignant neoplasm of upper-outer quadrant of left female breast: Secondary | ICD-10-CM | POA: Diagnosis not present

## 2015-04-08 DIAGNOSIS — G4733 Obstructive sleep apnea (adult) (pediatric): Secondary | ICD-10-CM

## 2015-04-08 DIAGNOSIS — I10 Essential (primary) hypertension: Secondary | ICD-10-CM

## 2015-04-08 LAB — CBC WITH DIFFERENTIAL/PLATELET
BASO%: 0.5 % (ref 0.0–2.0)
Basophils Absolute: 0 10*3/uL (ref 0.0–0.1)
EOS%: 1.6 % (ref 0.0–7.0)
Eosinophils Absolute: 0.1 10*3/uL (ref 0.0–0.5)
HEMATOCRIT: 34.2 % — AB (ref 34.8–46.6)
HGB: 10.9 g/dL — ABNORMAL LOW (ref 11.6–15.9)
LYMPH#: 1.8 10*3/uL (ref 0.9–3.3)
LYMPH%: 23.2 % (ref 14.0–49.7)
MCH: 24.9 pg — ABNORMAL LOW (ref 25.1–34.0)
MCHC: 31.8 g/dL (ref 31.5–36.0)
MCV: 78.3 fL — ABNORMAL LOW (ref 79.5–101.0)
MONO#: 0.4 10*3/uL (ref 0.1–0.9)
MONO%: 5.1 % (ref 0.0–14.0)
NEUT%: 69.6 % (ref 38.4–76.8)
NEUTROS ABS: 5.4 10*3/uL (ref 1.5–6.5)
PLATELETS: 260 10*3/uL (ref 145–400)
RBC: 4.36 10*6/uL (ref 3.70–5.45)
RDW: 17.5 % — ABNORMAL HIGH (ref 11.2–14.5)
WBC: 7.8 10*3/uL (ref 3.9–10.3)

## 2015-04-08 LAB — COMPREHENSIVE METABOLIC PANEL (CC13)
ALT: 12 U/L (ref 0–55)
ANION GAP: 12 meq/L — AB (ref 3–11)
AST: 11 U/L (ref 5–34)
Albumin: 3.4 g/dL — ABNORMAL LOW (ref 3.5–5.0)
Alkaline Phosphatase: 71 U/L (ref 40–150)
BUN: 14.6 mg/dL (ref 7.0–26.0)
CALCIUM: 10.2 mg/dL (ref 8.4–10.4)
CHLORIDE: 108 meq/L (ref 98–109)
CO2: 23 meq/L (ref 22–29)
Creatinine: 1.2 mg/dL — ABNORMAL HIGH (ref 0.6–1.1)
EGFR: 55 mL/min/{1.73_m2} — ABNORMAL LOW (ref >90)
Glucose: 160 mg/dl — ABNORMAL HIGH (ref 70–140)
Potassium: 4 mEq/L (ref 3.5–5.1)
Sodium: 142 mEq/L (ref 136–145)
TOTAL PROTEIN: 7 g/dL (ref 6.4–8.3)
Total Bilirubin: 0.53 mg/dL (ref 0.20–1.20)

## 2015-04-08 NOTE — Telephone Encounter (Signed)
Patient will need an office visit. She last saw Dr. Olevia Perches in 2013. She will need a new GI MD.

## 2015-04-13 ENCOUNTER — Encounter (HOSPITAL_COMMUNITY)
Admission: RE | Admit: 2015-04-13 | Discharge: 2015-04-13 | Disposition: A | Discharge status: Home / Self Care | Payer: Medicare Other | Source: Ambulatory Visit | Attending: Neurosurgery | Admitting: Neurosurgery

## 2015-04-13 ENCOUNTER — Encounter (HOSPITAL_COMMUNITY): Payer: Self-pay

## 2015-04-13 DIAGNOSIS — Z01812 Encounter for preprocedural laboratory examination: Secondary | ICD-10-CM | POA: Diagnosis not present

## 2015-04-13 DIAGNOSIS — M549 Dorsalgia, unspecified: Secondary | ICD-10-CM | POA: Diagnosis not present

## 2015-04-13 LAB — SURGICAL PCR SCREEN
MRSA, PCR: NEGATIVE
Staphylococcus aureus: NEGATIVE

## 2015-04-13 NOTE — Pre-Procedure Instructions (Signed)
Avionna Bower  04/13/2015      [facility], [place] [address] [place] [address] Phone: [phone] Fax: [phone]  [facility], [place] [address] [place] [address] [place] [address] Phone: [phone] Fax: [phone]    Your procedure is scheduled on Tues, Sept 20 @ 7:30 AM  Report to [facility] at 5:30 AM  Call this number if you have problems the morning of surgery:  [phone]   Remember:  Do not eat food or drink liquids after midnight.  Take these medicines the morning of surgery with A SIP OF WATER Albuterol<Bring Your Inhaler With You>,Alprazolam(Xanax),Lexapro(Escitalopram),and Pain Pill(if needed)              Stop taking your Plavix and Fish Oil. No Goody's,BC's,Aleve,Aspirin,Ibuprofen,or any Herbal Medications.    Do not wear jewelry, make-up or nail polish.  Do not wear lotions, powders, or perfumes.  You may wear deodorant.  Do not shave 48 hours prior to surgery.    Do not bring valuables to the hospital.  [facility] is not responsible for any belongings or valuables.  Contacts, dentures or bridgework may not be worn into surgery.  Leave your suitcase in the car.  After surgery it may be brought to your room.  For patients admitted to the hospital, discharge time will be determined by your treatment team.  Patients discharged the day of surgery will not be allowed to drive home.    Special instructions:  [facility] - Preparing for Surgery  Before surgery, you can play an important role.  Because skin is not sterile, your skin needs to be as free of germs as possible.  You can reduce the number of germs on you skin by washing with CHG (chlorahexidine gluconate) soap before surgery.  CHG is an antiseptic cleaner which kills germs and bonds with the skin to continue killing germs even after washing.  Please DO  NOT use if you have an allergy to CHG or antibacterial soaps.  If your skin becomes reddened/irritated stop using the CHG and inform your nurse when you arrive at Short Stay.  Do not shave (including legs and underarms) for at least 48 hours prior to the first CHG shower.  You may shave your face.  Please follow these instructions carefully:   1.  Shower with CHG Soap the night before surgery and the                                morning of Surgery.  2.  If you choose to wash your hair, wash your hair first as usual with your       normal shampoo.  3.  After you shampoo, rinse your hair and body thoroughly to remove the                      Shampoo.  4.  Use CHG as you would any other liquid soap.  You can apply chg directly       to the skin and wash gently with scrungie or a clean washcloth.  5.  Apply the CHG Soap to your body ONLY FROM THE NECK DOWN.        Do not use on open wounds or open sores.  Avoid contact with your eyes,  ears, mouth and genitals (private parts).  Wash genitals (private parts)       with your normal soap.  6.  Wash thoroughly, paying special attention to the area where your surgery        will be performed.  7.  Thoroughly rinse your body with warm water from the neck down.  8.  DO NOT shower/wash with your normal soap after using and rinsing off       the CHG Soap.  9.  Pat yourself dry with a clean towel.            10.  Wear clean pajamas.            11.  Place clean sheets on your bed the night of your first shower and do not        sleep with pets.  Day of Surgery  Do not apply any lotions/deoderants the morning of surgery.  Please wear clean clothes to the hospital/surgery center.    Please read over the following fact sheets that you were given. Pain Booklet, Coughing and Deep Breathing, MRSA Information and Surgical Site Infection Prevention

## 2015-04-13 NOTE — Progress Notes (Signed)
Medical Md is Dr.William Producer, television/film/video

## 2015-04-15 ENCOUNTER — Ambulatory Visit (HOSPITAL_BASED_OUTPATIENT_CLINIC_OR_DEPARTMENT_OTHER): Payer: Medicare Other

## 2015-04-15 ENCOUNTER — Telehealth: Payer: Self-pay | Admitting: Oncology

## 2015-04-15 ENCOUNTER — Ambulatory Visit (HOSPITAL_BASED_OUTPATIENT_CLINIC_OR_DEPARTMENT_OTHER): Payer: Medicare Other | Admitting: Nurse Practitioner

## 2015-04-15 ENCOUNTER — Encounter: Payer: Self-pay | Admitting: Nurse Practitioner

## 2015-04-15 VITALS — BP 153/86 | HR 72 | Temp 98.6°F | Resp 18 | Wt 285.1 lb

## 2015-04-15 DIAGNOSIS — D649 Anemia, unspecified: Secondary | ICD-10-CM

## 2015-04-15 DIAGNOSIS — I10 Essential (primary) hypertension: Secondary | ICD-10-CM

## 2015-04-15 DIAGNOSIS — D509 Iron deficiency anemia, unspecified: Secondary | ICD-10-CM

## 2015-04-15 DIAGNOSIS — C50412 Malignant neoplasm of upper-outer quadrant of left female breast: Secondary | ICD-10-CM | POA: Diagnosis not present

## 2015-04-15 DIAGNOSIS — Z79810 Long term (current) use of selective estrogen receptor modulators (SERMs): Secondary | ICD-10-CM | POA: Diagnosis not present

## 2015-04-15 DIAGNOSIS — Z170 Estrogen receptor positive status [ER+]: Secondary | ICD-10-CM | POA: Diagnosis not present

## 2015-04-15 DIAGNOSIS — E210 Primary hyperparathyroidism: Secondary | ICD-10-CM

## 2015-04-15 DIAGNOSIS — G4733 Obstructive sleep apnea (adult) (pediatric): Secondary | ICD-10-CM

## 2015-04-15 LAB — CBC WITH DIFFERENTIAL/PLATELET
BASO%: 0.3 % (ref 0.0–2.0)
BASOS ABS: 0 10*3/uL (ref 0.0–0.1)
EOS%: 1.5 % (ref 0.0–7.0)
Eosinophils Absolute: 0.1 10*3/uL (ref 0.0–0.5)
HEMATOCRIT: 33.4 % — AB (ref 34.8–46.6)
HGB: 10.7 g/dL — ABNORMAL LOW (ref 11.6–15.9)
LYMPH#: 1.7 10*3/uL (ref 0.9–3.3)
LYMPH%: 22.6 % (ref 14.0–49.7)
MCH: 24.9 pg — AB (ref 25.1–34.0)
MCHC: 32 g/dL (ref 31.5–36.0)
MCV: 77.9 fL — ABNORMAL LOW (ref 79.5–101.0)
MONO#: 0.8 10*3/uL (ref 0.1–0.9)
MONO%: 10 % (ref 0.0–14.0)
NEUT#: 5 10*3/uL (ref 1.5–6.5)
NEUT%: 65.6 % (ref 38.4–76.8)
Platelets: 241 10*3/uL (ref 145–400)
RBC: 4.29 10*6/uL (ref 3.70–5.45)
RDW: 17.2 % — ABNORMAL HIGH (ref 11.2–14.5)
WBC: 7.6 10*3/uL (ref 3.9–10.3)

## 2015-04-15 LAB — COMPREHENSIVE METABOLIC PANEL (CC13)
ALBUMIN: 3.5 g/dL (ref 3.5–5.0)
ALK PHOS: 69 U/L (ref 40–150)
ALT: 11 U/L (ref 0–55)
AST: 13 U/L (ref 5–34)
Anion Gap: 7 mEq/L (ref 3–11)
BUN: 12.4 mg/dL (ref 7.0–26.0)
CO2: 26 mEq/L (ref 22–29)
Calcium: 10.2 mg/dL (ref 8.4–10.4)
Chloride: 111 mEq/L — ABNORMAL HIGH (ref 98–109)
Creatinine: 1 mg/dL (ref 0.6–1.1)
EGFR: 62 mL/min/{1.73_m2} — AB (ref >90)
GLUCOSE: 100 mg/dL (ref 70–140)
POTASSIUM: 4.1 meq/L (ref 3.5–5.1)
SODIUM: 144 meq/L (ref 136–145)
Total Bilirubin: 0.22 mg/dL (ref 0.20–1.20)
Total Protein: 7.1 g/dL (ref 6.4–8.3)

## 2015-04-15 LAB — FERRITIN CHCC: Ferritin: 362 ng/ml — ABNORMAL HIGH (ref 9–269)

## 2015-04-15 NOTE — Progress Notes (Signed)
ID: [name]   DOB: 02/25/1946  MR#: [id number]  NTI#:[id number]  PCP: Shirline Frees, MD GYN: Gus Height, MD SU: Coralie Keens, MD OTHER MD: Gery Pray, MD; Everlean Cherry  CHIEF COMPLAINT:  Hx of Left Breast Cancer  CURRENT TREATMENT: Tamoxifen  BREAST CANCER HISTORY: From the original intake note:  [name] had routine screening mammography December 2012 showing a possible mass in the left breast. This was confirmed with left diagnostic mammography 07/13/2011, with an ultrasound that day showing a hypoechoic mass measuring 6 mm in the upper outer quadrant of the left breast. Biopsy of this mass 08/02/2011 showed (SAA 13-19) and invasive ductal carcinoma, which was estrogen receptor 100% positive, progesterone receptor 100% positive, with an MIB-1 of 6%, and no HER-2 amplification.  Breast MRI 08/08/2011 showed a solitary 1.2 cm mass in the upper outer quadrant of the left breast, and the patient underwent definitive left lumpectomy 08/30/2011 for a 1.2 cm invasive ductal carcinoma, grade 1, with all 3 sentinel lymph nodes clear. That he was evaluated by Dr. Eston Esters, and after her radiation treatments was completed he tried her on a rim index initially, then letrozole. The patient was unable to tolerate those medications.   The patient's subsequent history is as detailed below  INTERVAL HISTORY: [name] returns today for follow-up of her breast cancer. She has been on tamoxifen since March 2014 and is tolerating this well with no side effects that she is aware of. She denies hot flashes or vaginal changes. The interval history is remarkable for back surgery where screws were placed in her lumbar spine. One of the pins is "crooked" and now she needs a revision surgery. She has percocet to use PRN for her back pain.   REVIEW OF SYSTEMS: [name] denies fevers, chills ,nausea or vomiting. She has some constipation with her pain meds. She is eating well. She has mild ankle swelling and  numbness to her toes, which she says is a result of her back issues. She has shortness of breath with exertion, but denies chest pain, cough, or palpitations. She is very fatigued by the end of her day and this concerns her. She has no headaches, dizziness, or vision changes. She endorses anxiety and depressions. A detailed review of systems is otherwise stable.  PAST MEDICAL HISTORY: Past Medical History  Diagnosis Date  . OSA (obstructive sleep apnea)   . Hyperlipidemia     takes Niacin daily  . Headache(784.0)   . Blood transfusion   . Anemia   . Diverticulosis   . Radiation 10/29/11-11/24/11    left breast 6100 cGy  . Tinnitus   . History of colon polyps 1998    adenomatous  . Spinal stenosis   . Bronchitis     hx of  . Shortness of breath     with exertion  . Arthritis   . Breast cancer 2013    left breast  . Pneumonia 4/12    Albuterol daily as needed  . Anxiety     takes Xanax daily  . Hypertension     takes Verapamil and Avapro daily  . Depression     takes Lexapro daily    PAST SURGICAL HISTORY: Past Surgical History  Procedure Laterality Date  . Total knee arthroplasty  2006    bilateral  . Polypectomy  1990's  . Appendectomy  1978  . Thyroid cyst excision  1994  . Transphenoidal / transnasal hypophysectomy / resection pituitary tumor  6/11  . Belpharoptosis repair    .  Breast lumpectomy  08/2011    left  . Joint replacement Bilateral     bilateral knee  . Colonoscopy w/ polypectomy    . Breast lumpectomy with needle localization Right 02/06/2013    Procedure: RIGHT BREAST LUMPECTOMY WITH NEEDLE LOCALIZATION;  Surgeon: Harl Bowie, MD;  Location: [facility];  Service: General;  Laterality: Right;    FAMILY HISTORY Family History  Problem Relation Age of Onset  . Heart disease Father   . Clotting disorder Father   . Stroke Father   . Stroke Mother   . Diabetes Mother   . Colon cancer Neg Hx   . Cancer Sister 15    Breast Cancer  The patient's  father died at the age of 48 from a ruptured aneurysm (the patient is not sure if this was cerebral or thoracic). The patient's mother died at the age of 66 from a stroke. The patient had one brother and one sister. Her sister has recently been diagnosed with breast cancer at the age of 8. There is no other history of breast or ovarian cancer in the family.  GYNECOLOGIC HISTORY:  (reviewed 12/11/2013) Menarche age 74, first live birth age 47, the patient is GX P2, change of life at age 48. She never took hormone replacement.  SOCIAL HISTORY:  (updated 12/11/2013) She is retired from a clerical work Designer, jewellery. She has been married 89 years. Her husband, Shanon Brow, owned his own [employer] and is retired. Daughter Coralyn Mark works for [employer] here in [place]. Son Jenetta Loges works as a Designer, industrial/product in [employer]. The patient has 3 grandchildren and 1 great granddaughter. She attends a local [employer]: Not on file  HEALTH MAINTENANCE:  (updated 12/11/2013) Social History  Substance Use Topics  . Smoking status: Never Smoker   . Smokeless tobacco: Never Used  . Alcohol Use: No     Colonoscopy: September 2006, normal (to be repeated in 2016) /[facility]   PAP:  January 2015/[facility]  Bone density: Her last bone density scan on 09/28/2011 showed a T score of 2.0 (normal).  Lipid panel: Per Dr. Kenton Kingfisher  Allergies  Allergen Reactions  . Cymbalta [Duloxetine Hcl] Other (See Comments)    Stomach pain  . Lipitor [Atorvastatin] Other (See Comments)    Cramps  . Maxzide [Hydrochlorothiazide W-Triamterene] Other (See Comments)    Cramping   . Pravastatin Sodium Other (See Comments)    Headache   . Simvastatin Other (See Comments)    Memory loss  . Lodine [Etodolac] Itching    Current Outpatient Prescriptions  Medication Sig Dispense Refill  . escitalopram (LEXAPRO) 10 MG tablet Take 10 mg by mouth daily.    . furosemide (LASIX) 20 MG tablet Take 20 mg by mouth every other day.      . irbesartan (AVAPRO) 300 MG tablet Take 300 mg by mouth at bedtime.   0  . niacin 500 MG CR capsule Take 500 mg by mouth 2 (two) times daily with a meal.    . Omega-3 Fatty Acids (FISH OIL) 1200 MG CAPS Take 1,200 mg by mouth 2 (two) times daily.    [name] oxyCODONE-acetaminophen (PERCOCET/ROXICET) 5-325 MG per tablet Take 1-2 tablets by mouth every 4 (four) hours as needed for moderate pain. (Patient taking differently: Take 1 tablet by mouth 2 (two) times daily. scheduled) 65 tablet 0  . tamoxifen (NOLVADEX) 20 MG tablet Take 1 tablet by mouth  daily 90 tablet 3  . Tapentadol HCl (NUCYNTA) 100  MG TABS Take 100 mg by mouth every 12 (twelve) hours.    . verapamil (VERELAN PM) 240 MG 24 hr capsule Take 240 mg by mouth daily.     . Vitamin D, Cholecalciferol, 400 UNITS TABS Take 400 Units by mouth at bedtime.    [name] albuterol (PROVENTIL HFA;VENTOLIN HFA) 108 (90 BASE) MCG/ACT inhaler Inhale 2 puffs into the lungs every 6 (six) hours as needed for wheezing.    [name] ALPRAZolam (XANAX) 0.25 MG tablet Take 0.25 mg by mouth See admin instructions. Take 1 tablet (0.25 mg) by mouth prior to procedure (MRI)  0  . clopidogrel (PLAVIX) 75 MG tablet Take 75 mg by mouth daily.     No current facility-administered medications for this visit.   Facility-Administered Medications Ordered in Other Visits  Medication Dose Route Frequency Provider Last Rate Last Dose  . silver sulfADIAZINE (SILVADENE) 1 % cream   Topical BID Gery Pray, MD   1 application at 97/35/32 [phone]    OBJECTIVE: Middle-aged African American woman in no acute distress   Filed Vitals:   04/15/15 1017  BP: 153/86  Pulse: 72  Temp: 98.6 F (37 C)  Resp: 18    Body mass index is 46.04 kg/(m^2).    ECOG FS: 2 Filed Weights   04/15/15 1017  Weight: 285 lb 1.6 oz (129.321 kg)   Skin: warm, dry  HEENT: sclerae anicteric, conjunctivae pink, oropharynx clear. No thrush or mucositis.  Lymph Nodes: No cervical or supraclavicular  lymphadenopathy  Lungs: clear to auscultation bilaterally, no rales, wheezes, or rhonci  Heart: regular rate and rhythm  Abdomen: round, soft, non tender, positive bowel sounds  Musculoskeletal: No focal spinal tenderness, no peripheral edema  Neuro: non focal, well oriented, positive affect  Breasts: left breast status post lumpectomy and radiation. No evidence of recurrent disease. Left axilla benign. Right breast unremarkable.  LAB RESULTS:   Lab Results  Component Value Date   WBC 7.6 04/15/2015   NEUTROABS 5.0 04/15/2015   HGB 10.7* 04/15/2015   HCT 33.4* 04/15/2015   MCV 77.9* 04/15/2015   PLT 241 04/15/2015      Chemistry      Component Value Date/Time   NA 142 04/08/2015 0803   NA 140 12/23/2014 1457   K 4.0 04/08/2015 0803   K 3.7 12/23/2014 1457   CL 104 12/23/2014 1457   CL 105 12/09/2012 1423   CO2 23 04/08/2015 0803   CO2 24 12/23/2014 1457   BUN 14.6 04/08/2015 0803   BUN 11 12/23/2014 1457   CREATININE 1.2* 04/08/2015 0803   CREATININE 1.02* 12/23/2014 1457      Component Value Date/Time   CALCIUM 10.2 04/08/2015 0803   CALCIUM 10.2 12/23/2014 1457   CALCIUM 9.9 12/09/2012 1423   ALKPHOS 71 04/08/2015 0803   ALKPHOS 63 01/17/2012 1431   AST 11 04/08/2015 0803   AST 11 01/17/2012 1431   ALT 12 04/08/2015 0803   ALT 19 01/17/2012 1431   BILITOT 0.53 04/08/2015 0803   BILITOT 0.3 01/17/2012 1431       STUDIES: Ct Lumbar Spine Wo Contrast  03/26/2015   CLINICAL DATA:  Low back pain with numbness in the toes and leg weakness. Left hip pain. History of breast cancer. Status post L3-4 and L4-5 decompressive laminectomy and lumbar fusion.  EXAM: CT LUMBAR SPINE WITHOUT CONTRAST  TECHNIQUE: Multidetector CT imaging of the lumbar spine was performed without intravenous contrast administration. Multiplanar CT image reconstructions were also generated.  COMPARISON:  Plain films lumbar spine 03/03/2015. Postmyelogram CT scan 03/22/2006 and MRI lumbar spine  performed at an outside facility 01/26/2011.  FINDINGS: As noted on the prior CT scan, the patient has transitional anatomy at the lumbosacral junction. There appear to be only 4 true lumbar vertebral bodies with S1 a transitional segment. Numbering scheme is the same as that used on the prior CT scan. Report of the prior MRI is not available to assess numbering scheme used on the exam.  There is a Schmorl's node in the superior endplate of W09. No fracture is identified. Based on numbering scheme above, the patient is status post L3-S1 fusion as detailed below. There is 0.2 cm anterolisthesis L4 on S1 compared to 0.4 cm on the prior MRI. Vertebral body alignment is otherwise normal. Imaged intra-abdominal contents demonstrate a punctate nonobstructing stone in the right kidney.  T11-12: There is some facet degenerative change. This level is otherwise negative.  T12-L1: Shallow broad-based disc bulge without central canal or foraminal stenosis is identified.  L1-2: Moderate bilateral facet degenerative change is seen. No disc bulge or protrusion. The central canal and foramina are open.  L2-3: There is a shallow disc bulge with ligamentum flavum thickening causing mild to moderate central canal narrowing. Facet degenerative disease is identified. The foramina appear open.  L3-4: Status post laminectomy and fusion. Pedicle screws and interbody spacer are well-positioned. The appears to be early osseous fusion across the disc interspace. The central canal and foramina are widely patent. Decompression of the foramina is noted.  L4-S1: The right S1 screw is well positioned. The left S1 screw traverses the lateral recess. The medial margin of the screw is not covered by bone. Interbody spacer appears appropriately positioned. There is early osseous fusion across the posterior elements on the left. There appears to be some early incorporation of the interbody spacer into the endplates. The central canal and foramina are  open.  S1-2:  Negative.  IMPRESSION: Transitional anatomy at the lumbosacral junction with only 4 true lumbar vertebra identified. Numbering scheme is the same as that used on the prior CT myelogram.  Status post L3-S1 fusion. The central canal and foramina are widely patent at both levels and there is early incorporation of interbody spacers at each level and some fusion of the posterior elements on the left at L4-S1. Note is again made that the left pedicle screw in S1 traverses the lateral recess and is not covered by bone along its medial margin.   Electronically Signed   By: Inge Rise M.D.   On: 03/26/2015 14:53     ASSESSMENT: 69 y.o. [place] woman   (1)  status post left lumpectomy and sentinel lymph node sampling 08/30/2011 for a pT1c pN0, stage IA invasive ductal carcinoma, grade 1, estrogen and progesterone receptor positive, both at 100%, with an MIB-1 of 6%, and no HER-2 amplification.  (2) Oncotype DX score of 3 predicted a distant recurrence within 10 years of 3% if the patient's only adjuvant treatment was tamoxifen for 5 years  (3) Adjuvant! Online would quote her a risk of recurrence of 16% (including in breast recurrences) dropping to 8% with 5 years of an aromatase inhibitor  (4) Completed adjuvant radiation treatments 11/24/2011  (5)  The patient was unable to take letrozole or anastrozole secondary to concerns regarding jaw pain and constipation.  (5)  Tamoxifen started 10/11/2012   (6) Persistent moderate elevation of the blood calcium level; with elevated  PTH (insurance refused to approve parathyroidectomy).   (  7) benign right breast lumpectomy 02/06/2013 for suspicious calcifications  PLAN:  [name] is doing well as far as her breast cancer is concerned. She is now 3.5 years out from her definitive surgery with no evidence of recurrent disease.  She is tolerating the tamoxifen well and will continue this drug for at least 5, but possibly 10 years of  antiestrogen therapy.   The labs were reviewed in detail and showed continue microcytic anemia. She is symptomatic, citing extreme fatigue by the end of the day. A ferritin level was added on to testing today to check for iron deficiency as the reason for her anemia. We will call her with the results and recommendation, which will likely be oral iron therapy.   [name] will be due for a repeat mammogram this December. She will return to this office in 1 year for labs and a follow up visit. She understands and agrees with this plan. She knows the goal of treatment in her case is cure. She has been encouraged to call with any issues that might arise before her next visit here.  Laurie Panda, NP 04/15/2015  11:32 AM

## 2015-04-15 NOTE — Telephone Encounter (Signed)
Gave avs & calendar for September 2017 °

## 2015-04-19 MED ORDER — DEXTROSE 5 % IV SOLN
3.0000 g | INTRAVENOUS | Status: AC
  Administered 2015-04-20: 3 g via INTRAVENOUS
  Filled 2015-04-19: qty 3000

## 2015-04-19 MED ORDER — DEXAMETHASONE SODIUM PHOSPHATE 10 MG/ML IJ SOLN
10.0000 mg | INTRAMUSCULAR | Status: AC
  Administered 2015-04-20: 10 mg via INTRAVENOUS
  Filled 2015-04-19: qty 1

## 2015-04-20 ENCOUNTER — Encounter (HOSPITAL_COMMUNITY): Payer: Self-pay | Admitting: Anesthesiology

## 2015-04-20 ENCOUNTER — Inpatient Hospital Stay (HOSPITAL_COMMUNITY): Payer: Medicare Other | Admitting: Certified Registered Nurse Anesthetist

## 2015-04-20 ENCOUNTER — Encounter (HOSPITAL_COMMUNITY): Payer: Self-pay | Admitting: *Deleted

## 2015-04-20 ENCOUNTER — Inpatient Hospital Stay (HOSPITAL_COMMUNITY)
Admission: RE | Admit: 2015-04-20 | Discharge: 2015-04-21 | DRG: 497 | Disposition: A | Discharge status: Home / Self Care | Payer: Medicare Other | Source: Ambulatory Visit | Attending: Neurosurgery | Admitting: Neurosurgery

## 2015-04-20 ENCOUNTER — Encounter (HOSPITAL_COMMUNITY)
Admission: RE | Disposition: A | Discharge status: Home / Self Care | Payer: Self-pay | Source: Ambulatory Visit | Attending: Neurosurgery

## 2015-04-20 ENCOUNTER — Inpatient Hospital Stay (HOSPITAL_COMMUNITY): Payer: Medicare Other

## 2015-04-20 DIAGNOSIS — G4733 Obstructive sleep apnea (adult) (pediatric): Secondary | ICD-10-CM | POA: Diagnosis present

## 2015-04-20 DIAGNOSIS — F329 Major depressive disorder, single episode, unspecified: Secondary | ICD-10-CM | POA: Diagnosis present

## 2015-04-20 DIAGNOSIS — Z853 Personal history of malignant neoplasm of breast: Secondary | ICD-10-CM | POA: Diagnosis not present

## 2015-04-20 DIAGNOSIS — I10 Essential (primary) hypertension: Secondary | ICD-10-CM | POA: Diagnosis present

## 2015-04-20 DIAGNOSIS — Z923 Personal history of irradiation: Secondary | ICD-10-CM

## 2015-04-20 DIAGNOSIS — Z96653 Presence of artificial knee joint, bilateral: Secondary | ICD-10-CM | POA: Diagnosis present

## 2015-04-20 DIAGNOSIS — Z79899 Other long term (current) drug therapy: Secondary | ICD-10-CM | POA: Diagnosis not present

## 2015-04-20 DIAGNOSIS — Z7902 Long term (current) use of antithrombotics/antiplatelets: Secondary | ICD-10-CM | POA: Diagnosis not present

## 2015-04-20 DIAGNOSIS — M549 Dorsalgia, unspecified: Secondary | ICD-10-CM | POA: Diagnosis present

## 2015-04-20 DIAGNOSIS — Y831 Surgical operation with implant of artificial internal device as the cause of abnormal reaction of the patient, or of later complication, without mention of misadventure at the time of the procedure: Secondary | ICD-10-CM | POA: Diagnosis present

## 2015-04-20 DIAGNOSIS — T84226A Displacement of internal fixation device of vertebrae, initial encounter: Principal | ICD-10-CM | POA: Diagnosis present

## 2015-04-20 DIAGNOSIS — M5416 Radiculopathy, lumbar region: Secondary | ICD-10-CM | POA: Diagnosis present

## 2015-04-20 DIAGNOSIS — F419 Anxiety disorder, unspecified: Secondary | ICD-10-CM | POA: Diagnosis present

## 2015-04-20 DIAGNOSIS — E785 Hyperlipidemia, unspecified: Secondary | ICD-10-CM | POA: Diagnosis present

## 2015-04-20 DIAGNOSIS — Z419 Encounter for procedure for purposes other than remedying health state, unspecified: Secondary | ICD-10-CM

## 2015-04-20 HISTORY — PX: HARDWARE REMOVAL: SHX979

## 2015-04-20 SURGERY — REMOVAL, HARDWARE
Anesthesia: General | Site: Back | Laterality: Left

## 2015-04-20 SURGERY — REMOVAL, HARDWARE
Anesthesia: General | Site: Spine Lumbar | Laterality: Left

## 2015-04-20 MED ORDER — ALBUTEROL SULFATE (2.5 MG/3ML) 0.083% IN NEBU: 3.0000 mL | INHALATION_SOLUTION | Freq: Four times a day (QID) | RESPIRATORY_TRACT | Status: DC | PRN

## 2015-04-20 MED ORDER — DOCUSATE SODIUM 100 MG PO CAPS
100.0000 mg | ORAL_CAPSULE | Freq: Two times a day (BID) | ORAL | Status: DC
  Administered 2015-04-20 (×2): 100 mg via ORAL
  Filled 2015-04-20 (×2): qty 1

## 2015-04-20 MED ORDER — SODIUM CHLORIDE 0.9 % IR SOLN
Status: DC | PRN
  Administered 2015-04-20: 10:00:00

## 2015-04-20 MED ORDER — ONDANSETRON HCL 4 MG/2ML IJ SOLN
INTRAMUSCULAR | Status: DC | PRN
  Administered 2015-04-20: 4 mg via INTRAVENOUS

## 2015-04-20 MED ORDER — CEFAZOLIN SODIUM-DEXTROSE 2-3 GM-% IV SOLR
2.0000 g | Freq: Three times a day (TID) | INTRAVENOUS | Status: AC
  Administered 2015-04-20 – 2015-04-21 (×2): 2 g via INTRAVENOUS
  Filled 2015-04-20 (×2): qty 50

## 2015-04-20 MED ORDER — THROMBIN 5000 UNITS EX SOLR
CUTANEOUS | Status: DC | PRN
  Administered 2015-04-20 (×2): 5000 [IU] via TOPICAL

## 2015-04-20 MED ORDER — SODIUM CHLORIDE 0.9 % IJ SOLN
3.0000 mL | Freq: Two times a day (BID) | INTRAMUSCULAR | Status: DC
  Administered 2015-04-20 (×2): 3 mL via INTRAVENOUS

## 2015-04-20 MED ORDER — ACETAMINOPHEN 650 MG RE SUPP: 650.0000 mg | RECTAL | Status: DC | PRN

## 2015-04-20 MED ORDER — PHENOL 1.4 % MT LIQD: 1.0000 | OROMUCOSAL | Status: DC | PRN

## 2015-04-20 MED ORDER — FENTANYL CITRATE (PF) 250 MCG/5ML IJ SOLN
INTRAMUSCULAR | Status: AC
  Filled 2015-04-20: qty 5

## 2015-04-20 MED ORDER — KCL IN DEXTROSE-NACL 20-5-0.45 MEQ/L-%-% IV SOLN
80.0000 mL/h | INTRAVENOUS | Status: DC
  Filled 2015-04-20 (×3): qty 1000

## 2015-04-20 MED ORDER — MENTHOL 3 MG MT LOZG
1.0000 | LOZENGE | OROMUCOSAL | Status: DC | PRN
  Filled 2015-04-20: qty 9

## 2015-04-20 MED ORDER — PROPOFOL 10 MG/ML IV BOLUS
INTRAVENOUS | Status: AC
  Filled 2015-04-20: qty 20

## 2015-04-20 MED ORDER — SODIUM CHLORIDE 0.9 % IJ SOLN: 3.0000 mL | INTRAMUSCULAR | Status: DC | PRN

## 2015-04-20 MED ORDER — ONDANSETRON HCL 4 MG/2ML IJ SOLN: 4.0000 mg | INTRAMUSCULAR | Status: DC | PRN

## 2015-04-20 MED ORDER — NEOSTIGMINE METHYLSULFATE 10 MG/10ML IV SOLN
INTRAVENOUS | Status: DC | PRN
Start: 2015-04-20 — End: 2015-04-20
  Administered 2015-04-20: 5 mg via INTRAVENOUS

## 2015-04-20 MED ORDER — 0.9 % SODIUM CHLORIDE (POUR BTL) OPTIME
TOPICAL | Status: DC | PRN
  Administered 2015-04-20: 1000 mL

## 2015-04-20 MED ORDER — ONDANSETRON HCL 4 MG/2ML IJ SOLN
INTRAMUSCULAR | Status: AC
  Filled 2015-04-20: qty 2

## 2015-04-20 MED ORDER — PHENYLEPHRINE HCL 10 MG/ML IJ SOLN
10.0000 mg | INTRAMUSCULAR | Status: DC | PRN
  Administered 2015-04-20: 20 ug/min via INTRAVENOUS

## 2015-04-20 MED ORDER — ARTIFICIAL TEARS OP OINT
TOPICAL_OINTMENT | OPHTHALMIC | Status: AC
  Filled 2015-04-20: qty 3.5

## 2015-04-20 MED ORDER — PANTOPRAZOLE SODIUM 40 MG IV SOLR: 40.0000 mg | Freq: Every day | INTRAVENOUS | Status: DC

## 2015-04-20 MED ORDER — MIDAZOLAM HCL 5 MG/5ML IJ SOLN
INTRAMUSCULAR | Status: DC | PRN
  Administered 2015-04-20: 2 mg via INTRAVENOUS

## 2015-04-20 MED ORDER — ESCITALOPRAM OXALATE 10 MG PO TABS
10.0000 mg | ORAL_TABLET | Freq: Every day | ORAL | Status: DC
  Administered 2015-04-20: 10 mg via ORAL
  Filled 2015-04-20 (×2): qty 1

## 2015-04-20 MED ORDER — VERAPAMIL HCL ER 240 MG PO CP24: 240.0000 mg | ORAL_CAPSULE | Freq: Every day | ORAL | Status: DC

## 2015-04-20 MED ORDER — ROCURONIUM BROMIDE 100 MG/10ML IV SOLN
INTRAVENOUS | Status: DC | PRN
  Administered 2015-04-20: 10 mg via INTRAVENOUS
  Administered 2015-04-20: 40 mg via INTRAVENOUS

## 2015-04-20 MED ORDER — BUPIVACAINE HCL (PF) 0.5 % IJ SOLN
INTRAMUSCULAR | Status: DC | PRN
  Administered 2015-04-20: 10 mL
  Administered 2015-04-20: 8 mL

## 2015-04-20 MED ORDER — ACETAMINOPHEN 325 MG PO TABS: 650.0000 mg | ORAL_TABLET | ORAL | Status: DC | PRN

## 2015-04-20 MED ORDER — ALPRAZOLAM 0.25 MG PO TABS: 0.2500 mg | ORAL_TABLET | ORAL | Status: DC

## 2015-04-20 MED ORDER — IRBESARTAN 300 MG PO TABS
300.0000 mg | ORAL_TABLET | Freq: Every day | ORAL | Status: DC
  Administered 2015-04-20: 300 mg via ORAL
  Filled 2015-04-20 (×2): qty 1

## 2015-04-20 MED ORDER — FUROSEMIDE 20 MG PO TABS
20.0000 mg | ORAL_TABLET | ORAL | Status: DC
  Administered 2015-04-20: 20 mg via ORAL
  Filled 2015-04-20: qty 1

## 2015-04-20 MED ORDER — HYDROMORPHONE HCL 1 MG/ML IJ SOLN: 1.0000 mg | INTRAMUSCULAR | Status: DC | PRN

## 2015-04-20 MED ORDER — MIDAZOLAM HCL 2 MG/2ML IJ SOLN
INTRAMUSCULAR | Status: AC
  Filled 2015-04-20: qty 4

## 2015-04-20 MED ORDER — LACTATED RINGERS IV SOLN
INTRAVENOUS | Status: DC | PRN
  Administered 2015-04-20 (×2): via INTRAVENOUS

## 2015-04-20 MED ORDER — FENTANYL CITRATE (PF) 100 MCG/2ML IJ SOLN
INTRAMUSCULAR | Status: DC | PRN
  Administered 2015-04-20: 100 ug via INTRAVENOUS
  Administered 2015-04-20: 50 ug via INTRAVENOUS

## 2015-04-20 MED ORDER — PANTOPRAZOLE SODIUM 40 MG PO TBEC
40.0000 mg | DELAYED_RELEASE_TABLET | Freq: Every day | ORAL | Status: DC
  Administered 2015-04-20: 40 mg via ORAL
  Filled 2015-04-20: qty 1

## 2015-04-20 MED ORDER — VERAPAMIL HCL ER 240 MG PO TBCR
240.0000 mg | EXTENDED_RELEASE_TABLET | Freq: Every day | ORAL | Status: DC
  Filled 2015-04-20: qty 1

## 2015-04-20 MED ORDER — GLYCOPYRROLATE 0.2 MG/ML IJ SOLN
INTRAMUSCULAR | Status: DC | PRN
  Administered 2015-04-20: 0.1 mg via INTRAVENOUS
  Administered 2015-04-20: .8 mg via INTRAVENOUS
  Administered 2015-04-20: 0.1 mg via INTRAVENOUS

## 2015-04-20 MED ORDER — CYCLOBENZAPRINE HCL 10 MG PO TABS
10.0000 mg | ORAL_TABLET | Freq: Three times a day (TID) | ORAL | Status: DC | PRN
  Administered 2015-04-20: 10 mg via ORAL
  Filled 2015-04-20: qty 1

## 2015-04-20 MED ORDER — HEMOSTATIC AGENTS (NO CHARGE) OPTIME
TOPICAL | Status: DC | PRN
  Administered 2015-04-20: 1 via TOPICAL

## 2015-04-20 MED ORDER — PROPOFOL 10 MG/ML IV BOLUS
INTRAVENOUS | Status: DC | PRN
  Administered 2015-04-20: 200 mg via INTRAVENOUS

## 2015-04-20 MED ORDER — LIDOCAINE HCL (CARDIAC) 20 MG/ML IV SOLN
INTRAVENOUS | Status: DC | PRN
  Administered 2015-04-20: 50 mg via INTRAVENOUS

## 2015-04-20 MED ORDER — OXYCODONE-ACETAMINOPHEN 5-325 MG PO TABS
1.0000 | ORAL_TABLET | ORAL | Status: DC | PRN
  Administered 2015-04-20 – 2015-04-21 (×3): 2 via ORAL
  Filled 2015-04-20 (×3): qty 2

## 2015-04-20 MED ORDER — SUCCINYLCHOLINE CHLORIDE 20 MG/ML IJ SOLN
INTRAMUSCULAR | Status: DC | PRN
  Administered 2015-04-20: 100 mg via INTRAVENOUS

## 2015-04-20 SURGICAL SUPPLY — 50 items
ADH SKN CLS APL DERMABOND .7 (GAUZE/BANDAGES/DRESSINGS) ×1
APL SKNCLS STERI-STRIP NONHPOA (GAUZE/BANDAGES/DRESSINGS) ×1
BAG DECANTER FOR FLEXI CONT (MISCELLANEOUS) ×3 IMPLANT
BENZOIN TINCTURE PRP APPL 2/3 (GAUZE/BANDAGES/DRESSINGS) ×3 IMPLANT
BLADE 10 SAFETY STRL DISP (BLADE) ×3 IMPLANT
BLADE SURG ROTATE 9660 (MISCELLANEOUS) IMPLANT
BRUSH SCRUB EZ PLAIN DRY (MISCELLANEOUS) ×3 IMPLANT
CANISTER SUCT 3000ML PPV (MISCELLANEOUS) ×3 IMPLANT
CLEANER TIP ELECTROSURG 2X2 (MISCELLANEOUS) ×1 IMPLANT
CLOSURE WOUND 1/2 X4 (GAUZE/BANDAGES/DRESSINGS) ×1
CONT SPEC 4OZ CLIKSEAL STRL BL (MISCELLANEOUS) IMPLANT
COVER SURGICAL LIGHT HANDLE (MISCELLANEOUS) ×2 IMPLANT
DERMABOND ADVANCED (GAUZE/BANDAGES/DRESSINGS) ×2
DERMABOND ADVANCED .7 DNX12 (GAUZE/BANDAGES/DRESSINGS) IMPLANT
DRAPE LAPAROTOMY 100X72X124 (DRAPES) ×3 IMPLANT
DRSG TELFA 3X8 NADH (GAUZE/BANDAGES/DRESSINGS) IMPLANT
ELECT REM PT RETURN 9FT ADLT (ELECTROSURGICAL) ×3
ELECTRODE REM PT RTRN 9FT ADLT (ELECTROSURGICAL) ×1 IMPLANT
GAUZE SPONGE 4X4 12PLY STRL (GAUZE/BANDAGES/DRESSINGS) ×3 IMPLANT
GAUZE SPONGE 4X4 16PLY XRAY LF (GAUZE/BANDAGES/DRESSINGS) IMPLANT
GLOVE BIOGEL PI IND STRL 7.0 (GLOVE) IMPLANT
GLOVE BIOGEL PI IND STRL 7.5 (GLOVE) IMPLANT
GLOVE BIOGEL PI INDICATOR 7.0 (GLOVE) ×4
GLOVE BIOGEL PI INDICATOR 7.5 (GLOVE) ×6
GLOVE ECLIPSE 8.0 STRL XLNG CF (GLOVE) ×3 IMPLANT
GLOVE EXAM NITRILE LRG STRL (GLOVE) IMPLANT
GLOVE EXAM NITRILE XL STR (GLOVE) IMPLANT
GLOVE EXAM NITRILE XS STR PU (GLOVE) IMPLANT
GOWN STRL REUS W/ TWL LRG LVL3 (GOWN DISPOSABLE) IMPLANT
GOWN STRL REUS W/ TWL XL LVL3 (GOWN DISPOSABLE) IMPLANT
GOWN STRL REUS W/TWL 2XL LVL3 (GOWN DISPOSABLE) ×5 IMPLANT
GOWN STRL REUS W/TWL LRG LVL3 (GOWN DISPOSABLE) ×6
GOWN STRL REUS W/TWL XL LVL3 (GOWN DISPOSABLE)
KIT BASIN OR (CUSTOM PROCEDURE TRAY) ×3 IMPLANT
KIT ROOM TURNOVER OR (KITS) ×3 IMPLANT
NS IRRIG 1000ML POUR BTL (IV SOLUTION) ×3 IMPLANT
PACK LAMINECTOMY NEURO (CUSTOM PROCEDURE TRAY) ×3 IMPLANT
PAD ARMBOARD 7.5X6 YLW CONV (MISCELLANEOUS) ×9 IMPLANT
PAD DRESSING TELFA 3X8 NADH (GAUZE/BANDAGES/DRESSINGS) ×1 IMPLANT
SPONGE SURGIFOAM ABS GEL SZ50 (HEMOSTASIS) ×3 IMPLANT
STAPLER SKIN PROX WIDE 3.9 (STAPLE) ×3 IMPLANT
STRIP CLOSURE SKIN 1/2X4 (GAUZE/BANDAGES/DRESSINGS) ×1 IMPLANT
SUT VIC AB 2-0 OS6 18 (SUTURE) ×9 IMPLANT
SUT VIC AB 3-0 CP2 18 (SUTURE) ×3 IMPLANT
SWAB CULTURE LIQ STUART DBL (MISCELLANEOUS) ×1 IMPLANT
SYR 20ML ECCENTRIC (SYRINGE) ×3 IMPLANT
TOWEL OR 17X24 6PK STRL BLUE (TOWEL DISPOSABLE) ×3 IMPLANT
TOWEL OR 17X26 10 PK STRL BLUE (TOWEL DISPOSABLE) ×3 IMPLANT
TUBE ANAEROBIC SPECIMEN COL (MISCELLANEOUS) ×1 IMPLANT
WATER STERILE IRR 1000ML POUR (IV SOLUTION) ×3 IMPLANT

## 2015-04-20 NOTE — H&P (Signed)
[name] is an 69 y.o. female.   Chief Complaint: Left back and leg pain HPI: The patient is a 69 year old female who had a two-level fusion about 3 months ago. She initially did well but has some persistent left lower extremity pain. Different conservative treatments were tried for a few months without improvement. She eventually underwent imaging studies which showed that the lowest screw on the left side had displaced medially and could possibly be irritating the nerve on that side. It was felt that the easiest course was to do a minimally invasive approach and removed that screw without replacements since she was already 3 months out from surgery and had good screws on the opposite side. The plan was discussed and she understood the plan and now comes for removal of the left-sided screw. I had a discussion with her regarding the risks and benefits of surgery which include but are not limited to bleeding and infection weakness and numbness spinal fluid leakage instability, and death. We have discussed alternative methods of therapy offered risks and benefits of nonintervention. She's had the opportunity numerous questions and appears to understand. With this information in hand she has requested that we proceed with surgery.  Past Medical History  Diagnosis Date  . OSA (obstructive sleep apnea)   . Hyperlipidemia     takes Niacin daily  . Headache(784.0)   . Blood transfusion   . Anemia   . Diverticulosis   . Radiation 10/29/11-11/24/11    left breast 6100 cGy  . Tinnitus   . History of colon polyps 1998    adenomatous  . Spinal stenosis   . Bronchitis     hx of  . Shortness of breath     with exertion  . Arthritis   . Breast cancer 2013    left breast  . Pneumonia 4/12    Albuterol daily as needed  . Anxiety     takes Xanax daily  . Hypertension     takes Verapamil and Avapro daily  . Depression     takes Lexapro daily    Past Surgical History  Procedure Laterality  Date  . Total knee arthroplasty  2006    bilateral  . Polypectomy  1990's  . Appendectomy  1978  . Thyroid cyst excision  1994  . Transphenoidal / transnasal hypophysectomy / resection pituitary tumor  6/11  . Belpharoptosis repair    . Breast lumpectomy  08/2011    left  . Joint replacement Bilateral     bilateral knee  . Colonoscopy w/ polypectomy    . Breast lumpectomy with needle localization Right 02/06/2013    Procedure: RIGHT BREAST LUMPECTOMY WITH NEEDLE LOCALIZATION;  Surgeon: Harl Bowie, MD;  Location: [facility];  Service: General;  Laterality: Right;    Family History  Problem Relation Age of Onset  . Heart disease Father   . Clotting disorder Father   . Stroke Father   . Stroke Mother   . Diabetes Mother   . Colon cancer Neg Hx   . Cancer Sister 15    Breast Cancer   Social History:  reports that she has never smoked. She has never used smokeless tobacco. She reports that she does not drink alcohol or use illicit drugs.  Allergies:  Allergies  Allergen Reactions  . Cymbalta [Duloxetine Hcl] Other (See Comments)    Stomach pain  . Lipitor [Atorvastatin] Other (See Comments)    Cramps  . Maxzide [Hydrochlorothiazide W-Triamterene] Other (See Comments)  Cramping   . Pravastatin Sodium Other (See Comments)    Headache   . Simvastatin Other (See Comments)    Memory loss  . Lodine [Etodolac] Itching    Medications Prior to Admission  Medication Sig Dispense Refill  . clopidogrel (PLAVIX) 75 MG tablet Take 75 mg by mouth daily.    [name] escitalopram (LEXAPRO) 10 MG tablet Take 10 mg by mouth daily.    . furosemide (LASIX) 20 MG tablet Take 20 mg by mouth every other day.     . irbesartan (AVAPRO) 300 MG tablet Take 300 mg by mouth at bedtime.   0  . niacin 500 MG CR capsule Take 500 mg by mouth 2 (two) times daily with a meal.    . Omega-3 Fatty Acids (FISH OIL) 1200 MG CAPS Take 1,200 mg by mouth 2 (two) times daily.    [name] oxyCODONE-acetaminophen  (PERCOCET/ROXICET) 5-325 MG per tablet Take 1-2 tablets by mouth every 4 (four) hours as needed for moderate pain. (Patient taking differently: Take 1 tablet by mouth 2 (two) times daily. scheduled) 65 tablet 0  . tamoxifen (NOLVADEX) 20 MG tablet Take 1 tablet by mouth  daily 90 tablet 3  . verapamil (VERELAN PM) 240 MG 24 hr capsule Take 240 mg by mouth daily.     . Vitamin D, Cholecalciferol, 400 UNITS TABS Take 400 Units by mouth at bedtime.    [name] albuterol (PROVENTIL HFA;VENTOLIN HFA) 108 (90 BASE) MCG/ACT inhaler Inhale 2 puffs into the lungs every 6 (six) hours as needed for wheezing.    [name] ALPRAZolam (XANAX) 0.25 MG tablet Take 0.25 mg by mouth See admin instructions. Take 1 tablet (0.25 mg) by mouth prior to procedure (MRI)  0  . Tapentadol HCl (NUCYNTA) 100 MG TABS Take 100 mg by mouth every 12 (twelve) hours.      No results found for this or any previous visit (from the past 48 hour(s)). No results found.  Review of systems not obtained due to patient factors.  Blood pressure 169/51, pulse 70, temperature 98.5 F (36.9 C), temperature source Oral, resp. rate 18, SpO2 97 %.  The patient is awake alert and oriented. She is no facial asymmetry. Her gait is nonantalgic. Reflexes are decreased but strength is intact Assessment/Plan Impression is that of medial located pedicle screw. The plan is for removal of the screw.  Faythe Ghee, MD 04/20/2015, 8:48 AM

## 2015-04-20 NOTE — Anesthesia Procedure Notes (Signed)
Procedure Name: Intubation Date/Time: 04/20/2015 9:06 AM Performed by: Maryland Pink Pre-anesthesia Checklist: Patient identified, Emergency Drugs available, Suction available, Patient being monitored and Timeout performed Patient Re-evaluated:Patient Re-evaluated prior to inductionOxygen Delivery Method: Circle system utilized Preoxygenation: Pre-oxygenation with 100% oxygen Intubation Type: IV induction Ventilation: Mask ventilation without difficulty Laryngoscope Size: Mac and 3 Grade View: Grade I Tube type: Oral Tube size: 7.0 mm Number of attempts: 1 Airway Equipment and Method: Patient positioned with wedge pillow Placement Confirmation: ETT inserted through vocal cords under direct vision,  positive ETCO2 and breath sounds checked- equal and bilateral Secured at: 22 cm Tube secured with: Tape Dental Injury: Teeth and Oropharynx as per pre-operative assessment

## 2015-04-20 NOTE — Plan of Care (Signed)
Problem: Consults Goal: Diagnosis - Spinal Surgery Outcome: Completed/Met Date Met:  04/20/15 LT. LUMBAR REMOVAL OF HARDWARE

## 2015-04-20 NOTE — Anesthesia Preprocedure Evaluation (Deleted)
Anesthesia Evaluation  Patient identified by MRN, date of birth, ID band Patient awake    Reviewed: Allergy & Precautions, NPO status , Patient's Chart, lab work & pertinent test results  Airway Mallampati: II  TM Distance: >3 FB Neck ROM: Full    Dental no notable dental hx.    Pulmonary shortness of breath, with exertion and lying, sleep apnea ,    breath sounds clear to auscultation + decreased breath sounds      Cardiovascular hypertension, Pt. on medications + Peripheral Vascular Disease  Normal cardiovascular exam Rhythm:Regular Rate:Normal     Neuro/Psych  Headaches, PSYCHIATRIC DISORDERS Anxiety Depression    GI/Hepatic negative GI ROS, Neg liver ROS,   Endo/Other  Morbid obesity  Renal/GU negative Renal ROS     Musculoskeletal negative musculoskeletal ROS (+)   Abdominal   Peds  Hematology negative hematology ROS (+) anemia ,   Anesthesia Other Findings   Reproductive/Obstetrics negative OB ROS                             Anesthesia Physical  Anesthesia Plan  ASA: III  Anesthesia Plan: General   Post-op Pain Management:    Induction: Intravenous  Airway Management Planned: Oral ETT  Additional Equipment:   Intra-op Plan:   Post-operative Plan: Extubation in OR  Informed Consent: I have reviewed the patients History and Physical, chart, labs and discussed the procedure including the risks, benefits and alternatives for the proposed anesthesia with the patient or authorized representative who has indicated his/her understanding and acceptance.   Dental advisory given  Plan Discussed with: CRNA and Surgeon  Anesthesia Plan Comments:         Anesthesia Quick Evaluation

## 2015-04-20 NOTE — Op Note (Signed)
Preop diagnosis: Hardware failure Postop diagnosis: Same Procedure: Removal L5 pedicle screw left Surgeon: Sidi Dzikowski  After and placed the prone position the patient's back was prepped and draped in the usual sterile fashion. A P fluoroscopy was used to identify a good entry point in the paramedian region to go down to the pedicle screw at L5 on the left. We made an incision the appropriate place and then incised down to the fascia. We used sequential dilators and then placed our retractor above the inferior pedicle screw then gently opened in both directions to expose the pedicle screw. We took a few minutes to use microdissection technique to identify the screw and the rod superior and inferior to it. We then removed the top loading cap . We then used a metal cutting bur and cut through the rod just superior to the screw. We removed the small fragment of rod distally and then remove the screw without difficulty. There is no bleeding from the screw hole. We irrigated the wound copiously and closed in multiple layers of I Kroll on the muscle fascia subcutaneous and subcuticular tissues. Dermabond and Steri-Strips were placed on the skin. Shortness was then applied and the patient was extubated and taken to recovery in stable condition.

## 2015-04-20 NOTE — Anesthesia Postprocedure Evaluation (Signed)
Anesthesia Post Note  Patient: [name]  Procedure(s) Performed: Procedure(s) (LRB): HARDWARE REMOVAL LEFT LUMBAR FIVE SCREW (Left)  Anesthesia type: General  Patient location: PACU  Post pain: Pain level controlled  Post assessment: Post-op Vital signs reviewed  Last Vitals: BP 160/70 mmHg  Pulse 62  Temp(Src) 36.8 C (Oral)  Resp 16  SpO2 94%  Post vital signs: Reviewed  Level of consciousness: sedated  Complications: No apparent anesthesia complications

## 2015-04-20 NOTE — Progress Notes (Signed)
Utilization review completed.  

## 2015-04-20 NOTE — Transfer of Care (Signed)
Immediate Anesthesia Transfer of Care Note  Patient: [name]  Procedure(s) Performed: Procedure(s): HARDWARE REMOVAL LEFT LUMBAR FIVE SCREW (Left)  Patient Location: PACU  Anesthesia Type:General  Level of Consciousness: awake and alert   Airway & Oxygen Therapy: Patient Spontanous Breathing and Patient connected to nasal cannula oxygen  Post-op Assessment: Report given to RN and Post -op Vital signs reviewed and stable  Post vital signs: Reviewed and stable  Last Vitals:  Filed Vitals:   04/20/15 0647  BP: 169/51  Pulse: 70  Temp: 36.9 C  Resp: 18    Complications: No apparent anesthesia complications

## 2015-04-20 NOTE — Progress Notes (Signed)
Report given to erika rn as caregiver 

## 2015-04-20 NOTE — Anesthesia Preprocedure Evaluation (Signed)
Anesthesia Evaluation  Patient identified by MRN, date of birth, ID band Patient awake    Reviewed: Allergy & Precautions, NPO status , Patient's Chart, lab work & pertinent test results  Airway Mallampati: II  TM Distance: >3 FB Neck ROM: Full    Dental no notable dental hx.    Pulmonary shortness of breath, with exertion and lying, sleep apnea ,    breath sounds clear to auscultation + decreased breath sounds      Cardiovascular hypertension, Pt. on medications + Peripheral Vascular Disease  Normal cardiovascular exam Rhythm:Regular Rate:Normal     Neuro/Psych  Headaches, PSYCHIATRIC DISORDERS Anxiety Depression    GI/Hepatic negative GI ROS, Neg liver ROS,   Endo/Other  Morbid obesity  Renal/GU negative Renal ROS     Musculoskeletal negative musculoskeletal ROS (+)   Abdominal   Peds  Hematology negative hematology ROS (+) anemia ,   Anesthesia Other Findings   Reproductive/Obstetrics negative OB ROS                             Anesthesia Physical  Anesthesia Plan  ASA: III  Anesthesia Plan: General   Post-op Pain Management:    Induction: Intravenous  Airway Management Planned: Oral ETT  Additional Equipment:   Intra-op Plan:   Post-operative Plan: Extubation in OR  Informed Consent: I have reviewed the patients History and Physical, chart, labs and discussed the procedure including the risks, benefits and alternatives for the proposed anesthesia with the patient or authorized representative who has indicated his/her understanding and acceptance.   Dental advisory given  Plan Discussed with: CRNA  Anesthesia Plan Comments:         Anesthesia Quick Evaluation

## 2015-04-21 ENCOUNTER — Encounter (HOSPITAL_COMMUNITY): Payer: Self-pay | Admitting: Neurosurgery

## 2015-04-21 MED ORDER — OXYCODONE-ACETAMINOPHEN 5-325 MG PO TABS: 1.0000 | ORAL_TABLET | ORAL | Status: DC | PRN

## 2015-04-21 NOTE — Progress Notes (Signed)
Pt doing well. Pt given D/C instructions with Rx, verbal understanding was provided. Pt's incision is covered with Honeycomb dressing and is clean and dry. Pt's IV was removed prior to D/C. Pt D/C'd home via wheelchair @ 1000 per MD order. Pt is stable @ D/C and has no other needs at this time. Holli Humbles, RN

## 2015-04-21 NOTE — Discharge Instructions (Signed)
Wound Care Keep incision covered and dry for one week. You may shower from the neck down. You may remove outer bandage after one week.  Do not put any creams, lotions, or ointments on incision. Leave steri-strips on neck.  They will fall off by themselves. Activity Walk each and every day, increasing distance each day. No lifting greater than 5 lbs.  Avoid bending, arching, and twisting. No driving or riding in car until further notice at follow up appointment. If provided with back brace, wear when out of bed.  It is not necessary to wear brace in bed. Diet Resume your normal diet.  Return to Work Will be discussed at you follow up appointment. Call Your Doctor If Any of These Occur Redness, drainage, or swelling at the wound.  Temperature greater than 101 degrees. Severe pain not relieved by pain medication. Incision starts to come apart. Follow Up Appt Call today for appointment in 1-2 weeks ([phone]) or for problems.  If you have any hardware placed in your spine, you will need an x-ray before your appointment.

## 2015-04-21 NOTE — Discharge Summary (Signed)
  Physician Discharge Summary  Patient ID: [name] MRN: [id number] DOB/AGE: October 25, 1945 69 y.o.  Admit date: 04/20/2015 Discharge date: 04/21/2015  Admission Diagnoses:  Discharge Diagnoses:  Active Problems:   Lumbar radiculitis   Discharged Condition: good  Hospital Course: Surgery yesterday for removal of lumbar pedicle screw. Did well with improvement in left leg pain. Wound looked good. Home pod 1, specific instructions given.  Consults: None  Significant Diagnostic Studies: none  Treatments: surgery: Removal of L5 pedicle screw, left  Discharge Exam: Blood pressure 117/56, pulse 70, temperature 98.3 F (36.8 C), temperature source Oral, resp. rate 16, SpO2 96 %. Incision/Wound:clean and dry; no new neuro issues  Disposition: 03-Skilled Nursing Facility     Medication List    ASK your doctor about these medications        albuterol 108 (90 BASE) MCG/ACT inhaler  Commonly known as:  PROVENTIL HFA;VENTOLIN HFA  Inhale 2 puffs into the lungs every 6 (six) hours as needed for wheezing.     ALPRAZolam 0.25 MG tablet  Commonly known as:  XANAX  Take 0.25 mg by mouth See admin instructions. Take 1 tablet (0.25 mg) by mouth prior to procedure (MRI)     clopidogrel 75 MG tablet  Commonly known as:  PLAVIX  Take 75 mg by mouth daily.     escitalopram 10 MG tablet  Commonly known as:  LEXAPRO  Take 10 mg by mouth daily.     Fish Oil 1200 MG Caps  Take 1,200 mg by mouth 2 (two) times daily.     furosemide 20 MG tablet  Commonly known as:  LASIX  Take 20 mg by mouth every other day.     irbesartan 300 MG tablet  Commonly known as:  AVAPRO  Take 300 mg by mouth at bedtime.     niacin 500 MG CR capsule  Take 500 mg by mouth 2 (two) times daily with a meal.     NUCYNTA 100 MG Tabs  Generic drug:  Tapentadol HCl  Take 100 mg by mouth every 12 (twelve) hours.     oxyCODONE-acetaminophen 5-325 MG per tablet  Commonly known as:  PERCOCET/ROXICET  Take  1-2 tablets by mouth every 4 (four) hours as needed for moderate pain.     tamoxifen 20 MG tablet  Commonly known as:  NOLVADEX  Take 1 tablet by mouth  daily     verapamil 240 MG 24 hr capsule  Commonly known as:  VERELAN PM  Take 240 mg by mouth daily.     Vitamin D (Cholecalciferol) 400 UNITS Tabs  Take 400 Units by mouth at bedtime.         At home rest most of the time. Get up 9 or 10 times each day and take a 15 or 20 minute walk. No riding in the car and to your first postoperative appointment. If you have neck surgery you may shower from the chest down starting on the third postoperative day. If you had back surgery he may start showering on the third postoperative day with saran wrap wrapped around your incisional area 3 times. After the shower remove the saran wrap. Take pain medicine as needed and other medications as instructed. Call my office for an appointment.  SignedFaythe Ghee, MD 04/21/2015, 8:39 AM

## 2015-06-17 ENCOUNTER — Other Ambulatory Visit: Payer: Self-pay | Admitting: Family Medicine

## 2015-06-17 DIAGNOSIS — Z853 Personal history of malignant neoplasm of breast: Secondary | ICD-10-CM

## 2015-06-29 ENCOUNTER — Encounter: Payer: Self-pay | Admitting: Gastroenterology

## 2015-06-29 ENCOUNTER — Ambulatory Visit (INDEPENDENT_AMBULATORY_CARE_PROVIDER_SITE_OTHER): Payer: Medicare Other | Admitting: Pulmonary Disease

## 2015-06-29 ENCOUNTER — Encounter: Payer: Self-pay | Admitting: Pulmonary Disease

## 2015-06-29 VITALS — BP 122/78 | HR 72 | Ht 66.0 in | Wt 286.8 lb

## 2015-06-29 DIAGNOSIS — G4733 Obstructive sleep apnea (adult) (pediatric): Secondary | ICD-10-CM | POA: Diagnosis not present

## 2015-06-29 DIAGNOSIS — Z6841 Body Mass Index (BMI) 40.0 and over, adult: Secondary | ICD-10-CM | POA: Diagnosis not present

## 2015-06-29 DIAGNOSIS — Z9989 Dependence on other enabling machines and devices: Principal | ICD-10-CM

## 2015-06-29 NOTE — Progress Notes (Signed)
Chief Complaint  Patient presents with  . Follow-up    Former [place] pt seen for OSA. Pt states that she has been doing well on her CPAP. Pt states that she has been sleeping well and wears her CPAP 5-6h nightly.     History of Present Illness: [name] is a 69 y.o. female with OSA.    She was last seen by Dr. Gwenette [name] in 2014.  She has been doing well with CPAP.  She has nasal pillows mask.  She gets about 7 hrs sleep per night.  She feels rested.  Her main issue is related to back pain.  She had surgery last Spring with Dr. Hal [name].     TESTS: PSG 08/05/04 >> AHI 139 CPAP 04/01/15 to 06/29/15 >> used on 88 of 90 nights with average 6 hrs and 55 min.  Average AHI is 1.4 with CPAP 11 cm H2O.   PMhx >> HLD, HTN, HA, Anemia, Spinal stenosis, Breast cancer 2013, Depression  Current Outpatient Prescriptions on File Prior to Visit  Medication Sig  . ALPRAZolam (XANAX) 0.25 MG tablet Take 0.25 mg by mouth See admin instructions. Take 1 tablet (0.25 mg) by mouth prior to procedure (MRI)  . escitalopram (LEXAPRO) 10 MG tablet Take 10 mg by mouth daily.  . furosemide (LASIX) 20 MG tablet Take 20 mg by mouth every other day.   . irbesartan (AVAPRO) 300 MG tablet Take 300 mg by mouth at bedtime.   . niacin 500 MG CR capsule Take 500 mg by mouth 2 (two) times daily with a meal.  . Omega-3 Fatty Acids (FISH OIL) 1200 MG CAPS Take 1,200 mg by mouth 2 (two) times daily.  [name] oxyCODONE-acetaminophen (PERCOCET/ROXICET) 5-325 MG per tablet Take 1-2 tablets by mouth every 4 (four) hours as needed for moderate pain.  . tamoxifen (NOLVADEX) 20 MG tablet Take 1 tablet by mouth  daily  . verapamil (VERELAN PM) 240 MG 24 hr capsule Take 240 mg by mouth daily.   . Vitamin D, Cholecalciferol, 400 UNITS TABS Take 400 Units by mouth at bedtime.  [name] albuterol (PROVENTIL HFA;VENTOLIN HFA) 108 (90 BASE) MCG/ACT inhaler Inhale 2 puffs into the lungs every 6 (six) hours as needed for wheezing.  . clopidogrel  (PLAVIX) 75 MG tablet Take 75 mg by mouth daily.   Current Facility-Administered Medications on File Prior to Visit  Medication  . silver sulfADIAZINE (SILVADENE) 1 % cream    Past surgical hx, Allergies, Family hx, Social hx all reviewed.   Physical Exam: BP 122/78 mmHg  Pulse 72  Ht 5\' 6"  (1.676 m)  Wt 286 lb 12.8 oz (130.092 kg)  BMI 46.31 kg/m2  SpO2 97%  General - No distress ENT - No sinus tenderness, no oral exudate, no LAN, MP 2 Cardiac - s1s2 regular, no murmur Chest - No wheeze/rales/dullness Back - No focal tenderness Abd - Soft, non-tender Ext - No edema Neuro - Normal strength Skin - No rashes Psych - normal mood, and behavior   Assessment/Plan:  Obstructive sleep apnea. She is compliant with CPAP and reports benefit. Plan: - she will check with her DME to determine if she is eligible for new machine - continue CPAP 11 cm H2O  Obesity. Plan: - discussed importance of weight loss  Back pain. Plan: - advised her to d/w Dr. Hal [name] about resuming her physical therapy exercises   Chesley Mires, MD [facility] Pager:  [phone]

## 2015-06-29 NOTE — Patient Instructions (Signed)
Follow up in 1 year.

## 2015-07-02 DIAGNOSIS — G4733 Obstructive sleep apnea (adult) (pediatric): Secondary | ICD-10-CM | POA: Diagnosis not present

## 2015-07-07 DIAGNOSIS — D509 Iron deficiency anemia, unspecified: Secondary | ICD-10-CM | POA: Diagnosis not present

## 2015-07-07 DIAGNOSIS — Z23 Encounter for immunization: Secondary | ICD-10-CM | POA: Diagnosis not present

## 2015-07-16 DIAGNOSIS — I10 Essential (primary) hypertension: Secondary | ICD-10-CM | POA: Diagnosis not present

## 2015-07-16 DIAGNOSIS — M4316 Spondylolisthesis, lumbar region: Secondary | ICD-10-CM | POA: Diagnosis not present

## 2015-07-16 DIAGNOSIS — Z6841 Body Mass Index (BMI) 40.0 and over, adult: Secondary | ICD-10-CM | POA: Diagnosis not present

## 2015-08-03 ENCOUNTER — Ambulatory Visit
Admission: RE | Admit: 2015-08-03 | Discharge: 2015-08-03 | Disposition: A | Discharge status: Home / Self Care | Payer: Medicare Other | Source: Ambulatory Visit | Attending: Family Medicine | Admitting: Family Medicine

## 2015-08-03 DIAGNOSIS — Z853 Personal history of malignant neoplasm of breast: Secondary | ICD-10-CM

## 2015-08-13 ENCOUNTER — Other Ambulatory Visit: Payer: Self-pay | Admitting: Neurosurgery

## 2015-08-13 DIAGNOSIS — M4316 Spondylolisthesis, lumbar region: Secondary | ICD-10-CM

## 2015-08-23 ENCOUNTER — Ambulatory Visit
Admission: RE | Admit: 2015-08-23 | Discharge: 2015-08-23 | Disposition: A | Discharge status: Home / Self Care | Payer: Medicare Other | Source: Ambulatory Visit | Attending: Neurosurgery | Admitting: Neurosurgery

## 2015-08-23 DIAGNOSIS — M4316 Spondylolisthesis, lumbar region: Secondary | ICD-10-CM

## 2015-08-23 MED ORDER — GADOBENATE DIMEGLUMINE 529 MG/ML IV SOLN
20.0000 mL | Freq: Once | INTRAVENOUS | Status: AC | PRN
  Administered 2015-08-23: 20 mL via INTRAVENOUS

## 2015-09-01 ENCOUNTER — Ambulatory Visit (INDEPENDENT_AMBULATORY_CARE_PROVIDER_SITE_OTHER): Payer: Medicare Other | Admitting: Gastroenterology

## 2015-09-01 ENCOUNTER — Encounter: Payer: Self-pay | Admitting: Gastroenterology

## 2015-09-01 VITALS — BP 120/66 | HR 88 | Ht 66.0 in | Wt 286.8 lb

## 2015-09-01 DIAGNOSIS — Z1211 Encounter for screening for malignant neoplasm of colon: Secondary | ICD-10-CM | POA: Diagnosis not present

## 2015-09-01 DIAGNOSIS — Z86010 Personal history of colonic polyps: Secondary | ICD-10-CM

## 2015-09-01 MED ORDER — NA SULFATE-K SULFATE-MG SULF 17.5-3.13-1.6 GM/177ML PO SOLN: 1.0000 | Freq: Once | ORAL | Status: DC

## 2015-09-01 NOTE — Progress Notes (Deleted)
[name]    [id number]    1946-06-06  Primary Care Physician:HARRIS, Gwyndolyn Saxon, MD  Referring Physician: Shirline Frees, MD [address] [place], [place] [address]  Chief complaint:  ***  HPI:  Had back surgery in May and Sep 2016 Colonoscopy    Outpatient Encounter Prescriptions as of 09/01/2015  Medication Sig  . albuterol (PROVENTIL HFA;VENTOLIN HFA) 108 (90 BASE) MCG/ACT inhaler Inhale 2 puffs into the lungs every 6 (six) hours as needed for wheezing.  [name] aspirin 81 MG tablet Take 81 mg by mouth daily.  [name] escitalopram (LEXAPRO) 10 MG tablet Take 10 mg by mouth daily.  . furosemide (LASIX) 20 MG tablet Take 20 mg by mouth every other day.   . gabapentin (NEURONTIN) 600 MG tablet Take 600 mg by mouth at bedtime.  . irbesartan (AVAPRO) 300 MG tablet Take 300 mg by mouth at bedtime.   . niacin 500 MG CR capsule Take 500 mg by mouth 2 (two) times daily with a meal.  . Omega-3 Fatty Acids (FISH OIL) 1200 MG CAPS Take 1,200 mg by mouth 2 (two) times daily.  . tamoxifen (NOLVADEX) 20 MG tablet Take 1 tablet by mouth  daily  . traMADol (ULTRAM) 50 MG tablet take 1 tablet by mouth every 6 hours if needed  . verapamil (VERELAN PM) 240 MG 24 hr capsule Take 240 mg by mouth daily.   . Vitamin D, Cholecalciferol, 400 UNITS TABS Take 400 Units by mouth at bedtime.  . [DISCONTINUED] ALPRAZolam (XANAX) 0.25 MG tablet Take 0.25 mg by mouth See admin instructions. Take 1 tablet (0.25 mg) by mouth prior to procedure (MRI)  . [DISCONTINUED] clopidogrel (PLAVIX) 75 MG tablet Take 75 mg by mouth daily.  . [DISCONTINUED] oxyCODONE-acetaminophen (PERCOCET/ROXICET) 5-325 MG per tablet Take 1-2 tablets by mouth every 4 (four) hours as needed for moderate pain.   Facility-Administered Encounter Medications as of 09/01/2015  Medication  . silver sulfADIAZINE (SILVADENE) 1 % cream    Allergies as of 09/01/2015 - Review Complete 09/01/2015  Allergen Reaction Noted  .  Cymbalta [duloxetine hcl] Other (See Comments) 05/27/2012  . Lipitor [atorvastatin] Other (See Comments) 12/22/2014  . Maxzide [hydrochlorothiazide w-triamterene] Other (See Comments) 06/03/2012  . Pravastatin sodium Other (See Comments) 06/03/2012  . Simvastatin Other (See Comments) 04/12/2015  . Lodine [etodolac] Itching 01/30/2011    Past Medical History  Diagnosis Date  . OSA (obstructive sleep apnea)   . Hyperlipidemia     takes Niacin daily  . Headache(784.0)   . Blood transfusion   . Anemia   . Diverticulosis   . Radiation 10/29/11-11/24/11    left breast 6100 cGy  . Tinnitus   . History of colon polyps 1998    adenomatous  . Spinal stenosis   . Bronchitis     hx of  . Shortness of breath     with exertion  . Arthritis   . Breast cancer ([place]) 2013    left breast  . Pneumonia 4/12    Albuterol daily as needed  . Anxiety     takes Xanax daily  . Hypertension     takes Verapamil and Avapro daily  . Depression     takes Lexapro daily    Past Surgical History  Procedure Laterality Date  . Total knee arthroplasty  2006    bilateral  . Polypectomy  1990's  . Appendectomy  1978  . Thyroid cyst excision  1994  .  Transphenoidal / transnasal hypophysectomy / resection pituitary tumor  6/11  . Belpharoptosis repair    . Breast lumpectomy  08/2011    left  . Joint replacement Bilateral     bilateral knee  . Colonoscopy w/ polypectomy    . Breast lumpectomy with needle localization Right 02/06/2013    Procedure: RIGHT BREAST LUMPECTOMY WITH NEEDLE LOCALIZATION;  Surgeon: Harl Bowie, MD;  Location: [facility];  Service: General;  Laterality: Right;  . Hardware removal Left 04/20/2015    Procedure: HARDWARE REMOVAL LEFT LUMBAR FIVE SCREW;  Surgeon: Karie Chimera, MD;  Location: [facility];  Service: Neurosurgery;  Laterality: Left;    Family History  Problem Relation Age of Onset  . Heart disease Father   . Clotting disorder Father   . Stroke Father   . Stroke Mother    . Diabetes Mother   . Colon cancer Neg Hx   . Cancer Sister 67    Breast Cancer    Social History   Social History  . Marital Status: Married    Spouse Name: N/A  . Number of Children: 2  . Years of Education: N/A   Occupational History  . retired from Medical sales representative work    Social History Main Topics  . Smoking status: Never Smoker   . Smokeless tobacco: Never Used  . Alcohol Use: No  . Drug Use: No  . Sexual Activity: No   Other Topics Concern  . Not on file   Social History Narrative      Review of systems: Review of Systems  Constitutional: Negative for fever and chills.  HENT: Negative.   Eyes: Negative for blurred vision.  Respiratory: Negative for cough, shortness of breath and wheezing.   Cardiovascular: Negative for chest pain and palpitations.  Gastrointestinal: as per HPI Genitourinary: Negative for dysuria, urgency, frequency and hematuria.  Musculoskeletal: Negative for myalgias, back pain and joint pain.  Skin: Negative for itching and rash.  Neurological: Negative for dizziness, tremors, focal weakness, seizures and loss of consciousness.  Endo/Heme/Allergies: Negative for environmental allergies.  Psychiatric/Behavioral: Negative for depression, suicidal ideas and hallucinations.  All other systems reviewed and are negative.   Physical Exam: Filed Vitals:   09/01/15 1452  BP: 120/66  Pulse: 88   Gen:      No acute distress HEENT:  EOMI, sclera anicteric Neck:     No masses; no thyromegaly Lungs:    Clear to auscultation bilaterally; normal respiratory effort CV:         Regular rate and rhythm; no murmurs Abd:      + bowel sounds; soft, non-tender; no palpable masses, no distension Ext:    No edema; adequate peripheral perfusion Skin:      Warm and dry; no rash Neuro: alert and oriented x 3 Psych: normal mood and affect  Data Reviewed:  ***   Assessment and Plan/Recommendations:  ***

## 2015-09-01 NOTE — Patient Instructions (Signed)
Your colonoscopy has been scheduled at [facility] on 10/27/2015 at 11:30am to arrive at 10am Separate instructions have been given Follow up as needed

## 2015-09-02 NOTE — Progress Notes (Signed)
[name]    [id number]    1945-08-09  Primary Care Physician:HARRIS, Gwyndolyn Saxon, MD  Referring Physician: Shirline Frees, MD [place], [place] [address]  Chief complaint: h/o colon polyps  HPI: 69 yr [place] female who is here to discuss having a colonoscopy. She recently had back surgery in May and Sep 2016. She is on Plavix and she has obstructive sleep apnea. Her last colonoscopy in September 2006 was normal. Her first colonoscopy in 1997 showed a tubular adenoma. A colonoscopy in 2001 was also normal. She has a history of breast cancer and is status post radiation and lumpectomy. She has no GI symptoms other than occasional constipation. No family history of colon cancer. Denies any nausea, vomiting, abdominal pain, melena or bright red blood per rectum     Outpatient Encounter Prescriptions as of 09/01/2015  Medication Sig  . albuterol (PROVENTIL HFA;VENTOLIN HFA) 108 (90 BASE) MCG/ACT inhaler Inhale 2 puffs into the lungs every 6 (six) hours as needed for wheezing.  [name] aspirin 81 MG tablet Take 81 mg by mouth daily.  [name] escitalopram (LEXAPRO) 10 MG tablet Take 10 mg by mouth daily.  . furosemide (LASIX) 20 MG tablet Take 20 mg by mouth every other day.   . gabapentin (NEURONTIN) 600 MG tablet Take 600 mg by mouth at bedtime.  . irbesartan (AVAPRO) 300 MG tablet Take 300 mg by mouth at bedtime.   . niacin 500 MG CR capsule Take 500 mg by mouth 2 (two) times daily with a meal.  . Omega-3 Fatty Acids (FISH OIL) 1200 MG CAPS Take 1,200 mg by mouth 2 (two) times daily.  . tamoxifen (NOLVADEX) 20 MG tablet Take 1 tablet by mouth  daily  . traMADol (ULTRAM) 50 MG tablet take 1 tablet by mouth every 6 hours if needed  . verapamil (VERELAN PM) 240 MG 24 hr capsule Take 240 mg by mouth daily.   . Vitamin D, Cholecalciferol, 400 UNITS TABS Take 400 Units by mouth at bedtime.  . [DISCONTINUED] ALPRAZolam (XANAX) 0.25 MG tablet Take 0.25 mg  by mouth See admin instructions. Take 1 tablet (0.25 mg) by mouth prior to procedure (MRI)  . [DISCONTINUED] clopidogrel (PLAVIX) 75 MG tablet Take 75 mg by mouth daily.  . [DISCONTINUED] oxyCODONE-acetaminophen (PERCOCET/ROXICET) 5-325 MG per tablet Take 1-2 tablets by mouth every 4 (four) hours as needed for moderate pain.   Facility-Administered Encounter Medications as of 09/01/2015  Medication  . silver sulfADIAZINE (SILVADENE) 1 % cream    Allergies as of 09/01/2015 - Review Complete 09/01/2015  Allergen Reaction Noted  . Cymbalta [duloxetine hcl] Other (See Comments) 05/27/2012  . Lipitor [atorvastatin] Other (See Comments) 12/22/2014  . Maxzide [hydrochlorothiazide w-triamterene] Other (See Comments) 06/03/2012  . Pravastatin sodium Other (See Comments) 06/03/2012  . Simvastatin Other (See Comments) 04/12/2015  . Lodine [etodolac] Itching 01/30/2011    Past Medical History  Diagnosis Date  . OSA (obstructive sleep apnea)   . Hyperlipidemia     takes Niacin daily  . Headache(784.0)   . Blood transfusion   . Anemia   . Diverticulosis   . Radiation 10/29/11-11/24/11    left breast 6100 cGy  . Tinnitus   . History of colon polyps 1998    adenomatous  . Spinal stenosis   . Bronchitis     hx of  . Shortness of breath     with exertion  . Arthritis   .  Breast cancer ([place]) 2013    left breast  . Pneumonia 4/12    Albuterol daily as needed  . Anxiety     takes Xanax daily  . Hypertension     takes Verapamil and Avapro daily  . Depression     takes Lexapro daily    Past Surgical History  Procedure Laterality Date  . Total knee arthroplasty  2006    bilateral  . Polypectomy  1990's  . Appendectomy  1978  . Thyroid cyst excision  1994  . Transphenoidal / transnasal hypophysectomy / resection pituitary tumor  6/11  . Belpharoptosis repair    . Breast lumpectomy  08/2011    left  . Joint replacement Bilateral     bilateral knee  . Colonoscopy w/ polypectomy    .  Breast lumpectomy with needle localization Right 02/06/2013    Procedure: RIGHT BREAST LUMPECTOMY WITH NEEDLE LOCALIZATION;  Surgeon: Harl Bowie, MD;  Location: [facility];  Service: General;  Laterality: Right;  . Hardware removal Left 04/20/2015    Procedure: HARDWARE REMOVAL LEFT LUMBAR FIVE SCREW;  Surgeon: Karie Chimera, MD;  Location: [facility];  Service: Neurosurgery;  Laterality: Left;    Family History  Problem Relation Age of Onset  . Heart disease Father   . Clotting disorder Father   . Stroke Father   . Stroke Mother   . Diabetes Mother   . Colon cancer Neg Hx   . Cancer Sister 72    Breast Cancer    Social History   Social History  . Marital Status: Married    Spouse Name: N/A  . Number of Children: 2  . Years of Education: N/A   Occupational History  . retired from Medical sales representative work    Social History Main Topics  . Smoking status: Never Smoker   . Smokeless tobacco: Never Used  . Alcohol Use: No  . Drug Use: No  . Sexual Activity: No   Other Topics Concern  . Not on file   Social History Narrative      Review of systems: Review of Systems  Constitutional: Negative for fever and chills.  HENT: Negative.   Eyes: Negative for blurred vision.  Respiratory: Negative for cough, shortness of breath and wheezing.   Cardiovascular: Negative for chest pain and palpitations.  Gastrointestinal: as per HPI Genitourinary: Negative for dysuria, urgency, frequency and hematuria.  Musculoskeletal: Negative for myalgias, back pain and joint pain.  Skin: Negative for itching and rash.  Neurological: Negative for dizziness, tremors, focal weakness, seizures and loss of consciousness.  Endo/Heme/Allergies: Negative for environmental allergies.  Psychiatric/Behavioral: Negative for depression, suicidal ideas and hallucinations.  All other systems reviewed and are negative.   Physical Exam: Filed Vitals:   09/01/15 1452  BP: 120/66  Pulse: 88   Gen:      No acute  distress HEENT:  EOMI, sclera anicteric Neck:     No masses; no thyromegaly Lungs:    Clear to auscultation bilaterally; normal respiratory effort CV:         Regular rate and rhythm; no murmurs Abd:      + bowel sounds; soft, non-tender; no palpable masses, no distension Ext:    No edema; adequate peripheral perfusion Skin:      Warm and dry; no rash Neuro: alert and oriented x 3 Psych: normal mood and affect  Data Reviewed:  As per HPI   Assessment and Plan/Recommendations:  70 year old female with history of breast cancer status post  lumpectomy with radiation, obesity, obstructive sleep apnea here to discuss colonoscopy. She was due for screening colonoscopy in 2016. We will schedule the procedure at hospital given history of severe OSA The risks and benefits as well as alternatives of endoscopic procedure(s) have been discussed and reviewed. All questions answered. The patient agrees to proceed. Discuss with cardiology regarding holding Plavix for 5-7 days prior to the procedure Return as needed  K. Denzil Magnuson , MD [phone] Mon-Fri 8a-5p [phone] after 5p, weekends, holidays

## 2015-09-20 ENCOUNTER — Other Ambulatory Visit: Payer: Self-pay | Admitting: Oncology

## 2015-10-26 ENCOUNTER — Encounter (HOSPITAL_COMMUNITY): Payer: Self-pay | Admitting: *Deleted

## 2015-10-27 ENCOUNTER — Ambulatory Visit (HOSPITAL_COMMUNITY): Payer: Medicare Other | Admitting: Anesthesiology

## 2015-10-27 ENCOUNTER — Encounter (HOSPITAL_COMMUNITY): Payer: Self-pay | Admitting: Gastroenterology

## 2015-10-27 ENCOUNTER — Ambulatory Visit (HOSPITAL_COMMUNITY)
Admission: RE | Admit: 2015-10-27 | Discharge: 2015-10-27 | Disposition: A | Discharge status: Home / Self Care | Payer: Medicare Other | Source: Ambulatory Visit | Attending: Gastroenterology | Admitting: Gastroenterology

## 2015-10-27 ENCOUNTER — Encounter (HOSPITAL_COMMUNITY)
Admission: RE | Disposition: A | Discharge status: Home / Self Care | Payer: Self-pay | Source: Ambulatory Visit | Attending: Gastroenterology

## 2015-10-27 DIAGNOSIS — E785 Hyperlipidemia, unspecified: Secondary | ICD-10-CM | POA: Diagnosis not present

## 2015-10-27 DIAGNOSIS — E119 Type 2 diabetes mellitus without complications: Secondary | ICD-10-CM | POA: Insufficient documentation

## 2015-10-27 DIAGNOSIS — K5730 Diverticulosis of large intestine without perforation or abscess without bleeding: Secondary | ICD-10-CM | POA: Diagnosis not present

## 2015-10-27 DIAGNOSIS — K648 Other hemorrhoids: Secondary | ICD-10-CM | POA: Diagnosis not present

## 2015-10-27 DIAGNOSIS — G4733 Obstructive sleep apnea (adult) (pediatric): Secondary | ICD-10-CM | POA: Insufficient documentation

## 2015-10-27 DIAGNOSIS — F329 Major depressive disorder, single episode, unspecified: Secondary | ICD-10-CM | POA: Insufficient documentation

## 2015-10-27 DIAGNOSIS — Z96653 Presence of artificial knee joint, bilateral: Secondary | ICD-10-CM | POA: Diagnosis not present

## 2015-10-27 DIAGNOSIS — D123 Benign neoplasm of transverse colon: Secondary | ICD-10-CM | POA: Diagnosis not present

## 2015-10-27 DIAGNOSIS — Z86010 Personal history of colonic polyps: Secondary | ICD-10-CM

## 2015-10-27 DIAGNOSIS — Z1211 Encounter for screening for malignant neoplasm of colon: Secondary | ICD-10-CM | POA: Insufficient documentation

## 2015-10-27 DIAGNOSIS — Z853 Personal history of malignant neoplasm of breast: Secondary | ICD-10-CM | POA: Insufficient documentation

## 2015-10-27 DIAGNOSIS — Z7982 Long term (current) use of aspirin: Secondary | ICD-10-CM | POA: Insufficient documentation

## 2015-10-27 DIAGNOSIS — I10 Essential (primary) hypertension: Secondary | ICD-10-CM | POA: Insufficient documentation

## 2015-10-27 DIAGNOSIS — F419 Anxiety disorder, unspecified: Secondary | ICD-10-CM | POA: Insufficient documentation

## 2015-10-27 HISTORY — DX: Type 2 diabetes mellitus without complications: E11.9

## 2015-10-27 HISTORY — PX: COLONOSCOPY WITH PROPOFOL: SHX5780

## 2015-10-27 SURGERY — COLONOSCOPY WITH PROPOFOL
Anesthesia: Monitor Anesthesia Care

## 2015-10-27 MED ORDER — PROPOFOL 500 MG/50ML IV EMUL
INTRAVENOUS | Status: DC | PRN
  Administered 2015-10-27: 150 ug/kg/min via INTRAVENOUS

## 2015-10-27 MED ORDER — LACTATED RINGERS IV SOLN
INTRAVENOUS | Status: DC
  Administered 2015-10-27: 12:00:00 via INTRAVENOUS

## 2015-10-27 MED ORDER — LIDOCAINE HCL (CARDIAC) 20 MG/ML IV SOLN
INTRAVENOUS | Status: DC | PRN
  Administered 2015-10-27: 100 mg via INTRATRACHEAL

## 2015-10-27 MED ORDER — SODIUM CHLORIDE 0.9 % IV SOLN: INTRAVENOUS | Status: DC

## 2015-10-27 MED ORDER — PROPOFOL 10 MG/ML IV BOLUS
INTRAVENOUS | Status: DC | PRN
  Administered 2015-10-27 (×3): 20 mg via INTRAVENOUS
  Administered 2015-10-27: 40 mg via INTRAVENOUS

## 2015-10-27 NOTE — Op Note (Signed)
[facility] Patient Name: [name] Procedure Date : 10/27/2015 MRN: [id number] Attending MD: Mauri Pole , MD Date of Birth: 21-Dec-1945 CSN: [id number] Age: 70 Admit Type: Outpatient Procedure:                Colonoscopy Indications:              Screening for colorectal malignant neoplasm Providers:                Mauri Pole, MD, Jobe Igo, RN,                            Corliss Parish, Technician Referring MD:              Medicines:                Monitored Anesthesia Care Complications:            No immediate complications. Estimated Blood Loss:     Estimated blood loss was minimal. Procedure:                Pre-Anesthesia Assessment:                           - Prior to the procedure, a History and Physical                            was performed, and patient medications and                            allergies were reviewed. The patient's tolerance of                            previous anesthesia was also reviewed. The risks                            and benefits of the procedure and the sedation                            options and risks were discussed with the patient.                            All questions were answered, and informed consent                            was obtained. Prior Anticoagulants: The patient has                            taken no previous anticoagulant or antiplatelet                            agents. ASA Grade Assessment: III - A patient with                            severe systemic disease. After reviewing the risks  and benefits, the patient was deemed in                            satisfactory condition to undergo the procedure.                           After obtaining informed consent, the colonoscope                            was passed under direct vision. Throughout the                            procedure, the patient's blood pressure, pulse, and                  oxygen saturations were monitored continuously. The                            EC-[id number] scope was introduced through                            the anus and advanced to the the cecum, identified                            by appendiceal orifice and ileocecal valve. The                            colonoscopy was somewhat difficult due to                            unsatisfactory bowel prep. Successful completion of                            the procedure was aided by lavage. The patient                            tolerated the procedure well. The quality of the                            bowel preparation was adequate. The quality of the                            bowel preparation was evaluated using the BBPS                            [facility] Bowel Preparation Scale) with scores of:                            Right Colon = 2 (minor amount of residual staining,                            small fragments of stool and/or opaque liquid, but  mucosa seen well), Transverse Colon = 2 (minor                            amount of residual staining, small fragments of                            stool and/or opaque liquid, but mucosa seen well)                            and Left Colon = 2 (minor amount of residual                            staining, small fragments of stool and/or opaque                            liquid, but mucosa seen well). The total BBPS score                            equals 6. The quality of the bowel preparation was                            fair. The quality of the bowel preparation was                            adequate to identify polyps 6 mm and larger in                            size. The ileocecal valve, appendiceal orifice, and                            rectum were photographed. Scope In: 11:49:10 AM Scope Out: 12:14:41 PM Scope Withdrawal Time: 0 hours 13 minutes 3 seconds  Total Procedure Duration: 0 hours  25 minutes 31 seconds  Findings:      A 15 mm polyp was found in the mid transverse colon. The polyp was       sessile. The polyp was removed with a cold snare. Resection and       retrieval were complete. To prevent bleeding post-intervention, one       hemostatic clip was successfully placed. There was no bleeding at the       end of the procedure. Estimated blood loss was minimal.      Multiple small and large-mouthed diverticula were found in the sigmoid       colon, descending colon, transverse colon and ascending colon. There was       no evidence of diverticular bleeding.      Non-bleeding internal hemorrhoids were found during retroflexion. The       hemorrhoids were small. Impression:               - Preparation of the colon was fair.                           - One 15 mm polyp in the mid transverse colon,  removed with a cold snare. Resected and retrieved.                            Clip was placed.                           - Moderate diverticulosis in the sigmoid colon, in                            the descending colon, in the transverse colon and                            in the ascending colon. There was no evidence of                            diverticular bleeding.                           - Non-bleeding internal hemorrhoids. Moderate Sedation:      Moderate sedation was not administered      N/A Recommendation:           - Patient has a contact number available for                            emergencies. The signs and symptoms of potential                            delayed complications were discussed with the                            patient. Return to normal activities tomorrow.                            Written discharge instructions were provided to the                            patient.                           - Resume previous diet.                           - Continue present medications.                           - Await  pathology results.                           - Repeat colonoscopy in 3 years for surveillance.                           - Return to GI clinic PRN. Procedure Code(s):        --- Professional ---                           [phone], Colonoscopy, flexible; with  removal of                            tumor(s), polyp(s), or other lesion(s) by snare                            technique Diagnosis Code(s):        --- Professional ---                           Z12.11, Encounter for screening for malignant                            neoplasm of colon                           K64.8, Other hemorrhoids                           D12.3, Benign neoplasm of transverse colon (hepatic                            flexure or splenic flexure)                           K57.30, Diverticulosis of large intestine without                            perforation or abscess without bleeding CPT copyright 2016 [employer]. All rights reserved. The codes documented in this report are preliminary and upon coder review may  be revised to meet current compliance requirements. Mauri Pole, MD 10/27/2015 12:25:52 PM This report has been signed electronically. Number of Addenda: 0

## 2015-10-27 NOTE — Discharge Instructions (Signed)
Colonoscopy, Care After °Refer to this sheet in the next few weeks. These instructions provide you with information on caring for yourself after your procedure. Your health care provider may also give you more specific instructions. Your treatment has been planned according to current medical practices, but problems sometimes occur. Call your health care provider if you have any problems or questions after your procedure. °WHAT TO EXPECT AFTER THE PROCEDURE  °After your procedure, it is typical to have the following: °· A small amount of blood in your stool. °· Moderate amounts of gas and mild abdominal cramping or bloating. °HOME CARE INSTRUCTIONS °· Do not drive, operate machinery, or sign important documents for 24 hours. °· You may shower and resume your regular physical activities, but move at a slower pace for the first 24 hours. °· Take frequent rest periods for the first 24 hours. °· Walk around or put a warm pack on your abdomen to help reduce abdominal cramping and bloating. °· Drink enough fluids to keep your urine clear or pale yellow. °· You may resume your normal diet as instructed by your health care provider. Avoid heavy or fried foods that are hard to digest. °· Avoid drinking alcohol for 24 hours or as instructed by your health care provider. °· Only take over-the-counter or prescription medicines as directed by your health care provider. °· If a tissue sample (biopsy) was taken during your procedure: °¨ Do not take aspirin or blood thinners for 7 days, or as instructed by your health care provider. °¨ Do not drink alcohol for 7 days, or as instructed by your health care provider. °¨ Eat soft foods for the first 24 hours. °SEEK MEDICAL CARE IF: °You have persistent spotting of blood in your stool 2-3 days after the procedure. °SEEK IMMEDIATE MEDICAL CARE IF: °· You have more than a small spotting of blood in your stool. °· You pass large blood clots in your stool. °· Your abdomen is swollen  (distended). °· You have nausea or vomiting. °· You have a fever. °· You have increasing abdominal pain that is not relieved with medicine. °  °This information is not intended to replace advice given to you by your health care provider. Make sure you discuss any questions you have with your health care provider. °  °Document Released: 02/29/2004 Document Revised: 05/07/2013 Document Reviewed: 03/24/2013 °Elsevier Interactive Patient Education ©2016 [employer] ° °

## 2015-10-27 NOTE — Anesthesia Preprocedure Evaluation (Addendum)
Anesthesia Evaluation  Patient identified by MRN, date of birth, ID band Patient awake    Reviewed: Allergy & Precautions, NPO status , Patient's Chart, lab work & pertinent test results  History of Anesthesia Complications Negative for: history of anesthetic complications  Airway Mallampati: II  TM Distance: >3 FB Neck ROM: Full    Dental no notable dental hx. (+) Dental Advisory Given   Pulmonary sleep apnea and Continuous Positive Airway Pressure Ventilation ,    Pulmonary exam normal breath sounds clear to auscultation       Cardiovascular hypertension, Pt. on medications Normal cardiovascular exam Rhythm:Regular Rate:Normal     Neuro/Psych  Headaches, PSYCHIATRIC DISORDERS Anxiety Depression    GI/Hepatic negative GI ROS, Neg liver ROS,   Endo/Other  diabetesMorbid obesity  Renal/GU negative Renal ROS  negative genitourinary   Musculoskeletal  (+) Arthritis ,   Abdominal   Peds negative pediatric ROS (+)  Hematology negative hematology ROS (+)   Anesthesia Other Findings   Reproductive/Obstetrics negative OB ROS                            Anesthesia Physical Anesthesia Plan  ASA: III  Anesthesia Plan: MAC   Post-op Pain Management:    Induction: Intravenous  Airway Management Planned: Nasal Cannula  Additional Equipment:   Intra-op Plan:   Post-operative Plan:   Informed Consent: I have reviewed the patients History and Physical, chart, labs and discussed the procedure including the risks, benefits and alternatives for the proposed anesthesia with the patient or authorized representative who has indicated his/her understanding and acceptance.   Dental advisory given  Plan Discussed with: CRNA  Anesthesia Plan Comments:         Anesthesia Quick Evaluation

## 2015-10-27 NOTE — Anesthesia Postprocedure Evaluation (Signed)
Anesthesia Post Note  Patient: [name]  Procedure(s) Performed: Procedure(s) (LRB): COLONOSCOPY WITH PROPOFOL (N/A)  Patient location during evaluation: PACU Anesthesia Type: MAC Level of consciousness: awake and alert Pain management: pain level controlled Vital Signs Assessment: post-procedure vital signs reviewed and stable Respiratory status: spontaneous breathing, nonlabored ventilation, respiratory function stable and patient connected to nasal cannula oxygen Cardiovascular status: blood pressure returned to baseline and stable Postop Assessment: no signs of nausea or vomiting Anesthetic complications: no    Last Vitals:  Filed Vitals:   10/27/15 1230 10/27/15 1240  BP: 159/53 157/52  Pulse: 69 61  Temp:    Resp: 14 14    Last Pain: There were no vitals filed for this visit.               Denim Kalmbach JENNETTE

## 2015-10-27 NOTE — Transfer of Care (Signed)
Immediate Anesthesia Transfer of Care Note  Patient: [name]  Procedure(s) Performed: Procedure(s): COLONOSCOPY WITH PROPOFOL (N/A)  Patient Location: Endoscopy Unit  Anesthesia Type:MAC  Level of Consciousness: awake  Airway & Oxygen Therapy: Patient Spontanous Breathing and Patient connected to face mask oxygen  Post-op Assessment: Report given to RN  Post vital signs: Reviewed and stable  Last Vitals:  Filed Vitals:   10/27/15 1027  BP: 195/74  Pulse: 66  Resp: 16    Complications: No apparent anesthesia complications

## 2015-10-27 NOTE — H&P (Signed)
[facility] History and Physical   Primary Care Physician:  Shirline Frees, MD   Reason for Procedure:   screening colonoscopy  Plan:    Colonoscopy with possible intervention     HPI: [name] is a 70 y.o. female multiple comorbidities including severe obstructive sleep apnea here for screening colonoscopy with possible intervention. Denies any nausea, vomiting, abdominal pain, melena or bright red blood per rectum    Past Medical History  Diagnosis Date  . OSA (obstructive sleep apnea)   . Hyperlipidemia     takes Niacin daily  . Headache(784.0)   . Blood transfusion   . Anemia   . Diverticulosis   . Radiation 10/29/11-11/24/11    left breast 6100 cGy  . Tinnitus   . History of colon polyps 1998    adenomatous  . Spinal stenosis   . Bronchitis     hx of  . Shortness of breath     with exertion  . Arthritis   . Breast cancer ([place]) 2013    left breast  . Pneumonia 4/12    Albuterol daily as needed  . Anxiety     takes Xanax daily  . Hypertension     takes Verapamil and Avapro daily  . Depression     takes Lexapro daily  . Diabetes mellitus without complication ([place])     boderline    Past Surgical History  Procedure Laterality Date  . Total knee arthroplasty  2006    bilateral  . Polypectomy  1990's  . Appendectomy  1978  . Thyroid cyst excision  1994  . Transphenoidal / transnasal hypophysectomy / resection pituitary tumor  6/11  . Belpharoptosis repair    . Breast lumpectomy  08/2011    left  . Joint replacement Bilateral     bilateral knee  . Colonoscopy w/ polypectomy    . Breast lumpectomy with needle localization Right 02/06/2013    Procedure: RIGHT BREAST LUMPECTOMY WITH NEEDLE LOCALIZATION;  Surgeon: Harl Bowie, MD;  Location: [facility];  Service: General;  Laterality: Right;  . Hardware removal Left 04/20/2015    Procedure: HARDWARE REMOVAL LEFT LUMBAR FIVE SCREW;  Surgeon: Karie Chimera, MD;  Location: [facility];  Service:  Neurosurgery;  Laterality: Left;    Prior to Admission medications   Medication Sig Start Date End Date Taking? Authorizing Provider  albuterol (PROVENTIL HFA;VENTOLIN HFA) 108 (90 BASE) MCG/ACT inhaler Inhale 2 puffs into the lungs every 6 (six) hours as needed for wheezing.   Yes Historical Provider, MD  aspirin 81 MG tablet Take 81 mg by mouth daily.   Yes Historical Provider, MD  escitalopram (LEXAPRO) 10 MG tablet Take 10 mg by mouth daily.   Yes Historical Provider, MD  furosemide (LASIX) 20 MG tablet Take 20 mg by mouth every other day.    Yes Historical Provider, MD  irbesartan (AVAPRO) 300 MG tablet Take 300 mg by mouth at bedtime.  12/09/14  Yes Historical Provider, MD  niacin 500 MG CR capsule Take 500 mg by mouth 2 (two) times daily with a meal.   Yes Historical Provider, MD  Omega-3 Fatty Acids (FISH OIL) 1200 MG CAPS Take 1,200 mg by mouth 2 (two) times daily.   Yes Historical Provider, MD  pregabalin (LYRICA) 150 MG capsule Take 150 mg by mouth at bedtime.   Yes Historical Provider, MD  tamoxifen (NOLVADEX) 20 MG tablet Take 1 tablet by mouth  daily 09/21/15  Yes Chauncey Cruel, MD  traMADol Veatrice Bourbon)  50 MG tablet take 1 tablet by mouth every 6 hours as needed for pain 08/03/15  Yes Historical Provider, MD  verapamil (VERELAN PM) 240 MG 24 hr capsule Take 240 mg by mouth daily.    Yes Historical Provider, MD  Vitamin D, Cholecalciferol, 400 UNITS TABS Take 400 Units by mouth at bedtime.   Yes Historical Provider, MD  gabapentin (NEURONTIN) 600 MG tablet Take 600 mg by mouth at bedtime.    Historical Provider, MD  Na Sulfate-K Sulfate-Mg Sulf (SUPREP BOWEL PREP) SOLN Take 1 kit by mouth once. Patient not taking: Reported on 10/26/2015 09/01/15   Mauri Pole, MD    No current facility-administered medications for this encounter.   Facility-Administered Medications Ordered in Other Encounters  Medication Dose Route Frequency Provider Last Rate Last Dose  . silver sulfADIAZINE  (SILVADENE) 1 % cream   Topical BID Gery Pray, MD   1 application at 43/60/67 0934    Allergies as of 09/01/2015 - Review Complete 09/01/2015  Allergen Reaction Noted  . Cymbalta [duloxetine hcl] Other (See Comments) 05/27/2012  . Lipitor [atorvastatin] Other (See Comments) 12/22/2014  . Maxzide [hydrochlorothiazide w-triamterene] Other (See Comments) 06/03/2012  . Pravastatin sodium Other (See Comments) 06/03/2012  . Simvastatin Other (See Comments) 04/12/2015  . Lodine [etodolac] Itching 01/30/2011    Family History  Problem Relation Age of Onset  . Heart disease Father   . Clotting disorder Father   . Stroke Father   . Stroke Mother   . Diabetes Mother   . Colon cancer Neg Hx   . Cancer Sister 31    Breast Cancer    Social History   Social History  . Marital Status: Married    Spouse Name: N/A  . Number of Children: 2  . Years of Education: N/A   Occupational History  . retired from Medical sales representative work    Social History Main Topics  . Smoking status: Never Smoker   . Smokeless tobacco: Never Used  . Alcohol Use: No  . Drug Use: No  . Sexual Activity: No   Other Topics Concern  . Not on file   Social History Narrative    Review of Systems:  All other review of systems negative except as mentioned in the HPI.  Physical Exam: Vital signs in last 24 hours:     General:   Alert,  Well-developed, well-nourished, pleasant and cooperative in NAD Lungs:  Clear throughout to auscultation.   Heart:  Regular rate and rhythm; no murmurs, clicks, rubs,  or gallops. Abdomen:  Soft, nontender and nondistended. Normal bowel sounds.   Neuro/Psych:  Alert and cooperative. Normal mood and affect. A and O x 3   '@K' .Denzil Magnuson, MD [facility] [phone] (pager) 10/27/2015 10:04 AM@

## 2015-10-27 NOTE — Anesthesia Procedure Notes (Signed)
Procedure Name: MAC Date/Time: 10/27/2015 11:43 AM Performed by: Barrington Ellison Pre-anesthesia Checklist: Patient identified, Emergency Drugs available, Suction available, Patient being monitored and Timeout performed Patient Re-evaluated:Patient Re-evaluated prior to inductionOxygen Delivery Method: Simple face mask

## 2015-10-28 ENCOUNTER — Encounter (HOSPITAL_COMMUNITY): Payer: Self-pay | Admitting: Gastroenterology

## 2015-11-29 ENCOUNTER — Other Ambulatory Visit: Payer: Self-pay | Admitting: Oncology

## 2016-01-20 ENCOUNTER — Encounter: Payer: Self-pay | Admitting: Neurology

## 2016-01-20 ENCOUNTER — Ambulatory Visit (INDEPENDENT_AMBULATORY_CARE_PROVIDER_SITE_OTHER): Payer: Medicare Other | Admitting: Neurology

## 2016-01-20 VITALS — BP 151/75 | HR 72 | Ht 66.0 in | Wt 288.0 lb

## 2016-01-20 DIAGNOSIS — E538 Deficiency of other specified B group vitamins: Secondary | ICD-10-CM

## 2016-01-20 DIAGNOSIS — G8929 Other chronic pain: Secondary | ICD-10-CM

## 2016-01-20 DIAGNOSIS — M545 Low back pain: Secondary | ICD-10-CM | POA: Diagnosis not present

## 2016-01-20 DIAGNOSIS — G629 Polyneuropathy, unspecified: Secondary | ICD-10-CM

## 2016-01-20 DIAGNOSIS — R208 Other disturbances of skin sensation: Secondary | ICD-10-CM | POA: Diagnosis not present

## 2016-01-20 NOTE — Patient Instructions (Signed)
Remember to drink plenty of fluid, eat healthy meals and do not skip any meals. Try to eat protein with a every meal and eat a healthy snack such as fruit or nuts in between meals. Try to keep a regular sleep-wake schedule and try to exercise daily, particularly in the form of walking, 20-30 minutes a day, if you can.   As far as your medications are concerned, I would like to suggest: continue current medications  As far as diagnostic testing: labs  I would like to see you back in 6 months, sooner if we need to. Please call us with any interim questions, concerns, problems, updates or refill requests.   Our phone number is [phone]. We also have an after hours call service for urgent matters and there is a physician on-call for urgent questions. For any emergencies you know to call 911 or go to the nearest emergency room

## 2016-01-20 NOTE — Progress Notes (Signed)
[facility]    Provider:  Dr Jaynee Eagles Referring Provider: Shirline Frees, MD Primary Care Physician:  Shirline Frees, MD  CC:  neuropathy  HPI:  [name] is a 70 y.o. female here for evaluation of neuropathy. PMHx HTN, HLD, anxiety, OSA, depression, RLS, Type 2 diabetes, LBP, morbid obesity. She has burning in the feet. The burning in the feet started over a year ago in the setting of diabetes (before her low back surgery) and now with numbness in the feet after surgery.  Numbness started after back surgery last year in May. She has a difficult time exercising due to the burning pain in her feet as well as the numbness and her chronic back pain. She has a lot of back pain , surgery completed by Dr. Hal Neer. She has cramping in the legs and also in the feet. The symptoms are not painful during the day, only at night when laying down when they can be very painful and keep her up at night. She was started on Amitriptyline recently, it is helping her pain in the feet. Lyrica did not help. The Gabapentin helped also in the past but she was taken off to start Lyrica and she can't remember why. Now the numbness is  just in the left foot, burning is in the toes bilaterally, numbness is in the left foot in the toes and in the side of the ankle the outer side and that started right after surgery. No falls. She is off balance especially when she gets up at night. No weakness. No falls. No other focal neurologic deficits or complaints.   Reviewed notes, labs and imaging from outside physicians, which showed: CMP 01/05/2016 normal (creatinine 1.08), HgbA1c 6.8, cbc nml, ldl 162,   TSH 1.15, (2015)  MRI of the lumbar spine was completed in January 2017 (personally reviewed images and agree with the following):  T11-12: Mild disc bulging and minimal facet arthrosis without stenosis, unchanged.  T12-L1: Minimal disc bulging and minimal facet arthrosis without stenosis,  unchanged.  L1-2: Moderate facet arthrosis without disc herniation or stenosis, unchanged.  L2-3: Minimal disc bulging, mild ligamentum flavum hypertrophy, and moderate facet arthrosis without significant stenosis, unchanged.  L3-4: Prior laminectomies and fusion. Enhancing epidural postoperative granulation tissue. No stenosis.  L4-5: Prior laminectomies and fusion. Enhancing epidural postoperative granulation tissue. Spinal canal is decompressed. Evaluation of the neural foramina is partially limited by magnetic susceptibility artifact without gross stenosis identified.  L5-S1: Negative.  IMPRESSION: 1. Transitional lumbosacral anatomy as above. 2. Prior posterior decompression at L3-4 and L4-5 without evidence of significant residual stenosis. 3. Mild disc and mild-to-moderate facet degeneration elsewhere without significant stenosis.  Reviewed records from primary care. Patient presented for continued numbness and pain in the feet for over a year. This seemed to get worse after back surgery in May 2016. There is peripheral edema. She had a nerve conduction EMG study by report which showed neuropathy. She had been on gabapentin but it made her sleepy. She was switched to Lyrica which she can only take once a day due to sleepiness. Even once a day at bedtime 8 her dizzy. She did not take the furosemide daily because she gets cramps when she takes it. Patient is seen for diabetes, hypertension, hyperlipidemia, iron deficiency anemia and chronic kidney disease, her A1c was 6.8 and her last GFR was 68. She was diagnosed with diabetic polyneuropathy, suspicion her pain is diabetic, amitriptyline was started, Lyrica was stopped, primary care did not receive the  actual nerve conduction velocities EMG from Dr. Hal Neer also did not have had to send to [name].   Review of Systems: Patient complains of symptoms per HPI as well as the following symptoms: fatigue, blurred vision, shortness of breath,  feelin gof cold, hearing loss, joint swelling, itching, numbness, dizziness, anxiety, not enough sleep, decreased energy. Pertinent negatives per HPI. All others negative.   Social History   Social History  . Marital Status: Married    Spouse Name: Shanon Brow  . Number of Children: 2  . Years of Education: 12   Occupational History  . Retired Designer, fashion/clothing    Social History Main Topics  . Smoking status: Never Smoker   . Smokeless tobacco: Never Used  . Alcohol Use: No  . Drug Use: No  . Sexual Activity: No   Other Topics Concern  . Not on file   Social History Narrative   Lives with husband   Caffeine use: 2-3 servings per day    Family History  Problem Relation Age of Onset  . Heart disease Father   . Clotting disorder Father   . Stroke Father   . Stroke Mother   . Diabetes Mother   . Colon cancer Neg Hx   . Neuropathy Neg Hx   . Cancer Sister 58    Breast Cancer    Past Medical History  Diagnosis Date  . OSA (obstructive sleep apnea)   . Hyperlipidemia     takes Niacin daily  . Headache(784.0)   . Blood transfusion   . Anemia   . Diverticulosis   . Radiation 10/29/11-11/24/11    left breast 6100 cGy  . Tinnitus   . History of colon polyps 1998    adenomatous  . Spinal stenosis   . Bronchitis     hx of  . Shortness of breath     with exertion  . Arthritis   . Breast cancer ([place]) 2013    left breast  . Pneumonia 4/12    Albuterol daily as needed  . Anxiety     takes Xanax daily  . Hypertension     takes Verapamil and Avapro daily  . Depression     takes Lexapro daily  . Diabetes mellitus without complication ([place])     boderline    Past Surgical History  Procedure Laterality Date  . Total knee arthroplasty  2006    bilateral  . Polypectomy  1990's  . Appendectomy  1978  . Thyroid cyst excision  1994  . Transphenoidal / transnasal hypophysectomy / resection pituitary tumor  6/11  . Belpharoptosis repair    . Breast lumpectomy  08/2011     left  . Joint replacement Bilateral     bilateral knee  . Colonoscopy w/ polypectomy    . Breast lumpectomy with needle localization Right 02/06/2013    Procedure: RIGHT BREAST LUMPECTOMY WITH NEEDLE LOCALIZATION;  Surgeon: Harl Bowie, MD;  Location: [facility];  Service: General;  Laterality: Right;  . Hardware removal Left 04/20/2015    Procedure: HARDWARE REMOVAL LEFT LUMBAR FIVE SCREW;  Surgeon: Karie Chimera, MD;  Location: [facility];  Service: Neurosurgery;  Laterality: Left;  . Colonoscopy with propofol N/A 10/27/2015    Procedure: COLONOSCOPY WITH PROPOFOL;  Surgeon: Mauri Pole, MD;  Location: [facility];  Service: Endoscopy;  Laterality: N/A;    Current Outpatient Prescriptions  Medication Sig Dispense Refill  . amitriptyline (ELAVIL) 25 MG tablet Take 25 mg by mouth at bedtime.    [name]  aspirin 81 MG tablet Take 81 mg by mouth daily.    [name] escitalopram (LEXAPRO) 10 MG tablet Take 10 mg by mouth daily.    . furosemide (LASIX) 20 MG tablet Take 20 mg by mouth every other day.     . irbesartan (AVAPRO) 300 MG tablet Take 300 mg by mouth at bedtime.   0  . IRON PO Take 18 mg by mouth daily.    . niacin 500 MG CR capsule Take 500 mg by mouth 2 (two) times daily with a meal.    . Omega-3 Fatty Acids (FISH OIL) 1200 MG CAPS Take 1,200 mg by mouth 2 (two) times daily.    [name] oxyCODONE-acetaminophen (PERCOCET/ROXICET) 5-325 MG tablet Take 1 tablet by mouth every 4 (four) hours as needed for severe pain.    . tamoxifen (NOLVADEX) 20 MG tablet Take 1 tablet by mouth  daily 90 tablet 1`2  . traMADol (ULTRAM) 50 MG tablet take 1 tablet by mouth every 6 hours as needed for pain  0  . verapamil (VERELAN PM) 240 MG 24 hr capsule Take 240 mg by mouth daily.     . Vitamin D, Cholecalciferol, 400 UNITS TABS Take 400 Units by mouth at bedtime.    [name] albuterol (PROVENTIL HFA;VENTOLIN HFA) 108 (90 BASE) MCG/ACT inhaler Inhale 2 puffs into the lungs every 6 (six) hours as needed for wheezing. Reported on  01/20/2016     No current facility-administered medications for this visit.   Facility-Administered Medications Ordered in Other Visits  Medication Dose Route Frequency Provider Last Rate Last Dose  . silver sulfADIAZINE (SILVADENE) 1 % cream   Topical BID Gery Pray, MD   1 application at [id number]    Allergies as of 01/20/2016 - Review Complete 01/20/2016  Allergen Reaction Noted  . Cymbalta [duloxetine hcl] Other (See Comments) 05/27/2012  . Lipitor [atorvastatin] Other (See Comments) 12/22/2014  . Maxzide [hydrochlorothiazide w-triamterene] Other (See Comments) 06/03/2012  . Pravastatin sodium Other (See Comments) 06/03/2012  . Simvastatin Other (See Comments) 04/12/2015  . Lodine [etodolac] Itching 01/30/2011    Vitals: BP 151/75 mmHg  Pulse 72  Ht 5\' 6"  (1.676 m)  Wt 288 lb (130.636 kg)  BMI 46.51 kg/m2 Last Weight:  Wt Readings from Last 1 Encounters:  01/20/16 288 lb (130.636 kg)   Last Height:   Ht Readings from Last 1 Encounters:  01/20/16 5\' 6"  (1.676 m)   Physical exam: Exam: Gen: NAD, conversant, well nourised, obese, well groomed                     CV: RRR, no MRG. No Carotid Bruits. + peripheral edema ankles, warm, nontender Eyes: Conjunctivae clear without exudates or hemorrhage  Neuro: Detailed Neurologic Exam  Speech:    Speech is normal; fluent and spontaneous with normal comprehension.  Cognition:    The patient is oriented to person, place, and time;     recent and remote memory intact;     language fluent;     normal attention, concentration,     fund of knowledge Cranial Nerves:    The pupils are equal, round, and reactive to light. Attempted fundoscopic exam and could not visualize due to small [pupils.  Visual fields are full to finger confrontation. Extraocular movements are intact. Trigeminal sensation is intact and the muscles of mastication are normal. The face is symmetric. The palate elevates in the midline. Hearing intact. Voice  is normal. Shoulder shrug is normal. The tongue has  normal motion without fasciculations.   Coordination:    Normal finger to nose and heel to shin.   Gait:    Heel-toe and tandem gait with imbalance.   Motor Observation:    No asymmetry, no atrophy, and no involuntary movements noted. Tone:    Normal muscle tone.    Posture:    Posture is normal. normal erect    Strength:    Strength is V/V in the upper and lower limbs.      Sensation:Dec pin prick to mid calfs and to the wrists symmetrically. Few seconds vibration distally intact proprioception. Negative romberg.     Reflex Exam:  DTR's:    Absent AJs otherwise deep tendon reflexes in the upper and lower extremities are normal bilaterally.   Toes:    The toes are downgoing bilaterally.   Clonus:    Clonus is absent.       Assessment/Plan:   70 y.o. female here for evaluation of neuropathy. PMHx HTN, HLD, anxiety, OSA, depression, RLS, Type 2 diabetes, LBP, morbid obesity. She has burning in the feet. Neuro exam significant for decreased sensation in a glove and stocking distribution.  - Need Dr. Hal Neer EMG/NCS results from last November and patient side, will request records and review - Burning in the feet likely secondary to diabetes however will perform a complete serum neuropathy panel for any causes that may be treatable. - Patient was started on tricyclic antidepressant and she feels much improved even after just a week. I will not make any changes to medications at this time given her improvement on current recent medication. Can always increase amitriptyline. - Advised weight loss and exercise, follow with PCP for very tight glucose control as this may prevent further worsening of paresthesias in the feet.    Harlene Salts, [facility] [address] [place], [place] [address]  Phone [phone] Fax [phone]

## 2016-01-22 ENCOUNTER — Encounter: Payer: Self-pay | Admitting: Neurology

## 2016-01-24 LAB — MULTIPLE MYELOMA PANEL, SERUM
ALBUMIN/GLOB SERPL: 0.9 (ref 0.7–1.7)
ALPHA2 GLOB SERPL ELPH-MCNC: 0.8 g/dL (ref 0.4–1.0)
Albumin SerPl Elph-Mcnc: 3.5 g/dL (ref 2.9–4.4)
Alpha 1: 0.3 g/dL (ref 0.0–0.4)
B-GLOBULIN SERPL ELPH-MCNC: 1.4 g/dL — AB (ref 0.7–1.3)
Gamma Glob SerPl Elph-Mcnc: 1.5 g/dL (ref 0.4–1.8)
Globulin, Total: 4 g/dL — ABNORMAL HIGH (ref 2.2–3.9)
IGG (IMMUNOGLOBIN G), SERUM: 1479 mg/dL (ref 700–1600)
IGM (IMMUNOGLOBULIN M), SRM: 35 mg/dL (ref 26–217)
IgA/Immunoglobulin A, Serum: 222 mg/dL (ref 64–422)
Total Protein: 7.5 g/dL (ref 6.0–8.5)

## 2016-01-24 LAB — TSH: TSH: 1.57 u[IU]/mL (ref 0.450–4.500)

## 2016-01-24 LAB — B12 AND FOLATE PANEL
Folate: 6.8 ng/mL (ref >3.0)
Vitamin B-12: 220 pg/mL (ref 211–946)

## 2016-01-24 LAB — SJOGREN'S SYNDROME ANTIBODS(SSA + SSB): ENA SSB (LA) Ab: <0.2 AI (ref 0.0–0.9)

## 2016-01-24 LAB — RPR: RPR Ser Ql: NONREACTIVE

## 2016-01-25 ENCOUNTER — Telehealth: Payer: Self-pay | Admitting: *Deleted

## 2016-01-25 NOTE — Telephone Encounter (Signed)
-----   Message from Melvenia Beam, MD sent at 01/24/2016  5:53 PM EDT ----- Patient's B12 is low. B12 deficiency can cause burning in the feet. She should take B12 vitamins orally daily. B12 deficiency can also cause weakness, fatigue, easy bruising or bleeding,sore tongue, stomach upset, and diarrhea or constipation, tingling or numbness to the fingers and toes, difficulty walking, mood changes, depression, memory loss, disorientation and, in severe cases, dementia. Recommend 1000-2000 mcg B12 daily. recheck B12 at next appointment or in 3-6 months to ensure it has increased. Thanks.

## 2016-01-25 NOTE — Telephone Encounter (Signed)
LVM for pt to call about results. Gave [facility] phone number.  

## 2016-01-27 NOTE — Telephone Encounter (Signed)
Pt returned Emma's call °

## 2016-01-27 NOTE — Telephone Encounter (Signed)
Per Dr Jaynee Eagles, spoke with patient and relayed Dr Cathren Laine exact message re: Vit B 12 level being low and all possible side effects of Vit B12 deficiency. Advised she write down instructions, and she stated she did. Gave her instructions to take Vit B 12 supplement [phone] mcg daily. Then informed her Dr Jaynee Eagles will recheck her Vit B 12 level at her next appointment. She verbalized understanding, appreciation.

## 2016-04-12 ENCOUNTER — Other Ambulatory Visit: Payer: Self-pay | Admitting: *Deleted

## 2016-04-12 DIAGNOSIS — C50412 Malignant neoplasm of upper-outer quadrant of left female breast: Secondary | ICD-10-CM

## 2016-04-13 ENCOUNTER — Other Ambulatory Visit: Payer: Medicare Other

## 2016-04-13 ENCOUNTER — Telehealth: Payer: Self-pay | Admitting: Oncology

## 2016-04-13 ENCOUNTER — Ambulatory Visit: Payer: Medicare Other | Admitting: Oncology

## 2016-04-13 NOTE — Telephone Encounter (Signed)
04/13/2016 Appointments rescheduled to 06/01/2016 per patient request. The patient did not realize that she had any appointments for 09/14 and asked to reschedule. Patient available only  on Thursdays and Fridays in the morning.

## 2016-06-01 ENCOUNTER — Other Ambulatory Visit: Payer: Self-pay | Admitting: *Deleted

## 2016-06-01 ENCOUNTER — Other Ambulatory Visit (HOSPITAL_BASED_OUTPATIENT_CLINIC_OR_DEPARTMENT_OTHER): Payer: Medicare Other

## 2016-06-01 ENCOUNTER — Ambulatory Visit (HOSPITAL_BASED_OUTPATIENT_CLINIC_OR_DEPARTMENT_OTHER): Payer: Medicare Other | Admitting: Oncology

## 2016-06-01 VITALS — BP 144/70 | HR 90 | Temp 98.3°F | Resp 18 | Ht 66.0 in | Wt 286.9 lb

## 2016-06-01 DIAGNOSIS — Z79810 Long term (current) use of selective estrogen receptor modulators (SERMs): Secondary | ICD-10-CM

## 2016-06-01 DIAGNOSIS — Z170 Estrogen receptor positive status [ER+]: Secondary | ICD-10-CM | POA: Diagnosis not present

## 2016-06-01 DIAGNOSIS — C50412 Malignant neoplasm of upper-outer quadrant of left female breast: Secondary | ICD-10-CM

## 2016-06-01 LAB — CBC WITH DIFFERENTIAL/PLATELET
BASO%: 0.7 % (ref 0.0–2.0)
Basophils Absolute: 0 10*3/uL (ref 0.0–0.1)
EOS%: 1.6 % (ref 0.0–7.0)
Eosinophils Absolute: 0.1 10*3/uL (ref 0.0–0.5)
HEMATOCRIT: 34.9 % (ref 34.8–46.6)
HEMOGLOBIN: 11.1 g/dL — AB (ref 11.6–15.9)
LYMPH#: 1.8 10*3/uL (ref 0.9–3.3)
LYMPH%: 25.5 % (ref 14.0–49.7)
MCH: 25.8 pg (ref 25.1–34.0)
MCHC: 31.7 g/dL (ref 31.5–36.0)
MCV: 81.2 fL (ref 79.5–101.0)
MONO#: 0.6 10*3/uL (ref 0.1–0.9)
MONO%: 8.2 % (ref 0.0–14.0)
NEUT#: 4.6 10*3/uL (ref 1.5–6.5)
NEUT%: 64 % (ref 38.4–76.8)
Platelets: 248 10*3/uL (ref 145–400)
RBC: 4.29 10*6/uL (ref 3.70–5.45)
RDW: 15.8 % — AB (ref 11.2–14.5)
WBC: 7.2 10*3/uL (ref 3.9–10.3)

## 2016-06-01 LAB — COMPREHENSIVE METABOLIC PANEL
ALBUMIN: 3.2 g/dL — AB (ref 3.5–5.0)
ALT: 12 U/L (ref 0–55)
AST: 11 U/L (ref 5–34)
Alkaline Phosphatase: 69 U/L (ref 40–150)
Anion Gap: 7 mEq/L (ref 3–11)
BUN: 12.5 mg/dL (ref 7.0–26.0)
CALCIUM: 10.1 mg/dL (ref 8.4–10.4)
CHLORIDE: 108 meq/L (ref 98–109)
CO2: 26 mEq/L (ref 22–29)
CREATININE: 1.1 mg/dL (ref 0.6–1.1)
EGFR: 58 mL/min/{1.73_m2} — ABNORMAL LOW (ref >90)
GLUCOSE: 121 mg/dL (ref 70–140)
Potassium: 4 mEq/L (ref 3.5–5.1)
Sodium: 140 mEq/L (ref 136–145)
Total Bilirubin: 0.28 mg/dL (ref 0.20–1.20)
Total Protein: 7.6 g/dL (ref 6.4–8.3)

## 2016-06-01 MED ORDER — KETOCONAZOLE 2 % EX CREA: 1.0000 "application " | TOPICAL_CREAM | Freq: Every day | CUTANEOUS | 0 refills | Status: DC

## 2016-06-01 MED ORDER — TAMOXIFEN CITRATE 20 MG PO TABS: 20.0000 mg | ORAL_TABLET | Freq: Every day | ORAL | 3 refills | Status: DC

## 2016-06-01 MED ORDER — TAMOXIFEN CITRATE 20 MG PO TABS
20.0000 mg | ORAL_TABLET | Freq: Every day | ORAL | Status: DC
Start: 2016-06-01 — End: 2016-06-01

## 2016-06-01 NOTE — Progress Notes (Signed)
ID: [name]   DOB: 1946/03/01  MR#: [id number]  CSN#:[id number]  PCP: Shirline Frees, MD GYN: Gus Height, MD SU: Coralie Keens, MD OTHER MD: Gery Pray, MD; Everlean Cherry  CHIEF COMPLAINT:  Hx of Left Breast Cancer  CURRENT TREATMENT: Tamoxifen  BREAST CANCER HISTORY: From the original intake note:  [name] had routine screening mammography December 2012 showing a possible mass in the left breast. This was confirmed with left diagnostic mammography 07/13/2011, with an ultrasound that day showing a hypoechoic mass measuring 6 mm in the upper outer quadrant of the left breast. Biopsy of this mass 08/02/2011 showed (SAA 13-19) and invasive ductal carcinoma, which was estrogen receptor 100% positive, progesterone receptor 100% positive, with an MIB-1 of 6%, and no HER-2 amplification.  Breast MRI 08/08/2011 showed a solitary 1.2 cm mass in the upper outer quadrant of the left breast, and the patient underwent definitive left lumpectomy 08/30/2011 for a 1.2 cm invasive ductal carcinoma, grade 1, with all 3 sentinel lymph nodes clear. That he was evaluated by Dr. Eston Esters, and after her radiation treatments was completed he tried her on a rim index initially, then letrozole. The patient was unable to tolerate those medications.   The patient's subsequent history is as detailed below  INTERVAL HISTORY: Taiz returns today for follow-up of her estrogen receptor positive breast cancer. She continues on tamoxifen. The biggest problem she has from that medication is hot flashes. She says they happen about every hour. There also wake her up at night. Aside from that she obtains a drug at a good price.  REVIEW OF SYSTEMS: [name] has had back surgery but still has significant back pain. It is not every day, but it is frequent. She has worked with neurosurgery regarding this. She denies unusual headaches, visual changes, nausea, vomiting, cough, phlegm production, or pleurisy. She has had no  change in bowel or bladder habits. Because of the back issues she is unable to exercise. A detailed review of systems today was otherwise stable  PAST MEDICAL HISTORY: Past Medical History:  Diagnosis Date  . Anemia   . Anxiety    takes Xanax daily  . Arthritis   . Blood transfusion   . Breast cancer ([place]) 2013   left breast  . Bronchitis    hx of  . Depression    takes Lexapro daily  . Diabetes mellitus without complication ([place])    boderline  . Diverticulosis   . Headache(784.0)   . History of colon polyps 1998   adenomatous  . Hyperlipidemia    takes Niacin daily  . Hypertension    takes Verapamil and Avapro daily  . OSA (obstructive sleep apnea)   . Pneumonia 4/12   Albuterol daily as needed  . Radiation 10/29/11-11/24/11   left breast 6100 cGy  . Shortness of breath    with exertion  . Spinal stenosis   . Tinnitus     PAST SURGICAL HISTORY: Past Surgical History:  Procedure Laterality Date  . APPENDECTOMY  1978  . BELPHAROPTOSIS REPAIR    . BREAST LUMPECTOMY  08/2011   left  . BREAST LUMPECTOMY WITH NEEDLE LOCALIZATION Right 02/06/2013   Procedure: RIGHT BREAST LUMPECTOMY WITH NEEDLE LOCALIZATION;  Surgeon: Harl Bowie, MD;  Location: [facility];  Service: General;  Laterality: Right;  . COLONOSCOPY W/ POLYPECTOMY    . COLONOSCOPY WITH PROPOFOL N/A 10/27/2015   Procedure: COLONOSCOPY WITH PROPOFOL;  Surgeon: Mauri Pole, MD;  Location: [facility];  Service: Endoscopy;  Laterality: N/A;  . HARDWARE REMOVAL Left 04/20/2015   Procedure: HARDWARE REMOVAL LEFT LUMBAR FIVE SCREW;  Surgeon: Karie Chimera, MD;  Location: [facility];  Service: Neurosurgery;  Laterality: Left;  . JOINT REPLACEMENT Bilateral    bilateral knee  . POLYPECTOMY  1990's  . THYROID CYST EXCISION  1994  . TOTAL KNEE ARTHROPLASTY  2006   bilateral  . TRANSPHENOIDAL / TRANSNASAL HYPOPHYSECTOMY / RESECTION PITUITARY TUMOR  6/11    FAMILY HISTORY Family History  Problem Relation Age of  Onset  . Heart disease Father   . Clotting disorder Father   . Stroke Father   . Stroke Mother   . Diabetes Mother   . Colon cancer Neg Hx   . Neuropathy Neg Hx   . Cancer Sister 41    Breast Cancer  allergies Ms. Heitmeyer is allergic she says to an aunt 2 gabapentin acute looking for the allergies a and I can find The patient's father died at the age of 53 from a ruptured aneurysm (the patient is not sure if this was cerebral or thoracic). The patient's mother died at the age of 66 from a stroke. The patient had one brother and one sister. Her sister has recently been diagnosed with breast cancer at the age of 68. There is no other history of breast or ovarian cancer in the family.  GYNECOLOGIC HISTORY:  (reviewed 12/11/2013) Menarche age 24, first live birth age 20, the patient is GX P2, change of life at age 89. She never took hormone replacement.  SOCIAL HISTORY:  (updated 12/11/2013) She is retired from a clerical work Designer, jewellery. She has been married 69 years. Her husband, [name], owned his own [employer] and is retired. Daughter [name] works for [employer] here in [place]. Son [name] works as a Designer, industrial/product in [employer]. The patient has 3 grandchildren and 1 great granddaughter. She attends a local [place]: Not on file  HEALTH MAINTENANCE:  (updated 12/11/2013) Social History  Substance Use Topics  . Smoking status: Never Smoker  . Smokeless tobacco: Never Used  . Alcohol use No     Colonoscopy: September 2006, normal (to be repeated in 2016) /[facility]   PAP:  January 2015/[facility]  Bone density: Her last bone density scan on 09/28/2011 showed a T score of 2.0 (normal).  Lipid panel: Per Dr. Kenton Kingfisher  Allergies  Allergen Reactions  . Cymbalta [Duloxetine Hcl] Other (See Comments)    Stomach pain  . Lipitor [Atorvastatin] Other (See Comments)    Cramps  . Maxzide [Hydrochlorothiazide W-Triamterene] Other (See Comments)    Cramping   . Pravastatin  Sodium Other (See Comments)    Headache   . Simvastatin Other (See Comments)    Memory loss  . Lodine [Etodolac] Itching    Current Outpatient Prescriptions  Medication Sig Dispense Refill  . albuterol (PROVENTIL HFA;VENTOLIN HFA) 108 (90 BASE) MCG/ACT inhaler Inhale 2 puffs into the lungs every 6 (six) hours as needed for wheezing. Reported on 01/20/2016    . amitriptyline (ELAVIL) 25 MG tablet Take 25 mg by mouth at bedtime.    [name] aspirin 81 MG tablet Take 81 mg by mouth daily.    [name] escitalopram (LEXAPRO) 10 MG tablet Take 10 mg by mouth daily.    . furosemide (LASIX) 20 MG tablet Take 20 mg by mouth every other day.     . irbesartan (AVAPRO) 300 MG tablet Take 300 mg by mouth at bedtime.   0  .  IRON PO Take 18 mg by mouth daily.    . niacin 500 MG CR capsule Take 500 mg by mouth 2 (two) times daily with a meal.    . Omega-3 Fatty Acids (FISH OIL) 1200 MG CAPS Take 1,200 mg by mouth 2 (two) times daily.    [name] oxyCODONE-acetaminophen (PERCOCET/ROXICET) 5-325 MG tablet Take 1 tablet by mouth every 4 (four) hours as needed for severe pain.    . tamoxifen (NOLVADEX) 20 MG tablet Take 1 tablet by mouth  daily 90 tablet 1`2  . traMADol (ULTRAM) 50 MG tablet take 1 tablet by mouth every 6 hours as needed for pain  0  . verapamil (VERELAN PM) 240 MG 24 hr capsule Take 240 mg by mouth daily.     . Vitamin D, Cholecalciferol, 400 UNITS TABS Take 400 Units by mouth at bedtime.     No current facility-administered medications for this visit.    Facility-Administered Medications Ordered in Other Visits  Medication Dose Route Frequency Provider Last Rate Last Dose  . silver sulfADIAZINE (SILVADENE) 1 % cream   Topical BID Gery Pray, MD   1 application at 57/84/69 0934    OBJECTIVE: Morbidly obese African-American woman Vitals:   06/01/16 1157  BP: (!) 144/70  Pulse: 90  Resp: 18  Temp: 98.3 F (36.8 C)    Body mass index is 46.31 kg/m.    ECOG FS: 2 Filed Weights   06/01/16 1157   Weight: 286 lb 14.4 oz (130.1 kg)   Sclerae unicteric, pupils round and equal Oropharynx clear and moist-- no thrush or other lesions No cervical or supraclavicular adenopathy Lungs no rales or rhonchi Heart regular rate and rhythm Abd soft, nontender, positive bowel sounds MSK no focal spinal tenderness, no upper extremity lymphedema Neuro: nonfocal, well oriented, appropriate affect Breasts: The right breast is status post lumpectomy followed by radiation. There is no evidence of local recurrence. The right axilla is benign. The left breast is unremarkable Skin: There is evidence of chronic yeast infections in the inframammary fold bilaterally  LAB RESULTS:   Lab Results  Component Value Date   WBC 7.2 06/01/2016   NEUTROABS 4.6 06/01/2016   HGB 11.1 (L) 06/01/2016   HCT 34.9 06/01/2016   MCV 81.2 06/01/2016   PLT 248 06/01/2016      Chemistry      Component Value Date/Time   NA 144 04/15/2015 1059   K 4.1 04/15/2015 1059   CL 104 12/23/2014 1457   CL 105 12/09/2012 1423   CO2 26 04/15/2015 1059   BUN 12.4 04/15/2015 1059   CREATININE 1.0 04/15/2015 1059      Component Value Date/Time   CALCIUM 10.2 04/15/2015 1059   ALKPHOS 69 04/15/2015 1059   AST 13 04/15/2015 1059   ALT 11 04/15/2015 1059   BILITOT 0.22 04/15/2015 1059       STUDIES: No results found.   ASSESSMENT: 70 y.o. [place] woman   (1)  status post left breast upper outer quadrant  lumpectomy and sentinel lymph node sampling 08/30/2011 for a pT1c pN0, stage IA invasive ductal carcinoma, grade 1, estrogen and progesterone receptor positive, both at 100%, with an MIB-1 of 6%, and no HER-2 amplification.  (2) Oncotype DX score of 3 predicted a distant recurrence within 10 years of 3% if the patient's only adjuvant treatment was tamoxifen for 5 years  (3) Adjuvant! Online would quote her a risk of recurrence of 16% (including in breast recurrences) dropping to 8% with  5 years of an aromatase  inhibitor  (4) Completed adjuvant radiation treatments 11/24/2011  (5)  The patient was unable to take letrozole or anastrozole secondary to concerns regarding jaw pain and constipation.  (5)  Tamoxifen started 10/11/2012   (6) Persistent moderate elevation of the blood calcium level; with elevated  PTH (insurance refused to approve parathyroidectomy).   (7) benign right breast lumpectomy 02/06/2013 for suspicious calcifications  PLAN:  Glenn is now just about 5 years out from definitive surgery for her breast cancer with no evidence of disease recurrence. This is very favorable  She is 3-1/2 years into her 5 years of tamoxifen, with good tolerance except for the hot flashes.  The plan is going to be to continue tamoxifen another year and a half. I'm going to see her shortly before she completes the 5 years, at which time she will be ready to "graduate".  She knows to call for any problems that may develop before her next visit here. He will the trachea Chauncey Cruel, MD 06/01/2016  12:13 PM

## 2016-06-29 ENCOUNTER — Other Ambulatory Visit: Payer: Self-pay | Admitting: Family Medicine

## 2016-06-29 DIAGNOSIS — Z853 Personal history of malignant neoplasm of breast: Secondary | ICD-10-CM

## 2016-07-20 ENCOUNTER — Ambulatory Visit (INDEPENDENT_AMBULATORY_CARE_PROVIDER_SITE_OTHER): Payer: Medicare Other | Admitting: Neurology

## 2016-07-20 ENCOUNTER — Encounter: Payer: Self-pay | Admitting: Neurology

## 2016-07-20 VITALS — BP 166/80 | HR 73 | Ht 66.0 in | Wt 287.8 lb

## 2016-07-20 DIAGNOSIS — E538 Deficiency of other specified B group vitamins: Secondary | ICD-10-CM | POA: Diagnosis not present

## 2016-07-20 NOTE — Progress Notes (Signed)
[facility]    Provider:  Dr Jaynee Eagles Referring Provider: Shirline Frees, MD Primary Care Physician:  Shirline Frees, MD  CC:  neuropathy  Interval history 07/20/2016: Her B12 was low 220 and we asked her to take B12 supplementation. Her risk factors for neuropathy include B12 deficiency, diabetes and morbid obesity. Had a long discussion about this. Amitriptyline is helping and the B12 is helping, do not want to increase however due to side effects in the elderly and weight gain.   HPI:  [name] is a 70 y.o. female here for evaluation of neuropathy. PMHx HTN, HLD, anxiety, OSA, depression, RLS, Type 2 diabetes, LBP, morbid obesity, breast cancer on tamoxifen. She has burning in the feet. The burning in the feet started over a year ago in the setting of diabetes (before her low back surgery) and now with numbness in the feet after surgery.  Numbness started after back surgery last year in May. She has a difficult time exercising due to the burning pain in her feet as well as the numbness and her chronic back pain. She has a lot of back pain , surgery completed by Dr. Hal Neer. She has cramping in the legs and also in the feet. The symptoms are not painful during the day, only at night when laying down when they can be very painful and keep her up at night. She was started on Amitriptyline recently, it is helping her pain in the feet. Lyrica did not help. The Gabapentin helped also in the past but she was taken off to start Lyrica and she can't remember why. Now the numbness is  just in the left foot, burning is in the toes bilaterally, numbness is in the left foot in the toes and in the side of the ankle the outer side and that started right after surgery. No falls. She is off balance especially when she gets up at night. No weakness. No falls. No other focal neurologic deficits or complaints.   Reviewed notes, labs and imaging from outside physicians, which showed: CMP  01/05/2016 normal (creatinine 1.08), HgbA1c 6.8, cbc nml, ldl 162,   TSH 1.15, (2015)  MRI of the lumbar spine was completed in January 2017 (personally reviewed images and agree with the following):  T11-12: Mild disc bulging and minimal facet arthrosis without stenosis, unchanged.  T12-L1: Minimal disc bulging and minimal facet arthrosis without stenosis, unchanged.  L1-2: Moderate facet arthrosis without disc herniation or stenosis, unchanged.  L2-3: Minimal disc bulging, mild ligamentum flavum hypertrophy, and moderate facet arthrosis without significant stenosis, unchanged.  L3-4: Prior laminectomies and fusion. Enhancing epidural postoperative granulation tissue. No stenosis.  L4-5: Prior laminectomies and fusion. Enhancing epidural postoperative granulation tissue. Spinal canal is decompressed. Evaluation of the neural foramina is partially limited by magnetic susceptibility artifact without gross stenosis identified.  L5-S1: Negative.  IMPRESSION: 1. Transitional lumbosacral anatomy as above. 2. Prior posterior decompression at L3-4 and L4-5 without evidence of significant residual stenosis. 3. Mild disc and mild-to-moderate facet degeneration elsewhere without significant stenosis.  Reviewed records from primary care. Patient presented for continued numbness and pain in the feet for over a year. This seemed to get worse after back surgery in May 2016. There is peripheral edema. She had a nerve conduction EMG study by report which showed neuropathy. She had been on gabapentin but it made her sleepy. She was switched to Lyrica which she can only take once a day due to sleepiness. Even once a day at bedtime 8  her dizzy. She did not take the furosemide daily because she gets cramps when she takes it. Patient is seen for diabetes, hypertension, hyperlipidemia, iron deficiency anemia and chronic kidney disease, her A1c was 6.8 and her last GFR was 68. She was diagnosed with  diabetic polyneuropathy, suspicion her pain is diabetic, amitriptyline was started, Lyrica was stopped, primary care did not receive the actual nerve conduction velocities EMG from Dr. Hal Neer also did not have had to send to [name].   Review of Systems: Patient complains of symptoms per HPI as well as the following symptoms: fatigue, blurred vision, shortness of breath, feelin gof cold, hearing loss, joint swelling, itching, numbness, dizziness, anxiety, not enough sleep, decreased energy. Pertinent negatives per HPI. All others negative.   Social History   Social History  . Marital status: Married    Spouse name: Shanon Brow  . Number of children: 2  . Years of education: 12   Occupational History  . Retired Designer, fashion/clothing    Social History Main Topics  . Smoking status: Never Smoker  . Smokeless tobacco: Never Used  . Alcohol use No  . Drug use: No  . Sexual activity: No   Other Topics Concern  . Not on file   Social History Narrative   Lives with husband   Caffeine use: 2-3 servings per day    Family History  Problem Relation Age of Onset  . Heart disease Father   . Clotting disorder Father   . Stroke Father   . Stroke Mother   . Diabetes Mother   . Colon cancer Neg Hx   . Neuropathy Neg Hx   . Cancer Sister 34    Breast Cancer    Past Medical History:  Diagnosis Date  . Anemia   . Anxiety    takes Xanax daily  . Arthritis   . Blood transfusion   . Breast cancer ([place]) 2013   left breast  . Bronchitis    hx of  . Depression    takes Lexapro daily  . Diabetes mellitus without complication ([place])    boderline  . Diverticulosis   . Headache(784.0)   . History of colon polyps 1998   adenomatous  . Hyperlipidemia    takes Niacin daily  . Hypertension    takes Verapamil and Avapro daily  . OSA (obstructive sleep apnea)   . Pneumonia 4/12   Albuterol daily as needed  . Radiation 10/29/11-11/24/11   left breast 6100 cGy  . Shortness of breath    with  exertion  . Spinal stenosis   . Tinnitus     Past Surgical History:  Procedure Laterality Date  . APPENDECTOMY  1978  . BELPHAROPTOSIS REPAIR    . BREAST LUMPECTOMY  08/2011   left  . BREAST LUMPECTOMY WITH NEEDLE LOCALIZATION Right 02/06/2013   Procedure: RIGHT BREAST LUMPECTOMY WITH NEEDLE LOCALIZATION;  Surgeon: Harl Bowie, MD;  Location: [facility];  Service: General;  Laterality: Right;  . COLONOSCOPY W/ POLYPECTOMY    . COLONOSCOPY WITH PROPOFOL N/A 10/27/2015   Procedure: COLONOSCOPY WITH PROPOFOL;  Surgeon: Mauri Pole, MD;  Location: [facility];  Service: Endoscopy;  Laterality: N/A;  . HARDWARE REMOVAL Left 04/20/2015   Procedure: HARDWARE REMOVAL LEFT LUMBAR FIVE SCREW;  Surgeon: Karie Chimera, MD;  Location: [facility];  Service: Neurosurgery;  Laterality: Left;  . JOINT REPLACEMENT Bilateral    bilateral knee  . POLYPECTOMY  1990's  . THYROID CYST EXCISION  1994  . TOTAL  KNEE ARTHROPLASTY  2006   bilateral  . TRANSPHENOIDAL / TRANSNASAL HYPOPHYSECTOMY / RESECTION PITUITARY TUMOR  6/11    Current Outpatient Prescriptions  Medication Sig Dispense Refill  . vitamin B-12 (CYANOCOBALAMIN) 1000 MCG tablet Take 1,000 mcg by mouth daily.    [name] amitriptyline (ELAVIL) 25 MG tablet Take 25 mg by mouth at bedtime.    [name] aspirin 81 MG tablet Take 81 mg by mouth daily.    [name] escitalopram (LEXAPRO) 10 MG tablet Take 10 mg by mouth daily.    . irbesartan (AVAPRO) 300 MG tablet Take 300 mg by mouth at bedtime.   0  . IRON PO Take 18 mg by mouth daily.    [name] ketoconazole (NIZORAL) 2 % cream Apply 1 application topically daily. 15 g 0  . niacin 500 MG CR capsule Take 500 mg by mouth 2 (two) times daily with a meal.    . Omega-3 Fatty Acids (FISH OIL) 1200 MG CAPS Take 1,200 mg by mouth 2 (two) times daily.    . tamoxifen (NOLVADEX) 20 MG tablet Take 1 tablet (20 mg total) by mouth daily. 90 tablet 3  . traMADol (ULTRAM) 50 MG tablet take 1 tablet by mouth every 6 hours as needed for  pain  0  . verapamil (VERELAN PM) 240 MG 24 hr capsule Take 240 mg by mouth daily.     . Vitamin D, Cholecalciferol, 400 UNITS TABS Take 400 Units by mouth at bedtime.     No current facility-administered medications for this visit.     Allergies as of 07/20/2016 - Review Complete 06/01/2016  Allergen Reaction Noted  . Cymbalta [duloxetine hcl] Other (See Comments) 05/27/2012  . Gabapentin  06/01/2016  . Lipitor [atorvastatin] Other (See Comments) 12/22/2014  . Maxzide [hydrochlorothiazide w-triamterene] Other (See Comments) 06/03/2012  . Pravastatin sodium Other (See Comments) 06/03/2012  . Simvastatin Other (See Comments) 04/12/2015  . Lodine [etodolac] Itching 01/30/2011    Vitals: There were no vitals taken for this visit. Last Weight:  Wt Readings from Last 1 Encounters:  06/01/16 286 lb 14.4 oz (130.1 kg)   Last Height:   Ht Readings from Last 1 Encounters:  06/01/16 5\' 6"  (1.676 m)  Physical exam: Exam: Gen: NAD, conversant, well nourised, obese, well groomed                     CV: RRR, no MRG. No Carotid Bruits. + peripheral edema ankles, warm, nontender Eyes: Conjunctivae clear without exudates or hemorrhage  Neuro: Detailed Neurologic Exam  Speech:    Speech is normal; fluent and spontaneous with normal comprehension.  Cognition:    The patient is oriented to person, place, and time;     recent and remote memory intact;     language fluent;     normal attention, concentration,     fund of knowledge Cranial Nerves:    The pupils are equal, round, and reactive to light. Attempted fundoscopic exam and could not visualize due to small [pupils.  Visual fields are full to finger confrontation. Extraocular movements are intact. Trigeminal sensation is intact and the muscles of mastication are normal. The face is symmetric. The palate elevates in the midline. Hearing intact. Voice is normal. Shoulder shrug is normal. The tongue has normal motion without  fasciculations.   Coordination:    Normal finger to nose and heel to shin.   Gait:    Heel-toe and tandem gait with imbalance.   Motor Observation:  No asymmetry, no atrophy, and no involuntary movements noted. Tone:    Normal muscle tone.    Posture:    Posture is normal. normal erect    Strength:    Strength is V/V in the upper and lower limbs.      Sensation:Dec pin prick to mid calfs and to the wrists symmetrically. Few seconds vibration distally intact proprioception. Negative romberg.     Reflex Exam:  DTR's:    Absent AJs otherwise deep tendon reflexes in the upper and lower extremities are normal bilaterally.   Toes:    The toes are downgoing bilaterally.   Clonus:    Clonus is absent.       Assessment/Plan:   70 y.o. female here for evaluation of neuropathy. PMHx HTN, HLD, anxiety, OSA on cpap, depression, RLS, Type 2 diabetes, LBP, morbid obesity, breast cancer. She has burning in the feet. Neuro exam significant for decreased sensation in a glove and stocking distribution likely diabetic and B12 neuropathy (B12 was low at 220, started on oral supplementation. She is feeling better on the B12 oral supplementation and the amitriptyline.   - Burning in the feet likely secondary to diabetes, obesity and B12 deficiency.  - Patient was started on tricyclic antidepressant by pcp and she feels much improved. Amitriptyline is helping and the B12 is helping, do not want to increase Amitriptyline however due to side effects in the elderly and weight gain. - Advised weight loss and exercise, follow with PCP for very tight glucose control as this may prevent further worsening of paresthesias in the feet. Obesity has been implicated in neuropathy as well.  - Discussed the new [facility], recommended calling in Jan/Feb when they start taking patients.[phone] - She has joint pain in the jaw, advised f/u with pcp for TMJ  B12 was low  at 220 and she is taking oral supplementation. Will recheck today.   Harlene Salts, MD  [facility] [address] [place], [place] [address]  Phone [phone] Fax [phone]  A total of 30 minutes was spent face-to-face with this patient. Over half this time was spent on counseling patient on the distal peripheral neuropathy diagnosis and different diagnostic and therapeutic options available.

## 2016-07-20 NOTE — Patient Instructions (Addendum)
Remember to drink plenty of fluid, eat healthy meals and do not skip any meals. Try to eat protein with a every meal and eat a healthy snack such as fruit or nuts in between meals. Try to keep a regular sleep-wake schedule and try to exercise daily, particularly in the form of walking, 20-30 minutes a day, if you can.   As far as your medications are concerned, I would like to suggest: Continue Amitriptyline,   Discussed the new [facility], recommended calling in Jan/Feb when they start taking patients.[phone]  I would like to see you back in 6 months, sooner if we need to. Please call us with any interim questions, concerns, problems, updates or refill requests.   Our phone number is [phone]. We also have an after hours call service for urgent matters and there is a physician on-call for urgent questions. For any emergencies you know to call 911 or go to the nearest emergency room  Temporomandibular Joint Syndrome Temporomandibular joint (TMJ) syndrome is a condition that affects the joints between your jaw and your skull. The TMJs are located near your ears and allow your jaw to open and close. These joints and the nearby muscles are involved in all movements of the jaw. People with TMJ syndrome have pain in the area of these joints and muscles. Chewing, biting, or other movements of the jaw can be difficult or painful. TMJ syndrome can be caused by various things. In many cases, the condition is mild and goes away within a few weeks. For some people, the condition can become a long-term problem. What are the causes? Possible causes of TMJ syndrome include:  Grinding your teeth or clenching your jaw. Some people do this when they are under stress.  Arthritis.  Injury to the jaw.  Head or neck injury.  Teeth or dentures that are not aligned well. In some cases, the cause of TMJ syndrome may not be known. What are the signs or symptoms? The most  common symptom is an aching pain on the side of the head in the area of the TMJ. Other symptoms may include:  Pain when moving your jaw, such as when chewing or biting.  Being unable to open your jaw all the way.  Making a clicking sound when you open your mouth.  Headache.  Earache.  Neck or shoulder pain. How is this diagnosed? Diagnosis can usually be made based on your symptoms, your medical history, and a physical exam. Your health care provider may check the range of motion of your jaw. Imaging tests, such as X-rays or an MRI, are sometimes done. You may need to see your dentist to determine if your teeth and jaw are lined up correctly. How is this treated? TMJ syndrome often goes away on its own. If treatment is needed, the options may include:  Eating soft foods and applying ice or heat.  Medicines to relieve pain or inflammation.  Medicines to relax the muscles.  A splint, bite plate, or mouthpiece to prevent teeth grinding or jaw clenching.  Relaxation techniques or counseling to help reduce stress.  Transcutaneous electrical nerve stimulation (TENS). This helps to relieve pain by applying an electrical current through the skin.  Acupuncture. This is sometimes helpful to relieve pain.  Jaw surgery. This is rarely needed. Follow these instructions at home:  Take medicines only as directed by your health care provider.  Eat a soft diet if you are having trouble chewing.  Apply  ice to the painful area.  Put ice in a plastic bag.  Place a towel between your skin and the bag.  Leave the ice on for 20 minutes, 2-3 times a day.  Apply a warm compress to the painful area as directed.  Massage your jaw area and perform any jaw stretching exercises as recommended by your health care provider.  If you were given a mouthpiece or bite plate, wear it as directed.  Avoid foods that require a lot of chewing. Do not chew gum.  Keep all follow-up visits as directed by  your health care provider. This is important. Contact a health care provider if:  You are having trouble eating.  You have new or worsening symptoms. Get help right away if:  Your jaw locks open or closed. This information is not intended to replace advice given to you by your health care provider. Make sure you discuss any questions you have with your health care provider. Document Released: 04/11/2001 Document Revised: 03/16/2016 Document Reviewed: 02/19/2014 Elsevier Interactive Patient Education  2017 [employer].   Jaw Range of Motion Exercises Introduction Jaw range of motion exercises are exercises that help your jaw to move better. These exercises can help to prevent:  Difficulty opening your mouth.  Pain in your jaw while it is both open and closed. What should I be careful of when doing jaw exercises? Make sure that you only do jaw exercises as directed by your health care provider. You should only move your jaw as far as it can go in each direction, if told to do so by your health care provider. Do not move your jaw into positions that cause you any pain. What exercises should I do?  Stick your jaw forward. Hold this position for 1-2 seconds. Allow your jaw to return to its normal position and rest it there for 1-2 seconds. Do this exercise 8 times.  Stand or sit in front of a mirror. Place your tongue on the roof of your mouth, just behind your top teeth. Slowly open and close your jaw, keeping your tongue on the roof of your mouth. While you open and close your mouth, try to keep your jaw from moving toward one side or the other. Repeat this 8 times.  Move your jaw right. Hold this position for 1-2 seconds. Allow your jaw to return to its normal position, and rest it there for 1-2 seconds. Do this exercise 8 times.  Move your jaw left. Hold this position for 1-2 seconds. Allow your jaw to return to its normal position, and rest it there for 1-2 seconds. Do this exercise  8 times.  Open your mouth as far as it is can comfortably go. Hold this position for 1-2 seconds. Then close your mouth and rest for 1-2 seconds. Do this exercise 8 times.  Move your jaw in a circular motion, starting toward the right (clockwise). Repeat this 8 times.  Move your jaw in a circular motion, starting toward the left (counterclockwise). Repeat this 8 times. Apply moist heat packs or ice packs to your jaw before or after performing your exercises as directed by your health care provider. What else can I do? Avoid the following, if they cause jaw pain or they increase your jaw pain:  Chewing gum.  Clenching your jaw or teeth or keeping tension in your jaw muscles.  Leaning on your jaw, such as resting your jaw in your hand while leaning on a desk. This information is not  intended to replace advice given to you by your health care provider. Make sure you discuss any questions you have with your health care provider. Document Released: 06/29/2008 Document Revised: 12/23/2015 Document Reviewed: 06/17/2014  2017 Elsevier

## 2016-07-21 LAB — VITAMIN B12: VITAMIN B 12: 1016 pg/mL (ref 232–1245)

## 2016-07-26 ENCOUNTER — Telehealth: Payer: Self-pay | Admitting: *Deleted

## 2016-07-26 NOTE — Telephone Encounter (Signed)
LVM for patient about lab results per [facility],MD note. Gave [facility] phone number if she has further questions/concerns.

## 2016-07-26 NOTE — Telephone Encounter (Signed)
-----   Message from Melvenia Beam, MD sent at 07/21/2016  9:28 AM EST ----- Vitamin B12 looks great, continue to take supplements long term thanks

## 2016-07-31 HISTORY — PX: CATARACT EXTRACTION W/ INTRAOCULAR LENS IMPLANT: SHX1309

## 2016-08-03 ENCOUNTER — Ambulatory Visit
Admission: RE | Admit: 2016-08-03 | Discharge: 2016-08-03 | Disposition: A | Discharge status: Home / Self Care | Payer: Medicare Other | Source: Ambulatory Visit | Attending: Family Medicine | Admitting: Family Medicine

## 2016-08-03 DIAGNOSIS — Z853 Personal history of malignant neoplasm of breast: Secondary | ICD-10-CM

## 2016-08-19 ENCOUNTER — Encounter (INDEPENDENT_AMBULATORY_CARE_PROVIDER_SITE_OTHER): Payer: Medicare Other | Admitting: Family Medicine

## 2016-08-21 ENCOUNTER — Emergency Department (HOSPITAL_BASED_OUTPATIENT_CLINIC_OR_DEPARTMENT_OTHER)
Admission: EM | Admit: 2016-08-21 | Discharge: 2016-08-21 | Disposition: A | Discharge status: Home / Self Care | Payer: Medicare Other | Attending: Emergency Medicine | Admitting: Emergency Medicine

## 2016-08-21 ENCOUNTER — Encounter (HOSPITAL_BASED_OUTPATIENT_CLINIC_OR_DEPARTMENT_OTHER): Payer: Self-pay | Admitting: Emergency Medicine

## 2016-08-21 ENCOUNTER — Emergency Department (HOSPITAL_BASED_OUTPATIENT_CLINIC_OR_DEPARTMENT_OTHER): Payer: Medicare Other

## 2016-08-21 DIAGNOSIS — R109 Unspecified abdominal pain: Secondary | ICD-10-CM | POA: Diagnosis present

## 2016-08-21 DIAGNOSIS — E119 Type 2 diabetes mellitus without complications: Secondary | ICD-10-CM | POA: Insufficient documentation

## 2016-08-21 DIAGNOSIS — Z853 Personal history of malignant neoplasm of breast: Secondary | ICD-10-CM | POA: Insufficient documentation

## 2016-08-21 DIAGNOSIS — Z7982 Long term (current) use of aspirin: Secondary | ICD-10-CM | POA: Insufficient documentation

## 2016-08-21 DIAGNOSIS — N200 Calculus of kidney: Secondary | ICD-10-CM | POA: Diagnosis not present

## 2016-08-21 DIAGNOSIS — I10 Essential (primary) hypertension: Secondary | ICD-10-CM | POA: Diagnosis not present

## 2016-08-21 DIAGNOSIS — Z79899 Other long term (current) drug therapy: Secondary | ICD-10-CM | POA: Insufficient documentation

## 2016-08-21 LAB — BASIC METABOLIC PANEL
Anion gap: 8 (ref 5–15)
BUN: 13 mg/dL (ref 6–20)
CO2: 24 mmol/L (ref 22–32)
CREATININE: 1.54 mg/dL — AB (ref 0.44–1.00)
Calcium: 9.9 mg/dL (ref 8.9–10.3)
Chloride: 106 mmol/L (ref 101–111)
GFR calc Af Amer: 38 mL/min — ABNORMAL LOW (ref >60)
GFR, EST NON AFRICAN AMERICAN: 33 mL/min — AB (ref >60)
GLUCOSE: 139 mg/dL — AB (ref 65–99)
POTASSIUM: 4.1 mmol/L (ref 3.5–5.1)
Sodium: 138 mmol/L (ref 135–145)

## 2016-08-21 LAB — CBC
HEMATOCRIT: 35.6 % — AB (ref 36.0–46.0)
Hemoglobin: 11.8 g/dL — ABNORMAL LOW (ref 12.0–15.0)
MCH: 26.5 pg (ref 26.0–34.0)
MCHC: 33.1 g/dL (ref 30.0–36.0)
MCV: 79.8 fL (ref 78.0–100.0)
PLATELETS: 267 10*3/uL (ref 150–400)
RBC: 4.46 MIL/uL (ref 3.87–5.11)
RDW: 15.1 % (ref 11.5–15.5)
WBC: 14.5 10*3/uL — ABNORMAL HIGH (ref 4.0–10.5)

## 2016-08-21 LAB — URINALYSIS, ROUTINE W REFLEX MICROSCOPIC
BILIRUBIN URINE: NEGATIVE
GLUCOSE, UA: NEGATIVE mg/dL
KETONES UR: NEGATIVE mg/dL
Leukocytes, UA: NEGATIVE
Nitrite: NEGATIVE
PROTEIN: NEGATIVE mg/dL
Specific Gravity, Urine: 1.011 (ref 1.005–1.030)
pH: 6.5 (ref 5.0–8.0)

## 2016-08-21 LAB — URINALYSIS, MICROSCOPIC (REFLEX): WBC UA: NONE SEEN WBC/hpf (ref 0–5)

## 2016-08-21 MED ORDER — ONDANSETRON HCL 4 MG PO TABS: 4.0000 mg | ORAL_TABLET | Freq: Four times a day (QID) | ORAL | 0 refills | Status: DC

## 2016-08-21 MED ORDER — SODIUM CHLORIDE 0.9 % IV BOLUS (SEPSIS)
500.0000 mL | Freq: Once | INTRAVENOUS | Status: AC
  Administered 2016-08-21: 500 mL via INTRAVENOUS

## 2016-08-21 MED ORDER — HYDROCODONE-ACETAMINOPHEN 5-325 MG PO TABS
1.0000 | ORAL_TABLET | Freq: Once | ORAL | Status: AC
  Administered 2016-08-21: 1 via ORAL
  Filled 2016-08-21: qty 1

## 2016-08-21 MED ORDER — ONDANSETRON HCL 4 MG/2ML IJ SOLN
4.0000 mg | Freq: Once | INTRAMUSCULAR | Status: AC
  Administered 2016-08-21: 4 mg via INTRAVENOUS
  Filled 2016-08-21: qty 2

## 2016-08-21 MED ORDER — MORPHINE SULFATE (PF) 4 MG/ML IV SOLN
4.0000 mg | Freq: Once | INTRAVENOUS | Status: AC
  Administered 2016-08-21: 4 mg via INTRAVENOUS
  Filled 2016-08-21: qty 1

## 2016-08-21 MED ORDER — HYDROCODONE-ACETAMINOPHEN 5-325 MG PO TABS: 1.0000 | ORAL_TABLET | Freq: Four times a day (QID) | ORAL | 0 refills | Status: DC | PRN

## 2016-08-21 NOTE — ED Provider Notes (Signed)
[facility] Provider Note   CSN: [id number] Arrival date & time: 08/21/16  1816  By signing my name below, I, [name], attest that this documentation has been prepared under the direction and in the presence of Blanchie Dessert, MD. Electronically signed, [name], ED Scribe. 08/21/16. 9:26 PM.  History   Chief Complaint Chief Complaint  Patient presents with  . Flank Pain  . Hematuria    HPI HPI Comments: [name] is a 71 y.o. female, with Hx of DM, who presents to the Emergency Department complaining of severe, intermittent, right sided flank pain that started yesterday. Pt had one episode of pain, nausea and vomiting last night and felt slightly better afterwards. She also notes associated hematuria. Pt felt fine this morning, she returned home around noon and 30 minutes later had another sudden onset of pain. She states that the pain has moved towards the right side of abdomen. She has had one episode of vomiting since arriving to the ED. No fever. No urinary complaints.  The history is provided by the patient. No language interpreter was used.    Past Medical History:  Diagnosis Date  . Anemia   . Anxiety    takes Xanax daily  . Arthritis   . Blood transfusion   . Breast cancer ([place]) 2013   left breast  . Bronchitis    hx of  . Depression    takes Lexapro daily  . Diabetes mellitus without complication ([place])    boderline  . Diverticulosis   . Headache(784.0)   . History of colon polyps 1998   adenomatous  . Hyperlipidemia    takes Niacin daily  . Hypertension    takes Verapamil and Avapro daily  . OSA (obstructive sleep apnea)   . Pneumonia 4/12   Albuterol daily as needed  . Radiation 10/29/11-11/24/11   left breast 6100 cGy  . Shortness of breath    with exertion  . Spinal stenosis   . Tinnitus     Patient Active Problem List   Diagnosis Date Noted  . B12 deficiency 07/20/2016  . Lumbar radiculitis 04/20/2015  .  Spondylolisthesis, acquired 12/29/2014  . Spinal stenosis 08/13/2014  . Pituitary macroadenoma ([place]) 08/13/2014  . Hot flashes due to tamoxifen 12/11/2013  . Postmenopausal estrogen deficiency 12/11/2013  . Breast cancer of upper-outer quadrant of left female breast ([place]) 06/12/2013  . Dyspnea 02/11/2013  . Lactic acidosis 02/11/2013  . Acute bronchitis 02/11/2013  . Obesity 02/11/2013  . Breast calcifications on mammogram 01/29/2013  . Hyperparathyroidism, primary ([place]) 01/29/2013  . OSA (obstructive sleep apnea) 01/30/2011  . Hyperlipidemia 09/20/2007  . Essential hypertension 09/20/2007    Past Surgical History:  Procedure Laterality Date  . APPENDECTOMY  1978  . BACK SURGERY    . BELPHAROPTOSIS REPAIR    . BREAST LUMPECTOMY  08/2011   left  . BREAST LUMPECTOMY WITH NEEDLE LOCALIZATION Right 02/06/2013   Procedure: RIGHT BREAST LUMPECTOMY WITH NEEDLE LOCALIZATION;  Surgeon: Harl Bowie, MD;  Location: [facility];  Service: General;  Laterality: Right;  . COLONOSCOPY W/ POLYPECTOMY    . COLONOSCOPY WITH PROPOFOL N/A 10/27/2015   Procedure: COLONOSCOPY WITH PROPOFOL;  Surgeon: Mauri Pole, MD;  Location: [facility];  Service: Endoscopy;  Laterality: N/A;  . HARDWARE REMOVAL Left 04/20/2015   Procedure: HARDWARE REMOVAL LEFT LUMBAR FIVE SCREW;  Surgeon: Karie Chimera, MD;  Location: [facility];  Service: Neurosurgery;  Laterality: Left;  . JOINT REPLACEMENT Bilateral    bilateral  knee  . POLYPECTOMY  1990's  . THYROID CYST EXCISION  1994  . TOTAL KNEE ARTHROPLASTY  2006   bilateral  . TRANSPHENOIDAL / TRANSNASAL HYPOPHYSECTOMY / RESECTION PITUITARY TUMOR  6/11    OB History    No data available       Home Medications    Prior to Admission medications   Medication Sig Start Date End Date Taking? Authorizing Provider  amitriptyline (ELAVIL) 25 MG tablet Take 25 mg by mouth at bedtime.    Historical Provider, MD  aspirin 81 MG tablet Take 81 mg by mouth daily.     Historical Provider, MD  escitalopram (LEXAPRO) 10 MG tablet Take 10 mg by mouth daily.    Historical Provider, MD  irbesartan (AVAPRO) 300 MG tablet Take 300 mg by mouth at bedtime.  12/09/14   Historical Provider, MD  IRON PO Take 18 mg by mouth daily.    Historical Provider, MD  ketoconazole (NIZORAL) 2 % cream Apply 1 application topically daily. 06/01/16   Chauncey Cruel, MD  niacin 500 MG CR capsule Take 500 mg by mouth 2 (two) times daily with a meal.    Historical Provider, MD  Omega-3 Fatty Acids (FISH OIL) 1200 MG CAPS Take 1,200 mg by mouth 2 (two) times daily.    Historical Provider, MD  tamoxifen (NOLVADEX) 20 MG tablet Take 1 tablet (20 mg total) by mouth daily. 06/01/16   Chauncey Cruel, MD  traMADol Veatrice Bourbon) 50 MG tablet take 1 tablet by mouth every 6 hours as needed for pain 08/03/15   Historical Provider, MD  verapamil (VERELAN PM) 240 MG 24 hr capsule Take 240 mg by mouth daily.     Historical Provider, MD  vitamin B-12 (CYANOCOBALAMIN) 1000 MCG tablet Take 1,000 mcg by mouth daily.    Historical Provider, MD  Vitamin D, Cholecalciferol, 400 UNITS TABS Take 400 Units by mouth at bedtime.    Historical Provider, MD    Family History Family History  Problem Relation Age of Onset  . Heart disease Father   . Clotting disorder Father   . Stroke Father   . Stroke Mother   . Diabetes Mother   . Cancer Sister 57    Breast Cancer  . Colon cancer Neg Hx   . Neuropathy Neg Hx     Social History Social History  Substance Use Topics  . Smoking status: Never Smoker  . Smokeless tobacco: Never Used  . Alcohol use No    Allergies   Cymbalta [duloxetine hcl]; Gabapentin; Lipitor [atorvastatin]; Maxzide [hydrochlorothiazide w-triamterene]; Pravastatin sodium; Simvastatin; and Lodine [etodolac]   Review of Systems Review of Systems  Constitutional: Negative for fever.  Gastrointestinal: Positive for nausea and vomiting.  Genitourinary: Positive for flank pain (right).    All other systems reviewed and are negative.    Physical Exam Updated Vital Signs BP (!) 195/109   Pulse 85   Temp 99 F (37.2 C) (Oral)   Resp 22   Ht 5\' 6"  (1.676 m)   Wt 285 lb (129.3 kg)   SpO2 100%   BMI 46.00 kg/m   Physical Exam  Constitutional: [name], place, and time. She appears well-developed and well-nourished.  HENT:  Head: Normocephalic and atraumatic.  Eyes: Conjunctivae are normal.  Neck: Neck supple.  Cardiovascular: Normal rate and regular rhythm.   Pulmonary/Chest: Effort normal and breath sounds normal.  Abdominal: Soft. Bowel sounds are normal.  Musculoskeletal: Normal range of motion. She exhibits  no tenderness.  No flank tenderness. No reproducible abdominal tenderness.  Neurological: She is alert and oriented to person, place, and time.  Skin: Skin is warm and dry.  Psychiatric: She has a normal mood and affect. Her behavior is normal.  Nursing note and vitals reviewed.    ED Treatments / Results  DIAGNOSTIC STUDIES: Oxygen Saturation is 100% on RA, normal by my interpretation.  COORDINATION OF CARE: 9:25 PM-Discussed treatment plan with pt at bedside and pt agreed to plan.   Labs (all labs ordered are listed, but only abnormal results are displayed) Labs Reviewed  URINALYSIS, ROUTINE W REFLEX MICROSCOPIC - Abnormal; Notable for the following:       Result Value   Hgb urine dipstick LARGE (*)    All other components within normal limits  BASIC METABOLIC PANEL - Abnormal; Notable for the following:    Glucose, Bld 139 (*)    Creatinine, Ser 1.54 (*)    GFR calc non Af Amer 33 (*)    GFR calc Af Amer 38 (*)    All other components within normal limits  CBC - Abnormal; Notable for the following:    WBC 14.5 (*)    Hemoglobin 11.8 (*)    HCT 35.6 (*)    All other components within normal limits  URINALYSIS, MICROSCOPIC (REFLEX) - Abnormal; Notable for the following:    Bacteria, UA RARE (*)    Squamous Epithelial /  LPF 0-5 (*)    All other components within normal limits  URINE CULTURE    EKG  EKG Interpretation None       Radiology Ct Renal Stone Study  Result Date: 08/21/2016 CLINICAL DATA:  Acute onset of right flank pain.  Initial encounter. EXAM: CT ABDOMEN AND PELVIS WITHOUT CONTRAST TECHNIQUE: Multidetector CT imaging of the abdomen and pelvis was performed following the standard protocol without IV contrast. COMPARISON:  MRI of the lumbar spine performed 08/23/2015 FINDINGS: Lower chest: The visualized lung bases are grossly clear. The visualized portions of the mediastinum are unremarkable. Hepatobiliary: Stones are seen within the gallbladder. The gallbladder is otherwise unremarkable. The liver is unremarkable in appearance. The common bile duct remains normal in caliber. Pancreas: The pancreas is within normal limits. Spleen: The spleen is unremarkable in appearance. Adrenals/Urinary Tract: The adrenal glands are unremarkable in appearance) Mild-to-moderate right-sided hydronephrosis is seen, with obstructing 4 mm stone noted at the distal right ureter, 5 cm above the right vesicoureteral junction. Right-sided perinephric stranding and fluid are seen. A nonobstructing 3 mm stone is noted at the lower pole of the right kidney. The left kidney is grossly unremarkable in appearance. Stomach/Bowel: The stomach is unremarkable in appearance. The small bowel is within normal limits. There is mild extension of a small bowel loop into an anterior abdominal wall bulge at the umbilicus, without significant herniation. The patient is status post appendectomy. Scattered diverticulosis is noted along the distal transverse, descending and sigmoid colon, without evidence of diverticulitis. The colon is otherwise unremarkable in appearance. Vascular/Lymphatic: Scattered calcification is seen along the abdominal aorta and its branches. The abdominal aorta is otherwise grossly unremarkable. The inferior vena cava is  grossly unremarkable. No retroperitoneal lymphadenopathy is seen. No pelvic sidewall lymphadenopathy is identified. Reproductive: The bladder is mildly distended and grossly unremarkable. The uterus is unremarkable in appearance. The ovaries are relatively symmetric. No suspicious adnexal masses are seen. Other: No additional soft tissue abnormalities are seen. Musculoskeletal: No acute osseous abnormalities are identified. The patient is status post  lumbar spinal fusion and decompression at L4-S1, with underlying mild chronic degenerative change. The visualized musculature is unremarkable in appearance. IMPRESSION: 1. Mild-to-moderate right-sided hydronephrosis, with an obstructing 4 mm stone in the distal right ureter, 5 cm above the right vesicoureteral junction. 2. Nonobstructing 3 mm stone at the lower pole of the right kidney. 3. Scattered diverticulosis along the distal transverse, descending and sigmoid colon, without evidence of diverticulitis. 4. Scattered aortic atherosclerosis. 5. Cholelithiasis. Gallbladder otherwise unremarkable in appearance. Electronically Signed   By: Garald Balding M.D.   On: 08/21/2016 22:18    Procedures Procedures (including critical care time)  Medications Ordered in ED Medications - No data to display   Initial Impression / Assessment and Plan / ED Course  I have reviewed the triage vital signs and the nursing notes.  Pertinent labs & imaging results that were available during my care of the patient were reviewed by me and considered in my medical decision making (see chart for details).     Pt with symptoms consistent with kidney stone.  Denies infectious sx, or GI symptoms.  Low concern for diverticulitis and no history suggestive of AAA.  No hx suggestive of GU source (discharge).  Will hydrate, treat pain and ensure no infection with UA, CBC, CMP and will get stone study to further eval.  11:21 PM UA with large hb and BMP with mild AKI with Cr of 1.5  and mild leukocytosis of 14.5.  CT with 87mm right sided stone and hydronephrosis.  Pt sx improved with morphine and sent home with pain control.   Final Clinical Impressions(s) / ED Diagnoses   Final diagnoses:  Kidney stone    New Prescriptions New Prescriptions   HYDROCODONE-ACETAMINOPHEN (NORCO/VICODIN) 5-325 MG TABLET    Take 1-2 tablets by mouth every 6 (six) hours as needed.   ONDANSETRON (ZOFRAN) 4 MG TABLET    Take 1 tablet (4 mg total) by mouth every 6 (six) hours.   I personally performed the services described in this documentation, which was scribed in my presence.  The recorded information has been reviewed and considered.    Blanchie Dessert, MD 08/21/16 [id number]

## 2016-08-21 NOTE — ED Triage Notes (Signed)
Pt sent from [facility] with right flank pain that began today. Pt reports hematuria and nausea. Denies hx of kidney stone or GI hx.

## 2016-08-23 LAB — URINE CULTURE: Culture: <10000 — AB

## 2016-08-31 ENCOUNTER — Ambulatory Visit (INDEPENDENT_AMBULATORY_CARE_PROVIDER_SITE_OTHER): Payer: Medicare Other | Admitting: Family Medicine

## 2016-08-31 ENCOUNTER — Encounter (INDEPENDENT_AMBULATORY_CARE_PROVIDER_SITE_OTHER): Payer: Self-pay | Admitting: Family Medicine

## 2016-08-31 VITALS — BP 185/82 | HR 88 | Temp 99.1°F | Resp 18 | Ht 66.0 in | Wt 280.0 lb

## 2016-08-31 DIAGNOSIS — R5383 Other fatigue: Secondary | ICD-10-CM | POA: Diagnosis not present

## 2016-08-31 DIAGNOSIS — E538 Deficiency of other specified B group vitamins: Secondary | ICD-10-CM | POA: Insufficient documentation

## 2016-08-31 DIAGNOSIS — E559 Vitamin D deficiency, unspecified: Secondary | ICD-10-CM | POA: Insufficient documentation

## 2016-08-31 DIAGNOSIS — R0602 Shortness of breath: Secondary | ICD-10-CM | POA: Insufficient documentation

## 2016-08-31 DIAGNOSIS — Z1389 Encounter for screening for other disorder: Secondary | ICD-10-CM | POA: Diagnosis not present

## 2016-08-31 DIAGNOSIS — R739 Hyperglycemia, unspecified: Secondary | ICD-10-CM

## 2016-08-31 DIAGNOSIS — Z9189 Other specified personal risk factors, not elsewhere classified: Secondary | ICD-10-CM

## 2016-08-31 DIAGNOSIS — R9431 Abnormal electrocardiogram [ECG] [EKG]: Secondary | ICD-10-CM | POA: Diagnosis not present

## 2016-08-31 DIAGNOSIS — E785 Hyperlipidemia, unspecified: Secondary | ICD-10-CM | POA: Diagnosis not present

## 2016-08-31 DIAGNOSIS — Z0289 Encounter for other administrative examinations: Secondary | ICD-10-CM

## 2016-08-31 DIAGNOSIS — Z6841 Body Mass Index (BMI) 40.0 and over, adult: Secondary | ICD-10-CM

## 2016-08-31 DIAGNOSIS — Z1331 Encounter for screening for depression: Secondary | ICD-10-CM

## 2016-08-31 DIAGNOSIS — IMO0001 Reserved for inherently not codable concepts without codable children: Secondary | ICD-10-CM

## 2016-08-31 NOTE — Progress Notes (Signed)
Office: [phone]  /  Fax: [phone]   HPI:   Chief Complaint: OBESITY  [name] (MR# [id number]) is a 71 y.o. female who presents on 08/31/2016 for obesity evaluation and treatment. Current BMI is Body mass index is 45.19 kg/m.[name] [place] has struggled with obesity for years and has been unsuccessful in either losing weight or maintaining long term weight loss. [name] attended our information session and states she is currently in the action stage of change and ready to dedicate time achieving and maintaining a healthier weight.  [name] states she thinks her family will eat healthier with  her her desired weight is 250 she has been heavy most of  her life she started gaining weight after childbirth her heaviest weight ever was 312 lbs. she is a picky eater and doesn't like to eat healthier foods  she skips meals frequently she is frequently drinking liquids with calories she frequently makes poor food choices   [name] feels her energy is lower than it should be. This has worsened with weight gain and has not worsened recently. [name] admits to daytime somnolence and  admits to waking up still tired. Patient is at risk for obstructive sleep apnea. Patent has a history of symptoms of daytime [name] and Epworth sleepiness scale. Patient generally gets 5 hours of sleep per night, and states they generally have generally restful sleep. Snoring is present. Apneic episodes are not present. Epworth Sleepiness Score is 10  Dyspnea on exertion [name] notes increasing shortness of breath with exercising and seems to be worsening over time with weight gain. She notes getting out of breath sooner with activity than she used to. This has not gotten worse recently. [name] denies orthopnea.  Abnormal EKG [name] had abnormal EKG today. She denies shortness of breath at rest. The ECG shows a possible old infarct. She has a systolic murmur as well which she states has not been investigated to her  knowledge.  Hyperlipidemia [name] has hyperlipidemia and has been trying to improve her cholesterol levels with intensive lifestyle modification including a low saturated fat diet, exercise and weight loss. She is intolerant of multiple Statins. She denies any chest pain.  Hypertension [name] is a 71 y.o. female with hypertension.  [name] denies chest pain or shortness of breath on exertion. She is working weight loss to help control her blood pressure with the goal of decreasing her risk of heart attack and stroke. [name] blood pressure is not currently controlled.  At risk for cardiovascular disease [name] is at a higher than average risk for cardiovascular disease due to obesity. She currently denies any chest pain.  Hyperglycemia [name] has a history of elevated fasting blood sugars, no recent A1c. She also has neuropathy.  Vitamin D deficiency [name] has a diagnosis of vitamin D deficiency. She is currently taking vit D  Admits [name] and denies nausea, vomiting or muscle weakness. No recent labs.  Vitamin B12 Deficiency [name] is currently on B12, no recent labs, she admits [name].  Depression Screen [name]'s Food and Mood (modified PHQ-9) score was  Depression screen PHQ 2/9 08/31/2016  Decreased Interest 1  Down, Depressed, Hopeless 1  PHQ - 2 Score 2  Altered sleeping 1  Tired, decreased energy 3  Change in appetite 0  Feeling bad or failure about yourself  0  Trouble concentrating 2  Moving slowly or fidgety/restless 1  Suicidal thoughts 0  PHQ-9 Score 9    ALLERGIES: Allergies  Allergen Reactions  . Cymbalta [Duloxetine  Hcl] Other (See Comments)    Stomach pain  . Gabapentin   . Lipitor [Atorvastatin] Other (See Comments)    Cramps  . Maxzide [Hydrochlorothiazide W-Triamterene] Other (See Comments)    Cramping   . Pravastatin Sodium Other (See Comments)    Headache   . Simvastatin Other (See Comments)    Memory loss  . Lodine [Etodolac]  Itching    MEDICATIONS: Current Outpatient Prescriptions on File Prior to Visit  Medication Sig Dispense Refill  . amitriptyline (ELAVIL) 25 MG tablet Take 25 mg by mouth at bedtime.    [name] aspirin 81 MG tablet Take 81 mg by mouth daily.    [name] escitalopram (LEXAPRO) 10 MG tablet Take 10 mg by mouth daily.    [name] HYDROcodone-acetaminophen (NORCO/VICODIN) 5-325 MG tablet Take 1-2 tablets by mouth every 6 (six) hours as needed. 10 tablet 0  . irbesartan (AVAPRO) 300 MG tablet Take 300 mg by mouth at bedtime.   0  . IRON PO Take 18 mg by mouth daily.    [name] ketoconazole (NIZORAL) 2 % cream Apply 1 application topically daily. 15 g 0  . niacin 500 MG CR capsule Take 500 mg by mouth 2 (two) times daily with a meal.    . Omega-3 Fatty Acids (FISH OIL) 1200 MG CAPS Take 1,200 mg by mouth 2 (two) times daily.    . ondansetron (ZOFRAN) 4 MG tablet Take 1 tablet (4 mg total) by mouth every 6 (six) hours. 12 tablet 0  . tamoxifen (NOLVADEX) 20 MG tablet Take 1 tablet (20 mg total) by mouth daily. 90 tablet 3  . traMADol (ULTRAM) 50 MG tablet take 1 tablet by mouth every 6 hours as needed for pain  0  . verapamil (VERELAN PM) 240 MG 24 hr capsule Take 240 mg by mouth daily.     . vitamin B-12 (CYANOCOBALAMIN) 1000 MCG tablet Take 1,000 mcg by mouth daily.    . Vitamin D, Cholecalciferol, 400 UNITS TABS Take 400 Units by mouth at bedtime.     No current facility-administered medications on file prior to visit.     PAST MEDICAL HISTORY: Past Medical History:  Diagnosis Date  . Anemia   . Anxiety    takes Xanax daily  . Arthritis   . Back pain   . Blood transfusion   . Breast cancer ([place]) 2013   left breast  . Bronchitis    hx of  . Chronic [name] syndrome   . Depression    takes Lexapro daily  . Diabetes mellitus without complication ([place])    boderline  . Diverticulosis   . Edema    feet and legs  . Headache(784.0)   . History of colon polyps 1998   adenomatous  . Hyperlipidemia    takes  Niacin daily  . Hypertension    takes Verapamil and Avapro daily  . Joint pain   . OSA (obstructive sleep apnea)   . Pneumonia 4/12   Albuterol daily as needed  . Radiation 10/29/11-11/24/11   left breast 6100 cGy  . Shortness of breath    with exertion  . Spinal stenosis   . Tinnitus     PAST SURGICAL HISTORY: Past Surgical History:  Procedure Laterality Date  . APPENDECTOMY  1978  . BACK SURGERY    . BELPHAROPTOSIS REPAIR    . BREAST LUMPECTOMY  08/2011   left  . BREAST LUMPECTOMY WITH NEEDLE LOCALIZATION Right 02/06/2013   Procedure: RIGHT BREAST LUMPECTOMY WITH NEEDLE LOCALIZATION;  Surgeon: Harl Bowie, MD;  Location: [facility];  Service: General;  Laterality: Right;  . COLONOSCOPY W/ POLYPECTOMY    . COLONOSCOPY WITH PROPOFOL N/A 10/27/2015   Procedure: COLONOSCOPY WITH PROPOFOL;  Surgeon: Mauri Pole, MD;  Location: [facility];  Service: Endoscopy;  Laterality: N/A;  . HARDWARE REMOVAL Left 04/20/2015   Procedure: HARDWARE REMOVAL LEFT LUMBAR FIVE SCREW;  Surgeon: Karie Chimera, MD;  Location: [facility];  Service: Neurosurgery;  Laterality: Left;  . JOINT REPLACEMENT Bilateral    bilateral knee  . POLYPECTOMY  1990's  . THYROID CYST EXCISION  1994  . TOTAL KNEE ARTHROPLASTY  2006   bilateral  . TRANSPHENOIDAL / TRANSNASAL HYPOPHYSECTOMY / RESECTION PITUITARY TUMOR  6/11    SOCIAL HISTORY: Social History  Substance Use Topics  . Smoking status: Never Smoker  . Smokeless tobacco: Never Used  . Alcohol use No    FAMILY HISTORY: Family History  Problem Relation Age of Onset  . Heart disease Father   . Clotting disorder Father   . Stroke Father   . Hypertension Father   . Heart Problems Father   . Stroke Mother   . Diabetes Mother   . Hypertension Mother   . Hyperlipidemia Mother   . Obesity Mother   . Cancer Sister 31    Breast Cancer  . Colon cancer Neg Hx   . Neuropathy Neg Hx     ROS: Review of Systems  Constitutional: Positive for  malaise/[name].  HENT: Positive for hearing loss.        Dentures Dry Mouth  Eyes:       Wear Glasses or Contacts  Cardiovascular: Positive for claudication. Negative for chest pain and orthopnea.       Shortness of breath with activity Very Cold Feet or Hands  Gastrointestinal: Negative for nausea and vomiting.  Genitourinary: Positive for frequency.  Musculoskeletal: Positive for back pain and myalgias.       Neck Stiffness Negative Muscle Weakness  Skin: Positive for itching.  Endo/Heme/Allergies: Positive for polydipsia.  Psychiatric/Behavioral: Positive for depression.       Stress    PHYSICAL EXAM: Blood pressure (!) 185/82, pulse 88, temperature 99.1 F (37.3 C), temperature source Oral, resp. rate 18, height 5\' 6"  (1.676 m), weight 280 lb (127 kg), SpO2 95 %. Body mass index is 45.19 kg/m. Physical Exam  Constitutional: [name], place, and time. She appears well-developed and well-nourished.  Cardiovascular:  Murmur (Grade 1 out of 6 Holosystolic Murmur) heard. Pulmonary/Chest: Effort normal.  Musculoskeletal: Normal range of motion.  Neurological: [name], place, and time.  Skin: Skin is warm and dry.  Acanthosis Nigricans Cervical neck skin tags  Psychiatric: She has a normal mood and affect. Her behavior is normal.  Vitals reviewed.   RECENT LABS AND TESTS: BMET    Component Value Date/Time   NA 138 08/21/2016 2027   NA 140 06/01/2016 1143   K 4.1 08/21/2016 2027   K 4.0 06/01/2016 1143   CL 106 08/21/2016 2027   CL 105 12/09/2012 1423   CO2 24 08/21/2016 2027   CO2 26 06/01/2016 1143   GLUCOSE 139 (H) 08/21/2016 2027   GLUCOSE 121 06/01/2016 1143   GLUCOSE 177 (H) 12/09/2012 1423   BUN 13 08/21/2016 2027   BUN 12.5 06/01/2016 1143   CREATININE 1.54 (H) 08/21/2016 2027   CREATININE 1.1 06/01/2016 1143   CALCIUM 9.9 08/21/2016 2027   CALCIUM 10.1 06/01/2016 1143  GFRNONAA 33 (L) 08/21/2016 2027   GFRAA 38 (L)  08/21/2016 2027   Lab Results  Component Value Date   HGBA1C (H) 11/11/2010    6.7 (NOTE)                                                                       According to the ADA Clinical Practice Recommendations for 2011, when HbA1c is used as a screening test:   >=6.5%   Diagnostic of Diabetes Mellitus           (if abnormal result  is confirmed)  5.7-6.4%   Increased risk of developing Diabetes Mellitus  References:Diagnosis and Classification of Diabetes Mellitus,Diabetes [id number] 1):S62-S69 and Standards of Medical Care in         Diabetes - 2011,Diabetes Care,2011,34  (Suppl 1):S11-S61.   No results found for: INSULIN CBC    Component Value Date/Time   WBC 14.5 (H) 08/21/2016 2027   RBC 4.46 08/21/2016 2027   HGB 11.8 (L) 08/21/2016 2027   HGB 11.1 (L) 06/01/2016 1143   HCT 35.6 (L) 08/21/2016 2027   HCT 34.9 06/01/2016 1143   PLT 267 08/21/2016 2027   PLT 248 06/01/2016 1143   MCV 79.8 08/21/2016 2027   MCV 81.2 06/01/2016 1143   MCH 26.5 08/21/2016 2027   MCHC 33.1 08/21/2016 2027   RDW 15.1 08/21/2016 2027   RDW 15.8 (H) 06/01/2016 1143   LYMPHSABS 1.8 06/01/2016 1143   MONOABS 0.6 06/01/2016 1143   EOSABS 0.1 06/01/2016 1143   BASOSABS 0.0 06/01/2016 1143   Iron/TIBC/Ferritin/ %Sat    Component Value Date/Time   FERRITIN 362 (H) 04/15/2015 1058   Lipid Panel     Component Value Date/Time   CHOL (H) 11/11/2010 0555    205        ATP III CLASSIFICATION:  <200     mg/dL   Desirable  200-239  mg/dL   Borderline High  >=240    mg/dL   High          TRIG 83 11/11/2010 0555   HDL 32 (L) 11/11/2010 0555   CHOLHDL 6.4 11/11/2010 0555   VLDL 17 11/11/2010 0555   LDLCALC (H) 11/11/2010 0555    156        Total Cholesterol/HDL:CHD Risk Coronary Heart Disease Risk Table                     Men   Women  1/2 Average Risk   3.4   3.3  Average Risk       5.0   4.4  2 X Average Risk   9.6   7.1  3 X Average Risk  23.4   11.0        Use the calculated  Patient Ratio above and the CHD Risk Table to determine the patient's CHD Risk.        ATP III CLASSIFICATION (LDL):  <100     mg/dL   Optimal  100-129  mg/dL   Near or Above                    Optimal  130-159  mg/dL   Borderline  160-189  mg/dL   High  >  190     mg/dL   Very High   Hepatic Function Panel     Component Value Date/Time   PROT 7.6 06/01/2016 1143   ALBUMIN 3.2 (L) 06/01/2016 1143   AST 11 06/01/2016 1143   ALT 12 06/01/2016 1143   ALKPHOS 69 06/01/2016 1143   BILITOT 0.28 06/01/2016 1143     ECG  shows NSR with a rate of 84 BPM INDIRECT CALORIMETER done today shows a VO2 of 176 and a REE of 1224.    ASSESSMENT AND PLAN: Other [name] - Plan: EKG 12-Lead, Vitamin B12, CBC With Differential, Comprehensive metabolic panel, Folate, T3, T4, free, TSH  Shortness of breath on exertion  Depression screening  Hyperglycemia - Plan: Hemoglobin A1c, Insulin, random  Vitamin D deficiency - Plan: VITAMIN D 25 Hydroxy (Vit-D Deficiency, Fractures)  Vitamin B 12 deficiency  Hyperlipidemia, unspecified hyperlipidemia type - Plan: Lipid Panel With LDL/HDL Ratio  Abnormal EKG  At risk for heart disease  Morbid obesity (HCC)  PLAN:  [name] Leonda was informed that her [name] may be related to obesity, depression or many other causes. Labs will be ordered, and in the meanwhile Galilea has agreed to work on diet, exercise and weight loss to help with [name]. Proper sleep hygiene was discussed including the need for 7-8 hours of quality sleep each night. A sleep study was not ordered based on symptoms and Epworth score.  Dyspnea on exertion Indianna's shortness of breath appears to be obesity related and exercise induced. She has agreed to work on weight loss and gradually increase exercise to treat her exercise induced shortness of breath. If Brittnay follows our instructions and loses weight without improvement of her shortness of breath, we will plan to refer to  pulmonology. We will monitor this condition regularly. Tucker agrees to this plan.  Abnormal EKG Echocardiogram was ordered and appointment was scheduled for Monday February 5th 8:45 in the office of Dr. Lilly Cove. We will follow up at next visit.  Hyperlipidemia Tawana was informed of the American Heart Association Guidelines emphasizing intensive lifestyle modifications as the first line treatment for hyperlipidemia. We discussed many lifestyle modifications today in depth, and Suly will continue to work on decreasing saturated fats such as fatty red meat, butter and many fried foods. She will also increase vegetables and lean protein in her diet and continue to work on exercise and weight loss efforts.   Hypertension Lalonnie had elevated blood pressure today and she thinks this may be white coat syndrome. She has a history of sleep apnea and uses CPAP. Sidra agrees to work on controlling blood pressure and we will follow up at next visit.  Cardiovascular risk counselling Shaketha was given extended (at least 15 minutes) coronary artery disease prevention counseling today. She is 71 y.o. female and has risk factors for heart disease including obesity. We discussed intensive lifestyle modifications today with an emphasis on specific weight loss instructions and strategies. Pt was also informed of the importance of increasing exercise and decreasing saturated fats to help prevent heart disease.  Hyperglycemia Fasting labs will be obtained and results with be discussed with [name] in 2 weeks at her follow up visit. In the meanwhile Rehanna was started on a lower simple carbohydrate diet and will work on weight loss efforts.  Vitamin D Deficiency Jeri was informed that low vitamin D levels contributes to [name] and are associated with obesity, breast, and colon cancer. She agrees to continue to take prescription Vit D @50 ,000  IU every week and we will check labs and will follow up for routine  testing of vitamin D, at least 2-3 times per year. She was informed of the risk of over-replacement of vitamin D and agrees to not increase her dose unless he discusses this with [name] first. She will follow up at next visit in 2 weeks.  Vitamin B12 Deficiency Will check labs and follow  Depression Screen Tkiyah had a positive depression screening. Depression is commonly associated with obesity and often results in emotional eating behaviors. We will monitor this closely and work on CBT to help improve the non-hunger eating patterns. Referral to Psychology may be required if no improvement is seen as she continues in our clinic.  Obesity Gloriana is currently in the action stage of change and her goal is to continue with weight loss efforts She has agreed to follow the Category 2 plan Railyn has been instructed to work up to a goal of 150 minutes of combined cardio and strengthening exercise per week for weight loss and overall health benefits. We discussed the following Behavioral Modification Stratagies today: increasing lean protein intake, decreasing simple carbohydrates , increasing vegetables, increasing lower sugar fruits and not skipping meals. Shamon has agreed to follow up with our clinic in 2 weeks. She was informed of the importance of frequent follow up visits to maximize her success with intensive lifestyle modifications for her multiple health conditions. She was informed we would discuss her lab results at her next visit unless there is a critical issue that needs to be addressed sooner. Layaan agreed to keep her next visit at the agreed upon time to discuss these results.  I, Doreene Nest, am acting as scribe for Dennard Nip, MD  I have reviewed the above documentation for accuracy and completeness, and I agree with the above. -Dennard Nip, MD

## 2016-09-01 LAB — COMPREHENSIVE METABOLIC PANEL
ALBUMIN: 4.2 g/dL (ref 3.5–4.8)
ALK PHOS: 66 IU/L (ref 39–117)
ALT: 10 IU/L (ref 0–32)
AST: 14 IU/L (ref 0–40)
Albumin/Globulin Ratio: 1.2 (ref 1.2–2.2)
BUN / CREAT RATIO: 15 (ref 12–28)
BUN: 16 mg/dL (ref 8–27)
Bilirubin Total: 0.2 mg/dL (ref 0.0–1.2)
CALCIUM: 10.3 mg/dL (ref 8.7–10.3)
CO2: 23 mmol/L (ref 18–29)
CREATININE: 1.1 mg/dL — AB (ref 0.57–1.00)
Chloride: 103 mmol/L (ref 96–106)
GFR calc Af Amer: 58 mL/min/{1.73_m2} — ABNORMAL LOW (ref >59)
GFR calc non Af Amer: 50 mL/min/{1.73_m2} — ABNORMAL LOW (ref >59)
GLUCOSE: 103 mg/dL — AB (ref 65–99)
Globulin, Total: 3.4 g/dL (ref 1.5–4.5)
Potassium: 4.7 mmol/L (ref 3.5–5.2)
Sodium: 144 mmol/L (ref 134–144)
Total Protein: 7.6 g/dL (ref 6.0–8.5)

## 2016-09-01 LAB — VITAMIN B12

## 2016-09-01 LAB — LIPID PANEL WITH LDL/HDL RATIO
Cholesterol, Total: 219 mg/dL — ABNORMAL HIGH (ref 100–199)
HDL: 44 mg/dL (ref >39)
LDL Calculated: 139 mg/dL — ABNORMAL HIGH (ref 0–99)
LDl/HDL Ratio: 3.2 ratio units (ref 0.0–3.2)
Triglycerides: 178 mg/dL — ABNORMAL HIGH (ref 0–149)
VLDL Cholesterol Cal: 36 mg/dL (ref 5–40)

## 2016-09-01 LAB — CBC WITH DIFFERENTIAL
BASOS ABS: 0 10*3/uL (ref 0.0–0.2)
Basos: 0 %
EOS (ABSOLUTE): 0.1 10*3/uL (ref 0.0–0.4)
Eos: 2 %
HEMOGLOBIN: 11 g/dL — AB (ref 11.1–15.9)
Hematocrit: 34.4 % (ref 34.0–46.6)
IMMATURE GRANS (ABS): 0 10*3/uL (ref 0.0–0.1)
IMMATURE GRANULOCYTES: 0 %
LYMPHS: 20 %
Lymphocytes Absolute: 1.7 10*3/uL (ref 0.7–3.1)
MCH: 25.8 pg — ABNORMAL LOW (ref 26.6–33.0)
MCHC: 32 g/dL (ref 31.5–35.7)
MCV: 81 fL (ref 79–97)
MONOCYTES: 8 %
Monocytes Absolute: 0.6 10*3/uL (ref 0.1–0.9)
Neutrophils Absolute: 5.7 10*3/uL (ref 1.4–7.0)
Neutrophils: 70 %
RBC: 4.27 x10E6/uL (ref 3.77–5.28)
RDW: 15.6 % — ABNORMAL HIGH (ref 12.3–15.4)
WBC: 8.2 10*3/uL (ref 3.4–10.8)

## 2016-09-01 LAB — TSH: TSH: 1.71 u[IU]/mL (ref 0.450–4.500)

## 2016-09-01 LAB — T3: T3, Total: 129 ng/dL (ref 71–180)

## 2016-09-01 LAB — HEMOGLOBIN A1C
Est. average glucose Bld gHb Est-mCnc: 134 mg/dL
HEMOGLOBIN A1C: 6.3 % — AB (ref 4.8–5.6)

## 2016-09-01 LAB — T4, FREE: FREE T4: 0.99 ng/dL (ref 0.82–1.77)

## 2016-09-01 LAB — VITAMIN D 25 HYDROXY (VIT D DEFICIENCY, FRACTURES): Vit D, 25-Hydroxy: 32.9 ng/mL (ref 30.0–100.0)

## 2016-09-01 LAB — FOLATE: FOLATE: 5.4 ng/mL (ref >3.0)

## 2016-09-01 LAB — INSULIN, RANDOM: INSULIN: 23.5 u[IU]/mL (ref 2.6–24.9)

## 2016-09-14 ENCOUNTER — Ambulatory Visit (INDEPENDENT_AMBULATORY_CARE_PROVIDER_SITE_OTHER): Payer: Medicare Other | Admitting: Family Medicine

## 2016-09-14 VITALS — BP 171/80 | HR 84 | Temp 98.8°F | Ht 66.0 in | Wt 274.0 lb

## 2016-09-14 DIAGNOSIS — Z6841 Body Mass Index (BMI) 40.0 and over, adult: Secondary | ICD-10-CM | POA: Diagnosis not present

## 2016-09-14 DIAGNOSIS — Z9189 Other specified personal risk factors, not elsewhere classified: Secondary | ICD-10-CM

## 2016-09-14 DIAGNOSIS — I5189 Other ill-defined heart diseases: Secondary | ICD-10-CM

## 2016-09-14 DIAGNOSIS — R748 Abnormal levels of other serum enzymes: Secondary | ICD-10-CM | POA: Diagnosis not present

## 2016-09-14 DIAGNOSIS — I519 Heart disease, unspecified: Secondary | ICD-10-CM | POA: Diagnosis not present

## 2016-09-14 DIAGNOSIS — R7303 Prediabetes: Secondary | ICD-10-CM | POA: Diagnosis not present

## 2016-09-14 DIAGNOSIS — I10 Essential (primary) hypertension: Secondary | ICD-10-CM

## 2016-09-14 DIAGNOSIS — E559 Vitamin D deficiency, unspecified: Secondary | ICD-10-CM

## 2016-09-14 DIAGNOSIS — IMO0001 Reserved for inherently not codable concepts without codable children: Secondary | ICD-10-CM

## 2016-09-14 MED ORDER — VITAMIN D (ERGOCALCIFEROL) 1.25 MG (50000 UNIT) PO CAPS: 50000.0000 [IU] | ORAL_CAPSULE | ORAL | 0 refills | Status: DC

## 2016-09-14 MED ORDER — HYDROCHLOROTHIAZIDE 12.5 MG PO TABS: 12.5000 mg | ORAL_TABLET | Freq: Every day | ORAL | 0 refills | Status: DC

## 2016-09-14 NOTE — Progress Notes (Signed)
Office: [phone]  /  Fax: [phone]   HPI:   Chief Complaint: OBESITY [name] is here to discuss her progress with her obesity treatment plan. She is following her eating plan approximately 93 % of the time and states she is exercising 0 minutes 0 times per week. [name] has done well with weight loss but isn't always eating all her food. She is skipping dinner approximately 50% of the time.  Her weight is 274 lb (124.3 kg) today and has had a weight loss of 6 pounds over a period of 2 weeks since her last visit. She has lost 6 lbs since starting treatment with [name].  Vitamin D deficiency [name] has a new diagnosis of vitamin D deficiency. She is not currently taking vit D and not yet at goal. She admits fatigue and denies nausea, vomiting or muscle weakness.  Hypertension [name] is a 71 y.o. female with hypertension. Her blood pressure is elevated still, she denies chest pain or shortness of breath on exertion and is currently on Verapamil and Avapro. She has a new diagnosis of diastolic dysfunction. She is working weight loss to help control her blood pressure with the goal of decreasing her risk of heart attack and stroke. [name] blood pressure is not currently controlled.  Diastolic Dysfunction Grade I [name] has a new diagnosis of diastolic dysfunction, she denies shortness of breath at rest and blood pressure is uncontrolled.  Chronic Renal Failure [name] has chronic renal failure which has improved from last lab. She did have nephrolithiasis recently.  Elevated B12 [name] has a diagnosis of elevated B12 and is currently on B12 supplement 1,000 mg per day.  Pre-Diabetes [name] has a new diagnosis of pre-diabetes based on her elevated HgA1c and was informed this puts her at greater risk of developing diabetes. She is not taking metformin currently and continues to work on diet and exercise to decrease risk of diabetes. She admits polyphagia and denies nausea or  hypoglycemia.  At risk for diabetes [name] is at higher than average risk for developing diabetes due to her obesity. She currently denies polyuria or polydipsia.   Wt Readings from Last 500 Encounters:  09/14/16 274 lb (124.3 kg)  08/31/16 280 lb (127 kg)  08/21/16 285 lb (129.3 kg)  07/20/16 287 lb 12.8 oz (130.5 kg)  06/01/16 286 lb 14.4 oz (130.1 kg)  01/20/16 288 lb (130.6 kg)  10/27/15 286 lb (129.7 kg)  09/01/15 286 lb 12.8 oz (130.1 kg)  06/29/15 286 lb 12.8 oz (130.1 kg)  04/15/15 285 lb 1.6 oz (129.3 kg)  04/13/15 284 lb 9.6 oz (129.1 kg)  12/29/14 289 lb 14.5 oz (131.5 kg)  12/23/14 290 lb 12.8 oz (131.9 kg)  10/28/14 292 lb (132.5 kg)  08/13/14 295 lb 9.6 oz (134.1 kg)  02/06/14 295 lb 6.4 oz (134 kg)  12/11/13 294 lb 8 oz (133.6 kg)  06/12/13 284 lb 11.2 oz (129.1 kg)  02/25/13 292 lb 9.6 oz (132.7 kg)  02/11/13 286 lb 9.6 oz (130 kg)  02/05/13 290 lb 9.6 oz (131.8 kg)  12/09/12 294 lb (133.4 kg)  11/18/12 296 lb (134.3 kg)  10/11/12 294 lb (133.4 kg)  10/10/12 295 lb 14.4 oz (134.2 kg)  09/23/12 294 lb 12.8 oz (133.7 kg)  08/27/12 295 lb 3.2 oz (133.9 kg)  07/17/12 292 lb 14.4 oz (132.9 kg)  06/14/12 292 lb (132.5 kg)  06/03/12 290 lb 9.6 oz (131.8 kg)  05/27/12 292 lb 8 oz (132.7 kg)  04/15/12 294 lb 11.2 oz (133.7 kg)  01/17/12 298 lb 1.6 oz (135.2 kg)  12/27/11 294 lb 1.6 oz (133.4 kg)  12/07/11 292 lb (132.5 kg)  11/24/11 297 lb 12.8 oz (135.1 kg)  11/14/11 298 lb 8 oz (135.4 kg)  11/07/11 298 lb 3.2 oz (135.3 kg)  10/31/11 298 lb 6.4 oz (135.4 kg)  10/24/11 294 lb (133.4 kg)  10/17/11 298 lb 3.2 oz (135.3 kg)  10/06/11 295 lb 6.4 oz (134 kg)  09/29/11 295 lb 4.8 oz (133.9 kg)  09/15/11 295 lb (133.8 kg)  09/11/11 294 lb 6.4 oz (133.5 kg)  09/11/11 294 lb 4.8 oz (133.5 kg)  09/05/11 296 lb 12.8 oz (134.6 kg)  08/10/11 299 lb 3.2 oz (135.7 kg)  04/04/11 298 lb 12.8 oz (135.5 kg)  01/30/11 (!) 303 lb 12.8 oz (137.8 kg)  10/15/07 (!) 317 lb  (143.8 kg)  02/15/05 (!) 275 lb (124.7 kg)  09/23/07 (!) 304 lb (137.9 kg)     ALLERGIES: Allergies  Allergen Reactions  . Cymbalta [Duloxetine Hcl] Other (See Comments)    Stomach pain  . Gabapentin   . Lipitor [Atorvastatin] Other (See Comments)    Cramps  . Maxzide [Hydrochlorothiazide W-Triamterene] Other (See Comments)    Cramping   . Pravastatin Sodium Other (See Comments)    Headache   . Simvastatin Other (See Comments)    Memory loss  . Lodine [Etodolac] Itching    MEDICATIONS: Current Outpatient Prescriptions on File Prior to Visit  Medication Sig Dispense Refill  . amitriptyline (ELAVIL) 25 MG tablet Take 25 mg by mouth at bedtime.    [name] aspirin 81 MG tablet Take 81 mg by mouth daily.    [name] escitalopram (LEXAPRO) 10 MG tablet Take 10 mg by mouth daily.    [name] HYDROcodone-acetaminophen (NORCO/VICODIN) 5-325 MG tablet Take 1-2 tablets by mouth every 6 (six) hours as needed. 10 tablet 0  . irbesartan (AVAPRO) 300 MG tablet Take 300 mg by mouth at bedtime.   0  . IRON PO Take 18 mg by mouth daily.    [name] ketoconazole (NIZORAL) 2 % cream Apply 1 application topically daily. 15 g 0  . niacin 500 MG CR capsule Take 500 mg by mouth 2 (two) times daily with a meal.    . Omega-3 Fatty Acids (FISH OIL) 1200 MG CAPS Take 1,200 mg by mouth 2 (two) times daily.    . ondansetron (ZOFRAN) 4 MG tablet Take 1 tablet (4 mg total) by mouth every 6 (six) hours. 12 tablet 0  . tamoxifen (NOLVADEX) 20 MG tablet Take 1 tablet (20 mg total) by mouth daily. 90 tablet 3  . traMADol (ULTRAM) 50 MG tablet take 1 tablet by mouth every 6 hours as needed for pain  0  . verapamil (VERELAN PM) 240 MG 24 hr capsule Take 240 mg by mouth daily.     . vitamin B-12 (CYANOCOBALAMIN) 1000 MCG tablet Take 1,000 mcg by mouth daily.    . Vitamin D, Cholecalciferol, 400 UNITS TABS Take 400 Units by mouth at bedtime.     No current facility-administered medications on file prior to visit.     PAST MEDICAL  HISTORY: Past Medical History:  Diagnosis Date  . Anemia   . Anxiety    takes Xanax daily  . Arthritis   . Back pain   . Blood transfusion   . Breast cancer ([place]) 2013   left breast  . Bronchitis    hx of  .  Chronic fatigue syndrome   . Depression    takes Lexapro daily  . Diabetes mellitus without complication ([place])    boderline  . Diverticulosis   . Edema    feet and legs  . Headache(784.0)   . History of colon polyps 1998   adenomatous  . Hyperlipidemia    takes Niacin daily  . Hypertension    takes Verapamil and Avapro daily  . Joint pain   . OSA (obstructive sleep apnea)   . Pneumonia 4/12   Albuterol daily as needed  . Radiation 10/29/11-11/24/11   left breast 6100 cGy  . Shortness of breath    with exertion  . Spinal stenosis   . Tinnitus     PAST SURGICAL HISTORY: Past Surgical History:  Procedure Laterality Date  . APPENDECTOMY  1978  . BACK SURGERY    . BELPHAROPTOSIS REPAIR    . BREAST LUMPECTOMY  08/2011   left  . BREAST LUMPECTOMY WITH NEEDLE LOCALIZATION Right 02/06/2013   Procedure: RIGHT BREAST LUMPECTOMY WITH NEEDLE LOCALIZATION;  Surgeon: Harl Bowie, MD;  Location: [facility];  Service: General;  Laterality: Right;  . COLONOSCOPY W/ POLYPECTOMY    . COLONOSCOPY WITH PROPOFOL N/A 10/27/2015   Procedure: COLONOSCOPY WITH PROPOFOL;  Surgeon: Mauri Pole, MD;  Location: [facility];  Service: Endoscopy;  Laterality: N/A;  . HARDWARE REMOVAL Left 04/20/2015   Procedure: HARDWARE REMOVAL LEFT LUMBAR FIVE SCREW;  Surgeon: Karie Chimera, MD;  Location: [facility];  Service: Neurosurgery;  Laterality: Left;  . JOINT REPLACEMENT Bilateral    bilateral knee  . POLYPECTOMY  1990's  . THYROID CYST EXCISION  1994  . TOTAL KNEE ARTHROPLASTY  2006   bilateral  . TRANSPHENOIDAL / TRANSNASAL HYPOPHYSECTOMY / RESECTION PITUITARY TUMOR  6/11    SOCIAL HISTORY: Social History  Substance Use Topics  . Smoking status: Never Smoker  . Smokeless tobacco:  Never Used  . Alcohol use No    FAMILY HISTORY: Family History  Problem Relation Age of Onset  . Heart disease Father   . Clotting disorder Father   . Stroke Father   . Hypertension Father   . Heart Problems Father   . Stroke Mother   . Diabetes Mother   . Hypertension Mother   . Hyperlipidemia Mother   . Obesity Mother   . Cancer Sister 45    Breast Cancer  . Colon cancer Neg Hx   . Neuropathy Neg Hx     ROS: Review of Systems  Constitutional: Positive for malaise/fatigue.  Respiratory: Negative for shortness of breath (at rest or on exertion).   Cardiovascular: Negative for chest pain.  Gastrointestinal: Negative for nausea and vomiting.  Genitourinary: Negative for frequency.  Musculoskeletal:       Negative Muscle Weakness  Endo/Heme/Allergies: Negative for polydipsia.       Polyphagia Negative Hypoglycemia    PHYSICAL EXAM: Blood pressure (!) 171/80, pulse 84, temperature 98.8 F (37.1 C), temperature source Oral, height 5\' 6"  (1.676 m), weight 274 lb (124.3 kg), SpO2 100 %. Body mass index is 44.22 kg/m. Physical Exam  Constitutional: [name], place, and time. She appears well-developed and well-nourished.  Cardiovascular: Normal rate.   Pulmonary/Chest: Effort normal.  Musculoskeletal: Normal range of motion.  Neurological: [name], place, and time.  Skin: Skin is warm and dry.  Psychiatric: She has a normal mood and affect. Her behavior is normal.  Vitals reviewed.   RECENT LABS AND TESTS:  BMET    Component Value Date/Time   NA 144 08/31/2016 1000   NA 140 06/01/2016 1143   K 4.7 08/31/2016 1000   K 4.0 06/01/2016 1143   CL 103 08/31/2016 1000   CL 105 12/09/2012 1423   CO2 23 08/31/2016 1000   CO2 26 06/01/2016 1143   GLUCOSE 103 (H) 08/31/2016 1000   GLUCOSE 139 (H) 08/21/2016 2027   GLUCOSE 121 06/01/2016 1143   GLUCOSE 177 (H) 12/09/2012 1423   BUN 16 08/31/2016 1000   BUN 12.5 06/01/2016 1143    CREATININE 1.10 (H) 08/31/2016 1000   CREATININE 1.1 06/01/2016 1143   CALCIUM 10.3 08/31/2016 1000   CALCIUM 10.1 06/01/2016 1143   GFRNONAA 50 (L) 08/31/2016 1000   GFRAA 58 (L) 08/31/2016 1000   Lab Results  Component Value Date   HGBA1C 6.3 (H) 08/31/2016   HGBA1C (H) 11/11/2010    6.7 (NOTE)                                                                       According to the ADA Clinical Practice Recommendations for 2011, when HbA1c is used as a screening test:   >=6.5%   Diagnostic of Diabetes Mellitus           (if abnormal result  is confirmed)  5.7-6.4%   Increased risk of developing Diabetes Mellitus  References:Diagnosis and Classification of Diabetes Mellitus,Diabetes [id number] 1):S62-S69 and Standards of Medical Care in         Diabetes - 2011,Diabetes Care,2011,34  (Suppl 1):S11-S61.   Lab Results  Component Value Date   INSULIN 23.5 08/31/2016   CBC    Component Value Date/Time   WBC 8.2 08/31/2016 1000   WBC 14.5 (H) 08/21/2016 2027   RBC 4.27 08/31/2016 1000   RBC 4.46 08/21/2016 2027   HGB 11.8 (L) 08/21/2016 2027   HGB 11.1 (L) 06/01/2016 1143   HCT 34.4 08/31/2016 1000   HCT 34.9 06/01/2016 1143   PLT 267 08/21/2016 2027   PLT 248 06/01/2016 1143   MCV 81 08/31/2016 1000   MCV 81.2 06/01/2016 1143   MCH 25.8 (L) 08/31/2016 1000   MCH 26.5 08/21/2016 2027   MCHC 32.0 08/31/2016 1000   MCHC 33.1 08/21/2016 2027   RDW 15.6 (H) 08/31/2016 1000   RDW 15.8 (H) 06/01/2016 1143   LYMPHSABS 1.7 08/31/2016 1000   LYMPHSABS 1.8 06/01/2016 1143   MONOABS 0.6 06/01/2016 1143   EOSABS 0.1 08/31/2016 1000   BASOSABS 0.0 08/31/2016 1000   BASOSABS 0.0 06/01/2016 1143   Iron/TIBC/Ferritin/ %Sat    Component Value Date/Time   FERRITIN 362 (H) 04/15/2015 1058   Lipid Panel     Component Value Date/Time   CHOL 219 (H) 08/31/2016 1000   TRIG 178 (H) 08/31/2016 1000   HDL 44 08/31/2016 1000   CHOLHDL 6.4 11/11/2010 0555   VLDL 17 11/11/2010 0555    LDLCALC 139 (H) 08/31/2016 1000   Hepatic Function Panel     Component Value Date/Time   PROT 7.6 08/31/2016 1000   PROT 7.6 06/01/2016 1143   ALBUMIN 4.2 08/31/2016 1000   ALBUMIN 3.2 (L) 06/01/2016 1143   AST 14 08/31/2016 1000   AST 11 06/01/2016 1143  ALT 10 08/31/2016 1000   ALT 12 06/01/2016 1143   ALKPHOS 66 08/31/2016 1000   ALKPHOS 69 06/01/2016 1143   BILITOT 0.2 08/31/2016 1000   BILITOT 0.28 06/01/2016 1143     ASSESSMENT AND PLAN: Vitamin D deficiency - Plan: Vitamin D, Ergocalciferol, (DRISDOL) 50000 units CAPS capsule  Essential hypertension  Diastolic dysfunction - Grade I - Plan: hydrochlorothiazide (HYDRODIURIL) 12.5 MG tablet  Increased vitamin B12 level  Prediabetes  At risk for diabetes mellitus  Morbid obesity ([place])  PLAN:  Vitamin D Deficiency [name] was informed that low vitamin D levels contributes to fatigue and are associated with obesity, breast, and colon cancer. She agrees to start to take prescription Vit D @50 ,000 IU every week #4 with no refills and will follow up for routine testing of vitamin D, at least 2-3 times per year. She was informed of the risk of over-replacement of vitamin D and agrees to not increase her dose unless he discusses this with [name] first.  Hypertension [name] was advised of the importance of blood pressure control to improve longevity and decrease the chance of Myocardial Infarction and Cerebral Vascular Accident. We discussed sodium restriction, working on healthy weight loss, and a regular exercise program as the means to achieve improved blood pressure control. [name] agreed with this plan and agreed to follow up as directed. We will continue to monitor her blood pressure as well as her progress with the above lifestyle modifications. She will continue her medications as prescribed and she agrees to add HCTZ 12.5 mg every morning #30 with no refills and will watch for signs of hypotension as she continues her lifestyle  modifications.  Diastolic Dysfunction Grade I [name] was educated on the diagnosis of grade I diastolic dysfunction and the importance of blood pressure, cholesterol and weight control.  Chronic Renal Failure [name] agreed to continue to increase water and to work on weight loss to improve renal function. She was advised that tight blood pressure control is essential.  Elevated B12 [name] agrees to decrease B12 to every other day and we will re-check labs in 3 months and follow.  Pre-Diabetes [name] will continue to work on weight loss, exercise, and decreasing simple carbohydrates in her diet to help decrease the risk of diabetes. She was informed that eating too many simple carbohydrates or too many calories at one sitting increases the likelihood of GI side effects. We will defer Metformin due to chronic renal failure. We will re-check labs in 3 months and [name] agreed to follow up with [name] as directed to monitor her progress.  Diabetes risk counselling [name] was given extended (at least 30 minutes) diabetes prevention counseling today. She is 71 y.o. female and has risk factors for diabetes including obesity. We discussed intensive lifestyle modifications today with an emphasis on weight loss as well as increasing exercise and decreasing simple carbohydrates in her diet.  Obesity [name] is currently in the action stage of change. As such, her goal is to continue with weight loss efforts She has agreed to follow the Category 2 plan [name] has been instructed to work up to a goal of 150 minutes of combined cardio and strengthening exercise per week for weight loss and overall health benefits. We discussed the following Behavioral Modification Stratagies today: no skipping meals, increasing lean protein intake, decreasing simple carbohydrates  and increasing lower sugar fruits  [name] has agreed to follow up with our clinic in 2 weeks. She was informed of the importance of frequent follow  up visits  to maximize her success with intensive lifestyle modifications for her multiple health conditions.  I, Doreene Nest, am acting as scribe for Dennard Nip, MD  I have reviewed the above documentation for accuracy and completeness, and I agree with the above. -Dennard Nip, MD

## 2016-10-03 ENCOUNTER — Ambulatory Visit (INDEPENDENT_AMBULATORY_CARE_PROVIDER_SITE_OTHER): Payer: Medicare Other | Admitting: Family Medicine

## 2016-10-03 VITALS — BP 162/78 | HR 68 | Temp 98.7°F | Ht 66.0 in | Wt 273.0 lb

## 2016-10-03 DIAGNOSIS — Z6841 Body Mass Index (BMI) 40.0 and over, adult: Secondary | ICD-10-CM

## 2016-10-03 DIAGNOSIS — R7303 Prediabetes: Secondary | ICD-10-CM | POA: Diagnosis not present

## 2016-10-03 DIAGNOSIS — I10 Essential (primary) hypertension: Secondary | ICD-10-CM | POA: Insufficient documentation

## 2016-10-03 DIAGNOSIS — Z9189 Other specified personal risk factors, not elsewhere classified: Secondary | ICD-10-CM

## 2016-10-03 DIAGNOSIS — I5189 Other ill-defined heart diseases: Secondary | ICD-10-CM | POA: Insufficient documentation

## 2016-10-03 DIAGNOSIS — IMO0001 Reserved for inherently not codable concepts without codable children: Secondary | ICD-10-CM

## 2016-10-03 MED ORDER — HYDROCHLOROTHIAZIDE 12.5 MG PO TABS: 12.5000 mg | ORAL_TABLET | Freq: Every day | ORAL | 0 refills | Status: DC

## 2016-10-03 NOTE — Progress Notes (Signed)
Office: [phone]  /  Fax: [phone]   HPI:   Chief Complaint: OBESITY [name] is here to discuss her progress with her obesity treatment plan. She is following her eating plan approximately 90 % of the time and states she is exercising 0 minutes 0 times per week. [name] continues to lose weight but is deviating from her plan more. She walks a lot at work 3 days per week.  Her weight is 273 lb (123.8 kg) today and has had a weight loss of 1 pound over a period of 2 to 3 weeks since her last visit. She has lost 7 lbs since starting treatment with [name].  Hypertension [name] is a 71 y.o. female with hypertension. Her blood pressure is slowly improving with diet and weight loss but didn't start her HCTZ due to pharmacy error. She previously thought Maxide caused crampings but wasn't certain.        [name] denies chest pain or shortness of breath on exertion. She is working weight loss to help control her blood pressure with the goal of decreasing her risk of heart attack and stroke. [name] blood pressure is not currently controlled.  Pre-Diabetes [name] has a new diagnosis of prediabetes based on her last elevated Hgb A1c at 6.3 and was informed this puts her at greater risk of developing diabetes. She is not taking metformin currently and is attempting to improve with diet and exercise to decrease risk of diabetes. She is still eating too many simple carbohydrates which is hindering her progress. She denies nausea or hypoglycemia.  At risk for diabetes [name] is at higher than average risk for developing diabetes due to her prediabetes and obesity. She currently denies polyuria or polydipsia.   Wt Readings from Last 500 Encounters:  10/03/16 273 lb (123.8 kg)  09/14/16 274 lb (124.3 kg)  08/31/16 280 lb (127 kg)  08/21/16 285 lb (129.3 kg)  07/20/16 287 lb 12.8 oz (130.5 kg)  06/01/16 286 lb 14.4 oz (130.1 kg)  01/20/16 288 lb (130.6 kg)  10/27/15 286 lb (129.7 kg)    09/01/15 286 lb 12.8 oz (130.1 kg)  06/29/15 286 lb 12.8 oz (130.1 kg)  04/15/15 285 lb 1.6 oz (129.3 kg)  04/13/15 284 lb 9.6 oz (129.1 kg)  12/29/14 289 lb 14.5 oz (131.5 kg)  12/23/14 290 lb 12.8 oz (131.9 kg)  10/28/14 292 lb (132.5 kg)  08/13/14 295 lb 9.6 oz (134.1 kg)  02/06/14 295 lb 6.4 oz (134 kg)  12/11/13 294 lb 8 oz (133.6 kg)  06/12/13 284 lb 11.2 oz (129.1 kg)  02/25/13 292 lb 9.6 oz (132.7 kg)  02/11/13 286 lb 9.6 oz (130 kg)  02/05/13 290 lb 9.6 oz (131.8 kg)  12/09/12 294 lb (133.4 kg)  11/18/12 296 lb (134.3 kg)  10/11/12 294 lb (133.4 kg)  10/10/12 295 lb 14.4 oz (134.2 kg)  09/23/12 294 lb 12.8 oz (133.7 kg)  08/27/12 295 lb 3.2 oz (133.9 kg)  07/17/12 292 lb 14.4 oz (132.9 kg)  06/14/12 292 lb (132.5 kg)  06/03/12 290 lb 9.6 oz (131.8 kg)  05/27/12 292 lb 8 oz (132.7 kg)  04/15/12 294 lb 11.2 oz (133.7 kg)  01/17/12 298 lb 1.6 oz (135.2 kg)  12/27/11 294 lb 1.6 oz (133.4 kg)  12/07/11 292 lb (132.5 kg)  11/24/11 297 lb 12.8 oz (135.1 kg)  11/14/11 298 lb 8 oz (135.4 kg)  11/07/11 298 lb 3.2 oz (135.3 kg)  10/31/11 298 lb 6.4 oz (  135.4 kg)  10/24/11 294 lb (133.4 kg)  10/17/11 298 lb 3.2 oz (135.3 kg)  10/06/11 295 lb 6.4 oz (134 kg)  09/29/11 295 lb 4.8 oz (133.9 kg)  09/15/11 295 lb (133.8 kg)  09/11/11 294 lb 6.4 oz (133.5 kg)  09/11/11 294 lb 4.8 oz (133.5 kg)  09/05/11 296 lb 12.8 oz (134.6 kg)  08/10/11 299 lb 3.2 oz (135.7 kg)  04/04/11 298 lb 12.8 oz (135.5 kg)  01/30/11 (!) 303 lb 12.8 oz (137.8 kg)  10/15/07 (!) 317 lb (143.8 kg)  02/15/05 (!) 275 lb (124.7 kg)  09/23/07 (!) 304 lb (137.9 kg)     ALLERGIES: Allergies  Allergen Reactions  . Cymbalta [Duloxetine Hcl] Other (See Comments)    Stomach pain  . Gabapentin   . Lipitor [Atorvastatin] Other (See Comments)    Cramps  . Maxzide [Hydrochlorothiazide W-Triamterene] Other (See Comments)    Cramping   . Pravastatin Sodium Other (See Comments)    Headache   . Simvastatin  Other (See Comments)    Memory loss  . Lodine [Etodolac] Itching    MEDICATIONS: Current Outpatient Prescriptions on File Prior to Visit  Medication Sig Dispense Refill  . amitriptyline (ELAVIL) 25 MG tablet Take 25 mg by mouth at bedtime.    [name] aspirin 81 MG tablet Take 81 mg by mouth daily.    [name] escitalopram (LEXAPRO) 10 MG tablet Take 10 mg by mouth daily.    [name] HYDROcodone-acetaminophen (NORCO/VICODIN) 5-325 MG tablet Take 1-2 tablets by mouth every 6 (six) hours as needed. 10 tablet 0  . irbesartan (AVAPRO) 300 MG tablet Take 300 mg by mouth at bedtime.   0  . IRON PO Take 18 mg by mouth daily.    [name] ketoconazole (NIZORAL) 2 % cream Apply 1 application topically daily. 15 g 0  . niacin 500 MG CR capsule Take 500 mg by mouth 2 (two) times daily with a meal.    . Omega-3 Fatty Acids (FISH OIL) 1200 MG CAPS Take 1,200 mg by mouth 2 (two) times daily.    . ondansetron (ZOFRAN) 4 MG tablet Take 1 tablet (4 mg total) by mouth every 6 (six) hours. 12 tablet 0  . tamoxifen (NOLVADEX) 20 MG tablet Take 1 tablet (20 mg total) by mouth daily. 90 tablet 3  . traMADol (ULTRAM) 50 MG tablet take 1 tablet by mouth every 6 hours as needed for pain  0  . verapamil (VERELAN PM) 240 MG 24 hr capsule Take 240 mg by mouth daily.     . vitamin B-12 (CYANOCOBALAMIN) 1000 MCG tablet Take 1,000 mcg by mouth daily.    . Vitamin D, Cholecalciferol, 400 UNITS TABS Take 400 Units by mouth at bedtime.    . Vitamin D, Ergocalciferol, (DRISDOL) 50000 units CAPS capsule Take 1 capsule (50,000 Units total) by mouth every 7 (seven) days. 4 capsule 0   No current facility-administered medications on file prior to visit.     PAST MEDICAL HISTORY: Past Medical History:  Diagnosis Date  . Anemia   . Anxiety    takes Xanax daily  . Arthritis   . Back pain   . Blood transfusion   . Breast cancer ([place]) 2013   left breast  . Bronchitis    hx of  . Chronic fatigue syndrome   . Depression    takes Lexapro daily  .  Diabetes mellitus without complication ([place])    boderline  . Diverticulosis   . Edema  feet and legs  . Headache(784.0)   . History of colon polyps 1998   adenomatous  . Hyperlipidemia    takes Niacin daily  . Hypertension    takes Verapamil and Avapro daily  . Joint pain   . OSA (obstructive sleep apnea)   . Pneumonia 4/12   Albuterol daily as needed  . Radiation 10/29/11-11/24/11   left breast 6100 cGy  . Shortness of breath    with exertion  . Spinal stenosis   . Tinnitus     PAST SURGICAL HISTORY: Past Surgical History:  Procedure Laterality Date  . APPENDECTOMY  1978  . BACK SURGERY    . BELPHAROPTOSIS REPAIR    . BREAST LUMPECTOMY  08/2011   left  . BREAST LUMPECTOMY WITH NEEDLE LOCALIZATION Right 02/06/2013   Procedure: RIGHT BREAST LUMPECTOMY WITH NEEDLE LOCALIZATION;  Surgeon: Harl Bowie, MD;  Location: [facility];  Service: General;  Laterality: Right;  . COLONOSCOPY W/ POLYPECTOMY    . COLONOSCOPY WITH PROPOFOL N/A 10/27/2015   Procedure: COLONOSCOPY WITH PROPOFOL;  Surgeon: Mauri Pole, MD;  Location: [facility];  Service: Endoscopy;  Laterality: N/A;  . HARDWARE REMOVAL Left 04/20/2015   Procedure: HARDWARE REMOVAL LEFT LUMBAR FIVE SCREW;  Surgeon: Karie Chimera, MD;  Location: [facility];  Service: Neurosurgery;  Laterality: Left;  . JOINT REPLACEMENT Bilateral    bilateral knee  . POLYPECTOMY  1990's  . THYROID CYST EXCISION  1994  . TOTAL KNEE ARTHROPLASTY  2006   bilateral  . TRANSPHENOIDAL / TRANSNASAL HYPOPHYSECTOMY / RESECTION PITUITARY TUMOR  6/11    SOCIAL HISTORY: Social History  Substance Use Topics  . Smoking status: Never Smoker  . Smokeless tobacco: Never Used  . Alcohol use No    FAMILY HISTORY: Family History  Problem Relation Age of Onset  . Heart disease Father   . Clotting disorder Father   . Stroke Father   . Hypertension Father   . Heart Problems Father   . Stroke Mother   . Diabetes Mother   . Hypertension Mother    . Hyperlipidemia Mother   . Obesity Mother   . Cancer Sister 93    Breast Cancer  . Colon cancer Neg Hx   . Neuropathy Neg Hx     ROS: Review of Systems  Constitutional: Positive for weight loss.  Respiratory: Negative for shortness of breath (on exertion).   Cardiovascular: Negative for chest pain.  Gastrointestinal: Negative for nausea.  Genitourinary: Negative for frequency.  Endo/Heme/Allergies: Negative for polydipsia.       Negative hypoglycemia    PHYSICAL EXAM: Blood pressure (!) 162/78, pulse 68, temperature 98.7 F (37.1 C), temperature source Oral, height 5\' 6"  (1.676 m), weight 273 lb (123.8 kg), SpO2 100 %. Body mass index is 44.06 kg/m. Physical Exam  Constitutional: [name], place, and time. She appears well-developed and well-nourished.  Cardiovascular: Normal rate.   Pulmonary/Chest: Effort normal.  Musculoskeletal: Normal range of motion.  Neurological: [name], place, and time.  Skin: Skin is warm and dry.  Psychiatric: She has a normal mood and affect. Her behavior is normal.  Vitals reviewed.   RECENT LABS AND TESTS: BMET    Component Value Date/Time   NA 144 08/31/2016 1000   NA 140 06/01/2016 1143   K 4.7 08/31/2016 1000   K 4.0 06/01/2016 1143   CL 103 08/31/2016 1000   CL 105 12/09/2012 1423   CO2 23 08/31/2016 1000  CO2 26 06/01/2016 1143   GLUCOSE 103 (H) 08/31/2016 1000   GLUCOSE 139 (H) 08/21/2016 2027   GLUCOSE 121 06/01/2016 1143   GLUCOSE 177 (H) 12/09/2012 1423   BUN 16 08/31/2016 1000   BUN 12.5 06/01/2016 1143   CREATININE 1.10 (H) 08/31/2016 1000   CREATININE 1.1 06/01/2016 1143   CALCIUM 10.3 08/31/2016 1000   CALCIUM 10.1 06/01/2016 1143   GFRNONAA 50 (L) 08/31/2016 1000   GFRAA 58 (L) 08/31/2016 1000   Lab Results  Component Value Date   HGBA1C 6.3 (H) 08/31/2016   HGBA1C (H) 11/11/2010    6.7 (NOTE)                                                                        According to the ADA Clinical Practice Recommendations for 2011, when HbA1c is used as a screening test:   >=6.5%   Diagnostic of Diabetes Mellitus           (if abnormal result  is confirmed)  5.7-6.4%   Increased risk of developing Diabetes Mellitus  References:Diagnosis and Classification of Diabetes Mellitus,Diabetes [id number] 1):S62-S69 and Standards of Medical Care in         Diabetes - 2011,Diabetes Care,2011,34  (Suppl 1):S11-S61.   Lab Results  Component Value Date   INSULIN 23.5 08/31/2016   CBC    Component Value Date/Time   WBC 8.2 08/31/2016 1000   WBC 14.5 (H) 08/21/2016 2027   RBC 4.27 08/31/2016 1000   RBC 4.46 08/21/2016 2027   HGB 11.8 (L) 08/21/2016 2027   HGB 11.1 (L) 06/01/2016 1143   HCT 34.4 08/31/2016 1000   HCT 34.9 06/01/2016 1143   PLT 267 08/21/2016 2027   PLT 248 06/01/2016 1143   MCV 81 08/31/2016 1000   MCV 81.2 06/01/2016 1143   MCH 25.8 (L) 08/31/2016 1000   MCH 26.5 08/21/2016 2027   MCHC 32.0 08/31/2016 1000   MCHC 33.1 08/21/2016 2027   RDW 15.6 (H) 08/31/2016 1000   RDW 15.8 (H) 06/01/2016 1143   LYMPHSABS 1.7 08/31/2016 1000   LYMPHSABS 1.8 06/01/2016 1143   MONOABS 0.6 06/01/2016 1143   EOSABS 0.1 08/31/2016 1000   BASOSABS 0.0 08/31/2016 1000   BASOSABS 0.0 06/01/2016 1143   Iron/TIBC/Ferritin/ %Sat    Component Value Date/Time   FERRITIN 362 (H) 04/15/2015 1058   Lipid Panel     Component Value Date/Time   CHOL 219 (H) 08/31/2016 1000   TRIG 178 (H) 08/31/2016 1000   HDL 44 08/31/2016 1000   CHOLHDL 6.4 11/11/2010 0555   VLDL 17 11/11/2010 0555   LDLCALC 139 (H) 08/31/2016 1000   Hepatic Function Panel     Component Value Date/Time   PROT 7.6 08/31/2016 1000   PROT 7.6 06/01/2016 1143   ALBUMIN 4.2 08/31/2016 1000   ALBUMIN 3.2 (L) 06/01/2016 1143   AST 14 08/31/2016 1000   AST 11 06/01/2016 1143   ALT 10 08/31/2016 1000   ALT 12 06/01/2016 1143   ALKPHOS 66 08/31/2016 1000   ALKPHOS 69 06/01/2016 1143    BILITOT 0.2 08/31/2016 1000   BILITOT 0.28 06/01/2016 1143     ASSESSMENT AND PLAN: Essential hypertension - Plan: hydrochlorothiazide (HYDRODIURIL) 12.5 MG tablet  Prediabetes  At risk for diabetes mellitus  Morbid obesity ([place])  PLAN:  Hypertension We discussed sodium restriction, working on healthy weight loss, and a regular exercise program as the means to achieve improved blood pressure control. Leath agreed with this plan and agreed to follow up as directed. We will continue to monitor her blood pressure as well as her progress with the above lifestyle modifications. She will continue her medications as prescribed and she agrees to start to take HCTZ 12.5 mg qd #30 with no refills and will follow up in our clinic in 2 weeks to monitor closely and she will watch for signs of hypotension as she continues her lifestyle modifications.  Pre-Diabetes Lakeda will continue to work on weight loss, exercise, and decreasing simple carbohydrates in her diet to help decrease the risk of diabetes. She was informed that eating too many simple carbohydrates or too many calories at one sitting increases the likelihood of GI side effects.  Mihika agreed to follow up with [name] as directed to monitor her progress.  Diabetes risk counselling Majel was given extended (at least 30 minutes) diabetes prevention counseling today. She is 71 y.o. female and has risk factors for diabetes including obesity. We discussed intensive lifestyle modifications today with an emphasis on weight loss as well as increasing exercise and decreasing simple carbohydrates in her diet. We discussed healthy substitutions for meal plan and healthy choices and portion control when eating out.  Obesity Lanesia is currently in the action stage of change. As such, her goal is to continue with weight loss efforts She has agreed to follow the Category 2 plan Yafa has been instructed to work up to a goal of 150 minutes of combined cardio  and strengthening exercise per week for weight loss and overall health benefits. We discussed the following Behavioral Modification Stratagies today: no skipping meals, increasing lean protein intake, decreasing simple carbohydrates , increasing vegetables and work on meal [place] has agreed to follow up with our clinic in 2 weeks. She was informed of the importance of frequent follow up visits to maximize her success with intensive lifestyle modifications for her multiple health conditions.  I, Doreene Nest, am acting as scribe for Dennard Nip, MD  I have reviewed the above documentation for accuracy and completeness, and I agree with the above. -Dennard Nip, MD

## 2016-10-17 ENCOUNTER — Ambulatory Visit (INDEPENDENT_AMBULATORY_CARE_PROVIDER_SITE_OTHER): Payer: Medicare Other | Admitting: Family Medicine

## 2016-10-17 VITALS — BP 164/71 | HR 80 | Temp 98.7°F | Ht 66.0 in | Wt 269.0 lb

## 2016-10-17 DIAGNOSIS — Z9189 Other specified personal risk factors, not elsewhere classified: Secondary | ICD-10-CM | POA: Diagnosis not present

## 2016-10-17 DIAGNOSIS — R7303 Prediabetes: Secondary | ICD-10-CM

## 2016-10-17 DIAGNOSIS — E559 Vitamin D deficiency, unspecified: Secondary | ICD-10-CM | POA: Diagnosis not present

## 2016-10-17 DIAGNOSIS — Z6841 Body Mass Index (BMI) 40.0 and over, adult: Secondary | ICD-10-CM | POA: Diagnosis not present

## 2016-10-17 DIAGNOSIS — IMO0001 Reserved for inherently not codable concepts without codable children: Secondary | ICD-10-CM

## 2016-10-17 MED ORDER — VITAMIN D (ERGOCALCIFEROL) 1.25 MG (50000 UNIT) PO CAPS: 50000.0000 [IU] | ORAL_CAPSULE | ORAL | 0 refills | Status: DC

## 2016-10-17 NOTE — Progress Notes (Signed)
Office: [phone]  /  Fax: [phone]   HPI:   Chief Complaint: OBESITY [name] is here to discuss her progress with her obesity treatment plan. She is following her eating plan approximately 80 % of the time and states she is exercising 0 minutes 0 times per week. [name] continues to do well with weight loss but is starting to get bored with lunch especially. She is not ready to journal but would like for [name] to give her more lunch options. Her weight is 269 lb (122 kg) today and has had a weight loss of 4 pounds over a period of 2 weeks since her last visit. She has lost 11 lbs since starting treatment with [name].  Vitamin D deficiency [name] has a diagnosis of vitamin D deficiency. She is currently taking OTC vit D 1,000 IU daily but pharmacy error prevented her from starting vitamin d prescription. She still notes fatigue and denies nausea, vomiting or muscle weakness.  Pre-Diabetes [name] has a diagnosis of prediabetes based on her elevated Hgb A1c at 6.3 and was informed this puts her at greater risk of developing diabetes. She is not taking metformin currently and continues to work on diet and exercise to improve labs and decrease risk of diabetes. She denies nausea or hypoglycemia.   Wt Readings from Last 500 Encounters:  10/17/16 269 lb (122 kg)  10/03/16 273 lb (123.8 kg)  09/14/16 274 lb (124.3 kg)  08/31/16 280 lb (127 kg)  08/21/16 285 lb (129.3 kg)  07/20/16 287 lb 12.8 oz (130.5 kg)  06/01/16 286 lb 14.4 oz (130.1 kg)  01/20/16 288 lb (130.6 kg)  10/27/15 286 lb (129.7 kg)  09/01/15 286 lb 12.8 oz (130.1 kg)  06/29/15 286 lb 12.8 oz (130.1 kg)  04/15/15 285 lb 1.6 oz (129.3 kg)  04/13/15 284 lb 9.6 oz (129.1 kg)  12/29/14 289 lb 14.5 oz (131.5 kg)  12/23/14 290 lb 12.8 oz (131.9 kg)  10/28/14 292 lb (132.5 kg)  08/13/14 295 lb 9.6 oz (134.1 kg)  02/06/14 295 lb 6.4 oz (134 kg)  12/11/13 294 lb 8 oz (133.6 kg)  06/12/13 284 lb 11.2 oz (129.1 kg)  02/25/13 292 lb  9.6 oz (132.7 kg)  02/11/13 286 lb 9.6 oz (130 kg)  02/05/13 290 lb 9.6 oz (131.8 kg)  12/09/12 294 lb (133.4 kg)  11/18/12 296 lb (134.3 kg)  10/11/12 294 lb (133.4 kg)  10/10/12 295 lb 14.4 oz (134.2 kg)  09/23/12 294 lb 12.8 oz (133.7 kg)  08/27/12 295 lb 3.2 oz (133.9 kg)  07/17/12 292 lb 14.4 oz (132.9 kg)  06/14/12 292 lb (132.5 kg)  06/03/12 290 lb 9.6 oz (131.8 kg)  05/27/12 292 lb 8 oz (132.7 kg)  04/15/12 294 lb 11.2 oz (133.7 kg)  01/17/12 298 lb 1.6 oz (135.2 kg)  12/27/11 294 lb 1.6 oz (133.4 kg)  12/07/11 292 lb (132.5 kg)  11/24/11 297 lb 12.8 oz (135.1 kg)  11/14/11 298 lb 8 oz (135.4 kg)  11/07/11 298 lb 3.2 oz (135.3 kg)  10/31/11 298 lb 6.4 oz (135.4 kg)  10/24/11 294 lb (133.4 kg)  10/17/11 298 lb 3.2 oz (135.3 kg)  10/06/11 295 lb 6.4 oz (134 kg)  09/29/11 295 lb 4.8 oz (133.9 kg)  09/15/11 295 lb (133.8 kg)  09/11/11 294 lb 6.4 oz (133.5 kg)  09/11/11 294 lb 4.8 oz (133.5 kg)  09/05/11 296 lb 12.8 oz (134.6 kg)  08/10/11 299 lb 3.2 oz (135.7 kg)  04/04/11 298 lb 12.8 oz (135.5 kg)  01/30/11 (!) 303 lb 12.8 oz (137.8 kg)  10/15/07 (!) 317 lb (143.8 kg)  02/15/05 (!) 275 lb (124.7 kg)  09/23/07 (!) 304 lb (137.9 kg)     ALLERGIES: Allergies  Allergen Reactions  . Cymbalta [Duloxetine Hcl] Other (See Comments)    Stomach pain  . Gabapentin   . Lipitor [Atorvastatin] Other (See Comments)    Cramps  . Maxzide [Hydrochlorothiazide W-Triamterene] Other (See Comments)    Cramping   . Pravastatin Sodium Other (See Comments)    Headache   . Simvastatin Other (See Comments)    Memory loss  . Lodine [Etodolac] Itching    MEDICATIONS: Current Outpatient Prescriptions on File Prior to Visit  Medication Sig Dispense Refill  . amitriptyline (ELAVIL) 25 MG tablet Take 25 mg by mouth at bedtime.    [name] aspirin 81 MG tablet Take 81 mg by mouth daily.    [name] escitalopram (LEXAPRO) 10 MG tablet Take 10 mg by mouth daily.    . hydrochlorothiazide  (HYDRODIURIL) 12.5 MG tablet Take 1 tablet (12.5 mg total) by mouth daily. 30 tablet 0  . HYDROcodone-acetaminophen (NORCO/VICODIN) 5-325 MG tablet Take 1-2 tablets by mouth every 6 (six) hours as needed. 10 tablet 0  . irbesartan (AVAPRO) 300 MG tablet Take 300 mg by mouth at bedtime.   0  . IRON PO Take 18 mg by mouth daily.    [name] ketoconazole (NIZORAL) 2 % cream Apply 1 application topically daily. 15 g 0  . niacin 500 MG CR capsule Take 500 mg by mouth 2 (two) times daily with a meal.    . Omega-3 Fatty Acids (FISH OIL) 1200 MG CAPS Take 1,200 mg by mouth 2 (two) times daily.    . ondansetron (ZOFRAN) 4 MG tablet Take 1 tablet (4 mg total) by mouth every 6 (six) hours. 12 tablet 0  . tamoxifen (NOLVADEX) 20 MG tablet Take 1 tablet (20 mg total) by mouth daily. 90 tablet 3  . traMADol (ULTRAM) 50 MG tablet take 1 tablet by mouth every 6 hours as needed for pain  0  . verapamil (VERELAN PM) 240 MG 24 hr capsule Take 240 mg by mouth daily.     . vitamin B-12 (CYANOCOBALAMIN) 1000 MCG tablet Take 1,000 mcg by mouth daily.    . Vitamin D, Cholecalciferol, 400 UNITS TABS Take 400 Units by mouth at bedtime.     No current facility-administered medications on file prior to visit.     PAST MEDICAL HISTORY: Past Medical History:  Diagnosis Date  . Anemia   . Anxiety    takes Xanax daily  . Arthritis   . Back pain   . Blood transfusion   . Breast cancer ([place]) 2013   left breast  . Bronchitis    hx of  . Chronic fatigue syndrome   . Depression    takes Lexapro daily  . Diabetes mellitus without complication ([place])    boderline  . Diverticulosis   . Edema    feet and legs  . Headache(784.0)   . History of colon polyps 1998   adenomatous  . Hyperlipidemia    takes Niacin daily  . Hypertension    takes Verapamil and Avapro daily  . Joint pain   . OSA (obstructive sleep apnea)   . Pneumonia 4/12   Albuterol daily as needed  . Radiation 10/29/11-11/24/11   left breast 6100 cGy  .  Shortness of breath  with exertion  . Spinal stenosis   . Tinnitus     PAST SURGICAL HISTORY: Past Surgical History:  Procedure Laterality Date  . APPENDECTOMY  1978  . BACK SURGERY    . BELPHAROPTOSIS REPAIR    . BREAST LUMPECTOMY  08/2011   left  . BREAST LUMPECTOMY WITH NEEDLE LOCALIZATION Right 02/06/2013   Procedure: RIGHT BREAST LUMPECTOMY WITH NEEDLE LOCALIZATION;  Surgeon: Harl Bowie, MD;  Location: [facility];  Service: General;  Laterality: Right;  . COLONOSCOPY W/ POLYPECTOMY    . COLONOSCOPY WITH PROPOFOL N/A 10/27/2015   Procedure: COLONOSCOPY WITH PROPOFOL;  Surgeon: Mauri Pole, MD;  Location: [facility];  Service: Endoscopy;  Laterality: N/A;  . HARDWARE REMOVAL Left 04/20/2015   Procedure: HARDWARE REMOVAL LEFT LUMBAR FIVE SCREW;  Surgeon: Karie Chimera, MD;  Location: [facility];  Service: Neurosurgery;  Laterality: Left;  . JOINT REPLACEMENT Bilateral    bilateral knee  . POLYPECTOMY  1990's  . THYROID CYST EXCISION  1994  . TOTAL KNEE ARTHROPLASTY  2006   bilateral  . TRANSPHENOIDAL / TRANSNASAL HYPOPHYSECTOMY / RESECTION PITUITARY TUMOR  6/11    SOCIAL HISTORY: Social History  Substance Use Topics  . Smoking status: Never Smoker  . Smokeless tobacco: Never Used  . Alcohol use No    FAMILY HISTORY: Family History  Problem Relation Age of Onset  . Heart disease Father   . Clotting disorder Father   . Stroke Father   . Hypertension Father   . Heart Problems Father   . Stroke Mother   . Diabetes Mother   . Hypertension Mother   . Hyperlipidemia Mother   . Obesity Mother   . Cancer Sister 71    Breast Cancer  . Colon cancer Neg Hx   . Neuropathy Neg Hx     ROS: Review of Systems  Constitutional: Positive for malaise/fatigue and weight loss.  Gastrointestinal: Negative for nausea and vomiting.  Musculoskeletal:       Negative muscle weakness  Endo/Heme/Allergies:       Negative hypoglycemia    PHYSICAL EXAM: Blood pressure (!)  164/71, pulse 80, temperature 98.7 F (37.1 C), temperature source Oral, height 5\' 6"  (1.676 m), weight 269 lb (122 kg), SpO2 96 %. Body mass index is 43.42 kg/m. Physical Exam  Constitutional: [name], place, and time. She appears well-developed and well-nourished.  Cardiovascular: Normal rate.   Pulmonary/Chest: Effort normal.  Musculoskeletal: Normal range of motion.  Neurological: [name], place, and time.  Skin: Skin is warm and dry.  Psychiatric: She has a normal mood and affect. Her behavior is normal.  Vitals reviewed.   RECENT LABS AND TESTS: BMET    Component Value Date/Time   NA 144 08/31/2016 1000   NA 140 06/01/2016 1143   K 4.7 08/31/2016 1000   K 4.0 06/01/2016 1143   CL 103 08/31/2016 1000   CL 105 12/09/2012 1423   CO2 23 08/31/2016 1000   CO2 26 06/01/2016 1143   GLUCOSE 103 (H) 08/31/2016 1000   GLUCOSE 139 (H) 08/21/2016 2027   GLUCOSE 121 06/01/2016 1143   GLUCOSE 177 (H) 12/09/2012 1423   BUN 16 08/31/2016 1000   BUN 12.5 06/01/2016 1143   CREATININE 1.10 (H) 08/31/2016 1000   CREATININE 1.1 06/01/2016 1143   CALCIUM 10.3 08/31/2016 1000   CALCIUM 10.1 06/01/2016 1143   GFRNONAA 50 (L) 08/31/2016 1000   GFRAA 58 (L) 08/31/2016 1000   Lab  Results  Component Value Date   HGBA1C 6.3 (H) 08/31/2016   HGBA1C (H) 11/11/2010    6.7 (NOTE)                                                                       According to the ADA Clinical Practice Recommendations for 2011, when HbA1c is used as a screening test:   >=6.5%   Diagnostic of Diabetes Mellitus           (if abnormal result  is confirmed)  5.7-6.4%   Increased risk of developing Diabetes Mellitus  References:Diagnosis and Classification of Diabetes Mellitus,Diabetes JJOA,4166,06(TKZSW 1):S62-S69 and Standards of Medical Care in         Diabetes - 2011,Diabetes FUXN,2355,73  (Suppl 1):S11-S61.   Lab Results  Component Value Date   INSULIN 23.5 08/31/2016    CBC    Component Value Date/Time   WBC 8.2 08/31/2016 1000   WBC 14.5 (H) 08/21/2016 2027   RBC 4.27 08/31/2016 1000   RBC 4.46 08/21/2016 2027   HGB 11.8 (L) 08/21/2016 2027   HGB 11.1 (L) 06/01/2016 1143   HCT 34.4 08/31/2016 1000   HCT 34.9 06/01/2016 1143   PLT 267 08/21/2016 2027   PLT 248 06/01/2016 1143   MCV 81 08/31/2016 1000   MCV 81.2 06/01/2016 1143   MCH 25.8 (L) 08/31/2016 1000   MCH 26.5 08/21/2016 2027   MCHC 32.0 08/31/2016 1000   MCHC 33.1 08/21/2016 2027   RDW 15.6 (H) 08/31/2016 1000   RDW 15.8 (H) 06/01/2016 1143   LYMPHSABS 1.7 08/31/2016 1000   LYMPHSABS 1.8 06/01/2016 1143   MONOABS 0.6 06/01/2016 1143   EOSABS 0.1 08/31/2016 1000   BASOSABS 0.0 08/31/2016 1000   BASOSABS 0.0 06/01/2016 1143   Iron/TIBC/Ferritin/ %Sat    Component Value Date/Time   FERRITIN 362 (H) 04/15/2015 1058   Lipid Panel     Component Value Date/Time   CHOL 219 (H) 08/31/2016 1000   TRIG 178 (H) 08/31/2016 1000   HDL 44 08/31/2016 1000   CHOLHDL 6.4 11/11/2010 0555   VLDL 17 11/11/2010 0555   LDLCALC 139 (H) 08/31/2016 1000   Hepatic Function Panel     Component Value Date/Time   PROT 7.6 08/31/2016 1000   PROT 7.6 06/01/2016 1143   ALBUMIN 4.2 08/31/2016 1000   ALBUMIN 3.2 (L) 06/01/2016 1143   AST 14 08/31/2016 1000   AST 11 06/01/2016 1143   ALT 10 08/31/2016 1000   ALT 12 06/01/2016 1143   ALKPHOS 66 08/31/2016 1000   ALKPHOS 69 06/01/2016 1143   BILITOT 0.2 08/31/2016 1000   BILITOT 0.28 06/01/2016 1143     ASSESSMENT AND PLAN: Vitamin D deficiency - Plan: Vitamin D, Ergocalciferol, (DRISDOL) 50000 units CAPS capsule  Prediabetes  Morbid obesity ([place])  PLAN:  Vitamin D Deficiency [name] was informed that low vitamin D levels contributes to fatigue and are associated with obesity, breast, and colon cancer. She agrees to start to take prescription Vit D @50 ,000 IU every week #4 with no refills and we will re-check labs in 1 month and will  follow up for routine testing of vitamin D, at least 2-3 times per year. She was informed of the risk of over-replacement  of vitamin D and agrees to not increase her dose unless he discusses this with [name] first.  Pre-Diabetes [name] will continue to work on weight loss, exercise, and decreasing simple carbohydrates in her diet to help decrease the risk of diabetes. We dicussed metformin including benefits and risks. She was informed that eating too many simple carbohydrates or too many calories at one sitting increases the likelihood of GI side effects. 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 [name] agreed to follow up with [name] as directed to monitor her progress.  Diabetes risk counselling [name] was given extended (at least 15 minutes) diabetes prevention counseling today. She is 71 y.o. female and has risk factors for diabetes including obesity. We discussed intensive lifestyle modifications today with an emphasis on weight loss as well as increasing exercise and decreasing simple carbohydrates in her diet.  Obesity [name] is currently in the action stage of change. As such, her goal is to continue with weight loss efforts She has agreed to follow the Category 2 plan [name] has been instructed to work up to a goal of 150 minutes of combined cardio and strengthening exercise per week for weight loss and overall health benefits. We discussed the following Behavioral Modification Stratagies today: no skipping meals, increasing lean protein intake and work on meal planning and easy cooking plans  [name] has agreed to follow up with our clinic in 2 weeks. She was informed of the importance of frequent follow up visits to maximize her success with intensive lifestyle modifications for her multiple health conditions.  I, Doreene Nest, am acting as scribe for Dennard Nip, MD  I have reviewed the above documentation for accuracy and completeness, and I agree with the above. -Dennard Nip, MD

## 2016-10-31 ENCOUNTER — Ambulatory Visit (INDEPENDENT_AMBULATORY_CARE_PROVIDER_SITE_OTHER): Payer: Medicare Other | Admitting: Family Medicine

## 2016-10-31 VITALS — BP 132/72 | HR 85 | Temp 98.3°F | Ht 66.0 in | Wt 265.0 lb

## 2016-10-31 DIAGNOSIS — I10 Essential (primary) hypertension: Secondary | ICD-10-CM

## 2016-10-31 DIAGNOSIS — E559 Vitamin D deficiency, unspecified: Secondary | ICD-10-CM

## 2016-10-31 MED ORDER — VITAMIN D (ERGOCALCIFEROL) 1.25 MG (50000 UNIT) PO CAPS: 50000.0000 [IU] | ORAL_CAPSULE | ORAL | 0 refills | Status: DC

## 2016-10-31 MED ORDER — VITAMIN D (ERGOCALCIFEROL) 1.25 MG (50000 UNIT) PO CAPS
50000.0000 [IU] | ORAL_CAPSULE | ORAL | 0 refills | Status: DC
Start: 2016-10-31 — End: 2016-10-31

## 2016-10-31 NOTE — Progress Notes (Signed)
Office: [phone]  /  Fax: [phone]   HPI:   Chief Complaint: OBESITY [name] is here to discuss her progress with her obesity treatment plan. She is following her eating plan approximately 95 % of the time and states she is exercising 0 minutes 0 times per week. [name] is doing well with weight loss. She is working on Print[employer] protein and decreasing snacking. Her hunger is well controlled. Her weight is 265 lb (120.2 kg) today and has had a weight loss of 4 pounds over a period of 2 weeks since her last visit. She has lost 15 lbs since starting treatment with [name].  Hypertension [name] is a 71 y.o. female with hypertension.  [name] is improved with diet and weight loss, is now at goal for the first time with weight loss. [name] denies headache, lightheadedness, chest pain or shortness of breath on exertion. She is working weight loss to help control her [name] with the goal of decreasing her risk of heart attack and stroke. [name] is currently controlled.  Vitamin D deficiency [name] has a diagnosis of vitamin D deficiency. She is currently stable on vit D and denies nausea, vomiting or muscle weakness.  Wt Readings from Last 500 Encounters:  10/31/16 265 lb (120.2 kg)  10/17/16 269 lb (122 kg)  10/03/16 273 lb (123.8 kg)  09/14/16 274 lb (124.3 kg)  08/31/16 280 lb (127 kg)  08/21/16 285 lb (129.3 kg)  07/20/16 287 lb 12.8 oz (130.5 kg)  06/01/16 286 lb 14.4 oz (130.1 kg)  01/20/16 288 lb (130.6 kg)  10/27/15 286 lb (129.7 kg)  09/01/15 286 lb 12.8 oz (130.1 kg)  06/29/15 286 lb 12.8 oz (130.1 kg)  04/15/15 285 lb 1.6 oz (129.3 kg)  04/13/15 284 lb 9.6 oz (129.1 kg)  12/29/14 289 lb 14.5 oz (131.5 kg)  12/23/14 290 lb 12.8 oz (131.9 kg)  10/28/14 292 lb (132.5 kg)  08/13/14 295 lb 9.6 oz (134.1 kg)  02/06/14 295 lb 6.4 oz (134 kg)  12/11/13 294 lb 8 oz (133.6 kg)  06/12/13 284 lb 11.2 oz (129.1 kg)  02/25/13 292 lb  9.6 oz (132.7 kg)  02/11/13 286 lb 9.6 oz (130 kg)  02/05/13 290 lb 9.6 oz (131.8 kg)  12/09/12 294 lb (133.4 kg)  11/18/12 296 lb (134.3 kg)  10/11/12 294 lb (133.4 kg)  10/10/12 295 lb 14.4 oz (134.2 kg)  09/23/12 294 lb 12.8 oz (133.7 kg)  08/27/12 295 lb 3.2 oz (133.9 kg)  07/17/12 292 lb 14.4 oz (132.9 kg)  06/14/12 292 lb (132.5 kg)  06/03/12 290 lb 9.6 oz (131.8 kg)  05/27/12 292 lb 8 oz (132.7 kg)  04/15/12 294 lb 11.2 oz (133.7 kg)  01/17/12 298 lb 1.6 oz (135.2 kg)  12/27/11 294 lb 1.6 oz (133.4 kg)  12/07/11 292 lb (132.5 kg)  11/24/11 297 lb 12.8 oz (135.1 kg)  11/14/11 298 lb 8 oz (135.4 kg)  11/07/11 298 lb 3.2 oz (135.3 kg)  10/31/11 298 lb 6.4 oz (135.4 kg)  10/24/11 294 lb (133.4 kg)  10/17/11 298 lb 3.2 oz (135.3 kg)  10/06/11 295 lb 6.4 oz (134 kg)  09/29/11 295 lb 4.8 oz (133.9 kg)  09/15/11 295 lb (133.8 kg)  09/11/11 294 lb 6.4 oz (133.5 kg)  09/11/11 294 lb 4.8 oz (133.5 kg)  09/05/11 296 lb 12.8 oz (134.6 kg)  08/10/11 299 lb 3.2 oz (135.7 kg)  04/04/11 298 lb 12.8 oz (  135.5 kg)  01/30/11 (!) 303 lb 12.8 oz (137.8 kg)  10/15/07 (!) 317 lb (143.8 kg)  02/15/05 (!) 275 lb (124.7 kg)  09/23/07 (!) 304 lb (137.9 kg)     ALLERGIES: Allergies  Allergen Reactions  . Cymbalta [Duloxetine Hcl] Other (See Comments)    Stomach pain  . Gabapentin   . Lipitor [Atorvastatin] Other (See Comments)    Cramps  . Maxzide [Hydrochlorothiazide W-Triamterene] Other (See Comments)    Cramping   . Pravastatin Sodium Other (See Comments)    Headache   . Simvastatin Other (See Comments)    Memory loss  . Lodine [Etodolac] Itching    MEDICATIONS: Current Outpatient Prescriptions on File Prior to Visit  Medication Sig Dispense Refill  . amitriptyline (ELAVIL) 25 MG tablet Take 25 mg by mouth at bedtime.    [name] aspirin 81 MG tablet Take 81 mg by mouth daily.    [name] escitalopram (LEXAPRO) 10 MG tablet Take 10 mg by mouth daily.    . hydrochlorothiazide  (HYDRODIURIL) 12.5 MG tablet Take 1 tablet (12.5 mg total) by mouth daily. 30 tablet 0  . irbesartan (AVAPRO) 300 MG tablet Take 300 mg by mouth at bedtime.   0  . ketoconazole (NIZORAL) 2 % cream Apply 1 application topically daily. 15 g 0  . niacin 500 MG CR capsule Take 500 mg by mouth 2 (two) times daily with a meal.    . Omega-3 Fatty Acids (FISH OIL) 1200 MG CAPS Take 1,200 mg by mouth 2 (two) times daily.    . ondansetron (ZOFRAN) 4 MG tablet Take 1 tablet (4 mg total) by mouth every 6 (six) hours. 12 tablet 0  . tamoxifen (NOLVADEX) 20 MG tablet Take 1 tablet (20 mg total) by mouth daily. 90 tablet 3  . verapamil (VERELAN PM) 240 MG 24 hr capsule Take 240 mg by mouth daily.     . vitamin B-12 (CYANOCOBALAMIN) 1000 MCG tablet Take 1,000 mcg by mouth daily.    . Vitamin D, Cholecalciferol, 400 UNITS TABS Take 400 Units by mouth at bedtime.     No current facility-administered medications on file prior to visit.     PAST MEDICAL HISTORY: Past Medical History:  Diagnosis Date  . Anemia   . Anxiety    takes Xanax daily  . Arthritis   . Back pain   . [name] transfusion   . Breast cancer ([name]) 2013   left breast  . Bronchitis    hx of  . Chronic fatigue syndrome   . Depression    takes Lexapro daily  . Diabetes mellitus without complication ([name])    boderline  . Diverticulosis   . Edema    feet and legs  . Headache(784.0)   . History of colon polyps 1998   adenomatous  . Hyperlipidemia    takes Niacin daily  . Hypertension    takes Verapamil and Avapro daily  . Joint pain   . OSA (obstructive sleep apnea)   . Pneumonia 4/12   Albuterol daily as needed  . Radiation 10/29/11-11/24/11   left breast 6100 cGy  . Shortness of breath    with exertion  . Spinal stenosis   . Tinnitus     PAST SURGICAL HISTORY: Past Surgical History:  Procedure Laterality Date  . APPENDECTOMY  1978  . BACK SURGERY    . BELPHAROPTOSIS REPAIR    . BREAST LUMPECTOMY  08/2011   left  .  BREAST LUMPECTOMY WITH NEEDLE LOCALIZATION Right  02/06/2013   Procedure: RIGHT BREAST LUMPECTOMY WITH NEEDLE LOCALIZATION;  Surgeon: Harl Bowie, MD;  Location: [facility];  Service: General;  Laterality: Right;  . COLONOSCOPY W/ POLYPECTOMY    . COLONOSCOPY WITH PROPOFOL N/A 10/27/2015   Procedure: COLONOSCOPY WITH PROPOFOL;  Surgeon: Mauri Pole, MD;  Location: [facility];  Service: Endoscopy;  Laterality: N/A;  . HARDWARE REMOVAL Left 04/20/2015   Procedure: HARDWARE REMOVAL LEFT LUMBAR FIVE SCREW;  Surgeon: Karie Chimera, MD;  Location: [facility];  Service: Neurosurgery;  Laterality: Left;  . JOINT REPLACEMENT Bilateral    bilateral knee  . POLYPECTOMY  1990's  . THYROID CYST EXCISION  1994  . TOTAL KNEE ARTHROPLASTY  2006   bilateral  . TRANSPHENOIDAL / TRANSNASAL HYPOPHYSECTOMY / RESECTION PITUITARY TUMOR  6/11    SOCIAL HISTORY: Social History  Substance Use Topics  . Smoking status: Never Smoker  . Smokeless tobacco: Never Used  . Alcohol use No    FAMILY HISTORY: Family History  Problem Relation Age of Onset  . Heart disease Father   . Clotting disorder Father   . Stroke Father   . Hypertension Father   . Heart Problems Father   . Stroke Mother   . Diabetes Mother   . Hypertension Mother   . Hyperlipidemia Mother   . Obesity Mother   . Cancer Sister 95    Breast Cancer  . Colon cancer Neg Hx   . Neuropathy Neg Hx     ROS: Review of Systems  Constitutional: Positive for weight loss.  Respiratory: Negative for shortness of breath (with exertion).   Cardiovascular: Negative for chest pain.       Negative lightheadedness  Gastrointestinal: Negative for nausea and vomiting.  Musculoskeletal:       Negative muscle weakness  Neurological: Negative for headaches.    PHYSICAL EXAM: [name] 132/72, pulse 85, temperature 98.3 F (36.8 C), temperature source Oral, height 5\' 6"  (1.676 m), weight 265 lb (120.2 kg), SpO2 97 %. Body mass index is 42.77  kg/m. Physical Exam  Constitutional: [name], place, and time. She appears well-developed and well-nourished.  Cardiovascular: Normal rate.   Pulmonary/Chest: Effort normal.  Musculoskeletal: Normal range of motion.  Neurological: [name], place, and time.  Skin: Skin is warm and dry.  Psychiatric: She has a normal mood and affect. Her behavior is normal.  Vitals reviewed.   RECENT LABS AND TESTS: BMET    Component Value Date/Time   NA 144 08/31/2016 1000   NA 140 06/01/2016 1143   K 4.7 08/31/2016 1000   K 4.0 06/01/2016 1143   CL 103 08/31/2016 1000   CL 105 12/09/2012 1423   CO2 23 08/31/2016 1000   CO2 26 06/01/2016 1143   GLUCOSE 103 (H) 08/31/2016 1000   GLUCOSE 139 (H) 08/21/2016 2027   GLUCOSE 121 06/01/2016 1143   GLUCOSE 177 (H) 12/09/2012 1423   BUN 16 08/31/2016 1000   BUN 12.5 06/01/2016 1143   CREATININE 1.10 (H) 08/31/2016 1000   CREATININE 1.1 06/01/2016 1143   CALCIUM 10.3 08/31/2016 1000   CALCIUM 10.1 06/01/2016 1143   GFRNONAA 50 (L) 08/31/2016 1000   GFRAA 58 (L) 08/31/2016 1000   Lab Results  Component Value Date   HGBA1C 6.3 (H) 08/31/2016   HGBA1C (H) 11/11/2010    6.7 (NOTE)  According to the ADA Clinical Practice Recommendations for 2011, when HbA1c is used as a screening test:   >=6.5%   Diagnostic of Diabetes Mellitus           (if abnormal result  is confirmed)  5.7-6.4%   Increased risk of developing Diabetes Mellitus  References:Diagnosis and Classification of Diabetes Mellitus,Diabetes ELFY,1017,51(WCHEN 1):S62-S69 and Standards of Medical Care in         Diabetes - 2011,Diabetes IDPO,2423,53  (Suppl 1):S11-S61.   Lab Results  Component Value Date   INSULIN 23.5 08/31/2016   CBC    Component Value Date/Time   WBC 8.2 08/31/2016 1000   WBC 14.5 (H) 08/21/2016 2027   RBC 4.27 08/31/2016 1000   RBC 4.46 08/21/2016 2027   HGB  11.8 (L) 08/21/2016 2027   HGB 11.1 (L) 06/01/2016 1143   HCT 34.4 08/31/2016 1000   HCT 34.9 06/01/2016 1143   PLT 267 08/21/2016 2027   PLT 248 06/01/2016 1143   MCV 81 08/31/2016 1000   MCV 81.2 06/01/2016 1143   MCH 25.8 (L) 08/31/2016 1000   MCH 26.5 08/21/2016 2027   MCHC 32.0 08/31/2016 1000   MCHC 33.1 08/21/2016 2027   RDW 15.6 (H) 08/31/2016 1000   RDW 15.8 (H) 06/01/2016 1143   LYMPHSABS 1.7 08/31/2016 1000   LYMPHSABS 1.8 06/01/2016 1143   MONOABS 0.6 06/01/2016 1143   EOSABS 0.1 08/31/2016 1000   BASOSABS 0.0 08/31/2016 1000   BASOSABS 0.0 06/01/2016 1143   Iron/TIBC/Ferritin/ %Sat    Component Value Date/Time   FERRITIN 362 (H) 04/15/2015 1058   Lipid Panel     Component Value Date/Time   CHOL 219 (H) 08/31/2016 1000   TRIG 178 (H) 08/31/2016 1000   HDL 44 08/31/2016 1000   CHOLHDL 6.4 11/11/2010 0555   VLDL 17 11/11/2010 0555   LDLCALC 139 (H) 08/31/2016 1000   Hepatic Function Panel     Component Value Date/Time   PROT 7.6 08/31/2016 1000   PROT 7.6 06/01/2016 1143   ALBUMIN 4.2 08/31/2016 1000   ALBUMIN 3.2 (L) 06/01/2016 1143   AST 14 08/31/2016 1000   AST 11 06/01/2016 1143   ALT 10 08/31/2016 1000   ALT 12 06/01/2016 1143   ALKPHOS 66 08/31/2016 1000   ALKPHOS 69 06/01/2016 1143   BILITOT 0.2 08/31/2016 1000   BILITOT 0.28 06/01/2016 1143     ASSESSMENT AND PLAN: Essential hypertension  Vitamin D deficiency - Plan: Vitamin D, Ergocalciferol, (DRISDOL) 50000 units CAPS capsule, DISCONTINUED: Vitamin D, Ergocalciferol, (DRISDOL) 50000 units CAPS capsule  Morbid obesity ([place])  PLAN:  Hypertension We discussed sodium restriction, working on healthy weight loss, and a regular exercise program as the means to achieve improved [name] control. [name] agreed with this plan and agreed to follow up as directed. We will continue to monitor her [name] as well as her progress with the above lifestyle modifications. She will continue  her medications as prescribed and will watch for signs of hypotension as she continues her lifestyle modifications. We may need to decrease medications if she continues to lose weight.  Vitamin D Deficiency [name] was informed that low vitamin D levels contributes to fatigue and are associated with obesity, breast, and colon cancer. She agrees to continue to take prescription Vit D @50 ,000 IU every week, we will refill for 1 month and will follow up for routine testing of vitamin D, at least 2-3 times per year. She was informed of the risk of over-replacement  of vitamin D and agrees to not increase her dose unless he discusses this with [name] first.  Obesity [name] is currently in the action stage of change. As such, her goal is to continue with weight loss efforts She has agreed to keep a food journal with 350 to 500 calories and 30 grams of protein at lunch daily and follow the Category 2 plan [name] has been instructed to work up to a goal of 150 minutes of combined cardio and strengthening exercise per week for weight loss and overall health benefits. We discussed the following Behavioral Modification Stratagies today: increasing lean protein intake, increasing lower sugar fruits, work on meal planning and easy cooking plans and decrease liquid calories  [name] has agreed to follow up with our clinic in 2 weeks. She was informed of the importance of frequent follow up visits to maximize her success with intensive lifestyle modifications for her multiple health conditions.  I, Doreene Nest, am acting as scribe for Dennard Nip, MD  I have reviewed the above documentation for accuracy and completeness, and I agree with the above. -Dennard Nip, MD

## 2016-11-04 ENCOUNTER — Other Ambulatory Visit: Payer: Self-pay | Admitting: Oncology

## 2016-11-09 DIAGNOSIS — H25013 Cortical age-related cataract, bilateral: Secondary | ICD-10-CM | POA: Diagnosis not present

## 2016-11-09 DIAGNOSIS — H2513 Age-related nuclear cataract, bilateral: Secondary | ICD-10-CM | POA: Diagnosis not present

## 2016-11-09 DIAGNOSIS — H35033 Hypertensive retinopathy, bilateral: Secondary | ICD-10-CM | POA: Diagnosis not present

## 2016-11-09 DIAGNOSIS — H2512 Age-related nuclear cataract, left eye: Secondary | ICD-10-CM | POA: Diagnosis not present

## 2016-11-09 DIAGNOSIS — H353132 Nonexudative age-related macular degeneration, bilateral, intermediate dry stage: Secondary | ICD-10-CM | POA: Diagnosis not present

## 2016-11-16 ENCOUNTER — Ambulatory Visit (INDEPENDENT_AMBULATORY_CARE_PROVIDER_SITE_OTHER): Payer: Medicare Other | Admitting: Family Medicine

## 2016-11-16 VITALS — BP 152/84 | HR 103 | Temp 99.2°F | Ht 66.0 in | Wt 263.0 lb

## 2016-11-16 DIAGNOSIS — M7502 Adhesive capsulitis of left shoulder: Secondary | ICD-10-CM | POA: Diagnosis not present

## 2016-11-16 DIAGNOSIS — Z6841 Body Mass Index (BMI) 40.0 and over, adult: Secondary | ICD-10-CM

## 2016-11-16 DIAGNOSIS — IMO0001 Reserved for inherently not codable concepts without codable children: Secondary | ICD-10-CM

## 2016-11-16 DIAGNOSIS — I10 Essential (primary) hypertension: Secondary | ICD-10-CM | POA: Diagnosis not present

## 2016-11-16 MED ORDER — HYDROCHLOROTHIAZIDE 12.5 MG PO TABS: 12.5000 mg | ORAL_TABLET | Freq: Every day | ORAL | 0 refills | Status: DC

## 2016-11-16 NOTE — Progress Notes (Signed)
Office: [phone]  /  Fax: [phone]   HPI:   Chief Complaint: OBESITY [name] is here to discuss her progress with her obesity treatment plan. She is following her eating plan approximately 95 % of the time and states she is exercising 0 minutes 0 times per week. [name] continues to do well with weight loss. She states hunger is controlled, but still deviating from the plan more. She is not exercising due to hip and knee pain, walking with a cane. Her weight is 263 lb (119.3 kg) today and has had a weight loss of 2 pounds over a period of 2 weeks since her last visit. She has lost 17 lbs since starting treatment with [name].  Hypertension [name] is a 71 y.o. female with hypertension. She is out of HCTZ and blood pressure is elevated again. [name] denies chest pain or shortness of breath on exertion. She is working weight loss to help control her blood pressure with the goal of decreasing her risk of heart attack and stroke. [name] blood pressure is not currently controlled.  Left Adhesive Capsulitis [name] notes pain in left shoulder and is now unable to raise her left arm to 90 degrees. She has never done physical therapy but takes 2 Tylenol at night.  Wt Readings from Last 500 Encounters:  11/16/16 263 lb (119.3 kg)  10/31/16 265 lb (120.2 kg)  10/17/16 269 lb (122 kg)  10/03/16 273 lb (123.8 kg)  09/14/16 274 lb (124.3 kg)  08/31/16 280 lb (127 kg)  08/21/16 285 lb (129.3 kg)  07/20/16 287 lb 12.8 oz (130.5 kg)  06/01/16 286 lb 14.4 oz (130.1 kg)  01/20/16 288 lb (130.6 kg)  10/27/15 286 lb (129.7 kg)  09/01/15 286 lb 12.8 oz (130.1 kg)  06/29/15 286 lb 12.8 oz (130.1 kg)  04/15/15 285 lb 1.6 oz (129.3 kg)  04/13/15 284 lb 9.6 oz (129.1 kg)  12/29/14 289 lb 14.5 oz (131.5 kg)  12/23/14 290 lb 12.8 oz (131.9 kg)  10/28/14 292 lb (132.5 kg)  08/13/14 295 lb 9.6 oz (134.1 kg)  02/06/14 295 lb 6.4 oz (134 kg)  12/11/13 294 lb 8 oz (133.6 kg)  06/12/13 284  lb 11.2 oz (129.1 kg)  02/25/13 292 lb 9.6 oz (132.7 kg)  02/11/13 286 lb 9.6 oz (130 kg)  02/05/13 290 lb 9.6 oz (131.8 kg)  12/09/12 294 lb (133.4 kg)  11/18/12 296 lb (134.3 kg)  10/11/12 294 lb (133.4 kg)  10/10/12 295 lb 14.4 oz (134.2 kg)  09/23/12 294 lb 12.8 oz (133.7 kg)  08/27/12 295 lb 3.2 oz (133.9 kg)  07/17/12 292 lb 14.4 oz (132.9 kg)  06/14/12 292 lb (132.5 kg)  06/03/12 290 lb 9.6 oz (131.8 kg)  05/27/12 292 lb 8 oz (132.7 kg)  04/15/12 294 lb 11.2 oz (133.7 kg)  01/17/12 298 lb 1.6 oz (135.2 kg)  12/27/11 294 lb 1.6 oz (133.4 kg)  12/07/11 292 lb (132.5 kg)  11/24/11 297 lb 12.8 oz (135.1 kg)  11/14/11 298 lb 8 oz (135.4 kg)  11/07/11 298 lb 3.2 oz (135.3 kg)  10/31/11 298 lb 6.4 oz (135.4 kg)  10/24/11 294 lb (133.4 kg)  10/17/11 298 lb 3.2 oz (135.3 kg)  10/06/11 295 lb 6.4 oz (134 kg)  09/29/11 295 lb 4.8 oz (133.9 kg)  09/15/11 295 lb (133.8 kg)  09/11/11 294 lb 6.4 oz (133.5 kg)  09/11/11 294 lb 4.8 oz (133.5 kg)  09/05/11 296 lb 12.8 oz (134.6  kg)  08/10/11 299 lb 3.2 oz (135.7 kg)  04/04/11 298 lb 12.8 oz (135.5 kg)  01/30/11 (!) 303 lb 12.8 oz (137.8 kg)  10/15/07 (!) 317 lb (143.8 kg)  02/15/05 (!) 275 lb (124.7 kg)  09/23/07 (!) 304 lb (137.9 kg)     ALLERGIES: Allergies  Allergen Reactions  . Cymbalta [Duloxetine Hcl] Other (See Comments)    Stomach pain  . Gabapentin   . Lipitor [Atorvastatin] Other (See Comments)    Cramps  . Maxzide [Hydrochlorothiazide W-Triamterene] Other (See Comments)    Cramping   . Pravastatin Sodium Other (See Comments)    Headache   . Simvastatin Other (See Comments)    Memory loss  . Lodine [Etodolac] Itching    MEDICATIONS: Current Outpatient Prescriptions on File Prior to Visit  Medication Sig Dispense Refill  . amitriptyline (ELAVIL) 25 MG tablet Take 25 mg by mouth at bedtime.    [name] aspirin 81 MG tablet Take 81 mg by mouth daily.    [name] escitalopram (LEXAPRO) 10 MG tablet Take 10 mg by mouth  daily.    . irbesartan (AVAPRO) 300 MG tablet Take 300 mg by mouth at bedtime.   0  . ketoconazole (NIZORAL) 2 % cream Apply 1 application topically daily. 15 g 0  . niacin 500 MG CR capsule Take 500 mg by mouth 2 (two) times daily with a meal.    . Omega-3 Fatty Acids (FISH OIL) 1200 MG CAPS Take 1,200 mg by mouth 2 (two) times daily.    . ondansetron (ZOFRAN) 4 MG tablet Take 1 tablet (4 mg total) by mouth every 6 (six) hours. 12 tablet 0  . tamoxifen (NOLVADEX) 20 MG tablet TAKE 1 TABLET BY MOUTH  DAILY 90 tablet 0  . verapamil (VERELAN PM) 240 MG 24 hr capsule Take 240 mg by mouth daily.     . vitamin B-12 (CYANOCOBALAMIN) 1000 MCG tablet Take 1,000 mcg by mouth daily.    . Vitamin D, Cholecalciferol, 400 UNITS TABS Take 400 Units by mouth at bedtime.    . Vitamin D, Ergocalciferol, (DRISDOL) 50000 units CAPS capsule Take 1 capsule (50,000 Units total) by mouth every 7 (seven) days. 4 capsule 0   No current facility-administered medications on file prior to visit.     PAST MEDICAL HISTORY: Past Medical History:  Diagnosis Date  . Anemia   . Anxiety    takes Xanax daily  . Arthritis   . Back pain   . Blood transfusion   . Breast cancer ([place]) 2013   left breast  . Bronchitis    hx of  . Chronic fatigue syndrome   . Depression    takes Lexapro daily  . Diabetes mellitus without complication ([place])    boderline  . Diverticulosis   . Edema    feet and legs  . Headache(784.0)   . History of colon polyps 1998   adenomatous  . Hyperlipidemia    takes Niacin daily  . Hypertension    takes Verapamil and Avapro daily  . Joint pain   . OSA (obstructive sleep apnea)   . Pneumonia 4/12   Albuterol daily as needed  . Radiation 10/29/11-11/24/11   left breast 6100 cGy  . Shortness of breath    with exertion  . Spinal stenosis   . Tinnitus     PAST SURGICAL HISTORY: Past Surgical History:  Procedure Laterality Date  . APPENDECTOMY  1978  . BACK SURGERY    . BELPHAROPTOSIS  REPAIR    .  BREAST LUMPECTOMY  08/2011   left  . BREAST LUMPECTOMY WITH NEEDLE LOCALIZATION Right 02/06/2013   Procedure: RIGHT BREAST LUMPECTOMY WITH NEEDLE LOCALIZATION;  Surgeon: Harl Bowie, MD;  Location: [facility];  Service: General;  Laterality: Right;  . COLONOSCOPY W/ POLYPECTOMY    . COLONOSCOPY WITH PROPOFOL N/A 10/27/2015   Procedure: COLONOSCOPY WITH PROPOFOL;  Surgeon: Mauri Pole, MD;  Location: [facility];  Service: Endoscopy;  Laterality: N/A;  . HARDWARE REMOVAL Left 04/20/2015   Procedure: HARDWARE REMOVAL LEFT LUMBAR FIVE SCREW;  Surgeon: Karie Chimera, MD;  Location: [facility];  Service: Neurosurgery;  Laterality: Left;  . JOINT REPLACEMENT Bilateral    bilateral knee  . POLYPECTOMY  1990's  . THYROID CYST EXCISION  1994  . TOTAL KNEE ARTHROPLASTY  2006   bilateral  . TRANSPHENOIDAL / TRANSNASAL HYPOPHYSECTOMY / RESECTION PITUITARY TUMOR  6/11    SOCIAL HISTORY: Social History  Substance Use Topics  . Smoking status: Never Smoker  . Smokeless tobacco: Never Used  . Alcohol use No    FAMILY HISTORY: Family History  Problem Relation Age of Onset  . Heart disease Father   . Clotting disorder Father   . Stroke Father   . Hypertension Father   . Heart Problems Father   . Stroke Mother   . Diabetes Mother   . Hypertension Mother   . Hyperlipidemia Mother   . Obesity Mother   . Cancer Sister 81    Breast Cancer  . Colon cancer Neg Hx   . Neuropathy Neg Hx     ROS: Review of Systems  Constitutional: Positive for weight loss.  Respiratory: Negative for shortness of breath (on exertion).   Cardiovascular: Negative for chest pain.  Musculoskeletal:       Left shoulder pain    PHYSICAL EXAM: Blood pressure (!) 152/84, pulse (!) 103, temperature 99.2 F (37.3 C), temperature source Oral, height 5\' 6"  (1.676 m), weight 263 lb (119.3 kg), SpO2 98 %. Body mass index is 42.45 kg/m. Physical Exam  Constitutional: [name], place,  and time. She appears well-developed and well-nourished.  Cardiovascular: Normal rate.   Pulmonary/Chest: Effort normal.  Musculoskeletal: Normal range of motion.  Neurological: [name], place, and time.  Skin: Skin is warm and dry.  Psychiatric: She has a normal mood and affect. Her behavior is normal.  Vitals reviewed.   RECENT LABS AND TESTS: BMET    Component Value Date/Time   NA 144 08/31/2016 1000   NA 140 06/01/2016 1143   K 4.7 08/31/2016 1000   K 4.0 06/01/2016 1143   CL 103 08/31/2016 1000   CL 105 12/09/2012 1423   CO2 23 08/31/2016 1000   CO2 26 06/01/2016 1143   GLUCOSE 103 (H) 08/31/2016 1000   GLUCOSE 139 (H) 08/21/2016 2027   GLUCOSE 121 06/01/2016 1143   GLUCOSE 177 (H) 12/09/2012 1423   BUN 16 08/31/2016 1000   BUN 12.5 06/01/2016 1143   CREATININE 1.10 (H) 08/31/2016 1000   CREATININE 1.1 06/01/2016 1143   CALCIUM 10.3 08/31/2016 1000   CALCIUM 10.1 06/01/2016 1143   GFRNONAA 50 (L) 08/31/2016 1000   GFRAA 58 (L) 08/31/2016 1000   Lab Results  Component Value Date   HGBA1C 6.3 (H) 08/31/2016   HGBA1C (H) 11/11/2010    6.7 (NOTE)  According to the ADA Clinical Practice Recommendations for 2011, when HbA1c is used as a screening test:   >=6.5%   Diagnostic of Diabetes Mellitus           (if abnormal result  is confirmed)  5.7-6.4%   Increased risk of developing Diabetes Mellitus  References:Diagnosis and Classification of Diabetes Mellitus,Diabetes DXIP,3825,05(LZJQB 1):S62-S69 and Standards of Medical Care in         Diabetes - 2011,Diabetes HALP,3790,24  (Suppl 1):S11-S61.   Lab Results  Component Value Date   INSULIN 23.5 08/31/2016   CBC    Component Value Date/Time   WBC 8.2 08/31/2016 1000   WBC 14.5 (H) 08/21/2016 2027   RBC 4.27 08/31/2016 1000   RBC 4.46 08/21/2016 2027   HGB 11.8 (L) 08/21/2016 2027   HGB 11.1 (L) 06/01/2016 1143   HCT 34.4  08/31/2016 1000   HCT 34.9 06/01/2016 1143   PLT 267 08/21/2016 2027   PLT 248 06/01/2016 1143   MCV 81 08/31/2016 1000   MCV 81.2 06/01/2016 1143   MCH 25.8 (L) 08/31/2016 1000   MCH 26.5 08/21/2016 2027   MCHC 32.0 08/31/2016 1000   MCHC 33.1 08/21/2016 2027   RDW 15.6 (H) 08/31/2016 1000   RDW 15.8 (H) 06/01/2016 1143   LYMPHSABS 1.7 08/31/2016 1000   LYMPHSABS 1.8 06/01/2016 1143   MONOABS 0.6 06/01/2016 1143   EOSABS 0.1 08/31/2016 1000   BASOSABS 0.0 08/31/2016 1000   BASOSABS 0.0 06/01/2016 1143   Iron/TIBC/Ferritin/ %Sat    Component Value Date/Time   FERRITIN 362 (H) 04/15/2015 1058   Lipid Panel     Component Value Date/Time   CHOL 219 (H) 08/31/2016 1000   TRIG 178 (H) 08/31/2016 1000   HDL 44 08/31/2016 1000   CHOLHDL 6.4 11/11/2010 0555   VLDL 17 11/11/2010 0555   LDLCALC 139 (H) 08/31/2016 1000   Hepatic Function Panel     Component Value Date/Time   PROT 7.6 08/31/2016 1000   PROT 7.6 06/01/2016 1143   ALBUMIN 4.2 08/31/2016 1000   ALBUMIN 3.2 (L) 06/01/2016 1143   AST 14 08/31/2016 1000   AST 11 06/01/2016 1143   ALT 10 08/31/2016 1000   ALT 12 06/01/2016 1143   ALKPHOS 66 08/31/2016 1000   ALKPHOS 69 06/01/2016 1143   BILITOT 0.2 08/31/2016 1000   BILITOT 0.28 06/01/2016 1143     ASSESSMENT AND PLAN: Essential hypertension - Plan: hydrochlorothiazide (HYDRODIURIL) 12.5 MG tablet, DISCONTINUED: hydrochlorothiazide (HYDRODIURIL) 12.5 MG tablet  Adhesive capsulitis of left shoulder  Morbid obesity (HCC)  PLAN:  Hypertension We discussed sodium restriction, working on healthy weight loss, and a regular exercise program as the means to achieve improved blood pressure control. Karyl agreed with this plan and agreed to follow up as directed. We will continue to monitor her blood pressure as well as her progress with the above lifestyle modifications. Khalani agrees to continue to take HCTZ 12.5 mg 330 with no refills and She will continue her  medications as prescribed and will watch for signs of hypotension as she continues her lifestyle modifications. Atheena agrees to follow up with our clinic in 2 weeks.  Left Adhesive Capsulitis Tilda was encouraged to start physical therapy, but was given passive stretches to do in the meanwhile to help her shoulder not worsen in the meanwhile.  Obesity Yalonda is currently in the action stage of change. As such, her goal is to continue with weight loss efforts She has agreed to follow the  Category 2 plan Adelee has been instructed to work up to a goal of 150 minutes of combined cardio and strengthening exercise per week for weight loss and overall health benefits. We discussed the following Behavioral Modification Stratagies today: increasing lean protein intake, decrease eating out, work on meal planning and easy cooking plans and decrease junk food  Lilyauna has agreed to follow up with our clinic in 2 weeks. She was informed of the importance of frequent follow up visits to maximize her success with intensive lifestyle modifications for her multiple health conditions.  I, Doreene Nest, am acting as scribe for Dennard Nip, MD  I have reviewed the above documentation for accuracy and completeness, and I agree with the above. -Dennard Nip, MD

## 2016-11-30 ENCOUNTER — Ambulatory Visit (INDEPENDENT_AMBULATORY_CARE_PROVIDER_SITE_OTHER): Payer: Medicare Other | Admitting: Family Medicine

## 2016-11-30 VITALS — BP 135/78 | HR 65 | Temp 98.2°F | Ht 66.0 in | Wt 261.0 lb

## 2016-11-30 DIAGNOSIS — E559 Vitamin D deficiency, unspecified: Secondary | ICD-10-CM | POA: Diagnosis not present

## 2016-11-30 DIAGNOSIS — IMO0001 Reserved for inherently not codable concepts without codable children: Secondary | ICD-10-CM

## 2016-11-30 MED ORDER — VITAMIN D (ERGOCALCIFEROL) 1.25 MG (50000 UNIT) PO CAPS: 50000.0000 [IU] | ORAL_CAPSULE | ORAL | 0 refills | Status: DC

## 2016-11-30 NOTE — Progress Notes (Signed)
Office: [phone]  /  Fax: [phone]   HPI:   Chief Complaint: OBESITY [name] is here to discuss her progress with her obesity treatment plan. She is on the  follow the Category 2 plan and is following her eating plan approximately 85 % of the time. She states she is exercising 0 minutes 0 times per week. Shadiamond continues to do well with weight loss. She is not following the category 2 plan closely and is substituting a lot but is working on increasing protein and increasing vegetables. She is trying to decrease sugar but still using creamer in her coffee. Her weight is 261 lb (118.4 kg) today and has had a weight loss of 2 pounds over a period of 2 weeks since her last visit. She has lost 19 lbs since starting treatment with [name].  Vitamin D deficiency [name] has a diagnosis of vitamin D deficiency. She is currently stable on vit D and denies nausea, vomiting or muscle weakness.  Wt Readings from Last 500 Encounters:  11/30/16 261 lb (118.4 kg)  11/16/16 263 lb (119.3 kg)  10/31/16 265 lb (120.2 kg)  10/17/16 269 lb (122 kg)  10/03/16 273 lb (123.8 kg)  09/14/16 274 lb (124.3 kg)  08/31/16 280 lb (127 kg)  08/21/16 285 lb (129.3 kg)  07/20/16 287 lb 12.8 oz (130.5 kg)  06/01/16 286 lb 14.4 oz (130.1 kg)  01/20/16 288 lb (130.6 kg)  10/27/15 286 lb (129.7 kg)  09/01/15 286 lb 12.8 oz (130.1 kg)  06/29/15 286 lb 12.8 oz (130.1 kg)  04/15/15 285 lb 1.6 oz (129.3 kg)  04/13/15 284 lb 9.6 oz (129.1 kg)  12/29/14 289 lb 14.5 oz (131.5 kg)  12/23/14 290 lb 12.8 oz (131.9 kg)  10/28/14 292 lb (132.5 kg)  08/13/14 295 lb 9.6 oz (134.1 kg)  02/06/14 295 lb 6.4 oz (134 kg)  12/11/13 294 lb 8 oz (133.6 kg)  06/12/13 284 lb 11.2 oz (129.1 kg)  02/25/13 292 lb 9.6 oz (132.7 kg)  02/11/13 286 lb 9.6 oz (130 kg)  02/05/13 290 lb 9.6 oz (131.8 kg)  12/09/12 294 lb (133.4 kg)  11/18/12 296 lb (134.3 kg)  10/11/12 294 lb (133.4 kg)  10/10/12 295 lb 14.4 oz (134.2 kg)  09/23/12 294 lb  12.8 oz (133.7 kg)  08/27/12 295 lb 3.2 oz (133.9 kg)  07/17/12 292 lb 14.4 oz (132.9 kg)  06/14/12 292 lb (132.5 kg)  06/03/12 290 lb 9.6 oz (131.8 kg)  05/27/12 292 lb 8 oz (132.7 kg)  04/15/12 294 lb 11.2 oz (133.7 kg)  01/17/12 298 lb 1.6 oz (135.2 kg)  12/27/11 294 lb 1.6 oz (133.4 kg)  12/07/11 292 lb (132.5 kg)  11/24/11 297 lb 12.8 oz (135.1 kg)  11/14/11 298 lb 8 oz (135.4 kg)  11/07/11 298 lb 3.2 oz (135.3 kg)  10/31/11 298 lb 6.4 oz (135.4 kg)  10/24/11 294 lb (133.4 kg)  10/17/11 298 lb 3.2 oz (135.3 kg)  10/06/11 295 lb 6.4 oz (134 kg)  09/29/11 295 lb 4.8 oz (133.9 kg)  09/15/11 295 lb (133.8 kg)  09/11/11 294 lb 6.4 oz (133.5 kg)  09/11/11 294 lb 4.8 oz (133.5 kg)  09/05/11 296 lb 12.8 oz (134.6 kg)  08/10/11 299 lb 3.2 oz (135.7 kg)  04/04/11 298 lb 12.8 oz (135.5 kg)  01/30/11 (!) 303 lb 12.8 oz (137.8 kg)  10/15/07 (!) 317 lb (143.8 kg)  02/15/05 (!) 275 lb (124.7 kg)  09/23/07 (!) 304 lb (  137.9 kg)     ALLERGIES: Allergies  Allergen Reactions  . Cymbalta [Duloxetine Hcl] Other (See Comments)    Stomach pain  . Gabapentin   . Lipitor [Atorvastatin] Other (See Comments)    Cramps  . Maxzide [Hydrochlorothiazide W-Triamterene] Other (See Comments)    Cramping   . Pravastatin Sodium Other (See Comments)    Headache   . Simvastatin Other (See Comments)    Memory loss  . Lodine [Etodolac] Itching    MEDICATIONS: Current Outpatient Prescriptions on File Prior to Visit  Medication Sig Dispense Refill  . amitriptyline (ELAVIL) 25 MG tablet Take 25 mg by mouth at bedtime.    [name] aspirin 81 MG tablet Take 81 mg by mouth daily.    [name] escitalopram (LEXAPRO) 10 MG tablet Take 10 mg by mouth daily.    . hydrochlorothiazide (HYDRODIURIL) 12.5 MG tablet Take 1 tablet (12.5 mg total) by mouth daily. 30 tablet 0  . irbesartan (AVAPRO) 300 MG tablet Take 300 mg by mouth at bedtime.   0  . ketoconazole (NIZORAL) 2 % cream Apply 1 application topically daily. 15 g  0  . niacin 500 MG CR capsule Take 500 mg by mouth 2 (two) times daily with a meal.    . Omega-3 Fatty Acids (FISH OIL) 1200 MG CAPS Take 1,200 mg by mouth 2 (two) times daily.    . ondansetron (ZOFRAN) 4 MG tablet Take 1 tablet (4 mg total) by mouth every 6 (six) hours. 12 tablet 0  . tamoxifen (NOLVADEX) 20 MG tablet TAKE 1 TABLET BY MOUTH  DAILY 90 tablet 0  . verapamil (VERELAN PM) 240 MG 24 hr capsule Take 240 mg by mouth daily.     . vitamin B-12 (CYANOCOBALAMIN) 1000 MCG tablet Take 1,000 mcg by mouth daily.    . Vitamin D, Cholecalciferol, 400 UNITS TABS Take 400 Units by mouth at bedtime.     No current facility-administered medications on file prior to visit.     PAST MEDICAL HISTORY: Past Medical History:  Diagnosis Date  . Anemia   . Anxiety    takes Xanax daily  . Arthritis   . Back pain   . Blood transfusion   . Breast cancer ([place]) 2013   left breast  . Bronchitis    hx of  . Chronic fatigue syndrome   . Depression    takes Lexapro daily  . Diabetes mellitus without complication ([place])    boderline  . Diverticulosis   . Edema    feet and legs  . Headache(784.0)   . History of colon polyps 1998   adenomatous  . Hyperlipidemia    takes Niacin daily  . Hypertension    takes Verapamil and Avapro daily  . Joint pain   . OSA (obstructive sleep apnea)   . Pneumonia 4/12   Albuterol daily as needed  . Radiation 10/29/11-11/24/11   left breast 6100 cGy  . Shortness of breath    with exertion  . Spinal stenosis   . Tinnitus     PAST SURGICAL HISTORY: Past Surgical History:  Procedure Laterality Date  . APPENDECTOMY  1978  . BACK SURGERY    . BELPHAROPTOSIS REPAIR    . BREAST LUMPECTOMY  08/2011   left  . BREAST LUMPECTOMY WITH NEEDLE LOCALIZATION Right 02/06/2013   Procedure: RIGHT BREAST LUMPECTOMY WITH NEEDLE LOCALIZATION;  Surgeon: Harl Bowie, MD;  Location: [facility];  Service: General;  Laterality: Right;  . COLONOSCOPY W/ POLYPECTOMY    .  COLONOSCOPY WITH PROPOFOL N/A 10/27/2015   Procedure: COLONOSCOPY WITH PROPOFOL;  Surgeon: Mauri Pole, MD;  Location: [facility];  Service: Endoscopy;  Laterality: N/A;  . HARDWARE REMOVAL Left 04/20/2015   Procedure: HARDWARE REMOVAL LEFT LUMBAR FIVE SCREW;  Surgeon: Karie Chimera, MD;  Location: [facility];  Service: Neurosurgery;  Laterality: Left;  . JOINT REPLACEMENT Bilateral    bilateral knee  . POLYPECTOMY  1990's  . THYROID CYST EXCISION  1994  . TOTAL KNEE ARTHROPLASTY  2006   bilateral  . TRANSPHENOIDAL / TRANSNASAL HYPOPHYSECTOMY / RESECTION PITUITARY TUMOR  6/11    SOCIAL HISTORY: Social History  Substance Use Topics  . Smoking status: Never Smoker  . Smokeless tobacco: Never Used  . Alcohol use No    FAMILY HISTORY: Family History  Problem Relation Age of Onset  . Heart disease Father   . Clotting disorder Father   . Stroke Father   . Hypertension Father   . Heart Problems Father   . Stroke Mother   . Diabetes Mother   . Hypertension Mother   . Hyperlipidemia Mother   . Obesity Mother   . Cancer Sister 38    Breast Cancer  . Colon cancer Neg Hx   . Neuropathy Neg Hx     ROS: Review of Systems  Constitutional: Positive for weight loss.  Gastrointestinal: Negative for nausea and vomiting.  Musculoskeletal:       Negative muscle weakness    PHYSICAL EXAM: Blood pressure 135/78, pulse 65, temperature 98.2 F (36.8 C), temperature source Oral, height 5\' 6"  (1.676 m), weight 261 lb (118.4 kg), SpO2 100 %. Body mass index is 42.13 kg/m. Physical Exam  Constitutional: [name], place, and time. She appears well-developed and well-nourished.  Cardiovascular: Normal rate.   Pulmonary/Chest: Effort normal.  Musculoskeletal: Normal range of motion.  Neurological: [name], place, and time.  Skin: Skin is warm and dry.  Psychiatric: She has a normal mood and affect. Her behavior is normal.  Vitals reviewed.   RECENT  LABS AND TESTS: BMET    Component Value Date/Time   NA 144 08/31/2016 1000   NA 140 06/01/2016 1143   K 4.7 08/31/2016 1000   K 4.0 06/01/2016 1143   CL 103 08/31/2016 1000   CL 105 12/09/2012 1423   CO2 23 08/31/2016 1000   CO2 26 06/01/2016 1143   GLUCOSE 103 (H) 08/31/2016 1000   GLUCOSE 139 (H) 08/21/2016 2027   GLUCOSE 121 06/01/2016 1143   GLUCOSE 177 (H) 12/09/2012 1423   BUN 16 08/31/2016 1000   BUN 12.5 06/01/2016 1143   CREATININE 1.10 (H) 08/31/2016 1000   CREATININE 1.1 06/01/2016 1143   CALCIUM 10.3 08/31/2016 1000   CALCIUM 10.1 06/01/2016 1143   GFRNONAA 50 (L) 08/31/2016 1000   GFRAA 58 (L) 08/31/2016 1000   Lab Results  Component Value Date   HGBA1C 6.3 (H) 08/31/2016   HGBA1C (H) 11/11/2010    6.7 (NOTE)                                                                       According to the ADA Clinical Practice Recommendations for 2011, when HbA1c is used as a  screening test:   >=6.5%   Diagnostic of Diabetes Mellitus           (if abnormal result  is confirmed)  5.7-6.4%   Increased risk of developing Diabetes Mellitus  References:Diagnosis and Classification of Diabetes Mellitus,Diabetes OXBD,5329,92(EQAST 1):S62-S69 and Standards of Medical Care in         Diabetes - 2011,Diabetes Care,2011,34  (Suppl 1):S11-S61.   Lab Results  Component Value Date   INSULIN 23.5 08/31/2016   CBC    Component Value Date/Time   WBC 8.2 08/31/2016 1000   WBC 14.5 (H) 08/21/2016 2027   RBC 4.27 08/31/2016 1000   RBC 4.46 08/21/2016 2027   HGB 11.8 (L) 08/21/2016 2027   HGB 11.1 (L) 06/01/2016 1143   HCT 34.4 08/31/2016 1000   HCT 34.9 06/01/2016 1143   PLT 267 08/21/2016 2027   PLT 248 06/01/2016 1143   MCV 81 08/31/2016 1000   MCV 81.2 06/01/2016 1143   MCH 25.8 (L) 08/31/2016 1000   MCH 26.5 08/21/2016 2027   MCHC 32.0 08/31/2016 1000   MCHC 33.1 08/21/2016 2027   RDW 15.6 (H) 08/31/2016 1000   RDW 15.8 (H) 06/01/2016 1143   LYMPHSABS 1.7 08/31/2016 1000     LYMPHSABS 1.8 06/01/2016 1143   MONOABS 0.6 06/01/2016 1143   EOSABS 0.1 08/31/2016 1000   BASOSABS 0.0 08/31/2016 1000   BASOSABS 0.0 06/01/2016 1143   Iron/TIBC/Ferritin/ %Sat    Component Value Date/Time   FERRITIN 362 (H) 04/15/2015 1058   Lipid Panel     Component Value Date/Time   CHOL 219 (H) 08/31/2016 1000   TRIG 178 (H) 08/31/2016 1000   HDL 44 08/31/2016 1000   CHOLHDL 6.4 11/11/2010 0555   VLDL 17 11/11/2010 0555   LDLCALC 139 (H) 08/31/2016 1000   Hepatic Function Panel     Component Value Date/Time   PROT 7.6 08/31/2016 1000   PROT 7.6 06/01/2016 1143   ALBUMIN 4.2 08/31/2016 1000   ALBUMIN 3.2 (L) 06/01/2016 1143   AST 14 08/31/2016 1000   AST 11 06/01/2016 1143   ALT 10 08/31/2016 1000   ALT 12 06/01/2016 1143   ALKPHOS 66 08/31/2016 1000   ALKPHOS 69 06/01/2016 1143   BILITOT 0.2 08/31/2016 1000   BILITOT 0.28 06/01/2016 1143     ASSESSMENT AND PLAN: Vitamin D deficiency - Plan: Vitamin D, Ergocalciferol, (DRISDOL) 50000 units CAPS capsule  Morbid obesity (HCC) - BMI 42.1  PLAN:  Vitamin D Deficiency Tayllor was informed that low vitamin D levels contributes to fatigue and are associated with obesity, breast, and colon cancer. She agrees to continue to take prescription Vit D @50 ,000 IU every week, we will refill for 1 month and will re-check labs at next visit and will follow up for routine testing of vitamin D, at least 2-3 times per year. She was informed of the risk of over-replacement of vitamin D and agrees to not increase her dose unless he discusses this with [name] first. Makeisha agrees to follow up with our clinic in 2 weeks.  Obesity Everly is currently in the action stage of change. As such, her goal is to continue with weight loss efforts She has agreed to follow the Category 2 plan Padme has been instructed to work up to a goal of 150 minutes of combined cardio and strengthening exercise per week for weight loss and overall health  benefits. We discussed the following Behavioral Modification Stratagies today: decreasing simple carbohydrates , increasing vegetables, work on  meal planning and easy cooking plans and decrease liquid calories  Eryca has agreed to follow up with our clinic in 2 weeks. She was informed of the importance of frequent follow up visits to maximize her success with intensive lifestyle modifications for her multiple health conditions.  I, Doreene Nest, am acting as scribe for Dennard Nip, MD  I have reviewed the above documentation for accuracy and completeness, and I agree with the above. -Dennard Nip, MD

## 2016-12-14 ENCOUNTER — Ambulatory Visit (INDEPENDENT_AMBULATORY_CARE_PROVIDER_SITE_OTHER): Payer: Medicare Other | Admitting: Family Medicine

## 2016-12-14 VITALS — BP 127/79 | HR 90 | Temp 98.0°F | Ht 66.0 in | Wt 259.0 lb

## 2016-12-14 DIAGNOSIS — R748 Abnormal levels of other serum enzymes: Secondary | ICD-10-CM | POA: Diagnosis not present

## 2016-12-14 DIAGNOSIS — E7849 Other hyperlipidemia: Secondary | ICD-10-CM

## 2016-12-14 DIAGNOSIS — D508 Other iron deficiency anemias: Secondary | ICD-10-CM | POA: Diagnosis not present

## 2016-12-14 DIAGNOSIS — E559 Vitamin D deficiency, unspecified: Secondary | ICD-10-CM

## 2016-12-14 DIAGNOSIS — N189 Chronic kidney disease, unspecified: Secondary | ICD-10-CM

## 2016-12-14 DIAGNOSIS — IMO0001 Reserved for inherently not codable concepts without codable children: Secondary | ICD-10-CM

## 2016-12-14 DIAGNOSIS — E119 Type 2 diabetes mellitus without complications: Secondary | ICD-10-CM

## 2016-12-14 DIAGNOSIS — E784 Other hyperlipidemia: Secondary | ICD-10-CM | POA: Diagnosis not present

## 2016-12-14 MED ORDER — VITAMIN D (ERGOCALCIFEROL) 1.25 MG (50000 UNIT) PO CAPS: 50000.0000 [IU] | ORAL_CAPSULE | ORAL | 0 refills | Status: DC

## 2016-12-15 LAB — MICROALBUMIN / CREATININE URINE RATIO
Creatinine, Urine: 77.5 mg/dL
Microalb/Creat Ratio: 8.4 mg/g creat (ref 0.0–30.0)
Microalbumin, Urine: 6.5 ug/mL

## 2016-12-15 LAB — CBC WITH DIFFERENTIAL
BASOS: 0 %
Basophils Absolute: 0 10*3/uL (ref 0.0–0.2)
EOS (ABSOLUTE): 0.1 10*3/uL (ref 0.0–0.4)
EOS: 1 %
HEMOGLOBIN: 11 g/dL — AB (ref 11.1–15.9)
IMMATURE GRANULOCYTES: 0 %
Immature Grans (Abs): 0 10*3/uL (ref 0.0–0.1)
Lymphocytes Absolute: 1.5 10*3/uL (ref 0.7–3.1)
Lymphs: 21 %
MCH: 25.9 pg — ABNORMAL LOW (ref 26.6–33.0)
MCHC: 32.4 g/dL (ref 31.5–35.7)
MCV: 80 fL (ref 79–97)
MONOCYTES: 7 %
Monocytes Absolute: 0.5 10*3/uL (ref 0.1–0.9)
Neutrophils Absolute: 5.3 10*3/uL (ref 1.4–7.0)
Neutrophils: 71 %
RBC: 4.25 x10E6/uL (ref 3.77–5.28)
RDW: 16.4 % — ABNORMAL HIGH (ref 12.3–15.4)
WBC: 7.4 10*3/uL (ref 3.4–10.8)

## 2016-12-15 LAB — ANEMIA PANEL
FOLATE, HEMOLYSATE: 248 ng/mL
Ferritin: 479 ng/mL — ABNORMAL HIGH (ref 15–150)
Folate, RBC: 732 ng/mL (ref >498)
HEMATOCRIT: 33.9 % — AB (ref 34.0–46.6)
IRON: 55 ug/dL (ref 27–139)
Iron Saturation: 17 % (ref 15–55)
Retic Ct Pct: 1.1 % (ref 0.6–2.6)
Total Iron Binding Capacity: 329 ug/dL (ref 250–450)
UIBC: 274 ug/dL (ref 118–369)
VITAMIN B 12: 1972 pg/mL — AB (ref 232–1245)

## 2016-12-15 LAB — COMPREHENSIVE METABOLIC PANEL
ALBUMIN: 4.1 g/dL (ref 3.5–4.8)
ALT: 10 IU/L (ref 0–32)
AST: 10 IU/L (ref 0–40)
Albumin/Globulin Ratio: 1.3 (ref 1.2–2.2)
Alkaline Phosphatase: 68 IU/L (ref 39–117)
BUN / CREAT RATIO: 16 (ref 12–28)
BUN: 18 mg/dL (ref 8–27)
Bilirubin Total: 0.3 mg/dL (ref 0.0–1.2)
CALCIUM: 10.7 mg/dL — AB (ref 8.7–10.3)
CO2: 22 mmol/L (ref 18–29)
CREATININE: 1.15 mg/dL — AB (ref 0.57–1.00)
Chloride: 100 mmol/L (ref 96–106)
GFR calc Af Amer: 55 mL/min/{1.73_m2} — ABNORMAL LOW (ref >59)
GFR, EST NON AFRICAN AMERICAN: 47 mL/min/{1.73_m2} — AB (ref >59)
GLOBULIN, TOTAL: 3.2 g/dL (ref 1.5–4.5)
Glucose: 96 mg/dL (ref 65–99)
Potassium: 4.2 mmol/L (ref 3.5–5.2)
SODIUM: 141 mmol/L (ref 134–144)
TOTAL PROTEIN: 7.3 g/dL (ref 6.0–8.5)

## 2016-12-15 LAB — LIPID PANEL WITH LDL/HDL RATIO
Cholesterol, Total: 240 mg/dL — ABNORMAL HIGH (ref 100–199)
HDL: 43 mg/dL (ref >39)
LDL CALC: 160 mg/dL — AB (ref 0–99)
LDL/HDL RATIO: 3.7 ratio — AB (ref 0.0–3.2)
TRIGLYCERIDES: 184 mg/dL — AB (ref 0–149)
VLDL Cholesterol Cal: 37 mg/dL (ref 5–40)

## 2016-12-15 LAB — INSULIN, RANDOM: INSULIN: 20.9 u[IU]/mL (ref 2.6–24.9)

## 2016-12-15 LAB — VITAMIN D 25 HYDROXY (VIT D DEFICIENCY, FRACTURES): Vit D, 25-Hydroxy: 40.2 ng/mL (ref 30.0–100.0)

## 2016-12-15 LAB — HEMOGLOBIN A1C
Est. average glucose Bld gHb Est-mCnc: 131 mg/dL
Hgb A1c MFr Bld: 6.2 % — ABNORMAL HIGH (ref 4.8–5.6)

## 2016-12-18 DIAGNOSIS — M25571 Pain in right ankle and joints of right foot: Secondary | ICD-10-CM | POA: Diagnosis not present

## 2016-12-18 NOTE — Progress Notes (Signed)
Office: [phone]  /  Fax: [phone]   HPI:   Chief Complaint: OBESITY [name] is here to discuss her progress with her obesity treatment plan. She is on the  follow the Category 2 plan and is following her eating plan approximately 95 % of the time. She states she is exercising 0 minutes 0 times per week. [name] continues to do well with weight loss. She is deviating from her plan more and protein has decreased. Her weight is 259 lb (117.5 kg) today and has had a weight loss of 2 pounds over a period of 2 weeks since her last visit. She has lost 21 lbs since starting treatment with [name].  Vitamin D deficiency [name] has a diagnosis of vitamin D deficiency. She is currently stable on vit D, fatigue is improving and she denies nausea, vomiting or muscle weakness.  Diabetes II [name] has a diagnosis of diabetes type II. She is doing well on diet and denies any hypoglycemic episodes. She has been working on intensive lifestyle modifications including diet, exercise, and weight loss to help control her blood glucose levels.  Hyperlipidemia [name] has hyperlipidemia and is not on statin. She has been attempting to control her cholesterol levels with intensive lifestyle modification including a low saturated fat diet, exercise and weight loss. She denies any chest pain, claudication or myalgias. She is due for labs.  Increased B12 level [name] is on B12 supplement and is on B12 rich diet, fatigue is improving.  Iron Deficiency Anemia [name] has a diagnosis of iron deficiency anemia.  She is not on iron supplementation currently. This may also be due in part to chronic renal failure.  Chronic Renal Failure [name] has history of chronic renal failure and is working to improve with diet and weight loss. She is due for labs.  ALLERGIES: Allergies  Allergen Reactions  . Cymbalta [Duloxetine Hcl] Other (See Comments)    Stomach pain  . Gabapentin   . Lipitor [Atorvastatin] Other (See Comments)      Cramps  . Maxzide [Hydrochlorothiazide W-Triamterene] Other (See Comments)    Cramping   . Pravastatin Sodium Other (See Comments)    Headache   . Simvastatin Other (See Comments)    Memory loss  . Lodine [Etodolac] Itching    MEDICATIONS: Current Outpatient Prescriptions on File Prior to Visit  Medication Sig Dispense Refill  . amitriptyline (ELAVIL) 25 MG tablet Take 25 mg by mouth at bedtime.    [name] aspirin 81 MG tablet Take 81 mg by mouth daily.    [name] escitalopram (LEXAPRO) 10 MG tablet Take 10 mg by mouth daily.    . hydrochlorothiazide (HYDRODIURIL) 12.5 MG tablet Take 1 tablet (12.5 mg total) by mouth daily. 30 tablet 0  . irbesartan (AVAPRO) 300 MG tablet Take 300 mg by mouth at bedtime.   0  . ketoconazole (NIZORAL) 2 % cream Apply 1 application topically daily. 15 g 0  . niacin 500 MG CR capsule Take 500 mg by mouth 2 (two) times daily with a meal.    . Omega-3 Fatty Acids (FISH OIL) 1200 MG CAPS Take 1,200 mg by mouth 2 (two) times daily.    . ondansetron (ZOFRAN) 4 MG tablet Take 1 tablet (4 mg total) by mouth every 6 (six) hours. 12 tablet 0  . tamoxifen (NOLVADEX) 20 MG tablet TAKE 1 TABLET BY MOUTH  DAILY 90 tablet 0  . verapamil (VERELAN PM) 240 MG 24 hr capsule Take 240 mg by mouth daily.     [name]  vitamin B-12 (CYANOCOBALAMIN) 1000 MCG tablet Take 1,000 mcg by mouth daily.    . Vitamin D, Cholecalciferol, 400 UNITS TABS Take 400 Units by mouth at bedtime.     No current facility-administered medications on file prior to visit.     PAST MEDICAL HISTORY: Past Medical History:  Diagnosis Date  . Anemia   . Anxiety    takes Xanax daily  . Arthritis   . Back pain   . Blood transfusion   . Breast cancer ([place]) 2013   left breast  . Bronchitis    hx of  . Chronic fatigue syndrome   . Depression    takes Lexapro daily  . Diabetes mellitus without complication ([place])    boderline  . Diverticulosis   . Edema    feet and legs  . Headache(784.0)   . History of  colon polyps 1998   adenomatous  . Hyperlipidemia    takes Niacin daily  . Hypertension    takes Verapamil and Avapro daily  . Joint pain   . OSA (obstructive sleep apnea)   . Pneumonia 4/12   Albuterol daily as needed  . Radiation 10/29/11-11/24/11   left breast 6100 cGy  . Shortness of breath    with exertion  . Spinal stenosis   . Tinnitus     PAST SURGICAL HISTORY: Past Surgical History:  Procedure Laterality Date  . APPENDECTOMY  1978  . BACK SURGERY    . BELPHAROPTOSIS REPAIR    . BREAST LUMPECTOMY  08/2011   left  . BREAST LUMPECTOMY WITH NEEDLE LOCALIZATION Right 02/06/2013   Procedure: RIGHT BREAST LUMPECTOMY WITH NEEDLE LOCALIZATION;  Surgeon: Harl Bowie, MD;  Location: [facility];  Service: General;  Laterality: Right;  . COLONOSCOPY W/ POLYPECTOMY    . COLONOSCOPY WITH PROPOFOL N/A 10/27/2015   Procedure: COLONOSCOPY WITH PROPOFOL;  Surgeon: Mauri Pole, MD;  Location: [facility];  Service: Endoscopy;  Laterality: N/A;  . HARDWARE REMOVAL Left 04/20/2015   Procedure: HARDWARE REMOVAL LEFT LUMBAR FIVE SCREW;  Surgeon: Karie Chimera, MD;  Location: [facility];  Service: Neurosurgery;  Laterality: Left;  . JOINT REPLACEMENT Bilateral    bilateral knee  . POLYPECTOMY  1990's  . THYROID CYST EXCISION  1994  . TOTAL KNEE ARTHROPLASTY  2006   bilateral  . TRANSPHENOIDAL / TRANSNASAL HYPOPHYSECTOMY / RESECTION PITUITARY TUMOR  6/11    SOCIAL HISTORY: Social History  Substance Use Topics  . Smoking status: Never Smoker  . Smokeless tobacco: Never Used  . Alcohol use No    FAMILY HISTORY: Family History  Problem Relation Age of Onset  . Heart disease Father   . Clotting disorder Father   . Stroke Father   . Hypertension Father   . Heart Problems Father   . Stroke Mother   . Diabetes Mother   . Hypertension Mother   . Hyperlipidemia Mother   . Obesity Mother   . Cancer Sister 73       Breast Cancer  . Colon cancer Neg Hx   . Neuropathy Neg Hx      ROS: Review of Systems  Constitutional: Positive for malaise/fatigue and weight loss.  Cardiovascular: Negative for chest pain and claudication.  Gastrointestinal: Negative for nausea and vomiting.  Musculoskeletal: Negative for myalgias.       Negative muscle weakness  Endo/Heme/Allergies:       Negative hypoglycemia    PHYSICAL EXAM: Blood pressure 127/79, pulse 90, temperature 98 F (36.7 C), temperature source  Oral, height 5\' 6"  (1.676 m), weight 259 lb (117.5 kg), SpO2 100 %. Body mass index is 41.8 kg/m. Physical Exam  Constitutional: [name], place, and time. She appears well-developed and well-nourished.  Cardiovascular: Normal rate.   Pulmonary/Chest: Effort normal.  Musculoskeletal: Normal range of motion.  Neurological: [name], place, and time.  Skin: Skin is warm and dry.  Psychiatric: She has a normal mood and affect. Her behavior is normal.  Vitals reviewed.   RECENT LABS AND TESTS: BMET    Component Value Date/Time   NA 141 12/14/2016 1146   NA 140 06/01/2016 1143   K 4.2 12/14/2016 1146   K 4.0 06/01/2016 1143   CL 100 12/14/2016 1146   CL 105 12/09/2012 1423   CO2 22 12/14/2016 1146   CO2 26 06/01/2016 1143   GLUCOSE 96 12/14/2016 1146   GLUCOSE 139 (H) 08/21/2016 2027   GLUCOSE 121 06/01/2016 1143   GLUCOSE 177 (H) 12/09/2012 1423   BUN 18 12/14/2016 1146   BUN 12.5 06/01/2016 1143   CREATININE 1.15 (H) 12/14/2016 1146   CREATININE 1.1 06/01/2016 1143   CALCIUM 10.7 (H) 12/14/2016 1146   CALCIUM 10.1 06/01/2016 1143   GFRNONAA 47 (L) 12/14/2016 1146   GFRAA 55 (L) 12/14/2016 1146   Lab Results  Component Value Date   HGBA1C 6.2 (H) 12/14/2016   HGBA1C 6.3 (H) 08/31/2016   HGBA1C (H) 11/11/2010    6.7 (NOTE)                                                                       According to the ADA Clinical Practice Recommendations for 2011, when HbA1c is used as a screening test:   >=6.5%    Diagnostic of Diabetes Mellitus           (if abnormal result  is confirmed)  5.7-6.4%   Increased risk of developing Diabetes Mellitus  References:Diagnosis and Classification of Diabetes Mellitus,Diabetes JHER,7408,14(GYJEH 1):S62-S69 and Standards of Medical Care in         Diabetes - 2011,Diabetes Care,2011,34  (Suppl 1):S11-S61.   Lab Results  Component Value Date   INSULIN 20.9 12/14/2016   INSULIN 23.5 08/31/2016   CBC    Component Value Date/Time   WBC 7.4 12/14/2016 1146   WBC 14.5 (H) 08/21/2016 2027   RBC 4.25 12/14/2016 1146   RBC 4.46 08/21/2016 2027   HGB 11.8 (L) 08/21/2016 2027   HGB 11.1 (L) 06/01/2016 1143   HCT 33.9 (L) 12/14/2016 1146   HCT 34.9 06/01/2016 1143   PLT 267 08/21/2016 2027   PLT 248 06/01/2016 1143   MCV 80 12/14/2016 1146   MCV 81.2 06/01/2016 1143   MCH 25.9 (L) 12/14/2016 1146   MCH 26.5 08/21/2016 2027   MCHC 32.4 12/14/2016 1146   MCHC 33.1 08/21/2016 2027   RDW 16.4 (H) 12/14/2016 1146   RDW 15.8 (H) 06/01/2016 1143   LYMPHSABS 1.5 12/14/2016 1146   LYMPHSABS 1.8 06/01/2016 1143   MONOABS 0.6 06/01/2016 1143   EOSABS 0.1 12/14/2016 1146   BASOSABS 0.0 12/14/2016 1146   BASOSABS 0.0 06/01/2016 1143   Iron/TIBC/Ferritin/ %Sat    Component Value Date/Time   IRON 55 12/14/2016  1146   TIBC 329 12/14/2016 1146   FERRITIN 479 (H) 12/14/2016 1146   FERRITIN 362 (H) 04/15/2015 1058   IRONPCTSAT 17 12/14/2016 1146   Lipid Panel     Component Value Date/Time   CHOL 240 (H) 12/14/2016 1146   TRIG 184 (H) 12/14/2016 1146   HDL 43 12/14/2016 1146   CHOLHDL 6.4 11/11/2010 0555   VLDL 17 11/11/2010 0555   LDLCALC 160 (H) 12/14/2016 1146   Hepatic Function Panel     Component Value Date/Time   PROT 7.3 12/14/2016 1146   PROT 7.6 06/01/2016 1143   ALBUMIN 4.1 12/14/2016 1146   ALBUMIN 3.2 (L) 06/01/2016 1143   AST 10 12/14/2016 1146   AST 11 06/01/2016 1143   ALT 10 12/14/2016 1146   ALT 12 06/01/2016 1143   ALKPHOS 68  12/14/2016 1146   ALKPHOS 69 06/01/2016 1143   BILITOT 0.3 12/14/2016 1146   BILITOT 0.28 06/01/2016 1143     ASSESSMENT AND PLAN: Vitamin D deficiency - Plan: VITAMIN D 25 Hydroxy (Vit-D Deficiency, Fractures), Vitamin D, Ergocalciferol, (DRISDOL) 50000 units CAPS capsule  Type 2 diabetes mellitus without complication, without long-term current use of insulin (HCC) - Plan: Insulin, random, Hemoglobin A1c, Microalbumin / creatinine urine ratio  Other hyperlipidemia - Plan: Lipid Panel With LDL/HDL Ratio  Increased vitamin B12 level - Plan: Vitamin B12  Other iron deficiency anemia - Plan: CBC With Differential, Anemia panel  Chronic renal failure, unspecified CKD stage - Plan: Comprehensive metabolic panel  Morbid obesity (HCC)  PLAN:  Vitamin D Deficiency [name] was informed that low vitamin D levels contributes to fatigue and are associated with obesity, breast, and colon cancer. She agrees to continue to take prescription Vit D @50 ,000 IU every week, we will refill for 1 month and we will check labs and will follow up for routine testing of vitamin D, at least 2-3 times per year. She was informed of the risk of over-replacement of vitamin D and agrees to not increase her dose unless he discusses this with [name] first. [name] agrees to follow up with our clinic in 2 weeks.  Diabetes II [name] has been given extensive diabetes education by myself today including ideal fasting and post-prandial blood glucose readings, individual ideal Hgb A1c goals  and hypoglycemia prevention. We discussed the importance of good blood sugar control to decrease the likelihood of diabetic complications such as nephropathy, neuropathy, limb loss, blindness, coronary artery disease, and death. We discussed the importance of intensive lifestyle modification including diet, exercise and weight loss as the first line treatment for diabetes. We will check labs and [name] agrees to continue her diabetes medications and  will follow up at the agreed upon time.  Hyperlipidemia [name] was informed of the American Heart Association Guidelines emphasizing intensive lifestyle modifications as the first line treatment for hyperlipidemia. We discussed many lifestyle modifications today in depth, and [name] will continue to work on decreasing saturated fats such as fatty red meat, butter and many fried foods. She will also increase vegetables and lean protein in her diet and continue to work on exercise and weight loss efforts. We will check labs and Twanda agrees to follow up at agreed upon time.  Increased B12 level We will check labs and [name] agrees to continue to take vitamin B12 and follow up with our clinic in 2 weeks.  Iron Deficiency Anemia The diagnosis of Iron deficiency anemia was discussed with [name] and was explained in detail. She was given suggestions of  iron rich foods and and iron supplement was not prescribed. We will check labs and follow.  Chronic Renal Failure We will check labs and [name] agrees to continue with diet and weight loss efforts and will follow up at agreed upon time.  Obesity [name] is currently in the action stage of change. As such, her goal is to continue with weight loss efforts She has agreed to follow the Category 2 plan [name] has been instructed to work up to a goal of 150 minutes of combined cardio and strengthening exercise per week for weight loss and overall health benefits. We discussed the following Behavioral Modification Strategies today: increasing lean protein intake, decreasing simple carbohydrates  and decrease eating out  [name] has agreed to follow up with our clinic in 2 weeks. She was informed of the importance of frequent follow up visits to maximize her success with intensive lifestyle modifications for her multiple health conditions.  I, Doreene Nest, am acting as scribe for Dennard Nip, MD  I have reviewed the above documentation for accuracy and  completeness, and I agree with the above. -Dennard Nip, MD

## 2016-12-28 ENCOUNTER — Ambulatory Visit (INDEPENDENT_AMBULATORY_CARE_PROVIDER_SITE_OTHER): Payer: Medicare Other | Admitting: Family Medicine

## 2016-12-28 VITALS — BP 157/78 | HR 75 | Temp 98.6°F | Ht 66.0 in | Wt 259.0 lb

## 2016-12-28 DIAGNOSIS — IMO0001 Reserved for inherently not codable concepts without codable children: Secondary | ICD-10-CM

## 2016-12-28 DIAGNOSIS — Z6841 Body Mass Index (BMI) 40.0 and over, adult: Secondary | ICD-10-CM | POA: Diagnosis not present

## 2016-12-28 DIAGNOSIS — E559 Vitamin D deficiency, unspecified: Secondary | ICD-10-CM

## 2016-12-28 DIAGNOSIS — I10 Essential (primary) hypertension: Secondary | ICD-10-CM | POA: Diagnosis not present

## 2016-12-28 MED ORDER — VITAMIN D (ERGOCALCIFEROL) 1.25 MG (50000 UNIT) PO CAPS: 50000.0000 [IU] | ORAL_CAPSULE | ORAL | 0 refills | Status: DC

## 2016-12-28 MED ORDER — HYDROCHLOROTHIAZIDE 12.5 MG PO TABS: 12.5000 mg | ORAL_TABLET | Freq: Every day | ORAL | 0 refills | Status: DC

## 2016-12-28 NOTE — Progress Notes (Signed)
Office: [phone]  /  Fax: [phone]   HPI:   Chief Complaint: OBESITY [name] is here to discuss her progress with her obesity treatment plan. She is on the  follow the Category 2 plan and is following her eating plan approximately 95 % of the time. She states she is exercising 0 minutes 0 times per week. [name]  Maintained weight. She has been deviating over the holiday. She is ready to get back on track. Her weight is 259 lb (117.5 kg) today and has maintained weight over a period of 2 weeks since her last visit. She has lost 21 lbs since starting treatment with [name].  Vitamin D deficiency [name] has a diagnosis of vitamin D deficiency. She is currently taking vit D, her numbers are improving, not yet at goal and she denies nausea, vomiting or muscle weakness.  Hypertension [name] is a 71 y.o. female with hypertension.  [name] denies chest pain, dizziness or headache. She is working weight loss to help control her blood pressure with the goal of decreasing her risk of heart attack and stroke. [name] blood pressure is not currently controlled.    ALLERGIES: Allergies  Allergen Reactions  . Cymbalta [Duloxetine Hcl] Other (See Comments)    Stomach pain  . Gabapentin   . Lipitor [Atorvastatin] Other (See Comments)    Cramps  . Maxzide [Hydrochlorothiazide W-Triamterene] Other (See Comments)    Cramping   . Pravastatin Sodium Other (See Comments)    Headache   . Simvastatin Other (See Comments)    Memory loss  . Lodine [Etodolac] Itching    MEDICATIONS: Current Outpatient Prescriptions on File Prior to Visit  Medication Sig Dispense Refill  . amitriptyline (ELAVIL) 25 MG tablet Take 25 mg by mouth at bedtime.    [name] aspirin 81 MG tablet Take 81 mg by mouth daily.    [name] escitalopram (LEXAPRO) 10 MG tablet Take 10 mg by mouth daily.    . irbesartan (AVAPRO) 300 MG tablet Take 300 mg by mouth at bedtime.   0  . ketoconazole (NIZORAL) 2 % cream Apply 1  application topically daily. 15 g 0  . niacin 500 MG CR capsule Take 500 mg by mouth 2 (two) times daily with a meal.    . Omega-3 Fatty Acids (FISH OIL) 1200 MG CAPS Take 1,200 mg by mouth 2 (two) times daily.    . ondansetron (ZOFRAN) 4 MG tablet Take 1 tablet (4 mg total) by mouth every 6 (six) hours. 12 tablet 0  . tamoxifen (NOLVADEX) 20 MG tablet TAKE 1 TABLET BY MOUTH  DAILY 90 tablet 0  . verapamil (VERELAN PM) 240 MG 24 hr capsule Take 240 mg by mouth daily.     . vitamin B-12 (CYANOCOBALAMIN) 1000 MCG tablet Take 1,000 mcg by mouth daily.    . Vitamin D, Cholecalciferol, 400 UNITS TABS Take 400 Units by mouth at bedtime.     No current facility-administered medications on file prior to visit.     PAST MEDICAL HISTORY: Past Medical History:  Diagnosis Date  . Anemia   . Anxiety    takes Xanax daily  . Arthritis   . Back pain   . Blood transfusion   . Breast cancer ([place]) 2013   left breast  . Bronchitis    hx of  . Chronic fatigue syndrome   . Depression    takes Lexapro daily  . Diabetes mellitus without complication ([place])    boderline  . Diverticulosis   .  Edema    feet and legs  . Headache(784.0)   . History of colon polyps 1998   adenomatous  . Hyperlipidemia    takes Niacin daily  . Hypertension    takes Verapamil and Avapro daily  . Joint pain   . OSA (obstructive sleep apnea)   . Pneumonia 4/12   Albuterol daily as needed  . Radiation 10/29/11-11/24/11   left breast 6100 cGy  . Shortness of breath    with exertion  . Spinal stenosis   . Tinnitus     PAST SURGICAL HISTORY: Past Surgical History:  Procedure Laterality Date  . APPENDECTOMY  1978  . BACK SURGERY    . BELPHAROPTOSIS REPAIR    . BREAST LUMPECTOMY  08/2011   left  . BREAST LUMPECTOMY WITH NEEDLE LOCALIZATION Right 02/06/2013   Procedure: RIGHT BREAST LUMPECTOMY WITH NEEDLE LOCALIZATION;  Surgeon: Harl Bowie, MD;  Location: [facility];  Service: General;  Laterality: Right;  .  COLONOSCOPY W/ POLYPECTOMY    . COLONOSCOPY WITH PROPOFOL N/A 10/27/2015   Procedure: COLONOSCOPY WITH PROPOFOL;  Surgeon: Mauri Pole, MD;  Location: [facility];  Service: Endoscopy;  Laterality: N/A;  . HARDWARE REMOVAL Left 04/20/2015   Procedure: HARDWARE REMOVAL LEFT LUMBAR FIVE SCREW;  Surgeon: Karie Chimera, MD;  Location: [facility];  Service: Neurosurgery;  Laterality: Left;  . JOINT REPLACEMENT Bilateral    bilateral knee  . POLYPECTOMY  1990's  . THYROID CYST EXCISION  1994  . TOTAL KNEE ARTHROPLASTY  2006   bilateral  . TRANSPHENOIDAL / TRANSNASAL HYPOPHYSECTOMY / RESECTION PITUITARY TUMOR  6/11    SOCIAL HISTORY: Social History  Substance Use Topics  . Smoking status: Never Smoker  . Smokeless tobacco: Never Used  . Alcohol use No    FAMILY HISTORY: Family History  Problem Relation Age of Onset  . Heart disease Father   . Clotting disorder Father   . Stroke Father   . Hypertension Father   . Heart Problems Father   . Stroke Mother   . Diabetes Mother   . Hypertension Mother   . Hyperlipidemia Mother   . Obesity Mother   . Cancer Sister 48       Breast Cancer  . Colon cancer Neg Hx   . Neuropathy Neg Hx     ROS: Review of Systems  Constitutional: Negative for weight loss.  Cardiovascular: Negative for chest pain.  Gastrointestinal: Negative for nausea and vomiting.  Musculoskeletal:       Negative muscle weakness  Neurological: Negative for dizziness and headaches.    PHYSICAL EXAM: Blood pressure (!) 157/78, pulse 75, temperature 98.6 F (37 C), temperature source Oral, height 5\' 6"  (1.676 m), weight 259 lb (117.5 kg), SpO2 98 %. Body mass index is 41.8 kg/m. Physical Exam  Constitutional: [name], place, and time. She appears well-developed and well-nourished.  Cardiovascular: Normal rate.   Pulmonary/Chest: Effort normal.  Musculoskeletal: Normal range of motion.  Neurological: [name], place, and time.    Skin: Skin is warm and dry.  Psychiatric: She has a normal mood and affect. Her behavior is normal.  Vitals reviewed.   RECENT LABS AND TESTS: BMET    Component Value Date/Time   NA 141 12/14/2016 1146   NA 140 06/01/2016 1143   K 4.2 12/14/2016 1146   K 4.0 06/01/2016 1143   CL 100 12/14/2016 1146   CL 105 12/09/2012 1423   CO2 22 12/14/2016 1146  CO2 26 06/01/2016 1143   GLUCOSE 96 12/14/2016 1146   GLUCOSE 139 (H) 08/21/2016 2027   GLUCOSE 121 06/01/2016 1143   GLUCOSE 177 (H) 12/09/2012 1423   BUN 18 12/14/2016 1146   BUN 12.5 06/01/2016 1143   CREATININE 1.15 (H) 12/14/2016 1146   CREATININE 1.1 06/01/2016 1143   CALCIUM 10.7 (H) 12/14/2016 1146   CALCIUM 10.1 06/01/2016 1143   GFRNONAA 47 (L) 12/14/2016 1146   GFRAA 55 (L) 12/14/2016 1146   Lab Results  Component Value Date   HGBA1C 6.2 (H) 12/14/2016   HGBA1C 6.3 (H) 08/31/2016   HGBA1C (H) 11/11/2010    6.7 (NOTE)                                                                       According to the ADA Clinical Practice Recommendations for 2011, when HbA1c is used as a screening test:   >=6.5%   Diagnostic of Diabetes Mellitus           (if abnormal result  is confirmed)  5.7-6.4%   Increased risk of developing Diabetes Mellitus  References:Diagnosis and Classification of Diabetes Mellitus,Diabetes XTGG,2694,85(IOEVO 1):S62-S69 and Standards of Medical Care in         Diabetes - 2011,Diabetes Care,2011,34  (Suppl 1):S11-S61.   Lab Results  Component Value Date   INSULIN 20.9 12/14/2016   INSULIN 23.5 08/31/2016   CBC    Component Value Date/Time   WBC 7.4 12/14/2016 1146   WBC 14.5 (H) 08/21/2016 2027   RBC 4.25 12/14/2016 1146   RBC 4.46 08/21/2016 2027   HGB 11.8 (L) 08/21/2016 2027   HGB 11.1 (L) 06/01/2016 1143   HCT 33.9 (L) 12/14/2016 1146   HCT 34.9 06/01/2016 1143   PLT 267 08/21/2016 2027   PLT 248 06/01/2016 1143   MCV 80 12/14/2016 1146   MCV 81.2 06/01/2016 1143   MCH 25.9 (L)  12/14/2016 1146   MCH 26.5 08/21/2016 2027   MCHC 32.4 12/14/2016 1146   MCHC 33.1 08/21/2016 2027   RDW 16.4 (H) 12/14/2016 1146   RDW 15.8 (H) 06/01/2016 1143   LYMPHSABS 1.5 12/14/2016 1146   LYMPHSABS 1.8 06/01/2016 1143   MONOABS 0.6 06/01/2016 1143   EOSABS 0.1 12/14/2016 1146   BASOSABS 0.0 12/14/2016 1146   BASOSABS 0.0 06/01/2016 1143   Iron/TIBC/Ferritin/ %Sat    Component Value Date/Time   IRON 55 12/14/2016 1146   TIBC 329 12/14/2016 1146   FERRITIN 479 (H) 12/14/2016 1146   FERRITIN 362 (H) 04/15/2015 1058   IRONPCTSAT 17 12/14/2016 1146   Lipid Panel     Component Value Date/Time   CHOL 240 (H) 12/14/2016 1146   TRIG 184 (H) 12/14/2016 1146   HDL 43 12/14/2016 1146   CHOLHDL 6.4 11/11/2010 0555   VLDL 17 11/11/2010 0555   LDLCALC 160 (H) 12/14/2016 1146   Hepatic Function Panel     Component Value Date/Time   PROT 7.3 12/14/2016 1146   PROT 7.6 06/01/2016 1143   ALBUMIN 4.1 12/14/2016 1146   ALBUMIN 3.2 (L) 06/01/2016 1143   AST 10 12/14/2016 1146   AST 11 06/01/2016 1143   ALT 10 12/14/2016 1146   ALT 12 06/01/2016 1143   ALKPHOS 68  12/14/2016 1146   ALKPHOS 69 06/01/2016 1143   BILITOT 0.3 12/14/2016 1146   BILITOT 0.28 06/01/2016 1143     ASSESSMENT AND PLAN: Essential hypertension - Plan: hydrochlorothiazide (HYDRODIURIL) 12.5 MG tablet  Vitamin D deficiency - Plan: Vitamin D, Ergocalciferol, (DRISDOL) 50000 units CAPS capsule  Morbid obesity (HCC)  PLAN:  Vitamin D Deficiency [name] was informed that low vitamin D levels contributes to fatigue and are associated with obesity, breast, and colon cancer. She agrees to continue to take prescription Vit D @50 ,000 IU every week,we will refill for 1 month and will follow up for routine testing of vitamin D, at least 2-3 times per year. She was informed of the risk of over-replacement of vitamin D and agrees to not increase her dose unless he discusses this with [name] first. [name] agrees to follow up  with our clinic in 2 weeks.  Hypertension We discussed sodium restriction, working on healthy weight loss, and a regular exercise program as the means to achieve improved blood pressure control. [name] agreed with this plan and agreed to follow up as directed. We will continue to monitor her blood pressure as well as her progress with the above lifestyle modifications. She will continue her medications as prescribed, we will refill HCTZ 12.5 mg qd for 1 month and she will watch for signs of hypotension as she continues her lifestyle modifications. [name] agrees to follow up with our clinic in 2 weeks.  Obesity  [name] is currently in the action stage of change. As such, her goal is to continue with weight loss efforts She has agreed to follow the Category 2 plan [name] has been instructed to work up to a goal of 150 minutes of combined cardio and strengthening exercise per week for weight loss and overall health benefits. We discussed the following Behavioral Modification Strategies today: increasing H2O, increasing lean protein intake and holiday eating strategies   Fabian has agreed to follow up with our clinic in 2 weeks. She was informed of the importance of frequent follow up visits to maximize her success with intensive lifestyle modifications for her multiple health conditions.  I, Doreene Nest, am acting as scribe for Dennard Nip, MD  I have reviewed the above documentation for accuracy and completeness, and I agree with the above. -Dennard Nip, MD  OBESITY BEHAVIORAL INTERVENTION VISIT  Today's visit was # 9 out of 22.  Starting weight: 280 lbs Starting date: 08/31/16 Today's weight : 259 lbs Today's date: 12/28/2016 Total lbs lost to date: 21 (Patients must lose 7 lbs in the first 6 months to continue with counseling)   ASK: We discussed the diagnosis of obesity with [name] today and [name] agreed to give [name] permission to discuss obesity behavioral modification therapy  today.  ASSESS: [name] has the diagnosis of obesity and her BMI today is 41.9 [name] is in the action stage of change   ADVISE: [name] was educated on the multiple health risks of obesity as well as the benefit of weight loss to improve her health. She was advised of the need for long term treatment and the importance of lifestyle modifications.  AGREE: Multiple dietary modification options and treatment options were discussed and  [name] agreed to follow the Category 2 plan We discussed the following Behavioral Modification Strategies today: increasing H2O, increasing lean protein intake and holiday eating strategies

## 2017-01-01 DIAGNOSIS — S93401A Sprain of unspecified ligament of right ankle, initial encounter: Secondary | ICD-10-CM | POA: Diagnosis not present

## 2017-01-01 DIAGNOSIS — M25571 Pain in right ankle and joints of right foot: Secondary | ICD-10-CM | POA: Diagnosis not present

## 2017-01-16 ENCOUNTER — Ambulatory Visit (INDEPENDENT_AMBULATORY_CARE_PROVIDER_SITE_OTHER): Payer: Medicare Other | Admitting: Family Medicine

## 2017-01-16 VITALS — BP 137/72 | HR 77 | Temp 98.0°F | Ht 66.0 in | Wt 258.0 lb

## 2017-01-16 DIAGNOSIS — E559 Vitamin D deficiency, unspecified: Secondary | ICD-10-CM

## 2017-01-16 DIAGNOSIS — Z6841 Body Mass Index (BMI) 40.0 and over, adult: Secondary | ICD-10-CM | POA: Diagnosis not present

## 2017-01-16 DIAGNOSIS — IMO0001 Reserved for inherently not codable concepts without codable children: Secondary | ICD-10-CM

## 2017-01-16 DIAGNOSIS — I10 Essential (primary) hypertension: Secondary | ICD-10-CM | POA: Diagnosis not present

## 2017-01-16 MED ORDER — HYDROCHLOROTHIAZIDE 12.5 MG PO TABS: 12.5000 mg | ORAL_TABLET | Freq: Every day | ORAL | 0 refills | Status: DC

## 2017-01-16 MED ORDER — VITAMIN D (ERGOCALCIFEROL) 1.25 MG (50000 UNIT) PO CAPS: 50000.0000 [IU] | ORAL_CAPSULE | ORAL | 0 refills | Status: DC

## 2017-01-16 NOTE — Progress Notes (Signed)
Office: [phone]  /  Fax: [phone]   HPI:   Chief Complaint: OBESITY [name] is here to discuss her progress with her obesity treatment plan. She is on the  follow the Category 2 plan and is following her eating plan approximately 90 % of the time. She states she is exercising 0 minutes 0 times per week. [name] continues to do well with weight loss. She is making better choices overall and has stopped drinking Pepsi. She has had increased celebration eating in the last 2 weeks but tried to portion control. Her weight is 258 lb (117 kg) today and has had a weight loss of 1 pound over a period of 2 to 3 weeks since her last visit. She has lost 22 lbs since starting treatment with [name].  Vitamin D deficiency [name] has a diagnosis of vitamin D deficiency. She is currently stable on vit D and denies nausea, vomiting or muscle weakness.  Hypertension [name] is a 71 y.o. female with hypertension, her blood pressure is stable today on HCTZ. She had blood pressure increase while having foot pain recently but is back down now.  [name] denies chest pain or shortness of breath on exertion. She is working weight loss to help control her blood pressure with the goal of decreasing her risk of heart attack and stroke. [name] blood pressure is currently controlled.    ALLERGIES: Allergies  Allergen Reactions  . Cymbalta [Duloxetine Hcl] Other (See Comments)    Stomach pain  . Gabapentin   . Lipitor [Atorvastatin] Other (See Comments)    Cramps  . Maxzide [Hydrochlorothiazide W-Triamterene] Other (See Comments)    Cramping   . Pravastatin Sodium Other (See Comments)    Headache   . Simvastatin Other (See Comments)    Memory loss  . Lodine [Etodolac] Itching    MEDICATIONS: Current Outpatient Prescriptions on File Prior to Visit  Medication Sig Dispense Refill  . amitriptyline (ELAVIL) 25 MG tablet Take 25 mg by mouth at bedtime.    [name] aspirin 81 MG tablet Take 81 mg  by mouth daily.    [name] escitalopram (LEXAPRO) 10 MG tablet Take 10 mg by mouth daily.    . irbesartan (AVAPRO) 300 MG tablet Take 300 mg by mouth at bedtime.   0  . ketoconazole (NIZORAL) 2 % cream Apply 1 application topically daily. 15 g 0  . niacin 500 MG CR capsule Take 500 mg by mouth 2 (two) times daily with a meal.    . Omega-3 Fatty Acids (FISH OIL) 1200 MG CAPS Take 1,200 mg by mouth 2 (two) times daily.    . ondansetron (ZOFRAN) 4 MG tablet Take 1 tablet (4 mg total) by mouth every 6 (six) hours. 12 tablet 0  . tamoxifen (NOLVADEX) 20 MG tablet TAKE 1 TABLET BY MOUTH  DAILY 90 tablet 0  . verapamil (VERELAN PM) 240 MG 24 hr capsule Take 240 mg by mouth daily.     . vitamin B-12 (CYANOCOBALAMIN) 1000 MCG tablet Take 1,000 mcg by mouth daily.    . Vitamin D, Cholecalciferol, 400 UNITS TABS Take 400 Units by mouth at bedtime.     No current facility-administered medications on file prior to visit.     PAST MEDICAL HISTORY: Past Medical History:  Diagnosis Date  . Anemia   . Anxiety    takes Xanax daily  . Arthritis   . Back pain   . Blood transfusion   . Breast cancer ([place]) 2013  left breast  . Bronchitis    hx of  . Chronic fatigue syndrome   . Depression    takes Lexapro daily  . Diabetes mellitus without complication ([place])    boderline  . Diverticulosis   . Edema    feet and legs  . Headache(784.0)   . History of colon polyps 1998   adenomatous  . Hyperlipidemia    takes Niacin daily  . Hypertension    takes Verapamil and Avapro daily  . Joint pain   . OSA (obstructive sleep apnea)   . Pneumonia 4/12   Albuterol daily as needed  . Radiation 10/29/11-11/24/11   left breast 6100 cGy  . Shortness of breath    with exertion  . Spinal stenosis   . Tinnitus     PAST SURGICAL HISTORY: Past Surgical History:  Procedure Laterality Date  . APPENDECTOMY  1978  . BACK SURGERY    . BELPHAROPTOSIS REPAIR    . BREAST LUMPECTOMY  08/2011   left  . BREAST  LUMPECTOMY WITH NEEDLE LOCALIZATION Right 02/06/2013   Procedure: RIGHT BREAST LUMPECTOMY WITH NEEDLE LOCALIZATION;  Surgeon: Harl Bowie, MD;  Location: [facility];  Service: General;  Laterality: Right;  . COLONOSCOPY W/ POLYPECTOMY    . COLONOSCOPY WITH PROPOFOL N/A 10/27/2015   Procedure: COLONOSCOPY WITH PROPOFOL;  Surgeon: Mauri Pole, MD;  Location: [facility];  Service: Endoscopy;  Laterality: N/A;  . HARDWARE REMOVAL Left 04/20/2015   Procedure: HARDWARE REMOVAL LEFT LUMBAR FIVE SCREW;  Surgeon: Karie Chimera, MD;  Location: [facility];  Service: Neurosurgery;  Laterality: Left;  . JOINT REPLACEMENT Bilateral    bilateral knee  . POLYPECTOMY  1990's  . THYROID CYST EXCISION  1994  . TOTAL KNEE ARTHROPLASTY  2006   bilateral  . TRANSPHENOIDAL / TRANSNASAL HYPOPHYSECTOMY / RESECTION PITUITARY TUMOR  6/11    SOCIAL HISTORY: Social History  Substance Use Topics  . Smoking status: Never Smoker  . Smokeless tobacco: Never Used  . Alcohol use No    FAMILY HISTORY: Family History  Problem Relation Age of Onset  . Heart disease Father   . Clotting disorder Father   . Stroke Father   . Hypertension Father   . Heart Problems Father   . Stroke Mother   . Diabetes Mother   . Hypertension Mother   . Hyperlipidemia Mother   . Obesity Mother   . Cancer Sister 45       Breast Cancer  . Colon cancer Neg Hx   . Neuropathy Neg Hx     ROS: Review of Systems  Constitutional: Positive for weight loss.  Respiratory: Negative for shortness of breath (on exertion).   Cardiovascular: Negative for chest pain.  Gastrointestinal: Negative for nausea and vomiting.  Musculoskeletal:       Negative muscle weakness    PHYSICAL EXAM: Blood pressure 137/72, pulse 77, temperature 98 F (36.7 C), temperature source Oral, height 5\' 6"  (1.676 m), weight 258 lb (117 kg), SpO2 98 %. Body mass index is 41.64 kg/m. Physical Exam  Constitutional: [name], place, and time.  She appears well-developed and well-nourished.  Cardiovascular: Normal rate.   Pulmonary/Chest: Effort normal.  Musculoskeletal: Normal range of motion.  Neurological: [name], place, and time.  Skin: Skin is warm and dry.  Psychiatric: She has a normal mood and affect. Her behavior is normal.  Vitals reviewed.   RECENT LABS AND TESTS: BMET    Component Value  Date/Time   NA 141 12/14/2016 1146   NA 140 06/01/2016 1143   K 4.2 12/14/2016 1146   K 4.0 06/01/2016 1143   CL 100 12/14/2016 1146   CL 105 12/09/2012 1423   CO2 22 12/14/2016 1146   CO2 26 06/01/2016 1143   GLUCOSE 96 12/14/2016 1146   GLUCOSE 139 (H) 08/21/2016 2027   GLUCOSE 121 06/01/2016 1143   GLUCOSE 177 (H) 12/09/2012 1423   BUN 18 12/14/2016 1146   BUN 12.5 06/01/2016 1143   CREATININE 1.15 (H) 12/14/2016 1146   CREATININE 1.1 06/01/2016 1143   CALCIUM 10.7 (H) 12/14/2016 1146   CALCIUM 10.1 06/01/2016 1143   GFRNONAA 47 (L) 12/14/2016 1146   GFRAA 55 (L) 12/14/2016 1146   Lab Results  Component Value Date   HGBA1C 6.2 (H) 12/14/2016   HGBA1C 6.3 (H) 08/31/2016   HGBA1C (H) 11/11/2010    6.7 (NOTE)                                                                       According to the ADA Clinical Practice Recommendations for 2011, when HbA1c is used as a screening test:   >=6.5%   Diagnostic of Diabetes Mellitus           (if abnormal result  is confirmed)  5.7-6.4%   Increased risk of developing Diabetes Mellitus  References:Diagnosis and Classification of Diabetes Mellitus,Diabetes XFGH,8299,37(JIRCV 1):S62-S69 and Standards of Medical Care in         Diabetes - 2011,Diabetes Care,2011,34  (Suppl 1):S11-S61.   Lab Results  Component Value Date   INSULIN 20.9 12/14/2016   INSULIN 23.5 08/31/2016   CBC    Component Value Date/Time   WBC 7.4 12/14/2016 1146   WBC 14.5 (H) 08/21/2016 2027   RBC 4.25 12/14/2016 1146   RBC 4.46 08/21/2016 2027   HGB 11.0 (L) 12/14/2016 1146   HGB  11.1 (L) 06/01/2016 1143   HCT 33.9 (L) 12/14/2016 1146   HCT 34.9 06/01/2016 1143   PLT 267 08/21/2016 2027   PLT 248 06/01/2016 1143   MCV 80 12/14/2016 1146   MCV 81.2 06/01/2016 1143   MCH 25.9 (L) 12/14/2016 1146   MCH 26.5 08/21/2016 2027   MCHC 32.4 12/14/2016 1146   MCHC 33.1 08/21/2016 2027   RDW 16.4 (H) 12/14/2016 1146   RDW 15.8 (H) 06/01/2016 1143   LYMPHSABS 1.5 12/14/2016 1146   LYMPHSABS 1.8 06/01/2016 1143   MONOABS 0.6 06/01/2016 1143   EOSABS 0.1 12/14/2016 1146   BASOSABS 0.0 12/14/2016 1146   BASOSABS 0.0 06/01/2016 1143   Iron/TIBC/Ferritin/ %Sat    Component Value Date/Time   IRON 55 12/14/2016 1146   TIBC 329 12/14/2016 1146   FERRITIN 479 (H) 12/14/2016 1146   FERRITIN 362 (H) 04/15/2015 1058   IRONPCTSAT 17 12/14/2016 1146   Lipid Panel     Component Value Date/Time   CHOL 240 (H) 12/14/2016 1146   TRIG 184 (H) 12/14/2016 1146   HDL 43 12/14/2016 1146   CHOLHDL 6.4 11/11/2010 0555   VLDL 17 11/11/2010 0555   LDLCALC 160 (H) 12/14/2016 1146   Hepatic Function Panel     Component Value Date/Time   PROT 7.3 12/14/2016 1146  PROT 7.6 06/01/2016 1143   ALBUMIN 4.1 12/14/2016 1146   ALBUMIN 3.2 (L) 06/01/2016 1143   AST 10 12/14/2016 1146   AST 11 06/01/2016 1143   ALT 10 12/14/2016 1146   ALT 12 06/01/2016 1143   ALKPHOS 68 12/14/2016 1146   ALKPHOS 69 06/01/2016 1143   BILITOT 0.3 12/14/2016 1146   BILITOT 0.28 06/01/2016 1143     ASSESSMENT AND PLAN: Essential hypertension - Plan: hydrochlorothiazide (HYDRODIURIL) 12.5 MG tablet  Vitamin D deficiency - Plan: Vitamin D, Ergocalciferol, (DRISDOL) 50000 units CAPS capsule  Morbid obesity (HCC) - BMI 41.8  PLAN:  Vitamin D Deficiency [name] was informed that low vitamin D levels contributes to fatigue and are associated with obesity, breast, and colon cancer. She agrees to continue to take prescription Vit D @50 ,000 IU every week, we will refill for 1 month and will follow up for  routine testing of vitamin D, at least 2-3 times per year. She was informed of the risk of over-replacement of vitamin D and agrees to not increase her dose unless he discusses this with [name] first. [name] agrees to follow up with our clinic in 3 weeks.  Hypertension We discussed sodium restriction, working on healthy weight loss, and a regular exercise program as the means to achieve improved blood pressure control. [name] agreed with this plan and agreed to follow up as directed. We will continue to monitor her blood pressure as well as her progress with the above lifestyle modifications. She agrees to continue HCTZ, we will refill for 1 month and will watch for signs of hypotension as she continues her lifestyle modifications.  Obesity [name] is currently in the action stage of change. As such, her goal is to continue with weight loss efforts She has agreed to follow the Category 2 plan [name] has been instructed to work up to a goal of 150 minutes of combined cardio and strengthening exercise per week for weight loss and overall health benefits. We discussed the following Behavioral Modification Strategies today: increasing lean protein intake, increasing vegetables and decrease eating out  [name] has agreed to follow up with our clinic in 3 weeks. She was informed of the importance of frequent follow up visits to maximize her success with intensive lifestyle modifications for her multiple health conditions.  I, Doreene Nest, am acting as transcriptionist for Dennard Nip, MD  I have reviewed the above documentation for accuracy and completeness, and I agree with the above. -Dennard Nip, MD   OBESITY BEHAVIORAL INTERVENTION VISIT  Today's visit was # 10 out of 22.  Starting weight: 280 lbs Starting date: 08/31/16 Today's weight : 258 lbs Today's date: 01/16/2017 Total lbs lost to date: 51 (Patients must lose 7 lbs in the first 6 months to continue with counseling)   ASK: We discussed  the diagnosis of obesity with [name] today and [name] agreed to give [name] permission to discuss obesity behavioral modification therapy today.  ASSESS: [name] has the diagnosis of obesity and her BMI today is 41.7 [name] is in the action stage of change   ADVISE: [name] was educated on the multiple health risks of obesity as well as the benefit of weight loss to improve her health. She was advised of the need for long term treatment and the importance of lifestyle modifications.  AGREE: Multiple dietary modification options and treatment options were discussed and  [name] agreed to follow the Category 2 plan We discussed the following Behavioral Modification Strategies today: increasing lean protein intake,  increasing vegetables and decrease eating out

## 2017-01-18 ENCOUNTER — Ambulatory Visit: Payer: Medicare Other | Admitting: Adult Health

## 2017-01-23 DIAGNOSIS — H25812 Combined forms of age-related cataract, left eye: Secondary | ICD-10-CM | POA: Diagnosis not present

## 2017-01-23 DIAGNOSIS — H2512 Age-related nuclear cataract, left eye: Secondary | ICD-10-CM | POA: Diagnosis not present

## 2017-02-05 ENCOUNTER — Ambulatory Visit (INDEPENDENT_AMBULATORY_CARE_PROVIDER_SITE_OTHER): Payer: Medicare Other | Admitting: Family Medicine

## 2017-02-05 VITALS — BP 124/67 | HR 73 | Temp 98.1°F | Ht 66.0 in | Wt 259.0 lb

## 2017-02-05 DIAGNOSIS — E669 Obesity, unspecified: Secondary | ICD-10-CM

## 2017-02-05 DIAGNOSIS — Z6841 Body Mass Index (BMI) 40.0 and over, adult: Secondary | ICD-10-CM | POA: Diagnosis not present

## 2017-02-05 DIAGNOSIS — I10 Essential (primary) hypertension: Secondary | ICD-10-CM | POA: Diagnosis not present

## 2017-02-05 DIAGNOSIS — E559 Vitamin D deficiency, unspecified: Secondary | ICD-10-CM

## 2017-02-05 DIAGNOSIS — G4739 Other sleep apnea: Secondary | ICD-10-CM

## 2017-02-05 DIAGNOSIS — IMO0001 Reserved for inherently not codable concepts without codable children: Secondary | ICD-10-CM

## 2017-02-05 MED ORDER — VITAMIN D (ERGOCALCIFEROL) 1.25 MG (50000 UNIT) PO CAPS: 50000.0000 [IU] | ORAL_CAPSULE | ORAL | 0 refills | Status: DC

## 2017-02-05 MED ORDER — HYDROCHLOROTHIAZIDE 12.5 MG PO TABS: 12.5000 mg | ORAL_TABLET | Freq: Every day | ORAL | 0 refills | Status: DC

## 2017-02-05 NOTE — Progress Notes (Signed)
Office: [phone]  /  Fax: [phone]   HPI:   Chief Complaint: OBESITY [name] is here to discuss her progress with her obesity treatment plan. She is on the  follow the Category 2 plan and is following her eating plan approximately 75 % of the time. She states she is exercising 0 minutes 0 times per week. [name] noted increased celebration eating in the last 2 weeks but is ready to get back on track. Her weight is 259 lb (117.5 kg) today and has had a weight gain of 1 pound over a period of 3 weeks since her last visit. She has lost 21 lbs since starting treatment with [name].  Vitamin D deficiency [name] has a diagnosis of vitamin D deficiency. She is currently stable on vit D and denies nausea, vomiting or muscle weakness.  Hypertension [name] is a 71 y.o. female with hypertension.  [name] denies chest pain, headache or shortness of breath on exertion. She is working weight loss to help control her blood pressure with the goal of decreasing her risk of heart attack and stroke. [name] blood pressure is currently well controlled.  Sleep Apnea [name] had done better using CPAP but she stopped last week and is not sleeping as well.   ALLERGIES: Allergies  Allergen Reactions  . Cymbalta [Duloxetine Hcl] Other (See Comments)    Stomach pain  . Gabapentin   . Lipitor [Atorvastatin] Other (See Comments)    Cramps  . Maxzide [Hydrochlorothiazide W-Triamterene] Other (See Comments)    Cramping   . Pravastatin Sodium Other (See Comments)    Headache   . Simvastatin Other (See Comments)    Memory loss  . Lodine [Etodolac] Itching    MEDICATIONS: Current Outpatient Prescriptions on File Prior to Visit  Medication Sig Dispense Refill  . amitriptyline (ELAVIL) 25 MG tablet Take 25 mg by mouth at bedtime.    [name] aspirin 81 MG tablet Take 81 mg by mouth daily.    [name] escitalopram (LEXAPRO) 10 MG tablet Take 10 mg by mouth daily.    . irbesartan (AVAPRO) 300 MG tablet  Take 300 mg by mouth at bedtime.   0  . ketoconazole (NIZORAL) 2 % cream Apply 1 application topically daily. 15 g 0  . niacin 500 MG CR capsule Take 500 mg by mouth 2 (two) times daily with a meal.    . Omega-3 Fatty Acids (FISH OIL) 1200 MG CAPS Take 1,200 mg by mouth 2 (two) times daily.    . ondansetron (ZOFRAN) 4 MG tablet Take 1 tablet (4 mg total) by mouth every 6 (six) hours. 12 tablet 0  . tamoxifen (NOLVADEX) 20 MG tablet TAKE 1 TABLET BY MOUTH  DAILY 90 tablet 0  . verapamil (VERELAN PM) 240 MG 24 hr capsule Take 240 mg by mouth daily.     . vitamin B-12 (CYANOCOBALAMIN) 1000 MCG tablet Take 1,000 mcg by mouth daily.    . Vitamin D, Cholecalciferol, 400 UNITS TABS Take 400 Units by mouth at bedtime.     No current facility-administered medications on file prior to visit.     PAST MEDICAL HISTORY: Past Medical History:  Diagnosis Date  . Anemia   . Anxiety    takes Xanax daily  . Arthritis   . Back pain   . Blood transfusion   . Breast cancer ([facility]) 2013   left breast  . Bronchitis    hx of  . Chronic fatigue syndrome   . Depression  takes Lexapro daily  . Diabetes mellitus without complication ([place])    boderline  . Diverticulosis   . Edema    feet and legs  . Headache(784.0)   . History of colon polyps 1998   adenomatous  . Hyperlipidemia    takes Niacin daily  . Hypertension    takes Verapamil and Avapro daily  . Joint pain   . OSA (obstructive sleep apnea)   . Pneumonia 4/12   Albuterol daily as needed  . Radiation 10/29/11-11/24/11   left breast 6100 cGy  . Shortness of breath    with exertion  . Spinal stenosis   . Tinnitus     PAST SURGICAL HISTORY: Past Surgical History:  Procedure Laterality Date  . APPENDECTOMY  1978  . BACK SURGERY    . BELPHAROPTOSIS REPAIR    . BREAST LUMPECTOMY  08/2011   left  . BREAST LUMPECTOMY WITH NEEDLE LOCALIZATION Right 02/06/2013   Procedure: RIGHT BREAST LUMPECTOMY WITH NEEDLE LOCALIZATION;  Surgeon: Harl Bowie, MD;  Location: [facility];  Service: General;  Laterality: Right;  . COLONOSCOPY W/ POLYPECTOMY    . COLONOSCOPY WITH PROPOFOL N/A 10/27/2015   Procedure: COLONOSCOPY WITH PROPOFOL;  Surgeon: Mauri Pole, MD;  Location: [facility];  Service: Endoscopy;  Laterality: N/A;  . HARDWARE REMOVAL Left 04/20/2015   Procedure: HARDWARE REMOVAL LEFT LUMBAR FIVE SCREW;  Surgeon: Karie Chimera, MD;  Location: [facility];  Service: Neurosurgery;  Laterality: Left;  . JOINT REPLACEMENT Bilateral    bilateral knee  . POLYPECTOMY  1990's  . THYROID CYST EXCISION  1994  . TOTAL KNEE ARTHROPLASTY  2006   bilateral  . TRANSPHENOIDAL / TRANSNASAL HYPOPHYSECTOMY / RESECTION PITUITARY TUMOR  6/11    SOCIAL HISTORY: Social History  Substance Use Topics  . Smoking status: Never Smoker  . Smokeless tobacco: Never Used  . Alcohol use No    FAMILY HISTORY: Family History  Problem Relation Age of Onset  . Heart disease Father   . Clotting disorder Father   . Stroke Father   . Hypertension Father   . Heart Problems Father   . Stroke Mother   . Diabetes Mother   . Hypertension Mother   . Hyperlipidemia Mother   . Obesity Mother   . Cancer Sister 6       Breast Cancer  . Colon cancer Neg Hx   . Neuropathy Neg Hx     ROS: Review of Systems  Constitutional: Negative for weight loss.  Respiratory: Negative for shortness of breath (on exertion).   Cardiovascular: Negative for chest pain.  Gastrointestinal: Negative for nausea and vomiting.  Musculoskeletal:       Negative muscle weakness  Neurological: Negative for headaches.  Psychiatric/Behavioral: The patient has insomnia.     PHYSICAL EXAM: Blood pressure 124/67, pulse 73, temperature 98.1 F (36.7 C), temperature source Oral, height 5\' 6"  (1.676 m), weight 259 lb (117.5 kg), SpO2 100 %. Body mass index is 41.8 kg/m. Physical Exam  Constitutional: [name], place, and time. She appears well-developed and  well-nourished.  Cardiovascular: Normal rate.   Pulmonary/Chest: Effort normal.  Musculoskeletal: Normal range of motion.  Neurological: [name], place, and time.  Skin: Skin is warm and dry.  Psychiatric: She has a normal mood and affect. Her behavior is normal.  Vitals reviewed.   RECENT LABS AND TESTS: BMET    Component Value Date/Time   NA 141 12/14/2016 1146   NA  140 06/01/2016 1143   K 4.2 12/14/2016 1146   K 4.0 06/01/2016 1143   CL 100 12/14/2016 1146   CL 105 12/09/2012 1423   CO2 22 12/14/2016 1146   CO2 26 06/01/2016 1143   GLUCOSE 96 12/14/2016 1146   GLUCOSE 139 (H) 08/21/2016 2027   GLUCOSE 121 06/01/2016 1143   GLUCOSE 177 (H) 12/09/2012 1423   BUN 18 12/14/2016 1146   BUN 12.5 06/01/2016 1143   CREATININE 1.15 (H) 12/14/2016 1146   CREATININE 1.1 06/01/2016 1143   CALCIUM 10.7 (H) 12/14/2016 1146   CALCIUM 10.1 06/01/2016 1143   GFRNONAA 47 (L) 12/14/2016 1146   GFRAA 55 (L) 12/14/2016 1146   Lab Results  Component Value Date   HGBA1C 6.2 (H) 12/14/2016   HGBA1C 6.3 (H) 08/31/2016   HGBA1C (H) 11/11/2010    6.7 (NOTE)                                                                       According to the ADA Clinical Practice Recommendations for 2011, when HbA1c is used as a screening test:   >=6.5%   Diagnostic of Diabetes Mellitus           (if abnormal result  is confirmed)  5.7-6.4%   Increased risk of developing Diabetes Mellitus  References:Diagnosis and Classification of Diabetes Mellitus,Diabetes VFIE,3329,51(OACZY 1):S62-S69 and Standards of Medical Care in         Diabetes - 2011,Diabetes Care,2011,34  (Suppl 1):S11-S61.   Lab Results  Component Value Date   INSULIN 20.9 12/14/2016   INSULIN 23.5 08/31/2016   CBC    Component Value Date/Time   WBC 7.4 12/14/2016 1146   WBC 14.5 (H) 08/21/2016 2027   RBC 4.25 12/14/2016 1146   RBC 4.46 08/21/2016 2027   HGB 11.0 (L) 12/14/2016 1146   HGB 11.1 (L) 06/01/2016 1143    HCT 33.9 (L) 12/14/2016 1146   HCT 34.9 06/01/2016 1143   PLT 267 08/21/2016 2027   PLT 248 06/01/2016 1143   MCV 80 12/14/2016 1146   MCV 81.2 06/01/2016 1143   MCH 25.9 (L) 12/14/2016 1146   MCH 26.5 08/21/2016 2027   MCHC 32.4 12/14/2016 1146   MCHC 33.1 08/21/2016 2027   RDW 16.4 (H) 12/14/2016 1146   RDW 15.8 (H) 06/01/2016 1143   LYMPHSABS 1.5 12/14/2016 1146   LYMPHSABS 1.8 06/01/2016 1143   MONOABS 0.6 06/01/2016 1143   EOSABS 0.1 12/14/2016 1146   BASOSABS 0.0 12/14/2016 1146   BASOSABS 0.0 06/01/2016 1143   Iron/TIBC/Ferritin/ %Sat    Component Value Date/Time   IRON 55 12/14/2016 1146   TIBC 329 12/14/2016 1146   FERRITIN 479 (H) 12/14/2016 1146   FERRITIN 362 (H) 04/15/2015 1058   IRONPCTSAT 17 12/14/2016 1146   Lipid Panel     Component Value Date/Time   CHOL 240 (H) 12/14/2016 1146   TRIG 184 (H) 12/14/2016 1146   HDL 43 12/14/2016 1146   CHOLHDL 6.4 11/11/2010 0555   VLDL 17 11/11/2010 0555   LDLCALC 160 (H) 12/14/2016 1146   Hepatic Function Panel     Component Value Date/Time   PROT 7.3 12/14/2016 1146   PROT 7.6 06/01/2016 1143   ALBUMIN 4.1 12/14/2016 1146  ALBUMIN 3.2 (L) 06/01/2016 1143   AST 10 12/14/2016 1146   AST 11 06/01/2016 1143   ALT 10 12/14/2016 1146   ALT 12 06/01/2016 1143   ALKPHOS 68 12/14/2016 1146   ALKPHOS 69 06/01/2016 1143   BILITOT 0.3 12/14/2016 1146   BILITOT 0.28 06/01/2016 1143     ASSESSMENT AND PLAN: Other sleep apnea  Essential hypertension - Plan: hydrochlorothiazide (HYDRODIURIL) 12.5 MG tablet  Vitamin D deficiency - Plan: Vitamin D, Ergocalciferol, (DRISDOL) 50000 units CAPS capsule  Class 3 obesity with serious comorbidity and body mass index (BMI) of 40.0 to 44.9 in adult, unspecified obesity type (HCC)  PLAN:  Vitamin D Deficiency [name] was informed that low vitamin D levels contributes to fatigue and are associated with obesity, breast, and colon cancer. She agrees to continue to take  prescription Vit D @50 ,000 IU every week, we will refill for 1 month and will follow up for routine testing of vitamin D, at least 2-3 times per year. She was informed of the risk of over-replacement of vitamin D and agrees to not increase her dose unless he discusses this with [name] first. [name] agrees to follow up with our clinic in 2 to 3 weeks.  Hypertension We discussed sodium restriction, working on healthy weight loss, and a regular exercise program as the means to achieve improved blood pressure control. [name] agreed with this plan and agreed to follow up as directed. We will continue to monitor her blood pressure as well as her progress with the above lifestyle modifications. [name] agrees to continue HCTZ, we will refill for 1 month and she will watch for signs of hypotension as she continues her lifestyle modifications.  Sleep Apnea [name] was encouraged to continue losing weight and wear CPAP every night. [name] agrees to follow up with our clinic in 2 to 3 weeks.  Obesity [name] is currently in the action stage of change. As such, her goal is to continue with weight loss efforts She has agreed to follow the Category 2 plan [name] has been instructed to work up to a goal of 150 minutes of combined cardio and strengthening exercise per week for weight loss and overall health benefits. We discussed the following Behavioral Modification Strategies today: no skipping meals, increasing lean protein intake and decreasing simple carbohydrates   [name] has agreed to follow up with our clinic in 2 to 3 weeks. She was informed of the importance of frequent follow up visits to maximize her success with intensive lifestyle modifications for her multiple health conditions.  I, Doreene Nest, am acting as transcriptionist for Dennard Nip, MD  I have reviewed the above documentation for accuracy and completeness, and I agree with the above. -Dennard Nip, MD  OBESITY BEHAVIORAL INTERVENTION  VISIT  Today's visit was # 11 out of 22.  Starting weight: 280 lbs Starting date: 08/31/16 Today's weight : 259 lbs Today's date: 02/05/2017 Total lbs lost to date: 21 (Patients must lose 7 lbs in the first 6 months to continue with counseling)   ASK: We discussed the diagnosis of obesity with [name] today and [name] agreed to give [name] permission to discuss obesity behavioral modification therapy today.  ASSESS: [name] has the diagnosis of obesity and her BMI today is 41.9 [name] is in the action stage of change   ADVISE: [name] was educated on the multiple health risks of obesity as well as the benefit of weight loss to improve her health. She was advised of the need for long  term treatment and the importance of lifestyle modifications.  AGREE: Multiple dietary modification options and treatment options were discussed and  [name] agreed to follow the Category 2 plan We discussed the following Behavioral Modification Strategies today: no skipping meals, increasing lean protein intake and decreasing simple carbohydrates

## 2017-02-12 DIAGNOSIS — H25011 Cortical age-related cataract, right eye: Secondary | ICD-10-CM | POA: Diagnosis not present

## 2017-02-12 DIAGNOSIS — H2511 Age-related nuclear cataract, right eye: Secondary | ICD-10-CM | POA: Diagnosis not present

## 2017-02-19 ENCOUNTER — Ambulatory Visit (INDEPENDENT_AMBULATORY_CARE_PROVIDER_SITE_OTHER): Payer: Medicare Other | Admitting: Family Medicine

## 2017-02-19 VITALS — BP 138/69 | HR 77 | Temp 98.2°F | Ht 66.0 in | Wt 257.0 lb

## 2017-02-19 DIAGNOSIS — I10 Essential (primary) hypertension: Secondary | ICD-10-CM | POA: Diagnosis not present

## 2017-02-19 DIAGNOSIS — E669 Obesity, unspecified: Secondary | ICD-10-CM

## 2017-02-19 DIAGNOSIS — Z6841 Body Mass Index (BMI) 40.0 and over, adult: Secondary | ICD-10-CM | POA: Diagnosis not present

## 2017-02-19 DIAGNOSIS — IMO0001 Reserved for inherently not codable concepts without codable children: Secondary | ICD-10-CM

## 2017-02-20 NOTE — Progress Notes (Signed)
Office: [phone]  /  Fax: [phone]   HPI:   Chief Complaint: OBESITY [name] is here to discuss her progress with her obesity treatment plan. She is on the  follow the Category 2 plan and is following her eating plan approximately 85 % of the time. She states she is exercising 0 minutes 0 times per week. [name] continues to do well with weight loss even with increased celebration eating. She is not following the category 2 plan but is mostly doing portion control/smart choices. She is eating cereal most mornings. She hasn't started exercising yet.  Her weight is 257 lb (116.6 kg) today and has had a weight loss of 2 pounds over a period of 2 weeks since her last visit. She has lost 23 lbs since starting treatment with [name].  Hypertension [name] is a 70 y.o. female with hypertension. Her blood pressure is improved with diet and weight loss. [name] denies chest pain or headache on irbesartan and verapamil and she denies lightheadedness. She is working weight loss to help control her blood pressure with the goal of decreasing her risk of heart attack and stroke. [name] blood pressure is currently controlled.   ALLERGIES: Allergies  Allergen Reactions  . Cymbalta [Duloxetine Hcl] Other (See Comments)    Stomach pain  . Gabapentin   . Lipitor [Atorvastatin] Other (See Comments)    Cramps  . Maxzide [Hydrochlorothiazide W-Triamterene] Other (See Comments)    Cramping   . Pravastatin Sodium Other (See Comments)    Headache   . Simvastatin Other (See Comments)    Memory loss  . Lodine [Etodolac] Itching    MEDICATIONS: Current Outpatient Prescriptions on File Prior to Visit  Medication Sig Dispense Refill  . amitriptyline (ELAVIL) 25 MG tablet Take 25 mg by mouth at bedtime.    [name] aspirin 81 MG tablet Take 81 mg by mouth daily.    [name] escitalopram (LEXAPRO) 10 MG tablet Take 10 mg by mouth daily.    . hydrochlorothiazide (HYDRODIURIL) 12.5 MG tablet Take 1  tablet (12.5 mg total) by mouth daily. 30 tablet 0  . irbesartan (AVAPRO) 300 MG tablet Take 300 mg by mouth at bedtime.   0  . ketoconazole (NIZORAL) 2 % cream Apply 1 application topically daily. 15 g 0  . niacin 500 MG CR capsule Take 500 mg by mouth 2 (two) times daily with a meal.    . Omega-3 Fatty Acids (FISH OIL) 1200 MG CAPS Take 1,200 mg by mouth 2 (two) times daily.    . ondansetron (ZOFRAN) 4 MG tablet Take 1 tablet (4 mg total) by mouth every 6 (six) hours. 12 tablet 0  . tamoxifen (NOLVADEX) 20 MG tablet TAKE 1 TABLET BY MOUTH  DAILY 90 tablet 0  . verapamil (VERELAN PM) 240 MG 24 hr capsule Take 240 mg by mouth daily.     . vitamin B-12 (CYANOCOBALAMIN) 1000 MCG tablet Take 1,000 mcg by mouth daily.    . Vitamin D, Cholecalciferol, 400 UNITS TABS Take 400 Units by mouth at bedtime.    . Vitamin D, Ergocalciferol, (DRISDOL) 50000 units CAPS capsule Take 1 capsule (50,000 Units total) by mouth every 7 (seven) days. 4 capsule 0   No current facility-administered medications on file prior to visit.     PAST MEDICAL HISTORY: Past Medical History:  Diagnosis Date  . Anemia   . Anxiety    takes Xanax daily  . Arthritis   . Back pain   . Blood  transfusion   . Breast cancer ([place]) 2013   left breast  . Bronchitis    hx of  . Chronic fatigue syndrome   . Depression    takes Lexapro daily  . Diabetes mellitus without complication ([place])    boderline  . Diverticulosis   . Edema    feet and legs  . Headache(784.0)   . History of colon polyps 1998   adenomatous  . Hyperlipidemia    takes Niacin daily  . Hypertension    takes Verapamil and Avapro daily  . Joint pain   . OSA (obstructive sleep apnea)   . Pneumonia 4/12   Albuterol daily as needed  . Radiation 10/29/11-11/24/11   left breast 6100 cGy  . Shortness of breath    with exertion  . Spinal stenosis   . Tinnitus     PAST SURGICAL HISTORY: Past Surgical History:  Procedure Laterality Date  . APPENDECTOMY   1978  . BACK SURGERY    . BELPHAROPTOSIS REPAIR    . BREAST LUMPECTOMY  08/2011   left  . BREAST LUMPECTOMY WITH NEEDLE LOCALIZATION Right 02/06/2013   Procedure: RIGHT BREAST LUMPECTOMY WITH NEEDLE LOCALIZATION;  Surgeon: Harl Bowie, MD;  Location: [facility];  Service: General;  Laterality: Right;  . COLONOSCOPY W/ POLYPECTOMY    . COLONOSCOPY WITH PROPOFOL N/A 10/27/2015   Procedure: COLONOSCOPY WITH PROPOFOL;  Surgeon: Mauri Pole, MD;  Location: [facility];  Service: Endoscopy;  Laterality: N/A;  . HARDWARE REMOVAL Left 04/20/2015   Procedure: HARDWARE REMOVAL LEFT LUMBAR FIVE SCREW;  Surgeon: Karie Chimera, MD;  Location: [facility];  Service: Neurosurgery;  Laterality: Left;  . JOINT REPLACEMENT Bilateral    bilateral knee  . POLYPECTOMY  1990's  . THYROID CYST EXCISION  1994  . TOTAL KNEE ARTHROPLASTY  2006   bilateral  . TRANSPHENOIDAL / TRANSNASAL HYPOPHYSECTOMY / RESECTION PITUITARY TUMOR  6/11    SOCIAL HISTORY: Social History  Substance Use Topics  . Smoking status: Never Smoker  . Smokeless tobacco: Never Used  . Alcohol use No    FAMILY HISTORY: Family History  Problem Relation Age of Onset  . Heart disease Father   . Clotting disorder Father   . Stroke Father   . Hypertension Father   . Heart Problems Father   . Stroke Mother   . Diabetes Mother   . Hypertension Mother   . Hyperlipidemia Mother   . Obesity Mother   . Cancer Sister 76       Breast Cancer  . Colon cancer Neg Hx   . Neuropathy Neg Hx     ROS: Review of Systems  Constitutional: Positive for weight loss.  Cardiovascular: Negative for chest pain.    PHYSICAL EXAM: Blood pressure 138/69, pulse 77, temperature 98.2 F (36.8 C), temperature source Oral, height 5\' 6"  (1.676 m), weight 257 lb (116.6 kg), SpO2 100 %. Body mass index is 41.48 kg/m. Physical Exam  Constitutional: [name], place, and time. She appears well-developed and well-nourished.    Cardiovascular: Normal rate.   Pulmonary/Chest: Effort normal.  Musculoskeletal: Normal range of motion.  Neurological: [name], place, and time.  Skin: Skin is warm and dry.  Psychiatric: She has a normal mood and affect. Her behavior is normal.  Vitals reviewed.   RECENT LABS AND TESTS: BMET    Component Value Date/Time   NA 141 12/14/2016 1146   NA 140 06/01/2016 1143   K 4.2  12/14/2016 1146   K 4.0 06/01/2016 1143   CL 100 12/14/2016 1146   CL 105 12/09/2012 1423   CO2 22 12/14/2016 1146   CO2 26 06/01/2016 1143   GLUCOSE 96 12/14/2016 1146   GLUCOSE 139 (H) 08/21/2016 2027   GLUCOSE 121 06/01/2016 1143   GLUCOSE 177 (H) 12/09/2012 1423   BUN 18 12/14/2016 1146   BUN 12.5 06/01/2016 1143   CREATININE 1.15 (H) 12/14/2016 1146   CREATININE 1.1 06/01/2016 1143   CALCIUM 10.7 (H) 12/14/2016 1146   CALCIUM 10.1 06/01/2016 1143   GFRNONAA 47 (L) 12/14/2016 1146   GFRAA 55 (L) 12/14/2016 1146   Lab Results  Component Value Date   HGBA1C 6.2 (H) 12/14/2016   HGBA1C 6.3 (H) 08/31/2016   HGBA1C (H) 11/11/2010    6.7 (NOTE)                                                                       According to the ADA Clinical Practice Recommendations for 2011, when HbA1c is used as a screening test:   >=6.5%   Diagnostic of Diabetes Mellitus           (if abnormal result  is confirmed)  5.7-6.4%   Increased risk of developing Diabetes Mellitus  References:Diagnosis and Classification of Diabetes Mellitus,Diabetes IOXB,3532,99(MEQAS 1):S62-S69 and Standards of Medical Care in         Diabetes - 2011,Diabetes Care,2011,34  (Suppl 1):S11-S61.   Lab Results  Component Value Date   INSULIN 20.9 12/14/2016   INSULIN 23.5 08/31/2016   CBC    Component Value Date/Time   WBC 7.4 12/14/2016 1146   WBC 14.5 (H) 08/21/2016 2027   RBC 4.25 12/14/2016 1146   RBC 4.46 08/21/2016 2027   HGB 11.0 (L) 12/14/2016 1146   HGB 11.1 (L) 06/01/2016 1143   HCT 33.9 (L)  12/14/2016 1146   HCT 34.9 06/01/2016 1143   PLT 267 08/21/2016 2027   PLT 248 06/01/2016 1143   MCV 80 12/14/2016 1146   MCV 81.2 06/01/2016 1143   MCH 25.9 (L) 12/14/2016 1146   MCH 26.5 08/21/2016 2027   MCHC 32.4 12/14/2016 1146   MCHC 33.1 08/21/2016 2027   RDW 16.4 (H) 12/14/2016 1146   RDW 15.8 (H) 06/01/2016 1143   LYMPHSABS 1.5 12/14/2016 1146   LYMPHSABS 1.8 06/01/2016 1143   MONOABS 0.6 06/01/2016 1143   EOSABS 0.1 12/14/2016 1146   BASOSABS 0.0 12/14/2016 1146   BASOSABS 0.0 06/01/2016 1143   Iron/TIBC/Ferritin/ %Sat    Component Value Date/Time   IRON 55 12/14/2016 1146   TIBC 329 12/14/2016 1146   FERRITIN 479 (H) 12/14/2016 1146   FERRITIN 362 (H) 04/15/2015 1058   IRONPCTSAT 17 12/14/2016 1146   Lipid Panel     Component Value Date/Time   CHOL 240 (H) 12/14/2016 1146   TRIG 184 (H) 12/14/2016 1146   HDL 43 12/14/2016 1146   CHOLHDL 6.4 11/11/2010 0555   VLDL 17 11/11/2010 0555   LDLCALC 160 (H) 12/14/2016 1146   Hepatic Function Panel     Component Value Date/Time   PROT 7.3 12/14/2016 1146   PROT 7.6 06/01/2016 1143   ALBUMIN 4.1 12/14/2016 1146   ALBUMIN 3.2 (L) 06/01/2016 1143  AST 10 12/14/2016 1146   AST 11 06/01/2016 1143   ALT 10 12/14/2016 1146   ALT 12 06/01/2016 1143   ALKPHOS 68 12/14/2016 1146   ALKPHOS 69 06/01/2016 1143   BILITOT 0.3 12/14/2016 1146   BILITOT 0.28 06/01/2016 1143     ASSESSMENT AND PLAN: Essential hypertension  Class 3 obesity with serious comorbidity and body mass index (BMI) of 40.0 to 44.9 in adult, unspecified obesity type (HCC)  PLAN:  Hypertension We discussed sodium restriction, working on healthy weight loss, and a regular exercise program as the means to achieve improved blood pressure control. [name] agreed with this plan and agreed to follow up as directed. We will continue to monitor her blood pressure as well as her progress with the above lifestyle modifications. She will continue her  medications as prescribed and will watch for signs of hypotension as she continues her lifestyle modifications. We may need to decrease dose as she continues to lose weight  We spent > than 50% of the 15 minute visit on the counseling as documented in the note.  Obesity [name] is currently in the action stage of change. As such, her goal is to continue with weight loss efforts She has agreed to portion control better and make smarter food choices, such as increase vegetables and decrease simple carbohydrates  [name] has been instructed to work up to a goal of 150 minutes of combined cardio and strengthening exercise per week or start exercise for 15 minutes 5 times per week for weight loss and overall health benefits. We discussed the following Behavioral Modification Strategies today: increase H2O intake, no skipping meals, meal planning & cooking strategies, increasing lean protein intake and increasing vegetables  [name] has agreed to follow up with our clinic in 3 weeks. She was informed of the importance of frequent follow up visits to maximize her success with intensive lifestyle modifications for her multiple health conditions.  I, [name], am acting as transcriptionist for [name] Nip, MD  I have reviewed the above documentation for accuracy and completeness, and I agree with the above. -[name] Nip, MD   OBESITY BEHAVIORAL INTERVENTION VISIT  Today's visit was # 12 out of 22.  Starting weight: 280 lbs Starting date: 08/31/16 Today's weight : 257 lbs Today's date: 02/19/2017 Total lbs lost to date: 23 (Patients must lose 7 lbs in the first 6 months to continue with counseling)   ASK: We discussed the diagnosis of obesity with [name] today and [name] agreed to give [name] permission to discuss obesity behavioral modification therapy today.  ASSESS: [name] has the diagnosis of obesity and her BMI today is 41.6 [name] is in the action stage of change   ADVISE: [name]  was educated on the multiple health risks of obesity as well as the benefit of weight loss to improve her health. She was advised of the need for long term treatment and the importance of lifestyle modifications.  AGREE: Multiple dietary modification options and treatment options were discussed and  [name] agreed to portion control better and make smarter food choices, such as increase vegetables and decrease simple carbohydrates  We discussed the following Behavioral Modification Strategies today: increase H2O intake, no skipping meals, meal planning & cooking strategies, increasing lean protein intake and increasing vegetables

## 2017-03-06 DIAGNOSIS — H2511 Age-related nuclear cataract, right eye: Secondary | ICD-10-CM | POA: Diagnosis not present

## 2017-03-06 DIAGNOSIS — H25811 Combined forms of age-related cataract, right eye: Secondary | ICD-10-CM | POA: Diagnosis not present

## 2017-03-12 ENCOUNTER — Ambulatory Visit (INDEPENDENT_AMBULATORY_CARE_PROVIDER_SITE_OTHER): Payer: Medicare Other | Admitting: Family Medicine

## 2017-03-12 VITALS — BP 115/70 | HR 82 | Temp 98.1°F | Ht 66.0 in | Wt 254.0 lb

## 2017-03-12 DIAGNOSIS — E669 Obesity, unspecified: Secondary | ICD-10-CM

## 2017-03-12 DIAGNOSIS — I10 Essential (primary) hypertension: Secondary | ICD-10-CM

## 2017-03-12 DIAGNOSIS — IMO0001 Reserved for inherently not codable concepts without codable children: Secondary | ICD-10-CM

## 2017-03-12 DIAGNOSIS — Z6841 Body Mass Index (BMI) 40.0 and over, adult: Secondary | ICD-10-CM

## 2017-03-12 MED ORDER — HYDROCHLOROTHIAZIDE 12.5 MG PO TABS: 12.5000 mg | ORAL_TABLET | Freq: Every day | ORAL | 0 refills | Status: DC

## 2017-03-12 NOTE — Progress Notes (Signed)
Office: [phone]  /  Fax: [phone]   HPI:   Chief Complaint: OBESITY [name] is here to discuss her progress with her obesity treatment plan. She is on the Category 2 plan and is following her eating plan approximately 85 % of the time. She states she is exercising 0 minutes 0 times per week. [name] continues to do well with weight loss, even with some increased eating out. She has tried to make good choices and increased eating salads when eating out but she is using minimal dressing and is increasing protein. Her weight is 254 lb (115.2 kg) today and has had a weight loss of 3 pounds over a period of 3 weeks since her last visit. She has lost 26 lbs since starting treatment with [name].  Hypertension [name] is a 71 y.o. female with hypertension. Her blood pressure is improving nicely with diet, exercise and HCTZ. [name] denies chest pain, lightheadedness or shortness of breath on exertion. She is working weight loss to help control her blood pressure with the goal of decreasing her risk of heart attack and stroke. [name] blood pressure is currently controlled.   ALLERGIES: Allergies  Allergen Reactions  . Cymbalta [Duloxetine Hcl] Other (See Comments)    Stomach pain  . Gabapentin   . Lipitor [Atorvastatin] Other (See Comments)    Cramps  . Maxzide [Hydrochlorothiazide W-Triamterene] Other (See Comments)    Cramping   . Pravastatin Sodium Other (See Comments)    Headache   . Simvastatin Other (See Comments)    Memory loss  . Lodine [Etodolac] Itching    MEDICATIONS: Current Outpatient Prescriptions on File Prior to Visit  Medication Sig Dispense Refill  . amitriptyline (ELAVIL) 25 MG tablet Take 50 mg by mouth at bedtime.     [name] aspirin 81 MG tablet Take 81 mg by mouth daily.    [name] escitalopram (LEXAPRO) 10 MG tablet Take 10 mg by mouth daily.    . hydrochlorothiazide (HYDRODIURIL) 12.5 MG tablet Take 1 tablet (12.5 mg total) by mouth daily. 30 tablet 0   . irbesartan (AVAPRO) 300 MG tablet Take 300 mg by mouth at bedtime.   0  . ketoconazole (NIZORAL) 2 % cream Apply 1 application topically daily. 15 g 0  . niacin 500 MG CR capsule Take 500 mg by mouth 2 (two) times daily with a meal.    . Omega-3 Fatty Acids (FISH OIL) 1200 MG CAPS Take 1,200 mg by mouth 2 (two) times daily.    . ondansetron (ZOFRAN) 4 MG tablet Take 1 tablet (4 mg total) by mouth every 6 (six) hours. 12 tablet 0  . tamoxifen (NOLVADEX) 20 MG tablet TAKE 1 TABLET BY MOUTH  DAILY 90 tablet 0  . verapamil (VERELAN PM) 240 MG 24 hr capsule Take 240 mg by mouth daily.     . vitamin B-12 (CYANOCOBALAMIN) 1000 MCG tablet Take 1,000 mcg by mouth daily.    . Vitamin D, Cholecalciferol, 400 UNITS TABS Take 400 Units by mouth at bedtime.    . Vitamin D, Ergocalciferol, (DRISDOL) 50000 units CAPS capsule Take 1 capsule (50,000 Units total) by mouth every 7 (seven) days. 4 capsule 0   No current facility-administered medications on file prior to visit.     PAST MEDICAL HISTORY: Past Medical History:  Diagnosis Date  . Anemia   . Anxiety    takes Xanax daily  . Arthritis   . Back pain   . Blood transfusion   . Breast cancer (  [place]) 2013   left breast  . Bronchitis    hx of  . Chronic fatigue syndrome   . Depression    takes Lexapro daily  . Diabetes mellitus without complication ([place])    boderline  . Diverticulosis   . Edema    feet and legs  . Headache(784.0)   . History of colon polyps 1998   adenomatous  . Hyperlipidemia    takes Niacin daily  . Hypertension    takes Verapamil and Avapro daily  . Joint pain   . OSA (obstructive sleep apnea)   . Pneumonia 4/12   Albuterol daily as needed  . Radiation 10/29/11-11/24/11   left breast 6100 cGy  . Shortness of breath    with exertion  . Spinal stenosis   . Tinnitus     PAST SURGICAL HISTORY: Past Surgical History:  Procedure Laterality Date  . APPENDECTOMY  1978  . BACK SURGERY    . BELPHAROPTOSIS REPAIR      . BREAST LUMPECTOMY  08/2011   left  . BREAST LUMPECTOMY WITH NEEDLE LOCALIZATION Right 02/06/2013   Procedure: RIGHT BREAST LUMPECTOMY WITH NEEDLE LOCALIZATION;  Surgeon: Harl Bowie, MD;  Location: [facility];  Service: General;  Laterality: Right;  . COLONOSCOPY W/ POLYPECTOMY    . COLONOSCOPY WITH PROPOFOL N/A 10/27/2015   Procedure: COLONOSCOPY WITH PROPOFOL;  Surgeon: Mauri Pole, MD;  Location: [facility];  Service: Endoscopy;  Laterality: N/A;  . HARDWARE REMOVAL Left 04/20/2015   Procedure: HARDWARE REMOVAL LEFT LUMBAR FIVE SCREW;  Surgeon: Karie Chimera, MD;  Location: [facility];  Service: Neurosurgery;  Laterality: Left;  . JOINT REPLACEMENT Bilateral    bilateral knee  . POLYPECTOMY  1990's  . THYROID CYST EXCISION  1994  . TOTAL KNEE ARTHROPLASTY  2006   bilateral  . TRANSPHENOIDAL / TRANSNASAL HYPOPHYSECTOMY / RESECTION PITUITARY TUMOR  6/11    SOCIAL HISTORY: Social History  Substance Use Topics  . Smoking status: Never Smoker  . Smokeless tobacco: Never Used  . Alcohol use No    FAMILY HISTORY: Family History  Problem Relation Age of Onset  . Heart disease Father   . Clotting disorder Father   . Stroke Father   . Hypertension Father   . Heart Problems Father   . Stroke Mother   . Diabetes Mother   . Hypertension Mother   . Hyperlipidemia Mother   . Obesity Mother   . Cancer Sister 43       Breast Cancer  . Colon cancer Neg Hx   . Neuropathy Neg Hx     ROS: Review of Systems  Constitutional: Positive for weight loss.  Respiratory: Negative for shortness of breath (on exertion).   Cardiovascular: Negative for chest pain.  Neurological:       Negative lightheadedness    PHYSICAL EXAM: Blood pressure 115/70, pulse 82, temperature 98.1 F (36.7 C), temperature source Oral, height 5\' 6"  (1.676 m), weight 254 lb (115.2 kg), SpO2 100 %. Body mass index is 41 kg/m. Physical Exam  Constitutional: [name], place, and time. She  appears well-developed and well-nourished.  Cardiovascular: Normal rate.   Pulmonary/Chest: Effort normal.  Musculoskeletal: Normal range of motion.  Neurological: [name], place, and time.  Skin: Skin is warm and dry.  Psychiatric: She has a normal mood and affect. Her behavior is normal.  Vitals reviewed.   RECENT LABS AND TESTS: BMET    Component Value Date/Time  NA 141 12/14/2016 1146   NA 140 06/01/2016 1143   K 4.2 12/14/2016 1146   K 4.0 06/01/2016 1143   CL 100 12/14/2016 1146   CL 105 12/09/2012 1423   CO2 22 12/14/2016 1146   CO2 26 06/01/2016 1143   GLUCOSE 96 12/14/2016 1146   GLUCOSE 139 (H) 08/21/2016 2027   GLUCOSE 121 06/01/2016 1143   GLUCOSE 177 (H) 12/09/2012 1423   BUN 18 12/14/2016 1146   BUN 12.5 06/01/2016 1143   CREATININE 1.15 (H) 12/14/2016 1146   CREATININE 1.1 06/01/2016 1143   CALCIUM 10.7 (H) 12/14/2016 1146   CALCIUM 10.1 06/01/2016 1143   GFRNONAA 47 (L) 12/14/2016 1146   GFRAA 55 (L) 12/14/2016 1146   Lab Results  Component Value Date   HGBA1C 6.2 (H) 12/14/2016   HGBA1C 6.3 (H) 08/31/2016   HGBA1C (H) 11/11/2010    6.7 (NOTE)                                                                       According to the ADA Clinical Practice Recommendations for 2011, when HbA1c is used as a screening test:   >=6.5%   Diagnostic of Diabetes Mellitus           (if abnormal result  is confirmed)  5.7-6.4%   Increased risk of developing Diabetes Mellitus  References:Diagnosis and Classification of Diabetes Mellitus,Diabetes WERX,5400,86(PYPPJ 1):S62-S69 and Standards of Medical Care in         Diabetes - 2011,Diabetes Care,2011,34  (Suppl 1):S11-S61.   Lab Results  Component Value Date   INSULIN 20.9 12/14/2016   INSULIN 23.5 08/31/2016   CBC    Component Value Date/Time   WBC 7.4 12/14/2016 1146   WBC 14.5 (H) 08/21/2016 2027   RBC 4.25 12/14/2016 1146   RBC 4.46 08/21/2016 2027   HGB 11.0 (L) 12/14/2016 1146   HGB  11.1 (L) 06/01/2016 1143   HCT 33.9 (L) 12/14/2016 1146   HCT 34.9 06/01/2016 1143   PLT 267 08/21/2016 2027   PLT 248 06/01/2016 1143   MCV 80 12/14/2016 1146   MCV 81.2 06/01/2016 1143   MCH 25.9 (L) 12/14/2016 1146   MCH 26.5 08/21/2016 2027   MCHC 32.4 12/14/2016 1146   MCHC 33.1 08/21/2016 2027   RDW 16.4 (H) 12/14/2016 1146   RDW 15.8 (H) 06/01/2016 1143   LYMPHSABS 1.5 12/14/2016 1146   LYMPHSABS 1.8 06/01/2016 1143   MONOABS 0.6 06/01/2016 1143   EOSABS 0.1 12/14/2016 1146   BASOSABS 0.0 12/14/2016 1146   BASOSABS 0.0 06/01/2016 1143   Iron/TIBC/Ferritin/ %Sat    Component Value Date/Time   IRON 55 12/14/2016 1146   TIBC 329 12/14/2016 1146   FERRITIN 479 (H) 12/14/2016 1146   FERRITIN 362 (H) 04/15/2015 1058   IRONPCTSAT 17 12/14/2016 1146   Lipid Panel     Component Value Date/Time   CHOL 240 (H) 12/14/2016 1146   TRIG 184 (H) 12/14/2016 1146   HDL 43 12/14/2016 1146   CHOLHDL 6.4 11/11/2010 0555   VLDL 17 11/11/2010 0555   LDLCALC 160 (H) 12/14/2016 1146   Hepatic Function Panel     Component Value Date/Time   PROT 7.3 12/14/2016 1146   PROT 7.6 06/01/2016  1143   ALBUMIN 4.1 12/14/2016 1146   ALBUMIN 3.2 (L) 06/01/2016 1143   AST 10 12/14/2016 1146   AST 11 06/01/2016 1143   ALT 10 12/14/2016 1146   ALT 12 06/01/2016 1143   ALKPHOS 68 12/14/2016 1146   ALKPHOS 69 06/01/2016 1143   BILITOT 0.3 12/14/2016 1146   BILITOT 0.28 06/01/2016 1143     ASSESSMENT AND PLAN: Essential hypertension - Plan: hydrochlorothiazide (HYDRODIURIL) 12.5 MG tablet  Class 3 obesity with serious comorbidity and body mass index (BMI) of 40.0 to 44.9 in adult, unspecified obesity type (HCC)  PLAN:  Hypertension We discussed sodium restriction, working on healthy weight loss, and a regular exercise program as the means to achieve improved blood pressure control. [name] agreed with this plan and agreed to follow up as directed. We will continue to monitor her blood  pressure as well as her progress with the above lifestyle modifications. She agrees to continue HCTZ, we will refill for 1 month and she will watch for signs of hypotension as she continues her lifestyle modifications.  Obesity [name] is currently in the action stage of change. As such, her goal is to continue with weight loss efforts She has agreed to follow the Category 2 plan [name] has been instructed to work up to a goal of 150 minutes of combined cardio and strengthening exercise per week for weight loss and overall health benefits. We discussed the following Behavioral Modification Strategies today: travel eating strategies, celebration eating strategies, increasing lean protein intake and decreasing simple carbohydrates   [name] has agreed to follow up with our clinic in 2 to 3 weeks. She was informed of the importance of frequent follow up visits to maximize her success with intensive lifestyle modifications for her multiple health conditions.  I, Doreene Nest, am acting as transcriptionist for [name] Nip, MD  I have reviewed the above documentation for accuracy and completeness, and I agree with the above. -[name] Nip, MD  OBESITY BEHAVIORAL INTERVENTION VISIT  Today's visit was # 13 out of 22.  Starting weight: 280 lbs Starting date: 08/31/16 Today's weight : 254 lbs Today's date: 03/12/2017 Total lbs lost to date: 55 (Patients must lose 7 lbs in the first 6 months to continue with counseling)   ASK: We discussed the diagnosis of obesity with [name] today and [name] agreed to give [name] permission to discuss obesity behavioral modification therapy today.  ASSESS: [name] has the diagnosis of obesity and her BMI today is 41.1 [name] is in the action stage of change   ADVISE: [name] was educated on the multiple health risks of obesity as well as the benefit of weight loss to improve her health. She was advised of the need for long term treatment and the importance of  lifestyle modifications.  AGREE: Multiple dietary modification options and treatment options were discussed and  [name] agreed to follow the Category 2 plan We discussed the following Behavioral Modification Stratagies today: travel eating strategies, celebration eating strategies, increasing lean protein intake and decreasing simple carbohydrates

## 2017-03-15 DIAGNOSIS — G4733 Obstructive sleep apnea (adult) (pediatric): Secondary | ICD-10-CM | POA: Diagnosis not present

## 2017-03-20 ENCOUNTER — Other Ambulatory Visit: Payer: Self-pay | Admitting: Oncology

## 2017-03-23 DIAGNOSIS — I10 Essential (primary) hypertension: Secondary | ICD-10-CM | POA: Diagnosis not present

## 2017-03-23 DIAGNOSIS — D509 Iron deficiency anemia, unspecified: Secondary | ICD-10-CM | POA: Diagnosis not present

## 2017-03-23 DIAGNOSIS — E1142 Type 2 diabetes mellitus with diabetic polyneuropathy: Secondary | ICD-10-CM | POA: Diagnosis not present

## 2017-03-23 DIAGNOSIS — E7800 Pure hypercholesterolemia, unspecified: Secondary | ICD-10-CM | POA: Diagnosis not present

## 2017-03-23 DIAGNOSIS — Z23 Encounter for immunization: Secondary | ICD-10-CM | POA: Diagnosis not present

## 2017-03-29 ENCOUNTER — Ambulatory Visit (INDEPENDENT_AMBULATORY_CARE_PROVIDER_SITE_OTHER): Payer: Medicare Other | Admitting: Family Medicine

## 2017-03-29 VITALS — BP 126/73 | HR 68 | Temp 98.6°F | Ht 66.0 in | Wt 254.0 lb

## 2017-03-29 DIAGNOSIS — IMO0001 Reserved for inherently not codable concepts without codable children: Secondary | ICD-10-CM

## 2017-03-29 DIAGNOSIS — E559 Vitamin D deficiency, unspecified: Secondary | ICD-10-CM

## 2017-03-29 DIAGNOSIS — E669 Obesity, unspecified: Secondary | ICD-10-CM

## 2017-03-29 DIAGNOSIS — Z9189 Other specified personal risk factors, not elsewhere classified: Secondary | ICD-10-CM | POA: Diagnosis not present

## 2017-03-29 DIAGNOSIS — Z6841 Body Mass Index (BMI) 40.0 and over, adult: Secondary | ICD-10-CM | POA: Diagnosis not present

## 2017-03-29 MED ORDER — VITAMIN D (ERGOCALCIFEROL) 1.25 MG (50000 UNIT) PO CAPS: 50000.0000 [IU] | ORAL_CAPSULE | ORAL | 0 refills | Status: DC

## 2017-03-29 NOTE — Progress Notes (Signed)
Office: [phone]  /  Fax: [phone]   HPI:   Chief Complaint: OBESITY [name] is here to discuss her progress with her obesity treatment plan. She is on the Category 2 plan and is following her eating plan approximately 85 % of the time. She states she is exercising 0 minutes 0 times per week. [name] has done well with maintaining weight but has increased eating out. She is trying to make better choices but she is still not losing weight.  Her weight is 254 lb (115.2 kg) today and has not lost weight since her last visit. She has lost 26 lbs since starting treatment with [name].  Vitamin D deficiency [name] has a diagnosis of vitamin D deficiency. She is currently stable on Vit D and level is improving slowly but not yet at goal. [name] denies nausea, vomiting or muscle weakness.  ALLERGIES: Allergies  Allergen Reactions  . Cymbalta [Duloxetine Hcl] Other (See Comments)    Stomach pain  . Gabapentin   . Lipitor [Atorvastatin] Other (See Comments)    Cramps  . Maxzide [Hydrochlorothiazide W-Triamterene] Other (See Comments)    Cramping   . Pravastatin Sodium Other (See Comments)    Headache   . Simvastatin Other (See Comments)    Memory loss  . Lodine [Etodolac] Itching    MEDICATIONS: Current Outpatient Prescriptions on File Prior to Visit  Medication Sig Dispense Refill  . amitriptyline (ELAVIL) 25 MG tablet Take 50 mg by mouth at bedtime.     [name] aspirin 81 MG tablet Take 81 mg by mouth daily.    [name] escitalopram (LEXAPRO) 10 MG tablet Take 10 mg by mouth daily.    . hydrochlorothiazide (HYDRODIURIL) 12.5 MG tablet Take 1 tablet (12.5 mg total) by mouth daily. 30 tablet 0  . irbesartan (AVAPRO) 300 MG tablet Take 300 mg by mouth at bedtime.   0  . ketoconazole (NIZORAL) 2 % cream Apply 1 application topically daily. 15 g 0  . niacin 500 MG CR capsule Take 500 mg by mouth 2 (two) times daily with a meal.    . Omega-3 Fatty Acids (FISH OIL) 1200 MG CAPS Take 500 mg by mouth  daily.     . ondansetron (ZOFRAN) 4 MG tablet Take 1 tablet (4 mg total) by mouth every 6 (six) hours. 12 tablet 0  . tamoxifen (NOLVADEX) 20 MG tablet TAKE 1 TABLET BY MOUTH  DAILY 90 tablet 3  . verapamil (VERELAN PM) 240 MG 24 hr capsule Take 240 mg by mouth daily.     . vitamin B-12 (CYANOCOBALAMIN) 1000 MCG tablet Take 1,000 mcg by mouth daily.    . Vitamin D, Cholecalciferol, 400 UNITS TABS Take 400 Units by mouth at bedtime.     No current facility-administered medications on file prior to visit.     PAST MEDICAL HISTORY: Past Medical History:  Diagnosis Date  . Anemia   . Anxiety    takes Xanax daily  . Arthritis   . Back pain   . Blood transfusion   . Breast cancer ([place]) 2013   left breast  . Bronchitis    hx of  . Chronic fatigue syndrome   . Depression    takes Lexapro daily  . Diabetes mellitus without complication ([place])    boderline  . Diverticulosis   . Edema    feet and legs  . Headache(784.0)   . History of colon polyps 1998   adenomatous  . Hyperlipidemia    takes Niacin daily  .  Hypertension    takes Verapamil and Avapro daily  . Joint pain   . OSA (obstructive sleep apnea)   . Pneumonia 4/12   Albuterol daily as needed  . Radiation 10/29/11-11/24/11   left breast 6100 cGy  . Shortness of breath    with exertion  . Spinal stenosis   . Tinnitus     PAST SURGICAL HISTORY: Past Surgical History:  Procedure Laterality Date  . APPENDECTOMY  1978  . BACK SURGERY    . BELPHAROPTOSIS REPAIR    . BREAST LUMPECTOMY  08/2011   left  . BREAST LUMPECTOMY WITH NEEDLE LOCALIZATION Right 02/06/2013   Procedure: RIGHT BREAST LUMPECTOMY WITH NEEDLE LOCALIZATION;  Surgeon: Harl Bowie, MD;  Location: [facility];  Service: General;  Laterality: Right;  . COLONOSCOPY W/ POLYPECTOMY    . COLONOSCOPY WITH PROPOFOL N/A 10/27/2015   Procedure: COLONOSCOPY WITH PROPOFOL;  Surgeon: Mauri Pole, MD;  Location: [facility];  Service: Endoscopy;  Laterality:  N/A;  . HARDWARE REMOVAL Left 04/20/2015   Procedure: HARDWARE REMOVAL LEFT LUMBAR FIVE SCREW;  Surgeon: Karie Chimera, MD;  Location: [facility];  Service: Neurosurgery;  Laterality: Left;  . JOINT REPLACEMENT Bilateral    bilateral knee  . POLYPECTOMY  1990's  . THYROID CYST EXCISION  1994  . TOTAL KNEE ARTHROPLASTY  2006   bilateral  . TRANSPHENOIDAL / TRANSNASAL HYPOPHYSECTOMY / RESECTION PITUITARY TUMOR  6/11    SOCIAL HISTORY: Social History  Substance Use Topics  . Smoking status: Never Smoker  . Smokeless tobacco: Never Used  . Alcohol use No    FAMILY HISTORY: Family History  Problem Relation Age of Onset  . Heart disease Father   . Clotting disorder Father   . Stroke Father   . Hypertension Father   . Heart Problems Father   . Stroke Mother   . Diabetes Mother   . Hypertension Mother   . Hyperlipidemia Mother   . Obesity Mother   . Cancer Sister 64       Breast Cancer  . Colon cancer Neg Hx   . Neuropathy Neg Hx     ROS: Review of Systems  Constitutional: Negative for weight loss.  Gastrointestinal: Negative for nausea and vomiting.  Musculoskeletal:       Negative muscle weakness    PHYSICAL EXAM: Blood pressure 126/73, pulse 68, temperature 98.6 F (37 C), temperature source Oral, height 5\' 6"  (1.676 m), weight 254 lb (115.2 kg), SpO2 100 %. Body mass index is 41 kg/m. Physical Exam  Constitutional: [name], place, and time. She appears well-developed and well-nourished.  Cardiovascular: Normal rate.   Pulmonary/Chest: Effort normal.  Musculoskeletal: Normal range of motion.  Neurological: [name], place, and time.  Skin: Skin is warm and dry.  Psychiatric: She has a normal mood and affect. Her behavior is normal.  Vitals reviewed.   RECENT LABS AND TESTS: BMET    Component Value Date/Time   NA 141 12/14/2016 1146   NA 140 06/01/2016 1143   K 4.2 12/14/2016 1146   K 4.0 06/01/2016 1143   CL 100 12/14/2016  1146   CL 105 12/09/2012 1423   CO2 22 12/14/2016 1146   CO2 26 06/01/2016 1143   GLUCOSE 96 12/14/2016 1146   GLUCOSE 139 (H) 08/21/2016 2027   GLUCOSE 121 06/01/2016 1143   GLUCOSE 177 (H) 12/09/2012 1423   BUN 18 12/14/2016 1146   BUN 12.5 06/01/2016 1143   CREATININE  1.15 (H) 12/14/2016 1146   CREATININE 1.1 06/01/2016 1143   CALCIUM 10.7 (H) 12/14/2016 1146   CALCIUM 10.1 06/01/2016 1143   GFRNONAA 47 (L) 12/14/2016 1146   GFRAA 55 (L) 12/14/2016 1146   Lab Results  Component Value Date   HGBA1C 6.2 (H) 12/14/2016   HGBA1C 6.3 (H) 08/31/2016   HGBA1C (H) 11/11/2010    6.7 (NOTE)                                                                       According to the ADA Clinical Practice Recommendations for 2011, when HbA1c is used as a screening test:   >=6.5%   Diagnostic of Diabetes Mellitus           (if abnormal result  is confirmed)  5.7-6.4%   Increased risk of developing Diabetes Mellitus  References:Diagnosis and Classification of Diabetes Mellitus,Diabetes NWGN,5621,30(QMVHQ 1):S62-S69 and Standards of Medical Care in         Diabetes - 2011,Diabetes Care,2011,34  (Suppl 1):S11-S61.   Lab Results  Component Value Date   INSULIN 20.9 12/14/2016   INSULIN 23.5 08/31/2016   CBC    Component Value Date/Time   WBC 7.4 12/14/2016 1146   WBC 14.5 (H) 08/21/2016 2027   RBC 4.25 12/14/2016 1146   RBC 4.46 08/21/2016 2027   HGB 11.0 (L) 12/14/2016 1146   HGB 11.1 (L) 06/01/2016 1143   HCT 33.9 (L) 12/14/2016 1146   HCT 34.9 06/01/2016 1143   PLT 267 08/21/2016 2027   PLT 248 06/01/2016 1143   MCV 80 12/14/2016 1146   MCV 81.2 06/01/2016 1143   MCH 25.9 (L) 12/14/2016 1146   MCH 26.5 08/21/2016 2027   MCHC 32.4 12/14/2016 1146   MCHC 33.1 08/21/2016 2027   RDW 16.4 (H) 12/14/2016 1146   RDW 15.8 (H) 06/01/2016 1143   LYMPHSABS 1.5 12/14/2016 1146   LYMPHSABS 1.8 06/01/2016 1143   MONOABS 0.6 06/01/2016 1143   EOSABS 0.1 12/14/2016 1146   BASOSABS 0.0  12/14/2016 1146   BASOSABS 0.0 06/01/2016 1143   Iron/TIBC/Ferritin/ %Sat    Component Value Date/Time   IRON 55 12/14/2016 1146   TIBC 329 12/14/2016 1146   FERRITIN 479 (H) 12/14/2016 1146   FERRITIN 362 (H) 04/15/2015 1058   IRONPCTSAT 17 12/14/2016 1146   Lipid Panel     Component Value Date/Time   CHOL 240 (H) 12/14/2016 1146   TRIG 184 (H) 12/14/2016 1146   HDL 43 12/14/2016 1146   CHOLHDL 6.4 11/11/2010 0555   VLDL 17 11/11/2010 0555   LDLCALC 160 (H) 12/14/2016 1146   Hepatic Function Panel     Component Value Date/Time   PROT 7.3 12/14/2016 1146   PROT 7.6 06/01/2016 1143   ALBUMIN 4.1 12/14/2016 1146   ALBUMIN 3.2 (L) 06/01/2016 1143   AST 10 12/14/2016 1146   AST 11 06/01/2016 1143   ALT 10 12/14/2016 1146   ALT 12 06/01/2016 1143   ALKPHOS 68 12/14/2016 1146   ALKPHOS 69 06/01/2016 1143   BILITOT 0.3 12/14/2016 1146   BILITOT 0.28 06/01/2016 1143     ASSESSMENT AND PLAN: Vitamin D deficiency - Plan: Vitamin D, Ergocalciferol, (DRISDOL) 50000 units CAPS capsule  Class 3 obesity with  serious comorbidity and body mass index (BMI) of 40.0 to 44.9 in adult, unspecified obesity type ([place])  PLAN:  Vitamin D Deficiency [name] was informed that low vitamin D levels contributes to fatigue and are associated with obesity, breast, and colon cancer. She agrees to continue taking prescription Vit D @50 ,000 IU every week and will follow up for routine testing of vitamin D, at least 2-3 times per year. She was informed of the risk of over-replacement of vitamin D and agrees to not increase her dose unless he discusses this with [name] first. We will refill Vit D for 1 month and we will recheck labs in 1 month. [name] agrees to follow up with our clinic in 2 to 3 weeks.  At risk for osteopenia and osteoporosis [name] is at risk for osteopenia and osteoporsis due to her vitamin D deficiency. She was encouraged to take her vitamin D and follow her higher calcium diet and increase  strengthening exercise to help strengthen her bones and decrease her risk of osteopenia and osteoporosis.   Obesity [name] is currently in the action stage of change. As such, her goal is to continue with weight loss efforts She has agrees to change to the [place] has been instructed to work up to a goal of 150 minutes of combined cardio and strengthening exercise per week for weight loss and overall health benefits. We discussed the following Behavioral Modification Strategies today: increasing lean protein intake, decreasing simple carbohydrates, decrease eating out, meal planning & cooking strategies, dealing with family or coworker sabotage, and planning for success.   [name] has agreed to follow up with our clinic in 2 to 3 weeks. She was informed of the importance of frequent follow up visits to maximize her success with intensive lifestyle modifications for her multiple health conditions.  I, [name], am acting as transcriptionist for Dennard Nip, MD  I have reviewed the above documentation for accuracy and completeness, and I agree with the above. -Dennard Nip, MD    OBESITY BEHAVIORAL INTERVENTION VISIT  Today's visit was # 14 out of 22.  Starting weight: 280 lbs Starting date: 08/31/16 Today's weight : 254 lbs  Today's date: 03/29/17 Total lbs lost to date: 56 (Patients must lose 7 lbs in the first 6 months to continue with counseling)   ASK: We discussed the diagnosis of obesity with [name] today and [name] agreed to give [name] permission to discuss obesity behavioral modification therapy today.  ASSESS: [name] has the diagnosis of obesity and her BMI today is 70 [name] is in the action stage of change   ADVISE: [name] was educated on the multiple health risks of obesity as well as the benefit of weight loss to improve her health. She was advised of the need for long term treatment and the importance of lifestyle modifications.  AGREE: Multiple  dietary modification options and treatment options were discussed and  [name] agreed to follow the Low Carb plan We discussed the following Behavioral Modification Strategies today: increasing lean protein intake, decreasing simple carbohydrates, decrease eating out, meal planning & cooking strategies, dealing with family or coworker sabotage, and planning for success.

## 2017-04-19 ENCOUNTER — Ambulatory Visit (INDEPENDENT_AMBULATORY_CARE_PROVIDER_SITE_OTHER): Payer: Medicare Other | Admitting: Family Medicine

## 2017-04-19 VITALS — BP 115/71 | HR 91 | Temp 98.2°F | Ht 66.0 in | Wt 254.0 lb

## 2017-04-19 DIAGNOSIS — E669 Obesity, unspecified: Secondary | ICD-10-CM | POA: Diagnosis not present

## 2017-04-19 DIAGNOSIS — I10 Essential (primary) hypertension: Secondary | ICD-10-CM | POA: Diagnosis not present

## 2017-04-19 DIAGNOSIS — IMO0001 Reserved for inherently not codable concepts without codable children: Secondary | ICD-10-CM

## 2017-04-19 DIAGNOSIS — Z6841 Body Mass Index (BMI) 40.0 and over, adult: Secondary | ICD-10-CM

## 2017-04-23 NOTE — Progress Notes (Signed)
Office: [phone]  /  Fax: [phone]   HPI:   Chief Complaint: OBESITY [name] [name] here to discuss her progress with her obesity treatment plan. She [name] on the lower carbohydrate, vegetable and lean protein rich diet plan and [name] following her eating plan approximately 85 % of the time. She states she [name] exercising 0 minutes 0 times per week. [name] her weight. She has done really well with planning ahead and following the plan. [name] feels very motivated for the weight loss. Her weight [name] 254 lb (115.2 kg) today and has maintained weight over a period of 3 weeks since her last visit. She has lost 26 lbs since starting treatment with [name].  Hypertension [name] [name] a 71 y.o. female with hypertension.  [name] denies chest pain or shortness of breath on exertion. She [name] working weight loss to help control her blood pressure with the goal of decreasing her risk of heart attack and stroke. [name] blood pressure [name] currently controlled.   ALLERGIES: Allergies  Allergen Reactions  . Cymbalta [Duloxetine Hcl] Other (See Comments)    Stomach pain  . Gabapentin   . Lipitor [Atorvastatin] Other (See Comments)    Cramps  . Maxzide [Hydrochlorothiazide W-Triamterene] Other (See Comments)    Cramping   . Pravastatin Sodium Other (See Comments)    Headache   . Simvastatin Other (See Comments)    Memory loss  . Lodine [Etodolac] Itching    MEDICATIONS: Current Outpatient Prescriptions on File Prior to Visit  Medication Sig Dispense Refill  . amitriptyline (ELAVIL) 25 MG tablet Take 50 mg by mouth at bedtime.     [name] aspirin 81 MG tablet Take 81 mg by mouth daily.    [name] escitalopram (LEXAPRO) 10 MG tablet Take 10 mg by mouth daily.    . hydrochlorothiazide (HYDRODIURIL) 12.5 MG tablet Take 1 tablet (12.5 mg total) by mouth daily. 30 tablet 0  . irbesartan (AVAPRO) 300 MG tablet Take 300 mg by mouth at bedtime.   0  . ketoconazole (NIZORAL) 2 % cream Apply 1  application topically daily. 15 g 0  . niacin 500 MG CR capsule Take 500 mg by mouth 2 (two) times daily with a meal.    . Omega-3 Fatty Acids (FISH OIL) 1200 MG CAPS Take 500 mg by mouth daily.     . ondansetron (ZOFRAN) 4 MG tablet Take 1 tablet (4 mg total) by mouth every 6 (six) hours. 12 tablet 0  . tamoxifen (NOLVADEX) 20 MG tablet TAKE 1 TABLET BY MOUTH  DAILY 90 tablet 3  . verapamil (VERELAN PM) 240 MG 24 hr capsule Take 240 mg by mouth daily.     . vitamin B-12 (CYANOCOBALAMIN) 1000 MCG tablet Take 1,000 mcg by mouth daily.    . Vitamin D, Cholecalciferol, 400 UNITS TABS Take 400 Units by mouth at bedtime.    . Vitamin D, Ergocalciferol, (DRISDOL) 50000 units CAPS capsule Take 1 capsule (50,000 Units total) by mouth every 7 (seven) days. 4 capsule 0   No current facility-administered medications on file prior to visit.     PAST MEDICAL HISTORY: Past Medical History:  Diagnosis Date  . Anemia   . Anxiety    takes Xanax daily  . Arthritis   . Back pain   . Blood transfusion   . Breast cancer ([place]) 2013   left breast  . Bronchitis    hx of  . Chronic fatigue syndrome   . Depression    takes  Lexapro daily  . Diabetes mellitus without complication ([place])    boderline  . Diverticulosis   . Edema    feet and legs  . Headache(784.0)   . History of colon polyps 1998   adenomatous  . Hyperlipidemia    takes Niacin daily  . Hypertension    takes Verapamil and Avapro daily  . Joint pain   . OSA (obstructive sleep apnea)   . Pneumonia 4/12   Albuterol daily as needed  . Radiation 10/29/11-11/24/11   left breast 6100 cGy  . Shortness of breath    with exertion  . Spinal stenosis   . Tinnitus     PAST SURGICAL HISTORY: Past Surgical History:  Procedure Laterality Date  . APPENDECTOMY  1978  . BACK SURGERY    . BELPHAROPTOSIS REPAIR    . BREAST LUMPECTOMY  08/2011   left  . BREAST LUMPECTOMY WITH NEEDLE LOCALIZATION Right 02/06/2013   Procedure: RIGHT BREAST  LUMPECTOMY WITH NEEDLE LOCALIZATION;  Surgeon: Harl Bowie, MD;  Location: [facility];  Service: General;  Laterality: Right;  . COLONOSCOPY W/ POLYPECTOMY    . COLONOSCOPY WITH PROPOFOL N/A 10/27/2015   Procedure: COLONOSCOPY WITH PROPOFOL;  Surgeon: Mauri Pole, MD;  Location: [facility];  Service: Endoscopy;  Laterality: N/A;  . HARDWARE REMOVAL Left 04/20/2015   Procedure: HARDWARE REMOVAL LEFT LUMBAR FIVE SCREW;  Surgeon: Karie Chimera, MD;  Location: [facility];  Service: Neurosurgery;  Laterality: Left;  . JOINT REPLACEMENT Bilateral    bilateral knee  . POLYPECTOMY  1990's  . THYROID CYST EXCISION  1994  . TOTAL KNEE ARTHROPLASTY  2006   bilateral  . TRANSPHENOIDAL / TRANSNASAL HYPOPHYSECTOMY / RESECTION PITUITARY TUMOR  6/11    SOCIAL HISTORY: Social History  Substance Use Topics  . Smoking status: Never Smoker  . Smokeless tobacco: Never Used  . Alcohol use No    FAMILY HISTORY: Family History  Problem Relation Age of Onset  . Heart disease Father   . Clotting disorder Father   . Stroke Father   . Hypertension Father   . Heart Problems Father   . Stroke Mother   . Diabetes Mother   . Hypertension Mother   . Hyperlipidemia Mother   . Obesity Mother   . Cancer Sister 70       Breast Cancer  . Colon cancer Neg Hx   . Neuropathy Neg Hx     ROS: Review of Systems  Constitutional: Negative for weight loss.  Respiratory: Negative for shortness of breath (on exertion).   Cardiovascular: Negative for chest pain.    PHYSICAL EXAM: Blood pressure 115/71, pulse 91, temperature 98.2 F (36.8 C), temperature source Oral, height 5\' 6"  (1.676 m), weight 254 lb (115.2 kg), SpO2 99 %. Body mass index [name] 41 kg/m. Physical Exam  Constitutional: She [name] oriented to person, place, and time. She appears well-developed and well-nourished.  Cardiovascular: Normal rate.   Pulmonary/Chest: Effort normal.  Musculoskeletal: Normal range of motion.  Neurological: She [name]  oriented to person, place, and time.  Skin: Skin [name] warm and dry.  Psychiatric: She has a normal mood and affect. Her behavior [name] normal.  Vitals reviewed.   RECENT LABS AND TESTS: BMET    Component Value Date/Time   NA 141 12/14/2016 1146   NA 140 06/01/2016 1143   K 4.2 12/14/2016 1146   K 4.0 06/01/2016 1143   CL 100 12/14/2016 1146   CL 105 12/09/2012 1423   CO2 22  12/14/2016 1146   CO2 26 06/01/2016 1143   GLUCOSE 96 12/14/2016 1146   GLUCOSE 139 (H) 08/21/2016 2027   GLUCOSE 121 06/01/2016 1143   GLUCOSE 177 (H) 12/09/2012 1423   BUN 18 12/14/2016 1146   BUN 12.5 06/01/2016 1143   CREATININE 1.15 (H) 12/14/2016 1146   CREATININE 1.1 06/01/2016 1143   CALCIUM 10.7 (H) 12/14/2016 1146   CALCIUM 10.1 06/01/2016 1143   GFRNONAA 47 (L) 12/14/2016 1146   GFRAA 55 (L) 12/14/2016 1146   Lab Results  Component Value Date   HGBA1C 6.2 (H) 12/14/2016   HGBA1C 6.3 (H) 08/31/2016   HGBA1C (H) 11/11/2010    6.7 (NOTE)                                                                       According to the ADA Clinical Practice Recommendations for 2011, when HbA1c [name] used as a screening test:   >=6.5%   Diagnostic of Diabetes Mellitus           (if abnormal result  [name] confirmed)  5.7-6.4%   Increased risk of developing Diabetes Mellitus  References:Diagnosis and Classification of Diabetes Mellitus,Diabetes BMWU,1324,40(NUUVO 1):S62-S69 and Standards of Medical Care in         Diabetes - 2011,Diabetes Care,2011,34  (Suppl 1):S11-S61.   Lab Results  Component Value Date   INSULIN 20.9 12/14/2016   INSULIN 23.5 08/31/2016   CBC    Component Value Date/Time   WBC 7.4 12/14/2016 1146   WBC 14.5 (H) 08/21/2016 2027   RBC 4.25 12/14/2016 1146   RBC 4.46 08/21/2016 2027   HGB 11.0 (L) 12/14/2016 1146   HGB 11.1 (L) 06/01/2016 1143   HCT 33.9 (L) 12/14/2016 1146   HCT 34.9 06/01/2016 1143   PLT 267 08/21/2016 2027   PLT 248 06/01/2016 1143   MCV 80 12/14/2016 1146   MCV 81.2  06/01/2016 1143   MCH 25.9 (L) 12/14/2016 1146   MCH 26.5 08/21/2016 2027   MCHC 32.4 12/14/2016 1146   MCHC 33.1 08/21/2016 2027   RDW 16.4 (H) 12/14/2016 1146   RDW 15.8 (H) 06/01/2016 1143   LYMPHSABS 1.5 12/14/2016 1146   LYMPHSABS 1.8 06/01/2016 1143   MONOABS 0.6 06/01/2016 1143   EOSABS 0.1 12/14/2016 1146   BASOSABS 0.0 12/14/2016 1146   BASOSABS 0.0 06/01/2016 1143   Iron/TIBC/Ferritin/ %Sat    Component Value Date/Time   IRON 55 12/14/2016 1146   TIBC 329 12/14/2016 1146   FERRITIN 479 (H) 12/14/2016 1146   FERRITIN 362 (H) 04/15/2015 1058   IRONPCTSAT 17 12/14/2016 1146   Lipid Panel     Component Value Date/Time   CHOL 240 (H) 12/14/2016 1146   TRIG 184 (H) 12/14/2016 1146   HDL 43 12/14/2016 1146   CHOLHDL 6.4 11/11/2010 0555   VLDL 17 11/11/2010 0555   LDLCALC 160 (H) 12/14/2016 1146   Hepatic Function Panel     Component Value Date/Time   PROT 7.3 12/14/2016 1146   PROT 7.6 06/01/2016 1143   ALBUMIN 4.1 12/14/2016 1146   ALBUMIN 3.2 (L) 06/01/2016 1143   AST 10 12/14/2016 1146   AST 11 06/01/2016 1143   ALT 10 12/14/2016 1146   ALT 12 06/01/2016 1143  ALKPHOS 68 12/14/2016 1146   ALKPHOS 69 06/01/2016 1143   BILITOT 0.3 12/14/2016 1146   BILITOT 0.28 06/01/2016 1143     ASSESSMENT AND PLAN: Essential hypertension  Class 3 obesity with serious comorbidity and body mass index (BMI) of 40.0 to 44.9 in adult, unspecified obesity type ([facility])  PLAN:  Hypertension We discussed sodium restriction, working on healthy weight loss, and a regular exercise program as the means to achieve improved blood pressure control. [name] [name] with this plan and [name] to follow up as directed. We will continue to monitor her blood pressure as well as her progress with the above lifestyle modifications. She will continue her medications as prescribed and will watch for signs of hypotension as she continues her lifestyle modifications.  We spent > than 50% of the 15  minute visit on the counseling as documented in the note.  Obesity [name] currently in the action stage of change. As such, her goal [name] to continue with weight loss efforts She has [name] to follow the Category 2 plan [name] has been instructed to work up to a goal of 150 minutes of combined cardio and strengthening exercise per week for weight loss and overall health benefits. We discussed the following Behavioral Modification Strategies today: increasing lean protein intake and work on meal planning and easy cooking plans  [name] to follow up with our clinic in 2 to 3 weeks. She was informed of the importance of frequent follow up visits to maximize her success with intensive lifestyle modifications for her multiple health conditions.  I, Doreene Nest, am acting as transcriptionist for Lacy Duverney, PA-C  I have reviewed the above documentation for accuracy and completeness, and I agree with the above. -Lacy Duverney, PA-C  I have reviewed the above note and agree with the plan. -Dennard Nip, MD  OBESITY BEHAVIORAL INTERVENTION VISIT  Today's visit was # 15 out of 20.  Starting weight: 280 lbs Starting date: 08/31/16 Today's weight : 254 lbs Today's date: 04/19/2017 Total lbs lost to date: 2 (Patients must lose 7 lbs in the first 6 months to continue with counseling)   ASK: We discussed the diagnosis of obesity with [name] today and [name] [name] to give [name] permission to discuss obesity behavioral modification therapy today.  ASSESS: [name] has the diagnosis of obesity and her BMI today [name] 41.02 [name] [name] in the action stage of change   ADVISE: [name] was educated on the multiple health risks of obesity as well as the benefit of weight loss to improve her health. She was advised of the need for long term treatment and the importance of lifestyle modifications.  AGREE: Multiple dietary modification options and treatment options were discussed and  [name] [name]  to follow the Category 2 plan We discussed the following Behavioral Modification Strategies today: increasing lean protein intake and work on meal planning and easy cooking plans

## 2017-05-10 ENCOUNTER — Ambulatory Visit (INDEPENDENT_AMBULATORY_CARE_PROVIDER_SITE_OTHER): Payer: Medicare Other | Admitting: Physician Assistant

## 2017-05-10 VITALS — BP 120/73 | HR 71 | Temp 98.1°F | Ht 66.0 in | Wt 252.0 lb

## 2017-05-10 DIAGNOSIS — Z6841 Body Mass Index (BMI) 40.0 and over, adult: Secondary | ICD-10-CM

## 2017-05-10 DIAGNOSIS — I10 Essential (primary) hypertension: Secondary | ICD-10-CM

## 2017-05-10 DIAGNOSIS — D508 Other iron deficiency anemias: Secondary | ICD-10-CM | POA: Diagnosis not present

## 2017-05-10 DIAGNOSIS — R7303 Prediabetes: Secondary | ICD-10-CM | POA: Diagnosis not present

## 2017-05-10 DIAGNOSIS — E7849 Other hyperlipidemia: Secondary | ICD-10-CM | POA: Diagnosis not present

## 2017-05-10 DIAGNOSIS — R7989 Other specified abnormal findings of blood chemistry: Secondary | ICD-10-CM | POA: Diagnosis not present

## 2017-05-10 DIAGNOSIS — E559 Vitamin D deficiency, unspecified: Secondary | ICD-10-CM

## 2017-05-10 MED ORDER — VITAMIN D (ERGOCALCIFEROL) 1.25 MG (50000 UNIT) PO CAPS: 50000.0000 [IU] | ORAL_CAPSULE | ORAL | 0 refills | Status: DC

## 2017-05-10 MED ORDER — HYDROCHLOROTHIAZIDE 12.5 MG PO TABS: 12.5000 mg | ORAL_TABLET | Freq: Every day | ORAL | 0 refills | Status: DC

## 2017-05-10 NOTE — Progress Notes (Signed)
Office: [phone]  /  Fax: [phone]   HPI:   Chief Complaint: OBESITY [name] is here to discuss her progress with her obesity treatment plan. She is on the Category 2 plan and is following her eating plan approximately 85 % of the time. She states she is exercising 0 minutes 0 times per week. Clorine continues to do well with weight loss. She plans her meals well and states her hunger is well controlled. She would like more variety at dinner.  Her weight is 252 lb (114.3 kg) today and has had a weight loss of 2 pounds over a period of 3 weeks since her last visit. She has lost 28 lbs since starting treatment with [name].  Hypertension [name] is a 71 y.o. female with hypertension. [name] denies chest pain or shortness of breath. She is working weight loss to help control her blood pressure with the goal of decreasing her risk of heart attack and stroke. [name] blood pressure is currently controlled.  Vitamin D deficiency [name] has a diagnosis of vitamin D deficiency. She is currently taking prescription Vit D and denies nausea, vomiting or muscle weakness.  Hyperlipidemia [name] has hyperlipidemia and has been trying to improve her cholesterol levels with intensive lifestyle modification including a low saturated fat diet, exercise and weight loss. [name] is currently on Niacin. She denies any chest pain, claudication or myalgias.  Iron Deficiency Anemia [name] has a history of Iron Deficiency Anemia. She is not taking iron pills and she admits fatigue.  Pre-Diabetes [name] has a diagnosis of pre-diabetes based on her elevated Hgb A1c and was informed this puts her at greater risk of developing diabetes. She is not taking metformin currently and continues to work on diet and exercise to decrease risk of diabetes. She denies nausea or hypoglycemia.  ALLERGIES: Allergies  Allergen Reactions  . Cymbalta [Duloxetine Hcl] Other (See Comments)    Stomach pain  . Gabapentin    . Lipitor [Atorvastatin] Other (See Comments)    Cramps  . Maxzide [Hydrochlorothiazide W-Triamterene] Other (See Comments)    Cramping   . Pravastatin Sodium Other (See Comments)    Headache   . Simvastatin Other (See Comments)    Memory loss  . Lodine [Etodolac] Itching    MEDICATIONS: Current Outpatient Prescriptions on File Prior to Visit  Medication Sig Dispense Refill  . amitriptyline (ELAVIL) 25 MG tablet Take 50 mg by mouth at bedtime.     [name] aspirin 81 MG tablet Take 81 mg by mouth daily.    [name] escitalopram (LEXAPRO) 10 MG tablet Take 10 mg by mouth daily.    . irbesartan (AVAPRO) 300 MG tablet Take 300 mg by mouth at bedtime.   0  . ketoconazole (NIZORAL) 2 % cream Apply 1 application topically daily. 15 g 0  . niacin 500 MG CR capsule Take 500 mg by mouth 2 (two) times daily with a meal.    . Omega-3 Fatty Acids (FISH OIL) 1200 MG CAPS Take 500 mg by mouth daily.     . tamoxifen (NOLVADEX) 20 MG tablet TAKE 1 TABLET BY MOUTH  DAILY 90 tablet 3  . verapamil (VERELAN PM) 240 MG 24 hr capsule Take 240 mg by mouth daily.     . vitamin B-12 (CYANOCOBALAMIN) 1000 MCG tablet Take 1,000 mcg by mouth daily.    . Vitamin D, Cholecalciferol, 400 UNITS TABS Take 400 Units by mouth at bedtime.     No current facility-administered medications on file prior  to visit.     PAST MEDICAL HISTORY: Past Medical History:  Diagnosis Date  . Anemia   . Anxiety    takes Xanax daily  . Arthritis   . Back pain   . Blood transfusion   . Breast cancer ([facility]) 2013   left breast  . Bronchitis    hx of  . Chronic fatigue syndrome   . Depression    takes Lexapro daily  . Diabetes mellitus without complication ([place])    boderline  . Diverticulosis   . Edema    feet and legs  . Headache(784.0)   . History of colon polyps 1998   adenomatous  . Hyperlipidemia    takes Niacin daily  . Hypertension    takes Verapamil and Avapro daily  . Joint pain   . OSA (obstructive sleep apnea)   .  Pneumonia 4/12   Albuterol daily as needed  . Radiation 10/29/11-11/24/11   left breast 6100 cGy  . Shortness of breath    with exertion  . Spinal stenosis   . Tinnitus     PAST SURGICAL HISTORY: Past Surgical History:  Procedure Laterality Date  . APPENDECTOMY  1978  . BACK SURGERY    . BELPHAROPTOSIS REPAIR    . BREAST LUMPECTOMY  08/2011   left  . BREAST LUMPECTOMY WITH NEEDLE LOCALIZATION Right 02/06/2013   Procedure: RIGHT BREAST LUMPECTOMY WITH NEEDLE LOCALIZATION;  Surgeon: Harl Bowie, MD;  Location: [facility];  Service: General;  Laterality: Right;  . COLONOSCOPY W/ POLYPECTOMY    . COLONOSCOPY WITH PROPOFOL N/A 10/27/2015   Procedure: COLONOSCOPY WITH PROPOFOL;  Surgeon: Mauri Pole, MD;  Location: [facility];  Service: Endoscopy;  Laterality: N/A;  . HARDWARE REMOVAL Left 04/20/2015   Procedure: HARDWARE REMOVAL LEFT LUMBAR FIVE SCREW;  Surgeon: Karie Chimera, MD;  Location: [facility];  Service: Neurosurgery;  Laterality: Left;  . JOINT REPLACEMENT Bilateral    bilateral knee  . POLYPECTOMY  1990's  . THYROID CYST EXCISION  1994  . TOTAL KNEE ARTHROPLASTY  2006   bilateral  . TRANSPHENOIDAL / TRANSNASAL HYPOPHYSECTOMY / RESECTION PITUITARY TUMOR  6/11    SOCIAL HISTORY: Social History  Substance Use Topics  . Smoking status: Never Smoker  . Smokeless tobacco: Never Used  . Alcohol use No    FAMILY HISTORY: Family History  Problem Relation Age of Onset  . Heart disease Father   . Clotting disorder Father   . Stroke Father   . Hypertension Father   . Heart Problems Father   . Stroke Mother   . Diabetes Mother   . Hypertension Mother   . Hyperlipidemia Mother   . Obesity Mother   . Cancer Sister 35       Breast Cancer  . Colon cancer Neg Hx   . Neuropathy Neg Hx     ROS: Review of Systems  Constitutional: Positive for malaise/fatigue and weight loss.  Respiratory: Negative for shortness of breath.   Cardiovascular: Negative for chest pain and  claudication.  Gastrointestinal: Negative for nausea and vomiting.  Musculoskeletal: Negative for myalgias.  Endo/Heme/Allergies:       Negative muscle weakness Negative hypoglycemia Negative polyphagia    PHYSICAL EXAM: Blood pressure 120/73, pulse 71, temperature 98.1 F (36.7 C), temperature source Oral, height 5\' 6"  (1.676 m), weight 252 lb (114.3 kg), SpO2 96 %. Body mass index is 40.67 kg/m. Physical Exam  Constitutional: [name], place, and time. She appears well-developed and  well-nourished.  Cardiovascular: Normal rate.   Pulmonary/Chest: Effort normal.  Musculoskeletal: Normal range of motion.  Neurological: [name], place, and time.  Skin: Skin is warm and dry.  Psychiatric: She has a normal mood and affect. Her behavior is normal.  Vitals reviewed.   RECENT LABS AND TESTS: BMET    Component Value Date/Time   NA 141 12/14/2016 1146   NA 140 06/01/2016 1143   K 4.2 12/14/2016 1146   K 4.0 06/01/2016 1143   CL 100 12/14/2016 1146   CL 105 12/09/2012 1423   CO2 22 12/14/2016 1146   CO2 26 06/01/2016 1143   GLUCOSE 96 12/14/2016 1146   GLUCOSE 139 (H) 08/21/2016 2027   GLUCOSE 121 06/01/2016 1143   GLUCOSE 177 (H) 12/09/2012 1423   BUN 18 12/14/2016 1146   BUN 12.5 06/01/2016 1143   CREATININE 1.15 (H) 12/14/2016 1146   CREATININE 1.1 06/01/2016 1143   CALCIUM 10.7 (H) 12/14/2016 1146   CALCIUM 10.1 06/01/2016 1143   GFRNONAA 47 (L) 12/14/2016 1146   GFRAA 55 (L) 12/14/2016 1146   Lab Results  Component Value Date   HGBA1C 6.2 (H) 12/14/2016   HGBA1C 6.3 (H) 08/31/2016   HGBA1C (H) 11/11/2010    6.7 (NOTE)                                                                       According to the ADA Clinical Practice Recommendations for 2011, when HbA1c is used as a screening test:   >=6.5%   Diagnostic of Diabetes Mellitus           (if abnormal result  is confirmed)  5.7-6.4%   Increased risk of developing Diabetes  [place]  References:Diagnosis and Classification of Diabetes Mellitus,Diabetes GQQP,6195,09(TOIZT 1):S62-S69 and Standards of Medical Care in         Diabetes - 2011,Diabetes Care,2011,34  (Suppl 1):S11-S61.   Lab Results  Component Value Date   INSULIN 20.9 12/14/2016   INSULIN 23.5 08/31/2016   CBC    Component Value Date/Time   WBC 7.4 12/14/2016 1146   WBC 14.5 (H) 08/21/2016 2027   RBC 4.25 12/14/2016 1146   RBC 4.46 08/21/2016 2027   HGB 11.0 (L) 12/14/2016 1146   HGB 11.1 (L) 06/01/2016 1143   HCT 33.9 (L) 12/14/2016 1146   HCT 34.9 06/01/2016 1143   PLT 267 08/21/2016 2027   PLT 248 06/01/2016 1143   MCV 80 12/14/2016 1146   MCV 81.2 06/01/2016 1143   MCH 25.9 (L) 12/14/2016 1146   MCH 26.5 08/21/2016 2027   MCHC 32.4 12/14/2016 1146   MCHC 33.1 08/21/2016 2027   RDW 16.4 (H) 12/14/2016 1146   RDW 15.8 (H) 06/01/2016 1143   LYMPHSABS 1.5 12/14/2016 1146   LYMPHSABS 1.8 06/01/2016 1143   MONOABS 0.6 06/01/2016 1143   EOSABS 0.1 12/14/2016 1146   BASOSABS 0.0 12/14/2016 1146   BASOSABS 0.0 06/01/2016 1143   Iron/TIBC/Ferritin/ %Sat    Component Value Date/Time   IRON 55 12/14/2016 1146   TIBC 329 12/14/2016 1146   FERRITIN 479 (H) 12/14/2016 1146   FERRITIN 362 (H) 04/15/2015 1058   IRONPCTSAT 17 12/14/2016 1146   Lipid Panel     Component  Value Date/Time   CHOL 240 (H) 12/14/2016 1146   TRIG 184 (H) 12/14/2016 1146   HDL 43 12/14/2016 1146   CHOLHDL 6.4 11/11/2010 0555   VLDL 17 11/11/2010 0555   LDLCALC 160 (H) 12/14/2016 1146   Hepatic Function Panel     Component Value Date/Time   PROT 7.3 12/14/2016 1146   PROT 7.6 06/01/2016 1143   ALBUMIN 4.1 12/14/2016 1146   ALBUMIN 3.2 (L) 06/01/2016 1143   AST 10 12/14/2016 1146   AST 11 06/01/2016 1143   ALT 10 12/14/2016 1146   ALT 12 06/01/2016 1143   ALKPHOS 68 12/14/2016 1146   ALKPHOS 69 06/01/2016 1143   BILITOT 0.3 12/14/2016 1146   BILITOT 0.28 06/01/2016 1143     ASSESSMENT AND  PLAN: Essential hypertension - Plan: Comprehensive metabolic panel, hydrochlorothiazide (HYDRODIURIL) 12.5 MG tablet  Vitamin D deficiency - Plan: VITAMIN D 25 Hydroxy (Vit-D Deficiency, Fractures), Vitamin D, Ergocalciferol, (DRISDOL) 50000 units CAPS capsule  Other hyperlipidemia - Plan: Lipid Panel With LDL/HDL Ratio  Other iron deficiency anemia - Plan: CBC With Differential  Prediabetes - Plan: Comprehensive metabolic panel, Hemoglobin A1c, Insulin, random  High serum vitamin B12 - Plan: Vitamin B12  Class 3 severe obesity with serious comorbidity and body mass index (BMI) of 40.0 to 44.9 in adult, unspecified obesity type (HCC)  PLAN:  Hypertension We discussed sodium restriction, working on healthy weight loss, and a regular exercise program as the means to achieve improved blood pressure control. [name] agreed with this plan and agreed to follow up as directed. She agrees to continue taking hydochlorothiazide 12.5 mg #30 and we will refill for one month. We will continue to monitor her blood pressure as well as her progress with the above lifestyle modifications. She will continue her medications as prescribed and will watch for signs of hypotension as she continues her lifestyle modifications. We will recheck labs and she will follow up with our clinic in 4 weeks.  Vitamin D Deficiency [name] was informed that low vitamin D levels contributes to fatigue and are associated with obesity, breast, and colon cancer. She agrees to continue taking prescription Vit D @50 ,000 IU #4 every week and we will refill for one month. She will follow up for routine testing of vitamin D, at least 2-3 times per year. She was informed of the risk of over-replacement of vitamin D and agrees to not increase her dose unless he discusses this with [name] first. We will recheck labs and she will follow up with our clinic in 4 weeks.  Hyperlipidemia [name] was informed of the American Heart Association Guidelines  emphasizing intensive lifestyle modifications as the first line treatment for hyperlipidemia. We discussed many lifestyle modifications today in depth, and [name] will continue to work on decreasing saturated fats such as fatty red meat, butter and many fried foods. She will also increase vegetables and lean protein in her diet and continue to work on exercise and weight loss efforts. [name] agrees to continue taking her medications as prescribed. We will recheck labs and she will follow up with our clinic in 4 weeks.  Iron Deficiency Anemia We will recheck labs and [name] agrees to follow up with our clinic in 4 weeks.  Pre-Diabetes [name] will continue to work on weight loss, diet, exercise, and decreasing simple carbohydrates in her diet to help decrease the risk of diabetes. We dicussed metformin including benefits and risks. She was informed that eating too many simple carbohydrates or too many calories  at one sitting increases the likelihood of GI side effects. [name] declined metformin for now and a prescription was not written today. We will recheck labs and [name] agrees to follow up with our clinic in 4 weeks as directed to monitor her progress.  Obesity [name] is currently in the action stage of change. As such, her goal is to continue with weight loss efforts She has agreed to change to keep a food journal with 500 calories and 40 grams of  protein at supper and follow the Category 2 plan [name] has been instructed to work up to a goal of 150 minutes of combined cardio and strengthening exercise per week for weight loss and overall health benefits. We discussed the following Behavioral Modification Strategies today: increasing lean protein intake   [name] has agreed to follow up with our clinic in 4 weeks. She was informed of the importance of frequent follow up visits to maximize her success with intensive lifestyle modifications for her multiple health conditions.  I, Trixie Dredge, am acting  as transcriptionist for Lacy Duverney, PA-C  I have reviewed the above documentation for accuracy and completeness, and I agree with the above. -Lacy Duverney, PA-C  I have reviewed the above note and agree with the plan. -Dennard Nip, MD     Today's visit was # 16 out of 22.  Starting weight: 280 lbs Starting date: 08/31/16 Today's weight : 252 lbs Today's date: 05/10/2017 Total lbs lost to date: 38 (Patients must lose 7 lbs in the first 6 months to continue with counseling)   ASK: We discussed the diagnosis of obesity with [name] today and [name] agreed to give [name] permission to discuss obesity behavioral modification therapy today.  ASSESS: [name] has the diagnosis of obesity and her BMI today is 6 [name] is in the action stage of change   ADVISE: Jinelle was educated on the multiple health risks of obesity as well as the benefit of weight loss to improve her health. She was advised of the need for long term treatment and the importance of lifestyle modifications.  AGREE: Multiple dietary modification options and treatment options were discussed and  Yaiza agreed to keep a food journal with 500 calories and 40 grams of protein at supper and follow the Category 2 plan We discussed the following Behavioral Modification Strategies today: increasing lean protein intake

## 2017-05-11 LAB — CBC WITH DIFFERENTIAL
Basophils Absolute: 0 10*3/uL (ref 0.0–0.2)
Basos: 0 %
EOS (ABSOLUTE): 0.1 10*3/uL (ref 0.0–0.4)
Eos: 1 %
HEMOGLOBIN: 11 g/dL — AB (ref 11.1–15.9)
Hematocrit: 34.4 % (ref 34.0–46.6)
Immature Grans (Abs): 0 10*3/uL (ref 0.0–0.1)
Immature Granulocytes: 0 %
LYMPHS ABS: 1.3 10*3/uL (ref 0.7–3.1)
Lymphs: 22 %
MCH: 25.7 pg — AB (ref 26.6–33.0)
MCHC: 32 g/dL (ref 31.5–35.7)
MCV: 80 fL (ref 79–97)
MONOCYTES: 6 %
MONOS ABS: 0.4 10*3/uL (ref 0.1–0.9)
NEUTROS ABS: 4 10*3/uL (ref 1.4–7.0)
Neutrophils: 71 %
RBC: 4.28 x10E6/uL (ref 3.77–5.28)
RDW: 16.5 % — ABNORMAL HIGH (ref 12.3–15.4)
WBC: 5.7 10*3/uL (ref 3.4–10.8)

## 2017-05-11 LAB — LIPID PANEL WITH LDL/HDL RATIO
CHOLESTEROL TOTAL: 212 mg/dL — AB (ref 100–199)
HDL: 44 mg/dL (ref >39)
LDL Calculated: 145 mg/dL — ABNORMAL HIGH (ref 0–99)
LDl/HDL Ratio: 3.3 ratio — ABNORMAL HIGH (ref 0.0–3.2)
TRIGLYCERIDES: 116 mg/dL (ref 0–149)
VLDL CHOLESTEROL CAL: 23 mg/dL (ref 5–40)

## 2017-05-11 LAB — COMPREHENSIVE METABOLIC PANEL
A/G RATIO: 1.3 (ref 1.2–2.2)
ALT: 10 IU/L (ref 0–32)
AST: 12 IU/L (ref 0–40)
Albumin: 3.9 g/dL (ref 3.5–4.8)
Alkaline Phosphatase: 51 IU/L (ref 39–117)
BUN/Creatinine Ratio: 17 (ref 12–28)
BUN: 22 mg/dL (ref 8–27)
Bilirubin Total: 0.3 mg/dL (ref 0.0–1.2)
CALCIUM: 10.2 mg/dL (ref 8.7–10.3)
CO2: 24 mmol/L (ref 20–29)
CREATININE: 1.3 mg/dL — AB (ref 0.57–1.00)
Chloride: 105 mmol/L (ref 96–106)
GFR, EST AFRICAN AMERICAN: 47 mL/min/{1.73_m2} — AB (ref >59)
GFR, EST NON AFRICAN AMERICAN: 41 mL/min/{1.73_m2} — AB (ref >59)
GLUCOSE: 90 mg/dL (ref 65–99)
Globulin, Total: 3 g/dL (ref 1.5–4.5)
POTASSIUM: 4.2 mmol/L (ref 3.5–5.2)
Sodium: 143 mmol/L (ref 134–144)
TOTAL PROTEIN: 6.9 g/dL (ref 6.0–8.5)

## 2017-05-11 LAB — INSULIN, RANDOM: INSULIN: 10.5 u[IU]/mL (ref 2.6–24.9)

## 2017-05-11 LAB — HEMOGLOBIN A1C
ESTIMATED AVERAGE GLUCOSE: 148 mg/dL
Hgb A1c MFr Bld: 6.8 % — ABNORMAL HIGH (ref 4.8–5.6)

## 2017-05-11 LAB — VITAMIN D 25 HYDROXY (VIT D DEFICIENCY, FRACTURES): VIT D 25 HYDROXY: 39.3 ng/mL (ref 30.0–100.0)

## 2017-05-11 LAB — VITAMIN B12: Vitamin B-12: 1087 pg/mL (ref 232–1245)

## 2017-05-23 ENCOUNTER — Ambulatory Visit (INDEPENDENT_AMBULATORY_CARE_PROVIDER_SITE_OTHER): Payer: Medicare Other | Admitting: Dietician

## 2017-05-23 VITALS — Ht 66.0 in | Wt 258.0 lb

## 2017-05-23 DIAGNOSIS — Z6841 Body Mass Index (BMI) 40.0 and over, adult: Secondary | ICD-10-CM | POA: Diagnosis not present
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
## 2017-06-07 ENCOUNTER — Ambulatory Visit (INDEPENDENT_AMBULATORY_CARE_PROVIDER_SITE_OTHER): Payer: Medicare Other | Admitting: Physician Assistant

## 2017-06-07 VITALS — BP 152/77 | HR 74 | Temp 98.0°F | Ht 66.0 in | Wt 259.0 lb

## 2017-06-07 DIAGNOSIS — E559 Vitamin D deficiency, unspecified: Secondary | ICD-10-CM

## 2017-06-07 DIAGNOSIS — E1122 Type 2 diabetes mellitus with diabetic chronic kidney disease: Secondary | ICD-10-CM | POA: Diagnosis not present

## 2017-06-07 DIAGNOSIS — Z6841 Body Mass Index (BMI) 40.0 and over, adult: Secondary | ICD-10-CM | POA: Diagnosis not present

## 2017-06-07 DIAGNOSIS — I10 Essential (primary) hypertension: Secondary | ICD-10-CM | POA: Diagnosis not present

## 2017-06-07 DIAGNOSIS — N181 Chronic kidney disease, stage 1: Secondary | ICD-10-CM

## 2017-06-07 MED ORDER — VITAMIN D (ERGOCALCIFEROL) 1.25 MG (50000 UNIT) PO CAPS: 50000.0000 [IU] | ORAL_CAPSULE | ORAL | 0 refills | Status: DC

## 2017-06-07 MED ORDER — BLOOD GLUCOSE MONITOR KIT: PACK | 0 refills | Status: DC

## 2017-06-07 MED ORDER — HYDROCHLOROTHIAZIDE 12.5 MG PO TABS: 12.5000 mg | ORAL_TABLET | Freq: Every day | ORAL | 0 refills | Status: DC

## 2017-06-07 NOTE — Progress Notes (Signed)
Office: [phone]  /  Fax: [phone]   HPI:   Chief Complaint: OBESITY [name] is here to discuss her progress with her obesity treatment plan. She is on the Category 2 plan and is following her eating plan approximately 80 % of the time. She states she is exercising 0 minutes 0 times per week. [name] is retaining fluid. She states she has been following the meal plan but does eat out more.[name] would like more meal planning ideas. Her weight is 259 lb (117.5 kg) today and has had a weight gain of 7 pounds over a period of 4 weeks since her last visit. She has lost 21 lbs since starting treatment with [name].  Vitamin D deficiency [name] has a diagnosis of vitamin D deficiency. She is currently taking vit D and denies nausea, vomiting or muscle weakness.  Diabetes II with  CKD I [name] has a diagnosis of diabetes type II with nephropathy CKD I. Her last A1c was at 6.8 and she has a positive family history of diabetes in mother. [name] denies any hypoglycemic episodes. She has been working on intensive lifestyle modifications including diet, exercise, and weight loss to help control her blood glucose levels. [name] declines medication today, will start at her next visit.  Hypertension [name] is a 71 y.o. female with hypertension. Her blood pressure is elevated at 152/77. She states her blood pressure at home is stable, but she has been upset this morning. [name] denies chest pain or shortness of breath on exertion. She is working weight loss to help control her blood pressure with the goal of decreasing her risk of heart attack and stroke. [name] blood pressure is not currently controlled.   ALLERGIES: Allergies  Allergen Reactions  . Cymbalta [Duloxetine Hcl] Other (See Comments)    Stomach pain  . Gabapentin   . Lipitor [Atorvastatin] Other (See Comments)    Cramps  . Maxzide [Hydrochlorothiazide W-Triamterene] Other (See Comments)    Cramping   . Pravastatin Sodium  Other (See Comments)    Headache   . Simvastatin Other (See Comments)    Memory loss  . Lodine [Etodolac] Itching    MEDICATIONS: Current Outpatient Medications on File Prior to Visit  Medication Sig Dispense Refill  . amitriptyline (ELAVIL) 25 MG tablet Take 50 mg by mouth at bedtime.     [name] aspirin 81 MG tablet Take 81 mg by mouth daily.    [name] escitalopram (LEXAPRO) 10 MG tablet Take 10 mg by mouth daily.    . irbesartan (AVAPRO) 300 MG tablet Take 300 mg by mouth at bedtime.   0  . ketoconazole (NIZORAL) 2 % cream Apply 1 application topically daily. 15 g 0  . niacin 500 MG CR capsule Take 500 mg by mouth 2 (two) times daily with a meal.    . Omega-3 Fatty Acids (FISH OIL) 1200 MG CAPS Take 500 mg by mouth daily.     . tamoxifen (NOLVADEX) 20 MG tablet TAKE 1 TABLET BY MOUTH  DAILY 90 tablet 3  . verapamil (VERELAN PM) 240 MG 24 hr capsule Take 240 mg by mouth daily.     . vitamin B-12 (CYANOCOBALAMIN) 1000 MCG tablet Take 1,000 mcg by mouth daily.    . Vitamin D, Cholecalciferol, 400 UNITS TABS Take 400 Units by mouth at bedtime.     No current facility-administered medications on file prior to visit.     PAST MEDICAL HISTORY: Past Medical History:  Diagnosis Date  . Anemia   .  Anxiety    takes Xanax daily  . Arthritis   . Back pain   . Blood transfusion   . Breast cancer ([place]) 2013   left breast  . Bronchitis    hx of  . Chronic fatigue syndrome   . Depression    takes Lexapro daily  . Diabetes mellitus without complication ([place])    boderline  . Diverticulosis   . Edema    feet and legs  . Headache(784.0)   . History of colon polyps 1998   adenomatous  . Hyperlipidemia    takes Niacin daily  . Hypertension    takes Verapamil and Avapro daily  . Joint pain   . OSA (obstructive sleep apnea)   . Pneumonia 4/12   Albuterol daily as needed  . Radiation 10/29/11-11/24/11   left breast 6100 cGy  . Shortness of breath    with exertion  . Spinal stenosis   .  Tinnitus     PAST SURGICAL HISTORY: Past Surgical History:  Procedure Laterality Date  . APPENDECTOMY  1978  . BACK SURGERY    . BELPHAROPTOSIS REPAIR    . BREAST LUMPECTOMY  08/2011   left  . COLONOSCOPY W/ POLYPECTOMY    . JOINT REPLACEMENT Bilateral    bilateral knee  . POLYPECTOMY  1990's  . THYROID CYST EXCISION  1994  . TOTAL KNEE ARTHROPLASTY  2006   bilateral  . TRANSPHENOIDAL / TRANSNASAL HYPOPHYSECTOMY / RESECTION PITUITARY TUMOR  6/11    SOCIAL HISTORY: Social History   Tobacco Use  . Smoking status: Never Smoker  . Smokeless tobacco: Never Used  Substance Use Topics  . Alcohol use: No  . Drug use: No    FAMILY HISTORY: Family History  Problem Relation Age of Onset  . Heart disease Father   . Clotting disorder Father   . Stroke Father   . Hypertension Father   . Heart Problems Father   . Stroke Mother   . Diabetes Mother   . Hypertension Mother   . Hyperlipidemia Mother   . Obesity Mother   . Cancer Sister 40       Breast Cancer  . Colon cancer Neg Hx   . Neuropathy Neg Hx     ROS: Review of Systems  Constitutional: Negative for weight loss.  Respiratory: Negative for shortness of breath (on exertion).   Cardiovascular: Negative for chest pain.  Gastrointestinal: Negative for nausea and vomiting.  Musculoskeletal:       Negative muscle weakness  Endo/Heme/Allergies:       Negative hypoglycemia    PHYSICAL EXAM: Blood pressure (!) 152/77, pulse 74, temperature 98 F (36.7 C), height _0  (1.676 m), weight 259 lb (117.5 kg), SpO2 99 %. Body mass index is 41.8 kg/m. Physical Exam  Constitutional: [name], place, and time. She appears well-developed and well-nourished.  Cardiovascular: Normal rate.  Pulmonary/Chest: Effort normal.  Musculoskeletal: Normal range of motion.  Neurological: [name], place, and time.  Skin: Skin is warm and dry.  Psychiatric: She has a normal mood and affect. Her behavior  is normal.  Vitals reviewed.   RECENT LABS AND TESTS: BMET    Component Value Date/Time   NA 143 05/10/2017 1006   NA 140 06/01/2016 1143   K 4.2 05/10/2017 1006   K 4.0 06/01/2016 1143   CL 105 05/10/2017 1006   CL 105 12/09/2012 1423   CO2 24 05/10/2017 1006   CO2 26 06/01/2016  1143   GLUCOSE 90 05/10/2017 1006   GLUCOSE 139 (H) 08/21/2016 2027   GLUCOSE 121 06/01/2016 1143   GLUCOSE 177 (H) 12/09/2012 1423   BUN 22 05/10/2017 1006   BUN 12.5 06/01/2016 1143   CREATININE 1.30 (H) 05/10/2017 1006   CREATININE 1.1 06/01/2016 1143   CALCIUM 10.2 05/10/2017 1006   CALCIUM 10.1 06/01/2016 1143   GFRNONAA 41 (L) 05/10/2017 1006   GFRAA 47 (L) 05/10/2017 1006   Lab Results  Component Value Date   HGBA1C 6.8 (H) 05/10/2017   HGBA1C 6.2 (H) 12/14/2016   HGBA1C 6.3 (H) 08/31/2016   HGBA1C (H) 11/11/2010    6.7 (NOTE)                                                                       According to the ADA Clinical Practice Recommendations for 2011, when HbA1c is used as a screening test:   >=6.5%   Diagnostic of Diabetes Mellitus           (if abnormal result  is confirmed)  5.7-6.4%   Increased risk of developing Diabetes Mellitus  References:Diagnosis and Classification of Diabetes Mellitus,Diabetes IWPY,0998,33(ASNKN 1):S62-S69 and Standards of Medical Care in         Diabetes - 2011,Diabetes Care,2011,34  (Suppl 1):S11-S61.   Lab Results  Component Value Date   INSULIN 10.5 05/10/2017   INSULIN 20.9 12/14/2016   INSULIN 23.5 08/31/2016   CBC    Component Value Date/Time   WBC 5.7 05/10/2017 1006   WBC 14.5 (H) 08/21/2016 2027   RBC 4.28 05/10/2017 1006   RBC 4.46 08/21/2016 2027   HGB 11.0 (L) 05/10/2017 1006   HGB 11.1 (L) 06/01/2016 1143   HCT 34.4 05/10/2017 1006   HCT 34.9 06/01/2016 1143   PLT 267 08/21/2016 2027   PLT 248 06/01/2016 1143   MCV 80 05/10/2017 1006   MCV 81.2 06/01/2016 1143   MCH 25.7 (L) 05/10/2017 1006   MCH 26.5 08/21/2016 2027    MCHC 32.0 05/10/2017 1006   MCHC 33.1 08/21/2016 2027   RDW 16.5 (H) 05/10/2017 1006   RDW 15.8 (H) 06/01/2016 1143   LYMPHSABS 1.3 05/10/2017 1006   LYMPHSABS 1.8 06/01/2016 1143   MONOABS 0.6 06/01/2016 1143   EOSABS 0.1 05/10/2017 1006   BASOSABS 0.0 05/10/2017 1006   BASOSABS 0.0 06/01/2016 1143   Iron/TIBC/Ferritin/ %Sat    Component Value Date/Time   IRON 55 12/14/2016 1146   TIBC 329 12/14/2016 1146   FERRITIN 479 (H) 12/14/2016 1146   FERRITIN 362 (H) 04/15/2015 1058   IRONPCTSAT 17 12/14/2016 1146   Lipid Panel     Component Value Date/Time   CHOL 212 (H) 05/10/2017 1006   TRIG 116 05/10/2017 1006   HDL 44 05/10/2017 1006   CHOLHDL 6.4 11/11/2010 0555   VLDL 17 11/11/2010 0555   LDLCALC 145 (H) 05/10/2017 1006   Hepatic Function Panel     Component Value Date/Time   PROT 6.9 05/10/2017 1006   PROT 7.6 06/01/2016 1143   ALBUMIN 3.9 05/10/2017 1006   ALBUMIN 3.2 (L) 06/01/2016 1143   AST 12 05/10/2017 1006   AST 11 06/01/2016 1143   ALT 10 05/10/2017 1006   ALT 12 06/01/2016  6073   XTGGYIR 48 05/10/2017 1006   ALKPHOS 69 06/01/2016 1143   BILITOT 0.3 05/10/2017 1006   BILITOT 0.28 06/01/2016 1143    TSH 1.71 08/31/2016 TSH 1.570 01/20/2016 TSH 2.028 11/27/2009     ASSESSMENT AND PLAN: Type 2 diabetes mellitus with diabetic nephropathy, without long-term current use of insulin ([name]) - Plan: blood glucose meter kit and supplies KIT  Essential hypertension - Plan: hydrochlorothiazide (HYDRODIURIL) 12.5 MG tablet  Vitamin D deficiency - Plan: Vitamin D, Ergocalciferol, (DRISDOL) 50000 units CAPS capsule  Class 3 severe obesity with serious comorbidity and body mass index (BMI) of 40.0 to 44.9 in adult, unspecified obesity type (HCC)  PLAN:  Vitamin D Deficiency [name] was informed that low vitamin D levels contributes to fatigue and are associated with obesity, breast, and colon cancer. She agrees to continue to take prescription Vit D _0 ,000 IU  every week #4 with no refills and will follow up for routine testing of vitamin D, at least 2-3 times per year. She was informed of the risk of over-replacement of vitamin D and agrees to not increase her dose unless he discusses this with [name] first. [name] agrees to follow up with our clinic in 3 weeks.  Diabetes II with CKD I [name] has been given extensive diabetes education by myself today including ideal fasting and post-prandial blood glucose readings, individual ideal Hgb A1c goals  and hypoglycemia prevention. We discussed the importance of good blood sugar control to decrease the likelihood of diabetic complications such as nephropathy, neuropathy, limb loss, blindness, coronary artery disease, and death. We discussed the importance of intensive lifestyle modification including diet, exercise and weight loss as the first line treatment for diabetes. [name] agrees to start glucometer and will follow up at the agreed upon time.  Hypertension We discussed sodium restriction, working on healthy weight loss, and a regular exercise program as the means to achieve improved blood pressure control. [name] agreed with this plan and agreed to follow up as directed. We will continue to monitor her blood pressure as well as her progress with the above lifestyle modifications. She will continue her medications as prescribed, we will refill HCTZ 12.5 mg #30 and will watch for signs of hypotension as she continues her lifestyle modifications.  Obesity [name] is currently in the action stage of change. As such, her goal is to continue with weight loss efforts She has agreed to follow the Category 2 plan [name] has been instructed to work up to a goal of 150 minutes of combined cardio and strengthening exercise per week for weight loss and overall health benefits. We discussed the following Behavioral Modification Strategies today: increasing lean protein intake and work on meal planning and easy cooking plans  [name]  has agreed to follow up with our clinic in 3 weeks. She was informed of the importance of frequent follow up visits to maximize her success with intensive lifestyle modifications for her multiple health conditions.  I, Doreene Nest, am acting as transcriptionist for Lacy Duverney, PA-C  I have reviewed the above documentation for accuracy and completeness, and I agree with the above. -Lacy Duverney, PA-C  I have reviewed the above note and agree with the plan. -Dennard Nip, MD   OBESITY BEHAVIORAL INTERVENTION VISIT  Today's visit was # 17 out of 46.  Starting weight: 280 lbs Starting date: 08/31/16 Today's weight : 259 lbs Today's date: 06/07/2017 Total lbs lost to date: 21 (Patients must lose 7 lbs in the first 6 months  to continue with counseling)   ASK: We discussed the diagnosis of obesity with [name] today and [name] agreed to give [name] permission to discuss obesity behavioral modification therapy today.  ASSESS: [name] has the diagnosis of obesity and her BMI today is 41.82 [name] is in the action stage of change   ADVISE: Azani was educated on the multiple health risks of obesity as well as the benefit of weight loss to improve her health. She was advised of the need for long term treatment and the importance of lifestyle modifications.  AGREE: Multiple dietary modification options and treatment options were discussed and  Ginamarie agreed to follow the Category 2 plan We discussed the following Behavioral Modification Strategies today: increasing lean protein intake and work on meal planning and easy cooking plans

## 2017-06-14 ENCOUNTER — Encounter (INDEPENDENT_AMBULATORY_CARE_PROVIDER_SITE_OTHER): Payer: Medicare Other | Admitting: Ophthalmology

## 2017-06-14 DIAGNOSIS — H43812 Vitreous degeneration, left eye: Secondary | ICD-10-CM | POA: Diagnosis not present

## 2017-06-14 DIAGNOSIS — H43392 Other vitreous opacities, left eye: Secondary | ICD-10-CM | POA: Diagnosis not present

## 2017-06-14 DIAGNOSIS — Z961 Presence of intraocular lens: Secondary | ICD-10-CM | POA: Diagnosis not present

## 2017-06-14 DIAGNOSIS — H04223 Epiphora due to insufficient drainage, bilateral lacrimal glands: Secondary | ICD-10-CM | POA: Diagnosis not present

## 2017-06-15 ENCOUNTER — Encounter (INDEPENDENT_AMBULATORY_CARE_PROVIDER_SITE_OTHER): Payer: Medicare Other | Admitting: Ophthalmology

## 2017-06-15 DIAGNOSIS — H43813 Vitreous degeneration, bilateral: Secondary | ICD-10-CM

## 2017-06-15 DIAGNOSIS — H35033 Hypertensive retinopathy, bilateral: Secondary | ICD-10-CM | POA: Diagnosis not present

## 2017-06-15 DIAGNOSIS — I10 Essential (primary) hypertension: Secondary | ICD-10-CM

## 2017-06-15 DIAGNOSIS — H33302 Unspecified retinal break, left eye: Secondary | ICD-10-CM

## 2017-06-26 ENCOUNTER — Ambulatory Visit (INDEPENDENT_AMBULATORY_CARE_PROVIDER_SITE_OTHER): Payer: Medicare Other | Admitting: Physician Assistant

## 2017-06-26 VITALS — BP 131/68 | HR 78 | Temp 97.9°F | Ht 66.0 in | Wt 255.0 lb

## 2017-06-26 DIAGNOSIS — E1129 Type 2 diabetes mellitus with other diabetic kidney complication: Secondary | ICD-10-CM | POA: Diagnosis not present

## 2017-06-26 DIAGNOSIS — Z794 Long term (current) use of insulin: Secondary | ICD-10-CM

## 2017-06-26 DIAGNOSIS — Z6841 Body Mass Index (BMI) 40.0 and over, adult: Secondary | ICD-10-CM | POA: Diagnosis not present

## 2017-06-26 DIAGNOSIS — E559 Vitamin D deficiency, unspecified: Secondary | ICD-10-CM | POA: Diagnosis not present

## 2017-06-26 MED ORDER — METFORMIN HCL 500 MG PO TABS: 500.0000 mg | ORAL_TABLET | Freq: Every day | ORAL | 0 refills | Status: DC

## 2017-06-26 MED ORDER — VITAMIN D (ERGOCALCIFEROL) 1.25 MG (50000 UNIT) PO CAPS: 50000.0000 [IU] | ORAL_CAPSULE | ORAL | 0 refills | Status: DC

## 2017-06-26 NOTE — Progress Notes (Signed)
Office: [phone]  /  Fax: [phone]   HPI:   Chief Complaint: OBESITY [name] is here to discuss her progress with her obesity treatment plan. She is on the Category 2 plan and is following her eating plan approximately 80 % of the time. She states she is exercising 0 minutes 0 times per week. Randalyn continues to do well with weight loss. She continues to be mindful of her food choices and control her portions.  Her weight is 255 lb (115.7 kg) today and has had a weight loss of 4 pounds over a period of 2 to 3 weeks since her last visit. She has lost 25 lbs since starting treatment with [name].  Vitamin D deficiency [name] has a diagnosis of vitamin D deficiency. She is currently taking prescription Vit D and denies nausea, vomiting or muscle weakness.  Diabetes II with Chronic Kidney Disease [name] has a diagnosis of diabetes type II with chronic kidney disease. [name] states she is not checking her fasting BGs at home and she denies any hypoglycemic episodes. Last A1c was 6.8 on 05/10/17. She has been working on intensive lifestyle modifications including diet, exercise, and weight loss to help control her blood glucose levels.  ALLERGIES: Allergies  Allergen Reactions  . Cymbalta [Duloxetine Hcl] Other (See Comments)    Stomach pain  . Gabapentin   . Lipitor [Atorvastatin] Other (See Comments)    Cramps  . Maxzide [Hydrochlorothiazide W-Triamterene] Other (See Comments)    Cramping   . Pravastatin Sodium Other (See Comments)    Headache   . Simvastatin Other (See Comments)    Memory loss  . Lodine [Etodolac] Itching    MEDICATIONS: Current Outpatient Medications on File Prior to Visit  Medication Sig Dispense Refill  . amitriptyline (ELAVIL) 25 MG tablet Take 50 mg by mouth at bedtime.     [name] aspirin 81 MG tablet Take 81 mg by mouth daily.    . blood glucose meter kit and supplies KIT Dispense based on patient and insurance preference. Use up to four times daily as  directed. (FOR ICD-9 250.00, 250.01). 1 each 0  . escitalopram (LEXAPRO) 10 MG tablet Take 10 mg by mouth daily.    . hydrochlorothiazide (HYDRODIURIL) 12.5 MG tablet Take 1 tablet (12.5 mg total) daily by mouth. 30 tablet 0  . irbesartan (AVAPRO) 300 MG tablet Take 300 mg by mouth at bedtime.   0  . ketoconazole (NIZORAL) 2 % cream Apply 1 application topically daily. 15 g 0  . niacin 500 MG CR capsule Take 500 mg by mouth 2 (two) times daily with a meal.    . Omega-3 Fatty Acids (FISH OIL) 1200 MG CAPS Take 500 mg by mouth daily.     . tamoxifen (NOLVADEX) 20 MG tablet TAKE 1 TABLET BY MOUTH  DAILY 90 tablet 3  . verapamil (VERELAN PM) 240 MG 24 hr capsule Take 240 mg by mouth daily.     . vitamin B-12 (CYANOCOBALAMIN) 1000 MCG tablet Take 1,000 mcg by mouth daily.    . Vitamin D, Cholecalciferol, 400 UNITS TABS Take 400 Units by mouth at bedtime.    . Vitamin D, Ergocalciferol, (DRISDOL) 50000 units CAPS capsule Take 1 capsule (50,000 Units total) every 7 (seven) days by mouth. 4 capsule 0   No current facility-administered medications on file prior to visit.     PAST MEDICAL HISTORY: Past Medical History:  Diagnosis Date  . Anemia   . Anxiety    takes Xanax daily  .  Arthritis   . Back pain   . Blood transfusion   . Breast cancer ([place]) 2013   left breast  . Bronchitis    hx of  . Chronic fatigue syndrome   . Depression    takes Lexapro daily  . Diabetes mellitus without complication ([place])    boderline  . Diverticulosis   . Edema    feet and legs  . Headache(784.0)   . History of colon polyps 1998   adenomatous  . Hyperlipidemia    takes Niacin daily  . Hypertension    takes Verapamil and Avapro daily  . Joint pain   . OSA (obstructive sleep apnea)   . Pneumonia 4/12   Albuterol daily as needed  . Radiation 10/29/11-11/24/11   left breast 6100 cGy  . Shortness of breath    with exertion  . Spinal stenosis   . Tinnitus     PAST SURGICAL HISTORY: Past Surgical  History:  Procedure Laterality Date  . APPENDECTOMY  1978  . BACK SURGERY    . BELPHAROPTOSIS REPAIR    . BREAST LUMPECTOMY  08/2011   left  . BREAST LUMPECTOMY WITH NEEDLE LOCALIZATION Right 02/06/2013   Procedure: RIGHT BREAST LUMPECTOMY WITH NEEDLE LOCALIZATION;  Surgeon: Harl Bowie, MD;  Location: [facility];  Service: General;  Laterality: Right;  . COLONOSCOPY W/ POLYPECTOMY    . COLONOSCOPY WITH PROPOFOL N/A 10/27/2015   Procedure: COLONOSCOPY WITH PROPOFOL;  Surgeon: Mauri Pole, MD;  Location: [facility];  Service: Endoscopy;  Laterality: N/A;  . HARDWARE REMOVAL Left 04/20/2015   Procedure: HARDWARE REMOVAL LEFT LUMBAR FIVE SCREW;  Surgeon: Karie Chimera, MD;  Location: [facility];  Service: Neurosurgery;  Laterality: Left;  . JOINT REPLACEMENT Bilateral    bilateral knee  . POLYPECTOMY  1990's  . THYROID CYST EXCISION  1994  . TOTAL KNEE ARTHROPLASTY  2006   bilateral  . TRANSPHENOIDAL / TRANSNASAL HYPOPHYSECTOMY / RESECTION PITUITARY TUMOR  6/11    SOCIAL HISTORY: Social History   Tobacco Use  . Smoking status: Never Smoker  . Smokeless tobacco: Never Used  Substance Use Topics  . Alcohol use: No  . Drug use: No    FAMILY HISTORY: Family History  Problem Relation Age of Onset  . Heart disease Father   . Clotting disorder Father   . Stroke Father   . Hypertension Father   . Heart Problems Father   . Stroke Mother   . Diabetes Mother   . Hypertension Mother   . Hyperlipidemia Mother   . Obesity Mother   . Cancer Sister 93       Breast Cancer  . Colon cancer Neg Hx   . Neuropathy Neg Hx     ROS: Review of Systems  Constitutional: Positive for weight loss.  Gastrointestinal: Negative for nausea and vomiting.  Musculoskeletal:       Negative muscle weakness  Endo/Heme/Allergies:       Negative hypoglycemia    PHYSICAL EXAM: Blood pressure 131/68, pulse 78, temperature 97.9 F (36.6 C), temperature source Oral, height '5\' 6"'  (1.676 m), weight  255 lb (115.7 kg), SpO2 98 %. Body mass index is 41.16 kg/m. Physical Exam  Constitutional: [name], place, and time. She appears well-developed and well-nourished.  Cardiovascular: Normal rate.  Pulmonary/Chest: Effort normal.  Musculoskeletal: Normal range of motion.  Neurological: She is alert and oriented to person, place, and time.  Skin: Skin is warm and dry.  Psychiatric: She has  a normal mood and affect.    RECENT LABS AND TESTS: BMET    Component Value Date/Time   NA 143 05/10/2017 1006   NA 140 06/01/2016 1143   K 4.2 05/10/2017 1006   K 4.0 06/01/2016 1143   CL 105 05/10/2017 1006   CL 105 12/09/2012 1423   CO2 24 05/10/2017 1006   CO2 26 06/01/2016 1143   GLUCOSE 90 05/10/2017 1006   GLUCOSE 139 (H) 08/21/2016 2027   GLUCOSE 121 06/01/2016 1143   GLUCOSE 177 (H) 12/09/2012 1423   BUN 22 05/10/2017 1006   BUN 12.5 06/01/2016 1143   CREATININE 1.30 (H) 05/10/2017 1006   CREATININE 1.1 06/01/2016 1143   CALCIUM 10.2 05/10/2017 1006   CALCIUM 10.1 06/01/2016 1143   GFRNONAA 41 (L) 05/10/2017 1006   GFRAA 47 (L) 05/10/2017 1006   Lab Results  Component Value Date   HGBA1C 6.8 (H) 05/10/2017   HGBA1C 6.2 (H) 12/14/2016   HGBA1C 6.3 (H) 08/31/2016   HGBA1C (H) 11/11/2010    6.7 (NOTE)                                                                       According to the ADA Clinical Practice Recommendations for 2011, when HbA1c is used as a screening test:   >=6.5%   Diagnostic of Diabetes Mellitus           (if abnormal result  is confirmed)  5.7-6.4%   Increased risk of developing Diabetes Mellitus  References:Diagnosis and Classification of Diabetes Mellitus,Diabetes ITGP,4982,64(BRAXE 1):S62-S69 and Standards of Medical Care in         Diabetes - 2011,Diabetes Care,2011,34  (Suppl 1):S11-S61.   Lab Results  Component Value Date   INSULIN 10.5 05/10/2017   INSULIN 20.9 12/14/2016   INSULIN 23.5 08/31/2016   CBC    Component Value  Date/Time   WBC 5.7 05/10/2017 1006   WBC 14.5 (H) 08/21/2016 2027   RBC 4.28 05/10/2017 1006   RBC 4.46 08/21/2016 2027   HGB 11.0 (L) 05/10/2017 1006   HGB 11.1 (L) 06/01/2016 1143   HCT 34.4 05/10/2017 1006   HCT 34.9 06/01/2016 1143   PLT 267 08/21/2016 2027   PLT 248 06/01/2016 1143   MCV 80 05/10/2017 1006   MCV 81.2 06/01/2016 1143   MCH 25.7 (L) 05/10/2017 1006   MCH 26.5 08/21/2016 2027   MCHC 32.0 05/10/2017 1006   MCHC 33.1 08/21/2016 2027   RDW 16.5 (H) 05/10/2017 1006   RDW 15.8 (H) 06/01/2016 1143   LYMPHSABS 1.3 05/10/2017 1006   LYMPHSABS 1.8 06/01/2016 1143   MONOABS 0.6 06/01/2016 1143   EOSABS 0.1 05/10/2017 1006   BASOSABS 0.0 05/10/2017 1006   BASOSABS 0.0 06/01/2016 1143   Iron/TIBC/Ferritin/ %Sat    Component Value Date/Time   IRON 55 12/14/2016 1146   TIBC 329 12/14/2016 1146   FERRITIN 479 (H) 12/14/2016 1146   FERRITIN 362 (H) 04/15/2015 1058   IRONPCTSAT 17 12/14/2016 1146   Lipid Panel     Component Value Date/Time   CHOL 212 (H) 05/10/2017 1006   TRIG 116 05/10/2017 1006   HDL 44 05/10/2017 1006   CHOLHDL 6.4 11/11/2010 0555   VLDL 17 11/11/2010 0555  Egan 145 (H) 05/10/2017 1006   Hepatic Function Panel     Component Value Date/Time   PROT 6.9 05/10/2017 1006   PROT 7.6 06/01/2016 1143   ALBUMIN 3.9 05/10/2017 1006   ALBUMIN 3.2 (L) 06/01/2016 1143   AST 12 05/10/2017 1006   AST 11 06/01/2016 1143   ALT 10 05/10/2017 1006   ALT 12 06/01/2016 1143   ALKPHOS 51 05/10/2017 1006   ALKPHOS 69 06/01/2016 1143   BILITOT 0.3 05/10/2017 1006   BILITOT 0.28 06/01/2016 1143      Component Value Date/Time   TSH 1.710 08/31/2016 1000   TSH 1.570 01/20/2016 1634   TSH 2.028 Test methodology is 3rd generation TSH 11/27/2009 1004    ASSESSMENT AND PLAN: Vitamin D deficiency - Plan: Vitamin D, Ergocalciferol, (DRISDOL) 50000 units CAPS capsule  Type 2 diabetes mellitus with other diabetic kidney complication, with long-term  current use of insulin (HCC) - Plan: metFORMIN (GLUCOPHAGE) 500 MG tablet  Class 3 severe obesity with serious comorbidity and body mass index (BMI) of 40.0 to 44.9 in adult, unspecified obesity type ([place])  PLAN:  Vitamin D Deficiency [name] was informed that low vitamin D levels contributes to fatigue and are associated with obesity, breast, and colon cancer. [name] agrees to continue taking prescription Vit D '@50' ,000 IU every week #4 and we will refill for 1 month. She will follow up for routine testing of vitamin D, at least 2-3 times per year. She was informed of the risk of over-replacement of vitamin D and agrees to not increase her dose unless he discusses this with [name] first. [name] agrees to follow up with our clinic in 3 weeks.  Diabetes II with Chronic Kidney Disease [name] has been given extensive diabetes education by myself today including ideal fasting and post-prandial blood glucose readings, individual ideal Hgb A1c goals and hypoglycemia prevention. We discussed the importance of good blood sugar control to decrease the likelihood of diabetic complications such as nephropathy, neuropathy, limb loss, blindness, coronary artery disease, and death. We discussed the importance of intensive lifestyle modification including diet, exercise and weight loss as the first line treatment for diabetes. [name] agrees to start metformin 500 mg qd #30 with no refills and she agrees to follow up with our clinic in 3 weeks.  Obesity [name] is currently in the action stage of change. As such, her goal is to continue with weight loss efforts She has agreed to follow the Category 2 plan [name] has been instructed to work up to a goal of 150 minutes of combined cardio and strengthening exercise per week for weight loss and overall health benefits. We discussed the following Behavioral Modification Strategies today: increasing lean protein intake and work on meal planning and easy cooking plans   [name] has  agreed to follow up with our clinic in 3 weeks. She was informed of the importance of frequent follow up visits to maximize her success with intensive lifestyle modifications for her multiple health conditions.  I, Trixie Dredge, am acting as transcriptionist for Lacy Duverney, PA-C  I have reviewed the above documentation for accuracy and completeness, and I agree with the above. -Lacy Duverney, PA-C  I have reviewed the above note and agree with the plan. -Dennard Nip, MD     Today's visit was # 18 out of 22.  Starting weight: 280 lbs Starting date: 08/31/16 Today's weight : 255 lbs  Today's date: 06/26/2017 Total lbs lost to date: 25 (Patients must lose 7 lbs in  the first 6 months to continue with counseling)   ASK: We discussed the diagnosis of obesity with Luna Kitchens today and [place] agreed to give [name] permission to discuss obesity behavioral modification therapy today.  ASSESS: Flo has the diagnosis of obesity and her BMI today is 41.18 Cathern is in the action stage of change   ADVISE: Azani was educated on the multiple health risks of obesity as well as the benefit of weight loss to improve her health. She was advised of the need for long term treatment and the importance of lifestyle modifications.  AGREE: Multiple dietary modification options and treatment options were discussed and  Amnah agreed to follow the Category 2 plan We discussed the following Behavioral Modification Strategies today: increasing lean protein intake and work on meal planning and easy cooking plans

## 2017-07-05 ENCOUNTER — Other Ambulatory Visit: Payer: Self-pay | Admitting: Family Medicine

## 2017-07-05 DIAGNOSIS — Z9889 Other specified postprocedural states: Secondary | ICD-10-CM

## 2017-07-06 ENCOUNTER — Encounter (INDEPENDENT_AMBULATORY_CARE_PROVIDER_SITE_OTHER): Payer: Medicare Other | Admitting: Ophthalmology

## 2017-07-06 DIAGNOSIS — H33302 Unspecified retinal break, left eye: Secondary | ICD-10-CM

## 2017-07-16 ENCOUNTER — Ambulatory Visit (INDEPENDENT_AMBULATORY_CARE_PROVIDER_SITE_OTHER): Payer: Medicare Other | Admitting: Physician Assistant

## 2017-07-16 VITALS — BP 148/76 | HR 71 | Temp 98.1°F | Ht 66.0 in | Wt 256.0 lb

## 2017-07-16 DIAGNOSIS — I10 Essential (primary) hypertension: Secondary | ICD-10-CM

## 2017-07-16 DIAGNOSIS — Z6841 Body Mass Index (BMI) 40.0 and over, adult: Secondary | ICD-10-CM

## 2017-07-16 DIAGNOSIS — E559 Vitamin D deficiency, unspecified: Secondary | ICD-10-CM

## 2017-07-16 MED ORDER — DILTIAZEM HCL ER 120 MG PO CP12: 120.0000 mg | ORAL_CAPSULE | Freq: Two times a day (BID) | ORAL | 0 refills | Status: DC

## 2017-07-16 MED ORDER — VITAMIN D (ERGOCALCIFEROL) 1.25 MG (50000 UNIT) PO CAPS: 50000.0000 [IU] | ORAL_CAPSULE | ORAL | 0 refills | Status: DC

## 2017-07-16 MED ORDER — HYDROCHLOROTHIAZIDE 12.5 MG PO TABS: 12.5000 mg | ORAL_TABLET | Freq: Every day | ORAL | 0 refills | Status: DC

## 2017-07-16 NOTE — Progress Notes (Addendum)
Office: [phone]  /  Fax: [phone]   HPI:   Chief Complaint: OBESITY [name] is here to discuss her progress with her obesity treatment plan. She is on the Category 2 plan and is following her eating plan approximately 80 % of the time. She states she is exercising 0 minutes 0 times per week. [name] has been eating out more and has not been as mindful of her eating. She would like more convenience meal options. Her weight is 256 lb (116.1 kg) today and has had a weight gain of 1 pound over a period of 3 weeks since her last visit. She has lost 24 lbs since starting treatment with [name].  Hypertension [name] is a 71 y.o. female with hypertension. Tasharra Nodine denies chest pain or shortness of breath on exertion. She is working weight loss to help control her blood pressure with the goal of decreasing her risk of heart attack and stroke. [name] blood pressure is not currently stable.   Vitamin D deficiency [name] has a diagnosis of vitamin D deficiency. She is currently taking vit D and denies nausea, vomiting or muscle weakness.  ALLERGIES: Allergies  Allergen Reactions  . Cymbalta [Duloxetine Hcl] Other (See Comments)    Stomach pain  . Gabapentin   . Lipitor [Atorvastatin] Other (See Comments)    Cramps  . Maxzide [Hydrochlorothiazide W-Triamterene] Other (See Comments)    Cramping   . Pravastatin Sodium Other (See Comments)    Headache   . Simvastatin Other (See Comments)    Memory loss  . Lodine [Etodolac] Itching    MEDICATIONS: Current Outpatient Medications on File Prior to Visit  Medication Sig Dispense Refill  . amitriptyline (ELAVIL) 25 MG tablet Take 50 mg by mouth at bedtime.     [name] aspirin 81 MG tablet Take 81 mg by mouth daily.    . blood glucose meter kit and supplies KIT Dispense based on patient and insurance preference. Use up to four times daily as directed. (FOR ICD-9 250.00, 250.01). 1 each 0  . escitalopram (LEXAPRO) 10 MG tablet Take 10  mg by mouth daily.    . irbesartan (AVAPRO) 300 MG tablet Take 300 mg by mouth at bedtime.   0  . ketoconazole (NIZORAL) 2 % cream Apply 1 application topically daily. 15 g 0  . metFORMIN (GLUCOPHAGE) 500 MG tablet Take 1 tablet (500 mg total) by mouth daily with breakfast. 30 tablet 0  . niacin 500 MG CR capsule Take 500 mg by mouth 2 (two) times daily with a meal.    . Omega-3 Fatty Acids (FISH OIL) 1200 MG CAPS Take 500 mg by mouth daily.     . tamoxifen (NOLVADEX) 20 MG tablet TAKE 1 TABLET BY MOUTH  DAILY 90 tablet 3  . verapamil (VERELAN PM) 240 MG 24 hr capsule Take 240 mg by mouth daily.     . vitamin B-12 (CYANOCOBALAMIN) 1000 MCG tablet Take 1,000 mcg by mouth daily.    . Vitamin D, Cholecalciferol, 400 UNITS TABS Take 400 Units by mouth at bedtime.     No current facility-administered medications on file prior to visit.     PAST MEDICAL HISTORY: Past Medical History:  Diagnosis Date  . Anemia   . Anxiety    takes Xanax daily  . Arthritis   . Back pain   . Blood transfusion   . Breast cancer ([place]) 2013   left breast  . Bronchitis    hx of  . Chronic fatigue  syndrome   . Depression    takes Lexapro daily  . Diabetes mellitus without complication ([place])    boderline  . Diverticulosis   . Edema    feet and legs  . Headache(784.0)   . History of colon polyps 1998   adenomatous  . Hyperlipidemia    takes Niacin daily  . Hypertension    takes Verapamil and Avapro daily  . Joint pain   . OSA (obstructive sleep apnea)   . Pneumonia 4/12   Albuterol daily as needed  . Radiation 10/29/11-11/24/11   left breast 6100 cGy  . Shortness of breath    with exertion  . Spinal stenosis   . Tinnitus     PAST SURGICAL HISTORY: Past Surgical History:  Procedure Laterality Date  . APPENDECTOMY  1978  . BACK SURGERY    . BELPHAROPTOSIS REPAIR    . BREAST LUMPECTOMY  08/2011   left  . BREAST LUMPECTOMY WITH NEEDLE LOCALIZATION Right 02/06/2013   Procedure: RIGHT BREAST  LUMPECTOMY WITH NEEDLE LOCALIZATION;  Surgeon: Harl Bowie, MD;  Location: [facility];  Service: General;  Laterality: Right;  . COLONOSCOPY W/ POLYPECTOMY    . COLONOSCOPY WITH PROPOFOL N/A 10/27/2015   Procedure: COLONOSCOPY WITH PROPOFOL;  Surgeon: Mauri Pole, MD;  Location: [facility];  Service: Endoscopy;  Laterality: N/A;  . HARDWARE REMOVAL Left 04/20/2015   Procedure: HARDWARE REMOVAL LEFT LUMBAR FIVE SCREW;  Surgeon: Karie Chimera, MD;  Location: [facility];  Service: Neurosurgery;  Laterality: Left;  . JOINT REPLACEMENT Bilateral    bilateral knee  . POLYPECTOMY  1990's  . THYROID CYST EXCISION  1994  . TOTAL KNEE ARTHROPLASTY  2006   bilateral  . TRANSPHENOIDAL / TRANSNASAL HYPOPHYSECTOMY / RESECTION PITUITARY TUMOR  6/11    SOCIAL HISTORY: Social History   Tobacco Use  . Smoking status: Never Smoker  . Smokeless tobacco: Never Used  Substance Use Topics  . Alcohol use: No  . Drug use: No    FAMILY HISTORY: Family History  Problem Relation Age of Onset  . Heart disease Father   . Clotting disorder Father   . Stroke Father   . Hypertension Father   . Heart Problems Father   . Stroke Mother   . Diabetes Mother   . Hypertension Mother   . Hyperlipidemia Mother   . Obesity Mother   . Cancer Sister 70       Breast Cancer  . Colon cancer Neg Hx   . Neuropathy Neg Hx     ROS: Review of Systems  Constitutional: Negative for weight loss.  Respiratory: Negative for shortness of breath (on exertion).   Cardiovascular: Negative for chest pain.  Gastrointestinal: Negative for nausea and vomiting.  Musculoskeletal:       Negative muscle weakness    PHYSICAL EXAM: Blood pressure (!) 148/76, pulse 71, temperature 98.1 F (36.7 C), temperature source Oral, height '5\' 6"'  (1.676 m), weight 256 lb (116.1 kg), SpO2 100 %. Body mass index is 41.32 kg/m. Physical Exam  Constitutional: [name], place, and time. She appears well-developed and  well-nourished.  Cardiovascular: Normal rate.  Pulmonary/Chest: Effort normal.  Musculoskeletal: Normal range of motion.  Neurological: [name], place, and time.  Skin: Skin is warm and dry.  Psychiatric: She has a normal mood and affect. Her behavior is normal.  Vitals reviewed.   RECENT LABS AND TESTS: BMET    Component Value Date/Time   NA 143  05/10/2017 1006   NA 140 06/01/2016 1143   K 4.2 05/10/2017 1006   K 4.0 06/01/2016 1143   CL 105 05/10/2017 1006   CL 105 12/09/2012 1423   CO2 24 05/10/2017 1006   CO2 26 06/01/2016 1143   GLUCOSE 90 05/10/2017 1006   GLUCOSE 139 (H) 08/21/2016 2027   GLUCOSE 121 06/01/2016 1143   GLUCOSE 177 (H) 12/09/2012 1423   BUN 22 05/10/2017 1006   BUN 12.5 06/01/2016 1143   CREATININE 1.30 (H) 05/10/2017 1006   CREATININE 1.1 06/01/2016 1143   CALCIUM 10.2 05/10/2017 1006   CALCIUM 10.1 06/01/2016 1143   GFRNONAA 41 (L) 05/10/2017 1006   GFRAA 47 (L) 05/10/2017 1006   Lab Results  Component Value Date   HGBA1C 6.8 (H) 05/10/2017   HGBA1C 6.2 (H) 12/14/2016   HGBA1C 6.3 (H) 08/31/2016   HGBA1C (H) 11/11/2010    6.7 (NOTE)                                                                       According to the ADA Clinical Practice Recommendations for 2011, when HbA1c is used as a screening test:   >=6.5%   Diagnostic of Diabetes Mellitus           (if abnormal result  is confirmed)  5.7-6.4%   Increased risk of developing Diabetes Mellitus  References:Diagnosis and Classification of Diabetes Mellitus,Diabetes HUDJ,4970,26(VZCHY 1):S62-S69 and Standards of Medical Care in         Diabetes - 2011,Diabetes Care,2011,34  (Suppl 1):S11-S61.   Lab Results  Component Value Date   INSULIN 10.5 05/10/2017   INSULIN 20.9 12/14/2016   INSULIN 23.5 08/31/2016   CBC    Component Value Date/Time   WBC 5.7 05/10/2017 1006   WBC 14.5 (H) 08/21/2016 2027   RBC 4.28 05/10/2017 1006   RBC 4.46 08/21/2016 2027   HGB 11.0 (L)  05/10/2017 1006   HGB 11.1 (L) 06/01/2016 1143   HCT 34.4 05/10/2017 1006   HCT 34.9 06/01/2016 1143   PLT 267 08/21/2016 2027   PLT 248 06/01/2016 1143   MCV 80 05/10/2017 1006   MCV 81.2 06/01/2016 1143   MCH 25.7 (L) 05/10/2017 1006   MCH 26.5 08/21/2016 2027   MCHC 32.0 05/10/2017 1006   MCHC 33.1 08/21/2016 2027   RDW 16.5 (H) 05/10/2017 1006   RDW 15.8 (H) 06/01/2016 1143   LYMPHSABS 1.3 05/10/2017 1006   LYMPHSABS 1.8 06/01/2016 1143   MONOABS 0.6 06/01/2016 1143   EOSABS 0.1 05/10/2017 1006   BASOSABS 0.0 05/10/2017 1006   BASOSABS 0.0 06/01/2016 1143   Iron/TIBC/Ferritin/ %Sat    Component Value Date/Time   IRON 55 12/14/2016 1146   TIBC 329 12/14/2016 1146   FERRITIN 479 (H) 12/14/2016 1146   FERRITIN 362 (H) 04/15/2015 1058   IRONPCTSAT 17 12/14/2016 1146   Lipid Panel     Component Value Date/Time   CHOL 212 (H) 05/10/2017 1006   TRIG 116 05/10/2017 1006   HDL 44 05/10/2017 1006   CHOLHDL 6.4 11/11/2010 0555   VLDL 17 11/11/2010 0555   LDLCALC 145 (H) 05/10/2017 1006   Hepatic Function Panel     Component Value Date/Time   PROT 6.9 05/10/2017  1006   PROT 7.6 06/01/2016 1143   ALBUMIN 3.9 05/10/2017 1006   ALBUMIN 3.2 (L) 06/01/2016 1143   AST 12 05/10/2017 1006   AST 11 06/01/2016 1143   ALT 10 05/10/2017 1006   ALT 12 06/01/2016 1143   ALKPHOS 51 05/10/2017 1006   ALKPHOS 69 06/01/2016 1143   BILITOT 0.3 05/10/2017 1006   BILITOT 0.28 06/01/2016 1143      Component Value Date/Time   TSH 1.710 08/31/2016 1000   TSH 1.570 01/20/2016 1634   TSH 2.028 Test methodology is 3rd generation TSH 11/27/2009 1004    ASSESSMENT AND PLAN: Essential hypertension - Plan: hydrochlorothiazide (HYDRODIURIL) 25 MG tablet, DISCONTINUED: hydrochlorothiazide (HYDRODIURIL) 12.5 MG tablet, DISCONTINUED: diltiazem (CARDIZEM SR) 120 MG 12 hr capsule  Vitamin D deficiency - Plan: Vitamin D, Ergocalciferol, (DRISDOL) 50000 units CAPS capsule  Class 3 severe obesity  with serious comorbidity and body mass index (BMI) of 40.0 to 44.9 in adult, unspecified obesity type (HCC)  PLAN:  Hypertension We discussed sodium restriction, working on healthy weight loss, and a regular exercise program as the means to achieve improved blood pressure control. [name] agreed with this plan and agreed to follow up as directed. We will continue to monitor her blood pressure as well as her progress with the above lifestyle modifications. She has agreed to continue her irbesartan and verapamil and to increase her hydrodiuril to 25 mg once daily qd #30 with no refills and will watch for signs of hypotension as she continues her lifestyle modifications.  Vitamin D Deficiency [name] was informed that low vitamin D levels contributes to fatigue and are associated with obesity, breast, and colon cancer. She agrees to continue to take prescription Vit D '@50' ,000 IU every week #4 with no refills and will follow up for routine testing of vitamin D, at least 2-3 times per year. She was informed of the risk of over-replacement of vitamin D and agrees to not increase her dose unless he discusses this with [name] first. [name] agrees to follow up with our clinic in 2 weeks.  Obesity [name] is currently in the action stage of change. As such, her goal is to continue with weight loss efforts She has agreed to keep a food journal with 1200 calories and 85 grams of protein daily [name] has been instructed to work up to a goal of 150 minutes of combined cardio and strengthening exercise per week for weight loss and overall health benefits. We discussed the following Behavioral Modification Strategies today: increasing lean protein intake and emotional eating strategies  [name] has agreed to follow up with our clinic in 2 weeks. She was informed of the importance of frequent follow up visits to maximize her success with intensive lifestyle modifications for her multiple health conditions.  I, [name], am  acting as transcriptionist for Lacy Duverney, PA-C  I have reviewed the above documentation for accuracy and completeness, and I agree with the above. -Lacy Duverney, PA-C  I have reviewed the above note and agree with the plan. -Dennard Nip, MD   OBESITY BEHAVIORAL INTERVENTION VISIT  Today's visit was # 19 out of 22.  Starting weight: 280 lbs Starting date: 08/31/16 Today's weight : 256 lbs Today's date: 07/18/2017 Total lbs lost to date: 24 (Patients must lose 7 lbs in the first 6 months to continue with counseling)   ASK: We discussed the diagnosis of obesity with [name] today and [name] agreed to give [name] permission to discuss obesity behavioral modification therapy  today.  ASSESS: Dandrea has the diagnosis of obesity and her BMI today is 41.34 [name] is in the action stage of change   ADVISE: [name] was educated on the multiple health risks of obesity as well as the benefit of weight loss to improve her health. She was advised of the need for long term treatment and the importance of lifestyle modifications.  AGREE: Multiple dietary modification options and treatment options were discussed and  [place] agreed to keep a food journal with 1200 calories and 85 grams of protein daily We discussed the following Behavioral Modification Strategies today: increasing lean protein intake and emotional eating strategies  Addendum 07/17/17: Diltiazem was sent on 07/16/17 to pt's pharmacy. The medication was then cancelled on 07/17/17, as pt is already on Verapamil (calcium channel blocker). For better management of Pt's Bp Hydrodiuril is increased to 25 mg from 12.67m.

## 2017-07-17 ENCOUNTER — Other Ambulatory Visit (INDEPENDENT_AMBULATORY_CARE_PROVIDER_SITE_OTHER): Payer: Self-pay

## 2017-07-17 DIAGNOSIS — I10 Essential (primary) hypertension: Secondary | ICD-10-CM

## 2017-07-17 MED ORDER — HYDROCHLOROTHIAZIDE 25 MG PO TABS
25.0000 mg | ORAL_TABLET | Freq: Every day | ORAL | 0 refills | Status: DC
Start: 2017-07-17 — End: 2017-08-06

## 2017-07-17 NOTE — Telephone Encounter (Signed)
Spoke with the patient and informed her per PA Sahar to discontinue the Diltiazem at this time and she will be increasing her HCTZ to 25 mg daily. The patient states she has not picked the diltiazem up from the pharmacy yet. Also spoke with the pharmacy and had them remove the diltiazem from her profile. Patient verbalized understanding and changes were made to the patient's chart.   April, [place]

## 2017-07-18 MED ORDER — HYDROCHLOROTHIAZIDE 25 MG PO TABS: 25.0000 mg | ORAL_TABLET | Freq: Every day | ORAL | 0 refills | Status: DC

## 2017-07-29 ENCOUNTER — Other Ambulatory Visit (INDEPENDENT_AMBULATORY_CARE_PROVIDER_SITE_OTHER): Payer: Self-pay | Admitting: Physician Assistant

## 2017-07-29 DIAGNOSIS — Z794 Long term (current) use of insulin: Principal | ICD-10-CM

## 2017-07-29 DIAGNOSIS — E1129 Type 2 diabetes mellitus with other diabetic kidney complication: Secondary | ICD-10-CM

## 2017-08-06 ENCOUNTER — Ambulatory Visit (INDEPENDENT_AMBULATORY_CARE_PROVIDER_SITE_OTHER): Payer: Medicare Other | Admitting: Physician Assistant

## 2017-08-06 VITALS — BP 133/76 | HR 78 | Temp 98.1°F | Ht 66.0 in | Wt 255.0 lb

## 2017-08-06 DIAGNOSIS — I10 Essential (primary) hypertension: Secondary | ICD-10-CM

## 2017-08-06 DIAGNOSIS — E559 Vitamin D deficiency, unspecified: Secondary | ICD-10-CM

## 2017-08-06 DIAGNOSIS — Z6841 Body Mass Index (BMI) 40.0 and over, adult: Secondary | ICD-10-CM

## 2017-08-06 MED ORDER — LISINOPRIL 5 MG PO TABS: 5.0000 mg | ORAL_TABLET | Freq: Every day | ORAL | 3 refills | Status: DC

## 2017-08-06 NOTE — Progress Notes (Signed)
Office: [phone]  /  Fax: [phone]   HPI:   Chief Complaint: OBESITY [name] is here to discuss her progress with her obesity treatment plan. She is on the keep a food journal with 1200 calories and 85 grams of protein daily and is following her eating plan approximately 80 % of the time. She states she is exercising 0 minutes 0 times per week. [name] continues to do well with weight loss. She would like more convenient meal options.  Her weight is 255 lb (115.7 kg) today and has had a weight loss of 1 pound over a period of 3 weeks since her last visit. She has lost 25 lbs since starting treatment with [name].  Hypertension [name] is a 72 y.o. female with hypertension. [name]'s blood pressure is stable and she denies chest pain or shortness of breath. Due to recent recall of irbesartan, will discontinue medicine today. She is working weight loss to help control her blood pressure with the goal of decreasing her risk of heart attack and stroke. [name]'s blood pressure is currently controlled.  Vitamin D [name] [name]. She is currently taking vit D and denies nausea, vomiting or muscle weakness.  ALLERGIES: Allergies  Allergen Reactions  . Cymbalta [Duloxetine Hcl] Other (See Comments)    Stomach pain  . Gabapentin   . Lipitor [Atorvastatin] Other (See Comments)    Cramps  . Maxzide [Hydrochlorothiazide W-Triamterene] Other (See Comments)    Cramping   . Pravastatin Sodium Other (See Comments)    Headache   . Simvastatin Other (See Comments)    Memory loss  . Lodine [Etodolac] Itching    MEDICATIONS: Current Outpatient Medications on File Prior to Visit  Medication Sig Dispense Refill  . amitriptyline (ELAVIL) 25 MG tablet Take 50 mg by mouth at bedtime.     [name] aspirin 81 MG tablet Take 81 mg by mouth daily.    . blood glucose meter kit and supplies KIT Dispense based on patient and insurance preference. Use up to four  times daily as directed. (FOR ICD-9 250.00, 250.01). 1 each 0  . escitalopram (LEXAPRO) 10 MG tablet Take 10 mg by mouth daily.    . hydrochlorothiazide (HYDRODIURIL) 25 MG tablet Take 1 tablet (25 mg total) by mouth daily. 30 tablet 0  . ketoconazole (NIZORAL) 2 % cream Apply 1 application topically daily. 15 g 0  . metFORMIN (GLUCOPHAGE) 500 MG tablet take 1 tablet by mouth once daily with breakfast 30 tablet 0  . niacin 500 MG CR capsule Take 500 mg by mouth 2 (two) times daily with a meal.    . Omega-3 Fatty Acids (FISH OIL) 1200 MG CAPS Take 500 mg by mouth daily.     . tamoxifen (NOLVADEX) 20 MG tablet TAKE 1 TABLET BY MOUTH  DAILY 90 tablet 3  . verapamil (VERELAN PM) 240 MG 24 hr capsule Take 240 mg by mouth daily.     . vitamin B-12 (CYANOCOBALAMIN) 1000 MCG tablet Take 1,000 mcg by mouth daily.    . Vitamin D, Cholecalciferol, 400 UNITS TABS Take 400 Units by mouth at bedtime.    . Vitamin D, Ergocalciferol, (DRISDOL) 50000 units CAPS capsule Take 1 capsule (50,000 Units total) by mouth every 7 (seven) days. 4 capsule 0   No current facility-administered medications on file prior to visit.     PAST MEDICAL HISTORY: Past Medical History:  Diagnosis Date  . Anemia   . Anxiety  takes Xanax daily  . Arthritis   . Back pain   . Blood transfusion   . Breast cancer ([facility]) 2013   left breast  . Bronchitis    hx of  . Chronic fatigue syndrome   . Depression    takes Lexapro daily  . Diabetes mellitus without complication ([place])    boderline  . Diverticulosis   . Edema    feet and legs  . Headache(784.0)   . History of colon polyps 1998   adenomatous  . Hyperlipidemia    takes Niacin daily  . Hypertension    takes Verapamil and Avapro daily  . Joint pain   . OSA (obstructive sleep apnea)   . Pneumonia 4/12   Albuterol daily as needed  . Radiation 10/29/11-11/24/11   left breast 6100 cGy  . Shortness of breath    with exertion  . Spinal stenosis   . Tinnitus      PAST SURGICAL HISTORY: Past Surgical History:  Procedure Laterality Date  . APPENDECTOMY  1978  . BACK SURGERY    . BELPHAROPTOSIS REPAIR    . BREAST LUMPECTOMY  08/2011   left  . BREAST LUMPECTOMY WITH NEEDLE LOCALIZATION Right 02/06/2013   Procedure: RIGHT BREAST LUMPECTOMY WITH NEEDLE LOCALIZATION;  Surgeon: Harl Bowie, MD;  Location: [facility];  Service: General;  Laterality: Right;  . COLONOSCOPY W/ POLYPECTOMY    . COLONOSCOPY WITH PROPOFOL N/A 10/27/2015   Procedure: COLONOSCOPY WITH PROPOFOL;  Surgeon: Mauri Pole, MD;  Location: [facility];  Service: Endoscopy;  Laterality: N/A;  . HARDWARE REMOVAL Left 04/20/2015   Procedure: HARDWARE REMOVAL LEFT LUMBAR FIVE SCREW;  Surgeon: Karie Chimera, MD;  Location: [facility];  Service: Neurosurgery;  Laterality: Left;  . JOINT REPLACEMENT Bilateral    bilateral knee  . POLYPECTOMY  1990's  . THYROID CYST EXCISION  1994  . TOTAL KNEE ARTHROPLASTY  2006   bilateral  . TRANSPHENOIDAL / TRANSNASAL HYPOPHYSECTOMY / RESECTION PITUITARY TUMOR  6/11    SOCIAL HISTORY: Social History   Tobacco Use  . Smoking status: Never Smoker  . Smokeless tobacco: Never Used  Substance Use Topics  . Alcohol use: No  . Drug use: No    FAMILY HISTORY: Family History  Problem Relation Age of Onset  . Heart disease Father   . Clotting disorder Father   . Stroke Father   . Hypertension Father   . Heart Problems Father   . Stroke Mother   . Diabetes Mother   . Hypertension Mother   . Hyperlipidemia Mother   . Obesity Mother   . Cancer Sister 87       Breast Cancer  . Colon cancer Neg Hx   . Neuropathy Neg Hx     ROS: Review of Systems  Constitutional: Positive for weight loss.  Respiratory: Negative for shortness of breath.   Cardiovascular: Negative for chest pain.  Gastrointestinal: Negative for nausea and vomiting.  Musculoskeletal:       Negative muscle weakness    PHYSICAL EXAM: Blood pressure 133/76, pulse 78,  temperature 98.1 F (36.7 C), temperature source Oral, height 5' 6" (1.676 m), weight 255 lb (115.7 kg), SpO2 98 %. Body mass index is 41.16 kg/m. Physical Exam  Constitutional: [name], place, and time. She appears well-developed and well-nourished.  Cardiovascular: Normal rate.  Pulmonary/Chest: Effort normal.  Musculoskeletal: Normal range of motion.  Neurological: [name], place, and time.  Skin: Skin is  warm and dry.  Psychiatric: She has a normal mood and affect. Her behavior is normal.  Vitals reviewed.   RECENT LABS AND TESTS: BMET    Component Value Date/Time   NA 143 05/10/2017 1006   NA 140 06/01/2016 1143   K 4.2 05/10/2017 1006   K 4.0 06/01/2016 1143   CL 105 05/10/2017 1006   CL 105 12/09/2012 1423   CO2 24 05/10/2017 1006   CO2 26 06/01/2016 1143   GLUCOSE 90 05/10/2017 1006   GLUCOSE 139 (H) 08/21/2016 2027   GLUCOSE 121 06/01/2016 1143   GLUCOSE 177 (H) 12/09/2012 1423   BUN 22 05/10/2017 1006   BUN 12.5 06/01/2016 1143   CREATININE 1.30 (H) 05/10/2017 1006   CREATININE 1.1 06/01/2016 1143   CALCIUM 10.2 05/10/2017 1006   CALCIUM 10.1 06/01/2016 1143   GFRNONAA 41 (L) 05/10/2017 1006   GFRAA 47 (L) 05/10/2017 1006   Lab Results  Component Value Date   HGBA1C 6.8 (H) 05/10/2017   HGBA1C 6.2 (H) 12/14/2016   HGBA1C 6.3 (H) 08/31/2016   HGBA1C (H) 11/11/2010    6.7 (NOTE)                                                                       According to the ADA Clinical Practice Recommendations for 2011, when HbA1c is used as a screening test:   >=6.5%   Diagnostic of Diabetes Mellitus           (if abnormal result  is confirmed)  5.7-6.4%   Increased risk of developing Diabetes Mellitus  References:Diagnosis and Classification of Diabetes Mellitus,Diabetes GYJE,5631,49(FWYOV 1):S62-S69 and Standards of Medical Care in         Diabetes - 2011,Diabetes Care,2011,34  (Suppl 1):S11-S61.   Lab Results  Component Value  Date   INSULIN 10.5 05/10/2017   INSULIN 20.9 12/14/2016   INSULIN 23.5 08/31/2016   CBC    Component Value Date/Time   WBC 5.7 05/10/2017 1006   WBC 14.5 (H) 08/21/2016 2027   RBC 4.28 05/10/2017 1006   RBC 4.46 08/21/2016 2027   HGB 11.0 (L) 05/10/2017 1006   HGB 11.1 (L) 06/01/2016 1143   HCT 34.4 05/10/2017 1006   HCT 34.9 06/01/2016 1143   PLT 267 08/21/2016 2027   PLT 248 06/01/2016 1143   MCV 80 05/10/2017 1006   MCV 81.2 06/01/2016 1143   MCH 25.7 (L) 05/10/2017 1006   MCH 26.5 08/21/2016 2027   MCHC 32.0 05/10/2017 1006   MCHC 33.1 08/21/2016 2027   RDW 16.5 (H) 05/10/2017 1006   RDW 15.8 (H) 06/01/2016 1143   LYMPHSABS 1.3 05/10/2017 1006   LYMPHSABS 1.8 06/01/2016 1143   MONOABS 0.6 06/01/2016 1143   EOSABS 0.1 05/10/2017 1006   BASOSABS 0.0 05/10/2017 1006   BASOSABS 0.0 06/01/2016 1143   Iron/TIBC/Ferritin/ %Sat    Component Value Date/Time   IRON 55 12/14/2016 1146   TIBC 329 12/14/2016 1146   FERRITIN 479 (H) 12/14/2016 1146   FERRITIN 362 (H) 04/15/2015 1058   IRONPCTSAT 17 12/14/2016 1146   Lipid Panel     Component Value Date/Time   CHOL 212 (H) 05/10/2017 1006   TRIG 116 05/10/2017 1006   HDL 44 05/10/2017 1006  CHOLHDL 6.4 11/11/2010 0555   VLDL 17 11/11/2010 0555   LDLCALC 145 (H) 05/10/2017 1006   Hepatic Function Panel     Component Value Date/Time   PROT 6.9 05/10/2017 1006   PROT 7.6 06/01/2016 1143   ALBUMIN 3.9 05/10/2017 1006   ALBUMIN 3.2 (L) 06/01/2016 1143   AST 12 05/10/2017 1006   AST 11 06/01/2016 1143   ALT 10 05/10/2017 1006   ALT 12 06/01/2016 1143   ALKPHOS 51 05/10/2017 1006   ALKPHOS 69 06/01/2016 1143   BILITOT 0.3 05/10/2017 1006   BILITOT 0.28 06/01/2016 1143   Results for RYLEE, HUESTIS (MRN [id number]) as of 08/06/2017 16:18  Ref. Range 05/10/2017 10:06  Vitamin D, 25-Hydroxy Latest Ref Range: 30.0 - 100.0 ng/mL 39.3    ASSESSMENT AND PLAN: Essential hypertension - Plan: lisinopril  (PRINIVIL,ZESTRIL) 5 MG tablet  Vitamin D [name]  Class 3 severe obesity with serious comorbidity and body mass index (BMI) of 40.0 to 44.9 in adult, unspecified obesity type ([facility])  PLAN:  Hypertension We discussed sodium restriction, working on healthy weight loss, and a regular exercise program as the means to achieve improved blood pressure control. Jazlyne agreed with this plan and agreed to follow up as directed. We will continue to monitor her blood pressure as well as her progress with the above lifestyle modifications. Bryana agrees to stop irbesartan due to recall and she agrees to start lisinopril 5 mg qd #90 and we will refill for 3 months. [name] agrees to continue taking hydrochlorothiazide 25 mg qd and she will watch for signs of hypotension as she continues her lifestyle modifications. [name] agrees to follow up with our clinic in 3 weeks.  Vitamin D [name] [name] was informed that low vitamin D levels contributes to fatigue and are associated with obesity, breast, and colon cancer. [name] agrees to continue taking prescription Vit D _0 ,000 IU every week #4 and will follow up for routine testing of vitamin D, at least 2-3 times per year. She was informed of the risk of over-replacement of vitamin D and agrees to not increase her dose unless he discusses this with [name] first. [name] agrees to follow up with our clinic in 3 weeks.  Obesity [name] is currently in the action stage of change. As such, her goal is to continue with weight loss efforts She has agreed to keep a food journal with 1200 calories and 85 grams of protein daily [name] has been instructed to work up to a goal of 150 minutes of combined cardio and strengthening exercise per week for weight loss and overall health benefits. We discussed the following Behavioral Modification Strategies today: increasing lean protein intake, work on meal planning and easy cooking plans, and planning for success   [name] has agreed to  follow up with our clinic in 3 weeks. She was informed of the importance of frequent follow up visits to maximize her success with intensive lifestyle modifications for her multiple health conditions.      OBESITY BEHAVIORAL INTERVENTION VISIT  Today's visit was # 20 out of 22.  Starting weight: 280 lbs Starting date: 08/31/16 Today's weight : 255 lbs  Today's date: 08/06/2017 Total lbs lost to date: 25 (Patients must lose 7 lbs in the first 6 months to continue with counseling)   ASK: We discussed the diagnosis of obesity with [name] today and [name] agreed to give [name] permission to discuss obesity behavioral modification therapy today.  ASSESS: [name] has the diagnosis of obesity and  her BMI today is 41.18 [name] is in the action stage of change   ADVISE: [name] was educated on the multiple health risks of obesity as well as the benefit of weight loss to improve her health. She was advised of the need for long term treatment and the importance of lifestyle modifications.  AGREE: Multiple dietary modification options and treatment options were discussed and  [name] agreed to keep a food journal with 1200 calories and 85 grams of protein daily We discussed the following Behavioral Modification Strategies today: increasing lean protein intake, work on meal planning and easy cooking plans, and planning for success    I, Trixie Dredge, am acting as transcriptionist for [employer], [place] PAC have reviewed this note and agree with above  I have reviewed the above documentation for accuracy and completeness, and I agree with the above. -Dennard Nip, MD

## 2017-08-09 DIAGNOSIS — H1820 Unspecified corneal edema: Secondary | ICD-10-CM | POA: Diagnosis not present

## 2017-08-09 DIAGNOSIS — T8579XA Infection and inflammatory reaction due to other internal prosthetic devices, implants and grafts, initial encounter: Secondary | ICD-10-CM | POA: Diagnosis not present

## 2017-08-09 DIAGNOSIS — H04213 Epiphora due to excess lacrimation, bilateral lacrimal glands: Secondary | ICD-10-CM | POA: Diagnosis not present

## 2017-08-09 DIAGNOSIS — H04123 Dry eye syndrome of bilateral lacrimal glands: Secondary | ICD-10-CM | POA: Diagnosis not present

## 2017-08-10 ENCOUNTER — Ambulatory Visit
Admission: RE | Admit: 2017-08-10 | Discharge: 2017-08-10 | Disposition: A | Discharge status: Home / Self Care | Payer: Medicare Other | Source: Ambulatory Visit | Attending: Family Medicine | Admitting: Family Medicine

## 2017-08-10 DIAGNOSIS — R928 Other abnormal and inconclusive findings on diagnostic imaging of breast: Secondary | ICD-10-CM | POA: Diagnosis not present

## 2017-08-10 DIAGNOSIS — Z9889 Other specified postprocedural states: Secondary | ICD-10-CM

## 2017-08-10 HISTORY — DX: Personal history of irradiation: Z92.3

## 2017-08-13 ENCOUNTER — Emergency Department (HOSPITAL_COMMUNITY)
Admission: EM | Admit: 2017-08-13 | Discharge: 2017-08-13 | Disposition: A | Discharge status: Home / Self Care | Payer: Medicare Other | Attending: Emergency Medicine | Admitting: Emergency Medicine

## 2017-08-13 ENCOUNTER — Encounter (HOSPITAL_COMMUNITY): Payer: Self-pay | Admitting: Emergency Medicine

## 2017-08-13 DIAGNOSIS — T783XXA Angioneurotic edema, initial encounter: Secondary | ICD-10-CM | POA: Insufficient documentation

## 2017-08-13 DIAGNOSIS — Z79899 Other long term (current) drug therapy: Secondary | ICD-10-CM | POA: Diagnosis not present

## 2017-08-13 DIAGNOSIS — Z7984 Long term (current) use of oral hypoglycemic drugs: Secondary | ICD-10-CM | POA: Diagnosis not present

## 2017-08-13 DIAGNOSIS — I5030 Unspecified diastolic (congestive) heart failure: Secondary | ICD-10-CM | POA: Diagnosis not present

## 2017-08-13 DIAGNOSIS — E119 Type 2 diabetes mellitus without complications: Secondary | ICD-10-CM | POA: Diagnosis not present

## 2017-08-13 DIAGNOSIS — Z853 Personal history of malignant neoplasm of breast: Secondary | ICD-10-CM | POA: Insufficient documentation

## 2017-08-13 DIAGNOSIS — I110 Hypertensive heart disease with heart failure: Secondary | ICD-10-CM | POA: Insufficient documentation

## 2017-08-13 DIAGNOSIS — Z7982 Long term (current) use of aspirin: Secondary | ICD-10-CM | POA: Insufficient documentation

## 2017-08-13 DIAGNOSIS — R600 Localized edema: Secondary | ICD-10-CM | POA: Diagnosis present

## 2017-08-13 DIAGNOSIS — Z923 Personal history of irradiation: Secondary | ICD-10-CM | POA: Insufficient documentation

## 2017-08-13 MED ORDER — DEXAMETHASONE 4 MG PO TABS
8.0000 mg | ORAL_TABLET | Freq: Once | ORAL | Status: AC
  Administered 2017-08-13: 8 mg via ORAL
  Filled 2017-08-13: qty 2

## 2017-08-13 MED ORDER — DIPHENHYDRAMINE HCL 25 MG PO CAPS
25.0000 mg | ORAL_CAPSULE | Freq: Once | ORAL | Status: AC
  Administered 2017-08-13: 25 mg via ORAL
  Filled 2017-08-13: qty 1

## 2017-08-13 NOTE — ED Triage Notes (Signed)
patient reports that she started on Lisinopril last night and this morning woke up with facial swelling. Denies swelling to tongue or feels SOB. Able to speak in full sentences.

## 2017-08-14 ENCOUNTER — Ambulatory Visit (INDEPENDENT_AMBULATORY_CARE_PROVIDER_SITE_OTHER): Payer: Medicare Other | Admitting: Physician Assistant

## 2017-08-14 VITALS — BP 122/71 | HR 88 | Temp 98.2°F | Ht 66.0 in | Wt 254.0 lb

## 2017-08-14 DIAGNOSIS — Z6841 Body Mass Index (BMI) 40.0 and over, adult: Secondary | ICD-10-CM

## 2017-08-14 DIAGNOSIS — I10 Essential (primary) hypertension: Secondary | ICD-10-CM

## 2017-08-14 NOTE — Progress Notes (Addendum)
Office: [phone]  /  Fax: [phone]   HPI:   Chief Complaint: OBESITY [name] is here to discuss her progress with her obesity treatment plan. She is on the keep a food journal with 1200 calories and 85 grams of protein daily and is following her eating plan approximately 80 % of the time. She states she is exercising 0 minutes 0 times per week. [name] continues to do well with weight loss. [name] is with a short follow up period due to developing an allergic reaction to Lisinopril that was added recently. Her weight is 254 lb (115.2 kg) today and has had a weight loss of 1 pound over a period of 1 week since her last visit. She has lost 26 lbs since starting treatment with [name].  Hypertension [name] is a 72 y.o. female with hypertension. She is stable on this visit at 122/71. She started Lisinopril last visit, and developed facial swelling. [name] stopped taking the Lisinopril. She has not taken her Irbesartan and blood pressure today is well controlled. [name] denies chest pain or shortness of breath on exertion. She is working weight loss to help control her blood pressure with the goal of decreasing her risk of heart attack and stroke.  ALLERGIES: Allergies  Allergen Reactions  . Cymbalta [Duloxetine Hcl] Other (See Comments)    Stomach pain  . Gabapentin Itching  . Lipitor [Atorvastatin] Other (See Comments)    Cramps  . Lisinopril Swelling  . Maxzide [Hydrochlorothiazide W-Triamterene] Other (See Comments)    Cramping   . Pravastatin Sodium Other (See Comments)    Headache   . Simvastatin Other (See Comments)    Memory loss  . Lodine [Etodolac] Itching    MEDICATIONS: Current Outpatient Medications on File Prior to Visit  Medication Sig Dispense Refill  . amitriptyline (ELAVIL) 25 MG tablet Take 25 mg by mouth every morning.     [name] aspirin 81 MG tablet Take 81 mg by mouth daily.    . blood glucose meter kit and supplies KIT Dispense based on patient  and insurance preference. Use up to four times daily as directed. (FOR ICD-9 250.00, 250.01). 1 each 0  . DUREZOL 0.05 % EMUL Place 1 drop into the left eye daily.  0  . escitalopram (LEXAPRO) 10 MG tablet Take 10 mg by mouth daily.    . fluticasone (CUTIVATE) 0.05 % cream Apply 1 application topically 2 (two) times daily.  0  . hydrochlorothiazide (HYDRODIURIL) 25 MG tablet Take 1 tablet (25 mg total) by mouth daily. 30 tablet 0  . ketoconazole (NIZORAL) 2 % cream Apply 1 application topically daily. 15 g 0  . metFORMIN (GLUCOPHAGE) 500 MG tablet take 1 tablet by mouth once daily with breakfast 30 tablet 0  . niacin 500 MG CR capsule Take 500 mg by mouth 2 (two) times daily with a meal.    . Omega-3 Fatty Acids (FISH OIL) 1200 MG CAPS Take 500 mg by mouth daily.     . tamoxifen (NOLVADEX) 20 MG tablet TAKE 1 TABLET BY MOUTH  DAILY 90 tablet 3  . verapamil (VERELAN PM) 240 MG 24 hr capsule Take 240 mg by mouth daily.     . vitamin B-12 (CYANOCOBALAMIN) 1000 MCG tablet Take 1,000 mcg by mouth daily.    . Vitamin D, Cholecalciferol, 400 UNITS TABS Take 400 Units by mouth at bedtime.    . Vitamin D, Ergocalciferol, (DRISDOL) 50000 units CAPS capsule Take 1 capsule (50,000 Units total) by mouth  every 7 (seven) days. 4 capsule 0   No current facility-administered medications on file prior to visit.     PAST MEDICAL HISTORY: Past Medical History:  Diagnosis Date  . Anemia   . Anxiety    takes Xanax daily  . Arthritis   . Back pain   . Blood transfusion   . Breast cancer ([place]) 2013   left breast  . Bronchitis    hx of  . Chronic fatigue syndrome   . Depression    takes Lexapro daily  . Diabetes mellitus without complication ([place])    boderline  . Diverticulosis   . Edema    feet and legs  . Headache(784.0)   . History of colon polyps 1998   adenomatous  . Hyperlipidemia    takes Niacin daily  . Hypertension    takes Verapamil and Avapro daily  . Joint pain   . OSA (obstructive  sleep apnea)   . Personal history of radiation therapy   . Pneumonia 4/12   Albuterol daily as needed  . Radiation 10/29/11-11/24/11   left breast 6100 cGy  . Shortness of breath    with exertion  . Spinal stenosis   . Tinnitus     PAST SURGICAL HISTORY: Past Surgical History:  Procedure Laterality Date  . APPENDECTOMY  1978  . BACK SURGERY    . BELPHAROPTOSIS REPAIR    . BREAST EXCISIONAL BIOPSY Right 02/06/2013  . BREAST LUMPECTOMY  08/2011   left  . BREAST LUMPECTOMY WITH NEEDLE LOCALIZATION Right 02/06/2013   Procedure: RIGHT BREAST LUMPECTOMY WITH NEEDLE LOCALIZATION;  Surgeon: Harl Bowie, MD;  Location: [facility];  Service: General;  Laterality: Right;  . COLONOSCOPY W/ POLYPECTOMY    . COLONOSCOPY WITH PROPOFOL N/A 10/27/2015   Procedure: COLONOSCOPY WITH PROPOFOL;  Surgeon: Mauri Pole, MD;  Location: [facility];  Service: Endoscopy;  Laterality: N/A;  . HARDWARE REMOVAL Left 04/20/2015   Procedure: HARDWARE REMOVAL LEFT LUMBAR FIVE SCREW;  Surgeon: Karie Chimera, MD;  Location: [facility];  Service: Neurosurgery;  Laterality: Left;  . JOINT REPLACEMENT Bilateral    bilateral knee  . POLYPECTOMY  1990's  . THYROID CYST EXCISION  1994  . TOTAL KNEE ARTHROPLASTY  2006   bilateral  . TRANSPHENOIDAL / TRANSNASAL HYPOPHYSECTOMY / RESECTION PITUITARY TUMOR  6/11    SOCIAL HISTORY: Social History   Tobacco Use  . Smoking status: Never Smoker  . Smokeless tobacco: Never Used  Substance Use Topics  . Alcohol use: No  . Drug use: No    FAMILY HISTORY: Family History  Problem Relation Age of Onset  . Heart disease Father   . Clotting disorder Father   . Stroke Father   . Hypertension Father   . Heart Problems Father   . Stroke Mother   . Diabetes Mother   . Hypertension Mother   . Hyperlipidemia Mother   . Obesity Mother   . Cancer Sister 86       Breast Cancer  . Breast cancer Sister 55  . Colon cancer Neg Hx   . Neuropathy Neg Hx     ROS: Review  of Systems  Constitutional: Positive for weight loss.  Respiratory: Negative for shortness of breath (on exertion).   Cardiovascular: Negative for chest pain.    PHYSICAL EXAM: Blood pressure 122/71, pulse 88, temperature 98.2 F (36.8 C), temperature source Oral, height _0  (1.676 m), weight 254 lb (115.2 kg), SpO2 97 %. Body mass index is 41  kg/m. Physical Exam  Constitutional: [name], place, and time. She appears well-developed and well-nourished.  Cardiovascular: Normal rate.  Pulmonary/Chest: Effort normal.  Musculoskeletal: Normal range of motion.  Neurological: [name], place, and time.  Skin: Skin is warm and dry.  Psychiatric: She has a normal mood and affect. Her behavior is normal.  Vitals reviewed.   RECENT LABS AND TESTS: BMET    Component Value Date/Time   NA 143 05/10/2017 1006   NA 140 06/01/2016 1143   K 4.2 05/10/2017 1006   K 4.0 06/01/2016 1143   CL 105 05/10/2017 1006   CL 105 12/09/2012 1423   CO2 24 05/10/2017 1006   CO2 26 06/01/2016 1143   GLUCOSE 90 05/10/2017 1006   GLUCOSE 139 (H) 08/21/2016 2027   GLUCOSE 121 06/01/2016 1143   GLUCOSE 177 (H) 12/09/2012 1423   BUN 22 05/10/2017 1006   BUN 12.5 06/01/2016 1143   CREATININE 1.30 (H) 05/10/2017 1006   CREATININE 1.1 06/01/2016 1143   CALCIUM 10.2 05/10/2017 1006   CALCIUM 10.1 06/01/2016 1143   GFRNONAA 41 (L) 05/10/2017 1006   GFRAA 47 (L) 05/10/2017 1006   Lab Results  Component Value Date   HGBA1C 6.8 (H) 05/10/2017   HGBA1C 6.2 (H) 12/14/2016   HGBA1C 6.3 (H) 08/31/2016   HGBA1C (H) 11/11/2010    6.7 (NOTE)                                                                       According to the ADA Clinical Practice Recommendations for 2011, when HbA1c is used as a screening test:   >=6.5%   Diagnostic of Diabetes Mellitus           (if abnormal result  is confirmed)  5.7-6.4%   Increased risk of developing Diabetes Mellitus  References:Diagnosis  and Classification of Diabetes Mellitus,Diabetes SHFW,2637,85(YIFOY 1):S62-S69 and Standards of Medical Care in         Diabetes - 2011,Diabetes Care,2011,34  (Suppl 1):S11-S61.   Lab Results  Component Value Date   INSULIN 10.5 05/10/2017   INSULIN 20.9 12/14/2016   INSULIN 23.5 08/31/2016   CBC    Component Value Date/Time   WBC 5.7 05/10/2017 1006   WBC 14.5 (H) 08/21/2016 2027   RBC 4.28 05/10/2017 1006   RBC 4.46 08/21/2016 2027   HGB 11.0 (L) 05/10/2017 1006   HGB 11.1 (L) 06/01/2016 1143   HCT 34.4 05/10/2017 1006   HCT 34.9 06/01/2016 1143   PLT 267 08/21/2016 2027   PLT 248 06/01/2016 1143   MCV 80 05/10/2017 1006   MCV 81.2 06/01/2016 1143   MCH 25.7 (L) 05/10/2017 1006   MCH 26.5 08/21/2016 2027   MCHC 32.0 05/10/2017 1006   MCHC 33.1 08/21/2016 2027   RDW 16.5 (H) 05/10/2017 1006   RDW 15.8 (H) 06/01/2016 1143   LYMPHSABS 1.3 05/10/2017 1006   LYMPHSABS 1.8 06/01/2016 1143   MONOABS 0.6 06/01/2016 1143   EOSABS 0.1 05/10/2017 1006   BASOSABS 0.0 05/10/2017 1006   BASOSABS 0.0 06/01/2016 1143   Iron/TIBC/Ferritin/ %Sat    Component Value Date/Time   IRON 55 12/14/2016 1146   TIBC 329 12/14/2016 1146   FERRITIN 479 (  H) 12/14/2016 1146   FERRITIN 362 (H) 04/15/2015 1058   IRONPCTSAT 17 12/14/2016 1146   Lipid Panel     Component Value Date/Time   CHOL 212 (H) 05/10/2017 1006   TRIG 116 05/10/2017 1006   HDL 44 05/10/2017 1006   CHOLHDL 6.4 11/11/2010 0555   VLDL 17 11/11/2010 0555   LDLCALC 145 (H) 05/10/2017 1006   Hepatic Function Panel     Component Value Date/Time   PROT 6.9 05/10/2017 1006   PROT 7.6 06/01/2016 1143   ALBUMIN 3.9 05/10/2017 1006   ALBUMIN 3.2 (L) 06/01/2016 1143   AST 12 05/10/2017 1006   AST 11 06/01/2016 1143   ALT 10 05/10/2017 1006   ALT 12 06/01/2016 1143   ALKPHOS 51 05/10/2017 1006   ALKPHOS 69 06/01/2016 1143   BILITOT 0.3 05/10/2017 1006   BILITOT 0.28 06/01/2016 1143      Component Value Date/Time   TSH  1.710 08/31/2016 1000   TSH 1.570 01/20/2016 1634   TSH 2.028 Test methodology is 3rd generation TSH 11/27/2009 1004    ASSESSMENT AND PLAN: Essential hypertension  Class 3 severe obesity with serious comorbidity and body mass index (BMI) of 40.0 to 44.9 in adult, unspecified obesity type (HCC)  PLAN:  Hypertension We discussed sodium restriction, working on healthy weight loss, and a regular exercise program as the means to achieve improved blood pressure control. [name] agreed with this plan and agreed to follow up as directed. We will continue to monitor her blood pressure as well as her progress with the above lifestyle modifications. She will continue HCTZ and Verapamil as prescribed. She is to keep a BP log and if BP increases to 681E systolic or 90 diastolic, she is instructed to taker he prescribed irbesartan. She agrees to this plan and agrees to follow up with [name] as scheduled.  We spent > than 50% of the 15 minute visit on the counseling as documented in the note.  Obesity [name] is currently in the action stage of change. As such, her goal is to continue with weight loss efforts She has agreed to keep a food journal with 1200 calories and 85 grams of protein daily [name] has been instructed to work up to a goal of 150 minutes of combined cardio and strengthening exercise per week for weight loss and overall health benefits. We discussed the following Behavioral Modification Strategies today: increasing lean protein intake and planning for success  [name] has agreed to follow up with our clinic in 1 week. She was informed of the importance of frequent follow up visits to maximize her success with intensive lifestyle modifications for her multiple health conditions.    OBESITY BEHAVIORAL INTERVENTION VISIT  Today's visit was # 21 out of 22.  Starting weight: 280 lbs Starting date: 08/31/16 Today's weight : 254 lbs Today's date: 08/14/2017 Total lbs lost to date: 42 (Patients  must lose 7 lbs in the first 6 months to continue with counseling)   ASK: We discussed the diagnosis of obesity with [name] today and [name] agreed to give [name] permission to discuss obesity behavioral modification therapy today.  ASSESS: [name] has the diagnosis of obesity and her BMI today is 41.02 [name] is in the action stage of change   ADVISE: [name] was educated on the multiple health risks of obesity as well as the benefit of weight loss to improve her health. She was advised of the need for long term treatment and the importance of lifestyle modifications.  AGREE: Multiple dietary modification options and treatment options were discussed and  [name] agreed to the above obesity treatment plan.   [name], am acting as transcriptionist for Lacy Duverney, [facility] have reviewed this note and agree with its contents I have reviewed the above documentation for accuracy and completeness, and I agree with the above. -Dennard Nip, MD

## 2017-08-15 NOTE — ED Provider Notes (Signed)
[facility] DEPT Provider Note   CSN: [id number] Arrival date & time: 08/13/17  8101     History   Chief Complaint Chief Complaint  Patient presents with  . Angioedema    HPI [name] is a 72 y.o. female.  HPI   72 year old female with angioedema.  Patient was recently started on lisinopril.  She woke up this morning with swelling in her face/lips.  Symptoms have been stable since he first noticed him.  No difficulty breathing or swallowing.  No changes in her voice.  Past Medical History:  Diagnosis Date  . Anemia   . Anxiety    takes Xanax daily  . Arthritis   . Back pain   . Blood transfusion   . Breast cancer ([place]) 2013   left breast  . Bronchitis    hx of  . Chronic fatigue syndrome   . Depression    takes Lexapro daily  . Diabetes mellitus without complication ([place])    boderline  . Diverticulosis   . Edema    feet and legs  . Headache(784.0)   . History of colon polyps 1998   adenomatous  . Hyperlipidemia    takes Niacin daily  . Hypertension    takes Verapamil and Avapro daily  . Joint pain   . OSA (obstructive sleep apnea)   . Personal history of radiation therapy   . Pneumonia 4/12   Albuterol daily as needed  . Radiation 10/29/11-11/24/11   left breast 6100 cGy  . Shortness of breath    with exertion  . Spinal stenosis   . Tinnitus     Patient Active Problem List   Diagnosis Date Noted  . Hypertension 10/03/2016  . Prediabetes 10/03/2016  . Diastolic dysfunction 75/04/2584  . Morbid obesity ([place]) 08/31/2016  . Other fatigue 08/31/2016  . Shortness of breath on exertion 08/31/2016  . Vitamin D deficiency 08/31/2016  . Vitamin B 12 deficiency 08/31/2016  . B12 deficiency 07/20/2016  . Lumbar radiculitis 04/20/2015  . Spondylolisthesis, acquired 12/29/2014  . Spinal stenosis 08/13/2014  . Pituitary macroadenoma ([place]) 08/13/2014  . Hot flashes due to tamoxifen 12/11/2013  . Postmenopausal estrogen  deficiency 12/11/2013  . Breast cancer of upper-outer quadrant of left female breast ([place]) 06/12/2013  . Dyspnea 02/11/2013  . Lactic acidosis 02/11/2013  . Acute bronchitis 02/11/2013  . Class 3 obesity with serious comorbidity and body mass index (BMI) of 40.0 to 44.9 in adult 02/11/2013  . Breast calcifications on mammogram 01/29/2013  . Hyperparathyroidism, primary ([place]) 01/29/2013  . OSA (obstructive sleep apnea) 01/30/2011  . Hyperlipidemia 09/20/2007  . Essential hypertension 09/20/2007    Past Surgical History:  Procedure Laterality Date  . APPENDECTOMY  1978  . BACK SURGERY    . BELPHAROPTOSIS REPAIR    . BREAST EXCISIONAL BIOPSY Right 02/06/2013  . BREAST LUMPECTOMY  08/2011   left  . BREAST LUMPECTOMY WITH NEEDLE LOCALIZATION Right 02/06/2013   Procedure: RIGHT BREAST LUMPECTOMY WITH NEEDLE LOCALIZATION;  Surgeon: Harl Bowie, MD;  Location: [facility];  Service: General;  Laterality: Right;  . COLONOSCOPY W/ POLYPECTOMY    . COLONOSCOPY WITH PROPOFOL N/A 10/27/2015   Procedure: COLONOSCOPY WITH PROPOFOL;  Surgeon: Mauri Pole, MD;  Location: [facility];  Service: Endoscopy;  Laterality: N/A;  . HARDWARE REMOVAL Left 04/20/2015   Procedure: HARDWARE REMOVAL LEFT LUMBAR FIVE SCREW;  Surgeon: Karie Chimera, MD;  Location: [facility];  Service: Neurosurgery;  Laterality: Left;  . JOINT REPLACEMENT  Bilateral    bilateral knee  . POLYPECTOMY  1990's  . THYROID CYST EXCISION  1994  . TOTAL KNEE ARTHROPLASTY  2006   bilateral  . TRANSPHENOIDAL / TRANSNASAL HYPOPHYSECTOMY / RESECTION PITUITARY TUMOR  6/11    OB History    Gravida Para Term Preterm AB Living   '2 2 2     2   ' SAB TAB Ectopic Multiple Live Births           2       Home Medications    Prior to Admission medications   Medication Sig Start Date End Date Taking? Authorizing Provider  amitriptyline (ELAVIL) 25 MG tablet Take 25 mg by mouth every morning.    Yes [provider]  aspirin 81 MG  tablet Take 81 mg by mouth daily.   Yes [provider]  DUREZOL 0.05 % EMUL Place 1 drop into the left eye daily. 05/14/17  Yes [provider]  escitalopram (LEXAPRO) 10 MG tablet Take 10 mg by mouth daily.   Yes [provider]  fluticasone (CUTIVATE) 0.05 % cream Apply 1 application topically 2 (two) times daily. 07/12/17  Yes [provider]  hydrochlorothiazide (HYDRODIURIL) 25 MG tablet Take 1 tablet (25 mg total) by mouth daily. 07/18/17  Yes Alene Mires, Sahar M, PA-C  ketoconazole (NIZORAL) 2 % cream Apply 1 application topically daily. 06/01/16  Yes Magrinat, Virgie Dad, MD  metFORMIN (GLUCOPHAGE) 500 MG tablet take 1 tablet by mouth once daily with breakfast 08/01/17  Yes Alene Mires, Sahar M, PA-C  niacin 500 MG CR capsule Take 500 mg by mouth 2 (two) times daily with a meal.   Yes [provider]  Omega-3 Fatty Acids (FISH OIL) 1200 MG CAPS Take 500 mg by mouth daily.    Yes [provider]  tamoxifen (NOLVADEX) 20 MG tablet TAKE 1 TABLET BY MOUTH  DAILY 03/20/17  Yes Nicholas Lose, MD  verapamil (VERELAN PM) 240 MG 24 hr capsule Take 240 mg by mouth daily.    Yes [provider]  vitamin B-12 (CYANOCOBALAMIN) 1000 MCG tablet Take 1,000 mcg by mouth daily.   Yes [provider]  Vitamin D, Cholecalciferol, 400 UNITS TABS Take 400 Units by mouth at bedtime.   Yes [provider]  Vitamin D, Ergocalciferol, (DRISDOL) 50000 units CAPS capsule Take 1 capsule (50,000 Units total) by mouth every 7 (seven) days. 07/16/17  Yes Alene Mires, Sahar M, PA-C  blood glucose meter kit and supplies KIT Dispense based on patient and insurance preference. Use up to four times daily as directed. (FOR ICD-9 250.00, 250.01). 06/07/17   Waldon Merl, PA-C    Family History Family History  Problem Relation Age of Onset  . Heart disease Father   . Clotting disorder Father   . Stroke Father   . Hypertension Father   . Heart Problems Father   .  Stroke Mother   . Diabetes Mother   . Hypertension Mother   . Hyperlipidemia Mother   . Obesity Mother   . Cancer Sister 59       Breast Cancer  . Breast cancer Sister 77  . Colon cancer Neg Hx   . Neuropathy Neg Hx     Social History Social History   Tobacco Use  . Smoking status: Never Smoker  . Smokeless tobacco: Never Used  Substance Use Topics  . Alcohol use: No  . Drug use: No     Allergies   Cymbalta [duloxetine hcl];  Gabapentin; Lipitor [atorvastatin]; Lisinopril; Maxzide [hydrochlorothiazide w-triamterene]; Pravastatin sodium; Simvastatin; and Lodine [etodolac]   Review of Systems Review of Systems  All systems reviewed and negative, other than as noted in HPI.  Physical Exam Updated Vital Signs BP (!) 156/90   Pulse 80   Temp 98 F (36.7 C) (Oral)   Resp 16   SpO2 100%   Physical Exam  Constitutional: She appears well-developed and well-nourished. No distress.  HENT:  Head: Normocephalic.  Asymmetric swelling of the lips, upper greater than lower.  Some mild right facial puffiness.  Normal sounding voice.  Tongue is grossly normal in appearance.  Her soft palate is fully visible.  Eyes: Conjunctivae are normal. Right eye exhibits no discharge. Left eye exhibits no discharge.  Neck: Neck supple.  Cardiovascular: Normal rate, regular rhythm and normal heart sounds. Exam reveals no gallop and no friction rub.  No murmur heard. Pulmonary/Chest: Effort normal and breath sounds normal. No respiratory distress.  Abdominal: Soft. She exhibits no distension. There is no tenderness.  Musculoskeletal: She exhibits no edema or tenderness.  Neurological: She is alert.  Skin: Skin is warm and dry.  Psychiatric: She has a normal mood and affect. Her behavior is normal. Thought content normal.  Nursing note and vitals reviewed.    ED Treatments / Results  Labs (all labs ordered are listed, but only abnormal results are displayed) Labs Reviewed - No data to  display  EKG  EKG Interpretation None       Radiology No results found.  Procedures Procedures (including critical care time)  Medications Ordered in ED Medications  dexamethasone (DECADRON) tablet 8 mg (8 mg Oral Given 08/13/17 1234)  diphenhydrAMINE (BENADRYL) capsule 25 mg (25 mg Oral Given 08/13/17 1234)     Initial Impression / Assessment and Plan / ED Course  I have reviewed the triage vital signs and the nursing notes.  Pertinent labs & imaging results that were available during my care of the patient were reviewed by me and considered in my medical decision making (see chart for details).     72 year old female with angioedema likely precipitated by her ACE inhibitor.  She was advised to stop this until this is an allergy.  Her symptoms have been stable for several hours now.  No significant airway compromise on exam.  Return precautions were discussed.  She is to follow-up with her PCP to discuss alternative hypertension medications.  Final Clinical Impressions(s) / ED Diagnoses   Final diagnoses:  Angioedema, initial encounter    ED Discharge Orders    None       Virgel Manifold, MD 08/15/17 [phone]

## 2017-08-23 ENCOUNTER — Ambulatory Visit (INDEPENDENT_AMBULATORY_CARE_PROVIDER_SITE_OTHER): Payer: Medicare Other | Admitting: Physician Assistant

## 2017-08-23 VITALS — BP 152/79 | HR 82 | Temp 98.4°F | Ht 66.0 in | Wt 257.0 lb

## 2017-08-23 DIAGNOSIS — E559 Vitamin D deficiency, unspecified: Secondary | ICD-10-CM | POA: Diagnosis not present

## 2017-08-23 DIAGNOSIS — I10 Essential (primary) hypertension: Secondary | ICD-10-CM | POA: Diagnosis not present

## 2017-08-23 DIAGNOSIS — Z6841 Body Mass Index (BMI) 40.0 and over, adult: Secondary | ICD-10-CM

## 2017-08-23 DIAGNOSIS — R0789 Other chest pain: Secondary | ICD-10-CM | POA: Diagnosis not present

## 2017-08-23 DIAGNOSIS — D649 Anemia, unspecified: Secondary | ICD-10-CM

## 2017-08-23 DIAGNOSIS — R072 Precordial pain: Secondary | ICD-10-CM | POA: Insufficient documentation

## 2017-08-23 MED ORDER — IRBESARTAN 300 MG PO TABS
300.0000 mg | ORAL_TABLET | Freq: Every day | ORAL | 0 refills | Status: AC
Start: 2017-08-23 — End: ?

## 2017-08-23 MED ORDER — HYDROCHLOROTHIAZIDE 25 MG PO TABS: 25.0000 mg | ORAL_TABLET | Freq: Every day | ORAL | 0 refills | Status: DC

## 2017-08-23 MED ORDER — VITAMIN D (ERGOCALCIFEROL) 1.25 MG (50000 UNIT) PO CAPS: 50000.0000 [IU] | ORAL_CAPSULE | ORAL | 0 refills | Status: DC

## 2017-08-23 MED ORDER — FERROUS SULFATE 325 (65 FE) MG PO TABS
325.0000 mg | ORAL_TABLET | Freq: Every day | ORAL | 0 refills | Status: DC
Start: 2017-08-23 — End: 2017-10-11

## 2017-08-23 NOTE — Progress Notes (Signed)
On review of EKG, No acute ST changes noted. EKG is scanned into patient's chart. Patient is instructed that if chest pain recurrs, or if she develops any new symptoms such as dyspnea, palpitations, weakness, dizziness, nausea, vomiting, radiation of the pain to arm or neck, diaphoresis, or any new symptoms, to to the ER. She is instructed to follow up with her PCP for further evaluation and management of her chest pain.

## 2017-08-23 NOTE — Progress Notes (Signed)
Office: [phone]  /  Fax: [phone]   HPI:   Chief Complaint: OBESITY [name] is here to discuss her progress with her obesity treatment plan. She is on the keep a food journal with 1200 calories and 85 grams of protein daily and is following her eating plan approximately 80 % of the time. She states she is exercising 0 minutes 0 times per week. [name] has had increase celebration eating and has not been as mindful of her eating. She is motivated to get back on track and continue with weight loss.  Her weight is 257 lb (116.6 kg) today and has gained 3 pounds since her last visit. She has lost 23 lbs since starting treatment with [name].  Hypertension [name] is a 72 y.o. female with hypertension. [name]'s blood pressure is elevated. She has been off her irbesartan, as per her request due to the recall of ARBs. Pt has been switched to Lisinopril, but developed angioedema. She notes chest pain and denies shortness of breath. She is working weight loss to help control her blood pressure with the goal of decreasing her risk of heart attack and stroke. [name]'s blood pressure is not currently controlled.  Chest Pain [name] states she has felt chest pain on different occasions since yesterday. Pain is described as a sharp, and only lasts for a second. She states pain was relieved when she moved her arm and felt the pain was related to how she slept that night. She denies any current pain. Denies any radiation of the pain to the arm or jaw. Denies any dyspnea, palpitations, diaphoresis, nausea, or vomiting. Denies any current cough. Denies any previous episodes.   Vitamin D deficiency [name] has a diagnosis of vitamin D deficiency. She is currently taking prescription Vit D and denies nausea, vomiting or muscle weakness.  Anemia, Unspecified [name] has a diagnosis of anemia. Her Hgb is low at 11.1. She states she has had a history of iron deficiency anemia in the past. She has evidence of B12  deficiency in the past and has been on B12 supplements, however, due to elevation of her B12 on subsequent labs, we stopped B12 supplement. B12 obtain last within normal limits.  Denies any current bleeding, melena, BRBPR, or any hematochezia, hemoptysis. She denies any dizziness, dyspnea, jaundice, pallor. Does admit to an episode of chest pain that occurred last night. Denies any current chest pain. She declines any further investigative labs such as iron studies, stating she will do labs with her PCP in March.  ALLERGIES: Allergies  Allergen Reactions  . Cymbalta [Duloxetine Hcl] Other (See Comments)    Stomach pain  . Gabapentin Itching  . Lipitor [Atorvastatin] Other (See Comments)    Cramps  . Lisinopril Swelling  . Maxzide [Hydrochlorothiazide W-Triamterene] Other (See Comments)    Cramping   . Pravastatin Sodium Other (See Comments)    Headache   . Simvastatin Other (See Comments)    Memory loss  . Lodine [Etodolac] Itching    MEDICATIONS: Current Outpatient Medications on File Prior to Visit  Medication Sig Dispense Refill  . amitriptyline (ELAVIL) 25 MG tablet Take 25 mg by mouth every morning.     [name] aspirin 81 MG tablet Take 81 mg by mouth daily.    . blood glucose meter kit and supplies KIT Dispense based on patient and insurance preference. Use up to four times daily as directed. (FOR ICD-9 250.00, 250.01). 1 each 0  . DUREZOL 0.05 % EMUL Place 1 drop into  the left eye daily.  0  . escitalopram (LEXAPRO) 10 MG tablet Take 10 mg by mouth daily.    . fluticasone (CUTIVATE) 0.05 % cream Apply 1 application topically 2 (two) times daily.  0  . hydrochlorothiazide (HYDRODIURIL) 25 MG tablet Take 1 tablet (25 mg total) by mouth daily. 30 tablet 0  . ketoconazole (NIZORAL) 2 % cream Apply 1 application topically daily. 15 g 0  . metFORMIN (GLUCOPHAGE) 500 MG tablet take 1 tablet by mouth once daily with breakfast 30 tablet 0  . niacin 500 MG CR capsule Take 500 mg by mouth 2  (two) times daily with a meal.    . Omega-3 Fatty Acids (FISH OIL) 1200 MG CAPS Take 500 mg by mouth daily.     . tamoxifen (NOLVADEX) 20 MG tablet TAKE 1 TABLET BY MOUTH  DAILY 90 tablet 3  . verapamil (VERELAN PM) 240 MG 24 hr capsule Take 240 mg by mouth daily.     . Vitamin D, Cholecalciferol, 400 UNITS TABS Take 400 Units by mouth at bedtime.    . Vitamin D, Ergocalciferol, (DRISDOL) 50000 units CAPS capsule Take 1 capsule (50,000 Units total) by mouth every 7 (seven) days. 4 capsule 0   No current facility-administered medications on file prior to visit.     PAST MEDICAL HISTORY: Past Medical History:  Diagnosis Date  . Anemia   . Anxiety    takes Xanax daily  . Arthritis   . Back pain   . Blood transfusion   . Breast cancer ([place]) 2013   left breast  . Bronchitis    hx of  . Chronic fatigue syndrome   . Depression    takes Lexapro daily  . Diabetes mellitus without complication ([place])    boderline  . Diverticulosis   . Edema    feet and legs  . Headache(784.0)   . History of colon polyps 1998   adenomatous  . Hyperlipidemia    takes Niacin daily  . Hypertension    takes Verapamil and Avapro daily  . Joint pain   . OSA (obstructive sleep apnea)   . Personal history of radiation therapy   . Pneumonia 4/12   Albuterol daily as needed  . Radiation 10/29/11-11/24/11   left breast 6100 cGy  . Shortness of breath    with exertion  . Spinal stenosis   . Tinnitus     PAST SURGICAL HISTORY: Past Surgical History:  Procedure Laterality Date  . APPENDECTOMY  1978  . BACK SURGERY    . BELPHAROPTOSIS REPAIR    . BREAST EXCISIONAL BIOPSY Right 02/06/2013  . BREAST LUMPECTOMY  08/2011   left  . BREAST LUMPECTOMY WITH NEEDLE LOCALIZATION Right 02/06/2013   Procedure: RIGHT BREAST LUMPECTOMY WITH NEEDLE LOCALIZATION;  Surgeon: Harl Bowie, MD;  Location: [facility];  Service: General;  Laterality: Right;  . COLONOSCOPY W/ POLYPECTOMY    . COLONOSCOPY WITH PROPOFOL N/A  10/27/2015   Procedure: COLONOSCOPY WITH PROPOFOL;  Surgeon: Mauri Pole, MD;  Location: [facility];  Service: Endoscopy;  Laterality: N/A;  . HARDWARE REMOVAL Left 04/20/2015   Procedure: HARDWARE REMOVAL LEFT LUMBAR FIVE SCREW;  Surgeon: Karie Chimera, MD;  Location: [facility];  Service: Neurosurgery;  Laterality: Left;  . JOINT REPLACEMENT Bilateral    bilateral knee  . POLYPECTOMY  1990's  . THYROID CYST EXCISION  1994  . TOTAL KNEE ARTHROPLASTY  2006   bilateral  . TRANSPHENOIDAL / TRANSNASAL HYPOPHYSECTOMY / RESECTION PITUITARY TUMOR  6/11    SOCIAL HISTORY: Social History   Tobacco Use  . Smoking status: Never Smoker  . Smokeless tobacco: Never Used  Substance Use Topics  . Alcohol use: No  . Drug use: No    FAMILY HISTORY: Family History  Problem Relation Age of Onset  . Heart disease Father   . Clotting disorder Father   . Stroke Father   . Hypertension Father   . Heart Problems Father   . Stroke Mother   . Diabetes Mother   . Hypertension Mother   . Hyperlipidemia Mother   . Obesity Mother   . Cancer Sister 9       Breast Cancer  . Breast cancer Sister 32  . Colon cancer Neg Hx   . Neuropathy Neg Hx     ROS: Review of Systems  Constitutional: Negative for diaphoresis and weight loss.  Respiratory: Negative for cough and shortness of breath.   Cardiovascular: Positive for chest pain. Negative for palpitations.  Gastrointestinal: Negative for nausea and vomiting.  Musculoskeletal:       Negative muscle weakness    PHYSICAL EXAM: Blood pressure (!) 152/79, pulse 82, temperature 98.4 F (36.9 C), temperature source Oral, height '5\' 6"'  (1.676 m), weight 257 lb (116.6 kg), SpO2 99 %. Body mass index is 41.48 kg/m. Physical Exam  Constitutional: [name], place, and time. She appears well-developed and well-nourished.  Cardiovascular: Normal rate.  Pulmonary/Chest: Effort normal.  Musculoskeletal: Normal range of motion.    Neurological: [name], place, and time.  Skin: Skin is warm and dry.  Psychiatric: She has a normal mood and affect. Her behavior is normal.  Vitals reviewed.   RECENT LABS AND TESTS: BMET    Component Value Date/Time   NA 143 05/10/2017 1006   NA 140 06/01/2016 1143   K 4.2 05/10/2017 1006   K 4.0 06/01/2016 1143   CL 105 05/10/2017 1006   CL 105 12/09/2012 1423   CO2 24 05/10/2017 1006   CO2 26 06/01/2016 1143   GLUCOSE 90 05/10/2017 1006   GLUCOSE 139 (H) 08/21/2016 2027   GLUCOSE 121 06/01/2016 1143   GLUCOSE 177 (H) 12/09/2012 1423   BUN 22 05/10/2017 1006   BUN 12.5 06/01/2016 1143   CREATININE 1.30 (H) 05/10/2017 1006   CREATININE 1.1 06/01/2016 1143   CALCIUM 10.2 05/10/2017 1006   CALCIUM 10.1 06/01/2016 1143   GFRNONAA 41 (L) 05/10/2017 1006   GFRAA 47 (L) 05/10/2017 1006   Lab Results  Component Value Date   HGBA1C 6.8 (H) 05/10/2017   HGBA1C 6.2 (H) 12/14/2016   HGBA1C 6.3 (H) 08/31/2016   HGBA1C (H) 11/11/2010    6.7 (NOTE)                                                                       According to the ADA Clinical Practice Recommendations for 2011, when HbA1c is used as a screening test:   >=6.5%   Diagnostic of Diabetes Mellitus           (if abnormal result  is confirmed)  5.7-6.4%   Increased risk of developing Diabetes Mellitus  References:Diagnosis and Classification of Diabetes Mellitus,Diabetes ENID,7824,23(NTIRW 1):S62-S69 and Standards of  Medical Care in         Diabetes - 2011,Diabetes Care,2011,34  (Suppl 1):S11-S61.   Lab Results  Component Value Date   INSULIN 10.5 05/10/2017   INSULIN 20.9 12/14/2016   INSULIN 23.5 08/31/2016   CBC    Component Value Date/Time   WBC 5.7 05/10/2017 1006   WBC 14.5 (H) 08/21/2016 2027   RBC 4.28 05/10/2017 1006   RBC 4.46 08/21/2016 2027   HGB 11.0 (L) 05/10/2017 1006   HGB 11.1 (L) 06/01/2016 1143   HCT 34.4 05/10/2017 1006   HCT 34.9 06/01/2016 1143   PLT 267 08/21/2016  2027   PLT 248 06/01/2016 1143   MCV 80 05/10/2017 1006   MCV 81.2 06/01/2016 1143   MCH 25.7 (L) 05/10/2017 1006   MCH 26.5 08/21/2016 2027   MCHC 32.0 05/10/2017 1006   MCHC 33.1 08/21/2016 2027   RDW 16.5 (H) 05/10/2017 1006   RDW 15.8 (H) 06/01/2016 1143   LYMPHSABS 1.3 05/10/2017 1006   LYMPHSABS 1.8 06/01/2016 1143   MONOABS 0.6 06/01/2016 1143   EOSABS 0.1 05/10/2017 1006   BASOSABS 0.0 05/10/2017 1006   BASOSABS 0.0 06/01/2016 1143   Iron/TIBC/Ferritin/ %Sat    Component Value Date/Time   IRON 55 12/14/2016 1146   TIBC 329 12/14/2016 1146   FERRITIN 479 (H) 12/14/2016 1146   FERRITIN 362 (H) 04/15/2015 1058   IRONPCTSAT 17 12/14/2016 1146   Lipid Panel     Component Value Date/Time   CHOL 212 (H) 05/10/2017 1006   TRIG 116 05/10/2017 1006   HDL 44 05/10/2017 1006   CHOLHDL 6.4 11/11/2010 0555   VLDL 17 11/11/2010 0555   LDLCALC 145 (H) 05/10/2017 1006   Hepatic Function Panel     Component Value Date/Time   PROT 6.9 05/10/2017 1006   PROT 7.6 06/01/2016 1143   ALBUMIN 3.9 05/10/2017 1006   ALBUMIN 3.2 (L) 06/01/2016 1143   AST 12 05/10/2017 1006   AST 11 06/01/2016 1143   ALT 10 05/10/2017 1006   ALT 12 06/01/2016 1143   ALKPHOS 51 05/10/2017 1006   ALKPHOS 69 06/01/2016 1143   BILITOT 0.3 05/10/2017 1006   BILITOT 0.28 06/01/2016 1143      Component Value Date/Time   TSH 1.710 08/31/2016 1000   TSH 1.570 01/20/2016 1634   TSH 2.028 Test methodology is 3rd generation TSH 11/27/2009 1004  Results for NIASHA, DEVINS (MRN [id number]) as of 08/23/2017 12:17  Ref. Range 05/10/2017 10:06  Vitamin D, 25-Hydroxy Latest Ref Range: 30.0 - 100.0 ng/mL 39.3    ASSESSMENT AND PLAN: Other chest pain - Plan: EKG 12-Lead  Essential hypertension - Plan: hydrochlorothiazide (HYDRODIURIL) 25 MG tablet, verapamil (CALAN-SR) 120 MG CR tablet  Vitamin D deficiency - Plan: Vitamin D, Ergocalciferol, (DRISDOL) 50000 units CAPS capsule  Anemia, unspecified type -  Plan: ferrous sulfate 325 (65 FE) MG tablet  Class 3 severe obesity with serious comorbidity and body mass index (BMI) of 40.0 to 44.9 in adult, unspecified obesity type (HCC)  PLAN:  Hypertension [name] is instructed, due to her continued elevated BP readings, and due to her intolerance to other BP meds (see above), she is instructed to go back to taking irbesartan 300 mg daily. Pt states she has medicine at home, and declines any refills today. She is to continue taking Verapamil 240 mg once daily. We discussed sodium restriction, working on healthy weight loss, and a regular exercise program as the means to achieve improved blood pressure control. [name]  agreed with this plan and agreed to follow up as directed. We will continue to monitor her blood pressure as well as her progress with the above lifestyle modifications. She will continue her medications as prescribed and is instructed to record her BP reading at home on a log and bring in for review. She is instructed to watch for signs of hypotension as she continues her lifestyle modifications.  She is instructed to follow up with her PCP for further management of her HTN.   Chest Pain  Will obtain an EKG in the clinic for further evaluation of her chest pain.  Vitamin D Deficiency [name] was informed that low vitamin D levels contributes to fatigue and are associated with obesity, breast, and colon cancer. [name] agrees to continue to take prescription Vit D '@50' ,000 IU every week #4 and we will refill for 1 month. She will follow up for routine testing of vitamin D, at least 2-3 times per year. She was informed of the risk of over-replacement of vitamin D and agrees to not increase her dose unless she discusses this with [name] first. [name] agrees to follow up with our clinic in 2 weeks.  Anemia, Unspecified The diagnosis of anemia was discussed with [name] and was explained in detail. She was given suggestions of iron rich foods. [name] agrees to start  ferrous sulfate 325 mg qd #30 with no refills. [name] agrees to follow up with our clinic in 2 weeks.    Obesity [name] is currently in the action stage of change. As such, her goal is to continue with weight loss efforts She has agreed to keep a food journal with 1200 calories and 85 grams of protein daily [name] has been instructed to work up to a goal of 150 minutes of combined cardio and strengthening exercise per week for weight loss and overall health benefits. We discussed the following Behavioral Modification Strategies today: increasing lean protein intake and emotional eating strategies   [name] has agreed to follow up with our clinic in 2 weeks. She was informed of the importance of frequent follow up visits to maximize her success with intensive lifestyle modifications for her multiple health conditions.   OBESITY BEHAVIORAL INTERVENTION VISIT  Today's visit was # 22 out of 22.  Starting weight: 280 lbs Starting date: 08/31/16 Today's weight : 257 lbs  Today's date: 08/23/2017 Total lbs lost to date: 23 (Patients must lose 7 lbs in the first 6 months to continue with counseling)   ASK: We discussed the diagnosis of obesity with [name] today and [name] agreed to give [name] permission to discuss obesity behavioral modification therapy today.  ASSESS: [name] has the diagnosis of obesity and her BMI today is 41.5 [name] is in the action stage of change   ADVISE: [name] was educated on the multiple health risks of obesity as well as the benefit of weight loss to improve her health. She was advised of the need for long term treatment and the importance of lifestyle modifications.  AGREE: Multiple dietary modification options and treatment options were discussed and  Deija agreed to the above obesity treatment plan.   Wilhemena Durie, am acting as transcriptionist for Lacy Duverney, PA-C I, Lacy Duverney [facility], have reviewed this note and agree with its contents.

## 2017-08-28 DIAGNOSIS — H209 Unspecified iridocyclitis: Secondary | ICD-10-CM | POA: Diagnosis not present

## 2017-08-29 ENCOUNTER — Other Ambulatory Visit: Payer: Self-pay | Admitting: *Deleted

## 2017-08-29 DIAGNOSIS — C50412 Malignant neoplasm of upper-outer quadrant of left female breast: Secondary | ICD-10-CM

## 2017-08-30 ENCOUNTER — Inpatient Hospital Stay: Payer: Medicare Other | Attending: Oncology

## 2017-08-30 ENCOUNTER — Other Ambulatory Visit: Payer: Self-pay | Admitting: Family Medicine

## 2017-08-30 ENCOUNTER — Ambulatory Visit
Admission: RE | Admit: 2017-08-30 | Discharge: 2017-08-30 | Disposition: A | Discharge status: Home / Self Care | Payer: Medicare Other | Source: Ambulatory Visit | Attending: Family Medicine | Admitting: Family Medicine

## 2017-08-30 DIAGNOSIS — Z170 Estrogen receptor positive status [ER+]: Secondary | ICD-10-CM | POA: Insufficient documentation

## 2017-08-30 DIAGNOSIS — H209 Unspecified iridocyclitis: Secondary | ICD-10-CM | POA: Diagnosis not present

## 2017-08-30 DIAGNOSIS — C50412 Malignant neoplasm of upper-outer quadrant of left female breast: Secondary | ICD-10-CM | POA: Diagnosis not present

## 2017-08-30 DIAGNOSIS — Z923 Personal history of irradiation: Secondary | ICD-10-CM | POA: Diagnosis not present

## 2017-08-30 DIAGNOSIS — Z79810 Long term (current) use of selective estrogen receptor modulators (SERMs): Secondary | ICD-10-CM | POA: Insufficient documentation

## 2017-08-30 LAB — CBC WITH DIFFERENTIAL (CANCER CENTER ONLY)
Basophils Absolute: 0 10*3/uL (ref 0.0–0.1)
Basophils Relative: 1 %
EOS PCT: 2 %
Eosinophils Absolute: 0.1 10*3/uL (ref 0.0–0.5)
HEMATOCRIT: 32.7 % — AB (ref 34.8–46.6)
Hemoglobin: 10.7 g/dL — ABNORMAL LOW (ref 11.6–15.9)
LYMPHS PCT: 26 %
Lymphs Abs: 1.8 10*3/uL (ref 0.9–3.3)
MCH: 26.3 pg (ref 25.1–34.0)
MCHC: 32.6 g/dL (ref 31.5–36.0)
MCV: 80.7 fL (ref 79.5–101.0)
MONO ABS: 0.5 10*3/uL (ref 0.1–0.9)
MONOS PCT: 7 %
NEUTROS ABS: 4.5 10*3/uL (ref 1.5–6.5)
Neutrophils Relative %: 64 %
PLATELETS: 243 10*3/uL (ref 145–400)
RBC: 4.05 MIL/uL (ref 3.70–5.45)
RDW: 15.8 % — AB (ref 11.2–14.5)
WBC Count: 7.1 10*3/uL (ref 3.9–10.3)

## 2017-08-30 LAB — CMP (CANCER CENTER ONLY)
ALT: 9 U/L (ref 0–55)
AST: 10 U/L (ref 5–34)
Albumin: 3.3 g/dL — ABNORMAL LOW (ref 3.5–5.0)
Alkaline Phosphatase: 52 U/L (ref 40–150)
Anion gap: 10 (ref 3–11)
BILIRUBIN TOTAL: 0.4 mg/dL (ref 0.2–1.2)
BUN: 20 mg/dL (ref 7–26)
CHLORIDE: 106 mmol/L (ref 98–109)
CO2: 24 mmol/L (ref 22–29)
CREATININE: 1.17 mg/dL — AB (ref 0.60–1.10)
Calcium: 9.9 mg/dL (ref 8.4–10.4)
GFR, EST AFRICAN AMERICAN: 52 mL/min — AB (ref >60)
GFR, EST NON AFRICAN AMERICAN: 45 mL/min — AB (ref >60)
Glucose, Bld: 126 mg/dL (ref 70–140)
POTASSIUM: 3.7 mmol/L (ref 3.5–5.1)
Sodium: 140 mmol/L (ref 136–145)
TOTAL PROTEIN: 7 g/dL (ref 6.4–8.3)

## 2017-09-01 ENCOUNTER — Other Ambulatory Visit (INDEPENDENT_AMBULATORY_CARE_PROVIDER_SITE_OTHER): Payer: Self-pay | Admitting: Physician Assistant

## 2017-09-01 DIAGNOSIS — Z794 Long term (current) use of insulin: Principal | ICD-10-CM

## 2017-09-01 DIAGNOSIS — E1129 Type 2 diabetes mellitus with other diabetic kidney complication: Secondary | ICD-10-CM

## 2017-09-05 NOTE — Progress Notes (Signed)
ID: [name]   DOB: 09/28/1945  MR#: [id number]  PPI#:[id number]  PCP: Shirline Frees, MD GYN: Gus Height, MD SU: Coralie Keens, MD OTHER MD: Gery Pray, MD; Everlean Cherry  CHIEF COMPLAINT:  Hx of Left Breast Cancer  CURRENT TREATMENT: Completing 5 years of tamoxifen  BREAST CANCER HISTORY: From the original intake note:  [name] had routine screening mammography December 2012 showing a possible mass in the left breast. This was confirmed with left diagnostic mammography 07/13/2011, with an ultrasound that day showing a hypoechoic mass measuring 6 mm in the upper outer quadrant of the left breast. Biopsy of this mass 08/02/2011 showed (SAA 13-19) and invasive ductal carcinoma, which was estrogen receptor 100% positive, progesterone receptor 100% positive, with an MIB-1 of 6%, and no HER-2 amplification.  Breast MRI 08/08/2011 showed a solitary 1.2 cm mass in the upper outer quadrant of the left breast, and the patient underwent definitive left lumpectomy 08/30/2011 for a 1.2 cm invasive ductal carcinoma, grade 1, with all 3 sentinel lymph nodes clear. That he was evaluated by Dr. Eston Esters, and after her radiation treatments was completed he tried her on a rim index initially, then letrozole. The patient was unable to tolerate those medications.   The patient's subsequent history is as detailed below  INTERVAL HISTORY: [name] returns today for follow-up of her estrogen receptor positive breast cancer. She continues on tamoxifen, with good tolerance. She is still having frequent hot flashes. She also experiences a small increase in vaginal discharge.   Since her last visit, she underwent diagnostic bilateral mammography with CAD and tomography on 08/10/2017 at [facility] showing: breast density category B. No evidence of recurrent or new breast malignancy. Benign bilateral postsurgical changes. Benign right axillary sebaceous cyst.   She also completed a chest X-ray on  08/30/2017 showing no active cardiopulmonary disease.    REVIEW OF SYSTEMS: [name] reports that she had bilateral cataract removal. She notes that the surgery did not help her vision. She notes that her vision is not clear and she is unable to read from a far distance. She notes that she is still taking steroids. She notes that she also had laser surgery on the left eye. She notes that Dr. Leafy Ro is following up on her obesity and high cholesterol, and she is excited to report that she lost 30 lbs. She notes that her is not doing anything for exercise, but she plans on ordering a stationary bike for her house She reports that she had back surgery last year. She notes that her knee do not bother her. . She notes that she had a kidney stone last year. She denies unusual headaches, visual changes, nausea, vomiting, or dizziness. There has been no unusual cough, phlegm production, or pleurisy. This been no change in bowel or bladder habits. She denies unexplained fatigue or unexplained weight loss, bleeding, rash, or fever. A detailed review of systems was otherwise stable.    PAST MEDICAL HISTORY: Past Medical History:  Diagnosis Date  . Anemia   . Anxiety    takes Xanax daily  . Arthritis   . Back pain   . Blood transfusion   . Breast cancer ([place]) 2013   left breast  . Bronchitis    hx of  . Chronic fatigue syndrome   . Depression    takes Lexapro daily  . Diabetes mellitus without complication ([place])    boderline  . Diverticulosis   . Edema    feet and legs  .  Headache(784.0)   . History of colon polyps 1998   adenomatous  . Hyperlipidemia    takes Niacin daily  . Hypertension    takes Verapamil and Avapro daily  . Joint pain   . OSA (obstructive sleep apnea)   . Personal history of radiation therapy   . Pneumonia 4/12   Albuterol daily as needed  . Radiation 10/29/11-11/24/11   left breast 6100 cGy  . Shortness of breath    with exertion  . Spinal stenosis   . Tinnitus      PAST SURGICAL HISTORY: Past Surgical History:  Procedure Laterality Date  . APPENDECTOMY  1978  . BACK SURGERY    . BELPHAROPTOSIS REPAIR    . BREAST EXCISIONAL BIOPSY Right 02/06/2013  . BREAST LUMPECTOMY  08/2011   left  . BREAST LUMPECTOMY WITH NEEDLE LOCALIZATION Right 02/06/2013   Procedure: RIGHT BREAST LUMPECTOMY WITH NEEDLE LOCALIZATION;  Surgeon: Harl Bowie, MD;  Location: [facility];  Service: General;  Laterality: Right;  . COLONOSCOPY W/ POLYPECTOMY    . COLONOSCOPY WITH PROPOFOL N/A 10/27/2015   Procedure: COLONOSCOPY WITH PROPOFOL;  Surgeon: Mauri Pole, MD;  Location: [facility];  Service: Endoscopy;  Laterality: N/A;  . HARDWARE REMOVAL Left 04/20/2015   Procedure: HARDWARE REMOVAL LEFT LUMBAR FIVE SCREW;  Surgeon: Karie Chimera, MD;  Location: [facility];  Service: Neurosurgery;  Laterality: Left;  . JOINT REPLACEMENT Bilateral    bilateral knee  . POLYPECTOMY  1990's  . THYROID CYST EXCISION  1994  . TOTAL KNEE ARTHROPLASTY  2006   bilateral  . TRANSPHENOIDAL / TRANSNASAL HYPOPHYSECTOMY / RESECTION PITUITARY TUMOR  6/11    FAMILY HISTORY Family History  Problem Relation Age of Onset  . Heart disease Father   . Clotting disorder Father   . Stroke Father   . Hypertension Father   . Heart Problems Father   . Stroke Mother   . Diabetes Mother   . Hypertension Mother   . Hyperlipidemia Mother   . Obesity Mother   . Cancer Sister 73       Breast Cancer  . Breast cancer Sister 33  . Colon cancer Neg Hx   . Neuropathy Neg Hx   allergies [name] is allergic she says to an aunt 2 gabapentin acute looking for the allergies a and I can find The patient's father died at the age of 34 from a ruptured aneurysm (the patient is not sure if this was cerebral or thoracic). The patient's mother died at the age of 32 from a stroke. The patient had one brother and one sister. Her sister has recently been diagnosed with breast cancer at the age of 18. There is no  other history of breast or ovarian cancer in the family.  GYNECOLOGIC HISTORY:  (reviewed 12/11/2013) Menarche age 17, first live birth age 71, the patient is GX P2, change of life at age 67. She never took hormone replacement.  SOCIAL HISTORY:  (updated 12/11/2013) She is retired from a clerical work Designer, jewellery. She has been married 85 years. Her husband, Shanon Brow, owned his own [employer] and is retired. Daughter Coralyn Mark works for [employer] here in [place]. Son Jenetta Loges works as a Designer, industrial/product in [employer]. The patient has 3 grandchildren and 1 great granddaughter. She attends a local [place]: Not on file  HEALTH MAINTENANCE:  (updated 12/11/2013) Social History   Tobacco Use  . Smoking status: Never Smoker  . Smokeless tobacco: Never Used  Substance Use Topics  . Alcohol use: No  . Drug use: No     Colonoscopy: September 2006, normal (to be repeated in 2016) /[facility]   PAP:  January 2015/[facility]  Bone density: Her last bone density scan on 09/28/2011 showed a T score of 2.0 (normal).  Lipid panel: Per Dr. Kenton Kingfisher  Allergies  Allergen Reactions  . Cymbalta [Duloxetine Hcl] Other (See Comments)    Stomach pain  . Gabapentin Itching  . Lipitor [Atorvastatin] Other (See Comments)    Cramps  . Lisinopril Swelling  . Maxzide [Hydrochlorothiazide W-Triamterene] Other (See Comments)    Cramping   . Pravastatin Sodium Other (See Comments)    Headache   . Simvastatin Other (See Comments)    Memory loss  . Lodine [Etodolac] Itching    Current Outpatient Medications  Medication Sig Dispense Refill  . amitriptyline (ELAVIL) 25 MG tablet Take 25 mg by mouth every morning.     [name] aspirin 81 MG tablet Take 81 mg by mouth daily.    . blood glucose meter kit and supplies KIT Dispense based on patient and insurance preference. Use up to four times daily as directed. (FOR ICD-9 250.00, 250.01). 1 each 0  . DUREZOL 0.05 % EMUL Place 1 drop into the left eye daily.   0  . escitalopram (LEXAPRO) 10 MG tablet Take 10 mg by mouth daily.    . ferrous sulfate 325 (65 FE) MG tablet Take 1 tablet (325 mg total) by mouth daily with breakfast. 30 tablet 0  . fluticasone (CUTIVATE) 0.05 % cream Apply 1 application topically 2 (two) times daily.  0  . hydrochlorothiazide (HYDRODIURIL) 25 MG tablet Take 1 tablet (25 mg total) by mouth daily. 30 tablet 0  . irbesartan (AVAPRO) 300 MG tablet Take 1 tablet (300 mg total) by mouth at bedtime. 30 tablet 0  . ketoconazole (NIZORAL) 2 % cream Apply 1 application topically daily. 15 g 0  . metFORMIN (GLUCOPHAGE) 500 MG tablet take 1 tablet by mouth once daily with BREAKFAST 30 tablet 0  . niacin 500 MG CR capsule Take 500 mg by mouth 2 (two) times daily with a meal.    . Omega-3 Fatty Acids (FISH OIL) 1200 MG CAPS Take 500 mg by mouth daily.     . tamoxifen (NOLVADEX) 20 MG tablet TAKE 1 TABLET BY MOUTH  DAILY 90 tablet 3  . verapamil (VERELAN PM) 240 MG 24 hr capsule Take 240 mg by mouth daily.     . Vitamin D, Cholecalciferol, 400 UNITS TABS Take 400 Units by mouth at bedtime.    . Vitamin D, Ergocalciferol, (DRISDOL) 50000 units CAPS capsule Take 1 capsule (50,000 Units total) by mouth every 7 (seven) days. 4 capsule 0   No current facility-administered medications for this visit.     OBJECTIVE: Morbidly obese African-American woman in no acute distress  Vitals:   09/06/17 0851  BP: 125/76  Pulse: 100  Resp: 18  Temp: 99 F (37.2 C)  SpO2: 99%    Body mass index is 42.82 kg/m.    ECOG FS: 2 Filed Weights   09/06/17 0851  Weight: 265 lb 4.8 oz (120.3 kg)   Sclerae unicteric, EOMs intact No cervical or supraclavicular adenopathy Lungs no rales or rhonchi Heart regular rate and rhythm Abd soft, nontender, positive bowel sounds MSK no focal spinal tenderness, no upper extremity lymphedema Neuro: nonfocal, well oriented, appropriate affect Breasts: The right breast is has undergone lumpectomy followed by  radiation.  There is no evidence of local recurrence.  Both axillae are benign.  The left breast is benign.  LAB RESULTS:   Lab Results  Component Value Date   WBC 7.1 08/30/2017   NEUTROABS 4.5 08/30/2017   HGB 11.0 (L) 05/10/2017   HCT 32.7 (L) 08/30/2017   MCV 80.7 08/30/2017   PLT 243 08/30/2017      Chemistry      Component Value Date/Time   NA 140 08/30/2017 0736   NA 143 05/10/2017 1006   NA 140 06/01/2016 1143   K 3.7 08/30/2017 0736   K 4.0 06/01/2016 1143   CL 106 08/30/2017 0736   CL 105 12/09/2012 1423   CO2 24 08/30/2017 0736   CO2 26 06/01/2016 1143   BUN 20 08/30/2017 0736   BUN 22 05/10/2017 1006   BUN 12.5 06/01/2016 1143   CREATININE 1.17 (H) 08/30/2017 0736   CREATININE 1.1 06/01/2016 1143      Component Value Date/Time   CALCIUM 9.9 08/30/2017 0736   CALCIUM 10.1 06/01/2016 1143   ALKPHOS 52 08/30/2017 0736   ALKPHOS 69 06/01/2016 1143   AST 10 08/30/2017 0736   AST 11 06/01/2016 1143   ALT 9 08/30/2017 0736   ALT 12 06/01/2016 1143   BILITOT 0.4 08/30/2017 0736   BILITOT 0.28 06/01/2016 1143       STUDIES: Dg Chest 2 View  Result Date: 08/30/2017 CLINICAL DATA:  Uveitis. EXAM: CHEST  2 VIEW COMPARISON:  07/10/2014. FINDINGS: Mediastinum and hilar structures normal. Lungs are clear. No pleural effusion or pneumothorax. Heart size normal. Thoracic spine scoliosis and degenerative change. Prior lumbar spine fusion. IMPRESSION: No active cardiopulmonary disease. Electronically Signed   By: Marcello Moores  Register   On: 08/30/2017 09:24   Mm Diag Breast Tomo Bilateral  Result Date: 08/10/2017 CLINICAL DATA:  History of a left lumpectomy performed in 2013.Patient describes some intermittent left nipple region itching. She has been seen past by a dermatologist treated with a cream, but still experiences intermittent itching. She also has a small skin lesion along the inferior right axilla which she describes as intermittently swelling and draining  consistent with a sebaceous cyst. EXAM: 2D DIGITAL DIAGNOSTIC BILATERAL MAMMOGRAM WITH CAD AND ADJUNCT TOMO COMPARISON:  Previous exam(s). ACR Breast Density Category b: There are scattered areas of fibroglandular density. FINDINGS: There is a masslike opacity in the posterolateral left breast reflecting the lumpectomy site that is unchanged from prior studies. There is associated postsurgical architectural distortion. There is architectural distortion in the posterior, slightly medial right breast reflecting a previous benign excision. There are no new or suspicious masses, no other areas of architectural distortion and no suspicious calcifications. There is a small superficial mass with dystrophic appearing calcifications centrally in the right axilla. On exam, this corresponds to a small sebaceous cyst. Mammographic images were processed with CAD. IMPRESSION: 1. No evidence of recurrent or new breast malignancy. 2. Benign bilateral postsurgical changes. Benign right axillary sebaceous cyst. RECOMMENDATION: Diagnostic mammography in 1 year per standard post lumpectomy protocol. I have discussed the findings and recommendations with the patient. Results were also provided in writing at the conclusion of the visit. If applicable, a reminder letter will be sent to the patient regarding the next appointment. BI-RADS CATEGORY  2: Benign. Electronically Signed   By: Lajean Manes M.D.   On: 08/10/2017 11:58     ASSESSMENT: 72 y.o. [place] woman   (1)  status post left breast upper outer quadrant  lumpectomy and sentinel  lymph node sampling 08/30/2011 for a pT1c pN0, stage IA invasive ductal carcinoma, grade 1, estrogen and progesterone receptor positive, both at 100%, with an MIB-1 of 6%, and no HER-2 amplification.  (2) Oncotype DX score of 3 predicted a distant recurrence within 10 years of 3% if the patient's only adjuvant treatment was tamoxifen for 5 years  (3) Adjuvant! Online would quote her a risk  of recurrence of 16% (including in breast recurrences) dropping to 8% with 5 years of an aromatase inhibitor  (4) Completed adjuvant radiation treatments 11/24/2011  (5)  The patient was unable to take letrozole or anastrozole secondary to concerns regarding jaw pain and constipation.  (5)  Tamoxifen started 10/11/2012, completing 5 years February 2019  (6) Persistent moderate elevation of the blood calcium level; with elevated  PTH (insurance refused to approve parathyroidectomy).   (7) benign right breast lumpectomy 02/06/2013 for suspicious calcifications  PLAN:  [name] is now 6 years out from definitive surgery for her breast cancer with no evidence of disease recurrence.  This is very favorable.  She has completed 5 years of tamoxifen.  While in some cases, especially node positive once, continuing tamoxifen for an additional 5 years provides a small measure of additional risk reduction, I would not expected in her case, since her predicted risk of recurrence with 5 years of tamoxifen was 3%.  Accordingly she is stopping tamoxifen at this point.  I feel comfortable releasing her to her primary care physician.  All she will need in terms of breast cancer follow-up is yearly mammography and a yearly physician breast  I will be glad to see [name] at any point in the future if and when the need arises but as of now are making no further routine appointment for her here.  Magrinat, Virgie Dad, MD  09/06/17 9:12 AM Medical Oncology and Hematology [facility] [address] [place], [place] [address] Tel. [phone]    Fax. [phone]  This document serves as a record of services personally performed by Lurline Del, MD. It was created on his behalf by Sheron Nightingale, a trained medical scribe. The creation of this record is based on the scribe's personal observations and the provider's statements to them.   I have reviewed the above documentation for accuracy and  completeness, and I agree with the above.

## 2017-09-06 ENCOUNTER — Encounter: Payer: Self-pay | Admitting: Oncology

## 2017-09-06 ENCOUNTER — Inpatient Hospital Stay: Payer: Medicare Other | Attending: Oncology | Admitting: Oncology

## 2017-09-06 VITALS — BP 125/76 | HR 100 | Temp 99.0°F | Resp 18 | Ht 66.0 in | Wt 265.3 lb

## 2017-09-06 DIAGNOSIS — E785 Hyperlipidemia, unspecified: Secondary | ICD-10-CM | POA: Diagnosis not present

## 2017-09-06 DIAGNOSIS — Z86010 Personal history of colonic polyps: Secondary | ICD-10-CM | POA: Diagnosis not present

## 2017-09-06 DIAGNOSIS — Z6841 Body Mass Index (BMI) 40.0 and over, adult: Secondary | ICD-10-CM

## 2017-09-06 DIAGNOSIS — C50412 Malignant neoplasm of upper-outer quadrant of left female breast: Secondary | ICD-10-CM | POA: Insufficient documentation

## 2017-09-06 DIAGNOSIS — Z79810 Long term (current) use of selective estrogen receptor modulators (SERMs): Secondary | ICD-10-CM | POA: Insufficient documentation

## 2017-09-06 DIAGNOSIS — Z170 Estrogen receptor positive status [ER+]: Secondary | ICD-10-CM | POA: Diagnosis not present

## 2017-09-06 DIAGNOSIS — E119 Type 2 diabetes mellitus without complications: Secondary | ICD-10-CM | POA: Insufficient documentation

## 2017-09-06 DIAGNOSIS — F419 Anxiety disorder, unspecified: Secondary | ICD-10-CM | POA: Insufficient documentation

## 2017-09-06 DIAGNOSIS — Z7984 Long term (current) use of oral hypoglycemic drugs: Secondary | ICD-10-CM | POA: Diagnosis not present

## 2017-09-06 DIAGNOSIS — F329 Major depressive disorder, single episode, unspecified: Secondary | ICD-10-CM | POA: Diagnosis not present

## 2017-09-06 DIAGNOSIS — Z923 Personal history of irradiation: Secondary | ICD-10-CM | POA: Insufficient documentation

## 2017-09-06 DIAGNOSIS — G4733 Obstructive sleep apnea (adult) (pediatric): Secondary | ICD-10-CM | POA: Diagnosis not present

## 2017-09-06 DIAGNOSIS — Z7982 Long term (current) use of aspirin: Secondary | ICD-10-CM | POA: Insufficient documentation

## 2017-09-06 DIAGNOSIS — Z79899 Other long term (current) drug therapy: Secondary | ICD-10-CM | POA: Insufficient documentation

## 2017-09-06 DIAGNOSIS — I10 Essential (primary) hypertension: Secondary | ICD-10-CM | POA: Insufficient documentation

## 2017-09-06 DIAGNOSIS — E210 Primary hyperparathyroidism: Secondary | ICD-10-CM

## 2017-09-08 ENCOUNTER — Other Ambulatory Visit (INDEPENDENT_AMBULATORY_CARE_PROVIDER_SITE_OTHER): Payer: Self-pay | Admitting: Physician Assistant

## 2017-09-08 DIAGNOSIS — I10 Essential (primary) hypertension: Secondary | ICD-10-CM

## 2017-09-13 ENCOUNTER — Ambulatory Visit (INDEPENDENT_AMBULATORY_CARE_PROVIDER_SITE_OTHER): Payer: Medicare Other | Admitting: Physician Assistant

## 2017-09-13 VITALS — BP 147/76 | HR 78 | Temp 97.8°F | Ht 66.0 in | Wt 255.0 lb

## 2017-09-13 DIAGNOSIS — Z6841 Body Mass Index (BMI) 40.0 and over, adult: Secondary | ICD-10-CM | POA: Diagnosis not present

## 2017-09-13 DIAGNOSIS — Z9189 Other specified personal risk factors, not elsewhere classified: Secondary | ICD-10-CM | POA: Diagnosis not present

## 2017-09-13 DIAGNOSIS — I10 Essential (primary) hypertension: Secondary | ICD-10-CM | POA: Diagnosis not present

## 2017-09-13 DIAGNOSIS — E559 Vitamin D deficiency, unspecified: Secondary | ICD-10-CM

## 2017-09-13 MED ORDER — HYDROCHLOROTHIAZIDE 25 MG PO TABS: 25.0000 mg | ORAL_TABLET | Freq: Every day | ORAL | 0 refills | Status: DC

## 2017-09-13 NOTE — Progress Notes (Signed)
Office: [phone]  /  Fax: [phone]   HPI:   Chief Complaint: OBESITY [name] is here to discuss her progress with her obesity treatment plan. She is on the  keep a food journal with 1200 calories and 85 grams of  protein  and is following her eating plan approximately 80 % of the time. She states she is not exercising  April continues to do well with weight loss. She is very mindful of her eating, but does not journal or follow a structured meal plan.  Her weight is 255 lb (115.7 kg) today and has had a weight loss of 2 pounds over a period of 3 weeks since her last visit. She has lost 25 lbs since starting treatment with [name].  Hypertension [name] is a 72 y.o. female with hypertension.  [name] denies chest pain or shortness of breath on exertion. She is working weight loss to help control her blood pressure with the goal of decreasing her risk of heart attack and stroke. [name]'s blood pressure is not currently controlled and states she has been out of her HCTZ for one week.   Vitamin D deficiency [name] has a diagnosis of vitamin D deficiency. She is currently taking vit D and denies nausea, vomiting or muscle weakness.   Ref. Range 05/10/2017 10:06  Vitamin D, 25-Hydroxy Latest Ref Range: 30.0 - 100.0 ng/mL 39.3    At risk for cardiovascular disease [name] is at a higher than average risk for cardiovascular disease due to obesity. She currently denies any chest pain.  ALLERGIES: Allergies  Allergen Reactions  . Cymbalta [Duloxetine Hcl] Other (See Comments)    Stomach pain  . Gabapentin Itching  . Lipitor [Atorvastatin] Other (See Comments)    Cramps  . Lisinopril Swelling  . Maxzide [Hydrochlorothiazide W-Triamterene] Other (See Comments)    Cramping   . Pravastatin Sodium Other (See Comments)    Headache   . Simvastatin Other (See Comments)    Memory loss  . Lodine [Etodolac] Itching    MEDICATIONS: Current Outpatient Medications on File Prior  to Visit  Medication Sig Dispense Refill  . amitriptyline (ELAVIL) 25 MG tablet Take 25 mg by mouth every morning.     [name] aspirin 81 MG tablet Take 81 mg by mouth daily.    . blood glucose meter kit and supplies KIT Dispense based on patient and insurance preference. Use up to four times daily as directed. (FOR ICD-9 250.00, 250.01). 1 each 0  . DUREZOL 0.05 % EMUL Place 1 drop into the left eye daily.  0  . escitalopram (LEXAPRO) 10 MG tablet Take 10 mg by mouth daily.    . ferrous sulfate 325 (65 FE) MG tablet Take 1 tablet (325 mg total) by mouth daily with breakfast. 30 tablet 0  . fluticasone (CUTIVATE) 0.05 % cream Apply 1 application topically 2 (two) times daily.  0  . irbesartan (AVAPRO) 300 MG tablet Take 1 tablet (300 mg total) by mouth at bedtime. 30 tablet 0  . ketoconazole (NIZORAL) 2 % cream Apply 1 application topically daily. 15 g 0  . metFORMIN (GLUCOPHAGE) 500 MG tablet take 1 tablet by mouth once daily with BREAKFAST 30 tablet 0  . niacin 500 MG CR capsule Take 500 mg by mouth 2 (two) times daily with a meal.    . Omega-3 Fatty Acids (FISH OIL) 1200 MG CAPS Take 500 mg by mouth daily.     . verapamil (VERELAN PM) 240 MG 24 hr capsule  Take 240 mg by mouth daily.     . Vitamin D, Cholecalciferol, 400 UNITS TABS Take 400 Units by mouth at bedtime.    . Vitamin D, Ergocalciferol, (DRISDOL) 50000 units CAPS capsule Take 1 capsule (50,000 Units total) by mouth every 7 (seven) days. 4 capsule 0   No current facility-administered medications on file prior to visit.     PAST MEDICAL HISTORY: Past Medical History:  Diagnosis Date  . Anemia   . Anxiety    takes Xanax daily  . Arthritis   . Back pain   . Blood transfusion   . Breast cancer ([place]) 2013   left breast  . Bronchitis    hx of  . Chronic fatigue syndrome   . Depression    takes Lexapro daily  . Diabetes mellitus without complication ([place])    boderline  . Diverticulosis   . Edema    feet and legs  .  Headache(784.0)   . History of colon polyps 1998   adenomatous  . Hyperlipidemia    takes Niacin daily  . Hypertension    takes Verapamil and Avapro daily  . Joint pain   . OSA (obstructive sleep apnea)   . Personal history of radiation therapy   . Pneumonia 4/12   Albuterol daily as needed  . Radiation 10/29/11-11/24/11   left breast 6100 cGy  . Shortness of breath    with exertion  . Spinal stenosis   . Tinnitus     PAST SURGICAL HISTORY: Past Surgical History:  Procedure Laterality Date  . APPENDECTOMY  1978  . BACK SURGERY    . BELPHAROPTOSIS REPAIR    . BREAST EXCISIONAL BIOPSY Right 02/06/2013  . BREAST LUMPECTOMY  08/2011   left  . BREAST LUMPECTOMY WITH NEEDLE LOCALIZATION Right 02/06/2013   Procedure: RIGHT BREAST LUMPECTOMY WITH NEEDLE LOCALIZATION;  Surgeon: Harl Bowie, MD;  Location: [facility];  Service: General;  Laterality: Right;  . COLONOSCOPY W/ POLYPECTOMY    . COLONOSCOPY WITH PROPOFOL N/A 10/27/2015   Procedure: COLONOSCOPY WITH PROPOFOL;  Surgeon: Mauri Pole, MD;  Location: [facility];  Service: Endoscopy;  Laterality: N/A;  . HARDWARE REMOVAL Left 04/20/2015   Procedure: HARDWARE REMOVAL LEFT LUMBAR FIVE SCREW;  Surgeon: Karie Chimera, MD;  Location: [facility];  Service: Neurosurgery;  Laterality: Left;  . JOINT REPLACEMENT Bilateral    bilateral knee  . POLYPECTOMY  1990's  . THYROID CYST EXCISION  1994  . TOTAL KNEE ARTHROPLASTY  2006   bilateral  . TRANSPHENOIDAL / TRANSNASAL HYPOPHYSECTOMY / RESECTION PITUITARY TUMOR  6/11    SOCIAL HISTORY: Social History   Tobacco Use  . Smoking status: Never Smoker  . Smokeless tobacco: Never Used  Substance Use Topics  . Alcohol use: No  . Drug use: No    FAMILY HISTORY: Family History  Problem Relation Age of Onset  . Heart disease Father   . Clotting disorder Father   . Stroke Father   . Hypertension Father   . Heart Problems Father   . Stroke Mother   . Diabetes Mother   .  Hypertension Mother   . Hyperlipidemia Mother   . Obesity Mother   . Cancer Sister 49       Breast Cancer  . Breast cancer Sister 55  . Colon cancer Neg Hx   . Neuropathy Neg Hx     ROS: Review of Systems  Constitutional: Positive for weight loss.  Respiratory: Negative for shortness of breath.  Cardiovascular: Negative for chest pain.  Gastrointestinal: Negative for nausea and vomiting.  Musculoskeletal:       Negative for muscle weakness    PHYSICAL EXAM: Blood pressure (!) 147/76, pulse 78, temperature 97.8 F (36.6 C), temperature source Oral, height '5\' 6"'  (1.676 m), weight 255 lb (115.7 kg), SpO2 98 %. Body mass index is 41.16 kg/m. Physical Exam  Constitutional: [name], place, and time. She appears well-developed and well-nourished.  Eyes: EOM are normal.  Neck: Normal range of motion.  Cardiovascular: Normal rate.  Pulmonary/Chest: Effort normal.  Musculoskeletal: Normal range of motion.  Neurological: She is alert and oriented to person, place, and time.  Skin: Skin is warm and dry.  Psychiatric: She has a normal mood and affect. Her behavior is normal.  Vitals reviewed.   RECENT LABS AND TESTS: BMET    Component Value Date/Time   NA 140 08/30/2017 0736   NA 143 05/10/2017 1006   NA 140 06/01/2016 1143   K 3.7 08/30/2017 0736   K 4.0 06/01/2016 1143   CL 106 08/30/2017 0736   CL 105 12/09/2012 1423   CO2 24 08/30/2017 0736   CO2 26 06/01/2016 1143   GLUCOSE 126 08/30/2017 0736   GLUCOSE 121 06/01/2016 1143   GLUCOSE 177 (H) 12/09/2012 1423   BUN 20 08/30/2017 0736   BUN 22 05/10/2017 1006   BUN 12.5 06/01/2016 1143   CREATININE 1.17 (H) 08/30/2017 0736   CREATININE 1.1 06/01/2016 1143   CALCIUM 9.9 08/30/2017 0736   CALCIUM 10.1 06/01/2016 1143   GFRNONAA 45 (L) 08/30/2017 0736   GFRAA 52 (L) 08/30/2017 0736   Lab Results  Component Value Date   HGBA1C 6.8 (H) 05/10/2017   HGBA1C 6.2 (H) 12/14/2016   HGBA1C 6.3 (H)  08/31/2016   HGBA1C (H) 11/11/2010    6.7 (NOTE)                                                                       According to the ADA Clinical Practice Recommendations for 2011, when HbA1c is used as a screening test:   >=6.5%   Diagnostic of Diabetes Mellitus           (if abnormal result  is confirmed)  5.7-6.4%   Increased risk of developing Diabetes Mellitus  References:Diagnosis and Classification of Diabetes Mellitus,Diabetes CBUL,8453,64(WOEHO 1):S62-S69 and Standards of Medical Care in         Diabetes - 2011,Diabetes Care,2011,34  (Suppl 1):S11-S61.   Lab Results  Component Value Date   INSULIN 10.5 05/10/2017   INSULIN 20.9 12/14/2016   INSULIN 23.5 08/31/2016   CBC    Component Value Date/Time   WBC 7.1 08/30/2017 0736   WBC 14.5 (H) 08/21/2016 2027   RBC 4.05 08/30/2017 0736   HGB 11.0 (L) 05/10/2017 1006   HGB 11.1 (L) 06/01/2016 1143   HCT 32.7 (L) 08/30/2017 0736   HCT 34.4 05/10/2017 1006   HCT 34.9 06/01/2016 1143   PLT 243 08/30/2017 0736   PLT 248 06/01/2016 1143   MCV 80.7 08/30/2017 0736   MCV 80 05/10/2017 1006   MCV 81.2 06/01/2016 1143   MCH 26.3 08/30/2017 0736   MCHC 32.6 08/30/2017 0736   RDW  15.8 (H) 08/30/2017 0736   RDW 16.5 (H) 05/10/2017 1006   RDW 15.8 (H) 06/01/2016 1143   LYMPHSABS 1.8 08/30/2017 0736   LYMPHSABS 1.3 05/10/2017 1006   LYMPHSABS 1.8 06/01/2016 1143   MONOABS 0.5 08/30/2017 0736   MONOABS 0.6 06/01/2016 1143   EOSABS 0.1 08/30/2017 0736   EOSABS 0.1 05/10/2017 1006   BASOSABS 0.0 08/30/2017 0736   BASOSABS 0.0 05/10/2017 1006   BASOSABS 0.0 06/01/2016 1143   Iron/TIBC/Ferritin/ %Sat    Component Value Date/Time   IRON 55 12/14/2016 1146   TIBC 329 12/14/2016 1146   FERRITIN 479 (H) 12/14/2016 1146   FERRITIN 362 (H) 04/15/2015 1058   IRONPCTSAT 17 12/14/2016 1146   Lipid Panel     Component Value Date/Time   CHOL 212 (H) 05/10/2017 1006   TRIG 116 05/10/2017 1006   HDL 44 05/10/2017 1006   CHOLHDL 6.4  11/11/2010 0555   VLDL 17 11/11/2010 0555   LDLCALC 145 (H) 05/10/2017 1006   Hepatic Function Panel     Component Value Date/Time   PROT 7.0 08/30/2017 0736   PROT 6.9 05/10/2017 1006   PROT 7.6 06/01/2016 1143   ALBUMIN 3.3 (L) 08/30/2017 0736   ALBUMIN 3.9 05/10/2017 1006   ALBUMIN 3.2 (L) 06/01/2016 1143   AST 10 08/30/2017 0736   AST 11 06/01/2016 1143   ALT 9 08/30/2017 0736   ALT 12 06/01/2016 1143   ALKPHOS 52 08/30/2017 0736   ALKPHOS 69 06/01/2016 1143   BILITOT 0.4 08/30/2017 0736   BILITOT 0.28 06/01/2016 1143      Component Value Date/Time   TSH 1.710 08/31/2016 1000   TSH 1.570 01/20/2016 1634   TSH 2.028Test methodology is 3rd generation TSH 11/27/2009 1004     Ref. Range 05/10/2017 10:06  Vitamin D, 25-Hydroxy Latest Ref Range: 30.0 - 100.0 ng/mL 39.3   ASSESSMENT AND PLAN: Essential hypertension - Plan: hydrochlorothiazide (HYDRODIURIL) 25 MG tablet  Vitamin D deficiency  At risk for heart disease  Class 3 severe obesity with serious comorbidity and body mass index (BMI) of 40.0 to 44.9 in adult, unspecified obesity type ([place])  PLAN: Hypertension We discussed sodium restriction, working on healthy weight loss, and a regular exercise program as the means to achieve improved blood pressure control. Zayda agreed with this plan and agreed to follow up as directed. We will continue to monitor her blood pressure as well as her progress with the above lifestyle modifications. We will refill HCTZ 25 mg #30 with no refills today. and will watch for signs of hypotension as she continues her lifestyle modifications.  Cardiovascular risk counselling [name] was given extended (15 minutes) coronary artery disease prevention counseling today. She is 72 y.o. female and has risk factors for heart disease including obesity. We discussed intensive lifestyle modifications today with an emphasis on specific weight loss instructions and strategies. Pt was also informed of the  importance of increasing exercise and decreasing saturated fats to help prevent heart disease.  Obesity [name] is currently in the action stage of change. As such, her goal is to continue with weight loss efforts She has agreed to portion control better and make smarter food choices, such as increase vegetables and decrease simple carbohydrates  [name] has been instructed to work up to a goal of 150 minutes of combined cardio and strengthening exercise per week for weight loss and overall health benefits. We discussed the following Behavioral Modification Strategies today: increasing lean protein intake and work on meal planning  and easy cooking plans   [name] has agreed to follow up with our clinic in 2 weeks. She was informed of the importance of frequent follow up visits to maximize her success with intensive lifestyle modifications for her multiple health conditions.    Today's visit was # 23 Starting weight: 280 lb Starting date: 255 lb Today's weight : 255 lb  Today's date: 09/13/2017 Total lbs lost to date: 25 (Patients must lose 7 lbs in the first 6 months to continue with counseling)   ASK: We discussed the diagnosis of obesity with [name] today and [name] agreed to give [name] permission to discuss obesity behavioral modification therapy today.  ASSESS: [name] has the diagnosis of obesity and her BMI today is 41.16 [name] is in the action stage of change   ADVISE: [name] was educated on the multiple health risks of obesity as well as the benefit of weight loss to improve her health. She was advised of the need for long term treatment and the importance of lifestyle modifications.  AGREE: Multiple dietary modification options and treatment options were discussed and  [name] agreed to the above obesity treatment plan.   [name], am acting as transcriptionist for [employer], PA-C I, Lacy Duverney [facility], have reviewed this note and agree with its content.

## 2017-09-19 ENCOUNTER — Telehealth (INDEPENDENT_AMBULATORY_CARE_PROVIDER_SITE_OTHER): Payer: Self-pay | Admitting: Physician Assistant

## 2017-09-19 NOTE — Telephone Encounter (Signed)
Water pills never were called in.  [facility] on [address].  Pt phone number [phone]

## 2017-09-19 NOTE — Telephone Encounter (Signed)
Spoke with the patient and informed her that the prescription was called in again today. The pharmacy stated they did not get the first escribe so I called it in verbally.  April, [place]

## 2017-09-21 DIAGNOSIS — G4733 Obstructive sleep apnea (adult) (pediatric): Secondary | ICD-10-CM | POA: Diagnosis not present

## 2017-09-27 ENCOUNTER — Ambulatory Visit (INDEPENDENT_AMBULATORY_CARE_PROVIDER_SITE_OTHER): Payer: Medicare Other | Admitting: Physician Assistant

## 2017-09-27 VITALS — BP 157/79 | HR 91 | Temp 98.5°F | Ht 66.0 in | Wt 256.0 lb

## 2017-09-27 DIAGNOSIS — E559 Vitamin D deficiency, unspecified: Secondary | ICD-10-CM | POA: Diagnosis not present

## 2017-09-27 DIAGNOSIS — D649 Anemia, unspecified: Secondary | ICD-10-CM

## 2017-09-27 DIAGNOSIS — Z6841 Body Mass Index (BMI) 40.0 and over, adult: Secondary | ICD-10-CM | POA: Diagnosis not present

## 2017-09-27 DIAGNOSIS — I10 Essential (primary) hypertension: Secondary | ICD-10-CM | POA: Diagnosis not present

## 2017-09-27 DIAGNOSIS — G4733 Obstructive sleep apnea (adult) (pediatric): Secondary | ICD-10-CM

## 2017-09-27 MED ORDER — VITAMIN D (ERGOCALCIFEROL) 1.25 MG (50000 UNIT) PO CAPS: 50000.0000 [IU] | ORAL_CAPSULE | ORAL | 0 refills | Status: DC

## 2017-09-27 NOTE — Progress Notes (Addendum)
Office: [phone]  /  Fax: [phone]   HPI:   Chief Complaint: OBESITY [name] is here to discuss her progress with her obesity treatment plan. She is on the Category 2 plan and is following her eating plan approximately 50 % of the time. She states she is exercising 0 minutes 0 times per week. [name] has not been following the meal plan and has been eating out more. She is motivated to get back on track and continue with weight loss. Her weight is 256 lb (116.1 kg) today and has gained 1 pound since her last visit. She has lost 24 lbs since starting treatment with [name].  Vitamin D Deficiency [name] has a diagnosis of vitamin D deficiency. She is currently taking prescription Vit D and denies nausea, vomiting or muscle weakness.  Normocytic Anemia [name]'s Hgb at 10.7. She admits to fatigue and change in taste. She denies chest pain shortness of breath, pallor, or weakness. [name] will follow up with her primary care physician for lab work tomorrow. She is advised on following up with her primary care physician for further management of anemia, including iron studies and B12.  Obstructive Sleep Apnea [name] has not been using her CPAP, she states she is awaiting new equipment. Her blood pressure is elevated, likely due to untreated obstructive sleep apnea.   Hypertension [name] is a 72 y.o. female with hypertension. [name]'s blood pressure is elevated. She states she has not had received a refill of her hydrochlorothiazide sent to her pharmacy. Also, with history of obstructive sleep apnea, not using CPAP. She denies chest pain or shortness of breath. She is working weight loss to help control her blood pressure with the goal of decreasing her risk of heart attack and stroke. [name]'s blood pressure is not currently controlled.  ALLERGIES: Allergies  Allergen Reactions  . Cymbalta [Duloxetine Hcl] Other (See Comments)    Stomach pain  . Gabapentin Itching  . Lipitor  [Atorvastatin] Other (See Comments)    Cramps  . Lisinopril Swelling  . Maxzide [Hydrochlorothiazide W-Triamterene] Other (See Comments)    Cramping   . Pravastatin Sodium Other (See Comments)    Headache   . Simvastatin Other (See Comments)    Memory loss  . Lodine [Etodolac] Itching    MEDICATIONS: Current Outpatient Medications on File Prior to Visit  Medication Sig Dispense Refill  . amitriptyline (ELAVIL) 25 MG tablet Take 25 mg by mouth every morning.     [name] aspirin 81 MG tablet Take 81 mg by mouth daily.    . blood glucose meter kit and supplies KIT Dispense based on patient and insurance preference. Use up to four times daily as directed. (FOR ICD-9 250.00, 250.01). 1 each 0  . DUREZOL 0.05 % EMUL Place 1 drop into the left eye daily.  0  . escitalopram (LEXAPRO) 10 MG tablet Take 10 mg by mouth daily.    . ferrous sulfate 325 (65 FE) MG tablet Take 1 tablet (325 mg total) by mouth daily with breakfast. 30 tablet 0  . fluticasone (CUTIVATE) 0.05 % cream Apply 1 application topically 2 (two) times daily.  0  . hydrochlorothiazide (HYDRODIURIL) 25 MG tablet Take 1 tablet (25 mg total) by mouth daily. 30 tablet 0  . irbesartan (AVAPRO) 300 MG tablet Take 1 tablet (300 mg total) by mouth at bedtime. 30 tablet 0  . ketoconazole (NIZORAL) 2 % cream Apply 1 application topically daily. 15 g 0  . metFORMIN (GLUCOPHAGE) 500 MG tablet take  1 tablet by mouth once daily with BREAKFAST 30 tablet 0  . niacin 500 MG CR capsule Take 500 mg by mouth 2 (two) times daily with a meal.    . Omega-3 Fatty Acids (FISH OIL) 1200 MG CAPS Take 500 mg by mouth daily.     . verapamil (VERELAN PM) 240 MG 24 hr capsule Take 240 mg by mouth daily.     . Vitamin D, Cholecalciferol, 400 UNITS TABS Take 400 Units by mouth at bedtime.    . Vitamin D, Ergocalciferol, (DRISDOL) 50000 units CAPS capsule Take 1 capsule (50,000 Units total) by mouth every 7 (seven) days. 4 capsule 0   No current  facility-administered medications on file prior to visit.     PAST MEDICAL HISTORY: Past Medical History:  Diagnosis Date  . Anemia   . Anxiety    takes Xanax daily  . Arthritis   . Back pain   . Blood transfusion   . Breast cancer ([place]) 2013   left breast  . Bronchitis    hx of  . Chronic fatigue syndrome   . Depression    takes Lexapro daily  . Diabetes mellitus without complication ([place])    boderline  . Diverticulosis   . Edema    feet and legs  . Headache(784.0)   . History of colon polyps 1998   adenomatous  . Hyperlipidemia    takes Niacin daily  . Hypertension    takes Verapamil and Avapro daily  . Joint pain   . OSA (obstructive sleep apnea)   . Personal history of radiation therapy   . Pneumonia 4/12   Albuterol daily as needed  . Radiation 10/29/11-11/24/11   left breast 6100 cGy  . Shortness of breath    with exertion  . Spinal stenosis   . Tinnitus     PAST SURGICAL HISTORY: Past Surgical History:  Procedure Laterality Date  . APPENDECTOMY  1978  . BACK SURGERY    . BELPHAROPTOSIS REPAIR    . BREAST EXCISIONAL BIOPSY Right 02/06/2013  . BREAST LUMPECTOMY  08/2011   left  . BREAST LUMPECTOMY WITH NEEDLE LOCALIZATION Right 02/06/2013   Procedure: RIGHT BREAST LUMPECTOMY WITH NEEDLE LOCALIZATION;  Surgeon: Harl Bowie, MD;  Location: [facility];  Service: General;  Laterality: Right;  . COLONOSCOPY W/ POLYPECTOMY    . COLONOSCOPY WITH PROPOFOL N/A 10/27/2015   Procedure: COLONOSCOPY WITH PROPOFOL;  Surgeon: Mauri Pole, MD;  Location: [facility];  Service: Endoscopy;  Laterality: N/A;  . HARDWARE REMOVAL Left 04/20/2015   Procedure: HARDWARE REMOVAL LEFT LUMBAR FIVE SCREW;  Surgeon: Karie Chimera, MD;  Location: [facility];  Service: Neurosurgery;  Laterality: Left;  . JOINT REPLACEMENT Bilateral    bilateral knee  . POLYPECTOMY  1990's  . THYROID CYST EXCISION  1994  . TOTAL KNEE ARTHROPLASTY  2006   bilateral  . TRANSPHENOIDAL / TRANSNASAL  HYPOPHYSECTOMY / RESECTION PITUITARY TUMOR  6/11    SOCIAL HISTORY: Social History   Tobacco Use  . Smoking status: Never Smoker  . Smokeless tobacco: Never Used  Substance Use Topics  . Alcohol use: No  . Drug use: No    FAMILY HISTORY: Family History  Problem Relation Age of Onset  . Heart disease Father   . Clotting disorder Father   . Stroke Father   . Hypertension Father   . Heart Problems Father   . Stroke Mother   . Diabetes Mother   . Hypertension Mother   .  Hyperlipidemia Mother   . Obesity Mother   . Cancer Sister 36       Breast Cancer  . Breast cancer Sister 28  . Colon cancer Neg Hx   . Neuropathy Neg Hx     ROS: Review of Systems  Constitutional: Positive for malaise/fatigue. Negative for weight loss.  Respiratory: Negative for shortness of breath.   Cardiovascular: Negative for chest pain.  Gastrointestinal: Negative for nausea and vomiting.  Musculoskeletal:       Negative muscle weakness  Skin:       Negative pallor  Neurological: Negative for weakness.    PHYSICAL EXAM: Blood pressure (!) 157/79, pulse 91, temperature 98.5 F (36.9 C), height _0  (1.676 m), weight 256 lb (116.1 kg), SpO2 99 %. Body mass index is 41.32 kg/m. Physical Exam  Constitutional: [name], place, and time. She appears well-developed and well-nourished.  Cardiovascular: Normal rate.  Pulmonary/Chest: Effort normal.  Musculoskeletal: Normal range of motion.  Neurological: [name], place, and time.  Skin: Skin is warm and dry.  Psychiatric: She has a normal mood and affect. Her behavior is normal.  Vitals reviewed.   RECENT LABS AND TESTS: BMET    Component Value Date/Time   NA 140 08/30/2017 0736   NA 143 05/10/2017 1006   NA 140 06/01/2016 1143   K 3.7 08/30/2017 0736   K 4.0 06/01/2016 1143   CL 106 08/30/2017 0736   CL 105 12/09/2012 1423   CO2 24 08/30/2017 0736   CO2 26 06/01/2016 1143   GLUCOSE 126 08/30/2017  0736   GLUCOSE 121 06/01/2016 1143   GLUCOSE 177 (H) 12/09/2012 1423   BUN 20 08/30/2017 0736   BUN 22 05/10/2017 1006   BUN 12.5 06/01/2016 1143   CREATININE 1.17 (H) 08/30/2017 0736   CREATININE 1.1 06/01/2016 1143   CALCIUM 9.9 08/30/2017 0736   CALCIUM 10.1 06/01/2016 1143   GFRNONAA 45 (L) 08/30/2017 0736   GFRAA 52 (L) 08/30/2017 0736   Lab Results  Component Value Date   HGBA1C 6.8 (H) 05/10/2017   HGBA1C 6.2 (H) 12/14/2016   HGBA1C 6.3 (H) 08/31/2016   HGBA1C (H) 11/11/2010    6.7 (NOTE)                                                                       According to the ADA Clinical Practice Recommendations for 2011, when HbA1c is used as a screening test:   >=6.5%   Diagnostic of Diabetes Mellitus           (if abnormal result  is confirmed)  5.7-6.4%   Increased risk of developing Diabetes Mellitus  References:Diagnosis and Classification of Diabetes Mellitus,Diabetes MPNT,6144,31(VQMGQ 1):S62-S69 and Standards of Medical Care in         Diabetes - 2011,Diabetes Care,2011,34  (Suppl 1):S11-S61.   Lab Results  Component Value Date   INSULIN 10.5 05/10/2017   INSULIN 20.9 12/14/2016   INSULIN 23.5 08/31/2016   CBC    Component Value Date/Time   WBC 7.1 08/30/2017 0736   WBC 14.5 (H) 08/21/2016 2027   RBC 4.05 08/30/2017 0736   HGB 11.0 (L) 05/10/2017 1006   HGB 11.1 (L) 06/01/2016 1143  HCT 32.7 (L) 08/30/2017 0736   HCT 34.4 05/10/2017 1006   HCT 34.9 06/01/2016 1143   PLT 243 08/30/2017 0736   PLT 248 06/01/2016 1143   MCV 80.7 08/30/2017 0736   MCV 80 05/10/2017 1006   MCV 81.2 06/01/2016 1143   MCH 26.3 08/30/2017 0736   MCHC 32.6 08/30/2017 0736   RDW 15.8 (H) 08/30/2017 0736   RDW 16.5 (H) 05/10/2017 1006   RDW 15.8 (H) 06/01/2016 1143   LYMPHSABS 1.8 08/30/2017 0736   LYMPHSABS 1.3 05/10/2017 1006   LYMPHSABS 1.8 06/01/2016 1143   MONOABS 0.5 08/30/2017 0736   MONOABS 0.6 06/01/2016 1143   EOSABS 0.1 08/30/2017 0736   EOSABS 0.1 05/10/2017  1006   BASOSABS 0.0 08/30/2017 0736   BASOSABS 0.0 05/10/2017 1006   BASOSABS 0.0 06/01/2016 1143   Iron/TIBC/Ferritin/ %Sat    Component Value Date/Time   IRON 55 12/14/2016 1146   TIBC 329 12/14/2016 1146   FERRITIN 479 (H) 12/14/2016 1146   FERRITIN 362 (H) 04/15/2015 1058   IRONPCTSAT 17 12/14/2016 1146   Lipid Panel     Component Value Date/Time   CHOL 212 (H) 05/10/2017 1006   TRIG 116 05/10/2017 1006   HDL 44 05/10/2017 1006   CHOLHDL 6.4 11/11/2010 0555   VLDL 17 11/11/2010 0555   LDLCALC 145 (H) 05/10/2017 1006   Hepatic Function Panel     Component Value Date/Time   PROT 7.0 08/30/2017 0736   PROT 6.9 05/10/2017 1006   PROT 7.6 06/01/2016 1143   ALBUMIN 3.3 (L) 08/30/2017 0736   ALBUMIN 3.9 05/10/2017 1006   ALBUMIN 3.2 (L) 06/01/2016 1143   AST 10 08/30/2017 0736   AST 11 06/01/2016 1143   ALT 9 08/30/2017 0736   ALT 12 06/01/2016 1143   ALKPHOS 52 08/30/2017 0736   ALKPHOS 69 06/01/2016 1143   BILITOT 0.4 08/30/2017 0736   BILITOT 0.28 06/01/2016 1143      Component Value Date/Time   TSH 1.710 08/31/2016 1000   TSH 1.570 01/20/2016 1634   TSH 2.028 Test methodology is 3rd generation TSH 11/27/2009 1004   Results for ALAZE, GARVERICK (MRN [id number]) as of 09/27/2017 16:24  Ref. Range 05/10/2017 10:06  Vitamin D, 25-Hydroxy Latest Ref Range: 30.0 - 100.0 ng/mL 39.3   ASSESSMENT AND PLAN: Vitamin D deficiency - Plan: Vitamin D, Ergocalciferol, (DRISDOL) 50000 units CAPS capsule  Normocytic anemia  OSA (obstructive sleep apnea)  Essential hypertension  Class 3 severe obesity with serious comorbidity and body mass index (BMI) of 40.0 to 44.9 in adult, unspecified obesity type (HCC)  PLAN:  Vitamin D Deficiency [name] was informed that low vitamin D levels contributes to fatigue and are associated with obesity, breast, and colon cancer. [name] agrees to continue taking prescription Vit D _0 ,000 IU every week #4 and we will refill for 1 month. She  will follow up for routine testing of vitamin D, at least 2-3 times per year. She was informed of the risk of over-replacement of vitamin D and agrees to not increase her dose unless she discusses this with [name] first. [name] agrees to follow up with our clinic in 2 weeks.  Normocytic Anemia The diagnosis of normocytic anemia was discussed with [name] and was explained in detail. She is to follow up with her primary care physician to have further investigative labs of her anemia including iron panel and Vit B12. [name] agrees to follow up with our clinic in 2 weeks.  Obstructive Sleep Apnea [name] was  advised to use her CPAP and she agrees to follow up with our clinic in 2 weeks.  Hypertension We discussed sodium restriction, working on healthy weight loss, and a regular exercise program as the means to achieve improved blood pressure control. [name] agreed with this plan and agreed to follow up as directed. We will continue to monitor her blood pressure as well as her progress with the above lifestyle modifications. She will continue her medications as prescribed and will watch for signs of hypotension as she continues her lifestyle modifications.  Obesity [name] is currently in the action stage of change. As such, her goal is to get back to weightloss efforts  She has agreed to portion control better and make smarter food choices, such as increase vegetables and decrease simple carbohydrates  [name] has been instructed to work up to a goal of 150 minutes of combined cardio and strengthening exercise per week for weight loss and overall health benefits. We discussed the following Behavioral Modification Strategies today: increasing lean protein intake and work on meal planning and easy cooking plans   [place] has agreed to follow up with our clinic in 2 weeks. She was informed of the importance of frequent follow up visits to maximize her success with intensive lifestyle modifications for her multiple  health conditions.   [name], am acting as transcriptionist for Lacy Duverney, PA-C I, Lacy Duverney, have reviewed this note and agree with its content

## 2017-09-28 DIAGNOSIS — L299 Pruritus, unspecified: Secondary | ICD-10-CM | POA: Diagnosis not present

## 2017-09-28 DIAGNOSIS — Z0000 Encounter for general adult medical examination without abnormal findings: Secondary | ICD-10-CM | POA: Diagnosis not present

## 2017-09-28 DIAGNOSIS — E7800 Pure hypercholesterolemia, unspecified: Secondary | ICD-10-CM | POA: Diagnosis not present

## 2017-09-28 DIAGNOSIS — E1142 Type 2 diabetes mellitus with diabetic polyneuropathy: Secondary | ICD-10-CM | POA: Diagnosis not present

## 2017-09-28 DIAGNOSIS — I10 Essential (primary) hypertension: Secondary | ICD-10-CM | POA: Diagnosis not present

## 2017-09-30 ENCOUNTER — Other Ambulatory Visit (INDEPENDENT_AMBULATORY_CARE_PROVIDER_SITE_OTHER): Payer: Self-pay | Admitting: Physician Assistant

## 2017-09-30 DIAGNOSIS — D649 Anemia, unspecified: Secondary | ICD-10-CM

## 2017-10-01 DIAGNOSIS — Z01411 Encounter for gynecological examination (general) (routine) with abnormal findings: Secondary | ICD-10-CM | POA: Diagnosis not present

## 2017-10-01 DIAGNOSIS — Z853 Personal history of malignant neoplasm of breast: Secondary | ICD-10-CM | POA: Diagnosis not present

## 2017-10-11 ENCOUNTER — Ambulatory Visit (INDEPENDENT_AMBULATORY_CARE_PROVIDER_SITE_OTHER): Payer: Medicare Other | Admitting: Physician Assistant

## 2017-10-11 VITALS — BP 147/78 | HR 74 | Temp 98.0°F | Ht 66.0 in | Wt 258.0 lb

## 2017-10-11 DIAGNOSIS — H538 Other visual disturbances: Secondary | ICD-10-CM | POA: Diagnosis not present

## 2017-10-11 DIAGNOSIS — E1122 Type 2 diabetes mellitus with diabetic chronic kidney disease: Secondary | ICD-10-CM

## 2017-10-11 DIAGNOSIS — H1820 Unspecified corneal edema: Secondary | ICD-10-CM | POA: Diagnosis not present

## 2017-10-11 DIAGNOSIS — H20042 Secondary noninfectious iridocyclitis, left eye: Secondary | ICD-10-CM | POA: Diagnosis not present

## 2017-10-11 DIAGNOSIS — H20041 Secondary noninfectious iridocyclitis, right eye: Secondary | ICD-10-CM | POA: Diagnosis not present

## 2017-10-11 DIAGNOSIS — Z9189 Other specified personal risk factors, not elsewhere classified: Secondary | ICD-10-CM | POA: Diagnosis not present

## 2017-10-11 DIAGNOSIS — N181 Chronic kidney disease, stage 1: Secondary | ICD-10-CM

## 2017-10-11 DIAGNOSIS — D649 Anemia, unspecified: Secondary | ICD-10-CM

## 2017-10-11 DIAGNOSIS — Z6841 Body Mass Index (BMI) 40.0 and over, adult: Secondary | ICD-10-CM

## 2017-10-11 MED ORDER — METFORMIN HCL 500 MG PO TABS: ORAL_TABLET | ORAL | 0 refills | Status: DC

## 2017-10-11 MED ORDER — FERROUS SULFATE 325 (65 FE) MG PO TABS: 325.0000 mg | ORAL_TABLET | Freq: Every day | ORAL | 0 refills | Status: DC

## 2017-10-11 NOTE — Progress Notes (Signed)
Office: [phone]  /  Fax: [phone]   HPI:   Chief Complaint: OBESITY [name] is here to discuss her progress with her obesity treatment plan. She is on the  portion control better and make smarter food choices, such as increase vegetables and decrease simple carbohydrates  and is following her eating plan approximately 80 % of the time. She states she is exercising 0 minutes 0 times per week. [name] has been skipping some of her meals and eats out more. She declines keeping a food journal and states has a hard time following a structured meal plan.  Her weight is 258 lb (117 kg) today and she has gained 2 pounds since her last visit. She has lost 22 lbs since starting treatment with [name].  Diabetes II non insulin with stage I chronic kidney disease [name] has a diagnosis of diabetes type II. Ankita states she has not gotten a meter, thus she is not checking her blood sugar at home. [name] denies any hypoglycemic episodes. She has been working on intensive lifestyle modifications including diet, exercise, and weight loss to help control her blood glucose levels.  At risk for cardiovascular disease [name] is at a higher than average risk for cardiovascular disease due to obesity and diabetes. She currently denies any chest pain.  Normocytic Anemia [name] has a diagnosis of anemia.  She notes dizziness on occasion (unsure if related to hypoglycemia) and is on iron supplementation.   ALLERGIES: Allergies  Allergen Reactions  . Cymbalta [Duloxetine Hcl] Other (See Comments)    Stomach pain  . Gabapentin Itching  . Lipitor [Atorvastatin] Other (See Comments)    Cramps  . Lisinopril Swelling  . Maxzide [Hydrochlorothiazide W-Triamterene] Other (See Comments)    Cramping   . Pravastatin Sodium Other (See Comments)    Headache   . Simvastatin Other (See Comments)    Memory loss  . Lodine [Etodolac] Itching    MEDICATIONS: Current Outpatient Medications on File Prior to Visit    Medication Sig Dispense Refill  . amitriptyline (ELAVIL) 25 MG tablet Take 25 mg by mouth every morning.     [name] aspirin 81 MG tablet Take 81 mg by mouth daily.    . blood glucose meter kit and supplies KIT Dispense based on patient and insurance preference. Use up to four times daily as directed. (FOR ICD-9 250.00, 250.01). 1 each 0  . DUREZOL 0.05 % EMUL Place 1 drop into the left eye daily.  0  . escitalopram (LEXAPRO) 10 MG tablet Take 10 mg by mouth daily.    . fluticasone (CUTIVATE) 0.05 % cream Apply 1 application topically 2 (two) times daily.  0  . hydrochlorothiazide (HYDRODIURIL) 25 MG tablet Take 1 tablet (25 mg total) by mouth daily. 30 tablet 0  . irbesartan (AVAPRO) 300 MG tablet Take 1 tablet (300 mg total) by mouth at bedtime. 30 tablet 0  . ketoconazole (NIZORAL) 2 % cream Apply 1 application topically daily. 15 g 0  . niacin 500 MG CR capsule Take 500 mg by mouth 2 (two) times daily with a meal.    . Omega-3 Fatty Acids (FISH OIL) 1200 MG CAPS Take 500 mg by mouth daily.     . verapamil (VERELAN PM) 240 MG 24 hr capsule Take 240 mg by mouth daily.     . Vitamin D, Cholecalciferol, 400 UNITS TABS Take 400 Units by mouth at bedtime.    . Vitamin D, Ergocalciferol, (DRISDOL) 50000 units CAPS capsule Take 1 capsule (50,000  Units total) by mouth every 7 (seven) days. 4 capsule 0   No current facility-administered medications on file prior to visit.     PAST MEDICAL HISTORY: Past Medical History:  Diagnosis Date  . Anemia   . Anxiety    takes Xanax daily  . Arthritis   . Back pain   . Blood transfusion   . Breast cancer ([name]) 2013   left breast  . Bronchitis    hx of  . Chronic fatigue syndrome   . Depression    takes Lexapro daily  . Diabetes mellitus without complication ([name])    boderline  . Diverticulosis   . Edema    feet and legs  . Headache(784.0)   . History of colon polyps 1998   adenomatous  . Hyperlipidemia    takes Niacin daily  . Hypertension     takes Verapamil and Avapro daily  . Joint pain   . OSA (obstructive sleep apnea)   . Personal history of radiation therapy   . Pneumonia 4/12   Albuterol daily as needed  . Radiation 10/29/11-11/24/11   left breast 6100 cGy  . Shortness of breath    with exertion  . Spinal stenosis   . Tinnitus     PAST SURGICAL HISTORY: Past Surgical History:  Procedure Laterality Date  . APPENDECTOMY  1978  . BACK SURGERY    . BELPHAROPTOSIS REPAIR    . BREAST EXCISIONAL BIOPSY Right 02/06/2013  . BREAST LUMPECTOMY  08/2011   left  . BREAST LUMPECTOMY WITH NEEDLE LOCALIZATION Right 02/06/2013   Procedure: RIGHT BREAST LUMPECTOMY WITH NEEDLE LOCALIZATION;  Surgeon: Harl Bowie, MD;  Location: [name];  Service: General;  Laterality: Right;  . COLONOSCOPY W/ POLYPECTOMY    . COLONOSCOPY WITH PROPOFOL N/A 10/27/2015   Procedure: COLONOSCOPY WITH PROPOFOL;  Surgeon: Mauri Pole, MD;  Location: [name];  Service: Endoscopy;  Laterality: N/A;  . HARDWARE REMOVAL Left 04/20/2015   Procedure: HARDWARE REMOVAL LEFT LUMBAR FIVE SCREW;  Surgeon: [name] Chimera, MD;  Location: [name];  Service: Neurosurgery;  Laterality: Left;  . JOINT REPLACEMENT Bilateral    bilateral knee  . POLYPECTOMY  1990's  . THYROID CYST EXCISION  1994  . TOTAL KNEE ARTHROPLASTY  2006   bilateral  . TRANSPHENOIDAL / TRANSNASAL HYPOPHYSECTOMY / RESECTION PITUITARY TUMOR  6/11    SOCIAL HISTORY: Social History   Tobacco Use  . Smoking status: Never Smoker  . Smokeless tobacco: Never Used  Substance Use Topics  . Alcohol use: No  . Drug use: No    FAMILY HISTORY: Family History  Problem Relation Age of Onset  . Heart disease Father   . Clotting disorder Father   . Stroke Father   . Hypertension Father   . Heart Problems Father   . Stroke Mother   . Diabetes Mother   . Hypertension Mother   . Hyperlipidemia Mother   . Obesity Mother   . Cancer Sister 65       Breast Cancer  . Breast cancer Sister  36  . Colon cancer Neg Hx   . Neuropathy Neg Hx     ROS: Review of Systems  Constitutional: Negative for weight loss.  Cardiovascular: Negative for chest pain.  Neurological: Positive for dizziness.  Endo/Heme/Allergies:       Unknown hypoglcyemia    PHYSICAL EXAM: Blood pressure (!) 147/78, pulse 74, temperature 98 F (36.7 C), temperature source Oral, height '5\' 6"'  (1.676 m), weight 258 lb (  117 kg), SpO2 100 %. Body mass index is 41.64 kg/m. Physical Exam  Constitutional: [name], place, and time. She appears well-developed and well-nourished.  Cardiovascular: Normal rate.  Pulmonary/Chest: Effort normal.  Musculoskeletal: Normal range of motion.  Neurological: [name], place, and time.  Skin: Skin is warm and dry.  Psychiatric: She has a normal mood and affect. Her behavior is normal.  Vitals reviewed.   RECENT LABS AND TESTS: BMET    Component Value Date/Time   NA 140 08/30/2017 0736   NA 143 05/10/2017 1006   NA 140 06/01/2016 1143   K 3.7 08/30/2017 0736   K 4.0 06/01/2016 1143   CL 106 08/30/2017 0736   CL 105 12/09/2012 1423   CO2 24 08/30/2017 0736   CO2 26 06/01/2016 1143   GLUCOSE 126 08/30/2017 0736   GLUCOSE 121 06/01/2016 1143   GLUCOSE 177 (H) 12/09/2012 1423   BUN 20 08/30/2017 0736   BUN 22 05/10/2017 1006   BUN 12.5 06/01/2016 1143   CREATININE 1.17 (H) 08/30/2017 0736   CREATININE 1.1 06/01/2016 1143   CALCIUM 9.9 08/30/2017 0736   CALCIUM 10.1 06/01/2016 1143   GFRNONAA 45 (L) 08/30/2017 0736   GFRAA 52 (L) 08/30/2017 0736   Lab Results  Component Value Date   HGBA1C 6.8 (H) 05/10/2017   HGBA1C 6.2 (H) 12/14/2016   HGBA1C 6.3 (H) 08/31/2016   HGBA1C (H) 11/11/2010    6.7 (NOTE)                                                                       According to the ADA Clinical Practice Recommendations for 2011, when HbA1c is used as a screening test:   >=6.5%   Diagnostic of Diabetes Mellitus            (if abnormal result  is confirmed)  5.7-6.4%   Increased risk of developing Diabetes Mellitus  References:Diagnosis and Classification of Diabetes Mellitus,Diabetes BMWU,1324,40(NUUVO 1):S62-S69 and Standards of Medical Care in         Diabetes - 2011,Diabetes Care,2011,34  (Suppl 1):S11-S61.   Lab Results  Component Value Date   INSULIN 10.5 05/10/2017   INSULIN 20.9 12/14/2016   INSULIN 23.5 08/31/2016   CBC    Component Value Date/Time   WBC 7.1 08/30/2017 0736   WBC 14.5 (H) 08/21/2016 2027   RBC 4.05 08/30/2017 0736   HGB 11.0 (L) 05/10/2017 1006   HGB 11.1 (L) 06/01/2016 1143   HCT 32.7 (L) 08/30/2017 0736   HCT 34.4 05/10/2017 1006   HCT 34.9 06/01/2016 1143   PLT 243 08/30/2017 0736   PLT 248 06/01/2016 1143   MCV 80.7 08/30/2017 0736   MCV 80 05/10/2017 1006   MCV 81.2 06/01/2016 1143   MCH 26.3 08/30/2017 0736   MCHC 32.6 08/30/2017 0736   RDW 15.8 (H) 08/30/2017 0736   RDW 16.5 (H) 05/10/2017 1006   RDW 15.8 (H) 06/01/2016 1143   LYMPHSABS 1.8 08/30/2017 0736   LYMPHSABS 1.3 05/10/2017 1006   LYMPHSABS 1.8 06/01/2016 1143   MONOABS 0.5 08/30/2017 0736   MONOABS 0.6 06/01/2016 1143   EOSABS 0.1 08/30/2017 0736   EOSABS 0.1 05/10/2017 1006   BASOSABS 0.0  08/30/2017 0736   BASOSABS 0.0 05/10/2017 1006   BASOSABS 0.0 06/01/2016 1143   Iron/TIBC/Ferritin/ %Sat    Component Value Date/Time   IRON 55 12/14/2016 1146   TIBC 329 12/14/2016 1146   FERRITIN 479 (H) 12/14/2016 1146   FERRITIN 362 (H) 04/15/2015 1058   IRONPCTSAT 17 12/14/2016 1146   Lipid Panel     Component Value Date/Time   CHOL 212 (H) 05/10/2017 1006   TRIG 116 05/10/2017 1006   HDL 44 05/10/2017 1006   CHOLHDL 6.4 11/11/2010 0555   VLDL 17 11/11/2010 0555   LDLCALC 145 (H) 05/10/2017 1006   Hepatic Function Panel     Component Value Date/Time   PROT 7.0 08/30/2017 0736   PROT 6.9 05/10/2017 1006   PROT 7.6 06/01/2016 1143   ALBUMIN 3.3 (L) 08/30/2017 0736   ALBUMIN 3.9  05/10/2017 1006   ALBUMIN 3.2 (L) 06/01/2016 1143   AST 10 08/30/2017 0736   AST 11 06/01/2016 1143   ALT 9 08/30/2017 0736   ALT 12 06/01/2016 1143   ALKPHOS 52 08/30/2017 0736   ALKPHOS 69 06/01/2016 1143   BILITOT 0.4 08/30/2017 0736   BILITOT 0.28 06/01/2016 1143      Component Value Date/Time   TSH 1.710 08/31/2016 1000   TSH 1.570 01/20/2016 1634   TSH 2.028 11/27/2009 1004    ASSESSMENT AND PLAN: Type 2 diabetes mellitus with stage 1 chronic kidney disease, without long-term current use of insulin ([place]) - Plan: metFORMIN (GLUCOPHAGE) 500 MG tablet  Normocytic anemia - Plan: ferrous sulfate 325 (65 FE) MG tablet  At risk for heart disease  Class 3 severe obesity with serious comorbidity and body mass index (BMI) of 40.0 to 44.9 in adult, unspecified obesity type (HCC)  PLAN:  Diabetes II non insulin with stage I chronic kidney disease Lisset has been given extensive diabetes education by myself today including ideal fasting and post-prandial blood glucose readings, individual ideal Hgb A1c goals and hypoglycemia prevention. We discussed the importance of good blood sugar control to decrease the likelihood of diabetic complications such as nephropathy, neuropathy, limb loss, blindness, coronary artery disease, and death. We discussed the importance of intensive lifestyle modification including diet, exercise and weight loss as the first line treatment for diabetes. Shandee agrees to continue metformin 500 mg qd #30 with no refills and will get meter. She agrees to follow up at the agreed upon time.  Cardiovascular risk counseling Nickol was given extended (15 minutes) coronary artery disease prevention counseling today. She is 72 y.o. female and has risk factors for heart disease including obesity. We discussed intensive lifestyle modifications today with an emphasis on specific weight loss instructions and strategies. Pt was also informed of the importance of increasing exercise  and decreasing saturated fats to help prevent heart disease.  Normocytic Anemia The diagnosis of normocytic anemia was discussed with Maryjayne and was explained in detail. She was given suggestions of iron rich foods and she agreed to continue ferrous sulfate 325 mg qd #30 with no refills.   Obesity Markitta is currently in the action stage of change. As such, her goal is to continue with weight loss efforts She has agreed to portion control better and make smarter food choices, such as increase vegetables and decrease simple carbohydrates  Tanza has been instructed to work up to a goal of 150 minutes of combined cardio and strengthening exercise per week for weight loss and overall health benefits. We discussed the following Behavioral Modification Strategies today:  decrease eating out and no skipping meals  Cystal has agreed to follow up with our clinic in 2 weeks. She was informed of the importance of frequent follow up visits to maximize her success with intensive lifestyle modifications for her multiple health conditions.   Corey Skains, am acting as transcriptionist for [employer], PA-C I, Lacy Duverney [facility], have reviewed this note and agree with its content

## 2017-10-25 ENCOUNTER — Ambulatory Visit (INDEPENDENT_AMBULATORY_CARE_PROVIDER_SITE_OTHER): Payer: Medicare Other | Admitting: Physician Assistant

## 2017-10-25 VITALS — BP 146/76 | HR 92 | Temp 98.0°F | Ht 66.0 in | Wt 259.0 lb

## 2017-10-25 DIAGNOSIS — Z9189 Other specified personal risk factors, not elsewhere classified: Secondary | ICD-10-CM

## 2017-10-25 DIAGNOSIS — N181 Chronic kidney disease, stage 1: Secondary | ICD-10-CM

## 2017-10-25 DIAGNOSIS — Z6841 Body Mass Index (BMI) 40.0 and over, adult: Secondary | ICD-10-CM | POA: Diagnosis not present

## 2017-10-25 DIAGNOSIS — G4733 Obstructive sleep apnea (adult) (pediatric): Secondary | ICD-10-CM

## 2017-10-25 DIAGNOSIS — I10 Essential (primary) hypertension: Secondary | ICD-10-CM | POA: Diagnosis not present

## 2017-10-25 DIAGNOSIS — E1122 Type 2 diabetes mellitus with diabetic chronic kidney disease: Secondary | ICD-10-CM

## 2017-10-25 MED ORDER — METFORMIN HCL 500 MG PO TABS
ORAL_TABLET | ORAL | 0 refills | Status: DC
Start: 2017-10-25 — End: 2017-11-07

## 2017-10-25 MED ORDER — HYDROCHLOROTHIAZIDE 25 MG PO TABS: 25.0000 mg | ORAL_TABLET | Freq: Every day | ORAL | 0 refills | Status: DC

## 2017-10-25 NOTE — Progress Notes (Signed)
Office: [phone]  /  Fax: [phone]   HPI:   Chief Complaint: OBESITY Kenzy is here to discuss her progress with her obesity treatment plan. She is on the portion control better and make smarter food choices, such as increase vegetables and decrease simple carbohydrates  and is following her eating plan approximately 80 % of the time. She states she is exercising 0 minutes 0 times per week. Arbor has not been as mindful of her eating and continues to eat out more. Also, skips dinner on many occasions.  Her weight is 259 lb (117.5 kg) today and has gained 1 pound since her last visit. She has lost 21 lbs since starting treatment with [name].  Hypertension Kareemah Grounds is a 72 y.o. female with hypertension. Azora's blood pressure is high and she denies chest pain or shortness of breath. She has a history of obstructive sleep apnea on CPAP. However, CPAP has not been replaced in the past 10 years. She is working weight loss to help control her blood pressure with the goal of decreasing her risk of heart attack and stroke. Malyiah's blood pressure is not currently controlled.  Diabetes II with Stage 1 Chronic Kidney Disease Chirsty has a diagnosis of diabetes type II. Jenavee states she is not checking her BGs at home. She now has a meter at home. She denies any hypoglycemic episodes. Last A1c was 6.8 on 05/10/17. She has been working on intensive lifestyle modifications including diet, exercise, and weight loss to help control her blood glucose levels.  At risk for cardiovascular disease Ivanna is at a higher than average risk for cardiovascular disease due to obesity, hypertension, and diabetes II. She currently denies any chest pain.  Obstructive Sleep Apnea Lorien has a history of obstructive sleep apnea. She has been using the sam CPAP for over 10 years. She admits to daytime somnolence and snoring. She denies apnea, headache (chest pain or dyspnea). She follows up with Dr.  Halford Chessman.  ALLERGIES: Allergies  Allergen Reactions  . Cymbalta [Duloxetine Hcl] Other (See Comments)    Stomach pain  . Gabapentin Itching  . Lipitor [Atorvastatin] Other (See Comments)    Cramps  . Lisinopril Swelling  . Maxzide [Hydrochlorothiazide W-Triamterene] Other (See Comments)    Cramping   . Pravastatin Sodium Other (See Comments)    Headache   . Simvastatin Other (See Comments)    Memory loss  . Lodine [Etodolac] Itching    MEDICATIONS: Current Outpatient Medications on File Prior to Visit  Medication Sig Dispense Refill  . amitriptyline (ELAVIL) 25 MG tablet Take 25 mg by mouth every morning.     [name] aspirin 81 MG tablet Take 81 mg by mouth daily.    . blood glucose meter kit and supplies KIT Dispense based on patient and insurance preference. Use up to four times daily as directed. (FOR ICD-9 250.00, 250.01). 1 each 0  . DUREZOL 0.05 % EMUL Place 1 drop into the left eye daily.  0  . escitalopram (LEXAPRO) 10 MG tablet Take 10 mg by mouth daily.    . ferrous sulfate 325 (65 FE) MG tablet Take 1 tablet (325 mg total) by mouth daily with breakfast. 30 tablet 0  . fluticasone (CUTIVATE) 0.05 % cream Apply 1 application topically 2 (two) times daily.  0  . hydrochlorothiazide (HYDRODIURIL) 25 MG tablet Take 1 tablet (25 mg total) by mouth daily. 30 tablet 0  . irbesartan (AVAPRO) 300 MG tablet Take 1 tablet (300 mg total) by  mouth at bedtime. 30 tablet 0  . ketoconazole (NIZORAL) 2 % cream Apply 1 application topically daily. 15 g 0  . metFORMIN (GLUCOPHAGE) 500 MG tablet take 1 tablet by mouth once daily with BREAKFAST 30 tablet 0  . niacin 500 MG CR capsule Take 500 mg by mouth 2 (two) times daily with a meal.    . Omega-3 Fatty Acids (FISH OIL) 1200 MG CAPS Take 500 mg by mouth daily.     . verapamil (VERELAN PM) 240 MG 24 hr capsule Take 240 mg by mouth daily.     . Vitamin D, Cholecalciferol, 400 UNITS TABS Take 400 Units by mouth at bedtime.    . Vitamin D,  Ergocalciferol, (DRISDOL) 50000 units CAPS capsule Take 1 capsule (50,000 Units total) by mouth every 7 (seven) days. 4 capsule 0   No current facility-administered medications on file prior to visit.     PAST MEDICAL HISTORY: Past Medical History:  Diagnosis Date  . Anemia   . Anxiety    takes Xanax daily  . Arthritis   . Back pain   . Blood transfusion   . Breast cancer ([place]) 2013   left breast  . Bronchitis    hx of  . Chronic fatigue syndrome   . Depression    takes Lexapro daily  . Diabetes mellitus without complication ([place])    boderline  . Diverticulosis   . Edema    feet and legs  . Headache(784.0)   . History of colon polyps 1998   adenomatous  . Hyperlipidemia    takes Niacin daily  . Hypertension    takes Verapamil and Avapro daily  . Joint pain   . OSA (obstructive sleep apnea)   . Personal history of radiation therapy   . Pneumonia 4/12   Albuterol daily as needed  . Radiation 10/29/11-11/24/11   left breast 6100 cGy  . Shortness of breath    with exertion  . Spinal stenosis   . Tinnitus     PAST SURGICAL HISTORY: Past Surgical History:  Procedure Laterality Date  . APPENDECTOMY  1978  . BACK SURGERY    . BELPHAROPTOSIS REPAIR    . BREAST EXCISIONAL BIOPSY Right 02/06/2013  . BREAST LUMPECTOMY  08/2011   left  . BREAST LUMPECTOMY WITH NEEDLE LOCALIZATION Right 02/06/2013   Procedure: RIGHT BREAST LUMPECTOMY WITH NEEDLE LOCALIZATION;  Surgeon: Harl Bowie, MD;  Location: [facility];  Service: General;  Laterality: Right;  . COLONOSCOPY W/ POLYPECTOMY    . COLONOSCOPY WITH PROPOFOL N/A 10/27/2015   Procedure: COLONOSCOPY WITH PROPOFOL;  Surgeon: Mauri Pole, MD;  Location: [facility];  Service: Endoscopy;  Laterality: N/A;  . HARDWARE REMOVAL Left 04/20/2015   Procedure: HARDWARE REMOVAL LEFT LUMBAR FIVE SCREW;  Surgeon: Karie Chimera, MD;  Location: [facility];  Service: Neurosurgery;  Laterality: Left;  . JOINT REPLACEMENT Bilateral     bilateral knee  . POLYPECTOMY  1990's  . THYROID CYST EXCISION  1994  . TOTAL KNEE ARTHROPLASTY  2006   bilateral  . TRANSPHENOIDAL / TRANSNASAL HYPOPHYSECTOMY / RESECTION PITUITARY TUMOR  6/11    SOCIAL HISTORY: Social History   Tobacco Use  . Smoking status: Never Smoker  . Smokeless tobacco: Never Used  Substance Use Topics  . Alcohol use: No  . Drug use: No    FAMILY HISTORY: Family History  Problem Relation Age of Onset  . Heart disease Father   . Clotting disorder Father   . Stroke Father   .  Hypertension Father   . Heart Problems Father   . Stroke Mother   . Diabetes Mother   . Hypertension Mother   . Hyperlipidemia Mother   . Obesity Mother   . Cancer Sister 61       Breast Cancer  . Breast cancer Sister 82  . Colon cancer Neg Hx   . Neuropathy Neg Hx     ROS: Review of Systems  Constitutional: Negative for weight loss.  Respiratory: Negative for shortness of breath.   Cardiovascular: Negative for chest pain.  Neurological: Negative for headaches.    PHYSICAL EXAM: Blood pressure (!) 146/76, pulse 92, temperature 98 F (36.7 C), temperature source Oral, height '5\' 6"'$  (1.676 m), weight 259 lb (117.5 kg), SpO2 99 %. Body mass index is 41.8 kg/m. Physical Exam  Constitutional: [name], place, and time. She appears well-developed and well-nourished.  Cardiovascular: Normal rate.  Pulmonary/Chest: Effort normal.  Musculoskeletal: Normal range of motion.  Neurological: [name], place, and time.  Skin: Skin is warm and dry.  Psychiatric: She has a normal mood and affect. Her behavior is normal.  Vitals reviewed.   RECENT LABS AND TESTS: BMET    Component Value Date/Time   NA 140 08/30/2017 0736   NA 143 05/10/2017 1006   NA 140 06/01/2016 1143   K 3.7 08/30/2017 0736   K 4.0 06/01/2016 1143   CL 106 08/30/2017 0736   CL 105 12/09/2012 1423   CO2 24 08/30/2017 0736   CO2 26 06/01/2016 1143   GLUCOSE 126  08/30/2017 0736   GLUCOSE 121 06/01/2016 1143   GLUCOSE 177 (H) 12/09/2012 1423   BUN 20 08/30/2017 0736   BUN 22 05/10/2017 1006   BUN 12.5 06/01/2016 1143   CREATININE 1.17 (H) 08/30/2017 0736   CREATININE 1.1 06/01/2016 1143   CALCIUM 9.9 08/30/2017 0736   CALCIUM 10.1 06/01/2016 1143   GFRNONAA 45 (L) 08/30/2017 0736   GFRAA 52 (L) 08/30/2017 0736   Lab Results  Component Value Date   HGBA1C 6.8 (H) 05/10/2017   HGBA1C 6.2 (H) 12/14/2016   HGBA1C 6.3 (H) 08/31/2016   HGBA1C (H) 11/11/2010    6.7 (NOTE)                                                                       According to the ADA Clinical Practice Recommendations for 2011, when HbA1c is used as a screening test:   >=6.5%   Diagnostic of Diabetes Mellitus           (if abnormal result  is confirmed)  5.7-6.4%   Increased risk of developing Diabetes Mellitus  References:Diagnosis and Classification of Diabetes Mellitus,Diabetes ZOXW,9604,54(UJWJX 1):S62-S69 and Standards of Medical Care in         Diabetes - 2011,Diabetes Care,2011,34  (Suppl 1):S11-S61.   Lab Results  Component Value Date   INSULIN 10.5 05/10/2017   INSULIN 20.9 12/14/2016   INSULIN 23.5 08/31/2016   CBC    Component Value Date/Time   WBC 7.1 08/30/2017 0736   WBC 14.5 (H) 08/21/2016 2027   RBC 4.05 08/30/2017 0736   HGB 11.0 (L) 05/10/2017 1006   HGB 11.1 (L) 06/01/2016 1143   HCT  32.7 (L) 08/30/2017 0736   HCT 34.4 05/10/2017 1006   HCT 34.9 06/01/2016 1143   PLT 243 08/30/2017 0736   PLT 248 06/01/2016 1143   MCV 80.7 08/30/2017 0736   MCV 80 05/10/2017 1006   MCV 81.2 06/01/2016 1143   MCH 26.3 08/30/2017 0736   MCHC 32.6 08/30/2017 0736   RDW 15.8 (H) 08/30/2017 0736   RDW 16.5 (H) 05/10/2017 1006   RDW 15.8 (H) 06/01/2016 1143   LYMPHSABS 1.8 08/30/2017 0736   LYMPHSABS 1.3 05/10/2017 1006   LYMPHSABS 1.8 06/01/2016 1143   MONOABS 0.5 08/30/2017 0736   MONOABS 0.6 06/01/2016 1143   EOSABS 0.1 08/30/2017 0736   EOSABS 0.1  05/10/2017 1006   BASOSABS 0.0 08/30/2017 0736   BASOSABS 0.0 05/10/2017 1006   BASOSABS 0.0 06/01/2016 1143   Iron/TIBC/Ferritin/ %Sat    Component Value Date/Time   IRON 55 12/14/2016 1146   TIBC 329 12/14/2016 1146   FERRITIN 479 (H) 12/14/2016 1146   FERRITIN 362 (H) 04/15/2015 1058   IRONPCTSAT 17 12/14/2016 1146   Lipid Panel     Component Value Date/Time   CHOL 212 (H) 05/10/2017 1006   TRIG 116 05/10/2017 1006   HDL 44 05/10/2017 1006   CHOLHDL 6.4 11/11/2010 0555   VLDL 17 11/11/2010 0555   LDLCALC 145 (H) 05/10/2017 1006   Hepatic Function Panel     Component Value Date/Time   PROT 7.0 08/30/2017 0736   PROT 6.9 05/10/2017 1006   PROT 7.6 06/01/2016 1143   ALBUMIN 3.3 (L) 08/30/2017 0736   ALBUMIN 3.9 05/10/2017 1006   ALBUMIN 3.2 (L) 06/01/2016 1143   AST 10 08/30/2017 0736   AST 11 06/01/2016 1143   ALT 9 08/30/2017 0736   ALT 12 06/01/2016 1143   ALKPHOS 52 08/30/2017 0736   ALKPHOS 69 06/01/2016 1143   BILITOT 0.4 08/30/2017 0736   BILITOT 0.28 06/01/2016 1143      Component Value Date/Time   TSH 1.710 08/31/2016 1000   TSH 1.570 01/20/2016 1634   TSH 2.028 Test methodology is 3rd generation TSH 11/27/2009 1004    ASSESSMENT AND PLAN: Essential hypertension - Plan: hydrochlorothiazide (HYDRODIURIL) 25 MG tablet  Type 2 diabetes mellitus with stage 1 chronic kidney disease, without long-term current use of insulin ([place]) - Plan: metFORMIN (GLUCOPHAGE) 500 MG tablet  OSA (obstructive sleep apnea)  At risk for heart disease  Class 3 severe obesity with serious comorbidity and body mass index (BMI) of 40.0 to 44.9 in adult, unspecified obesity type ([place])  PLAN:  Hypertension We discussed sodium restriction, working on healthy weight loss, and a regular exercise program as the means to achieve improved blood pressure control. Nadean agreed with this plan and agreed to follow up as directed. We will continue to monitor her blood pressure as well as  her progress with the above lifestyle modifications. Mathilda agrees to continue taking hydrochlorothiazide 25 mg qd #30 and we will refill for 1 month and she will watch for signs of hypotension as she continues her lifestyle modifications. Adalene agrees to follow up with our clinic in 2 weeks.  Diabetes II with Stage 1 Chronic Kidney Disease Avory has been given extensive diabetes education by myself today including ideal fasting and post-prandial blood glucose readings, individual ideal Hgb A1c goals and hypoglycemia prevention. We discussed the importance of good blood sugar control to decrease the likelihood of diabetic complications such as nephropathy, neuropathy, limb loss, blindness, coronary artery disease, and death. We discussed  the importance of intensive lifestyle modification including diet, exercise and weight loss as the first line treatment for diabetes. Kandie agrees to continue taking metformin 500 mg q AM #30 and we will refill for 1 month. Ara agrees to follow up with our clinic in 2 weeks.  Cardiovascular risk counselling Milea was given extended (15 minutes) coronary artery disease prevention counseling today. She is 72 y.o. female and has risk factors for heart disease including obesity, hypertension, and diabetes II. We discussed intensive lifestyle modifications today with an emphasis on specific weight loss instructions and strategies. Pt was also informed of the importance of increasing exercise and decreasing saturated fats to help prevent heart disease.  Obstructive Sleep Apnea Marrissa will follow up with Dr. Halford Chessman for reevaluation of CPAP and possible replacement. Lacrecia agrees to follow up with our clinic in 2 weeks.  Obesity Aleecia is currently in the action stage of change. As such, her goal is to continue with weight loss efforts She has agreed to portion control better and make smarter food choices, such as increase vegetables and decrease simple carbohydrates  Neysa has  been instructed to work up to a goal of 150 minutes of combined cardio and strengthening exercise per week for weight loss and overall health benefits. We discussed the following Behavioral Modification Strategies today: increasing lean protein intake and work on meal planning and easy cooking plans   Analaura has agreed to follow up with our clinic in 2 weeks. She was informed of the importance of frequent follow up visits to maximize her success with intensive lifestyle modifications for her multiple health conditions.   Wilhemena Durie, am acting as transcriptionist for Lacy Duverney, PA-C I, Lacy Duverney [facility], have reviewed this note and agree with its content

## 2017-10-30 ENCOUNTER — Other Ambulatory Visit (INDEPENDENT_AMBULATORY_CARE_PROVIDER_SITE_OTHER): Payer: Self-pay | Admitting: Physician Assistant

## 2017-10-30 DIAGNOSIS — E559 Vitamin D deficiency, unspecified: Secondary | ICD-10-CM

## 2017-11-07 ENCOUNTER — Ambulatory Visit (INDEPENDENT_AMBULATORY_CARE_PROVIDER_SITE_OTHER): Payer: Medicare Other | Admitting: Physician Assistant

## 2017-11-07 VITALS — BP 120/68 | HR 78 | Temp 98.4°F | Ht 66.0 in | Wt 260.0 lb

## 2017-11-07 DIAGNOSIS — E119 Type 2 diabetes mellitus without complications: Secondary | ICD-10-CM | POA: Diagnosis not present

## 2017-11-07 DIAGNOSIS — Z6841 Body Mass Index (BMI) 40.0 and over, adult: Secondary | ICD-10-CM

## 2017-11-07 DIAGNOSIS — I10 Essential (primary) hypertension: Secondary | ICD-10-CM

## 2017-11-07 MED ORDER — METFORMIN HCL 500 MG PO TABS
ORAL_TABLET | ORAL | 0 refills | Status: DC
Start: 2017-11-07 — End: 2017-12-10

## 2017-11-07 NOTE — Progress Notes (Signed)
Office: [phone]  /  Fax: [phone]   HPI:   Chief Complaint: OBESITY [name] is here to discuss her progress with her obesity treatment plan. She is on the portion control better and make smarter food choices, such as increase vegetables and decrease simple carbohydrates  and is following her eating plan approximately 80 % of the time. She states she is exercising 0 minutes 0 times per week. [name] is more mindful of her eating but continues to have challenges pre-planning her meals and is eating out more.  Her weight is 260 lb (117.9 kg) today and has gained 1 pound since her last visit. She has lost 20 lbs since starting treatment with [name].  Diabetes II [name] has a diagnosis of diabetes type II. [name] states she is not checking BGs at home and she denies any hypoglycemic episodes. Last A1c was 6.8 on 05/10/17. She has been working on intensive lifestyle modifications including diet, exercise, and weight loss to help control her blood glucose levels.  Hypertension [name] is a 72 y.o. female with hypertension. [name]'s blood pressure is stable and she denies chest pain or shortness of breath. She is working weight loss to help control her blood pressure with the goal of decreasing her risk of heart attack and stroke. [name]'s blood pressure is currently controlled.  ALLERGIES: Allergies  Allergen Reactions  . Cymbalta [Duloxetine Hcl] Other (See Comments)    Stomach pain  . Gabapentin Itching  . Lipitor [Atorvastatin] Other (See Comments)    Cramps  . Lisinopril Swelling  . Maxzide [Hydrochlorothiazide W-Triamterene] Other (See Comments)    Cramping   . Pravastatin Sodium Other (See Comments)    Headache   . Simvastatin Other (See Comments)    Memory loss  . Lodine [Etodolac] Itching    MEDICATIONS: Current Outpatient Medications on File Prior to Visit  Medication Sig Dispense Refill  . amitriptyline (ELAVIL) 25 MG tablet Take 25 mg by mouth every morning.       [name] aspirin 81 MG tablet Take 81 mg by mouth daily.    . blood glucose meter kit and supplies KIT Dispense based on patient and insurance preference. Use up to four times daily as directed. (FOR ICD-9 250.00, 250.01). 1 each 0  . DUREZOL 0.05 % EMUL Place 1 drop into the left eye daily.  0  . escitalopram (LEXAPRO) 10 MG tablet Take 10 mg by mouth daily.    . ferrous sulfate 325 (65 FE) MG tablet Take 1 tablet (325 mg total) by mouth daily with breakfast. 30 tablet 0  . fluticasone (CUTIVATE) 0.05 % cream Apply 1 application topically 2 (two) times daily.  0  . hydrochlorothiazide (HYDRODIURIL) 25 MG tablet Take 1 tablet (25 mg total) by mouth daily. 30 tablet 0  . hydrOXYzine (ATARAX/VISTARIL) 10 MG tablet Take 10 mg by mouth 3 (three) times daily as needed.    . irbesartan (AVAPRO) 300 MG tablet Take 1 tablet (300 mg total) by mouth at bedtime. 30 tablet 0  . ketoconazole (NIZORAL) 2 % cream Apply 1 application topically daily. 15 g 0  . niacin 500 MG CR capsule Take 500 mg by mouth 2 (two) times daily with a meal.    . Omega-3 Fatty Acids (FISH OIL) 1200 MG CAPS Take 500 mg by mouth daily.     . rosuvastatin (CRESTOR) 5 MG tablet Take 5 mg by mouth at bedtime.    . verapamil (VERELAN PM) 240 MG 24 hr capsule Take 240  mg by mouth daily.     . Vitamin D, Cholecalciferol, 400 UNITS TABS Take 400 Units by mouth at bedtime.    . Vitamin D, Ergocalciferol, (DRISDOL) 50000 units CAPS capsule Take 1 capsule (50,000 Units total) by mouth every 7 (seven) days. 4 capsule 0   No current facility-administered medications on file prior to visit.     PAST MEDICAL HISTORY: Past Medical History:  Diagnosis Date  . Anemia   . Anxiety    takes Xanax daily  . Arthritis   . Back pain   . Blood transfusion   . Breast cancer ([place]) 2013   left breast  . Bronchitis    hx of  . Chronic fatigue syndrome   . Depression    takes Lexapro daily  . Diabetes mellitus without complication ([place])    boderline   . Diverticulosis   . Edema    feet and legs  . Headache(784.0)   . History of colon polyps 1998   adenomatous  . Hyperlipidemia    takes Niacin daily  . Hypertension    takes Verapamil and Avapro daily  . Joint pain   . OSA (obstructive sleep apnea)   . Personal history of radiation therapy   . Pneumonia 4/12   Albuterol daily as needed  . Radiation 10/29/11-11/24/11   left breast 6100 cGy  . Shortness of breath    with exertion  . Spinal stenosis   . Tinnitus     PAST SURGICAL HISTORY: Past Surgical History:  Procedure Laterality Date  . APPENDECTOMY  1978  . BACK SURGERY    . BELPHAROPTOSIS REPAIR    . BREAST EXCISIONAL BIOPSY Right 02/06/2013  . BREAST LUMPECTOMY  08/2011   left  . BREAST LUMPECTOMY WITH NEEDLE LOCALIZATION Right 02/06/2013   Procedure: RIGHT BREAST LUMPECTOMY WITH NEEDLE LOCALIZATION;  Surgeon: Harl Bowie, MD;  Location: [facility];  Service: General;  Laterality: Right;  . COLONOSCOPY W/ POLYPECTOMY    . COLONOSCOPY WITH PROPOFOL N/A 10/27/2015   Procedure: COLONOSCOPY WITH PROPOFOL;  Surgeon: Mauri Pole, MD;  Location: [facility];  Service: Endoscopy;  Laterality: N/A;  . HARDWARE REMOVAL Left 04/20/2015   Procedure: HARDWARE REMOVAL LEFT LUMBAR FIVE SCREW;  Surgeon: Karie Chimera, MD;  Location[facility];  Service: Neurosurgery;  Laterality: Left;  . JOINT REPLACEMENT Bilateral    bilateral knee  . POLYPECTOMY  1990's  . THYROID CYST EXCISION  1994  . TOTAL KNEE ARTHROPLASTY  2006   bilateral  . TRANSPHENOIDAL / TRANSNASAL HYPOPHYSECTOMY / RESECTION PITUITARY TUMOR  6/11    SOCIAL HISTORY: Social History   Tobacco Use  . Smoking status: Never Smoker  . Smokeless tobacco: Never Used  Substance Use Topics  . Alcohol use: No  . Drug use: No    FAMILY HISTORY: Family History  Problem Relation Age of Onset  . Heart disease Father   . Clotting disorder Father   . Stroke Father   . Hypertension Father   . Heart Problems Father    . Stroke Mother   . Diabetes Mother   . Hypertension Mother   . Hyperlipidemia Mother   . Obesity Mother   . Cancer Sister 38       Breast Cancer  . Breast cancer Sister 81  . Colon cancer Neg Hx   . Neuropathy Neg Hx     ROS: Review of Systems  Constitutional: Negative for weight loss.  Respiratory: Negative for shortness of breath.   Cardiovascular:  Negative for chest pain.  Endo/Heme/Allergies:       Negative hypoglycemia    PHYSICAL EXAM: Blood pressure 120/68, pulse 78, temperature 98.4 F (36.9 C), temperature source Oral, height _0  (1.676 m), weight 260 lb (117.9 kg), SpO2 99 %. Body mass index is 41.97 kg/m. Physical Exam  Constitutional: [name], place, and time. She appears well-developed and well-nourished.  Cardiovascular: Normal rate.  Pulmonary/Chest: Effort normal.  Musculoskeletal: Normal range of motion.  Neurological: [name], place, and time.  Skin: Skin is warm and dry.  Psychiatric: She has a normal mood and affect. Her behavior is normal.  Vitals reviewed.   RECENT LABS AND TESTS: BMET    Component Value Date/Time   NA 140 08/30/2017 0736   NA 143 05/10/2017 1006   NA 140 06/01/2016 1143   K 3.7 08/30/2017 0736   K 4.0 06/01/2016 1143   CL 106 08/30/2017 0736   CL 105 12/09/2012 1423   CO2 24 08/30/2017 0736   CO2 26 06/01/2016 1143   GLUCOSE 126 08/30/2017 0736   GLUCOSE 121 06/01/2016 1143   GLUCOSE 177 (H) 12/09/2012 1423   BUN 20 08/30/2017 0736   BUN 22 05/10/2017 1006   BUN 12.5 06/01/2016 1143   CREATININE 1.17 (H) 08/30/2017 0736   CREATININE 1.1 06/01/2016 1143   CALCIUM 9.9 08/30/2017 0736   CALCIUM 10.1 06/01/2016 1143   GFRNONAA 45 (L) 08/30/2017 0736   GFRAA 52 (L) 08/30/2017 0736   Lab Results  Component Value Date   HGBA1C 6.8 (H) 05/10/2017   HGBA1C 6.2 (H) 12/14/2016   HGBA1C 6.3 (H) 08/31/2016   HGBA1C (H) 11/11/2010    6.7 (NOTE)                                                                        According to the ADA Clinical Practice Recommendations for 2011, when HbA1c is used as a screening test:   >=6.5%   Diagnostic of Diabetes Mellitus           (if abnormal result  is confirmed)  5.7-6.4%   Increased risk of developing Diabetes Mellitus  References:Diagnosis and Classification of Diabetes Mellitus,Diabetes YYTK,3546,56(CLEXN 1):S62-S69 and Standards of Medical Care in         Diabetes - 2011,Diabetes Care,2011,34  (Suppl 1):S11-S61.   Lab Results  Component Value Date   INSULIN 10.5 05/10/2017   INSULIN 20.9 12/14/2016   INSULIN 23.5 08/31/2016   CBC    Component Value Date/Time   WBC 7.1 08/30/2017 0736   WBC 14.5 (H) 08/21/2016 2027   RBC 4.05 08/30/2017 0736   HGB 11.0 (L) 05/10/2017 1006   HGB 11.1 (L) 06/01/2016 1143   HCT 32.7 (L) 08/30/2017 0736   HCT 34.4 05/10/2017 1006   HCT 34.9 06/01/2016 1143   PLT 243 08/30/2017 0736   PLT 248 06/01/2016 1143   MCV 80.7 08/30/2017 0736   MCV 80 05/10/2017 1006   MCV 81.2 06/01/2016 1143   MCH 26.3 08/30/2017 0736   MCHC 32.6 08/30/2017 0736   RDW 15.8 (H) 08/30/2017 0736   RDW 16.5 (H) 05/10/2017 1006   RDW 15.8 (H) 06/01/2016 1143   LYMPHSABS 1.8 08/30/2017 0736  LYMPHSABS 1.3 05/10/2017 1006   LYMPHSABS 1.8 06/01/2016 1143   MONOABS 0.5 08/30/2017 0736   MONOABS 0.6 06/01/2016 1143   EOSABS 0.1 08/30/2017 0736   EOSABS 0.1 05/10/2017 1006   BASOSABS 0.0 08/30/2017 0736   BASOSABS 0.0 05/10/2017 1006   BASOSABS 0.0 06/01/2016 1143   Iron/TIBC/Ferritin/ %Sat    Component Value Date/Time   IRON 55 12/14/2016 1146   TIBC 329 12/14/2016 1146   FERRITIN 479 (H) 12/14/2016 1146   FERRITIN 362 (H) 04/15/2015 1058   IRONPCTSAT 17 12/14/2016 1146   Lipid Panel     Component Value Date/Time   CHOL 212 (H) 05/10/2017 1006   TRIG 116 05/10/2017 1006   HDL 44 05/10/2017 1006   CHOLHDL 6.4 11/11/2010 0555   VLDL 17 11/11/2010 0555   LDLCALC 145 (H) 05/10/2017 1006   Hepatic  Function Panel     Component Value Date/Time   PROT 7.0 08/30/2017 0736   PROT 6.9 05/10/2017 1006   PROT 7.6 06/01/2016 1143   ALBUMIN 3.3 (L) 08/30/2017 0736   ALBUMIN 3.9 05/10/2017 1006   ALBUMIN 3.2 (L) 06/01/2016 1143   AST 10 08/30/2017 0736   AST 11 06/01/2016 1143   ALT 9 08/30/2017 0736   ALT 12 06/01/2016 1143   ALKPHOS 52 08/30/2017 0736   ALKPHOS 69 06/01/2016 1143   BILITOT 0.4 08/30/2017 0736   BILITOT 0.28 06/01/2016 1143      Component Value Date/Time   TSH 1.710 08/31/2016 1000   TSH 1.570 01/20/2016 1634   TSH 2.028 Test methodology is 3rd generation TSH 11/27/2009 1004    ASSESSMENT AND PLAN: Type 2 diabetes mellitus without complication, without long-term current use of insulin (HCC) - Plan: metFORMIN (GLUCOPHAGE) 500 MG tablet  Essential hypertension  Class 3 severe obesity with serious comorbidity and body mass index (BMI) of 40.0 to 44.9 in adult, unspecified obesity type ([place])  PLAN:  Diabetes II [name] has been given extensive diabetes education by myself today including ideal fasting and post-prandial blood glucose readings, individual ideal Hgb A1c goals and hypoglycemia prevention. We discussed the importance of good blood sugar control to decrease the likelihood of diabetic complications such as nephropathy, neuropathy, limb loss, blindness, coronary artery disease, and death. We discussed the importance of intensive lifestyle modification including diet, exercise and weight loss as the first line treatment for diabetes. [name] agrees to continue taking metformin 500 mg q AM #30 and we will refill for 1 month. [name] agrees to follow up with our clinic in 2 weeks.  Hypertension We discussed sodium restriction, working on healthy weight loss, and a regular exercise program as the means to achieve improved blood pressure control. [name] agreed with this plan and agreed to follow up as directed. We will continue to monitor her blood pressure as well as her  progress with the above lifestyle modifications. She will continue her medications as prescribed and will watch for signs of hypotension as she continues her lifestyle modifications. [name] agrees to follow up with our clinic in 2 weeks.  Obesity [name] is currently in the action stage of change. As such, her goal is to continue with weight loss efforts She has agreed to portion control better and make smarter food choices, such as increase vegetables and decrease simple carbohydrates  [name] has been instructed to work up to a goal of 150 minutes of combined cardio and strengthening exercise per week for weight loss and overall health benefits. We discussed the following Behavioral Modification Strategies  today: increasing lean protein intake and work on meal planning and easy cooking plans   [place] has agreed to follow up with our clinic in 2 weeks. She was informed of the importance of frequent follow up visits to maximize her success with intensive lifestyle modifications for her multiple health conditions.   [name], am acting as transcriptionist for Lacy Duverney, PA-C I, Lacy Duverney [facility], have reviewed this note and agree with its content

## 2017-11-09 ENCOUNTER — Encounter (INDEPENDENT_AMBULATORY_CARE_PROVIDER_SITE_OTHER): Payer: Medicare Other | Admitting: Ophthalmology

## 2017-11-09 DIAGNOSIS — I10 Essential (primary) hypertension: Secondary | ICD-10-CM | POA: Diagnosis not present

## 2017-11-09 DIAGNOSIS — H35372 Puckering of macula, left eye: Secondary | ICD-10-CM | POA: Diagnosis not present

## 2017-11-09 DIAGNOSIS — H35033 Hypertensive retinopathy, bilateral: Secondary | ICD-10-CM | POA: Diagnosis not present

## 2017-11-09 DIAGNOSIS — H33302 Unspecified retinal break, left eye: Secondary | ICD-10-CM

## 2017-11-09 DIAGNOSIS — H43813 Vitreous degeneration, bilateral: Secondary | ICD-10-CM

## 2017-11-12 ENCOUNTER — Other Ambulatory Visit (INDEPENDENT_AMBULATORY_CARE_PROVIDER_SITE_OTHER): Payer: Self-pay | Admitting: Physician Assistant

## 2017-11-12 DIAGNOSIS — D649 Anemia, unspecified: Secondary | ICD-10-CM

## 2017-11-15 DIAGNOSIS — M79601 Pain in right arm: Secondary | ICD-10-CM | POA: Diagnosis not present

## 2017-11-15 DIAGNOSIS — H20043 Secondary noninfectious iridocyclitis, bilateral: Secondary | ICD-10-CM | POA: Diagnosis not present

## 2017-11-15 DIAGNOSIS — H538 Other visual disturbances: Secondary | ICD-10-CM | POA: Diagnosis not present

## 2017-11-15 DIAGNOSIS — M791 Myalgia, unspecified site: Secondary | ICD-10-CM | POA: Diagnosis not present

## 2017-11-15 DIAGNOSIS — H1820 Unspecified corneal edema: Secondary | ICD-10-CM | POA: Diagnosis not present

## 2017-11-21 ENCOUNTER — Ambulatory Visit (INDEPENDENT_AMBULATORY_CARE_PROVIDER_SITE_OTHER): Payer: Medicare Other | Admitting: Physician Assistant

## 2017-11-21 VITALS — BP 117/72 | HR 68 | Temp 97.9°F | Ht 66.0 in | Wt 260.0 lb

## 2017-11-21 DIAGNOSIS — Z6841 Body Mass Index (BMI) 40.0 and over, adult: Secondary | ICD-10-CM

## 2017-11-21 DIAGNOSIS — E559 Vitamin D deficiency, unspecified: Secondary | ICD-10-CM

## 2017-11-21 DIAGNOSIS — I10 Essential (primary) hypertension: Secondary | ICD-10-CM | POA: Diagnosis not present

## 2017-11-21 MED ORDER — VITAMIN D (ERGOCALCIFEROL) 1.25 MG (50000 UNIT) PO CAPS: 50000.0000 [IU] | ORAL_CAPSULE | ORAL | 0 refills | Status: DC

## 2017-11-22 NOTE — Progress Notes (Signed)
Office: [phone]  /  Fax: [phone]   HPI:   Chief Complaint: OBESITY Bayle is here to discuss her progress with her obesity treatment plan. She is on the portion control better and make smarter food choices, such as increase vegetables and decrease simple carbohydrates  and is following her eating plan approximately 80 % of the time. She states she is exercising 0 minutes 0 times per week. Zineb maintained her weight. She is mindful of her eating and controls her portions. She admits she has not been keeping up with her water intake.  Her weight is 260 lb (117.9 kg) today and has not lost weight since her last visit. She has lost 20 lbs since starting treatment with [name].  Vitamin D Deficiency Tinslee has a diagnosis of vitamin D deficiency. She is currently taking prescription Vit D and denies nausea, vomiting or muscle weakness.  Hypertension Kymber Kosar is a 72 y.o. female with hypertension. Ersel's repeat blood pressure is normal at 117/72 after having a drink of water. She denies chest pain or shortness of breath. She is instructed to keep a blood pressure log at home for review at next visit. She is also instructed to keep up with water intake to avoid dehydration and hypotension. She is working weight loss to help control her blood pressure with the goal of decreasing her risk of heart attack and stroke. Danyka's blood pressure is currently controlled.  ALLERGIES: Allergies  Allergen Reactions  . Cymbalta [Duloxetine Hcl] Other (See Comments)    Stomach pain  . Gabapentin Itching  . Lipitor [Atorvastatin] Other (See Comments)    Cramps  . Lisinopril Swelling  . Maxzide [Hydrochlorothiazide W-Triamterene] Other (See Comments)    Cramping   . Pravastatin Sodium Other (See Comments)    Headache   . Simvastatin Other (See Comments)    Memory loss  . Lodine [Etodolac] Itching    MEDICATIONS: Current Outpatient Medications on File Prior to Visit  Medication Sig  Dispense Refill  . amitriptyline (ELAVIL) 25 MG tablet Take 25 mg by mouth every morning.     [name] aspirin 81 MG tablet Take 81 mg by mouth daily.    . blood glucose meter kit and supplies KIT Dispense based on patient and insurance preference. Use up to four times daily as directed. (FOR ICD-9 250.00, 250.01). 1 each 0  . DUREZOL 0.05 % EMUL Place 1 drop into the left eye daily.  0  . escitalopram (LEXAPRO) 10 MG tablet Take 10 mg by mouth daily.    . ferrous sulfate 325 (65 FE) MG tablet Take 1 tablet (325 mg total) by mouth daily with breakfast. 30 tablet 0  . fluticasone (CUTIVATE) 0.05 % cream Apply 1 application topically 2 (two) times daily.  0  . hydrochlorothiazide (HYDRODIURIL) 25 MG tablet Take 1 tablet (25 mg total) by mouth daily. 30 tablet 0  . hydrOXYzine (ATARAX/VISTARIL) 10 MG tablet Take 10 mg by mouth 3 (three) times daily as needed.    . irbesartan (AVAPRO) 300 MG tablet Take 1 tablet (300 mg total) by mouth at bedtime. 30 tablet 0  . ketoconazole (NIZORAL) 2 % cream Apply 1 application topically daily. 15 g 0  . metFORMIN (GLUCOPHAGE) 500 MG tablet take 1 tablet by mouth once daily with BREAKFAST 30 tablet 0  . niacin 500 MG CR capsule Take 500 mg by mouth 2 (two) times daily with a meal.    . Omega-3 Fatty Acids (FISH OIL) 1200 MG CAPS Take  500 mg by mouth daily.     . verapamil (VERELAN PM) 240 MG 24 hr capsule Take 240 mg by mouth daily.     . Vitamin D, Cholecalciferol, 400 UNITS TABS Take 400 Units by mouth at bedtime.     No current facility-administered medications on file prior to visit.     PAST MEDICAL HISTORY: Past Medical History:  Diagnosis Date  . Anemia   . Anxiety    takes Xanax daily  . Arthritis   . Back pain   . Blood transfusion   . Breast cancer ([place]) 2013   left breast  . Bronchitis    hx of  . Chronic fatigue syndrome   . Depression    takes Lexapro daily  . Diabetes mellitus without complication ([place])    boderline  . Diverticulosis   .  Edema    feet and legs  . Headache(784.0)   . History of colon polyps 1998   adenomatous  . Hyperlipidemia    takes Niacin daily  . Hypertension    takes Verapamil and Avapro daily  . Joint pain   . OSA (obstructive sleep apnea)   . Personal history of radiation therapy   . Pneumonia 4/12   Albuterol daily as needed  . Radiation 10/29/11-11/24/11   left breast 6100 cGy  . Shortness of breath    with exertion  . Spinal stenosis   . Tinnitus     PAST SURGICAL HISTORY: Past Surgical History:  Procedure Laterality Date  . APPENDECTOMY  1978  . BACK SURGERY    . BELPHAROPTOSIS REPAIR    . BREAST EXCISIONAL BIOPSY Right 02/06/2013  . BREAST LUMPECTOMY  08/2011   left  . BREAST LUMPECTOMY WITH NEEDLE LOCALIZATION Right 02/06/2013   Procedure: RIGHT BREAST LUMPECTOMY WITH NEEDLE LOCALIZATION;  Surgeon: Harl Bowie, MD;  Location: [facility];  Service: General;  Laterality: Right;  . COLONOSCOPY W/ POLYPECTOMY    . COLONOSCOPY WITH PROPOFOL N/A 10/27/2015   Procedure: COLONOSCOPY WITH PROPOFOL;  Surgeon: Mauri Pole, MD;  Location: [facility];  Service: Endoscopy;  Laterality: N/A;  . HARDWARE REMOVAL Left 04/20/2015   Procedure: HARDWARE REMOVAL LEFT LUMBAR FIVE SCREW;  Surgeon: Karie Chimera, MD;  Location: [facility];  Service: Neurosurgery;  Laterality: Left;  . JOINT REPLACEMENT Bilateral    bilateral knee  . POLYPECTOMY  1990's  . THYROID CYST EXCISION  1994  . TOTAL KNEE ARTHROPLASTY  2006   bilateral  . TRANSPHENOIDAL / TRANSNASAL HYPOPHYSECTOMY / RESECTION PITUITARY TUMOR  6/11    SOCIAL HISTORY: Social History   Tobacco Use  . Smoking status: Never Smoker  . Smokeless tobacco: Never Used  Substance Use Topics  . Alcohol use: No  . Drug use: No    FAMILY HISTORY: Family History  Problem Relation Age of Onset  . Heart disease Father   . Clotting disorder Father   . Stroke Father   . Hypertension Father   . Heart Problems Father   . Stroke Mother   .  Diabetes Mother   . Hypertension Mother   . Hyperlipidemia Mother   . Obesity Mother   . Cancer Sister 49       Breast Cancer  . Breast cancer Sister 43  . Colon cancer Neg Hx   . Neuropathy Neg Hx     ROS: Review of Systems  Constitutional: Negative for weight loss.  Respiratory: Negative for shortness of breath.   Cardiovascular: Negative for chest pain.  Gastrointestinal: Negative for nausea and vomiting.  Musculoskeletal:       Negative muscle weakness    PHYSICAL EXAM: Blood pressure 117/72, pulse 68, temperature 97.9 F (36.6 C), temperature source Oral, height '5\' 6"'  (1.676 m), weight 260 lb (117.9 kg), SpO2 100 %. Body mass index is 41.97 kg/m. Physical Exam  Constitutional: [name], place, and time. She appears well-developed and well-nourished.  Cardiovascular: Normal rate.  Pulmonary/Chest: Effort normal.  Musculoskeletal: Normal range of motion.  Neurological: [name], place, and time.  Skin: Skin is warm and dry.  Psychiatric: She has a normal mood and affect. Her behavior is normal.  Vitals reviewed.   RECENT LABS AND TESTS: BMET    Component Value Date/Time   NA 140 08/30/2017 0736   NA 143 05/10/2017 1006   NA 140 06/01/2016 1143   K 3.7 08/30/2017 0736   K 4.0 06/01/2016 1143   CL 106 08/30/2017 0736   CL 105 12/09/2012 1423   CO2 24 08/30/2017 0736   CO2 26 06/01/2016 1143   GLUCOSE 126 08/30/2017 0736   GLUCOSE 121 06/01/2016 1143   GLUCOSE 177 (H) 12/09/2012 1423   BUN 20 08/30/2017 0736   BUN 22 05/10/2017 1006   BUN 12.5 06/01/2016 1143   CREATININE 1.17 (H) 08/30/2017 0736   CREATININE 1.1 06/01/2016 1143   CALCIUM 9.9 08/30/2017 0736   CALCIUM 10.1 06/01/2016 1143   GFRNONAA 45 (L) 08/30/2017 0736   GFRAA 52 (L) 08/30/2017 0736   Lab Results  Component Value Date   HGBA1C 6.8 (H) 05/10/2017   HGBA1C 6.2 (H) 12/14/2016   HGBA1C 6.3 (H) 08/31/2016   HGBA1C (H) 11/11/2010    6.7 (NOTE)                                                                        According to the ADA Clinical Practice Recommendations for 2011, when HbA1c is used as a screening test:   >=6.5%   Diagnostic of Diabetes Mellitus           (if abnormal result  is confirmed)  5.7-6.4%   Increased risk of developing Diabetes Mellitus  References:Diagnosis and Classification of Diabetes Mellitus,Diabetes FHLK,5625,63(SLHTD 1):S62-S69 and Standards of Medical Care in         Diabetes - 2011,Diabetes Care,2011,34  (Suppl 1):S11-S61.   Lab Results  Component Value Date   INSULIN 10.5 05/10/2017   INSULIN 20.9 12/14/2016   INSULIN 23.5 08/31/2016   CBC    Component Value Date/Time   WBC 7.1 08/30/2017 0736   WBC 14.5 (H) 08/21/2016 2027   RBC 4.05 08/30/2017 0736   HGB 10.7 (L) 08/30/2017 0736   HGB 11.0 (L) 05/10/2017 1006   HGB 11.1 (L) 06/01/2016 1143   HCT 32.7 (L) 08/30/2017 0736   HCT 34.4 05/10/2017 1006   HCT 34.9 06/01/2016 1143   PLT 243 08/30/2017 0736   PLT 248 06/01/2016 1143   MCV 80.7 08/30/2017 0736   MCV 80 05/10/2017 1006   MCV 81.2 06/01/2016 1143   MCH 26.3 08/30/2017 0736   MCHC 32.6 08/30/2017 0736   RDW 15.8 (H) 08/30/2017 0736   RDW 16.5 (H) 05/10/2017 1006   RDW  15.8 (H) 06/01/2016 1143   LYMPHSABS 1.8 08/30/2017 0736   LYMPHSABS 1.3 05/10/2017 1006   LYMPHSABS 1.8 06/01/2016 1143   MONOABS 0.5 08/30/2017 0736   MONOABS 0.6 06/01/2016 1143   EOSABS 0.1 08/30/2017 0736   EOSABS 0.1 05/10/2017 1006   BASOSABS 0.0 08/30/2017 0736   BASOSABS 0.0 05/10/2017 1006   BASOSABS 0.0 06/01/2016 1143   Iron/TIBC/Ferritin/ %Sat    Component Value Date/Time   IRON 55 12/14/2016 1146   TIBC 329 12/14/2016 1146   FERRITIN 479 (H) 12/14/2016 1146   FERRITIN 362 (H) 04/15/2015 1058   IRONPCTSAT 17 12/14/2016 1146   Lipid Panel     Component Value Date/Time   CHOL 212 (H) 05/10/2017 1006   TRIG 116 05/10/2017 1006   HDL 44 05/10/2017 1006   CHOLHDL 6.4 11/11/2010 0555   VLDL 17  11/11/2010 0555   LDLCALC 145 (H) 05/10/2017 1006   Hepatic Function Panel     Component Value Date/Time   PROT 7.0 08/30/2017 0736   PROT 6.9 05/10/2017 1006   PROT 7.6 06/01/2016 1143   ALBUMIN 3.3 (L) 08/30/2017 0736   ALBUMIN 3.9 05/10/2017 1006   ALBUMIN 3.2 (L) 06/01/2016 1143   AST 10 08/30/2017 0736   AST 11 06/01/2016 1143   ALT 9 08/30/2017 0736   ALT 12 06/01/2016 1143   ALKPHOS 52 08/30/2017 0736   ALKPHOS 69 06/01/2016 1143   BILITOT 0.4 08/30/2017 0736   BILITOT 0.28 06/01/2016 1143      Component Value Date/Time   TSH 1.710 08/31/2016 1000   TSH 1.570 01/20/2016 1634   TSH 2.028 Test methodology is 3rd generation TSH 11/27/2009 1004  Results for TEKESHIA, KLAHR (MRN [id number]) as of 11/22/2017 10:16  Ref. Range 05/10/2017 10:06  Vitamin D, 25-Hydroxy Latest Ref Range: 30.0 - 100.0 ng/mL 39.3    ASSESSMENT AND PLAN: Vitamin D deficiency - Plan: Vitamin D, Ergocalciferol, (DRISDOL) 50000 units CAPS capsule  Essential hypertension  Class 3 severe obesity with serious comorbidity and body mass index (BMI) of 40.0 to 44.9 in adult, unspecified obesity type (HCC)  PLAN:  Vitamin D Deficiency Stewart was informed that low vitamin D levels contributes to fatigue and are associated with obesity, breast, and colon cancer. Marifer agrees to continue taking prescription Vit D '@50' ,000 IU every week #4 and we will refill for 1 month. She will follow up for routine testing of vitamin D, at least 2-3 times per year. She was informed of the risk of over-replacement of vitamin D and agrees to not increase her dose unless she discusses this with [name] first. Damyiah agrees to follow up with our clinic in 2 weeks.  Hypertension We discussed sodium restriction, working on healthy weight loss, and a regular exercise program as the means to achieve improved blood pressure control. Carissa agreed with this plan and agreed to follow up as directed. We will continue to monitor her blood  pressure as well as her progress with the above lifestyle modifications. She will continue her medications as prescribed and will watch for signs of hypotension as she continues her lifestyle modifications. Salia agrees to follow up with our clinic in 2 weeks.  Obesity Corneisha is currently in the action stage of change. As such, her goal is to continue with weight loss efforts She has agreed to portion control better and make smarter food choices, such as increase vegetables and decrease simple carbohydrates  Sheyenne has been instructed to work up to a goal of 150 minutes  of combined cardio and strengthening exercise per week for weight loss and overall health benefits. We discussed the following Behavioral Modification Strategies today: work on meal planning and easy cooking plans and increase H20 intake   Jina has agreed to follow up with our clinic in 2 weeks. She was informed of the importance of frequent follow up visits to maximize her success with intensive lifestyle modifications for her multiple health conditions.   Wilhemena Durie, am acting as transcriptionist for Lacy Duverney, PA-C I, Lacy Duverney [facility], have reviewed this note and agree with its content

## 2017-12-07 DIAGNOSIS — L728 Other follicular cysts of the skin and subcutaneous tissue: Secondary | ICD-10-CM | POA: Diagnosis not present

## 2017-12-07 DIAGNOSIS — D2310 Other benign neoplasm of skin of unspecified eyelid, including canthus: Secondary | ICD-10-CM | POA: Diagnosis not present

## 2017-12-07 DIAGNOSIS — L821 Other seborrheic keratosis: Secondary | ICD-10-CM | POA: Diagnosis not present

## 2017-12-10 ENCOUNTER — Ambulatory Visit (INDEPENDENT_AMBULATORY_CARE_PROVIDER_SITE_OTHER): Payer: Medicare Other | Admitting: Physician Assistant

## 2017-12-10 VITALS — BP 135/76 | HR 68 | Temp 98.1°F | Ht 66.0 in | Wt 263.0 lb

## 2017-12-10 DIAGNOSIS — E66813 Obesity, class 3: Secondary | ICD-10-CM

## 2017-12-10 DIAGNOSIS — Z6841 Body Mass Index (BMI) 40.0 and over, adult: Secondary | ICD-10-CM

## 2017-12-10 DIAGNOSIS — D508 Other iron deficiency anemias: Secondary | ICD-10-CM

## 2017-12-10 DIAGNOSIS — E119 Type 2 diabetes mellitus without complications: Secondary | ICD-10-CM | POA: Diagnosis not present

## 2017-12-10 MED ORDER — METFORMIN HCL 500 MG PO TABS
ORAL_TABLET | ORAL | 0 refills | Status: DC
Start: 2017-12-10 — End: 2018-01-24

## 2017-12-10 MED ORDER — FERROUS SULFATE 325 (65 FE) MG PO TABS: 325.0000 mg | ORAL_TABLET | Freq: Every day | ORAL | 0 refills | Status: DC

## 2017-12-10 NOTE — Progress Notes (Signed)
Office: [phone]  /  Fax: [phone]   HPI:   Chief Complaint: OBESITY [name] is here to discuss her progress with her obesity treatment plan. She is on the portion control better and make smarter food choices plan and is following her eating plan approximately 50 % of the time. She states she is exercising 0 minutes 0 times per week. [name] is worried about her sick father and has not been as mindful of her eating. She requests a longer follow up time, as she will be traveling out of town. Her weight is 263 lb (119.3 kg) today and has had a weight gain of 3 pounds over a period of 2 to 3 weeks since her last visit. She has lost 17 lbs since starting treatment with [name].  Diabetes II without complications on insulin [name] has a diagnosis of diabetes type II. [name] isn't checking blood sugar at home and she denies any hypoglycemic episodes. She has been working on intensive lifestyle modifications including diet, exercise, and weight loss to help control her blood glucose levels.  Iron Deficiency Anemia [name] has a diagnosis of anemia.  She is on iron supplementation and denies chest pain or dyspnea.  ALLERGIES: Allergies  Allergen Reactions  . Cymbalta [Duloxetine Hcl] Other (See Comments)    Stomach pain  . Gabapentin Itching  . Lipitor [Atorvastatin] Other (See Comments)    Cramps  . Lisinopril Swelling  . Maxzide [Hydrochlorothiazide W-Triamterene] Other (See Comments)    Cramping   . Pravastatin Sodium Other (See Comments)    Headache   . Simvastatin Other (See Comments)    Memory loss  . Lodine [Etodolac] Itching    MEDICATIONS: Current Outpatient Medications on File Prior to Visit  Medication Sig Dispense Refill  . amitriptyline (ELAVIL) 25 MG tablet Take 25 mg by mouth every morning.     [name] aspirin 81 MG tablet Take 81 mg by mouth daily.    . blood glucose meter kit and supplies KIT Dispense based on patient and insurance preference. Use up to four times daily as  directed. (FOR ICD-9 250.00, 250.01). 1 each 0  . DUREZOL 0.05 % EMUL Place 1 drop into the left eye daily.  0  . escitalopram (LEXAPRO) 10 MG tablet Take 10 mg by mouth daily.    . fluticasone (CUTIVATE) 0.05 % cream Apply 1 application topically 2 (two) times daily.  0  . hydrochlorothiazide (HYDRODIURIL) 25 MG tablet Take 1 tablet (25 mg total) by mouth daily. 30 tablet 0  . hydrOXYzine (ATARAX/VISTARIL) 10 MG tablet Take 10 mg by mouth 3 (three) times daily as needed.    . irbesartan (AVAPRO) 300 MG tablet Take 1 tablet (300 mg total) by mouth at bedtime. 30 tablet 0  . ketoconazole (NIZORAL) 2 % cream Apply 1 application topically daily. 15 g 0  . niacin 500 MG CR capsule Take 500 mg by mouth 2 (two) times daily with a meal.    . Omega-3 Fatty Acids (FISH OIL) 1200 MG CAPS Take 500 mg by mouth daily.     . verapamil (VERELAN PM) 240 MG 24 hr capsule Take 240 mg by mouth daily.     . Vitamin D, Cholecalciferol, 400 UNITS TABS Take 400 Units by mouth at bedtime.    . Vitamin D, Ergocalciferol, (DRISDOL) 50000 units CAPS capsule Take 1 capsule (50,000 Units total) by mouth every 7 (seven) days. 4 capsule 0   No current facility-administered medications on file prior to visit.  PAST MEDICAL HISTORY: Past Medical History:  Diagnosis Date  . Anemia   . Anxiety    takes Xanax daily  . Arthritis   . Back pain   . Blood transfusion   . Breast cancer ([place]) 2013   left breast  . Bronchitis    hx of  . Chronic fatigue syndrome   . Depression    takes Lexapro daily  . Diabetes mellitus without complication ([place])    boderline  . Diverticulosis   . Edema    feet and legs  . Headache(784.0)   . History of colon polyps 1998   adenomatous  . Hyperlipidemia    takes Niacin daily  . Hypertension    takes Verapamil and Avapro daily  . Joint pain   . OSA (obstructive sleep apnea)   . Personal history of radiation therapy   . Pneumonia 4/12   Albuterol daily as needed  . Radiation  10/29/11-11/24/11   left breast 6100 cGy  . Shortness of breath    with exertion  . Spinal stenosis   . Tinnitus     PAST SURGICAL HISTORY: Past Surgical History:  Procedure Laterality Date  . APPENDECTOMY  1978  . BACK SURGERY    . BELPHAROPTOSIS REPAIR    . BREAST EXCISIONAL BIOPSY Right 02/06/2013  . BREAST LUMPECTOMY  08/2011   left  . BREAST LUMPECTOMY WITH NEEDLE LOCALIZATION Right 02/06/2013   Procedure: RIGHT BREAST LUMPECTOMY WITH NEEDLE LOCALIZATION;  Surgeon: Harl Bowie, MD;  Location: [facility];  Service: General;  Laterality: Right;  . COLONOSCOPY W/ POLYPECTOMY    . COLONOSCOPY WITH PROPOFOL N/A 10/27/2015   Procedure: COLONOSCOPY WITH PROPOFOL;  Surgeon: Mauri Pole, MD;  Location: [facility];  Service: Endoscopy;  Laterality: N/A;  . HARDWARE REMOVAL Left 04/20/2015   Procedure: HARDWARE REMOVAL LEFT LUMBAR FIVE SCREW;  Surgeon: Karie Chimera, MD;  Location: [facility];  Service: Neurosurgery;  Laterality: Left;  . JOINT REPLACEMENT Bilateral    bilateral knee  . POLYPECTOMY  1990's  . THYROID CYST EXCISION  1994  . TOTAL KNEE ARTHROPLASTY  2006   bilateral  . TRANSPHENOIDAL / TRANSNASAL HYPOPHYSECTOMY / RESECTION PITUITARY TUMOR  6/11    SOCIAL HISTORY: Social History   Tobacco Use  . Smoking status: Never Smoker  . Smokeless tobacco: Never Used  Substance Use Topics  . Alcohol use: No  . Drug use: No    FAMILY HISTORY: Family History  Problem Relation Age of Onset  . Heart disease Father   . Clotting disorder Father   . Stroke Father   . Hypertension Father   . Heart Problems Father   . Stroke Mother   . Diabetes Mother   . Hypertension Mother   . Hyperlipidemia Mother   . Obesity Mother   . Cancer Sister 40       Breast Cancer  . Breast cancer Sister 14  . Colon cancer Neg Hx   . Neuropathy Neg Hx     ROS: Review of Systems  Constitutional: Negative for weight loss.  Respiratory: Negative for shortness of breath.     Cardiovascular: Negative for chest pain.  Endo/Heme/Allergies:       Negative for hypoglycemia    PHYSICAL EXAM: Blood pressure 135/76, pulse 68, temperature 98.1 F (36.7 C), temperature source Oral, height '5\' 6"'  (1.676 m), weight 263 lb (119.3 kg), SpO2 100 %. Body mass index is 42.45 kg/m. Physical Exam  Constitutional: [name], place, and  time. She appears well-developed and well-nourished.  Cardiovascular: Normal rate.  Pulmonary/Chest: Effort normal.  Musculoskeletal: Normal range of motion.  Neurological: [name], place, and time.  Skin: Skin is warm and dry.  Psychiatric: She has a normal mood and affect. Her behavior is normal.  Vitals reviewed.   RECENT LABS AND TESTS: BMET    Component Value Date/Time   NA 140 08/30/2017 0736   NA 143 05/10/2017 1006   NA 140 06/01/2016 1143   K 3.7 08/30/2017 0736   K 4.0 06/01/2016 1143   CL 106 08/30/2017 0736   CL 105 12/09/2012 1423   CO2 24 08/30/2017 0736   CO2 26 06/01/2016 1143   GLUCOSE 126 08/30/2017 0736   GLUCOSE 121 06/01/2016 1143   GLUCOSE 177 (H) 12/09/2012 1423   BUN 20 08/30/2017 0736   BUN 22 05/10/2017 1006   BUN 12.5 06/01/2016 1143   CREATININE 1.17 (H) 08/30/2017 0736   CREATININE 1.1 06/01/2016 1143   CALCIUM 9.9 08/30/2017 0736   CALCIUM 10.1 06/01/2016 1143   GFRNONAA 45 (L) 08/30/2017 0736   GFRAA 52 (L) 08/30/2017 0736   Lab Results  Component Value Date   HGBA1C 6.8 (H) 05/10/2017   HGBA1C 6.2 (H) 12/14/2016   HGBA1C 6.3 (H) 08/31/2016   HGBA1C (H) 11/11/2010    6.7 (NOTE)                                                                       According to the ADA Clinical Practice Recommendations for 2011, when HbA1c is used as a screening test:   >=6.5%   Diagnostic of Diabetes Mellitus           (if abnormal result  is confirmed)  5.7-6.4%   Increased risk of developing Diabetes Mellitus  References:Diagnosis and Classification of Diabetes  [place],Diabetes ZOXW,9604,54(UJWJX 1):S62-S69 and Standards of Medical Care in         Diabetes - 2011,Diabetes Care,2011,34  (Suppl 1):S11-S61.   Lab Results  Component Value Date   INSULIN 10.5 05/10/2017   INSULIN 20.9 12/14/2016   INSULIN 23.5 08/31/2016   CBC    Component Value Date/Time   WBC 7.1 08/30/2017 0736   WBC 14.5 (H) 08/21/2016 2027   RBC 4.05 08/30/2017 0736   HGB 10.7 (L) 08/30/2017 0736   HGB 11.0 (L) 05/10/2017 1006   HGB 11.1 (L) 06/01/2016 1143   HCT 32.7 (L) 08/30/2017 0736   HCT 34.4 05/10/2017 1006   HCT 34.9 06/01/2016 1143   PLT 243 08/30/2017 0736   PLT 248 06/01/2016 1143   MCV 80.7 08/30/2017 0736   MCV 80 05/10/2017 1006   MCV 81.2 06/01/2016 1143   MCH 26.3 08/30/2017 0736   MCHC 32.6 08/30/2017 0736   RDW 15.8 (H) 08/30/2017 0736   RDW 16.5 (H) 05/10/2017 1006   RDW 15.8 (H) 06/01/2016 1143   LYMPHSABS 1.8 08/30/2017 0736   LYMPHSABS 1.3 05/10/2017 1006   LYMPHSABS 1.8 06/01/2016 1143   MONOABS 0.5 08/30/2017 0736   MONOABS 0.6 06/01/2016 1143   EOSABS 0.1 08/30/2017 0736   EOSABS 0.1 05/10/2017 1006   BASOSABS 0.0 08/30/2017 0736   BASOSABS 0.0 05/10/2017 1006   BASOSABS 0.0 06/01/2016 1143  Iron/TIBC/Ferritin/ %Sat    Component Value Date/Time   IRON 55 12/14/2016 1146   TIBC 329 12/14/2016 1146   FERRITIN 479 (H) 12/14/2016 1146   FERRITIN 362 (H) 04/15/2015 1058   IRONPCTSAT 17 12/14/2016 1146   Lipid Panel     Component Value Date/Time   CHOL 212 (H) 05/10/2017 1006   TRIG 116 05/10/2017 1006   HDL 44 05/10/2017 1006   CHOLHDL 6.4 11/11/2010 0555   VLDL 17 11/11/2010 0555   LDLCALC 145 (H) 05/10/2017 1006   Hepatic Function Panel     Component Value Date/Time   PROT 7.0 08/30/2017 0736   PROT 6.9 05/10/2017 1006   PROT 7.6 06/01/2016 1143   ALBUMIN 3.3 (L) 08/30/2017 0736   ALBUMIN 3.9 05/10/2017 1006   ALBUMIN 3.2 (L) 06/01/2016 1143   AST 10 08/30/2017 0736   AST 11 06/01/2016 1143   ALT 9 08/30/2017  0736   ALT 12 06/01/2016 1143   ALKPHOS 52 08/30/2017 0736   ALKPHOS 69 06/01/2016 1143   BILITOT 0.4 08/30/2017 0736   BILITOT 0.28 06/01/2016 1143      Component Value Date/Time   TSH 1.710 08/31/2016 1000   TSH 1.570 01/20/2016 1634   TSH 2.028 Test methodology is 3rd generation TSH 11/27/2009 1004   Results for IYANNAH, BLAKE (MRN [id number]) as of 12/10/2017 17:11  Ref. Range 05/10/2017 10:06  Vitamin D, 25-Hydroxy Latest Ref Range: 30.0 - 100.0 ng/mL 39.3   ASSESSMENT AND PLAN: Type 2 diabetes mellitus without complication, without long-term current use of insulin (HCC) - Plan: metFORMIN (GLUCOPHAGE) 500 MG tablet  Other iron deficiency anemia  Class 3 severe obesity with serious comorbidity and body mass index (BMI) of 40.0 to 44.9 in adult, unspecified obesity type ([place])  PLAN:  Diabetes II without complications on insulin [name] has been given extensive diabetes education by myself today including ideal fasting and post-prandial blood glucose readings, individual ideal Hgb A1c goals and hypoglycemia prevention. We discussed the importance of good blood sugar control to decrease the likelihood of diabetic complications such as nephropathy, neuropathy, limb loss, blindness, coronary artery disease, and death. We discussed the importance of intensive lifestyle modification including diet, exercise and weight loss as the first line treatment for diabetes. [name] agrees to continue metformin 500 mg qd #30 with no refills and follow up at the agreed upon time.  Iron Deficiency Anemia The diagnosis of Iron deficiency anemia was discussed with [name] and was explained in detail. She was given suggestions of iron rich foods and iron supplement was prescribed for ferrous sulfate 325 (65 FE) mg daily with breakfast #30 with no refills. [name] agrees to follow up with our clinic in 3 weeks.  Obesity [name] is currently in the action stage of change. As such, her goal is to continue with  weight loss efforts She has agreed to portion control better and make smarter food choices, such as increase vegetables and decrease simple carbohydrates  [name] has been instructed to work up to a goal of 150 minutes of combined cardio and strengthening exercise per week for weight loss and overall health benefits. We discussed the following Behavioral Modification Strategies today: decrease junk food and planning for success  [name] has agreed to follow up with our clinic in 3 weeks. She was informed of the importance of frequent follow up visits to maximize her success with intensive lifestyle modifications for her multiple health conditions.  Corey Skains, am acting as transcriptionist for [employer], PA-C I,  Lacy Duverney PAC, have reviewed this note and agree with its content

## 2017-12-21 ENCOUNTER — Ambulatory Visit: Payer: Medicare Other | Admitting: Pulmonary Disease

## 2018-01-07 ENCOUNTER — Ambulatory Visit (INDEPENDENT_AMBULATORY_CARE_PROVIDER_SITE_OTHER): Payer: Medicare Other | Admitting: Physician Assistant

## 2018-01-07 VITALS — BP 153/78 | HR 85 | Temp 98.1°F | Ht 66.0 in | Wt 260.0 lb

## 2018-01-07 DIAGNOSIS — I10 Essential (primary) hypertension: Secondary | ICD-10-CM

## 2018-01-07 DIAGNOSIS — E559 Vitamin D deficiency, unspecified: Secondary | ICD-10-CM | POA: Diagnosis not present

## 2018-01-07 DIAGNOSIS — Z6841 Body Mass Index (BMI) 40.0 and over, adult: Secondary | ICD-10-CM | POA: Diagnosis not present

## 2018-01-07 MED ORDER — HYDROCHLOROTHIAZIDE 25 MG PO TABS: 25.0000 mg | ORAL_TABLET | Freq: Every day | ORAL | 0 refills | Status: DC

## 2018-01-07 MED ORDER — VITAMIN D (ERGOCALCIFEROL) 1.25 MG (50000 UNIT) PO CAPS: 50000.0000 [IU] | ORAL_CAPSULE | ORAL | 0 refills | Status: DC

## 2018-01-07 NOTE — Progress Notes (Signed)
Office: [phone]  /  Fax: [phone]   HPI:   Chief Complaint: OBESITY [name] is here to discuss her progress with her obesity treatment plan. She is on the portion control better and make smarter food choices, such as increase vegetables and decrease simple carbohydrates and is following her eating plan approximately 0 % of the time. She states she is exercising 0 minutes 0 times per week. [name] continues to do well with weight loss. She was out of town but managed to [employer] choices and control her portions.  Her weight is 260 lb (117.9 kg) today and has had a weight loss of 3 pounds over a period of 4 weeks since her last visit. She has lost 20 lbs since starting treatment with [name].  Hypertension [name] is a 72 y.o. female with hypertension. [name] denies chest pain or shortness of breath. She is working weight loss to help control her blood pressure with the goal of decreasing her risk of heart attack and stroke. [name]'s blood pressure is not currently controlled.  Vitamin D Deficiency [name] has a diagnosis of vitamin D deficiency. She is currently taking prescription Vit D and denies nausea, vomiting or muscle weakness.  ALLERGIES: Allergies  Allergen Reactions  . Cymbalta [Duloxetine Hcl] Other (See Comments)    Stomach pain  . Gabapentin Itching  . Lipitor [Atorvastatin] Other (See Comments)    Cramps  . Lisinopril Swelling  . Maxzide [Hydrochlorothiazide W-Triamterene] Other (See Comments)    Cramping   . Pravastatin Sodium Other (See Comments)    Headache   . Simvastatin Other (See Comments)    Memory loss  . Lodine [Etodolac] Itching    MEDICATIONS: Current Outpatient Medications on File Prior to Visit  Medication Sig Dispense Refill  . amitriptyline (ELAVIL) 25 MG tablet Take 25 mg by mouth every morning.     [name] aspirin 81 MG tablet Take 81 mg by mouth daily.    . blood glucose meter kit and supplies KIT Dispense based on patient and  insurance preference. Use up to four times daily as directed. (FOR ICD-9 250.00, 250.01). 1 each 0  . DUREZOL 0.05 % EMUL Place 1 drop into the left eye daily.  0  . escitalopram (LEXAPRO) 10 MG tablet Take 10 mg by mouth daily.    . ferrous sulfate 325 (65 FE) MG tablet Take 1 tablet (325 mg total) by mouth daily with breakfast. 30 tablet 0  . fluticasone (CUTIVATE) 0.05 % cream Apply 1 application topically 2 (two) times daily.  0  . hydrochlorothiazide (HYDRODIURIL) 25 MG tablet Take 1 tablet (25 mg total) by mouth daily. 30 tablet 0  . hydrOXYzine (ATARAX/VISTARIL) 10 MG tablet Take 10 mg by mouth 3 (three) times daily as needed.    . irbesartan (AVAPRO) 300 MG tablet Take 1 tablet (300 mg total) by mouth at bedtime. 30 tablet 0  . ketoconazole (NIZORAL) 2 % cream Apply 1 application topically daily. 15 g 0  . metFORMIN (GLUCOPHAGE) 500 MG tablet take 1 tablet by mouth once daily with BREAKFAST 30 tablet 0  . niacin 500 MG CR capsule Take 500 mg by mouth 2 (two) times daily with a meal.    . Omega-3 Fatty Acids (FISH OIL) 1200 MG CAPS Take 500 mg by mouth daily.     . verapamil (VERELAN PM) 240 MG 24 hr capsule Take 240 mg by mouth daily.     . Vitamin D, Cholecalciferol, 400 UNITS TABS Take 400  Units by mouth at bedtime.    . Vitamin D, Ergocalciferol, (DRISDOL) 50000 units CAPS capsule Take 1 capsule (50,000 Units total) by mouth every 7 (seven) days. 4 capsule 0   No current facility-administered medications on file prior to visit.     PAST MEDICAL HISTORY: Past Medical History:  Diagnosis Date  . Anemia   . Anxiety    takes Xanax daily  . Arthritis   . Back pain   . Blood transfusion   . Breast cancer ([place]) 2013   left breast  . Bronchitis    hx of  . Chronic fatigue syndrome   . Depression    takes Lexapro daily  . Diabetes mellitus without complication ([place])    boderline  . Diverticulosis   . Edema    feet and legs  . Headache(784.0)   . History of colon polyps 1998    adenomatous  . Hyperlipidemia    takes Niacin daily  . Hypertension    takes Verapamil and Avapro daily  . Joint pain   . OSA (obstructive sleep apnea)   . Personal history of radiation therapy   . Pneumonia 4/12   Albuterol daily as needed  . Radiation 10/29/11-11/24/11   left breast 6100 cGy  . Shortness of breath    with exertion  . Spinal stenosis   . Tinnitus     PAST SURGICAL HISTORY: Past Surgical History:  Procedure Laterality Date  . APPENDECTOMY  1978  . BACK SURGERY    . BELPHAROPTOSIS REPAIR    . BREAST EXCISIONAL BIOPSY Right 02/06/2013  . BREAST LUMPECTOMY  08/2011   left  . BREAST LUMPECTOMY WITH NEEDLE LOCALIZATION Right 02/06/2013   Procedure: RIGHT BREAST LUMPECTOMY WITH NEEDLE LOCALIZATION;  Surgeon: Harl Bowie, MD;  Location: [place];  Service: General;  Laterality: Right;  . COLONOSCOPY W/ POLYPECTOMY    . COLONOSCOPY WITH PROPOFOL N/A 10/27/2015   Procedure: COLONOSCOPY WITH PROPOFOL;  Surgeon: Mauri Pole, MD;  Location: [facility];  Service: Endoscopy;  Laterality: N/A;  . HARDWARE REMOVAL Left 04/20/2015   Procedure: HARDWARE REMOVAL LEFT LUMBAR FIVE SCREW;  Surgeon: Karie Chimera, MD;  Location: [place];  Service: Neurosurgery;  Laterality: Left;  . JOINT REPLACEMENT Bilateral    bilateral knee  . POLYPECTOMY  1990's  . THYROID CYST EXCISION  1994  . TOTAL KNEE ARTHROPLASTY  2006   bilateral  . TRANSPHENOIDAL / TRANSNASAL HYPOPHYSECTOMY / RESECTION PITUITARY TUMOR  6/11    SOCIAL HISTORY: Social History   Tobacco Use  . Smoking status: Never Smoker  . Smokeless tobacco: Never Used  Substance Use Topics  . Alcohol use: No  . Drug use: No    FAMILY HISTORY: Family History  Problem Relation Age of Onset  . Heart disease Father   . Clotting disorder Father   . Stroke Father   . Hypertension Father   . Heart Problems Father   . Stroke Mother   . Diabetes Mother   . Hypertension Mother   . Hyperlipidemia Mother   .  Obesity Mother   . Cancer Sister 70       Breast Cancer  . Breast cancer Sister 36  . Colon cancer Neg Hx   . Neuropathy Neg Hx     ROS: Review of Systems  Constitutional: Positive for weight loss.  Respiratory: Negative for shortness of breath.   Cardiovascular: Negative for chest pain.  Gastrointestinal: Negative for nausea and vomiting.  Musculoskeletal:  Negative muscle weakness    PHYSICAL EXAM: Blood pressure (!) 153/78, pulse 85, temperature 98.1 F (36.7 C), temperature source Oral, height _0  (1.676 m), weight 260 lb (117.9 kg), SpO2 100 %. Body mass index is 41.97 kg/m. Physical Exam  Constitutional: [name], place, and time. She appears well-developed and well-nourished.  Cardiovascular: Normal rate.  Pulmonary/Chest: Effort normal.  Musculoskeletal: Normal range of motion.  Neurological: [name], place, and time.  Skin: Skin is warm and dry.  Psychiatric: She has a normal mood and affect. Her behavior is normal.  Vitals reviewed.   RECENT LABS AND TESTS: BMET    Component Value Date/Time   NA 140 08/30/2017 0736   NA 143 05/10/2017 1006   NA 140 06/01/2016 1143   K 3.7 08/30/2017 0736   K 4.0 06/01/2016 1143   CL 106 08/30/2017 0736   CL 105 12/09/2012 1423   CO2 24 08/30/2017 0736   CO2 26 06/01/2016 1143   GLUCOSE 126 08/30/2017 0736   GLUCOSE 121 06/01/2016 1143   GLUCOSE 177 (H) 12/09/2012 1423   BUN 20 08/30/2017 0736   BUN 22 05/10/2017 1006   BUN 12.5 06/01/2016 1143   CREATININE 1.17 (H) 08/30/2017 0736   CREATININE 1.1 06/01/2016 1143   CALCIUM 9.9 08/30/2017 0736   CALCIUM 10.1 06/01/2016 1143   GFRNONAA 45 (L) 08/30/2017 0736   GFRAA 52 (L) 08/30/2017 0736   Lab Results  Component Value Date   HGBA1C 6.8 (H) 05/10/2017   HGBA1C 6.2 (H) 12/14/2016   HGBA1C 6.3 (H) 08/31/2016   HGBA1C (H) 11/11/2010    6.7 (NOTE)                                                                        According to the ADA Clinical Practice Recommendations for 2011, when HbA1c is used as a screening test:   >=6.5%   Diagnostic of Diabetes Mellitus           (if abnormal result  is confirmed)  5.7-6.4%   Increased risk of developing Diabetes Mellitus  References:Diagnosis and Classification of Diabetes Mellitus,Diabetes MMNO,1771,16(FBXUX 1):S62-S69 and Standards of Medical Care in         Diabetes - 2011,Diabetes Care,2011,34  (Suppl 1):S11-S61.   Lab Results  Component Value Date   INSULIN 10.5 05/10/2017   INSULIN 20.9 12/14/2016   INSULIN 23.5 08/31/2016   CBC    Component Value Date/Time   WBC 7.1 08/30/2017 0736   WBC 14.5 (H) 08/21/2016 2027   RBC 4.05 08/30/2017 0736   HGB 10.7 (L) 08/30/2017 0736   HGB 11.0 (L) 05/10/2017 1006   HGB 11.1 (L) 06/01/2016 1143   HCT 32.7 (L) 08/30/2017 0736   HCT 34.4 05/10/2017 1006   HCT 34.9 06/01/2016 1143   PLT 243 08/30/2017 0736   PLT 248 06/01/2016 1143   MCV 80.7 08/30/2017 0736   MCV 80 05/10/2017 1006   MCV 81.2 06/01/2016 1143   MCH 26.3 08/30/2017 0736   MCHC 32.6 08/30/2017 0736   RDW 15.8 (H) 08/30/2017 0736   RDW 16.5 (H) 05/10/2017 1006   RDW 15.8 (H) 06/01/2016 1143   LYMPHSABS 1.8 08/30/2017 0736   LYMPHSABS  1.3 05/10/2017 1006   LYMPHSABS 1.8 06/01/2016 1143   MONOABS 0.5 08/30/2017 0736   MONOABS 0.6 06/01/2016 1143   EOSABS 0.1 08/30/2017 0736   EOSABS 0.1 05/10/2017 1006   BASOSABS 0.0 08/30/2017 0736   BASOSABS 0.0 05/10/2017 1006   BASOSABS 0.0 06/01/2016 1143   Iron/TIBC/Ferritin/ %Sat    Component Value Date/Time   IRON 55 12/14/2016 1146   TIBC 329 12/14/2016 1146   FERRITIN 479 (H) 12/14/2016 1146   FERRITIN 362 (H) 04/15/2015 1058   IRONPCTSAT 17 12/14/2016 1146   Lipid Panel     Component Value Date/Time   CHOL 212 (H) 05/10/2017 1006   TRIG 116 05/10/2017 1006   HDL 44 05/10/2017 1006   CHOLHDL 6.4 11/11/2010 0555   VLDL 17 11/11/2010 0555   LDLCALC 145 (H) 05/10/2017 1006   Hepatic  Function Panel     Component Value Date/Time   PROT 7.0 08/30/2017 0736   PROT 6.9 05/10/2017 1006   PROT 7.6 06/01/2016 1143   ALBUMIN 3.3 (L) 08/30/2017 0736   ALBUMIN 3.9 05/10/2017 1006   ALBUMIN 3.2 (L) 06/01/2016 1143   AST 10 08/30/2017 0736   AST 11 06/01/2016 1143   ALT 9 08/30/2017 0736   ALT 12 06/01/2016 1143   ALKPHOS 52 08/30/2017 0736   ALKPHOS 69 06/01/2016 1143   BILITOT 0.4 08/30/2017 0736   BILITOT 0.28 06/01/2016 1143      Component Value Date/Time   TSH 1.710 08/31/2016 1000   TSH 1.570 01/20/2016 1634   TSH 2.028 Test methodology is 3rd generation TSH 11/27/2009 1004  Results for KASSEY, LAFOREST (MRN [id number]) as of 01/07/2018 16:04  Ref. Range 05/10/2017 10:06  Vitamin D, 25-Hydroxy Latest Ref Range: 30.0 - 100.0 ng/mL 39.3    ASSESSMENT AND PLAN: Essential hypertension - Plan: hydrochlorothiazide (HYDRODIURIL) 25 MG tablet  Vitamin D deficiency - Plan: Vitamin D, Ergocalciferol, (DRISDOL) 50000 units CAPS capsule  Class 3 severe obesity with serious comorbidity and body mass index (BMI) of 40.0 to 44.9 in adult, unspecified obesity type (HCC)  PLAN:  Hypertension We discussed sodium restriction, working on healthy weight loss, and a regular exercise program as the means to achieve improved blood pressure control. [name] agreed with this plan and agreed to follow up as directed. We will continue to monitor her blood pressure as well as her progress with the above lifestyle modifications. [name] agrees to continue taking hydrochlorothiazide 25 mg qd #30 and we will refill for 1 month. She will watch for signs of hypotension as she continues her lifestyle modifications. [name] agrees to follow up with our clinic in 2 weeks.  Vitamin D Deficiency [name] was informed that low vitamin D levels contributes to fatigue and are associated with obesity, breast, and colon cancer. [name] agrees to continue taking prescription Vit D _0 ,000 IU every week #4 and we will  refill for 1 month. She will follow up for routine testing of vitamin D, at least 2-3 times per year. She was informed of the risk of over-replacement of vitamin D and agrees to not increase her dose unless she discusses this with [name] first. [name] agrees to follow up with our clinic in 2 weeks.  Obesity [name] is currently in the action stage of change. As such, her goal is to continue with weight loss efforts She has agreed to portion control better and make smarter food choices, such as increase vegetables and decrease simple carbohydrates  [name] has been instructed to work up to a  goal of 150 minutes of combined cardio and strengthening exercise per week for weight loss and overall health benefits. We discussed the following Behavioral Modification Strategies today: work on meal planning and easy cooking plans and planning for success   [name] has agreed to follow up with our clinic in 2 weeks. She was informed of the importance of frequent follow up visits to maximize her success with intensive lifestyle modifications for her multiple health conditions.   [name], am acting as transcriptionist for Lacy Duverney, PA-C I, Lacy Duverney [facility], have reviewed this note and agree with its content

## 2018-01-11 DIAGNOSIS — E7800 Pure hypercholesterolemia, unspecified: Secondary | ICD-10-CM | POA: Diagnosis not present

## 2018-01-11 DIAGNOSIS — E1142 Type 2 diabetes mellitus with diabetic polyneuropathy: Secondary | ICD-10-CM | POA: Diagnosis not present

## 2018-01-11 DIAGNOSIS — M791 Myalgia, unspecified site: Secondary | ICD-10-CM | POA: Diagnosis not present

## 2018-01-11 DIAGNOSIS — I10 Essential (primary) hypertension: Secondary | ICD-10-CM | POA: Diagnosis not present

## 2018-01-11 DIAGNOSIS — N183 Chronic kidney disease, stage 3 (moderate): Secondary | ICD-10-CM | POA: Diagnosis not present

## 2018-01-11 DIAGNOSIS — E559 Vitamin D deficiency, unspecified: Secondary | ICD-10-CM | POA: Diagnosis not present

## 2018-01-11 DIAGNOSIS — D509 Iron deficiency anemia, unspecified: Secondary | ICD-10-CM | POA: Diagnosis not present

## 2018-01-24 ENCOUNTER — Other Ambulatory Visit (INDEPENDENT_AMBULATORY_CARE_PROVIDER_SITE_OTHER): Payer: Self-pay | Admitting: Physician Assistant

## 2018-01-24 ENCOUNTER — Ambulatory Visit (INDEPENDENT_AMBULATORY_CARE_PROVIDER_SITE_OTHER): Payer: Medicare Other | Admitting: Physician Assistant

## 2018-01-24 VITALS — BP 139/75 | HR 72 | Temp 98.2°F | Ht 66.0 in | Wt 263.0 lb

## 2018-01-24 DIAGNOSIS — E119 Type 2 diabetes mellitus without complications: Secondary | ICD-10-CM

## 2018-01-24 DIAGNOSIS — Z6841 Body Mass Index (BMI) 40.0 and over, adult: Secondary | ICD-10-CM | POA: Diagnosis not present

## 2018-01-24 DIAGNOSIS — I10 Essential (primary) hypertension: Secondary | ICD-10-CM

## 2018-01-24 DIAGNOSIS — E559 Vitamin D deficiency, unspecified: Secondary | ICD-10-CM | POA: Diagnosis not present

## 2018-01-24 MED ORDER — HYDROCHLOROTHIAZIDE 25 MG PO TABS: 25.0000 mg | ORAL_TABLET | Freq: Every day | ORAL | 0 refills | Status: DC

## 2018-01-24 MED ORDER — METFORMIN HCL 500 MG PO TABS
ORAL_TABLET | ORAL | 0 refills | Status: DC
Start: 2018-01-24 — End: 2018-02-14

## 2018-01-24 MED ORDER — VITAMIN D (ERGOCALCIFEROL) 1.25 MG (50000 UNIT) PO CAPS: 50000.0000 [IU] | ORAL_CAPSULE | ORAL | 0 refills | Status: DC

## 2018-01-24 NOTE — Progress Notes (Signed)
Office: [phone]  /  Fax: [phone]   HPI:   Chief Complaint: OBESITY [name] is here to discuss her progress with her obesity treatment plan. She is on the portion control better and make smarter food choices, such as increase vegetables and decrease simple carbohydrates and is following her eating plan approximately 50 % of the time. She states she is exercising 0 minutes 0 times per week. [name] continues to have challenges with pre-planning and cooking her meals. She skips meals on occasions and eats out more.  Her weight is 263 lb (119.3 kg) today and has gained 3 pounds since her last visit. She has lost 17 lbs since starting treatment with [name].  Vitamin D Deficiency [name] has a diagnosis of vitamin D deficiency. She is currently taking prescription Vit D and denies nausea, vomiting or muscle weakness.  Diabetes II [name] has a diagnosis of diabetes type II. [place] states she is not checking BGs at home. She denies hypoglycemia. Last A1c was 6.8. She has been working on intensive lifestyle modifications including diet, exercise, and weight loss to help control her blood glucose levels.  Hypertension [name] is a 72 y.o. female with hypertension. Kaile's blood pressure is stable and she denies chest pain or shortness of breath. She is working weight loss to help control her blood pressure with the goal of decreasing her risk of heart attack and stroke. [name]'s blood pressure is currently controlled.  ALLERGIES: Allergies  Allergen Reactions  . Cymbalta [Duloxetine Hcl] Other (See Comments)    Stomach pain  . Gabapentin Itching  . Lipitor [Atorvastatin] Other (See Comments)    Cramps  . Lisinopril Swelling  . Maxzide [Hydrochlorothiazide W-Triamterene] Other (See Comments)    Cramping   . Pravastatin Sodium Other (See Comments)    Headache   . Simvastatin Other (See Comments)    Memory loss  . Lodine [Etodolac] Itching    MEDICATIONS: Current Outpatient  Medications on File Prior to Visit  Medication Sig Dispense Refill  . atorvastatin (LIPITOR) 20 MG tablet Take 20 mg by mouth once a week.    [name] tiZANidine (ZANAFLEX) 2 MG tablet Take 2 mg by mouth at bedtime.    [name] amitriptyline (ELAVIL) 25 MG tablet Take 25 mg by mouth every morning.     [name] aspirin 81 MG tablet Take 81 mg by mouth daily.    . blood glucose meter kit and supplies KIT Dispense based on patient and insurance preference. Use up to four times daily as directed. (FOR ICD-9 250.00, 250.01). 1 each 0  . DUREZOL 0.05 % EMUL Place 1 drop into the left eye daily.  0  . escitalopram (LEXAPRO) 10 MG tablet Take 10 mg by mouth daily.    . ferrous sulfate 325 (65 FE) MG tablet Take 1 tablet (325 mg total) by mouth daily with breakfast. 30 tablet 0  . fluticasone (CUTIVATE) 0.05 % cream Apply 1 application topically 2 (two) times daily.  0  . hydrochlorothiazide (HYDRODIURIL) 25 MG tablet Take 1 tablet (25 mg total) by mouth daily. 30 tablet 0  . hydrOXYzine (ATARAX/VISTARIL) 10 MG tablet Take 10 mg by mouth 3 (three) times daily as needed.    . irbesartan (AVAPRO) 300 MG tablet Take 1 tablet (300 mg total) by mouth at bedtime. 30 tablet 0  . ketoconazole (NIZORAL) 2 % cream Apply 1 application topically daily. 15 g 0  . metFORMIN (GLUCOPHAGE) 500 MG tablet take 1 tablet by mouth once daily with BREAKFAST  30 tablet 0  . niacin 500 MG CR capsule Take 500 mg by mouth 2 (two) times daily with a meal.    . Omega-3 Fatty Acids (FISH OIL) 1200 MG CAPS Take 500 mg by mouth daily.     . verapamil (VERELAN PM) 240 MG 24 hr capsule Take 240 mg by mouth daily.     . Vitamin D, Cholecalciferol, 400 UNITS TABS Take 400 Units by mouth at bedtime.    . Vitamin D, Ergocalciferol, (DRISDOL) 50000 units CAPS capsule Take 1 capsule (50,000 Units total) by mouth every 7 (seven) days. 4 capsule 0   No current facility-administered medications on file prior to visit.     PAST MEDICAL HISTORY: Past Medical  History:  Diagnosis Date  . Anemia   . Anxiety    takes Xanax daily  . Arthritis   . Back pain   . Blood transfusion   . Breast cancer ([place]) 2013   left breast  . Bronchitis    hx of  . Chronic fatigue syndrome   . Depression    takes Lexapro daily  . Diabetes mellitus without complication ([place])    boderline  . Diverticulosis   . Edema    feet and legs  . Headache(784.0)   . History of colon polyps 1998   adenomatous  . Hyperlipidemia    takes Niacin daily  . Hypertension    takes Verapamil and Avapro daily  . Joint pain   . OSA (obstructive sleep apnea)   . Personal history of radiation therapy   . Pneumonia 4/12   Albuterol daily as needed  . Radiation 10/29/11-11/24/11   left breast 6100 cGy  . Shortness of breath    with exertion  . Spinal stenosis   . Tinnitus     PAST SURGICAL HISTORY: Past Surgical History:  Procedure Laterality Date  . APPENDECTOMY  1978  . BACK SURGERY    . BELPHAROPTOSIS REPAIR    . BREAST EXCISIONAL BIOPSY Right 02/06/2013  . BREAST LUMPECTOMY  08/2011   left  . BREAST LUMPECTOMY WITH NEEDLE LOCALIZATION Right 02/06/2013   Procedure: RIGHT BREAST LUMPECTOMY WITH NEEDLE LOCALIZATION;  Surgeon: Harl Bowie, MD;  Location: [facility];  Service: General;  Laterality: Right;  . COLONOSCOPY W/ POLYPECTOMY    . COLONOSCOPY WITH PROPOFOL N/A 10/27/2015   Procedure: COLONOSCOPY WITH PROPOFOL;  Surgeon: Mauri Pole, MD;  Location: [facility];  Service: Endoscopy;  Laterality: N/A;  . HARDWARE REMOVAL Left 04/20/2015   Procedure: HARDWARE REMOVAL LEFT LUMBAR FIVE SCREW;  Surgeon: Karie Chimera, MD;  Location: [facility];  Service: Neurosurgery;  Laterality: Left;  . JOINT REPLACEMENT Bilateral    bilateral knee  . POLYPECTOMY  1990's  . THYROID CYST EXCISION  1994  . TOTAL KNEE ARTHROPLASTY  2006   bilateral  . TRANSPHENOIDAL / TRANSNASAL HYPOPHYSECTOMY / RESECTION PITUITARY TUMOR  6/11    SOCIAL HISTORY: Social History   Tobacco  Use  . Smoking status: Never Smoker  . Smokeless tobacco: Never Used  Substance Use Topics  . Alcohol use: No  . Drug use: No    FAMILY HISTORY: Family History  Problem Relation Age of Onset  . Heart disease Father   . Clotting disorder Father   . Stroke Father   . Hypertension Father   . Heart Problems Father   . Stroke Mother   . Diabetes Mother   . Hypertension Mother   . Hyperlipidemia Mother   . Obesity Mother   .  Cancer Sister 39       Breast Cancer  . Breast cancer Sister 22  . Colon cancer Neg Hx   . Neuropathy Neg Hx     ROS: Review of Systems  Constitutional: Negative for weight loss.  Respiratory: Negative for shortness of breath.   Cardiovascular: Negative for chest pain.  Gastrointestinal: Negative for nausea and vomiting.  Musculoskeletal:       Negative muscle weakness  Endo/Heme/Allergies:       Negative hypoglycemia    PHYSICAL EXAM: Blood pressure 139/75, pulse 72, temperature 98.2 F (36.8 C), temperature source Oral, height '5\' 6"'$  (1.676 m), weight 263 lb (119.3 kg), SpO2 99 %. Body mass index is 42.45 kg/m. Physical Exam  Constitutional: [name], place, and time. She appears well-developed and well-nourished.  Cardiovascular: Normal rate.  Pulmonary/Chest: Effort normal.  Musculoskeletal: Normal range of motion.  Neurological: [name], place, and time.  Skin: Skin is warm and dry.  Psychiatric: She has a normal mood and affect. Her behavior is normal.  Vitals reviewed.   RECENT LABS AND TESTS: BMET    Component Value Date/Time   NA 140 08/30/2017 0736   NA 143 05/10/2017 1006   NA 140 06/01/2016 1143   K 3.7 08/30/2017 0736   K 4.0 06/01/2016 1143   CL 106 08/30/2017 0736   CL 105 12/09/2012 1423   CO2 24 08/30/2017 0736   CO2 26 06/01/2016 1143   GLUCOSE 126 08/30/2017 0736   GLUCOSE 121 06/01/2016 1143   GLUCOSE 177 (H) 12/09/2012 1423   BUN 20 08/30/2017 0736   BUN 22 05/10/2017 1006    BUN 12.5 06/01/2016 1143   CREATININE 1.17 (H) 08/30/2017 0736   CREATININE 1.1 06/01/2016 1143   CALCIUM 9.9 08/30/2017 0736   CALCIUM 10.1 06/01/2016 1143   GFRNONAA 45 (L) 08/30/2017 0736   GFRAA 52 (L) 08/30/2017 0736   Lab Results  Component Value Date   HGBA1C 6.8 (H) 05/10/2017   HGBA1C 6.2 (H) 12/14/2016   HGBA1C 6.3 (H) 08/31/2016   HGBA1C (H) 11/11/2010    6.7 (NOTE)                                                                       According to the ADA Clinical Practice Recommendations for 2011, when HbA1c is used as a screening test:   >=6.5%   Diagnostic of Diabetes Mellitus           (if abnormal result  is confirmed)  5.7-6.4%   Increased risk of developing Diabetes Mellitus  References:Diagnosis and Classification of Diabetes Mellitus,Diabetes ZHYQ,6578,46(NGEXB 1):S62-S69 and Standards of Medical Care in         Diabetes - 2011,Diabetes Care,2011,34  (Suppl 1):S11-S61.   Lab Results  Component Value Date   INSULIN 10.5 05/10/2017   INSULIN 20.9 12/14/2016   INSULIN 23.5 08/31/2016   CBC    Component Value Date/Time   WBC 7.1 08/30/2017 0736   WBC 14.5 (H) 08/21/2016 2027   RBC 4.05 08/30/2017 0736   HGB 10.7 (L) 08/30/2017 0736   HGB 11.0 (L) 05/10/2017 1006   HGB 11.1 (L) 06/01/2016 1143   HCT 32.7 (L) 08/30/2017 0736   HCT  34.4 05/10/2017 1006   HCT 34.9 06/01/2016 1143   PLT 243 08/30/2017 0736   PLT 248 06/01/2016 1143   MCV 80.7 08/30/2017 0736   MCV 80 05/10/2017 1006   MCV 81.2 06/01/2016 1143   MCH 26.3 08/30/2017 0736   MCHC 32.6 08/30/2017 0736   RDW 15.8 (H) 08/30/2017 0736   RDW 16.5 (H) 05/10/2017 1006   RDW 15.8 (H) 06/01/2016 1143   LYMPHSABS 1.8 08/30/2017 0736   LYMPHSABS 1.3 05/10/2017 1006   LYMPHSABS 1.8 06/01/2016 1143   MONOABS 0.5 08/30/2017 0736   MONOABS 0.6 06/01/2016 1143   EOSABS 0.1 08/30/2017 0736   EOSABS 0.1 05/10/2017 1006   BASOSABS 0.0 08/30/2017 0736   BASOSABS 0.0 05/10/2017 1006   BASOSABS 0.0 06/01/2016  1143   Iron/TIBC/Ferritin/ %Sat    Component Value Date/Time   IRON 55 12/14/2016 1146   TIBC 329 12/14/2016 1146   FERRITIN 479 (H) 12/14/2016 1146   FERRITIN 362 (H) 04/15/2015 1058   IRONPCTSAT 17 12/14/2016 1146   Lipid Panel     Component Value Date/Time   CHOL 212 (H) 05/10/2017 1006   TRIG 116 05/10/2017 1006   HDL 44 05/10/2017 1006   CHOLHDL 6.4 11/11/2010 0555   VLDL 17 11/11/2010 0555   LDLCALC 145 (H) 05/10/2017 1006   Hepatic Function Panel     Component Value Date/Time   PROT 7.0 08/30/2017 0736   PROT 6.9 05/10/2017 1006   PROT 7.6 06/01/2016 1143   ALBUMIN 3.3 (L) 08/30/2017 0736   ALBUMIN 3.9 05/10/2017 1006   ALBUMIN 3.2 (L) 06/01/2016 1143   AST 10 08/30/2017 0736   AST 11 06/01/2016 1143   ALT 9 08/30/2017 0736   ALT 12 06/01/2016 1143   ALKPHOS 52 08/30/2017 0736   ALKPHOS 69 06/01/2016 1143   BILITOT 0.4 08/30/2017 0736   BILITOT 0.28 06/01/2016 1143      Component Value Date/Time   TSH 1.710 08/31/2016 1000   TSH 1.570 01/20/2016 1634   TSH 2.028 Test methodology is 3rd generation TSH 11/27/2009 1004  Results for EVELLA, KASAL (MRN [id number]) as of 01/24/2018 10:34  Ref. Range 05/10/2017 10:06  Vitamin D, 25-Hydroxy Latest Ref Range: 30.0 - 100.0 ng/mL 39.3    ASSESSMENT AND PLAN: Vitamin D deficiency - Plan: Vitamin D, Ergocalciferol, (DRISDOL) 50000 units CAPS capsule  Type 2 diabetes mellitus without complication, without long-term current use of insulin ([facility]) - Plan: metFORMIN (GLUCOPHAGE) 500 MG tablet  Essential hypertension - Plan: hydrochlorothiazide (HYDRODIURIL) 25 MG tablet  Class 3 severe obesity with serious comorbidity and body mass index (BMI) of 40.0 to 44.9 in adult, unspecified obesity type ([place])  PLAN:  Vitamin D Deficiency Demisha was informed that low vitamin D levels contributes to fatigue and are associated with obesity, breast, and colon cancer. Dannilynn agrees to continue taking prescription Vit D _0 ,000 IU  every week #4 and we will refill for 1 month. She will follow up for routine testing of vitamin D, at least 2-3 times per year. She was informed of the risk of over-replacement of vitamin D and agrees to not increase her dose unless she discusses this with [name] first. Buena agrees to follow up with our clinic in 2 weeks.  Diabetes II Antanisha has been given extensive diabetes education by myself today including ideal fasting and post-prandial blood glucose readings, individual ideal Hgb A1c goals and hypoglycemia prevention. We discussed the importance of good blood sugar control to decrease the likelihood of diabetic complications such  as nephropathy, neuropathy, limb loss, blindness, coronary artery disease, and death. We discussed the importance of intensive lifestyle modification including diet, exercise and weight loss as the first line treatment for diabetes. Eliese agrees to continue taking metformin 500 mg q AM #30 and we will refill for 1 month. Preslei agrees to follow up with our clinic in 2 weeks.  Hypertension We discussed sodium restriction, working on healthy weight loss, and a regular exercise program as the means to achieve improved blood pressure control. Metzli agreed with this plan and agreed to follow up as directed. We will continue to monitor her blood pressure as well as her progress with the above lifestyle modifications. Travia agrees to continues taking hydrochlorothiazide 25 mg qd #30 and we will refill for 1 month. She will watch for signs of hypotension as she continues her lifestyle modifications. Hagan agrees to follow up with our clinic in 2 weeks.  Obesity Norine is currently in the action stage of change. As such, her goal is to continue with weight loss efforts She has agreed to portion control better and make smarter food choices, such as increase vegetables and decrease simple carbohydrates  Nocole has been instructed to work up to a goal of 150 minutes of combined cardio and  strengthening exercise per week for weight loss and overall health benefits. We discussed the following Behavioral Modification Strategies today: decrease eating out and no skipping meals   Maylen has agreed to follow up with our clinic in 2 weeks. She was informed of the importance of frequent follow up visits to maximize her success with intensive lifestyle modifications for her multiple health conditions.   Wilhemena Durie, am acting as transcriptionist for Lacy Duverney, PA-C I, Lacy Duverney [facility], have reviewed this note and agree with its content

## 2018-02-07 DIAGNOSIS — H02823 Cysts of right eye, unspecified eyelid: Secondary | ICD-10-CM | POA: Diagnosis not present

## 2018-02-07 DIAGNOSIS — H538 Other visual disturbances: Secondary | ICD-10-CM | POA: Diagnosis not present

## 2018-02-07 DIAGNOSIS — H20043 Secondary noninfectious iridocyclitis, bilateral: Secondary | ICD-10-CM | POA: Diagnosis not present

## 2018-02-14 ENCOUNTER — Ambulatory Visit (INDEPENDENT_AMBULATORY_CARE_PROVIDER_SITE_OTHER): Payer: Medicare Other | Admitting: Physician Assistant

## 2018-02-14 VITALS — BP 130/78 | HR 88 | Temp 97.8°F | Ht 66.0 in | Wt 261.0 lb

## 2018-02-14 DIAGNOSIS — I10 Essential (primary) hypertension: Secondary | ICD-10-CM

## 2018-02-14 DIAGNOSIS — E119 Type 2 diabetes mellitus without complications: Secondary | ICD-10-CM

## 2018-02-14 DIAGNOSIS — Z6841 Body Mass Index (BMI) 40.0 and over, adult: Secondary | ICD-10-CM | POA: Diagnosis not present

## 2018-02-14 MED ORDER — METFORMIN HCL 500 MG PO TABS: ORAL_TABLET | ORAL | 0 refills | Status: DC

## 2018-02-14 MED ORDER — HYDROCHLOROTHIAZIDE 25 MG PO TABS: 25.0000 mg | ORAL_TABLET | Freq: Every day | ORAL | 0 refills | Status: DC

## 2018-02-14 NOTE — Progress Notes (Signed)
Office: [phone]  /  Fax: [phone]   HPI:   Chief Complaint: OBESITY [name] is here to discuss her progress with her obesity treatment plan. She is on the portion control better and make smarter food choices, such as increase vegetables and decrease simple carbohydrates and is following her eating plan approximately 90 % of the time. She states she is exercising 0 minutes 0 times per week. [name] continues to do we will with weight loss. She eats out less and is working on increasing her lean protein intake.  Her weight is 261 lb (118.4 kg) today and has had a weight loss of 2 pounds over a period of 3 weeks since her last visit. She has lost 19 lbs since starting treatment with [name].  Hypertension [name] is a 72 y.o. female with hypertension. [name]'s blood pressure is stable and she denies chest pain or shortness of breath. She is working weight loss to help control her blood pressure with the goal of decreasing her risk of heart attack and stroke. [name]'s blood pressure is currently controlled.  Diabetes II [name] has a diagnosis of diabetes type II. [name] states she is not checking her BGs at home. She denies hypoglycemia. Last A1c was 6.8. She has been working on intensive lifestyle modifications including diet, exercise, and weight loss to help control her blood glucose levels.  ALLERGIES: Allergies  Allergen Reactions  . Cymbalta [Duloxetine Hcl] Other (See Comments)    Stomach pain  . Gabapentin Itching  . Lipitor [Atorvastatin] Other (See Comments)    Cramps  . Lisinopril Swelling  . Maxzide [Hydrochlorothiazide W-Triamterene] Other (See Comments)    Cramping   . Pravastatin Sodium Other (See Comments)    Headache   . Simvastatin Other (See Comments)    Memory loss  . Lodine [Etodolac] Itching    MEDICATIONS: Current Outpatient Medications on File Prior to Visit  Medication Sig Dispense Refill  . amitriptyline (ELAVIL) 25 MG tablet Take 25 mg by mouth  every morning.     [name] aspirin 81 MG tablet Take 81 mg by mouth daily.    [name] atorvastatin (LIPITOR) 20 MG tablet Take 20 mg by mouth once a week.    . blood glucose meter kit and supplies KIT Dispense based on patient and insurance preference. Use up to four times daily as directed. (FOR ICD-9 250.00, 250.01). 1 each 0  . DUREZOL 0.05 % EMUL Place 1 drop into the left eye daily.  0  . escitalopram (LEXAPRO) 10 MG tablet Take 10 mg by mouth daily.    . ferrous sulfate 325 (65 FE) MG tablet Take 1 tablet (325 mg total) by mouth daily with breakfast. 30 tablet 0  . fluticasone (CUTIVATE) 0.05 % cream Apply 1 application topically 2 (two) times daily.  0  . hydrochlorothiazide (HYDRODIURIL) 25 MG tablet Take 1 tablet (25 mg total) by mouth daily. 30 tablet 0  . hydrOXYzine (ATARAX/VISTARIL) 10 MG tablet Take 10 mg by mouth 3 (three) times daily as needed.    . irbesartan (AVAPRO) 300 MG tablet Take 1 tablet (300 mg total) by mouth at bedtime. 30 tablet 0  . ketoconazole (NIZORAL) 2 % cream Apply 1 application topically daily. 15 g 0  . metFORMIN (GLUCOPHAGE) 500 MG tablet take 1 tablet by mouth once daily with BREAKFAST 30 tablet 0  . niacin 500 MG CR capsule Take 500 mg by mouth 2 (two) times daily with a meal.    . Omega-3 Fatty Acids (  FISH OIL) 1200 MG CAPS Take 500 mg by mouth daily.     [name] tiZANidine (ZANAFLEX) 2 MG tablet Take 2 mg by mouth at bedtime.    . verapamil (VERELAN PM) 240 MG 24 hr capsule Take 240 mg by mouth daily.     . Vitamin D, Cholecalciferol, 400 UNITS TABS Take 400 Units by mouth at bedtime.    . Vitamin D, Ergocalciferol, (DRISDOL) 50000 units CAPS capsule Take 1 capsule (50,000 Units total) by mouth every 7 (seven) days. 4 capsule 0   No current facility-administered medications on file prior to visit.     PAST MEDICAL HISTORY: Past Medical History:  Diagnosis Date  . Anemia   . Anxiety    takes Xanax daily  . Arthritis   . Back pain   . Blood transfusion   .  Breast cancer ([place]) 2013   left breast  . Bronchitis    hx of  . Chronic fatigue syndrome   . Depression    takes Lexapro daily  . Diabetes mellitus without complication ([place])    boderline  . Diverticulosis   . Edema    feet and legs  . Headache(784.0)   . History of colon polyps 1998   adenomatous  . Hyperlipidemia    takes Niacin daily  . Hypertension    takes Verapamil and Avapro daily  . Joint pain   . OSA (obstructive sleep apnea)   . Personal history of radiation therapy   . Pneumonia 4/12   Albuterol daily as needed  . Radiation 10/29/11-11/24/11   left breast 6100 cGy  . Shortness of breath    with exertion  . Spinal stenosis   . Tinnitus     PAST SURGICAL HISTORY: Past Surgical History:  Procedure Laterality Date  . APPENDECTOMY  1978  . BACK SURGERY    . BELPHAROPTOSIS REPAIR    . BREAST EXCISIONAL BIOPSY Right 02/06/2013  . BREAST LUMPECTOMY  08/2011   left  . BREAST LUMPECTOMY WITH NEEDLE LOCALIZATION Right 02/06/2013   Procedure: RIGHT BREAST LUMPECTOMY WITH NEEDLE LOCALIZATION;  Surgeon: Harl Bowie, MD;  Location: [facility];  Service: General;  Laterality: Right;  . COLONOSCOPY W/ POLYPECTOMY    . COLONOSCOPY WITH PROPOFOL N/A 10/27/2015   Procedure: COLONOSCOPY WITH PROPOFOL;  Surgeon: Mauri Pole, MD;  Location: [facility];  Service: Endoscopy;  Laterality: N/A;  . HARDWARE REMOVAL Left 04/20/2015   Procedure: HARDWARE REMOVAL LEFT LUMBAR FIVE SCREW;  Surgeon: Karie Chimera, MD;  Location: [facility];  Service: Neurosurgery;  Laterality: Left;  . JOINT REPLACEMENT Bilateral    bilateral knee  . POLYPECTOMY  1990's  . THYROID CYST EXCISION  1994  . TOTAL KNEE ARTHROPLASTY  2006   bilateral  . TRANSPHENOIDAL / TRANSNASAL HYPOPHYSECTOMY / RESECTION PITUITARY TUMOR  6/11    SOCIAL HISTORY: Social History   Tobacco Use  . Smoking status: Never Smoker  . Smokeless tobacco: Never Used  Substance Use Topics  . Alcohol use: No  . Drug use: No      FAMILY HISTORY: Family History  Problem Relation Age of Onset  . Heart disease Father   . Clotting disorder Father   . Stroke Father   . Hypertension Father   . Heart Problems Father   . Stroke Mother   . Diabetes Mother   . Hypertension Mother   . Hyperlipidemia Mother   . Obesity Mother   . Cancer Sister 66       Breast  Cancer  . Breast cancer Sister 39  . Colon cancer Neg Hx   . Neuropathy Neg Hx     ROS: Review of Systems  Constitutional: Positive for weight loss.  Respiratory: Negative for shortness of breath.   Cardiovascular: Negative for chest pain.  Endo/Heme/Allergies:       Negative hypoglycemia    PHYSICAL EXAM: Blood pressure 130/78, pulse 88, temperature 97.8 F (36.6 C), temperature source Oral, height _0  (1.676 m), weight 261 lb (118.4 kg), SpO2 100 %. Body mass index is 42.13 kg/m. Physical Exam  Constitutional: [name], place, and time. She appears well-developed and well-nourished.  Cardiovascular: Normal rate.  Pulmonary/Chest: Effort normal.  Musculoskeletal: Normal range of motion.  Neurological: [name], place, and time.  Skin: Skin is warm and dry.  Psychiatric: She has a normal mood and affect. Her behavior is normal.  Vitals reviewed.   RECENT LABS AND TESTS: BMET    Component Value Date/Time   NA 140 08/30/2017 0736   NA 143 05/10/2017 1006   NA 140 06/01/2016 1143   K 3.7 08/30/2017 0736   K 4.0 06/01/2016 1143   CL 106 08/30/2017 0736   CL 105 12/09/2012 1423   CO2 24 08/30/2017 0736   CO2 26 06/01/2016 1143   GLUCOSE 126 08/30/2017 0736   GLUCOSE 121 06/01/2016 1143   GLUCOSE 177 (H) 12/09/2012 1423   BUN 20 08/30/2017 0736   BUN 22 05/10/2017 1006   BUN 12.5 06/01/2016 1143   CREATININE 1.17 (H) 08/30/2017 0736   CREATININE 1.1 06/01/2016 1143   CALCIUM 9.9 08/30/2017 0736   CALCIUM 10.1 06/01/2016 1143   GFRNONAA 45 (L) 08/30/2017 0736   GFRAA 52 (L) 08/30/2017 0736    Lab Results  Component Value Date   HGBA1C 6.8 (H) 05/10/2017   HGBA1C 6.2 (H) 12/14/2016   HGBA1C 6.3 (H) 08/31/2016   HGBA1C (H) 11/11/2010    6.7 (NOTE)                                                                       According to the ADA Clinical Practice Recommendations for 2011, when HbA1c is used as a screening test:   >=6.5%   Diagnostic of Diabetes Mellitus           (if abnormal result  is confirmed)  5.7-6.4%   Increased risk of developing Diabetes Mellitus  References:Diagnosis and Classification of Diabetes Mellitus,Diabetes WPVX,4801,65(VVZSM 1):S62-S69 and Standards of Medical Care in         Diabetes - 2011,Diabetes Care,2011,34  (Suppl 1):S11-S61.   Lab Results  Component Value Date   INSULIN 10.5 05/10/2017   INSULIN 20.9 12/14/2016   INSULIN 23.5 08/31/2016   CBC    Component Value Date/Time   WBC 7.1 08/30/2017 0736   WBC 14.5 (H) 08/21/2016 2027   RBC 4.05 08/30/2017 0736   HGB 10.7 (L) 08/30/2017 0736   HGB 11.0 (L) 05/10/2017 1006   HGB 11.1 (L) 06/01/2016 1143   HCT 32.7 (L) 08/30/2017 0736   HCT 34.4 05/10/2017 1006   HCT 34.9 06/01/2016 1143   PLT 243 08/30/2017 0736   PLT 248 06/01/2016 1143   MCV 80.7 08/30/2017 0736  MCV 80 05/10/2017 1006   MCV 81.2 06/01/2016 1143   MCH 26.3 08/30/2017 0736   MCHC 32.6 08/30/2017 0736   RDW 15.8 (H) 08/30/2017 0736   RDW 16.5 (H) 05/10/2017 1006   RDW 15.8 (H) 06/01/2016 1143   LYMPHSABS 1.8 08/30/2017 0736   LYMPHSABS 1.3 05/10/2017 1006   LYMPHSABS 1.8 06/01/2016 1143   MONOABS 0.5 08/30/2017 0736   MONOABS 0.6 06/01/2016 1143   EOSABS 0.1 08/30/2017 0736   EOSABS 0.1 05/10/2017 1006   BASOSABS 0.0 08/30/2017 0736   BASOSABS 0.0 05/10/2017 1006   BASOSABS 0.0 06/01/2016 1143   Iron/TIBC/Ferritin/ %Sat    Component Value Date/Time   IRON 55 12/14/2016 1146   TIBC 329 12/14/2016 1146   FERRITIN 479 (H) 12/14/2016 1146   FERRITIN 362 (H) 04/15/2015 1058   IRONPCTSAT 17 12/14/2016 1146    Lipid Panel     Component Value Date/Time   CHOL 212 (H) 05/10/2017 1006   TRIG 116 05/10/2017 1006   HDL 44 05/10/2017 1006   CHOLHDL 6.4 11/11/2010 0555   VLDL 17 11/11/2010 0555   LDLCALC 145 (H) 05/10/2017 1006   Hepatic Function Panel     Component Value Date/Time   PROT 7.0 08/30/2017 0736   PROT 6.9 05/10/2017 1006   PROT 7.6 06/01/2016 1143   ALBUMIN 3.3 (L) 08/30/2017 0736   ALBUMIN 3.9 05/10/2017 1006   ALBUMIN 3.2 (L) 06/01/2016 1143   AST 10 08/30/2017 0736   AST 11 06/01/2016 1143   ALT 9 08/30/2017 0736   ALT 12 06/01/2016 1143   ALKPHOS 52 08/30/2017 0736   ALKPHOS 69 06/01/2016 1143   BILITOT 0.4 08/30/2017 0736   BILITOT 0.28 06/01/2016 1143      Component Value Date/Time   TSH 1.710 08/31/2016 1000   TSH 1.570 01/20/2016 1634   TSH 2.028 Test methodology is 3rd generation TSH 11/27/2009 1004    ASSESSMENT AND PLAN: Essential hypertension - Plan: hydrochlorothiazide (HYDRODIURIL) 25 MG tablet  Type 2 diabetes mellitus without complication, without long-term current use of insulin ([place]) - Plan: metFORMIN (GLUCOPHAGE) 500 MG tablet  Class 3 severe obesity with serious comorbidity and body mass index (BMI) of 40.0 to 44.9 in adult, unspecified obesity type ([place])  PLAN:  Hypertension We discussed sodium restriction, working on healthy weight loss, and a regular exercise program as the means to achieve improved blood pressure control. [name] agreed with this plan and agreed to follow up as directed. We will continue to monitor her blood pressure as well as her progress with the above lifestyle modifications. [name] agrees to continue taking hydrochlorothiazide 25 mg qd #30 and we will refill for 1 month. She will watch for signs of hypotension as she continues her lifestyle modifications. [name] agrees to follow up with our clinic in 2 weeks.  Diabetes II [name] has been given extensive diabetes education by myself today including ideal fasting and  post-prandial blood glucose readings, individual ideal Hgb A1c goals  and hypoglycemia prevention. We discussed the importance of good blood sugar control to decrease the likelihood of diabetic complications such as nephropathy, neuropathy, limb loss, blindness, coronary artery disease, and death. We discussed the importance of intensive lifestyle modification including diet, exercise and weight loss as the first line treatment for diabetes. [name] agrees to continue taking metformin 500 mg q AM #30 and we will refill for 1 month. [name] agrees to follow up with our clinic in 2 weeks.  Obesity [name] is currently in the action stage  of change. As such, her goal is to continue with weight loss efforts She has agreed to portion control better and make smarter food choices, such as increase vegetables and decrease simple carbohydrates  [name] has been instructed to work up to a goal of 150 minutes of combined cardio and strengthening exercise per week for weight loss and overall health benefits. We discussed the following Behavioral Modification Strategies today: increasing lean protein intake and work on meal planning and easy cooking plans   [name] has agreed to follow up with our clinic in 2 weeks. She was informed of the importance of frequent follow up visits to maximize her success with intensive lifestyle modifications for her multiple health conditions.   [name], am acting as transcriptionist for Lacy Duverney, PA-C I, Lacy Duverney [facility], have reviewed this note and agree with its content

## 2018-03-07 ENCOUNTER — Ambulatory Visit (INDEPENDENT_AMBULATORY_CARE_PROVIDER_SITE_OTHER): Payer: Medicare Other | Admitting: Physician Assistant

## 2018-03-07 VITALS — BP 150/70 | HR 79 | Temp 98.3°F | Ht 66.0 in | Wt 263.0 lb

## 2018-03-07 DIAGNOSIS — I10 Essential (primary) hypertension: Secondary | ICD-10-CM | POA: Diagnosis not present

## 2018-03-07 DIAGNOSIS — Z6841 Body Mass Index (BMI) 40.0 and over, adult: Secondary | ICD-10-CM

## 2018-03-07 DIAGNOSIS — E559 Vitamin D deficiency, unspecified: Secondary | ICD-10-CM | POA: Diagnosis not present

## 2018-03-07 DIAGNOSIS — E119 Type 2 diabetes mellitus without complications: Secondary | ICD-10-CM | POA: Diagnosis not present

## 2018-03-07 MED ORDER — VITAMIN D (ERGOCALCIFEROL) 1.25 MG (50000 UNIT) PO CAPS: 50000.0000 [IU] | ORAL_CAPSULE | ORAL | 0 refills | Status: DC

## 2018-03-07 MED ORDER — METFORMIN HCL 500 MG PO TABS: ORAL_TABLET | ORAL | 0 refills | Status: DC

## 2018-03-07 MED ORDER — HYDROCHLOROTHIAZIDE 25 MG PO TABS: 25.0000 mg | ORAL_TABLET | Freq: Every day | ORAL | 0 refills | Status: DC

## 2018-03-07 NOTE — Progress Notes (Signed)
Office: [phone]  /  Fax: [phone]   HPI:   Chief Complaint: OBESITY [name] is here to discuss her progress with her obesity treatment plan. She is on the portion control better and make smarter food choices plan and is following her eating plan approximately 80 % of the time. She states she is exercising 0 minutes 0 times per week. [name] had a difficult time getting in all of the food. She reports skipping dinner at times and she also reports eating salad at lunch with a small amount of protein. Her weight is 263 lb (119.3 kg) today and has had a weight gain of 2 pounds over a period of 3 weeks since her last visit. She has lost 17 lbs since starting treatment with [name].  Diabetes II [name] has a diagnosis of diabetes type II. [name] denies any hypoglycemic episodes or polyphagia, She denies nausea, vomiting or diarrhea.. Last A1c was at 6.8 She has been working on intensive lifestyle modifications including diet, exercise, and weight loss to help control her blood glucose levels.  Hypertension [name] is a 72 y.o. female with hypertension.Her [name] is slightly elevated today. [name] denies chest pain. She is working weight loss to help control her [name] with the goal of decreasing her risk of heart attack and stroke. [name] is not currently controlled.  Vitamin D deficiency [name] has a diagnosis of vitamin D deficiency. She is currently taking prescription vit D and denies nausea, vomiting or muscle weakness.  ALLERGIES: Allergies  Allergen Reactions  . Cymbalta [Duloxetine Hcl] Other (See Comments)    Stomach pain  . Gabapentin Itching  . Lipitor [Atorvastatin] Other (See Comments)    Cramps  . Lisinopril Swelling  . Maxzide [Hydrochlorothiazide W-Triamterene] Other (See Comments)    Cramping   . Pravastatin Sodium Other (See Comments)    Headache   . Simvastatin Other (See Comments)    Memory loss  . Lodine [Etodolac]  Itching    MEDICATIONS: Current Outpatient Medications on File Prior to Visit  Medication Sig Dispense Refill  . amitriptyline (ELAVIL) 25 MG tablet Take 25 mg by mouth every morning.     [name] aspirin 81 MG tablet Take 81 mg by mouth daily.    [name] atorvastatin (LIPITOR) 20 MG tablet Take 20 mg by mouth once a week.    . blood glucose meter kit and supplies KIT Dispense based on patient and insurance preference. Use up to four times daily as directed. (FOR ICD-9 250.00, 250.01). 1 each 0  . DUREZOL 0.05 % EMUL Place 1 drop into the left eye daily.  0  . escitalopram (LEXAPRO) 10 MG tablet Take 10 mg by mouth daily.    . ferrous sulfate 325 (65 FE) MG tablet Take 1 tablet (325 mg total) by mouth daily with breakfast. 30 tablet 0  . fluticasone (CUTIVATE) 0.05 % cream Apply 1 application topically 2 (two) times daily.  0  . hydrOXYzine (ATARAX/VISTARIL) 10 MG tablet Take 10 mg by mouth 3 (three) times daily as needed.    . irbesartan (AVAPRO) 300 MG tablet Take 1 tablet (300 mg total) by mouth at bedtime. 30 tablet 0  . ketoconazole (NIZORAL) 2 % cream Apply 1 application topically daily. 15 g 0  . niacin 500 MG CR capsule Take 500 mg by mouth 2 (two) times daily with a meal.    . Omega-3 Fatty Acids (FISH OIL) 1200 MG CAPS Take 500 mg by mouth daily.     [name]  tiZANidine (ZANAFLEX) 2 MG tablet Take 2 mg by mouth at bedtime.    . verapamil (VERELAN PM) 240 MG 24 hr capsule Take 240 mg by mouth daily.     . Vitamin D, Cholecalciferol, 400 UNITS TABS Take 400 Units by mouth at bedtime.     No current facility-administered medications on file prior to visit.     PAST MEDICAL HISTORY: Past Medical History:  Diagnosis Date  . Anemia   . Anxiety    takes Xanax daily  . Arthritis   . Back pain   . Blood transfusion   . Breast cancer ([place]) 2013   left breast  . Bronchitis    hx of  . Chronic fatigue syndrome   . Depression    takes Lexapro daily  . Diabetes mellitus without complication ([place])      boderline  . Diverticulosis   . Edema    feet and legs  . Headache(784.0)   . History of colon polyps 1998   adenomatous  . Hyperlipidemia    takes Niacin daily  . Hypertension    takes Verapamil and Avapro daily  . Joint pain   . OSA (obstructive sleep apnea)   . Personal history of radiation therapy   . Pneumonia 4/12   Albuterol daily as needed  . Radiation 10/29/11-11/24/11   left breast 6100 cGy  . Shortness of breath    with exertion  . Spinal stenosis   . Tinnitus     PAST SURGICAL HISTORY: Past Surgical History:  Procedure Laterality Date  . APPENDECTOMY  1978  . BACK SURGERY    . BELPHAROPTOSIS REPAIR    . BREAST EXCISIONAL BIOPSY Right 02/06/2013  . BREAST LUMPECTOMY  08/2011   left  . BREAST LUMPECTOMY WITH NEEDLE LOCALIZATION Right 02/06/2013   Procedure: RIGHT BREAST LUMPECTOMY WITH NEEDLE LOCALIZATION;  Surgeon: Harl Bowie, MD;  Location: [facility];  Service: General;  Laterality: Right;  . COLONOSCOPY W/ POLYPECTOMY    . COLONOSCOPY WITH PROPOFOL N/A 10/27/2015   Procedure: COLONOSCOPY WITH PROPOFOL;  Surgeon: Mauri Pole, MD;  Location: [facility];  Service: Endoscopy;  Laterality: N/A;  . HARDWARE REMOVAL Left 04/20/2015   Procedure: HARDWARE REMOVAL LEFT LUMBAR FIVE SCREW;  Surgeon: Karie Chimera, MD;  Location: [facility];  Service: Neurosurgery;  Laterality: Left;  . JOINT REPLACEMENT Bilateral    bilateral knee  . POLYPECTOMY  1990's  . THYROID CYST EXCISION  1994  . TOTAL KNEE ARTHROPLASTY  2006   bilateral  . TRANSPHENOIDAL / TRANSNASAL HYPOPHYSECTOMY / RESECTION PITUITARY TUMOR  6/11    SOCIAL HISTORY: Social History   Tobacco Use  . Smoking status: Never Smoker  . Smokeless tobacco: Never Used  Substance Use Topics  . Alcohol use: No  . Drug use: No    FAMILY HISTORY: Family History  Problem Relation Age of Onset  . Heart disease Father   . Clotting disorder Father   . Stroke Father   . Hypertension Father   . Heart  Problems Father   . Stroke Mother   . Diabetes Mother   . Hypertension Mother   . Hyperlipidemia Mother   . Obesity Mother   . Cancer Sister 68       Breast Cancer  . Breast cancer Sister 54  . Colon cancer Neg Hx   . Neuropathy Neg Hx     ROS: Review of Systems  Constitutional: Negative for weight loss.  Cardiovascular: Negative for chest pain.  Gastrointestinal:  Negative for nausea and vomiting.  Musculoskeletal:       Negative for muscle weakness    PHYSICAL EXAM: [name] (!) 150/70, pulse 79, temperature 98.3 F (36.8 C), temperature source Oral, height '5\' 6"'  (1.676 m), weight 263 lb (119.3 kg), SpO2 99 %. Body mass index is 42.45 kg/m. Physical Exam  Constitutional: [name], place, and time. She appears well-developed and well-nourished.  Cardiovascular: Normal rate.  Pulmonary/Chest: Effort normal.  Musculoskeletal: Normal range of motion.  Neurological: [name], place, and time.  Skin: Skin is warm and dry.  Psychiatric: She has a normal mood and affect. Her behavior is normal.  Vitals reviewed.   RECENT LABS AND TESTS: BMET    Component Value Date/Time   NA 140 08/30/2017 0736   NA 143 05/10/2017 1006   NA 140 06/01/2016 1143   K 3.7 08/30/2017 0736   K 4.0 06/01/2016 1143   CL 106 08/30/2017 0736   CL 105 12/09/2012 1423   CO2 24 08/30/2017 0736   CO2 26 06/01/2016 1143   GLUCOSE 126 08/30/2017 0736   GLUCOSE 121 06/01/2016 1143   GLUCOSE 177 (H) 12/09/2012 1423   BUN 20 08/30/2017 0736   BUN 22 05/10/2017 1006   BUN 12.5 06/01/2016 1143   CREATININE 1.17 (H) 08/30/2017 0736   CREATININE 1.1 06/01/2016 1143   CALCIUM 9.9 08/30/2017 0736   CALCIUM 10.1 06/01/2016 1143   GFRNONAA 45 (L) 08/30/2017 0736   GFRAA 52 (L) 08/30/2017 0736   Lab Results  Component Value Date   HGBA1C 6.8 (H) 05/10/2017   HGBA1C 6.2 (H) 12/14/2016   HGBA1C 6.3 (H) 08/31/2016   HGBA1C (H) 11/11/2010    6.7 (NOTE)                                                                        According to the ADA Clinical Practice Recommendations for 2011, when HbA1c is used as a screening test:   >=6.5%   Diagnostic of Diabetes Mellitus           (if abnormal result  is confirmed)  5.7-6.4%   Increased risk of developing Diabetes Mellitus  References:Diagnosis and Classification of Diabetes Mellitus,Diabetes OMBT,5974,16(LAGTX 1):S62-S69 and Standards of Medical Care in         Diabetes - 2011,Diabetes Care,2011,34  (Suppl 1):S11-S61.   Lab Results  Component Value Date   INSULIN 10.5 05/10/2017   INSULIN 20.9 12/14/2016   INSULIN 23.5 08/31/2016   CBC    Component Value Date/Time   WBC 7.1 08/30/2017 0736   WBC 14.5 (H) 08/21/2016 2027   RBC 4.05 08/30/2017 0736   HGB 10.7 (L) 08/30/2017 0736   HGB 11.0 (L) 05/10/2017 1006   HGB 11.1 (L) 06/01/2016 1143   HCT 32.7 (L) 08/30/2017 0736   HCT 34.4 05/10/2017 1006   HCT 34.9 06/01/2016 1143   PLT 243 08/30/2017 0736   PLT 248 06/01/2016 1143   MCV 80.7 08/30/2017 0736   MCV 80 05/10/2017 1006   MCV 81.2 06/01/2016 1143   MCH 26.3 08/30/2017 0736   MCHC 32.6 08/30/2017 0736   RDW 15.8 (H) 08/30/2017 0736   RDW 16.5 (H) 05/10/2017 1006  RDW 15.8 (H) 06/01/2016 1143   LYMPHSABS 1.8 08/30/2017 0736   LYMPHSABS 1.3 05/10/2017 1006   LYMPHSABS 1.8 06/01/2016 1143   MONOABS 0.5 08/30/2017 0736   MONOABS 0.6 06/01/2016 1143   EOSABS 0.1 08/30/2017 0736   EOSABS 0.1 05/10/2017 1006   BASOSABS 0.0 08/30/2017 0736   BASOSABS 0.0 05/10/2017 1006   BASOSABS 0.0 06/01/2016 1143   Iron/TIBC/Ferritin/ %Sat    Component Value Date/Time   IRON 55 12/14/2016 1146   TIBC 329 12/14/2016 1146   FERRITIN 479 (H) 12/14/2016 1146   FERRITIN 362 (H) 04/15/2015 1058   IRONPCTSAT 17 12/14/2016 1146   Lipid Panel     Component Value Date/Time   CHOL 212 (H) 05/10/2017 1006   TRIG 116 05/10/2017 1006   HDL 44 05/10/2017 1006   CHOLHDL 6.4 11/11/2010 0555   VLDL 17  11/11/2010 0555   LDLCALC 145 (H) 05/10/2017 1006   Hepatic Function Panel     Component Value Date/Time   PROT 7.0 08/30/2017 0736   PROT 6.9 05/10/2017 1006   PROT 7.6 06/01/2016 1143   ALBUMIN 3.3 (L) 08/30/2017 0736   ALBUMIN 3.9 05/10/2017 1006   ALBUMIN 3.2 (L) 06/01/2016 1143   AST 10 08/30/2017 0736   AST 11 06/01/2016 1143   ALT 9 08/30/2017 0736   ALT 12 06/01/2016 1143   ALKPHOS 52 08/30/2017 0736   ALKPHOS 69 06/01/2016 1143   BILITOT 0.4 08/30/2017 0736   BILITOT 0.28 06/01/2016 1143    Results for SAHALIE, BETH (MRN [id number]) as of 03/07/2018 15:16  Ref. Range 05/10/2017 10:06  Vitamin D, 25-Hydroxy Latest Ref Range: 30.0 - 100.0 ng/mL 39.3   ASSESSMENT AND PLAN: Essential hypertension - Plan: hydrochlorothiazide (HYDRODIURIL) 25 MG tablet  Vitamin D deficiency - Plan: Vitamin D, Ergocalciferol, (DRISDOL) 50000 units CAPS capsule  Type 2 diabetes mellitus without complication, without long-term current use of insulin (HCC) - Plan: metFORMIN (GLUCOPHAGE) 500 MG tablet  Class 3 severe obesity with serious comorbidity and body mass index (BMI) of 40.0 to 44.9 in adult, unspecified obesity type ([place])  PLAN:  Diabetes II [name] has been given extensive diabetes education by myself today including ideal fasting and post-prandial blood glucose readings, individual ideal Hgb A1c goals and hypoglycemia prevention. We discussed the importance of good blood sugar control to decrease the likelihood of diabetic complications such as nephropathy, neuropathy, limb loss, blindness, coronary artery disease, and death. We discussed the importance of intensive lifestyle modification including diet, exercise and weight loss as the first line treatment for diabetes. [name] agrees to continue metformin 500 mg qd #30 with no refills and  follow up at the agreed upon time. We will check labs at the next visit.  Hypertension We discussed sodium restriction, working on healthy weight  loss, and a regular exercise program as the means to achieve improved [name] control. Akeria agreed with this plan and agreed to follow up as directed. We will check labs at the next visit and will continue to monitor her [name] as well as her progress with the above lifestyle modifications. She agrees to continue HCTZ 25 mg qd #30 with no refills and will watch for signs of hypotension as she continues her lifestyle modifications.  Vitamin D Deficiency [name] was informed that low vitamin D levels contributes to fatigue and are associated with obesity, breast, and colon cancer. She agrees to continue to take prescription Vit D '@50' ,000 IU every week #4 with no refills. We will recheck labs  at the next visit and will follow up for routine testing of vitamin D, at least 2-3 times per year. She was informed of the risk of over-replacement of vitamin D and agrees to not increase her dose unless she discusses this with [name] first. [name] agrees to follow up as directed.  Obesity [name] is currently in the action stage of change. As such, her goal is to continue with weight loss efforts She has agreed to portion control better and make smarter food choices, such as increase vegetables and decrease simple carbohydrates  [name] has been instructed to work up to a goal of 150 minutes of combined cardio and strengthening exercise per week for weight loss and overall health benefits. We discussed the following Behavioral Modification Strategies today: increasing lean protein intake and no skipping meals [name] was given recipe ideas today.  [name] has agreed to follow up with our clinic in 2 to 3 weeks. She was informed of the importance of frequent follow up visits to maximize her success with intensive lifestyle modifications for her multiple health conditions.  Corey Skains, am acting as transcriptionist for [name], PA-C I, [name], PA-C have reviewed above note and agree with its  content

## 2018-03-10 IMAGING — CT CT RENAL STONE PROTOCOL
2 of 4 series · 16 of 46 positions shown, 18 images · non-contrast
Comparison: MRI of the lumbar spine performed 08/23/2015

CLINICAL DATA: Acute onset of right flank pain.  Initial encounter.

EXAM:
CT ABDOMEN AND PELVIS WITHOUT CONTRAST
TECHNIQUE: Multidetector CT imaging of the abdomen and pelvis was performed
following the standard protocol without IV contrast.

[Series 2: axial st · axial · 0.98mm/px · z∈[-295,+125]mm · 13 of 92 slices shown, 15 images]
[im 4/92  soft-tissue]
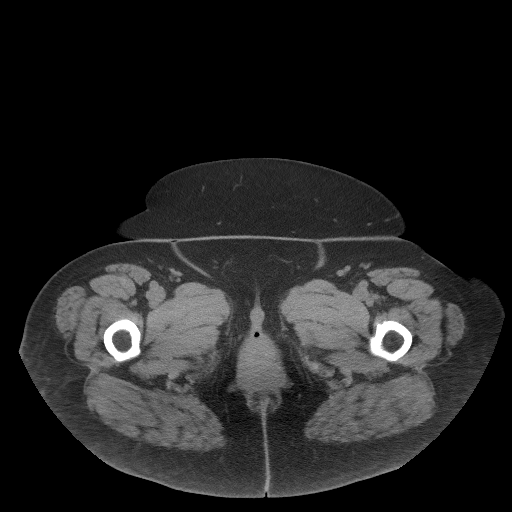
[im 4/92  bone]
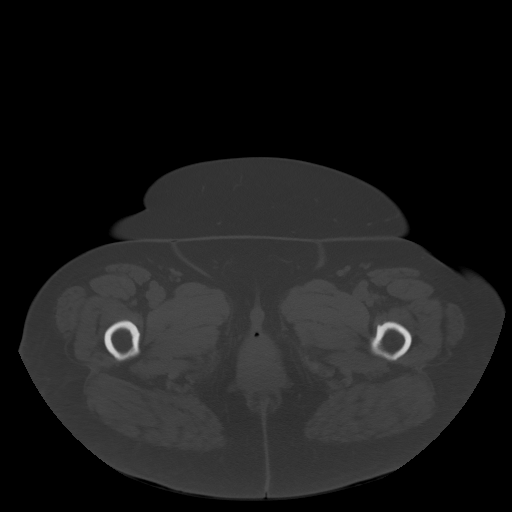
[im 12/92  soft-tissue]
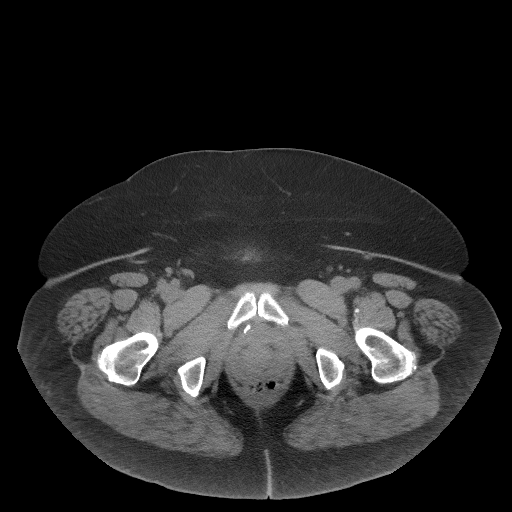
[im 20/92  soft-tissue]
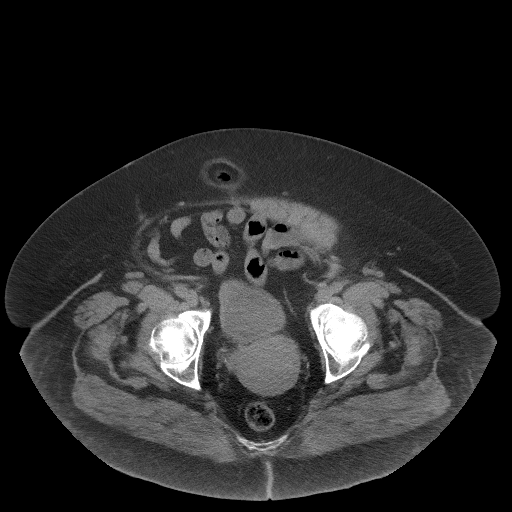
[im 24/92  soft-tissue]
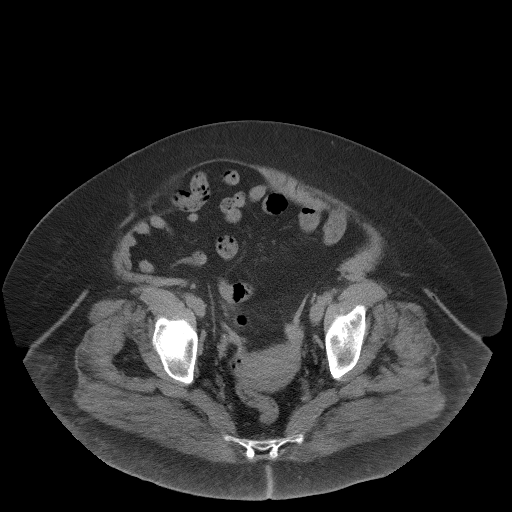
[im 32/92  soft-tissue]
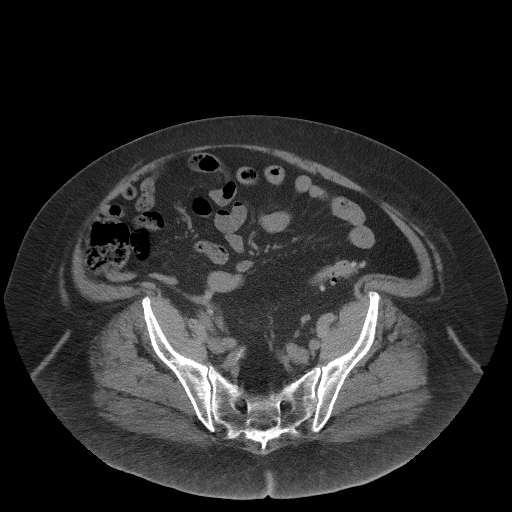
[im 40/92  soft-tissue]
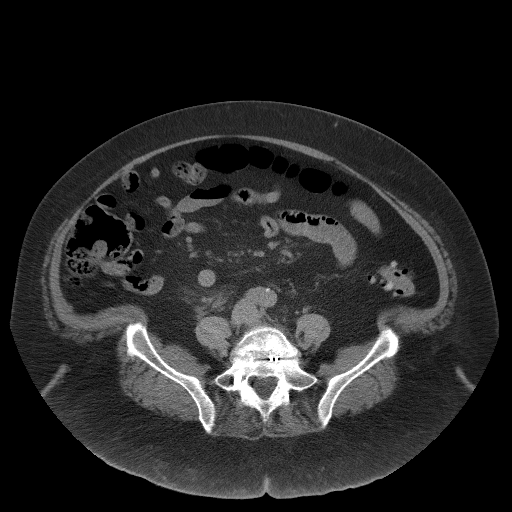
[im 48/92  soft-tissue]
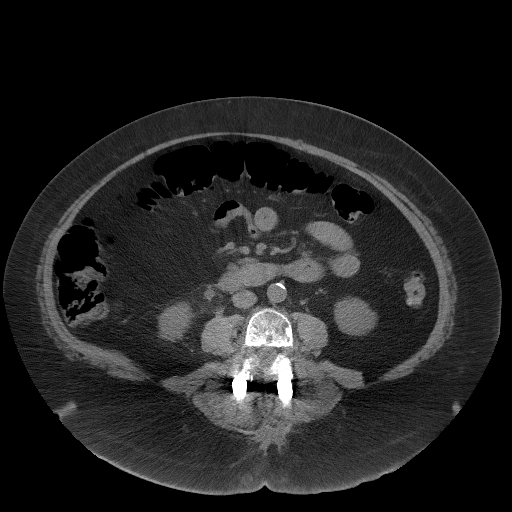
[im 52/92  soft-tissue]
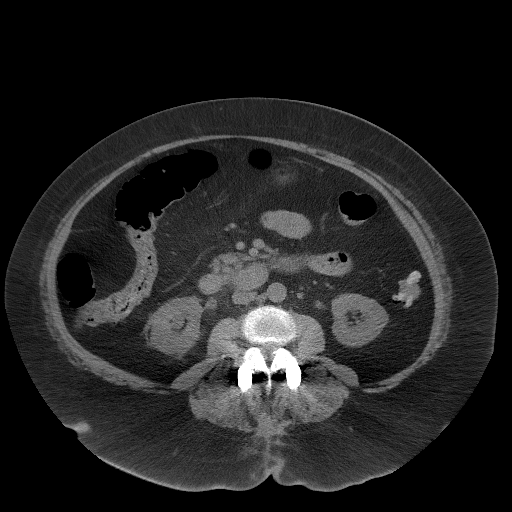
[im 60/92  soft-tissue]
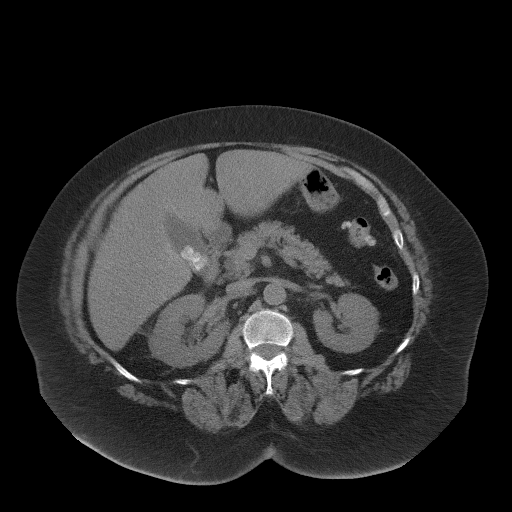
[im 60/92  bone]
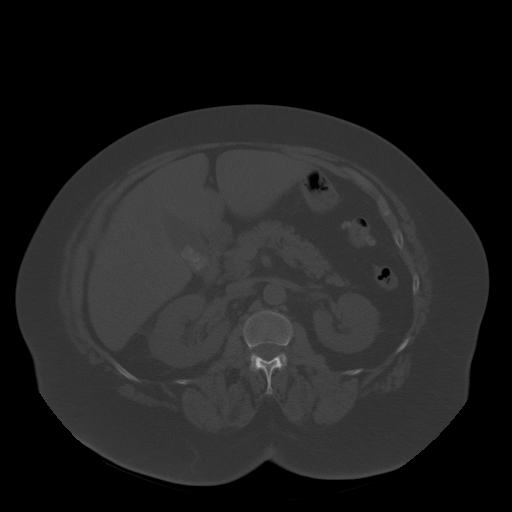
[im 68/92  soft-tissue]
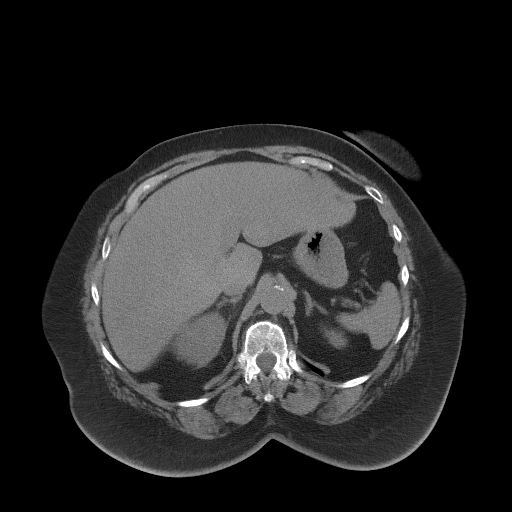
[im 72/92  soft-tissue]
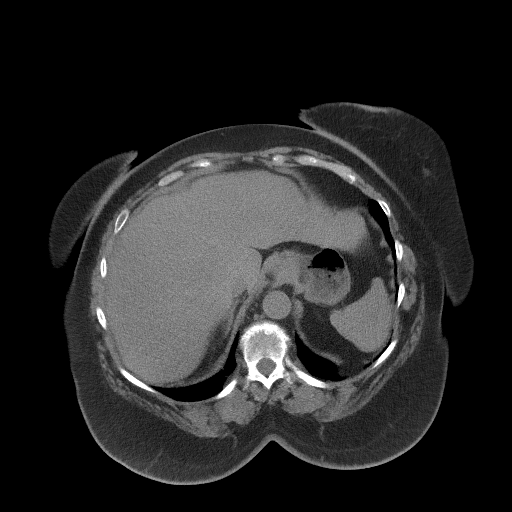
[im 80/92  soft-tissue]
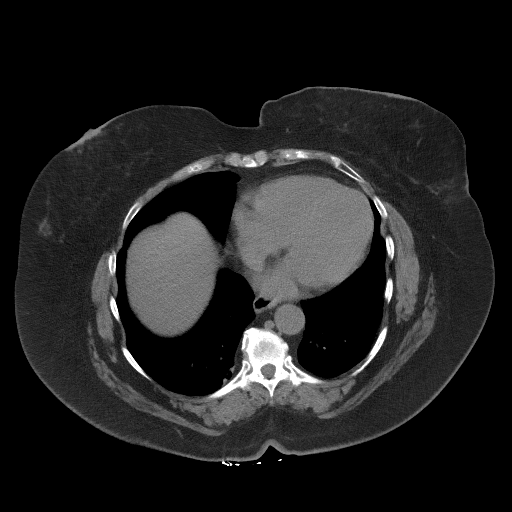
[im 88/92  soft-tissue]
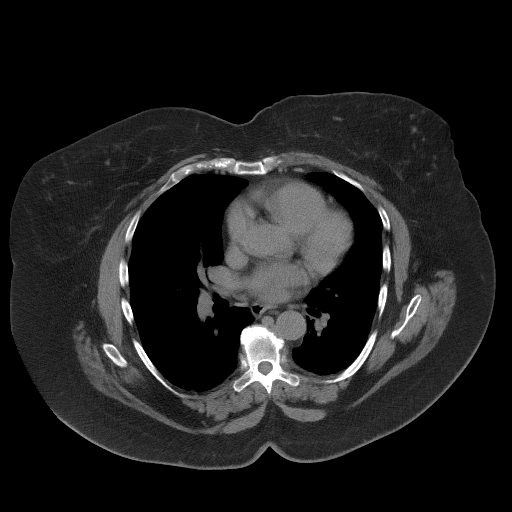

[Series 4: coronal st · coronal · 0.92mm/px · 3 of 117 slices shown]
[im 39/117  soft-tissue]
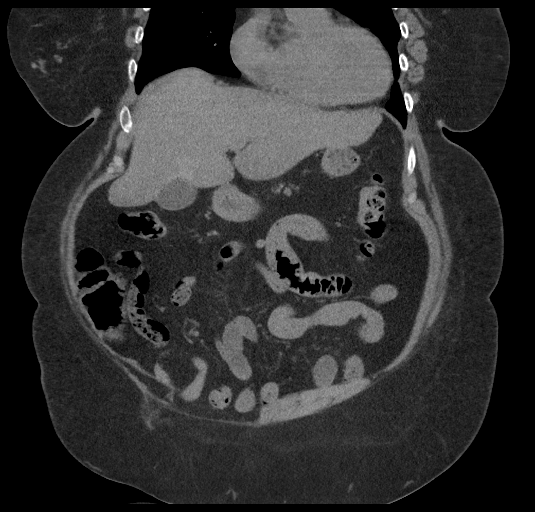
[im 52/117  soft-tissue]
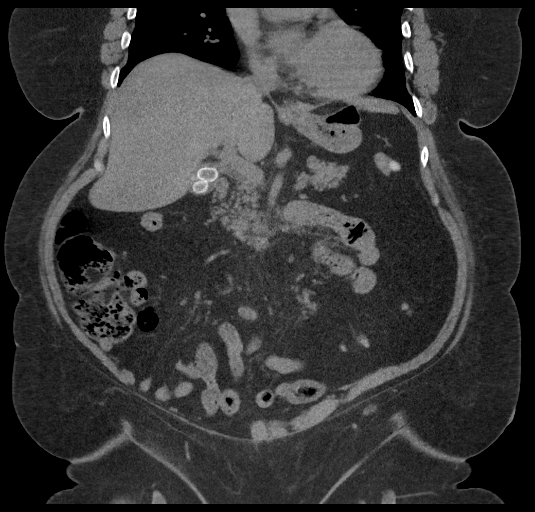
[im 65/117  soft-tissue]
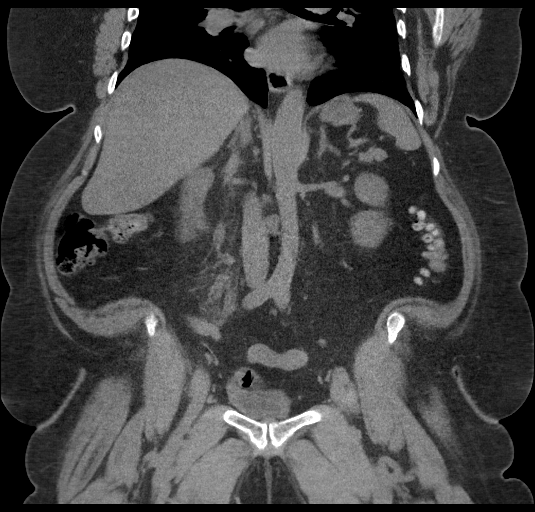

[16 of 46 positions shown; findings below may reference images not displayed]

FINDINGS: Lower chest: The visualized lung bases are grossly clear. The
visualized portions of the mediastinum are unremarkable.

Hepatobiliary: Stones are seen within the gallbladder. The
gallbladder is otherwise unremarkable. The liver is unremarkable in
appearance. The common bile duct remains normal in caliber.

Pancreas: The pancreas is within normal limits.

Spleen: The spleen is unremarkable in appearance.

Adrenals/Urinary Tract: The adrenal glands are unremarkable in
appearance)

Mild-to-moderate right-sided hydronephrosis is seen, with
obstructing 4 mm stone noted at the distal right ureter, 5 cm above
the right vesicoureteral junction. Right-sided perinephric stranding
and fluid are seen. A nonobstructing 3 mm stone is noted at the
lower pole of the right kidney. The left kidney is grossly
unremarkable in appearance.

Stomach/Bowel: The stomach is unremarkable in appearance. The small
bowel is within normal limits. There is mild extension of a small
bowel loop into an anterior abdominal wall bulge at the umbilicus,
without significant herniation. The patient is status post
appendectomy.

Scattered diverticulosis is noted along the distal transverse,
descending and sigmoid colon, without evidence of diverticulitis.
The colon is otherwise unremarkable in appearance.

Vascular/Lymphatic: Scattered calcification is seen along the
abdominal aorta and its branches. The abdominal aorta is otherwise
grossly unremarkable. The inferior vena cava is grossly
unremarkable. No retroperitoneal lymphadenopathy is seen. No pelvic
sidewall lymphadenopathy is identified.

Reproductive: The bladder is mildly distended and grossly
unremarkable. The uterus is unremarkable in appearance. The ovaries
are relatively symmetric. No suspicious adnexal masses are seen.

Other: No additional soft tissue abnormalities are seen.

Musculoskeletal: No acute osseous abnormalities are identified. The
patient is status post lumbar spinal fusion and decompression at
L4-S1, with underlying mild chronic degenerative change. The
visualized musculature is unremarkable in appearance.
IMPRESSION: 1. Mild-to-moderate right-sided hydronephrosis, with an obstructing
4 mm stone in the distal right ureter, 5 cm above the right
vesicoureteral junction.
2. Nonobstructing 3 mm stone at the lower pole of the right kidney.
3. Scattered diverticulosis along the distal transverse, descending
and sigmoid colon, without evidence of diverticulitis.
4. Scattered aortic atherosclerosis.
5. Cholelithiasis. Gallbladder otherwise unremarkable in appearance.

## 2018-03-25 DIAGNOSIS — G4733 Obstructive sleep apnea (adult) (pediatric): Secondary | ICD-10-CM | POA: Diagnosis not present

## 2018-03-28 ENCOUNTER — Ambulatory Visit (INDEPENDENT_AMBULATORY_CARE_PROVIDER_SITE_OTHER): Payer: Medicare Other | Admitting: Physician Assistant

## 2018-03-28 VITALS — BP 144/78 | HR 79 | Temp 98.2°F | Ht 66.0 in | Wt 261.0 lb

## 2018-03-28 DIAGNOSIS — E7849 Other hyperlipidemia: Secondary | ICD-10-CM | POA: Diagnosis not present

## 2018-03-28 DIAGNOSIS — Z6841 Body Mass Index (BMI) 40.0 and over, adult: Secondary | ICD-10-CM | POA: Diagnosis not present

## 2018-03-28 DIAGNOSIS — R7303 Prediabetes: Secondary | ICD-10-CM | POA: Diagnosis not present

## 2018-03-28 DIAGNOSIS — E559 Vitamin D deficiency, unspecified: Secondary | ICD-10-CM | POA: Diagnosis not present

## 2018-03-28 MED ORDER — VITAMIN D (ERGOCALCIFEROL) 1.25 MG (50000 UNIT) PO CAPS: 50000.0000 [IU] | ORAL_CAPSULE | ORAL | 0 refills | Status: DC

## 2018-03-28 NOTE — Progress Notes (Signed)
Office: [phone]  /  Fax: [phone]   HPI:   Chief Complaint: OBESITY [name] is here to discuss her progress with her obesity treatment plan. She is on the portion control better and make smarter food choices, such as increase vegetables and decrease simple carbohydrates and is following her eating plan approximately 50 % [name] the time. She states she is walking for 20 minutes 7 times per week. [name] did well with weight [name]. She is under a lot [name] stress with taking care [name] her husband, who is having back pain and is diabetic.  Her weight is 261 lb (118.4 kg) today and has had a weight [name] [name] 2 pounds over a period [name] 3 weeks since her last visit. She has lost 19 lbs since starting treatment with [name].  Vitamin D Deficiency [name] has a diagnosis [name] vitamin D deficiency. She is on prescription Vit D and denies nausea, vomiting or muscle weakness.  Hyperlipidemia [name] has hyperlipidemia and has been trying to improve her cholesterol levels with intensive lifestyle modification including a low saturated fat diet, exercise and weight [name]. She is on atorvastatin and she denies any chest pain, claudication or myalgias.  Pre-Diabetes [name] has a diagnosis [name] pre-diabetes based on her elevated Hgb A1c and was informed this puts her at greater risk [name] developing diabetes. She is on [name] and she denies nausea, vomiting, diarrhea, polyphagia, or hypoglycemia. She continues to work on diet and exercise to decrease risk [name] diabetes.   ALLERGIES: Allergies  Allergen Reactions  . Cymbalta [Duloxetine Hcl] Other (See Comments)    Stomach pain  . Gabapentin Itching  . Lipitor [Atorvastatin] Other (See Comments)    Cramps  . Lisinopril Swelling  . Maxzide [Hydrochlorothiazide W-Triamterene] Other (See Comments)    Cramping   . Pravastatin Sodium Other (See Comments)    Headache   . Simvastatin Other (See Comments)    Memory [name]  . Lodine [Etodolac] Itching     MEDICATIONS: Current Outpatient Medications on File Prior to Visit  Medication Sig Dispense Refill  . amitriptyline (ELAVIL) 25 MG tablet Take 25 mg by mouth every morning.     [name] aspirin 81 MG tablet Take 81 mg by mouth daily.    [name] atorvastatin (LIPITOR) 20 MG tablet Take 20 mg by mouth once a week.    . blood glucose meter kit and supplies KIT Dispense based on patient and insurance preference. Use up to four times daily as directed. (FOR ICD-9 250.00, 250.01). 1 each 0  . DUREZOL 0.05 % EMUL Place 1 drop into the left eye daily.  0  . escitalopram (LEXAPRO) 10 MG tablet Take 10 mg by mouth daily.    . ferrous sulfate 325 (65 FE) MG tablet Take 1 tablet (325 mg total) by mouth daily with breakfast. 30 tablet 0  . fluticasone (CUTIVATE) 0.05 % cream Apply 1 application topically 2 (two) times daily.  0  . hydrochlorothiazide (HYDRODIURIL) 25 MG tablet Take 1 tablet (25 mg total) by mouth daily. 30 tablet 0  . hydrOXYzine (ATARAX/VISTARIL) 10 MG tablet Take 10 mg by mouth 3 (three) times daily as needed.    . irbesartan (AVAPRO) 300 MG tablet Take 1 tablet (300 mg total) by mouth at bedtime. 30 tablet 0  . ketoconazole (NIZORAL) 2 % cream Apply 1 application topically daily. 15 g 0  . [name] (GLUCOPHAGE) 500 MG tablet take 1 tablet by mouth once daily with BREAKFAST 30 tablet 0  . niacin 500  MG CR capsule Take 500 mg by mouth 2 (two) times daily with a meal.    . Omega-3 Fatty Acids (FISH OIL) 1200 MG CAPS Take 500 mg by mouth daily.     [name] tiZANidine (ZANAFLEX) 2 MG tablet Take 2 mg by mouth at bedtime.    . verapamil (VERELAN PM) 240 MG 24 hr capsule Take 240 mg by mouth daily.     . Vitamin D, Cholecalciferol, 400 UNITS TABS Take 400 Units by mouth at bedtime.     No current facility-administered medications on file prior to visit.     PAST MEDICAL HISTORY: Past Medical History:  Diagnosis Date  . Anemia   . Anxiety    takes Xanax daily  . Arthritis   . Back pain   . Blood  transfusion   . Breast cancer ([place]) 2013   left breast  . Bronchitis    hx [name]  . Chronic fatigue syndrome   . Depression    takes Lexapro daily  . Diabetes mellitus without complication ([place])    boderline  . Diverticulosis   . Edema    feet and legs  . Headache(784.0)   . History [name] colon polyps 1998   adenomatous  . Hyperlipidemia    takes Niacin daily  . Hypertension    takes Verapamil and Avapro daily  . Joint pain   . OSA (obstructive sleep apnea)   . Personal history [name] radiation therapy   . Pneumonia 4/12   Albuterol daily as needed  . Radiation 10/29/11-11/24/11   left breast 6100 cGy  . Shortness [name] breath    with exertion  . Spinal stenosis   . Tinnitus     PAST SURGICAL HISTORY: Past Surgical History:  Procedure Laterality Date  . APPENDECTOMY  1978  . BACK SURGERY    . BELPHAROPTOSIS REPAIR    . BREAST EXCISIONAL BIOPSY Right 02/06/2013  . BREAST LUMPECTOMY  08/2011   left  . BREAST LUMPECTOMY WITH NEEDLE LOCALIZATION Right 02/06/2013   Procedure: RIGHT BREAST LUMPECTOMY WITH NEEDLE LOCALIZATION;  Surgeon: Harl Bowie, MD;  Location: [facility];  Service: General;  Laterality: Right;  . COLONOSCOPY W/ POLYPECTOMY    . COLONOSCOPY WITH PROPOFOL N/A 10/27/2015   Procedure: COLONOSCOPY WITH PROPOFOL;  Surgeon: Mauri Pole, MD;  Location: [facility];  Service: Endoscopy;  Laterality: N/A;  . HARDWARE REMOVAL Left 04/20/2015   Procedure: HARDWARE REMOVAL LEFT LUMBAR FIVE SCREW;  Surgeon: Karie Chimera, MD;  Location: [facility];  Service: Neurosurgery;  Laterality: Left;  . JOINT REPLACEMENT Bilateral    bilateral knee  . POLYPECTOMY  1990's  . THYROID CYST EXCISION  1994  . TOTAL KNEE ARTHROPLASTY  2006   bilateral  . TRANSPHENOIDAL / TRANSNASAL HYPOPHYSECTOMY / RESECTION PITUITARY TUMOR  6/11    SOCIAL HISTORY: Social History   Tobacco Use  . Smoking status: Never Smoker  . Smokeless tobacco: Never Used  Substance Use Topics  . Alcohol use: No   . Drug use: No    FAMILY HISTORY: Family History  Problem Relation Age [name] Onset  . Heart disease Father   . Clotting disorder Father   . Stroke Father   . Hypertension Father   . Heart Problems Father   . Stroke Mother   . Diabetes Mother   . Hypertension Mother   . Hyperlipidemia Mother   . Obesity Mother   . Cancer Sister 32       Breast Cancer  . Breast  cancer Sister 55  . Colon cancer Neg Hx   . Neuropathy Neg Hx     ROS: Review [name] Systems  Constitutional: Positive for weight [name].  Cardiovascular: Negative for chest pain and claudication.  Gastrointestinal: Negative for diarrhea, nausea and vomiting.  Musculoskeletal: Negative for myalgias.       Negative muscle weakness  Endo/Heme/Allergies:       Negative polyphagia Negative hypoglycemia    PHYSICAL EXAM: Blood pressure (!) 144/78, pulse 79, temperature 98.2 F (36.8 C), temperature source Oral, height _0  (1.676 m), weight 261 lb (118.4 kg), SpO2 99 %. Body mass index is 42.13 kg/m. Physical Exam  Constitutional: [name], place, and time. She appears well-developed and well-nourished.  Cardiovascular: Normal rate.  Pulmonary/Chest: Effort normal.  Musculoskeletal: Normal range [name] motion.  Neurological: [name], place, and time.  Skin: Skin is warm and dry.  Psychiatric: She has a normal mood and affect. Her behavior is normal.  Vitals reviewed.   RECENT LABS AND TESTS: BMET    Component Value Date/Time   NA 140 08/30/2017 0736   NA 143 05/10/2017 1006   NA 140 06/01/2016 1143   K 3.7 08/30/2017 0736   K 4.0 06/01/2016 1143   CL 106 08/30/2017 0736   CL 105 12/09/2012 1423   CO2 24 08/30/2017 0736   CO2 26 06/01/2016 1143   GLUCOSE 126 08/30/2017 0736   GLUCOSE 121 06/01/2016 1143   GLUCOSE 177 (H) 12/09/2012 1423   BUN 20 08/30/2017 0736   BUN 22 05/10/2017 1006   BUN 12.5 06/01/2016 1143   CREATININE 1.17 (H) 08/30/2017 0736   CREATININE 1.1  06/01/2016 1143   CALCIUM 9.9 08/30/2017 0736   CALCIUM 10.1 06/01/2016 1143   GFRNONAA 45 (L) 08/30/2017 0736   GFRAA 52 (L) 08/30/2017 0736   Lab Results  Component Value Date   HGBA1C 6.8 (H) 05/10/2017   HGBA1C 6.2 (H) 12/14/2016   HGBA1C 6.3 (H) 08/31/2016   HGBA1C (H) 11/11/2010    6.7 (NOTE)                                                                       According to the ADA Clinical Practice Recommendations for 2011, when HbA1c is used as a screening test:   >=6.5%   Diagnostic [name] Diabetes Mellitus           (if abnormal result  is confirmed)  5.7-6.4%   Increased risk [name] developing Diabetes Mellitus  References:Diagnosis and Classification [name] Diabetes Mellitus,Diabetes NTZG,0174,94(WHQPR 1):S62-S69 and Standards [name] Medical Care in         Diabetes - 2011,Diabetes Care,2011,34  (Suppl 1):S11-S61.   Lab Results  Component Value Date   INSULIN 10.5 05/10/2017   INSULIN 20.9 12/14/2016   INSULIN 23.5 08/31/2016   CBC    Component Value Date/Time   WBC 7.1 08/30/2017 0736   WBC 14.5 (H) 08/21/2016 2027   RBC 4.05 08/30/2017 0736   HGB 10.7 (L) 08/30/2017 0736   HGB 11.0 (L) 05/10/2017 1006   HGB 11.1 (L) 06/01/2016 1143   HCT 32.7 (L) 08/30/2017 0736   HCT 34.4 05/10/2017 1006   HCT 34.9 06/01/2016 1143   PLT 243  08/30/2017 0736   PLT 248 06/01/2016 1143   MCV 80.7 08/30/2017 0736   MCV 80 05/10/2017 1006   MCV 81.2 06/01/2016 1143   MCH 26.3 08/30/2017 0736   MCHC 32.6 08/30/2017 0736   RDW 15.8 (H) 08/30/2017 0736   RDW 16.5 (H) 05/10/2017 1006   RDW 15.8 (H) 06/01/2016 1143   LYMPHSABS 1.8 08/30/2017 0736   LYMPHSABS 1.3 05/10/2017 1006   LYMPHSABS 1.8 06/01/2016 1143   MONOABS 0.5 08/30/2017 0736   MONOABS 0.6 06/01/2016 1143   EOSABS 0.1 08/30/2017 0736   EOSABS 0.1 05/10/2017 1006   BASOSABS 0.0 08/30/2017 0736   BASOSABS 0.0 05/10/2017 1006   BASOSABS 0.0 06/01/2016 1143   Iron/TIBC/Ferritin/ %Sat    Component Value Date/Time   IRON 55  12/14/2016 1146   TIBC 329 12/14/2016 1146   FERRITIN 479 (H) 12/14/2016 1146   FERRITIN 362 (H) 04/15/2015 1058   IRONPCTSAT 17 12/14/2016 1146   Lipid Panel     Component Value Date/Time   CHOL 212 (H) 05/10/2017 1006   TRIG 116 05/10/2017 1006   HDL 44 05/10/2017 1006   CHOLHDL 6.4 11/11/2010 0555   VLDL 17 11/11/2010 0555   LDLCALC 145 (H) 05/10/2017 1006   Hepatic Function Panel     Component Value Date/Time   PROT 7.0 08/30/2017 0736   PROT 6.9 05/10/2017 1006   PROT 7.6 06/01/2016 1143   ALBUMIN 3.3 (L) 08/30/2017 0736   ALBUMIN 3.9 05/10/2017 1006   ALBUMIN 3.2 (L) 06/01/2016 1143   AST 10 08/30/2017 0736   AST 11 06/01/2016 1143   ALT 9 08/30/2017 0736   ALT 12 06/01/2016 1143   ALKPHOS 52 08/30/2017 0736   ALKPHOS 69 06/01/2016 1143   BILITOT 0.4 08/30/2017 0736   BILITOT 0.28 06/01/2016 1143   Results for VENESSA, WICKHAM (MRN [id number]) as [name] 03/28/2018 14:53  Ref. Range 05/10/2017 10:06  Vitamin D, 25-Hydroxy Latest Ref Range: 30.0 - 100.0 ng/mL 39.3    ASSESSMENT AND PLAN: Vitamin D deficiency - Plan: VITAMIN D 25 Hydroxy (Vit-D Deficiency, Fractures), Vitamin D, Ergocalciferol, (DRISDOL) 50000 units CAPS capsule  Other hyperlipidemia - Plan: Lipid Panel With LDL/HDL Ratio  Prediabetes - Plan: Comprehensive metabolic panel, Hemoglobin A1c, Insulin, random  Class 3 severe obesity with serious comorbidity and body mass index (BMI) [name] 40.0 to 44.9 in adult, unspecified obesity type (HCC)  PLAN:  Vitamin D Deficiency [name] was informed that low vitamin D levels contributes to fatigue and are associated with obesity, breast, and colon cancer. [name] agrees to continue taking prescription Vit D _0 ,000 IU every week #4 and we will refill for 1 month. She will follow up for routine testing [name] vitamin D, at least 2-3 times per year. She was informed [name] the risk [name] over-replacement [name] vitamin D and agrees to not increase her dose unless she discusses this with  [name] first. We will check labs and [name] agrees to follow up with our clinic in 2 to 3 weeks.  Hyperlipidemia [name] was informed [name] the American Heart Association Guidelines emphasizing intensive lifestyle modifications as the first line treatment for hyperlipidemia. We discussed many lifestyle modifications today in depth, and [name] will continue to work on decreasing saturated fats such as fatty red meat, butter and many fried foods. [name] agrees to continue her medications, and she will also increase vegetables and lean protein in her diet, and continue to work on diet, exercise, and weight [name] efforts. We will check labs and [name] agrees  to follow up with our clinic in 2 to 3 weeks.  Pre-Diabetes [name], diet, exercise, and decreasing simple carbohydrates in her diet to help decrease the risk [name] diabetes. We dicussed [name] including benefits and risks. She was informed that eating too many simple carbohydrates or too many calories at one sitting increases the likelihood [name] GI side effects. [name] agrees to continue taking [name], we will check labs and she agrees to follow up with our clinic in 2 to 3 weeks as directed to monitor her progress.  Obesity [name]. As such, her goal is to continue with weight [name] efforts She has agreed to portion control better and make smarter food choices, such as increase vegetables and decrease simple carbohydrates  [name] 150 minutes [name] combined cardio and strengthening exercise per week for weight [name] and overall health benefits. We discussed the following Behavioral Modification Strategies today: increasing lean protein intake, increasing vegetables and work on meal planning and easy cooking plans   [name] has agreed to follow up with our clinic in 2 to 3 weeks. She was informed [name] the importance [name] frequent follow up visits to maximize  her success with intensive lifestyle modifications for her multiple health conditions.   OBESITY BEHAVIORAL INTERVENTION VISIT  Today's visit was # 34.  Starting weight: 280 lbs Starting date: 08/31/16 Today's weight : 261 lbs Today's date: 03/28/2018 Total lbs lost to date: 27    ASK: We discussed the diagnosis [name] obesity with Luna Kitchens today and Gesenia agreed to give [name] permission to discuss obesity behavioral modification therapy today.  ASSESS: Janiah has the diagnosis [name] obesity and her BMI today is 42.15 Chrisandra is in the action stage [name] [name]   ADVISE: Lacresia was educated on the multiple health risks [name] obesity as well as the benefit [name] weight [name] to improve her health. She was advised [name] the need for long term treatment and the importance [name] lifestyle modifications.  AGREE: Multiple dietary modification options and treatment options were discussed and  Nannette agreed to the above obesity treatment plan.  Wilhemena Durie, am acting as transcriptionist for [name], PA-C I, [name], PA-C have reviewed above note and agree with its content

## 2018-03-29 LAB — COMPREHENSIVE METABOLIC PANEL
A/G RATIO: 1.3 (ref 1.2–2.2)
ALT: 14 IU/L (ref 0–32)
AST: 12 IU/L (ref 0–40)
Albumin: 4 g/dL (ref 3.5–4.8)
Alkaline Phosphatase: 71 IU/L (ref 39–117)
BILIRUBIN TOTAL: 0.4 mg/dL (ref 0.0–1.2)
BUN/Creatinine Ratio: 16 (ref 12–28)
BUN: 19 mg/dL (ref 8–27)
CHLORIDE: 104 mmol/L (ref 96–106)
CO2: 24 mmol/L (ref 20–29)
Calcium: 10.2 mg/dL (ref 8.7–10.3)
Creatinine, Ser: 1.19 mg/dL — ABNORMAL HIGH (ref 0.57–1.00)
GFR calc Af Amer: 52 mL/min/{1.73_m2} — ABNORMAL LOW (ref >59)
GFR calc non Af Amer: 45 mL/min/{1.73_m2} — ABNORMAL LOW (ref >59)
Globulin, Total: 3.1 g/dL (ref 1.5–4.5)
Glucose: 97 mg/dL (ref 65–99)
POTASSIUM: 4.4 mmol/L (ref 3.5–5.2)
SODIUM: 144 mmol/L (ref 134–144)
TOTAL PROTEIN: 7.1 g/dL (ref 6.0–8.5)

## 2018-03-29 LAB — HEMOGLOBIN A1C
Est. average glucose Bld gHb Est-mCnc: 128 mg/dL
Hgb A1c MFr Bld: 6.1 % — ABNORMAL HIGH (ref 4.8–5.6)

## 2018-03-29 LAB — LIPID PANEL WITH LDL/HDL RATIO
CHOLESTEROL TOTAL: 230 mg/dL — AB (ref 100–199)
HDL: 41 mg/dL (ref >39)
LDL Calculated: 164 mg/dL — ABNORMAL HIGH (ref 0–99)
LDl/HDL Ratio: 4 ratio — ABNORMAL HIGH (ref 0.0–3.2)
TRIGLYCERIDES: 125 mg/dL (ref 0–149)
VLDL Cholesterol Cal: 25 mg/dL (ref 5–40)

## 2018-03-29 LAB — INSULIN, RANDOM: INSULIN: 20.7 u[IU]/mL (ref 2.6–24.9)

## 2018-03-29 LAB — VITAMIN D 25 HYDROXY (VIT D DEFICIENCY, FRACTURES): Vit D, 25-Hydroxy: 41.9 ng/mL (ref 30.0–100.0)

## 2018-04-18 ENCOUNTER — Ambulatory Visit (INDEPENDENT_AMBULATORY_CARE_PROVIDER_SITE_OTHER): Payer: Medicare Other | Admitting: Physician Assistant

## 2018-04-18 VITALS — BP 113/73 | HR 76 | Temp 98.0°F | Ht 66.0 in | Wt 260.0 lb

## 2018-04-18 DIAGNOSIS — Z6841 Body Mass Index (BMI) 40.0 and over, adult: Secondary | ICD-10-CM | POA: Diagnosis not present

## 2018-04-18 DIAGNOSIS — E559 Vitamin D deficiency, unspecified: Secondary | ICD-10-CM | POA: Diagnosis not present

## 2018-04-18 DIAGNOSIS — E119 Type 2 diabetes mellitus without complications: Secondary | ICD-10-CM | POA: Diagnosis not present

## 2018-04-18 MED ORDER — METFORMIN HCL 500 MG PO TABS: ORAL_TABLET | ORAL | 0 refills | Status: DC

## 2018-04-18 MED ORDER — VITAMIN D (ERGOCALCIFEROL) 1.25 MG (50000 UNIT) PO CAPS: 50000.0000 [IU] | ORAL_CAPSULE | ORAL | 0 refills | Status: DC

## 2018-04-22 NOTE — Progress Notes (Signed)
Office: [phone]  /  Fax: [phone]   HPI:   Chief Complaint: OBESITY [name] is here to discuss her progress with her obesity treatment plan. She is on the portion control better and make smarter food choices, such as increase vegetables and decrease simple carbohydrates and is following her eating plan approximately 80 % of the time. She states she is walking up and down steps for 30 minutes 7 times per week. [name] did well with weight loss. She reports that she has been having cravings for cake, however she has not been eating them. She is making smart choices throughout the day.  Her weight is 260 lb (117.9 kg) today and has had a weight loss of 1 pound over a period of 3 weeks since her last visit. She has lost 20 lbs since starting treatment with [name].  Diabetes II [name] has a diagnosis of diabetes type II. [name] states that her blood sugars have been 123 after meals. She is on metformin and denies any polyphagia or hypoglycemic episodes. Last A1c was 6.1. She has been working on intensive lifestyle modifications including diet, exercise, and weight loss to help control her blood glucose levels.  Vitamin D deficiency [name] has a diagnosis of vitamin D deficiency. She is currently taking vit D and denies nausea, vomiting or muscle weakness.  ALLERGIES: Allergies  Allergen Reactions  . Cymbalta [Duloxetine Hcl] Other (See Comments)    Stomach pain  . Gabapentin Itching  . Lipitor [Atorvastatin] Other (See Comments)    Cramps  . Lisinopril Swelling  . Maxzide [Hydrochlorothiazide W-Triamterene] Other (See Comments)    Cramping   . Pravastatin Sodium Other (See Comments)    Headache   . Simvastatin Other (See Comments)    Memory loss  . Lodine [Etodolac] Itching    MEDICATIONS: Current Outpatient Medications on File Prior to Visit  Medication Sig Dispense Refill  . amitriptyline (ELAVIL) 25 MG tablet Take 25 mg by mouth every morning.     [name] aspirin 81 MG tablet Take  81 mg by mouth daily.    [name] atorvastatin (LIPITOR) 20 MG tablet Take 20 mg by mouth once a week.    . blood glucose meter kit and supplies KIT Dispense based on patient and insurance preference. Use up to four times daily as directed. (FOR ICD-9 250.00, 250.01). 1 each 0  . DUREZOL 0.05 % EMUL Place 1 drop into the left eye daily.  0  . escitalopram (LEXAPRO) 10 MG tablet Take 10 mg by mouth daily.    . ferrous sulfate 325 (65 FE) MG tablet Take 1 tablet (325 mg total) by mouth daily with breakfast. 30 tablet 0  . fluticasone (CUTIVATE) 0.05 % cream Apply 1 application topically 2 (two) times daily.  0  . hydrochlorothiazide (HYDRODIURIL) 25 MG tablet Take 1 tablet (25 mg total) by mouth daily. 30 tablet 0  . hydrOXYzine (ATARAX/VISTARIL) 10 MG tablet Take 10 mg by mouth 3 (three) times daily as needed.    . irbesartan (AVAPRO) 300 MG tablet Take 1 tablet (300 mg total) by mouth at bedtime. 30 tablet 0  . ketoconazole (NIZORAL) 2 % cream Apply 1 application topically daily. 15 g 0  . niacin 500 MG CR capsule Take 500 mg by mouth 2 (two) times daily with a meal.    . Omega-3 Fatty Acids (FISH OIL) 1200 MG CAPS Take 500 mg by mouth daily.     [name] tiZANidine (ZANAFLEX) 2 MG tablet Take 2 mg by  mouth at bedtime.    . verapamil (VERELAN PM) 240 MG 24 hr capsule Take 240 mg by mouth daily.     . Vitamin D, Cholecalciferol, 400 UNITS TABS Take 400 Units by mouth at bedtime.     No current facility-administered medications on file prior to visit.     PAST MEDICAL HISTORY: Past Medical History:  Diagnosis Date  . Anemia   . Anxiety    takes Xanax daily  . Arthritis   . Back pain   . Blood transfusion   . Breast cancer ([place]) 2013   left breast  . Bronchitis    hx of  . Chronic fatigue syndrome   . Depression    takes Lexapro daily  . Diabetes mellitus without complication ([place])    boderline  . Diverticulosis   . Edema    feet and legs  . Headache(784.0)   . History of colon polyps 1998     adenomatous  . Hyperlipidemia    takes Niacin daily  . Hypertension    takes Verapamil and Avapro daily  . Joint pain   . OSA (obstructive sleep apnea)   . Personal history of radiation therapy   . Pneumonia 4/12   Albuterol daily as needed  . Radiation 10/29/11-11/24/11   left breast 6100 cGy  . Shortness of breath    with exertion  . Spinal stenosis   . Tinnitus     PAST SURGICAL HISTORY: Past Surgical History:  Procedure Laterality Date  . APPENDECTOMY  1978  . BACK SURGERY    . BELPHAROPTOSIS REPAIR    . BREAST EXCISIONAL BIOPSY Right 02/06/2013  . BREAST LUMPECTOMY  08/2011   left  . BREAST LUMPECTOMY WITH NEEDLE LOCALIZATION Right 02/06/2013   Procedure: RIGHT BREAST LUMPECTOMY WITH NEEDLE LOCALIZATION;  Surgeon: Harl Bowie, MD;  Location: [facility];  Service: General;  Laterality: Right;  . COLONOSCOPY W/ POLYPECTOMY    . COLONOSCOPY WITH PROPOFOL N/A 10/27/2015   Procedure: COLONOSCOPY WITH PROPOFOL;  Surgeon: Mauri Pole, MD;  Location: [facility];  Service: Endoscopy;  Laterality: N/A;  . HARDWARE REMOVAL Left 04/20/2015   Procedure: HARDWARE REMOVAL LEFT LUMBAR FIVE SCREW;  Surgeon: Karie Chimera, MD;  Location: [facility];  Service: Neurosurgery;  Laterality: Left;  . JOINT REPLACEMENT Bilateral    bilateral knee  . POLYPECTOMY  1990's  . THYROID CYST EXCISION  1994  . TOTAL KNEE ARTHROPLASTY  2006   bilateral  . TRANSPHENOIDAL / TRANSNASAL HYPOPHYSECTOMY / RESECTION PITUITARY TUMOR  6/11    SOCIAL HISTORY: Social History   Tobacco Use  . Smoking status: Never Smoker  . Smokeless tobacco: Never Used  Substance Use Topics  . Alcohol use: No  . Drug use: No    FAMILY HISTORY: Family History  Problem Relation Age of Onset  . Heart disease Father   . Clotting disorder Father   . Stroke Father   . Hypertension Father   . Heart Problems Father   . Stroke Mother   . Diabetes Mother   . Hypertension Mother   . Hyperlipidemia Mother   .  Obesity Mother   . Cancer Sister 48       Breast Cancer  . Breast cancer Sister 52  . Colon cancer Neg Hx   . Neuropathy Neg Hx     ROS: Review of Systems  Constitutional: Positive for weight loss.  Gastrointestinal: Negative for nausea and vomiting.  Musculoskeletal:       Negative for  muscle weakness.  Endo/Heme/Allergies:       Negative for hypoglycemia. Negative for polyphagia. Positive for cravings.    PHYSICAL EXAM: Blood pressure 113/73, pulse 76, temperature 98 F (36.7 C), temperature source Oral, height _0  (1.676 m), weight 260 lb (117.9 kg), SpO2 99 %. Body mass index is 41.97 kg/m. Physical Exam  Constitutional: [name], place, and time. She appears well-developed and well-nourished.  Cardiovascular: Normal rate.  Pulmonary/Chest: Effort normal.  Musculoskeletal: Normal range of motion.  Neurological: [name], place, and time.  Skin: Skin is warm and dry.  Psychiatric: She has a normal mood and affect. Her behavior is normal.  Vitals reviewed.   RECENT LABS AND TESTS: BMET    Component Value Date/Time   NA 144 03/28/2018 0941   NA 140 06/01/2016 1143   K 4.4 03/28/2018 0941   K 4.0 06/01/2016 1143   CL 104 03/28/2018 0941   CL 105 12/09/2012 1423   CO2 24 03/28/2018 0941   CO2 26 06/01/2016 1143   GLUCOSE 97 03/28/2018 0941   GLUCOSE 126 08/30/2017 0736   GLUCOSE 121 06/01/2016 1143   GLUCOSE 177 (H) 12/09/2012 1423   BUN 19 03/28/2018 0941   BUN 12.5 06/01/2016 1143   CREATININE 1.19 (H) 03/28/2018 0941   CREATININE 1.17 (H) 08/30/2017 0736   CREATININE 1.1 06/01/2016 1143   CALCIUM 10.2 03/28/2018 0941   CALCIUM 10.1 06/01/2016 1143   GFRNONAA 45 (L) 03/28/2018 0941   GFRNONAA 45 (L) 08/30/2017 0736   GFRAA 52 (L) 03/28/2018 0941   GFRAA 52 (L) 08/30/2017 0736   Lab Results  Component Value Date   HGBA1C 6.1 (H) 03/28/2018   HGBA1C 6.8 (H) 05/10/2017   HGBA1C 6.2 (H) 12/14/2016   HGBA1C 6.3 (H)  08/31/2016   HGBA1C (H) 11/11/2010    6.7 (NOTE)                                                                       According to the ADA Clinical Practice Recommendations for 2011, when HbA1c is used as a screening test:   >=6.5%   Diagnostic of Diabetes Mellitus           (if abnormal result  is confirmed)  5.7-6.4%   Increased risk of developing Diabetes Mellitus  References:Diagnosis and Classification of Diabetes Mellitus,Diabetes XENM,0768,08(UPJSR 1):S62-S69 and Standards of Medical Care in         Diabetes - 2011,Diabetes Care,2011,34  (Suppl 1):S11-S61.   Lab Results  Component Value Date   INSULIN 20.7 03/28/2018   INSULIN 10.5 05/10/2017   INSULIN 20.9 12/14/2016   INSULIN 23.5 08/31/2016   CBC    Component Value Date/Time   WBC 7.1 08/30/2017 0736   WBC 14.5 (H) 08/21/2016 2027   RBC 4.05 08/30/2017 0736   HGB 10.7 (L) 08/30/2017 0736   HGB 11.0 (L) 05/10/2017 1006   HGB 11.1 (L) 06/01/2016 1143   HCT 32.7 (L) 08/30/2017 0736   HCT 34.4 05/10/2017 1006   HCT 34.9 06/01/2016 1143   PLT 243 08/30/2017 0736   PLT 248 06/01/2016 1143   MCV 80.7 08/30/2017 0736   MCV 80 05/10/2017 1006   MCV 81.2 06/01/2016 1143  MCH 26.3 08/30/2017 0736   MCHC 32.6 08/30/2017 0736   RDW 15.8 (H) 08/30/2017 0736   RDW 16.5 (H) 05/10/2017 1006   RDW 15.8 (H) 06/01/2016 1143   LYMPHSABS 1.8 08/30/2017 0736   LYMPHSABS 1.3 05/10/2017 1006   LYMPHSABS 1.8 06/01/2016 1143   MONOABS 0.5 08/30/2017 0736   MONOABS 0.6 06/01/2016 1143   EOSABS 0.1 08/30/2017 0736   EOSABS 0.1 05/10/2017 1006   BASOSABS 0.0 08/30/2017 0736   BASOSABS 0.0 05/10/2017 1006   BASOSABS 0.0 06/01/2016 1143   Iron/TIBC/Ferritin/ %Sat    Component Value Date/Time   IRON 55 12/14/2016 1146   TIBC 329 12/14/2016 1146   FERRITIN 479 (H) 12/14/2016 1146   FERRITIN 362 (H) 04/15/2015 1058   IRONPCTSAT 17 12/14/2016 1146   Lipid Panel     Component Value Date/Time   CHOL 230 (H) 03/28/2018 0941   TRIG  125 03/28/2018 0941   HDL 41 03/28/2018 0941   CHOLHDL 6.4 11/11/2010 0555   VLDL 17 11/11/2010 0555   LDLCALC 164 (H) 03/28/2018 0941   Hepatic Function Panel     Component Value Date/Time   PROT 7.1 03/28/2018 0941   PROT 7.6 06/01/2016 1143   ALBUMIN 4.0 03/28/2018 0941   ALBUMIN 3.2 (L) 06/01/2016 1143   AST 12 03/28/2018 0941   AST 10 08/30/2017 0736   AST 11 06/01/2016 1143   ALT 14 03/28/2018 0941   ALT 9 08/30/2017 0736   ALT 12 06/01/2016 1143   ALKPHOS 71 03/28/2018 0941   ALKPHOS 69 06/01/2016 1143   BILITOT 0.4 03/28/2018 0941   BILITOT 0.4 08/30/2017 0736   BILITOT 0.28 06/01/2016 1143    Results for JALEIYAH, ALAS (MRN [id number]) as of 04/22/2018 14:04  Ref. Range 03/28/2018 09:41  Vitamin D, 25-Hydroxy Latest Ref Range: 30.0 - 100.0 ng/mL 41.9    ASSESSMENT AND PLAN: Type 2 diabetes mellitus without complication, without long-term current use of insulin (HCC) - Plan: metFORMIN (GLUCOPHAGE) 500 MG tablet  Vitamin D deficiency - Plan: Vitamin D, Ergocalciferol, (DRISDOL) 50000 units CAPS capsule  Class 3 severe obesity with serious comorbidity and body mass index (BMI) of 40.0 to 44.9 in adult, unspecified obesity type ([place])  PLAN:  Diabetes II [name] has been given extensive diabetes education by myself today including ideal fasting and post-prandial blood glucose readings, individual ideal Hgb A1c goals and hypoglycemia prevention. We discussed the importance of good blood sugar control to decrease the likelihood of diabetic complications such as nephropathy, neuropathy, limb loss, blindness, coronary artery disease, and death. We discussed the importance of intensive lifestyle modification including diet, exercise and weight loss as the first line treatment for diabetes. Coline agrees to continue her metformin 567m # 30 with no refills and agrees to follow up at the agreed upon time in 3 weeks.  Vitamin D Deficiency BS[place]oyawas informed that low vitamin D  levels contributes to fatigue and are associated with obesity, breast, and colon cancer. She agrees to continue to take prescription Vit D _0 ,000 IU every week #4 with no refills and will follow up for routine testing of vitamin D, at least 2-3 times per year. She was informed of the risk of over-replacement of vitamin D and agrees to not increase her dose unless she discusses this with [place]. BMapleagrees to follow up in 3 weeks.  Obesity BNathalyis currently in the action stage of change. As such, her goal is to continue with weight loss efforts. She has agreed  to portion control better and make smarter food choices. [name] has been instructed to work up to a goal of 150 minutes of combined cardio and strengthening exercise per week for weight loss and overall health benefits. We discussed the following Behavioral Modification Strategies today: increasing lean protein intake and keeping healthy foods in the home.  [name] has agreed to follow up with our clinic in 3 weeks. She was informed of the importance of frequent follow up visits to maximize her success with intensive lifestyle modifications for her multiple health conditions.   OBESITY BEHAVIORAL INTERVENTION VISIT  Today's visit was # 35  Starting weight: 280 lbs Starting date: 08/31/16 Today's weight : Weight: 260 lb (117.9 kg)  Today's date: 04/18/2018 Total lbs lost to date: 20 At least 15 minutes were spent on discussing the following behavioral intervention visit.   ASK: We discussed the diagnosis of obesity with [name] today and [name] agreed to give [name] permission to discuss obesity behavioral modification therapy today.  ASSESS: [name] has the diagnosis of obesity and her BMI today is 41.99. [name] is in the action stage of change.   ADVISE: [name] was educated on the multiple health risks of obesity as well as the benefit of weight loss to improve her health. She was advised of the need for long term treatment and  the importance of lifestyle modifications to improve her current health and to decrease her risk of future health problems.  AGREE: Multiple dietary modification options and treatment options were discussed and [name] agreed to follow the recommendations documented in the above note.  ARRANGE: Tamirra was educated on the importance of frequent visits to treat obesity as outlined per CMS and USPSTF guidelines and agreed to schedule her next follow up appointment today.  Lenward Chancellor, am acting as transcriptionist for [name], PA-C I, [name], PA-C have reviewed above note and agree with its content

## 2018-05-02 DIAGNOSIS — M542 Cervicalgia: Secondary | ICD-10-CM | POA: Diagnosis not present

## 2018-05-02 DIAGNOSIS — M25511 Pain in right shoulder: Secondary | ICD-10-CM | POA: Diagnosis not present

## 2018-05-09 ENCOUNTER — Encounter (INDEPENDENT_AMBULATORY_CARE_PROVIDER_SITE_OTHER): Payer: Self-pay | Admitting: Physician Assistant

## 2018-05-09 ENCOUNTER — Ambulatory Visit (INDEPENDENT_AMBULATORY_CARE_PROVIDER_SITE_OTHER): Payer: Medicare Other | Admitting: Physician Assistant

## 2018-05-09 VITALS — BP 169/78 | HR 65 | Temp 97.9°F | Ht 66.0 in | Wt 264.0 lb

## 2018-05-09 DIAGNOSIS — I10 Essential (primary) hypertension: Secondary | ICD-10-CM | POA: Diagnosis not present

## 2018-05-09 DIAGNOSIS — Z6841 Body Mass Index (BMI) 40.0 and over, adult: Secondary | ICD-10-CM

## 2018-05-09 DIAGNOSIS — E119 Type 2 diabetes mellitus without complications: Secondary | ICD-10-CM

## 2018-05-14 NOTE — Progress Notes (Signed)
Office: [phone]  /  Fax: [phone]   HPI:   Chief Complaint: OBESITY [name] is here to discuss her progress with her obesity treatment plan. She is on the portion control better and make smarter food choices, such as increase vegetables and decrease simple carbohydrates and is following her eating plan approximately 0 % of the time. She states she is exercising 0 minutes 0 times per week. [name] reports that she has been eating out a lot more, especially at [facility]. She is doing some stress eating due to her husband at home not taking his medications as prescribed.  Her weight is 264 lb (119.7 kg) today and has gained 4 pounds since her last visit. She has lost 16 lbs since starting treatment with [name].  Diabetes II [name] has a diagnosis of diabetes type II. [name] is on metformin and she denies nausea, vomiting, or diarrhea. She denies polyphagia or hypoglycemia. Last A1c was 6.1. She has been working on intensive lifestyle modifications including diet, exercise, and weight loss to help control her blood glucose levels.  Hypertension [name] is a 72 y.o. female with hypertension. [name]'s blood pressure is elevated and she denies chest pain. She is on hydrochlorothiazide and she did not take her mediations this morning. She is working weight loss to help control her blood pressure with the goal of decreasing her risk of heart attack and stroke. [name]'s blood pressure is not currently controlled.  ALLERGIES: Allergies  Allergen Reactions  . Cymbalta [Duloxetine Hcl] Other (See Comments)    Stomach pain  . Gabapentin Itching  . Lipitor [Atorvastatin] Other (See Comments)    Cramps  . Lisinopril Swelling  . Maxzide [Hydrochlorothiazide W-Triamterene] Other (See Comments)    Cramping   . Pravastatin Sodium Other (See Comments)    Headache   . Simvastatin Other (See Comments)    Memory loss  . Lodine [Etodolac] Itching    MEDICATIONS: Current Outpatient Medications on File  Prior to Visit  Medication Sig Dispense Refill  . amitriptyline (ELAVIL) 25 MG tablet Take 25 mg by mouth every morning.     [name] aspirin 81 MG tablet Take 81 mg by mouth daily.    [name] atorvastatin (LIPITOR) 20 MG tablet Take 20 mg by mouth once a week.    . blood glucose meter kit and supplies KIT Dispense based on patient and insurance preference. Use up to four times daily as directed. (FOR ICD-9 250.00, 250.01). 1 each 0  . DUREZOL 0.05 % EMUL Place 1 drop into the left eye daily.  0  . escitalopram (LEXAPRO) 10 MG tablet Take 10 mg by mouth daily.    . ferrous sulfate 325 (65 FE) MG tablet Take 1 tablet (325 mg total) by mouth daily with breakfast. 30 tablet 0  . fluticasone (CUTIVATE) 0.05 % cream Apply 1 application topically 2 (two) times daily.  0  . hydrochlorothiazide (HYDRODIURIL) 25 MG tablet Take 1 tablet (25 mg total) by mouth daily. 30 tablet 0  . hydrOXYzine (ATARAX/VISTARIL) 10 MG tablet Take 10 mg by mouth 3 (three) times daily as needed.    . irbesartan (AVAPRO) 300 MG tablet Take 1 tablet (300 mg total) by mouth at bedtime. 30 tablet 0  . ketoconazole (NIZORAL) 2 % cream Apply 1 application topically daily. 15 g 0  . metFORMIN (GLUCOPHAGE) 500 MG tablet take 1 tablet by mouth once daily with BREAKFAST 30 tablet 0  . niacin 500 MG CR capsule Take 500 mg by mouth 2 (  two) times daily with a meal.    . Omega-3 Fatty Acids (FISH OIL) 1200 MG CAPS Take 500 mg by mouth daily.     [name] tiZANidine (ZANAFLEX) 2 MG tablet Take 2 mg by mouth at bedtime.    . verapamil (VERELAN PM) 240 MG 24 hr capsule Take 240 mg by mouth daily.     . Vitamin D, Cholecalciferol, 400 UNITS TABS Take 400 Units by mouth at bedtime.    . Vitamin D, Ergocalciferol, (DRISDOL) 50000 units CAPS capsule Take 1 capsule (50,000 Units total) by mouth every 7 (seven) days. 4 capsule 0   No current facility-administered medications on file prior to visit.     PAST MEDICAL HISTORY: Past Medical History:  Diagnosis Date    . Anemia   . Anxiety    takes Xanax daily  . Arthritis   . Back pain   . Blood transfusion   . Breast cancer ([place]) 2013   left breast  . Bronchitis    hx of  . Chronic fatigue syndrome   . Depression    takes Lexapro daily  . Diabetes mellitus without complication ([place])    boderline  . Diverticulosis   . Edema    feet and legs  . Headache(784.0)   . History of colon polyps 1998   adenomatous  . Hyperlipidemia    takes Niacin daily  . Hypertension    takes Verapamil and Avapro daily  . Joint pain   . OSA (obstructive sleep apnea)   . Personal history of radiation therapy   . Pneumonia 4/12   Albuterol daily as needed  . Radiation 10/29/11-11/24/11   left breast 6100 cGy  . Shortness of breath    with exertion  . Spinal stenosis   . Tinnitus     PAST SURGICAL HISTORY: Past Surgical History:  Procedure Laterality Date  . APPENDECTOMY  1978  . BACK SURGERY    . BELPHAROPTOSIS REPAIR    . BREAST EXCISIONAL BIOPSY Right 02/06/2013  . BREAST LUMPECTOMY  08/2011   left  . BREAST LUMPECTOMY WITH NEEDLE LOCALIZATION Right 02/06/2013   Procedure: RIGHT BREAST LUMPECTOMY WITH NEEDLE LOCALIZATION;  Surgeon: Harl Bowie, MD;  Location: [facility];  Service: General;  Laterality: Right;  . COLONOSCOPY W/ POLYPECTOMY    . COLONOSCOPY WITH PROPOFOL N/A 10/27/2015   Procedure: COLONOSCOPY WITH PROPOFOL;  Surgeon: Mauri Pole, MD;  Location: [facility];  Service: Endoscopy;  Laterality: N/A;  . HARDWARE REMOVAL Left 04/20/2015   Procedure: HARDWARE REMOVAL LEFT LUMBAR FIVE SCREW;  Surgeon: Karie Chimera, MD;  Location: [facility];  Service: Neurosurgery;  Laterality: Left;  . JOINT REPLACEMENT Bilateral    bilateral knee  . POLYPECTOMY  1990's  . THYROID CYST EXCISION  1994  . TOTAL KNEE ARTHROPLASTY  2006   bilateral  . TRANSPHENOIDAL / TRANSNASAL HYPOPHYSECTOMY / RESECTION PITUITARY TUMOR  6/11    SOCIAL HISTORY: Social History   Tobacco Use  . Smoking status:  Never Smoker  . Smokeless tobacco: Never Used  Substance Use Topics  . Alcohol use: No  . Drug use: No    FAMILY HISTORY: Family History  Problem Relation Age of Onset  . Heart disease Father   . Clotting disorder Father   . Stroke Father   . Hypertension Father   . Heart Problems Father   . Stroke Mother   . Diabetes Mother   . Hypertension Mother   . Hyperlipidemia Mother   . Obesity Mother   .  Cancer Sister 75       Breast Cancer  . Breast cancer Sister 38  . Colon cancer Neg Hx   . Neuropathy Neg Hx     ROS: Review of Systems  Constitutional: Negative for weight loss.  Cardiovascular: Negative for chest pain.  Gastrointestinal: Negative for diarrhea, nausea and vomiting.  Endo/Heme/Allergies:       Negative polyphagia Negative hypoglycemia    PHYSICAL EXAM: Blood pressure (!) 169/78, pulse 65, temperature 97.9 F (36.6 C), temperature source Oral, height '5\' 6"'  (1.676 m), weight 264 lb (119.7 kg), SpO2 100 %. Body mass index is 42.61 kg/m. Physical Exam  Constitutional: [name], place, and time. She appears well-developed and well-nourished.  Cardiovascular: Normal rate.  Pulmonary/Chest: Effort normal.  Musculoskeletal: Normal range of motion.  Neurological: [name], place, and time.  Skin: Skin is warm and dry.  Psychiatric: She has a normal mood and affect. Her behavior is normal.  Vitals reviewed.   RECENT LABS AND TESTS: BMET    Component Value Date/Time   NA 144 03/28/2018 0941   NA 140 06/01/2016 1143   K 4.4 03/28/2018 0941   K 4.0 06/01/2016 1143   CL 104 03/28/2018 0941   CL 105 12/09/2012 1423   CO2 24 03/28/2018 0941   CO2 26 06/01/2016 1143   GLUCOSE 97 03/28/2018 0941   GLUCOSE 126 08/30/2017 0736   GLUCOSE 121 06/01/2016 1143   GLUCOSE 177 (H) 12/09/2012 1423   BUN 19 03/28/2018 0941   BUN 12.5 06/01/2016 1143   CREATININE 1.19 (H) 03/28/2018 0941   CREATININE 1.17 (H) 08/30/2017 0736    CREATININE 1.1 06/01/2016 1143   CALCIUM 10.2 03/28/2018 0941   CALCIUM 10.1 06/01/2016 1143   GFRNONAA 45 (L) 03/28/2018 0941   GFRNONAA 45 (L) 08/30/2017 0736   GFRAA 52 (L) 03/28/2018 0941   GFRAA 52 (L) 08/30/2017 0736   Lab Results  Component Value Date   HGBA1C 6.1 (H) 03/28/2018   HGBA1C 6.8 (H) 05/10/2017   HGBA1C 6.2 (H) 12/14/2016   HGBA1C 6.3 (H) 08/31/2016   HGBA1C (H) 11/11/2010    6.7 (NOTE)                                                                       According to the ADA Clinical Practice Recommendations for 2011, when HbA1c is used as a screening test:   >=6.5%   Diagnostic of Diabetes Mellitus           (if abnormal result  is confirmed)  5.7-6.4%   Increased risk of developing Diabetes Mellitus  References:Diagnosis and Classification of Diabetes Mellitus,Diabetes FFMB,8466,59(DJTTS 1):S62-S69 and Standards of Medical Care in         Diabetes - 2011,Diabetes Care,2011,34  (Suppl 1):S11-S61.   Lab Results  Component Value Date   INSULIN 20.7 03/28/2018   INSULIN 10.5 05/10/2017   INSULIN 20.9 12/14/2016   INSULIN 23.5 08/31/2016   CBC    Component Value Date/Time   WBC 7.1 08/30/2017 0736   WBC 14.5 (H) 08/21/2016 2027   RBC 4.05 08/30/2017 0736   HGB 10.7 (L) 08/30/2017 0736   HGB 11.0 (L) 05/10/2017 1006   HGB 11.1 (L) 06/01/2016  1143   HCT 32.7 (L) 08/30/2017 0736   HCT 34.4 05/10/2017 1006   HCT 34.9 06/01/2016 1143   PLT 243 08/30/2017 0736   PLT 248 06/01/2016 1143   MCV 80.7 08/30/2017 0736   MCV 80 05/10/2017 1006   MCV 81.2 06/01/2016 1143   MCH 26.3 08/30/2017 0736   MCHC 32.6 08/30/2017 0736   RDW 15.8 (H) 08/30/2017 0736   RDW 16.5 (H) 05/10/2017 1006   RDW 15.8 (H) 06/01/2016 1143   LYMPHSABS 1.8 08/30/2017 0736   LYMPHSABS 1.3 05/10/2017 1006   LYMPHSABS 1.8 06/01/2016 1143   MONOABS 0.5 08/30/2017 0736   MONOABS 0.6 06/01/2016 1143   EOSABS 0.1 08/30/2017 0736   EOSABS 0.1 05/10/2017 1006   BASOSABS 0.0 08/30/2017 0736    BASOSABS 0.0 05/10/2017 1006   BASOSABS 0.0 06/01/2016 1143   Iron/TIBC/Ferritin/ %Sat    Component Value Date/Time   IRON 55 12/14/2016 1146   TIBC 329 12/14/2016 1146   FERRITIN 479 (H) 12/14/2016 1146   FERRITIN 362 (H) 04/15/2015 1058   IRONPCTSAT 17 12/14/2016 1146   Lipid Panel     Component Value Date/Time   CHOL 230 (H) 03/28/2018 0941   TRIG 125 03/28/2018 0941   HDL 41 03/28/2018 0941   CHOLHDL 6.4 11/11/2010 0555   VLDL 17 11/11/2010 0555   LDLCALC 164 (H) 03/28/2018 0941   Hepatic Function Panel     Component Value Date/Time   PROT 7.1 03/28/2018 0941   PROT 7.6 06/01/2016 1143   ALBUMIN 4.0 03/28/2018 0941   ALBUMIN 3.2 (L) 06/01/2016 1143   AST 12 03/28/2018 0941   AST 10 08/30/2017 0736   AST 11 06/01/2016 1143   ALT 14 03/28/2018 0941   ALT 9 08/30/2017 0736   ALT 12 06/01/2016 1143   ALKPHOS 71 03/28/2018 0941   ALKPHOS 69 06/01/2016 1143   BILITOT 0.4 03/28/2018 0941   BILITOT 0.4 08/30/2017 0736   BILITOT 0.28 06/01/2016 1143     ASSESSMENT AND PLAN: Type 2 diabetes mellitus without complication, without long-term current use of insulin (HCC)  Essential hypertension  Class 3 severe obesity with serious comorbidity and body mass index (BMI) of 40.0 to 44.9 in adult, unspecified obesity type ([place])  PLAN:  Diabetes II Shahida has been given extensive diabetes education by myself today including ideal fasting and post-prandial blood glucose readings, individual ideal Hgb A1c goals and hypoglycemia prevention. We discussed the importance of good blood sugar control to decrease the likelihood of diabetic complications such as nephropathy, neuropathy, limb loss, blindness, coronary artery disease, and death. We discussed the importance of intensive lifestyle modification including diet, exercise and weight loss as the first line treatment for diabetes. [name] agrees to continue taking metformin, diet, and weight loss, and she agrees to follow up with  our clinic in 3 weeks.  Hypertension We discussed sodium restriction, working on healthy weight loss, and a regular exercise program as the means to achieve improved blood pressure control. [name] agreed with this plan and agreed to follow up as directed. We will continue to monitor her blood pressure as well as her progress with the above lifestyle modifications. [name] will continue with diet and weight loss, and she will continue her medications and will watch for signs of hypotension as she continues her lifestyle modifications. [name] agrees to follow up with our clinic in 3 weeks.  Obesity [name] is currently in the action stage of change. As such, her goal is to continue with weight  loss efforts She has agreed to portion control better and make smarter food choices, such as increase vegetables and decrease simple carbohydrates  [name] has been instructed to work up to a goal of 150 minutes of combined cardio and strengthening exercise per week for weight loss and overall health benefits. We discussed the following Behavioral Modification Strategies today: increasing lean protein intake, decrease eating out, and no skipping meals   [name] has agreed to follow up with our clinic in 3 weeks. She was informed of the importance of frequent follow up visits to maximize her success with intensive lifestyle modifications for her multiple health conditions.   OBESITY BEHAVIORAL INTERVENTION VISIT  Today's visit was # 67  Starting weight: 280 lbs Starting date: 08/31/16 Today's weight : 264 lbs  Today's date: 05/09/2018 Total lbs lost to date: 16 At least 15 minutes were spent on discussing the following behavioral intervention visit.   ASK: We discussed the diagnosis of obesity with [name] today and [name] agreed to give [name] permission to discuss obesity behavioral modification therapy today.  ASSESS: [name] has the diagnosis of obesity and her BMI today is 42.63 [name] is in the action  stage of change   ADVISE: [name] was educated on the multiple health risks of obesity as well as the benefit of weight loss to improve her health. She was advised of the need for long term treatment and the importance of lifestyle modifications.  AGREE: Multiple dietary modification options and treatment options were discussed and  [name] agreed to the above obesity treatment plan.  [name], am acting as transcriptionist for [name], PA-C I, [name], PA-C have reviewed above note and agree with its content

## 2018-05-28 ENCOUNTER — Other Ambulatory Visit: Payer: Self-pay | Admitting: Orthopedic Surgery

## 2018-05-28 DIAGNOSIS — M25511 Pain in right shoulder: Principal | ICD-10-CM

## 2018-05-28 DIAGNOSIS — G8929 Other chronic pain: Secondary | ICD-10-CM

## 2018-05-28 DIAGNOSIS — M542 Cervicalgia: Secondary | ICD-10-CM

## 2018-05-30 ENCOUNTER — Ambulatory Visit (INDEPENDENT_AMBULATORY_CARE_PROVIDER_SITE_OTHER): Payer: Medicare Other | Admitting: Physician Assistant

## 2018-05-30 ENCOUNTER — Encounter (INDEPENDENT_AMBULATORY_CARE_PROVIDER_SITE_OTHER): Payer: Self-pay | Admitting: Physician Assistant

## 2018-05-30 VITALS — BP 156/77 | HR 75 | Temp 98.5°F | Ht 66.0 in | Wt 266.0 lb

## 2018-05-30 DIAGNOSIS — Z6841 Body Mass Index (BMI) 40.0 and over, adult: Secondary | ICD-10-CM | POA: Diagnosis not present

## 2018-05-30 DIAGNOSIS — E119 Type 2 diabetes mellitus without complications: Secondary | ICD-10-CM

## 2018-05-30 DIAGNOSIS — E559 Vitamin D deficiency, unspecified: Secondary | ICD-10-CM

## 2018-05-30 DIAGNOSIS — I708 Atherosclerosis of other arteries: Secondary | ICD-10-CM | POA: Diagnosis not present

## 2018-05-30 DIAGNOSIS — I10 Essential (primary) hypertension: Secondary | ICD-10-CM

## 2018-05-30 DIAGNOSIS — H35372 Puckering of macula, left eye: Secondary | ICD-10-CM | POA: Diagnosis not present

## 2018-05-30 DIAGNOSIS — H35033 Hypertensive retinopathy, bilateral: Secondary | ICD-10-CM | POA: Diagnosis not present

## 2018-05-30 DIAGNOSIS — H353132 Nonexudative age-related macular degeneration, bilateral, intermediate dry stage: Secondary | ICD-10-CM | POA: Diagnosis not present

## 2018-05-30 MED ORDER — HYDROCHLOROTHIAZIDE 25 MG PO TABS: 25.0000 mg | ORAL_TABLET | Freq: Every day | ORAL | 0 refills | Status: DC

## 2018-05-30 MED ORDER — METFORMIN HCL 500 MG PO TABS: ORAL_TABLET | ORAL | 0 refills | Status: DC

## 2018-05-30 MED ORDER — VITAMIN D (ERGOCALCIFEROL) 1.25 MG (50000 UNIT) PO CAPS: 50000.0000 [IU] | ORAL_CAPSULE | ORAL | 0 refills | Status: DC

## 2018-05-30 NOTE — Progress Notes (Signed)
Office: [phone]  /  Fax: [phone]   HPI:   Chief Complaint: OBESITY [name] is here to discuss her progress with her obesity treatment plan. She is on the portion control better and make smarter food choices, such as increase vegetables and decrease simple carbohydrates and is following her eating plan approximately 60 % of the time. She states she is exercising 0 minutes 0 times per week. [name] reports that she ate out more often over the last few weeks. She is not getting enough protein in. She reports that she does not like cooking and she is looking for more convenient options.  Her weight is 266 lb (120.7 kg) today and has gained 2 pounds since her last visit. She has lost 14 lbs since starting treatment with [name].  Diabetes II [name] has a diagnosis of diabetes type II. [name] denies polyphagia or hypoglycemia. She is on metformin and denies nausea, vomiting, or diarrhea. Last A1c was 6.1. She has been working on intensive lifestyle modifications including diet, exercise, and weight loss to help control her blood glucose levels.  Hypertension [name] is a 72 y.o. female with hypertension. [name]'s blood pressure is slightly elevated and she denies chest pain. She is working weight loss to help control her blood pressure with the goal of decreasing her risk of heart attack and stroke. [name]'s blood pressure is not currently controlled.  Vitamin D Deficiency [name] has a diagnosis of vitamin D deficiency. She is currently taking prescription Vit D. She denies nausea, vomiting or muscle weakness.  ALLERGIES: Allergies  Allergen Reactions  . Cymbalta [Duloxetine Hcl] Other (See Comments)    Stomach pain  . Gabapentin Itching  . Lipitor [Atorvastatin] Other (See Comments)    Cramps  . Lisinopril Swelling  . Maxzide [Hydrochlorothiazide W-Triamterene] Other (See Comments)    Cramping   . Pravastatin Sodium Other (See Comments)    Headache   . Simvastatin Other (See  Comments)    Memory loss  . Lodine [Etodolac] Itching    MEDICATIONS: Current Outpatient Medications on File Prior to Visit  Medication Sig Dispense Refill  . amitriptyline (ELAVIL) 25 MG tablet Take 25 mg by mouth every morning.     [name] aspirin 81 MG tablet Take 81 mg by mouth daily.    [name] atorvastatin (LIPITOR) 20 MG tablet Take 20 mg by mouth once a week.    . blood glucose meter kit and supplies KIT Dispense based on patient and insurance preference. Use up to four times daily as directed. (FOR ICD-9 250.00, 250.01). 1 each 0  . DUREZOL 0.05 % EMUL Place 1 drop into the left eye daily.  0  . escitalopram (LEXAPRO) 10 MG tablet Take 10 mg by mouth daily.    . ferrous sulfate 325 (65 FE) MG tablet Take 1 tablet (325 mg total) by mouth daily with breakfast. 30 tablet 0  . fluticasone (CUTIVATE) 0.05 % cream Apply 1 application topically 2 (two) times daily.  0  . hydrOXYzine (ATARAX/VISTARIL) 10 MG tablet Take 10 mg by mouth 3 (three) times daily as needed.    . irbesartan (AVAPRO) 300 MG tablet Take 1 tablet (300 mg total) by mouth at bedtime. 30 tablet 0  . ketoconazole (NIZORAL) 2 % cream Apply 1 application topically daily. 15 g 0  . niacin 500 MG CR capsule Take 500 mg by mouth 2 (two) times daily with a meal.    . Omega-3 Fatty Acids (FISH OIL) 1200 MG CAPS Take 500 mg  by mouth daily.     [name] tiZANidine (ZANAFLEX) 2 MG tablet Take 2 mg by mouth at bedtime.    . verapamil (VERELAN PM) 240 MG 24 hr capsule Take 240 mg by mouth daily.     . Vitamin D, Cholecalciferol, 400 UNITS TABS Take 400 Units by mouth at bedtime.     No current facility-administered medications on file prior to visit.     PAST MEDICAL HISTORY: Past Medical History:  Diagnosis Date  . Anemia   . Anxiety    takes Xanax daily  . Arthritis   . Back pain   . Blood transfusion   . Breast cancer ([name]) 2013   left breast  . Bronchitis    hx of  . Chronic fatigue syndrome   . Depression    takes Lexapro daily    . Diabetes mellitus without complication ([place])    boderline  . Diverticulosis   . Edema    feet and legs  . Headache(784.0)   . History of colon polyps 1998   adenomatous  . Hyperlipidemia    takes Niacin daily  . Hypertension    takes Verapamil and Avapro daily  . Joint pain   . OSA (obstructive sleep apnea)   . Personal history of radiation therapy   . Pneumonia 4/12   Albuterol daily as needed  . Radiation 10/29/11-11/24/11   left breast 6100 cGy  . Shortness of breath    with exertion  . Spinal stenosis   . Tinnitus     PAST SURGICAL HISTORY: Past Surgical History:  Procedure Laterality Date  . APPENDECTOMY  1978  . BACK SURGERY    . BELPHAROPTOSIS REPAIR    . BREAST EXCISIONAL BIOPSY Right 02/06/2013  . BREAST LUMPECTOMY  08/2011   left  . BREAST LUMPECTOMY WITH NEEDLE LOCALIZATION Right 02/06/2013   Procedure: RIGHT BREAST LUMPECTOMY WITH NEEDLE LOCALIZATION;  Surgeon: Harl Bowie, MD;  Location: [facility];  Service: General;  Laterality: Right;  . COLONOSCOPY W/ POLYPECTOMY    . COLONOSCOPY WITH PROPOFOL N/A 10/27/2015   Procedure: COLONOSCOPY WITH PROPOFOL;  Surgeon: Mauri Pole, MD;  Location: [facility];  Service: Endoscopy;  Laterality: N/A;  . HARDWARE REMOVAL Left 04/20/2015   Procedure: HARDWARE REMOVAL LEFT LUMBAR FIVE SCREW;  Surgeon: Karie Chimera, MD;  Location: [facility];  Service: Neurosurgery;  Laterality: Left;  . JOINT REPLACEMENT Bilateral    bilateral knee  . POLYPECTOMY  1990's  . THYROID CYST EXCISION  1994  . TOTAL KNEE ARTHROPLASTY  2006   bilateral  . TRANSPHENOIDAL / TRANSNASAL HYPOPHYSECTOMY / RESECTION PITUITARY TUMOR  6/11    SOCIAL HISTORY: Social History   Tobacco Use  . Smoking status: Never Smoker  . Smokeless tobacco: Never Used  Substance Use Topics  . Alcohol use: No  . Drug use: No    FAMILY HISTORY: Family History  Problem Relation Age of Onset  . Heart disease Father   . Clotting disorder Father   .  Stroke Father   . Hypertension Father   . Heart Problems Father   . Stroke Mother   . Diabetes Mother   . Hypertension Mother   . Hyperlipidemia Mother   . Obesity Mother   . Cancer Sister 41       Breast Cancer  . Breast cancer Sister 76  . Colon cancer Neg Hx   . Neuropathy Neg Hx     ROS: Review of Systems  Constitutional: Negative for weight loss.  Cardiovascular: Negative for chest pain.  Gastrointestinal: Negative for diarrhea, nausea and vomiting.  Musculoskeletal:       Negative muscle weakness  Endo/Heme/Allergies:       Negative polyphagia Negative hypoglycemia    PHYSICAL EXAM: Blood pressure (!) 156/77, pulse 75, temperature 98.5 F (36.9 C), temperature source Oral, height _0  (1.676 m), weight 266 lb (120.7 kg), SpO2 99 %. Body mass index is 42.93 kg/m. Physical Exam  Constitutional: [name], place, and time. She appears well-developed and well-nourished.  Cardiovascular: Normal rate.  Pulmonary/Chest: Effort normal.  Musculoskeletal: Normal range of motion.  Neurological: [name], place, and time.  Skin: Skin is warm and dry.  Psychiatric: She has a normal mood and affect. Her behavior is normal.  Vitals reviewed.   RECENT LABS AND TESTS: BMET    Component Value Date/Time   NA 144 03/28/2018 0941   NA 140 06/01/2016 1143   K 4.4 03/28/2018 0941   K 4.0 06/01/2016 1143   CL 104 03/28/2018 0941   CL 105 12/09/2012 1423   CO2 24 03/28/2018 0941   CO2 26 06/01/2016 1143   GLUCOSE 97 03/28/2018 0941   GLUCOSE 126 08/30/2017 0736   GLUCOSE 121 06/01/2016 1143   GLUCOSE 177 (H) 12/09/2012 1423   BUN 19 03/28/2018 0941   BUN 12.5 06/01/2016 1143   CREATININE 1.19 (H) 03/28/2018 0941   CREATININE 1.17 (H) 08/30/2017 0736   CREATININE 1.1 06/01/2016 1143   CALCIUM 10.2 03/28/2018 0941   CALCIUM 10.1 06/01/2016 1143   GFRNONAA 45 (L) 03/28/2018 0941   GFRNONAA 45 (L) 08/30/2017 0736   GFRAA 52 (L) 03/28/2018  0941   GFRAA 52 (L) 08/30/2017 0736   Lab Results  Component Value Date   HGBA1C 6.1 (H) 03/28/2018   HGBA1C 6.8 (H) 05/10/2017   HGBA1C 6.2 (H) 12/14/2016   HGBA1C 6.3 (H) 08/31/2016   HGBA1C (H) 11/11/2010    6.7 (NOTE)                                                                       According to the ADA Clinical Practice Recommendations for 2011, when HbA1c is used as a screening test:   >=6.5%   Diagnostic of Diabetes Mellitus           (if abnormal result  is confirmed)  5.7-6.4%   Increased risk of developing Diabetes Mellitus  References:Diagnosis and Classification of Diabetes Mellitus,Diabetes GYJE,5631,49(FWYOV 1):S62-S69 and Standards of Medical Care in         Diabetes - 2011,Diabetes Care,2011,34  (Suppl 1):S11-S61.   Lab Results  Component Value Date   INSULIN 20.7 03/28/2018   INSULIN 10.5 05/10/2017   INSULIN 20.9 12/14/2016   INSULIN 23.5 08/31/2016   CBC    Component Value Date/Time   WBC 7.1 08/30/2017 0736   WBC 14.5 (H) 08/21/2016 2027   RBC 4.05 08/30/2017 0736   HGB 10.7 (L) 08/30/2017 0736   HGB 11.0 (L) 05/10/2017 1006   HGB 11.1 (L) 06/01/2016 1143   HCT 32.7 (L) 08/30/2017 0736   HCT 34.4 05/10/2017 1006   HCT 34.9 06/01/2016 1143   PLT 243 08/30/2017 0736   PLT 248 06/01/2016 1143  MCV 80.7 08/30/2017 0736   MCV 80 05/10/2017 1006   MCV 81.2 06/01/2016 1143   MCH 26.3 08/30/2017 0736   MCHC 32.6 08/30/2017 0736   RDW 15.8 (H) 08/30/2017 0736   RDW 16.5 (H) 05/10/2017 1006   RDW 15.8 (H) 06/01/2016 1143   LYMPHSABS 1.8 08/30/2017 0736   LYMPHSABS 1.3 05/10/2017 1006   LYMPHSABS 1.8 06/01/2016 1143   MONOABS 0.5 08/30/2017 0736   MONOABS 0.6 06/01/2016 1143   EOSABS 0.1 08/30/2017 0736   EOSABS 0.1 05/10/2017 1006   BASOSABS 0.0 08/30/2017 0736   BASOSABS 0.0 05/10/2017 1006   BASOSABS 0.0 06/01/2016 1143   Iron/TIBC/Ferritin/ %Sat    Component Value Date/Time   IRON 55 12/14/2016 1146   TIBC 329 12/14/2016 1146   FERRITIN  479 (H) 12/14/2016 1146   FERRITIN 362 (H) 04/15/2015 1058   IRONPCTSAT 17 12/14/2016 1146   Lipid Panel     Component Value Date/Time   CHOL 230 (H) 03/28/2018 0941   TRIG 125 03/28/2018 0941   HDL 41 03/28/2018 0941   CHOLHDL 6.4 11/11/2010 0555   VLDL 17 11/11/2010 0555   LDLCALC 164 (H) 03/28/2018 0941   Hepatic Function Panel     Component Value Date/Time   PROT 7.1 03/28/2018 0941   PROT 7.6 06/01/2016 1143   ALBUMIN 4.0 03/28/2018 0941   ALBUMIN 3.2 (L) 06/01/2016 1143   AST 12 03/28/2018 0941   AST 10 08/30/2017 0736   AST 11 06/01/2016 1143   ALT 14 03/28/2018 0941   ALT 9 08/30/2017 0736   ALT 12 06/01/2016 1143   ALKPHOS 71 03/28/2018 0941   ALKPHOS 69 06/01/2016 1143   BILITOT 0.4 03/28/2018 0941   BILITOT 0.4 08/30/2017 0736   BILITOT 0.28 06/01/2016 1143   Results for TASMIA, BLUMER (MRN [id number]) as of 05/30/2018 13:15  Ref. Range 03/28/2018 09:41  Vitamin D, 25-Hydroxy Latest Ref Range: 30.0 - 100.0 ng/mL 41.9    ASSESSMENT AND PLAN: Type 2 diabetes mellitus without complication, without long-term current use of insulin (HCC) - Plan: metFORMIN (GLUCOPHAGE) 500 MG tablet  Essential hypertension - Plan: hydrochlorothiazide (HYDRODIURIL) 25 MG tablet  Vitamin D deficiency - Plan: Vitamin D, Ergocalciferol, (DRISDOL) 50000 units CAPS capsule  Class 3 severe obesity with serious comorbidity and body mass index (BMI) of 40.0 to 44.9 in adult, unspecified obesity type ([place])  PLAN:  Diabetes II Kaziyah has been given extensive diabetes education by myself today including ideal fasting and post-prandial blood glucose readings, individual ideal Hgb A1c goals and hypoglycemia prevention. We discussed the importance of good blood sugar control to decrease the likelihood of diabetic complications such as nephropathy, neuropathy, limb loss, blindness, coronary artery disease, and death. We discussed the importance of intensive lifestyle modification including  diet, exercise and weight loss as the first line treatment for diabetes. Laureen agrees to continue taking metformin 500 mg q AM #30 and we will refill for 1 month. [name] agrees to follow up with our clinic in 3 weeks.  Hypertension We discussed sodium restriction, working on healthy weight loss, and a regular exercise program as the means to achieve improved blood pressure control. [name] agreed with this plan and agreed to follow up as directed. We will continue to monitor her blood pressure as well as her progress with the above lifestyle modifications. Alsie agrees to continue taking hydrochlorothiazide 25 mg qd #30 and we will refill for 1 month. She will watch for signs of hypotension as she continues her lifestyle  modifications. [name] agrees to follow up with our clinic in 3 weeks.  Vitamin D Deficiency [name] was informed that low vitamin D levels contributes to fatigue and are associated with obesity, breast, and colon cancer. [name] agrees to continue taking prescription Vit D _0 ,000 IU every week #4 and we will refill for 1 month. She will follow up for routine testing of vitamin D, at least 2-3 times per year. She was informed of the risk of over-replacement of vitamin D and agrees to not increase her dose unless she discusses this with [name] first. [name] agrees to follow up with our clinic in 3 weeks.  Obesity [name] is currently in the action stage of change. As such, her goal is to continue with weight loss efforts She has agreed to portion control better and make smarter food choices, such as increase vegetables and decrease simple carbohydrates  [name] has been instructed to work up to a goal of 150 minutes of combined cardio and strengthening exercise per week for weight loss and overall health benefits. We discussed the following Behavioral Modification Strategies today: increasing lean protein intake and work on meal planning and easy cooking plans   [name] has agreed to follow up with our  clinic in 3 weeks. She was informed of the importance of frequent follow up visits to maximize her success with intensive lifestyle modifications for her multiple health conditions.   OBESITY BEHAVIORAL INTERVENTION VISIT  Today's visit was # 40  Starting weight: 280 lbs Starting date: 08/31/16 Today's weight : 266 lbs Today's date: 05/30/2018 Total lbs lost to date: 14 At least 15 minutes were spent on discussing the following behavioral intervention visit.   ASK: We discussed the diagnosis of obesity with [name] today and [name] agreed to give [name] permission to discuss obesity behavioral modification therapy today.  ASSESS: [name] has the diagnosis of obesity and her BMI today is 42.95 [name] is in the action stage of change   ADVISE: [name] was educated on the multiple health risks of obesity as well as the benefit of weight loss to improve her health. She was advised of the need for long term treatment and the importance of lifestyle modifications.  AGREE: Multiple dietary modification options and treatment options were discussed and  Dorris agreed to the above obesity treatment plan.  Wilhemena Durie, am acting as transcriptionist for [name], PA-C I, [name], PA-C have reviewed above note and agree with its content

## 2018-06-11 ENCOUNTER — Ambulatory Visit
Admission: RE | Admit: 2018-06-11 | Discharge: 2018-06-11 | Disposition: A | Discharge status: Home / Self Care | Payer: Medicare Other | Source: Ambulatory Visit | Attending: Orthopedic Surgery | Admitting: Orthopedic Surgery

## 2018-06-11 DIAGNOSIS — M542 Cervicalgia: Secondary | ICD-10-CM

## 2018-06-11 DIAGNOSIS — M25511 Pain in right shoulder: Secondary | ICD-10-CM | POA: Diagnosis not present

## 2018-06-11 DIAGNOSIS — M4802 Spinal stenosis, cervical region: Secondary | ICD-10-CM | POA: Diagnosis not present

## 2018-06-11 DIAGNOSIS — G8929 Other chronic pain: Secondary | ICD-10-CM

## 2018-06-20 ENCOUNTER — Encounter (INDEPENDENT_AMBULATORY_CARE_PROVIDER_SITE_OTHER): Payer: Self-pay | Admitting: Physician Assistant

## 2018-06-20 ENCOUNTER — Ambulatory Visit (INDEPENDENT_AMBULATORY_CARE_PROVIDER_SITE_OTHER): Payer: Medicare Other | Admitting: Physician Assistant

## 2018-06-20 VITALS — BP 157/83 | HR 79 | Temp 97.8°F | Ht 66.0 in | Wt 260.0 lb

## 2018-06-20 DIAGNOSIS — E1140 Type 2 diabetes mellitus with diabetic neuropathy, unspecified: Secondary | ICD-10-CM

## 2018-06-20 DIAGNOSIS — E559 Vitamin D deficiency, unspecified: Secondary | ICD-10-CM | POA: Diagnosis not present

## 2018-06-20 DIAGNOSIS — I10 Essential (primary) hypertension: Secondary | ICD-10-CM | POA: Diagnosis not present

## 2018-06-20 DIAGNOSIS — Z6841 Body Mass Index (BMI) 40.0 and over, adult: Secondary | ICD-10-CM

## 2018-06-20 DIAGNOSIS — M25511 Pain in right shoulder: Secondary | ICD-10-CM | POA: Diagnosis not present

## 2018-06-20 MED ORDER — HYDROCHLOROTHIAZIDE 25 MG PO TABS: 25.0000 mg | ORAL_TABLET | Freq: Every day | ORAL | 0 refills | Status: DC

## 2018-06-20 MED ORDER — METFORMIN HCL 500 MG PO TABS: ORAL_TABLET | ORAL | 0 refills | Status: DC

## 2018-06-20 MED ORDER — VITAMIN D (ERGOCALCIFEROL) 1.25 MG (50000 UNIT) PO CAPS: 50000.0000 [IU] | ORAL_CAPSULE | ORAL | 0 refills | Status: DC

## 2018-06-25 NOTE — Progress Notes (Signed)
Office: [phone]  /  Fax: [phone]   HPI:   Chief Complaint: OBESITY [name] is here to discuss her progress with her obesity treatment plan. She is on the portion control better and make smarter food choices, such as increase vegetables and decrease simple carbohydrates and is following her eating plan approximately 30 % of the time. She states she is exercising 0 minutes 0 times per week. [name] did very well with weight loss. She reports that she traveled to [place] and ate out more than normal. She is trying to eat more protein.  Her weight is 260 lb (117.9 kg) today and has had a weight loss of 6 pounds over a period of 3 weeks since her last visit. She has lost 20 lbs since starting treatment with [name].  Hypertension [name] is a 72 y.o. female with hypertension. [name]'s blood pressure is elevated today. She is on hydrochlorothiazide and denies chest pain. She is working weight loss to help control her blood pressure with the goal of decreasing her risk of heart attack and stroke. [name]'s blood pressure is not currently controlled.  Diabetes II with Peripheral Neuropathy [name] has a diagnosis of diabetes type II. [name] is on metformin and denies nausea, vomiting, or diarrhea. She states she is not checking blood sugars and denies hypoglycemia. Last A1c was 6.1. She has been working on intensive lifestyle modifications including diet, exercise, and weight loss to help control her blood glucose levels.  Vitamin D Deficiency [name] has a diagnosis of vitamin D deficiency. She is currently taking prescription Vit D and denies nausea, vomiting or muscle weakness.  ALLERGIES: Allergies  Allergen Reactions  . Cymbalta [Duloxetine Hcl] Other (See Comments)    Stomach pain  . Gabapentin Itching  . Lipitor [Atorvastatin] Other (See Comments)    Cramps  . Lisinopril Swelling  . Maxzide [Hydrochlorothiazide W-Triamterene] Other (See Comments)    Cramping   . Pravastatin Sodium  Other (See Comments)    Headache   . Simvastatin Other (See Comments)    Memory loss  . Lodine [Etodolac] Itching    MEDICATIONS: Current Outpatient Medications on File Prior to Visit  Medication Sig Dispense Refill  . amitriptyline (ELAVIL) 25 MG tablet Take 25 mg by mouth every morning.     [name] aspirin 81 MG tablet Take 81 mg by mouth daily.    [name] atorvastatin (LIPITOR) 20 MG tablet Take 20 mg by mouth once a week.    . blood glucose meter kit and supplies KIT Dispense based on patient and insurance preference. Use up to four times daily as directed. (FOR ICD-9 250.00, 250.01). 1 each 0  . DUREZOL 0.05 % EMUL Place 1 drop into the left eye daily.  0  . escitalopram (LEXAPRO) 10 MG tablet Take 10 mg by mouth daily.    . ferrous sulfate 325 (65 FE) MG tablet Take 1 tablet (325 mg total) by mouth daily with breakfast. 30 tablet 0  . fluticasone (CUTIVATE) 0.05 % cream Apply 1 application topically 2 (two) times daily.  0  . hydrOXYzine (ATARAX/VISTARIL) 10 MG tablet Take 10 mg by mouth 3 (three) times daily as needed.    . irbesartan (AVAPRO) 300 MG tablet Take 1 tablet (300 mg total) by mouth at bedtime. 30 tablet 0  . ketoconazole (NIZORAL) 2 % cream Apply 1 application topically daily. 15 g 0  . niacin 500 MG CR capsule Take 500 mg by mouth 2 (two) times daily with a meal.    .  Omega-3 Fatty Acids (FISH OIL) 1200 MG CAPS Take 500 mg by mouth daily.     [name] tiZANidine (ZANAFLEX) 2 MG tablet Take 2 mg by mouth at bedtime.    . verapamil (VERELAN PM) 240 MG 24 hr capsule Take 240 mg by mouth daily.     . Vitamin D, Cholecalciferol, 400 UNITS TABS Take 400 Units by mouth at bedtime.     No current facility-administered medications on file prior to visit.     PAST MEDICAL HISTORY: Past Medical History:  Diagnosis Date  . Anemia   . Anxiety    takes Xanax daily  . Arthritis   . Back pain   . Blood transfusion   . Breast cancer ([place]) 2013   left breast  . Bronchitis    hx of  .  Chronic fatigue syndrome   . Depression    takes Lexapro daily  . Diabetes mellitus without complication ([place])    boderline  . Diverticulosis   . Edema    feet and legs  . Headache(784.0)   . History of colon polyps 1998   adenomatous  . Hyperlipidemia    takes Niacin daily  . Hypertension    takes Verapamil and Avapro daily  . Joint pain   . OSA (obstructive sleep apnea)   . Personal history of radiation therapy   . Pneumonia 4/12   Albuterol daily as needed  . Radiation 10/29/11-11/24/11   left breast 6100 cGy  . Shortness of breath    with exertion  . Spinal stenosis   . Tinnitus     PAST SURGICAL HISTORY: Past Surgical History:  Procedure Laterality Date  . APPENDECTOMY  1978  . BACK SURGERY    . BELPHAROPTOSIS REPAIR    . BREAST EXCISIONAL BIOPSY Right 02/06/2013  . BREAST LUMPECTOMY  08/2011   left  . BREAST LUMPECTOMY WITH NEEDLE LOCALIZATION Right 02/06/2013   Procedure: RIGHT BREAST LUMPECTOMY WITH NEEDLE LOCALIZATION;  Surgeon: Harl Bowie, MD;  Location: [facility];  Service: General;  Laterality: Right;  . COLONOSCOPY W/ POLYPECTOMY    . COLONOSCOPY WITH PROPOFOL N/A 10/27/2015   Procedure: COLONOSCOPY WITH PROPOFOL;  Surgeon: Mauri Pole, MD;  Location: [facility];  Service: Endoscopy;  Laterality: N/A;  . HARDWARE REMOVAL Left 04/20/2015   Procedure: HARDWARE REMOVAL LEFT LUMBAR FIVE SCREW;  Surgeon: Karie Chimera, MD;  Location: [facility];  Service: Neurosurgery;  Laterality: Left;  . JOINT REPLACEMENT Bilateral    bilateral knee  . POLYPECTOMY  1990's  . THYROID CYST EXCISION  1994  . TOTAL KNEE ARTHROPLASTY  2006   bilateral  . TRANSPHENOIDAL / TRANSNASAL HYPOPHYSECTOMY / RESECTION PITUITARY TUMOR  6/11    SOCIAL HISTORY: Social History   Tobacco Use  . Smoking status: Never Smoker  . Smokeless tobacco: Never Used  Substance Use Topics  . Alcohol use: No  . Drug use: No    FAMILY HISTORY: Family History  Problem Relation Age of Onset   . Heart disease Father   . Clotting disorder Father   . Stroke Father   . Hypertension Father   . Heart Problems Father   . Stroke Mother   . Diabetes Mother   . Hypertension Mother   . Hyperlipidemia Mother   . Obesity Mother   . Cancer Sister 44       Breast Cancer  . Breast cancer Sister 9  . Colon cancer Neg Hx   . Neuropathy Neg Hx  ROS: Review of Systems  Constitutional: Positive for weight loss.  Cardiovascular: Negative for chest pain.  Gastrointestinal: Negative for diarrhea, nausea and vomiting.  Musculoskeletal:       Negative muscle weakness  Endo/Heme/Allergies:       Negative hypoglycemia    PHYSICAL EXAM: Blood pressure (!) 157/83, pulse 79, temperature 97.8 F (36.6 C), temperature source Oral, height '5\' 6"'  (1.676 m), weight 260 lb (117.9 kg), SpO2 98 %. Body mass index is 41.97 kg/m. Physical Exam  Constitutional: [name], place, and time. She appears well-developed and well-nourished.  Cardiovascular: Normal rate.  Pulmonary/Chest: Effort normal.  Musculoskeletal: Normal range of motion.  Neurological: [name], place, and time.  Skin: Skin is warm and dry.  Psychiatric: She has a normal mood and affect. Her behavior is normal.  Vitals reviewed.   RECENT LABS AND TESTS: BMET    Component Value Date/Time   NA 144 03/28/2018 0941   NA 140 06/01/2016 1143   K 4.4 03/28/2018 0941   K 4.0 06/01/2016 1143   CL 104 03/28/2018 0941   CL 105 12/09/2012 1423   CO2 24 03/28/2018 0941   CO2 26 06/01/2016 1143   GLUCOSE 97 03/28/2018 0941   GLUCOSE 126 08/30/2017 0736   GLUCOSE 121 06/01/2016 1143   GLUCOSE 177 (H) 12/09/2012 1423   BUN 19 03/28/2018 0941   BUN 12.5 06/01/2016 1143   CREATININE 1.19 (H) 03/28/2018 0941   CREATININE 1.17 (H) 08/30/2017 0736   CREATININE 1.1 06/01/2016 1143   CALCIUM 10.2 03/28/2018 0941   CALCIUM 10.1 06/01/2016 1143   GFRNONAA 45 (L) 03/28/2018 0941   GFRNONAA 45 (L)  08/30/2017 0736   GFRAA 52 (L) 03/28/2018 0941   GFRAA 52 (L) 08/30/2017 0736   Lab Results  Component Value Date   HGBA1C 6.1 (H) 03/28/2018   HGBA1C 6.8 (H) 05/10/2017   HGBA1C 6.2 (H) 12/14/2016   HGBA1C 6.3 (H) 08/31/2016   HGBA1C (H) 11/11/2010    6.7 (NOTE)                                                                       According to the ADA Clinical Practice Recommendations for 2011, when HbA1c is used as a screening test:   >=6.5%   Diagnostic of Diabetes Mellitus           (if abnormal result  is confirmed)  5.7-6.4%   Increased risk of developing Diabetes Mellitus  References:Diagnosis and Classification of Diabetes Mellitus,Diabetes HCWC,3762,83(TDVVO 1):S62-S69 and Standards of Medical Care in         Diabetes - 2011,Diabetes Care,2011,34  (Suppl 1):S11-S61.   Lab Results  Component Value Date   INSULIN 20.7 03/28/2018   INSULIN 10.5 05/10/2017   INSULIN 20.9 12/14/2016   INSULIN 23.5 08/31/2016   CBC    Component Value Date/Time   WBC 7.1 08/30/2017 0736   WBC 14.5 (H) 08/21/2016 2027   RBC 4.05 08/30/2017 0736   HGB 10.7 (L) 08/30/2017 0736   HGB 11.0 (L) 05/10/2017 1006   HGB 11.1 (L) 06/01/2016 1143   HCT 32.7 (L) 08/30/2017 0736   HCT 34.4 05/10/2017 1006   HCT 34.9 06/01/2016 1143   PLT 243  08/30/2017 0736   PLT 248 06/01/2016 1143   MCV 80.7 08/30/2017 0736   MCV 80 05/10/2017 1006   MCV 81.2 06/01/2016 1143   MCH 26.3 08/30/2017 0736   MCHC 32.6 08/30/2017 0736   RDW 15.8 (H) 08/30/2017 0736   RDW 16.5 (H) 05/10/2017 1006   RDW 15.8 (H) 06/01/2016 1143   LYMPHSABS 1.8 08/30/2017 0736   LYMPHSABS 1.3 05/10/2017 1006   LYMPHSABS 1.8 06/01/2016 1143   MONOABS 0.5 08/30/2017 0736   MONOABS 0.6 06/01/2016 1143   EOSABS 0.1 08/30/2017 0736   EOSABS 0.1 05/10/2017 1006   BASOSABS 0.0 08/30/2017 0736   BASOSABS 0.0 05/10/2017 1006   BASOSABS 0.0 06/01/2016 1143   Iron/TIBC/Ferritin/ %Sat    Component Value Date/Time   IRON 55 12/14/2016 1146    TIBC 329 12/14/2016 1146   FERRITIN 479 (H) 12/14/2016 1146   FERRITIN 362 (H) 04/15/2015 1058   IRONPCTSAT 17 12/14/2016 1146   Lipid Panel     Component Value Date/Time   CHOL 230 (H) 03/28/2018 0941   TRIG 125 03/28/2018 0941   HDL 41 03/28/2018 0941   CHOLHDL 6.4 11/11/2010 0555   VLDL 17 11/11/2010 0555   LDLCALC 164 (H) 03/28/2018 0941   Hepatic Function Panel     Component Value Date/Time   PROT 7.1 03/28/2018 0941   PROT 7.6 06/01/2016 1143   ALBUMIN 4.0 03/28/2018 0941   ALBUMIN 3.2 (L) 06/01/2016 1143   AST 12 03/28/2018 0941   AST 10 08/30/2017 0736   AST 11 06/01/2016 1143   ALT 14 03/28/2018 0941   ALT 9 08/30/2017 0736   ALT 12 06/01/2016 1143   ALKPHOS 71 03/28/2018 0941   ALKPHOS 69 06/01/2016 1143   BILITOT 0.4 03/28/2018 0941   BILITOT 0.4 08/30/2017 0736   BILITOT 0.28 06/01/2016 1143    Results for INETTA, DICKE (MRN [id number]) as of 06/25/2018 11:13  Ref. Range 03/28/2018 09:41  Vitamin D, 25-Hydroxy Latest Ref Range: 30.0 - 100.0 ng/mL 41.9   ASSESSMENT AND PLAN: Essential hypertension - Plan: hydrochlorothiazide (HYDRODIURIL) 25 MG tablet  Type 2 diabetes mellitus with diabetic neuropathy, without long-term current use of insulin (HCC) - Plan: metFORMIN (GLUCOPHAGE) 500 MG tablet  Vitamin D deficiency - Plan: Vitamin D, Ergocalciferol, (DRISDOL) 1.25 MG (50000 UT) CAPS capsule  Class 3 severe obesity with serious comorbidity and body mass index (BMI) of 40.0 to 44.9 in adult, unspecified obesity type ([place])  PLAN:  Hypertension We discussed sodium restriction, working on healthy weight loss, and a regular exercise program as the means to achieve improved blood pressure control. [name] agreed with this plan and agreed to follow up as directed. We will continue to monitor her blood pressure as well as her progress with the above lifestyle modifications. [name] agrees to continue taking hydrochlorothiazide 25 mg qd #30 and we will refill for 1  month. She will watch for signs of hypotension as she continues her lifestyle modifications. [name] agrees to follow up with our clinic in 3 weeks.  Diabetes II with Peripheral Neuropathy [name] has been given extensive diabetes education by myself today including ideal fasting and post-prandial blood glucose readings, individual ideal Hgb A1c goals and hypoglycemia prevention. We discussed the importance of good blood sugar control to decrease the likelihood of diabetic complications such as nephropathy, neuropathy, limb loss, blindness, coronary artery disease, and death. We discussed the importance of intensive lifestyle modification including diet, exercise and weight loss as the first line treatment for diabetes. [name]  agrees to continue taking metformin 500 mg q AM #30 and we will refill for 1 month. [name] agrees to follow up with our clinic in 3 weeks.  Vitamin D Deficiency [name] was informed that low vitamin D levels contributes to fatigue and are associated with obesity, breast, and colon cancer. [name] agrees to continue taking prescription Vit D '@50' ,000 IU every week #4 and we will refill for 1 month. She will follow up for routine testing of vitamin D, at least 2-3 times per year. She was informed of the risk of over-replacement of vitamin D and agrees to not increase her dose unless she discusses this with [name] first. [name] agrees to follow up with our clinic in 3 weeks.  Obesity [name] is currently in the action stage of change. As such, her goal is to continue with weight loss efforts She has agreed to portion control better and make smarter food choices, such as increase vegetables and decrease simple carbohydrates  [name] has been instructed to work up to a goal of 150 minutes of combined cardio and strengthening exercise per week for weight loss and overall health benefits. We discussed the following Behavioral Modification Strategies today: work on meal planning and easy cooking plans and  holiday eating strategies    [name] has agreed to follow up with our clinic in 3 weeks. She was informed of the importance of frequent follow up visits to maximize her success with intensive lifestyle modifications for her multiple health conditions.   OBESITY BEHAVIORAL INTERVENTION VISIT  Today's visit was # 78   Starting weight: 280 lbs Starting date: 08/31/16 Today's weight : 260 lbs  Today's date: 06/20/2018 Total lbs lost to date: 20 At least 15 minutes were spent on discussing the following behavioral intervention visit.   ASK: We discussed the diagnosis of obesity with Luna Kitchens today and Martyna agreed to give [name] permission to discuss obesity behavioral modification therapy today.  ASSESS: Rosie has the diagnosis of obesity and her BMI today is 41.99 Mackenzy is in the action stage of change   ADVISE: Celeste was educated on the multiple health risks of obesity as well as the benefit of weight loss to improve her health. She was advised of the need for long term treatment and the importance of lifestyle modifications.  AGREE: Multiple dietary modification options and treatment options were discussed and  Denisa agreed to the above obesity treatment plan.  Wilhemena Durie, am acting as transcriptionist for [name], PA-C I, [name], PA-C have reviewed above note and agree with its content

## 2018-07-11 ENCOUNTER — Ambulatory Visit (INDEPENDENT_AMBULATORY_CARE_PROVIDER_SITE_OTHER): Payer: Medicare Other | Admitting: Physician Assistant

## 2018-07-11 ENCOUNTER — Encounter (INDEPENDENT_AMBULATORY_CARE_PROVIDER_SITE_OTHER): Payer: Self-pay | Admitting: Physician Assistant

## 2018-07-11 VITALS — BP 161/75 | HR 58 | Temp 98.2°F | Ht 66.0 in | Wt 264.0 lb

## 2018-07-11 DIAGNOSIS — E559 Vitamin D deficiency, unspecified: Secondary | ICD-10-CM

## 2018-07-11 DIAGNOSIS — E1140 Type 2 diabetes mellitus with diabetic neuropathy, unspecified: Secondary | ICD-10-CM | POA: Diagnosis not present

## 2018-07-11 DIAGNOSIS — Z6841 Body Mass Index (BMI) 40.0 and over, adult: Secondary | ICD-10-CM

## 2018-07-11 MED ORDER — METFORMIN HCL 500 MG PO TABS: ORAL_TABLET | ORAL | 0 refills | Status: DC

## 2018-07-11 MED ORDER — VITAMIN D (ERGOCALCIFEROL) 1.25 MG (50000 UNIT) PO CAPS: 50000.0000 [IU] | ORAL_CAPSULE | ORAL | 0 refills | Status: DC

## 2018-07-12 ENCOUNTER — Other Ambulatory Visit: Payer: Self-pay | Admitting: Family Medicine

## 2018-07-12 DIAGNOSIS — Z853 Personal history of malignant neoplasm of breast: Secondary | ICD-10-CM

## 2018-07-15 NOTE — Progress Notes (Signed)
Office: [phone]  /  Fax: [phone]   HPI:   Chief Complaint: OBESITY [name] is here to discuss her progress with her obesity treatment plan. She is on the portion control better and make smarter food choices plan and is following her eating plan approximately 60 % of the time. She states she is exercising 0 minutes 0 times per week. [name] reports that she is not eating lunch at times. She is also not getting enough protein in. She is ready to get back on track. Her weight is 264 lb (119.7 kg) today and has had a weight gain of 4 pounds over a period of 3 weeks since her last visit. She has lost 16 lbs since starting treatment with [name].  Diabetes II with peripheral neuropathy [name] has a diagnosis of diabetes type II. [name] denies any nausea, vomiting or diarrhea on metformin. Last A1c was at 6.1 She has been working on intensive lifestyle modifications including diet, exercise, and weight loss to help control her blood glucose levels. [name] denies polyphagia.  Vitamin D deficiency [name] has a diagnosis of vitamin D deficiency. She is currently taking vit D and denies nausea, vomiting or muscle weakness.  ALLERGIES: Allergies  Allergen Reactions  . Cymbalta [Duloxetine Hcl] Other (See Comments)    Stomach pain  . Gabapentin Itching  . Lipitor [Atorvastatin] Other (See Comments)    Cramps  . Lisinopril Swelling  . Maxzide [Hydrochlorothiazide W-Triamterene] Other (See Comments)    Cramping   . Pravastatin Sodium Other (See Comments)    Headache   . Simvastatin Other (See Comments)    Memory loss  . Lodine [Etodolac] Itching    MEDICATIONS: Current Outpatient Medications on File Prior to Visit  Medication Sig Dispense Refill  . amitriptyline (ELAVIL) 25 MG tablet Take 25 mg by mouth every morning.     [name] aspirin 81 MG tablet Take 81 mg by mouth daily.    [name] atorvastatin (LIPITOR) 20 MG tablet Take 20 mg by mouth once a week.    . blood glucose meter kit and supplies KIT  Dispense based on patient and insurance preference. Use up to four times daily as directed. (FOR ICD-9 250.00, 250.01). 1 each 0  . DUREZOL 0.05 % EMUL Place 1 drop into the left eye daily.  0  . escitalopram (LEXAPRO) 10 MG tablet Take 10 mg by mouth daily.    . ferrous sulfate 325 (65 FE) MG tablet Take 1 tablet (325 mg total) by mouth daily with breakfast. 30 tablet 0  . fluticasone (CUTIVATE) 0.05 % cream Apply 1 application topically 2 (two) times daily.  0  . hydrochlorothiazide (HYDRODIURIL) 25 MG tablet Take 1 tablet (25 mg total) by mouth daily. 30 tablet 0  . hydrOXYzine (ATARAX/VISTARIL) 10 MG tablet Take 10 mg by mouth 3 (three) times daily as needed.    . irbesartan (AVAPRO) 300 MG tablet Take 1 tablet (300 mg total) by mouth at bedtime. 30 tablet 0  . ketoconazole (NIZORAL) 2 % cream Apply 1 application topically daily. 15 g 0  . niacin 500 MG CR capsule Take 500 mg by mouth 2 (two) times daily with a meal.    . Omega-3 Fatty Acids (FISH OIL) 1200 MG CAPS Take 500 mg by mouth daily.     [name] tiZANidine (ZANAFLEX) 2 MG tablet Take 2 mg by mouth at bedtime.    . verapamil (VERELAN PM) 240 MG 24 hr capsule Take 240 mg by mouth daily.     [name]  Vitamin D, Cholecalciferol, 400 UNITS TABS Take 400 Units by mouth at bedtime.     No current facility-administered medications on file prior to visit.     PAST MEDICAL HISTORY: Past Medical History:  Diagnosis Date  . Anemia   . Anxiety    takes Xanax daily  . Arthritis   . Back pain   . Blood transfusion   . Breast cancer ([place]) 2013   left breast  . Bronchitis    hx of  . Chronic fatigue syndrome   . Depression    takes Lexapro daily  . Diabetes mellitus without complication ([place])    boderline  . Diverticulosis   . Edema    feet and legs  . Headache(784.0)   . History of colon polyps 1998   adenomatous  . Hyperlipidemia    takes Niacin daily  . Hypertension    takes Verapamil and Avapro daily  . Joint pain   . OSA  (obstructive sleep apnea)   . Personal history of radiation therapy   . Pneumonia 4/12   Albuterol daily as needed  . Radiation 10/29/11-11/24/11   left breast 6100 cGy  . Shortness of breath    with exertion  . Spinal stenosis   . Tinnitus     PAST SURGICAL HISTORY: Past Surgical History:  Procedure Laterality Date  . APPENDECTOMY  1978  . BACK SURGERY    . BELPHAROPTOSIS REPAIR    . BREAST EXCISIONAL BIOPSY Right 02/06/2013  . BREAST LUMPECTOMY  08/2011   left  . BREAST LUMPECTOMY WITH NEEDLE LOCALIZATION Right 02/06/2013   Procedure: RIGHT BREAST LUMPECTOMY WITH NEEDLE LOCALIZATION;  Surgeon: Harl Bowie, MD;  Location: [facility];  Service: General;  Laterality: Right;  . COLONOSCOPY W/ POLYPECTOMY    . COLONOSCOPY WITH PROPOFOL N/A 10/27/2015   Procedure: COLONOSCOPY WITH PROPOFOL;  Surgeon: Mauri Pole, MD;  Location: [facility];  Service: Endoscopy;  Laterality: N/A;  . HARDWARE REMOVAL Left 04/20/2015   Procedure: HARDWARE REMOVAL LEFT LUMBAR FIVE SCREW;  Surgeon: Karie Chimera, MD;  Location: [facility];  Service: Neurosurgery;  Laterality: Left;  . JOINT REPLACEMENT Bilateral    bilateral knee  . POLYPECTOMY  1990's  . THYROID CYST EXCISION  1994  . TOTAL KNEE ARTHROPLASTY  2006   bilateral  . TRANSPHENOIDAL / TRANSNASAL HYPOPHYSECTOMY / RESECTION PITUITARY TUMOR  6/11    SOCIAL HISTORY: Social History   Tobacco Use  . Smoking status: Never Smoker  . Smokeless tobacco: Never Used  Substance Use Topics  . Alcohol use: No  . Drug use: No    FAMILY HISTORY: Family History  Problem Relation Age of Onset  . Heart disease Father   . Clotting disorder Father   . Stroke Father   . Hypertension Father   . Heart Problems Father   . Stroke Mother   . Diabetes Mother   . Hypertension Mother   . Hyperlipidemia Mother   . Obesity Mother   . Cancer Sister 46       Breast Cancer  . Breast cancer Sister 71  . Colon cancer Neg Hx   . Neuropathy Neg Hx      ROS: Review of Systems  Constitutional: Negative for weight loss.  Gastrointestinal: Negative for diarrhea, nausea and vomiting.  Musculoskeletal:       Negative for muscle weakness  Endo/Heme/Allergies:       Negative for polyphagia    PHYSICAL EXAM: Blood pressure (!) 161/75, pulse (!) 58, temperature  98.2 F (36.8 C), temperature source Oral, height '5\' 6"'  (1.676 m), weight 264 lb (119.7 kg), SpO2 98 %. Body mass index is 42.61 kg/m. Physical Exam Vitals signs reviewed.  Constitutional:      Appearance: Normal appearance. She is well-developed. She is obese.  Cardiovascular:     Rate and Rhythm: Normal rate.  Pulmonary:     Effort: Pulmonary effort is normal.  Musculoskeletal: Normal range of motion.  Skin:    General: Skin is warm and dry.  Neurological:     Mental Status: She is alert and oriented to person, place, and time.  Psychiatric:        Mood and Affect: Mood normal.        Behavior: Behavior normal.     RECENT LABS AND TESTS: BMET    Component Value Date/Time   NA 144 03/28/2018 0941   NA 140 06/01/2016 1143   K 4.4 03/28/2018 0941   K 4.0 06/01/2016 1143   CL 104 03/28/2018 0941   CL 105 12/09/2012 1423   CO2 24 03/28/2018 0941   CO2 26 06/01/2016 1143   GLUCOSE 97 03/28/2018 0941   GLUCOSE 126 08/30/2017 0736   GLUCOSE 121 06/01/2016 1143   GLUCOSE 177 (H) 12/09/2012 1423   BUN 19 03/28/2018 0941   BUN 12.5 06/01/2016 1143   CREATININE 1.19 (H) 03/28/2018 0941   CREATININE 1.17 (H) 08/30/2017 0736   CREATININE 1.1 06/01/2016 1143   CALCIUM 10.2 03/28/2018 0941   CALCIUM 10.1 06/01/2016 1143   GFRNONAA 45 (L) 03/28/2018 0941   GFRNONAA 45 (L) 08/30/2017 0736   GFRAA 52 (L) 03/28/2018 0941   GFRAA 52 (L) 08/30/2017 0736   Lab Results  Component Value Date   HGBA1C 6.1 (H) 03/28/2018   HGBA1C 6.8 (H) 05/10/2017   HGBA1C 6.2 (H) 12/14/2016   HGBA1C 6.3 (H) 08/31/2016   HGBA1C (H) 11/11/2010    6.7 (NOTE)                                                                        According to the ADA Clinical Practice Recommendations for 2011, when HbA1c is used as a screening test:   >=6.5%   Diagnostic of Diabetes Mellitus           (if abnormal result  is confirmed)  5.7-6.4%   Increased risk of developing Diabetes Mellitus  References:Diagnosis and Classification of Diabetes Mellitus,Diabetes MBEM,7544,92(EFEOF 1):S62-S69 and Standards of Medical Care in         Diabetes - 2011,Diabetes Care,2011,34  (Suppl 1):S11-S61.   Lab Results  Component Value Date   INSULIN 20.7 03/28/2018   INSULIN 10.5 05/10/2017   INSULIN 20.9 12/14/2016   INSULIN 23.5 08/31/2016   CBC    Component Value Date/Time   WBC 7.1 08/30/2017 0736   WBC 14.5 (H) 08/21/2016 2027   RBC 4.05 08/30/2017 0736   HGB 10.7 (L) 08/30/2017 0736   HGB 11.0 (L) 05/10/2017 1006   HGB 11.1 (L) 06/01/2016 1143   HCT 32.7 (L) 08/30/2017 0736   HCT 34.4 05/10/2017 1006   HCT 34.9 06/01/2016 1143   PLT 243 08/30/2017 0736   PLT 248 06/01/2016 1143   MCV 80.7 08/30/2017 0736   MCV  80 05/10/2017 1006   MCV 81.2 06/01/2016 1143   MCH 26.3 08/30/2017 0736   MCHC 32.6 08/30/2017 0736   RDW 15.8 (H) 08/30/2017 0736   RDW 16.5 (H) 05/10/2017 1006   RDW 15.8 (H) 06/01/2016 1143   LYMPHSABS 1.8 08/30/2017 0736   LYMPHSABS 1.3 05/10/2017 1006   LYMPHSABS 1.8 06/01/2016 1143   MONOABS 0.5 08/30/2017 0736   MONOABS 0.6 06/01/2016 1143   EOSABS 0.1 08/30/2017 0736   EOSABS 0.1 05/10/2017 1006   BASOSABS 0.0 08/30/2017 0736   BASOSABS 0.0 05/10/2017 1006   BASOSABS 0.0 06/01/2016 1143   Iron/TIBC/Ferritin/ %Sat    Component Value Date/Time   IRON 55 12/14/2016 1146   TIBC 329 12/14/2016 1146   FERRITIN 479 (H) 12/14/2016 1146   FERRITIN 362 (H) 04/15/2015 1058   IRONPCTSAT 17 12/14/2016 1146   Lipid Panel     Component Value Date/Time   CHOL 230 (H) 03/28/2018 0941   TRIG 125 03/28/2018 0941   HDL 41 03/28/2018 0941   CHOLHDL 6.4 11/11/2010 0555    VLDL 17 11/11/2010 0555   LDLCALC 164 (H) 03/28/2018 0941   Hepatic Function Panel     Component Value Date/Time   PROT 7.1 03/28/2018 0941   PROT 7.6 06/01/2016 1143   ALBUMIN 4.0 03/28/2018 0941   ALBUMIN 3.2 (L) 06/01/2016 1143   AST 12 03/28/2018 0941   AST 10 08/30/2017 0736   AST 11 06/01/2016 1143   ALT 14 03/28/2018 0941   ALT 9 08/30/2017 0736   ALT 12 06/01/2016 1143   ALKPHOS 71 03/28/2018 0941   ALKPHOS 69 06/01/2016 1143   BILITOT 0.4 03/28/2018 0941   BILITOT 0.4 08/30/2017 0736   BILITOT 0.28 06/01/2016 1143     Ref. Range 03/28/2018 09:41  Vitamin D, 25-Hydroxy Latest Ref Range: 30.0 - 100.0 ng/mL 41.9   ASSESSMENT AND PLAN: Vitamin D deficiency - Plan: Vitamin D, Ergocalciferol, (DRISDOL) 1.25 MG (50000 UT) CAPS capsule  Type 2 diabetes mellitus with diabetic neuropathy, without long-term current use of insulin (HCC) - Plan: metFORMIN (GLUCOPHAGE) 500 MG tablet  Class 3 severe obesity with serious comorbidity and body mass index (BMI) of 40.0 to 44.9 in adult, unspecified obesity type ([place])  PLAN:  Diabetes II with peripheral neuropathy Marlon has been given extensive diabetes education by myself today including ideal fasting and post-prandial blood glucose readings, individual ideal Hgb A1c goals and hypoglycemia prevention. We discussed the importance of good blood sugar control to decrease the likelihood of diabetic complications such as nephropathy, neuropathy, limb loss, blindness, coronary artery disease, and death. We discussed the importance of intensive lifestyle modification including diet, exercise and weight loss as the first line treatment for diabetes. Tashauna agrees to continue metformin 500 mg by mouth once daily with breakfast #30 with no refills and continue to work on weight loss. Jazae agreed to follow up at the agreed upon time.  Vitamin D Deficiency Jolyssa was informed that low vitamin D levels contributes to fatigue and are associated with  obesity, breast, and colon cancer. She agrees to continue to take prescription Vit D '@50' ,000 IU every week #4 with no refills and will follow up for routine testing of vitamin D, at least 2-3 times per year. She was informed of the risk of over-replacement of vitamin D and agrees to not increase her dose unless she discusses this with [name] first. Calyssa agrees to follow up with our clinic in 3 to 4 weeks.  Obesity Neysha is currently  in the action stage of change. As such, her goal is to continue with weight loss efforts She has agreed to portion control better and make smarter food choices, such as increase vegetables and decrease simple carbohydrates  Nida has been instructed to work up to a goal of 150 minutes of combined cardio and strengthening exercise per week for weight loss and overall health benefits. We discussed the following Behavioral Modification Strategies today: work on meal planning and easy cooking plans and holiday eating strategies   Riki has agreed to follow up with our clinic in 3 to 4 weeks. She was informed of the importance of frequent follow up visits to maximize her success with intensive lifestyle modifications for her multiple health conditions.   OBESITY BEHAVIORAL INTERVENTION VISIT  Today's visit was # 23  Starting weight: 280 lbs Starting date: 08/31/2016 Today's weight : 264 lbs  Today's date: 07/11/2018 Total lbs lost to date: 16 At least 15 minutes were spent on discussing the following behavioral intervention visit.   ASK: We discussed the diagnosis of obesity with Luna Kitchens today and Clarine agreed to give [name] permission to discuss obesity behavioral modification therapy today.  ASSESS: Rehanna has the diagnosis of obesity and her BMI today is 42.63 Jaquel is in the action stage of change   ADVISE: Alegandra was educated on the multiple health risks of obesity as well as the benefit of weight loss to improve her health. She was advised of the need for  long term treatment and the importance of lifestyle modifications to improve her current health and to decrease her risk of future health problems.  AGREE: Multiple dietary modification options and treatment options were discussed and  Ingra agreed to follow the recommendations documented in the above note.  ARRANGE: Gilbert was educated on the importance of frequent visits to treat obesity as outlined per CMS and USPSTF guidelines and agreed to schedule her next follow up appointment today.  Corey Skains, am acting as transcriptionist for [name], PA-C I, [name], PA-C have reviewed above note and agree with its content

## 2018-08-07 ENCOUNTER — Other Ambulatory Visit (INDEPENDENT_AMBULATORY_CARE_PROVIDER_SITE_OTHER): Payer: Self-pay | Admitting: Physician Assistant

## 2018-08-07 DIAGNOSIS — I10 Essential (primary) hypertension: Secondary | ICD-10-CM

## 2018-08-08 ENCOUNTER — Ambulatory Visit (INDEPENDENT_AMBULATORY_CARE_PROVIDER_SITE_OTHER): Payer: Medicare Other | Admitting: Physician Assistant

## 2018-08-15 ENCOUNTER — Ambulatory Visit
Admission: RE | Admit: 2018-08-15 | Discharge: 2018-08-15 | Disposition: A | Discharge status: Home / Self Care | Payer: Medicare Other | Source: Ambulatory Visit | Attending: Family Medicine | Admitting: Family Medicine

## 2018-08-15 ENCOUNTER — Other Ambulatory Visit: Payer: Self-pay | Admitting: Family Medicine

## 2018-08-15 DIAGNOSIS — Z853 Personal history of malignant neoplasm of breast: Secondary | ICD-10-CM

## 2018-08-19 ENCOUNTER — Ambulatory Visit (INDEPENDENT_AMBULATORY_CARE_PROVIDER_SITE_OTHER): Payer: Medicare Other | Admitting: Physician Assistant

## 2018-08-19 ENCOUNTER — Encounter (INDEPENDENT_AMBULATORY_CARE_PROVIDER_SITE_OTHER): Payer: Self-pay | Admitting: Physician Assistant

## 2018-08-19 VITALS — BP 148/76 | HR 77 | Temp 98.1°F | Ht 66.0 in | Wt 266.0 lb

## 2018-08-19 DIAGNOSIS — I10 Essential (primary) hypertension: Secondary | ICD-10-CM | POA: Diagnosis not present

## 2018-08-19 DIAGNOSIS — Z6841 Body Mass Index (BMI) 40.0 and over, adult: Secondary | ICD-10-CM

## 2018-08-19 DIAGNOSIS — E559 Vitamin D deficiency, unspecified: Secondary | ICD-10-CM

## 2018-08-19 MED ORDER — VITAMIN D (ERGOCALCIFEROL) 1.25 MG (50000 UNIT) PO CAPS: 50000.0000 [IU] | ORAL_CAPSULE | ORAL | 0 refills | Status: DC

## 2018-08-19 NOTE — Progress Notes (Signed)
Office: [phone]  /  Fax: [phone]   HPI:   Chief Complaint: OBESITY [name] is here to discuss her progress with her obesity treatment plan. She is on the portion control better and make smarter food choices, such as increase vegetables and decrease simple carbohydrates  and is following her eating plan approximately 0 % of the time. She states she is exercising 0 minutes 0 times per week. [name] reports that she struggled with holiday eating. She is ready to get back on track. Her weight is 266 lb (120.7 kg) today and has gained 2 lbs since her last visit. She has lost 14 lbs since starting treatment with [name].  Vitamin D deficiency [name] has a diagnosis of vitamin D deficiency. She is currently taking prescription Vit D and denies nausea, vomiting or muscle weakness.  Hypertension [name] is a 73 y.o. female with hypertension.  [name] denies chest pain.  She is working weight loss to help control her blood pressure with the goal of decreasing her risk of heart attack and stroke. She is taking verapamil and irbesartan. [name]'s blood pressure is slightly elevated.  ASSESSMENT AND PLAN:  Vitamin D deficiency - Plan: Vitamin D, Ergocalciferol, (DRISDOL) 1.25 MG (50000 UT) CAPS capsule  Essential hypertension  Class 3 severe obesity with serious comorbidity and body mass index (BMI) of 40.0 to 44.9 in adult, unspecified obesity type ([name])  PLAN:  Vitamin D Deficiency [name] was informed that low vitamin D levels contributes to fatigue and are associated with obesity, breast, and colon cancer. She agrees to continue to take prescription Vit D 50,000 IU every week #4 with no refills and will follow up for routine testing of vitamin D, at least 2-3 times per year. She was informed of the risk of over-replacement of vitamin D and agrees to not increase her dose unless she discusses this with [name] first. [name] agrees to follow up with our clinic in 3  weeks.  Hypertension We discussed sodium restriction, working on healthy weight loss, and a regular exercise program as the means to achieve improved blood pressure control. [name] agreed with this plan and agreed to follow up as directed. We will continue to monitor her blood pressure as well as her progress with the above lifestyle modifications. She will continue her medications and will watch for signs of hypotension as she continues her lifestyle modifications. [name] agrees to follow up with our clinic in 3 weeks.  Obesity [name] is currently in the action stage of change. As such, her goal is to continue with weight loss efforts She has agreed to change to the Category 2 plan [name] has been instructed to work up to a goal of 150 minutes of combined cardio and strengthening exercise per week for weight loss and overall health benefits. We discussed the following Behavioral Modification Strategies today: increasing lean protein intake and work on meal planning and easy cooking plans  [name] has agreed to follow up with our clinic in 3 weeks. She was informed of the importance of frequent follow up visits to maximize her success with intensive lifestyle modifications for her multiple health conditions.  ALLERGIES: Allergies  Allergen Reactions  . Cymbalta [Duloxetine Hcl] Other (See Comments)    Stomach pain  . Gabapentin Itching  . Lipitor [Atorvastatin] Other (See Comments)    Cramps  . Lisinopril Swelling  . Maxzide [Hydrochlorothiazide W-Triamterene] Other (See Comments)    Cramping   . Pravastatin Sodium Other (See Comments)    Headache   .  Simvastatin Other (See Comments)    Memory loss  . Lodine [Etodolac] Itching    MEDICATIONS: Current Outpatient Medications on File Prior to Visit  Medication Sig Dispense Refill  . amitriptyline (ELAVIL) 25 MG tablet Take 25 mg by mouth every morning.     [name] aspirin 81 MG tablet Take 81 mg by mouth daily.    [name] atorvastatin (LIPITOR) 20  MG tablet Take 20 mg by mouth once a week.    . blood glucose meter kit and supplies KIT Dispense based on patient and insurance preference. Use up to four times daily as directed. (FOR ICD-9 250.00, 250.01). 1 each 0  . DUREZOL 0.05 % EMUL Place 1 drop into the left eye daily.  0  . escitalopram (LEXAPRO) 10 MG tablet Take 10 mg by mouth daily.    . ferrous sulfate 325 (65 FE) MG tablet Take 1 tablet (325 mg total) by mouth daily with breakfast. 30 tablet 0  . fluticasone (CUTIVATE) 0.05 % cream Apply 1 application topically 2 (two) times daily.  0  . hydrochlorothiazide (HYDRODIURIL) 25 MG tablet TAKE 1 TABLET BY MOUTH ONCE DAILY 30 tablet 0  . hydrOXYzine (ATARAX/VISTARIL) 10 MG tablet Take 10 mg by mouth 3 (three) times daily as needed.    . irbesartan (AVAPRO) 300 MG tablet Take 1 tablet (300 mg total) by mouth at bedtime. 30 tablet 0  . ketoconazole (NIZORAL) 2 % cream Apply 1 application topically daily. 15 g 0  . metFORMIN (GLUCOPHAGE) 500 MG tablet take 1 tablet by mouth once daily with BREAKFAST 30 tablet 0  . niacin 500 MG CR capsule Take 500 mg by mouth 2 (two) times daily with a meal.    . Omega-3 Fatty Acids (FISH OIL) 1200 MG CAPS Take 500 mg by mouth daily.     [name] tiZANidine (ZANAFLEX) 2 MG tablet Take 2 mg by mouth at bedtime.    . verapamil (VERELAN PM) 240 MG 24 hr capsule Take 240 mg by mouth daily.     . Vitamin D, Cholecalciferol, 400 UNITS TABS Take 400 Units by mouth at bedtime.     No current facility-administered medications on file prior to visit.     PAST MEDICAL HISTORY: Past Medical History:  Diagnosis Date  . Anemia   . Anxiety    takes Xanax daily  . Arthritis   . Back pain   . Blood transfusion   . Breast cancer ([place]) 2013   left breast  . Bronchitis    hx of  . Chronic fatigue syndrome   . Depression    takes Lexapro daily  . Diabetes mellitus without complication ([place])    boderline  . Diverticulosis   . Edema    feet and legs  .  Headache(784.0)   . History of colon polyps 1998   adenomatous  . Hyperlipidemia    takes Niacin daily  . Hypertension    takes Verapamil and Avapro daily  . Joint pain   . OSA (obstructive sleep apnea)   . Personal history of radiation therapy   . Pneumonia 4/12   Albuterol daily as needed  . Radiation 10/29/11-11/24/11   left breast 6100 cGy  . Shortness of breath    with exertion  . Spinal stenosis   . Tinnitus     PAST SURGICAL HISTORY: Past Surgical History:  Procedure Laterality Date  . APPENDECTOMY  1978  . BACK SURGERY    . BELPHAROPTOSIS REPAIR    . BREAST  EXCISIONAL BIOPSY Right 02/06/2013  . BREAST LUMPECTOMY  08/2011   left  . BREAST LUMPECTOMY WITH NEEDLE LOCALIZATION Right 02/06/2013   Procedure: RIGHT BREAST LUMPECTOMY WITH NEEDLE LOCALIZATION;  Surgeon: Harl Bowie, MD;  Location: [facility];  Service: General;  Laterality: Right;  . COLONOSCOPY W/ POLYPECTOMY    . COLONOSCOPY WITH PROPOFOL N/A 10/27/2015   Procedure: COLONOSCOPY WITH PROPOFOL;  Surgeon: Mauri Pole, MD;  Location: [facility];  Service: Endoscopy;  Laterality: N/A;  . HARDWARE REMOVAL Left 04/20/2015   Procedure: HARDWARE REMOVAL LEFT LUMBAR FIVE SCREW;  Surgeon: Karie Chimera, MD;  Location: [facility];  Service: Neurosurgery;  Laterality: Left;  . JOINT REPLACEMENT Bilateral    bilateral knee  . POLYPECTOMY  1990's  . THYROID CYST EXCISION  1994  . TOTAL KNEE ARTHROPLASTY  2006   bilateral  . TRANSPHENOIDAL / TRANSNASAL HYPOPHYSECTOMY / RESECTION PITUITARY TUMOR  6/11    SOCIAL HISTORY: Social History   Tobacco Use  . Smoking status: Never Smoker  . Smokeless tobacco: Never Used  Substance Use Topics  . Alcohol use: No  . Drug use: No    FAMILY HISTORY: Family History  Problem Relation Age of Onset  . Heart disease Father   . Clotting disorder Father   . Stroke Father   . Hypertension Father   . Heart Problems Father   . Stroke Mother   . Diabetes Mother   .  Hypertension Mother   . Hyperlipidemia Mother   . Obesity Mother   . Cancer Sister 37       Breast Cancer  . Breast cancer Sister 36  . Colon cancer Neg Hx   . Neuropathy Neg Hx     ROS: Review of Systems  Constitutional: Negative for weight loss.  Cardiovascular: Negative for chest pain.  Gastrointestinal: Negative for nausea and vomiting.  Musculoskeletal:       Negative for muscle weakness    PHYSICAL EXAM: Blood pressure (!) 148/76, pulse 77, temperature 98.1 F (36.7 C), temperature source Oral, height '5\' 6"'  (1.676 m), weight 266 lb (120.7 kg), SpO2 97 %. Body mass index is 42.93 kg/m. Physical Exam Vitals signs reviewed.  Constitutional:      Appearance: Normal appearance. She is obese.  Cardiovascular:     Rate and Rhythm: Normal rate.     Pulses: Normal pulses.  Pulmonary:     Effort: Pulmonary effort is normal.  Musculoskeletal: Normal range of motion.  Skin:    General: Skin is warm and dry.  Neurological:     Mental Status: She is alert and oriented to person, place, and time.  Psychiatric:        Mood and Affect: Mood normal.        Behavior: Behavior normal.     RECENT LABS AND TESTS: BMET    Component Value Date/Time   NA 144 03/28/2018 0941   NA 140 06/01/2016 1143   K 4.4 03/28/2018 0941   K 4.0 06/01/2016 1143   CL 104 03/28/2018 0941   CL 105 12/09/2012 1423   CO2 24 03/28/2018 0941   CO2 26 06/01/2016 1143   GLUCOSE 97 03/28/2018 0941   GLUCOSE 126 08/30/2017 0736   GLUCOSE 121 06/01/2016 1143   GLUCOSE 177 (H) 12/09/2012 1423   BUN 19 03/28/2018 0941   BUN 12.5 06/01/2016 1143   CREATININE 1.19 (H) 03/28/2018 0941   CREATININE 1.17 (H) 08/30/2017 0736   CREATININE 1.1 06/01/2016 1143   CALCIUM 10.2 03/28/2018  0941   CALCIUM 10.1 06/01/2016 1143   GFRNONAA 45 (L) 03/28/2018 0941   GFRNONAA 45 (L) 08/30/2017 0736   GFRAA 52 (L) 03/28/2018 0941   GFRAA 52 (L) 08/30/2017 0736   Lab Results  Component Value Date   HGBA1C 6.1 (H)  03/28/2018   HGBA1C 6.8 (H) 05/10/2017   HGBA1C 6.2 (H) 12/14/2016   HGBA1C 6.3 (H) 08/31/2016   HGBA1C (H) 11/11/2010    6.7 (NOTE)                                                                       According to the ADA Clinical Practice Recommendations for 2011, when HbA1c is used as a screening test:   >=6.5%   Diagnostic of Diabetes Mellitus           (if abnormal result  is confirmed)  5.7-6.4%   Increased risk of developing Diabetes Mellitus  References:Diagnosis and Classification of Diabetes Mellitus,Diabetes XJOI,3254,98(YMEBR 1):S62-S69 and Standards of Medical Care in         Diabetes - 2011,Diabetes Care,2011,34  (Suppl 1):S11-S61.   Lab Results  Component Value Date   INSULIN 20.7 03/28/2018   INSULIN 10.5 05/10/2017   INSULIN 20.9 12/14/2016   INSULIN 23.5 08/31/2016   CBC    Component Value Date/Time   WBC 7.1 08/30/2017 0736   WBC 14.5 (H) 08/21/2016 2027   RBC 4.05 08/30/2017 0736   HGB 10.7 (L) 08/30/2017 0736   HGB 11.0 (L) 05/10/2017 1006   HGB 11.1 (L) 06/01/2016 1143   HCT 32.7 (L) 08/30/2017 0736   HCT 34.4 05/10/2017 1006   HCT 34.9 06/01/2016 1143   PLT 243 08/30/2017 0736   PLT 248 06/01/2016 1143   MCV 80.7 08/30/2017 0736   MCV 80 05/10/2017 1006   MCV 81.2 06/01/2016 1143   MCH 26.3 08/30/2017 0736   MCHC 32.6 08/30/2017 0736   RDW 15.8 (H) 08/30/2017 0736   RDW 16.5 (H) 05/10/2017 1006   RDW 15.8 (H) 06/01/2016 1143   LYMPHSABS 1.8 08/30/2017 0736   LYMPHSABS 1.3 05/10/2017 1006   LYMPHSABS 1.8 06/01/2016 1143   MONOABS 0.5 08/30/2017 0736   MONOABS 0.6 06/01/2016 1143   EOSABS 0.1 08/30/2017 0736   EOSABS 0.1 05/10/2017 1006   BASOSABS 0.0 08/30/2017 0736   BASOSABS 0.0 05/10/2017 1006   BASOSABS 0.0 06/01/2016 1143   Iron/TIBC/Ferritin/ %Sat    Component Value Date/Time   IRON 55 12/14/2016 1146   TIBC 329 12/14/2016 1146   FERRITIN 479 (H) 12/14/2016 1146   FERRITIN 362 (H) 04/15/2015 1058   IRONPCTSAT 17 12/14/2016 1146    Lipid Panel     Component Value Date/Time   CHOL 230 (H) 03/28/2018 0941   TRIG 125 03/28/2018 0941   HDL 41 03/28/2018 0941   CHOLHDL 6.4 11/11/2010 0555   VLDL 17 11/11/2010 0555   LDLCALC 164 (H) 03/28/2018 0941   Hepatic Function Panel     Component Value Date/Time   PROT 7.1 03/28/2018 0941   PROT 7.6 06/01/2016 1143   ALBUMIN 4.0 03/28/2018 0941   ALBUMIN 3.2 (L) 06/01/2016 1143   AST 12 03/28/2018 0941   AST 10 08/30/2017 0736   AST 11 06/01/2016 1143   ALT 14  03/28/2018 0941   ALT 9 08/30/2017 0736   ALT 12 06/01/2016 1143   ALKPHOS 71 03/28/2018 0941   ALKPHOS 69 06/01/2016 1143   BILITOT 0.4 03/28/2018 0941   BILITOT 0.4 08/30/2017 0736   BILITOT 0.28 06/01/2016 1143      Ref. Range 03/28/2018 09:41  Vitamin D, 25-Hydroxy Latest Ref Range: 30.0 - 100.0 ng/mL 41.9     OBESITY BEHAVIORAL INTERVENTION VISIT  Today's visit was # 40  Starting weight: 280 lbs Starting date: 08/31/2016 Today's weight :: 266 lbs Today's date: 08/19/2018 Total lbs lost to date: 14 At least 15 minutes were spent on discussing the following behavioral intervention visit.   ASK: We discussed the diagnosis of obesity with [name] today and [name] agreed to give [name] permission to discuss obesity behavioral modification therapy today.  ASSESS: [name] has the diagnosis of obesity and her BMI today is 42.95 [name] is in the action stage of change   ADVISE: [name] was educated on the multiple health risks of obesity as well as the benefit of weight loss to improve her health. She was advised of the need for long term treatment and the importance of lifestyle modifications to improve her current health and to decrease her risk of future health problems.  AGREE: Multiple dietary modification options and treatment options were discussed and  [name] agreed to follow the recommendations documented in the above note.  ARRANGE: [name] was educated on the importance of frequent visits  to treat obesity as outlined per CMS and USPSTF guidelines and agreed to schedule her next follow up appointment today.  I, Tammy wysor, am acting as transcriptionist for [name], PA-C I, [name], PA-C have reviewed above note and agree with its content

## 2018-09-12 ENCOUNTER — Encounter (INDEPENDENT_AMBULATORY_CARE_PROVIDER_SITE_OTHER): Payer: Self-pay | Admitting: Physician Assistant

## 2018-09-12 ENCOUNTER — Ambulatory Visit (INDEPENDENT_AMBULATORY_CARE_PROVIDER_SITE_OTHER): Payer: Medicare Other | Admitting: Physician Assistant

## 2018-09-12 VITALS — BP 115/64 | HR 74 | Ht 66.0 in | Wt 271.0 lb

## 2018-09-12 DIAGNOSIS — E559 Vitamin D deficiency, unspecified: Secondary | ICD-10-CM | POA: Diagnosis not present

## 2018-09-12 DIAGNOSIS — E119 Type 2 diabetes mellitus without complications: Secondary | ICD-10-CM

## 2018-09-12 DIAGNOSIS — E7849 Other hyperlipidemia: Secondary | ICD-10-CM | POA: Diagnosis not present

## 2018-09-12 DIAGNOSIS — I10 Essential (primary) hypertension: Secondary | ICD-10-CM | POA: Diagnosis not present

## 2018-09-12 DIAGNOSIS — Z6841 Body Mass Index (BMI) 40.0 and over, adult: Secondary | ICD-10-CM | POA: Diagnosis not present

## 2018-09-12 MED ORDER — VITAMIN D (ERGOCALCIFEROL) 1.25 MG (50000 UNIT) PO CAPS: 50000.0000 [IU] | ORAL_CAPSULE | ORAL | 0 refills | Status: DC

## 2018-09-12 MED ORDER — HYDROCHLOROTHIAZIDE 25 MG PO TABS: 25.0000 mg | ORAL_TABLET | Freq: Every day | ORAL | 0 refills | Status: DC

## 2018-09-12 MED ORDER — METFORMIN HCL 500 MG PO TABS: ORAL_TABLET | ORAL | 0 refills | Status: DC

## 2018-09-12 NOTE — Progress Notes (Signed)
Office: [phone]  /  Fax: [phone]   HPI:   Chief Complaint: OBESITY [name] is here to discuss her progress with her obesity treatment plan. She is on the Category 2 plan and is following her eating plan approximately 80 % of the time. She states she is exercising 0 minutes 0 times per week. [name] reports that she has been eating a lot of nuts recently. She has been under eating her protein.  Her weight is 271 lb (122.9 kg) today and has gained 5 lbs since her last visit. She has lost 9 lbs since starting treatment with [name].  Hypertension [name] is a 73 y.o. female with hypertension.  [name] denies chest pain. She is working weight loss to help control her blood pressure with the goal of decreasing her risk of heart attack and stroke. [name] blood pressure is normal.  Vitamin D deficiency [name] has a diagnosis of vitamin D deficiency. She is currently taking Vit D and denies nausea, vomiting or muscle weakness.  Hyperlipidemia [name] has hyperlipidemia and has been trying to improve her cholesterol levels with intensive lifestyle modification including a low saturated fat diet, exercise and weight loss. She is on Atorvastatin. She denies any chest pain.  Diabetes II [name] has a diagnosis of diabetes type II. [name] denies any hypoglycemic or polyphagia episodes. Last A1c was Hemoglobin A1C Latest Ref Rng & Units 03/28/2018  HGBA1C 4.8 - 5.6 % 6.1(H)  Some recent data might be hidden   [name] is on metformin and denies any nausea, vomiting or diarrhea.  She has been working on intensive lifestyle modifications including diet, exercise, and weight loss to help control her blood glucose levels.  ASSESSMENT AND PLAN:  Essential hypertension - Plan: hydrochlorothiazide (HYDRODIURIL) 25 MG tablet  Vitamin D deficiency - Plan: VITAMIN D 25 Hydroxy (Vit-D Deficiency, Fractures), Vitamin D, Ergocalciferol, (DRISDOL) 1.25 MG (50000 UT) CAPS capsule  Other  hyperlipidemia - Plan: Lipid Panel With LDL/HDL Ratio  Type 2 diabetes mellitus without complication, without long-term current use of insulin (HCC) - Plan: Comprehensive metabolic panel, Hemoglobin A1c, Insulin, random, metFORMIN (GLUCOPHAGE) 500 MG tablet  Class 3 severe obesity with serious comorbidity and body mass index (BMI) of 40.0 to 44.9 in adult, unspecified obesity type ([place])  PLAN:  Hypertension We discussed sodium restriction, working on healthy weight loss, and a regular exercise program as the means to achieve improved blood pressure control. [name] agreed with this plan and agreed to follow up as directed. We will continue to monitor her blood pressure as well as her progress with the above lifestyle modifications. She will continue taking HCTZ 25 mg qd #30 with no refills and will watch for signs of hypotension as she continues her lifestyle modifications. [name] agrees to follow up with our clinic in 3 weeks.  Vitamin D Deficiency [name] was informed that low vitamin D levels contributes to fatigue and are associated with obesity, breast, and colon cancer. She agrees to continue to take prescription Vit D 50,000 IU every week #4 with no refills and will follow up for routine testing of vitamin D, at least 2-3 times per year. She was informed of the risk of over-replacement of vitamin D and agrees to not increase her dose unless she discusses this with [name] first. We will check labs today. [name] agrees to follow up with our clinic in 3 weeks.  Hyperlipidemia [name] was informed of the American Heart Association Guidelines emphasizing intensive lifestyle modifications as the first line treatment  for hyperlipidemia. We discussed many lifestyle modifications today in depth, and [name] will continue to work on decreasing saturated fats such as fatty red meat, butter and many fried foods. She will also increase vegetables and lean protein in her diet and continue to work on exercise and weight  loss efforts. [name] will continue with medication and weight loss. We will check labs today. [name] agrees to follow up with our clinic in 3 weeks.  Diabetes II [name] has been given extensive diabetes education by myself today including ideal fasting and post-prandial blood glucose readings, individual ideal Hgb A1c goals  and hypoglycemia prevention. We discussed the importance of good blood sugar control to decrease the likelihood of diabetic complications such as nephropathy, neuropathy, limb loss, blindness, coronary artery disease, and death. We discussed the importance of intensive lifestyle modification including diet, exercise and weight loss as the first line treatment for diabetes. We will check labs today. [name] agrees to continue taking metformin 500 mg qd with no refills and will follow up with our clinic in 3 weeks.  Obesity [name] is currently in the action stage of change. As such, her goal is to continue with weight loss efforts She has agreed to follow the Category 2 plan [name] has been instructed to work up to a goal of 150 minutes of combined cardio and strengthening exercise per week for weight loss and overall health benefits. We discussed the following Behavioral Modification Strategies today: increasing lean protein intake and work on meal planning and easy cooking plans  [name] has agreed to follow up with our clinic in 3 weeks. She was informed of the importance of frequent follow up visits to maximize her success with intensive lifestyle modifications for her multiple health conditions.  ALLERGIES: Allergies  Allergen Reactions  . Cymbalta [Duloxetine Hcl] Other (See Comments)    Stomach pain  . Gabapentin Itching  . Lipitor [Atorvastatin] Other (See Comments)    Cramps  . Lisinopril Swelling  . Maxzide [Hydrochlorothiazide W-Triamterene] Other (See Comments)    Cramping   . Pravastatin Sodium Other (See Comments)    Headache   . Simvastatin Other (See Comments)      Memory loss  . Lodine [Etodolac] Itching    MEDICATIONS: Current Outpatient Medications on File Prior to Visit  Medication Sig Dispense Refill  . amitriptyline (ELAVIL) 25 MG tablet Take 25 mg by mouth every morning.     [name] aspirin 81 MG tablet Take 81 mg by mouth daily.    [name] atorvastatin (LIPITOR) 20 MG tablet Take 20 mg by mouth once a week.    . blood glucose meter kit and supplies KIT Dispense based on patient and insurance preference. Use up to four times daily as directed. (FOR ICD-9 250.00, 250.01). 1 each 0  . DUREZOL 0.05 % EMUL Place 1 drop into the left eye daily.  0  . escitalopram (LEXAPRO) 10 MG tablet Take 10 mg by mouth daily.    . ferrous sulfate 325 (65 FE) MG tablet Take 1 tablet (325 mg total) by mouth daily with breakfast. 30 tablet 0  . fluticasone (CUTIVATE) 0.05 % cream Apply 1 application topically 2 (two) times daily.  0  . hydrOXYzine (ATARAX/VISTARIL) 10 MG tablet Take 10 mg by mouth 3 (three) times daily as needed.    . irbesartan (AVAPRO) 300 MG tablet Take 1 tablet (300 mg total) by mouth at bedtime. 30 tablet 0  . ketoconazole (NIZORAL) 2 % cream Apply 1 application topically daily. 15  g 0  . niacin 500 MG CR capsule Take 500 mg by mouth 2 (two) times daily with a meal.    . Omega-3 Fatty Acids (FISH OIL) 1200 MG CAPS Take 500 mg by mouth daily.     [name] tiZANidine (ZANAFLEX) 2 MG tablet Take 2 mg by mouth at bedtime.    . verapamil (VERELAN PM) 240 MG 24 hr capsule Take 240 mg by mouth daily.     . Vitamin D, Cholecalciferol, 400 UNITS TABS Take 400 Units by mouth at bedtime.     No current facility-administered medications on file prior to visit.     PAST MEDICAL HISTORY: Past Medical History:  Diagnosis Date  . Anemia   . Anxiety    takes Xanax daily  . Arthritis   . Back pain   . Blood transfusion   . Breast cancer ([place]) 2013   left breast  . Bronchitis    hx of  . Chronic fatigue syndrome   . Depression    takes Lexapro daily  . Diabetes  mellitus without complication ([place])    boderline  . Diverticulosis   . Edema    feet and legs  . Headache(784.0)   . History of colon polyps 1998   adenomatous  . Hyperlipidemia    takes Niacin daily  . Hypertension    takes Verapamil and Avapro daily  . Joint pain   . OSA (obstructive sleep apnea)   . Personal history of radiation therapy   . Pneumonia 4/12   Albuterol daily as needed  . Radiation 10/29/11-11/24/11   left breast 6100 cGy  . Shortness of breath    with exertion  . Spinal stenosis   . Tinnitus     PAST SURGICAL HISTORY: Past Surgical History:  Procedure Laterality Date  . APPENDECTOMY  1978  . BACK SURGERY    . BELPHAROPTOSIS REPAIR    . BREAST EXCISIONAL BIOPSY Right 02/06/2013  . BREAST LUMPECTOMY  08/2011   left  . BREAST LUMPECTOMY WITH NEEDLE LOCALIZATION Right 02/06/2013   Procedure: RIGHT BREAST LUMPECTOMY WITH NEEDLE LOCALIZATION;  Surgeon: Harl Bowie, MD;  Location: [facility];  Service: General;  Laterality: Right;  . COLONOSCOPY W/ POLYPECTOMY    . COLONOSCOPY WITH PROPOFOL N/A 10/27/2015   Procedure: COLONOSCOPY WITH PROPOFOL;  Surgeon: Mauri Pole, MD;  Location: [facility];  Service: Endoscopy;  Laterality: N/A;  . HARDWARE REMOVAL Left 04/20/2015   Procedure: HARDWARE REMOVAL LEFT LUMBAR FIVE SCREW;  Surgeon: Karie Chimera, MD;  Location: [facility];  Service: Neurosurgery;  Laterality: Left;  . JOINT REPLACEMENT Bilateral    bilateral knee  . POLYPECTOMY  1990's  . THYROID CYST EXCISION  1994  . TOTAL KNEE ARTHROPLASTY  2006   bilateral  . TRANSPHENOIDAL / TRANSNASAL HYPOPHYSECTOMY / RESECTION PITUITARY TUMOR  6/11    SOCIAL HISTORY: Social History   Tobacco Use  . Smoking status: Never Smoker  . Smokeless tobacco: Never Used  Substance Use Topics  . Alcohol use: No  . Drug use: No    FAMILY HISTORY: Family History  Problem Relation Age of Onset  . Heart disease Father   . Clotting disorder Father   . Stroke Father     . Hypertension Father   . Heart Problems Father   . Stroke Mother   . Diabetes Mother   . Hypertension Mother   . Hyperlipidemia Mother   . Obesity Mother   . Cancer Sister 74  Breast Cancer  . Breast cancer Sister 59  . Colon cancer Neg Hx   . Neuropathy Neg Hx     ROS: Review of Systems  Constitutional: Negative for weight loss.  Cardiovascular: Negative for chest pain.  Gastrointestinal: Negative for diarrhea, nausea and vomiting.  Musculoskeletal:       Negative for muscle weakness  Endo/Heme/Allergies:       Negative for hypoglycemia Negative for polyphagia    PHYSICAL EXAM: Blood pressure 115/64, pulse 74, height 5' 6" (1.676 m), weight 271 lb (122.9 kg), SpO2 98 %. Body mass index is 43.74 kg/m. Physical Exam Vitals signs reviewed.  Constitutional:      Appearance: Normal appearance. She is obese.  Cardiovascular:     Rate and Rhythm: Normal rate.     Pulses: Normal pulses.  Pulmonary:     Effort: Pulmonary effort is normal.  Musculoskeletal: Normal range of motion.  Skin:    General: Skin is warm and dry.  Neurological:     Mental Status: She is alert and oriented to person, place, and time.  Psychiatric:        Mood and Affect: Mood normal.        Behavior: Behavior normal.     RECENT LABS AND TESTS: BMET    Component Value Date/Time   NA 144 03/28/2018 0941   NA 140 06/01/2016 1143   K 4.4 03/28/2018 0941   K 4.0 06/01/2016 1143   CL 104 03/28/2018 0941   CL 105 12/09/2012 1423   CO2 24 03/28/2018 0941   CO2 26 06/01/2016 1143   GLUCOSE 97 03/28/2018 0941   GLUCOSE 126 08/30/2017 0736   GLUCOSE 121 06/01/2016 1143   GLUCOSE 177 (H) 12/09/2012 1423   BUN 19 03/28/2018 0941   BUN 12.5 06/01/2016 1143   CREATININE 1.19 (H) 03/28/2018 0941   CREATININE 1.17 (H) 08/30/2017 0736   CREATININE 1.1 06/01/2016 1143   CALCIUM 10.2 03/28/2018 0941   CALCIUM 10.1 06/01/2016 1143   GFRNONAA 45 (L) 03/28/2018 0941   GFRNONAA 45 (L)  08/30/2017 0736   GFRAA 52 (L) 03/28/2018 0941   GFRAA 52 (L) 08/30/2017 0736   Lab Results  Component Value Date   HGBA1C 6.1 (H) 03/28/2018   HGBA1C 6.8 (H) 05/10/2017   HGBA1C 6.2 (H) 12/14/2016   HGBA1C 6.3 (H) 08/31/2016   HGBA1C (H) 11/11/2010    6.7 (NOTE)                                                                       According to the ADA Clinical Practice Recommendations for 2011, when HbA1c is used as a screening test:   >=6.5%   Diagnostic of Diabetes Mellitus           (if abnormal result  is confirmed)  5.7-6.4%   Increased risk of developing Diabetes Mellitus  References:Diagnosis and Classification of Diabetes Mellitus,Diabetes NIOE,7035,00(XFGHW 1):S62-S69 and Standards of Medical Care in         Diabetes - 2011,Diabetes Care,2011,34  (Suppl 1):S11-S61.   Lab Results  Component Value Date   INSULIN 20.7 03/28/2018   INSULIN 10.5 05/10/2017   INSULIN 20.9 12/14/2016   INSULIN 23.5 08/31/2016   CBC    Component Value  Date/Time   WBC 7.1 08/30/2017 0736   WBC 14.5 (H) 08/21/2016 2027   RBC 4.05 08/30/2017 0736   HGB 10.7 (L) 08/30/2017 0736   HGB 11.0 (L) 05/10/2017 1006   HGB 11.1 (L) 06/01/2016 1143   HCT 32.7 (L) 08/30/2017 0736   HCT 34.4 05/10/2017 1006   HCT 34.9 06/01/2016 1143   PLT 243 08/30/2017 0736   PLT 248 06/01/2016 1143   MCV 80.7 08/30/2017 0736   MCV 80 05/10/2017 1006   MCV 81.2 06/01/2016 1143   MCH 26.3 08/30/2017 0736   MCHC 32.6 08/30/2017 0736   RDW 15.8 (H) 08/30/2017 0736   RDW 16.5 (H) 05/10/2017 1006   RDW 15.8 (H) 06/01/2016 1143   LYMPHSABS 1.8 08/30/2017 0736   LYMPHSABS 1.3 05/10/2017 1006   LYMPHSABS 1.8 06/01/2016 1143   MONOABS 0.5 08/30/2017 0736   MONOABS 0.6 06/01/2016 1143   EOSABS 0.1 08/30/2017 0736   EOSABS 0.1 05/10/2017 1006   BASOSABS 0.0 08/30/2017 0736   BASOSABS 0.0 05/10/2017 1006   BASOSABS 0.0 06/01/2016 1143   Iron/TIBC/Ferritin/ %Sat    Component Value Date/Time   IRON 55 12/14/2016 1146    TIBC 329 12/14/2016 1146   FERRITIN 479 (H) 12/14/2016 1146   FERRITIN 362 (H) 04/15/2015 1058   IRONPCTSAT 17 12/14/2016 1146   Lipid Panel     Component Value Date/Time   CHOL 230 (H) 03/28/2018 0941   TRIG 125 03/28/2018 0941   HDL 41 03/28/2018 0941   CHOLHDL 6.4 11/11/2010 0555   VLDL 17 11/11/2010 0555   LDLCALC 164 (H) 03/28/2018 0941   Hepatic Function Panel     Component Value Date/Time   PROT 7.1 03/28/2018 0941   PROT 7.6 06/01/2016 1143   ALBUMIN 4.0 03/28/2018 0941   ALBUMIN 3.2 (L) 06/01/2016 1143   AST 12 03/28/2018 0941   AST 10 08/30/2017 0736   AST 11 06/01/2016 1143   ALT 14 03/28/2018 0941   ALT 9 08/30/2017 0736   ALT 12 06/01/2016 1143   ALKPHOS 71 03/28/2018 0941   ALKPHOS 69 06/01/2016 1143   BILITOT 0.4 03/28/2018 0941   BILITOT 0.4 08/30/2017 0736   BILITOT 0.28 06/01/2016 1143     Ref. Range 03/28/2018 09:41  Vitamin D, 25-Hydroxy Latest Ref Range: 30.0 - 100.0 ng/mL 41.9     OBESITY BEHAVIORAL INTERVENTION VISIT  Today's visit was # 25   Starting weight: 280 lbs Starting date: 08/31/2016 Today's weight : 271 lbs Today's date: 09/12/2018 Total lbs lost to date: 9  ASK: We discussed the diagnosis of obesity with [name] today and [name] agreed to give [name] permission to discuss obesity behavioral modification therapy today.  ASSESS: [name] has the diagnosis of obesity and her BMI today is 43.76 [name] is in the action stage of change   ADVISE: [name] was educated on the multiple health risks of obesity as well as the benefit of weight loss to improve her health. She was advised of the need for long term treatment and the importance of lifestyle modifications to improve her current health and to decrease her risk of future health problems.  AGREE: Multiple dietary modification options and treatment options were discussed and  [name] agreed to follow the recommendations documented in the above note.  ARRANGE: Nachelle was educated  on the importance of frequent visits to treat obesity as outlined per CMS and USPSTF guidelines and agreed to schedule her next follow up appointment today.  I, Tammy Wysor, am acting  as transcriptionist for [name] PA-C I, [name], PA-C have reviewed above note and agree with its content

## 2018-09-13 LAB — COMPREHENSIVE METABOLIC PANEL
ALK PHOS: 73 IU/L (ref 39–117)
ALT: 13 IU/L (ref 0–32)
AST: 13 IU/L (ref 0–40)
Albumin/Globulin Ratio: 1.4 (ref 1.2–2.2)
Albumin: 4.1 g/dL (ref 3.7–4.7)
BILIRUBIN TOTAL: 0.3 mg/dL (ref 0.0–1.2)
BUN/Creatinine Ratio: 14 (ref 12–28)
BUN: 17 mg/dL (ref 8–27)
CHLORIDE: 103 mmol/L (ref 96–106)
CO2: 23 mmol/L (ref 20–29)
CREATININE: 1.23 mg/dL — AB (ref 0.57–1.00)
Calcium: 10.3 mg/dL (ref 8.7–10.3)
GFR calc Af Amer: 50 mL/min/{1.73_m2} — ABNORMAL LOW (ref >59)
GFR calc non Af Amer: 43 mL/min/{1.73_m2} — ABNORMAL LOW (ref >59)
GLOBULIN, TOTAL: 3 g/dL (ref 1.5–4.5)
GLUCOSE: 96 mg/dL (ref 65–99)
Potassium: 4.2 mmol/L (ref 3.5–5.2)
SODIUM: 140 mmol/L (ref 134–144)
Total Protein: 7.1 g/dL (ref 6.0–8.5)

## 2018-09-13 LAB — INSULIN, RANDOM: INSULIN: 19.7 u[IU]/mL (ref 2.6–24.9)

## 2018-09-13 LAB — LIPID PANEL WITH LDL/HDL RATIO
Cholesterol, Total: 229 mg/dL — ABNORMAL HIGH (ref 100–199)
HDL: 43 mg/dL (ref >39)
LDL Calculated: 161 mg/dL — ABNORMAL HIGH (ref 0–99)
LDL/HDL RATIO: 3.7 ratio — AB (ref 0.0–3.2)
TRIGLYCERIDES: 127 mg/dL (ref 0–149)
VLDL Cholesterol Cal: 25 mg/dL (ref 5–40)

## 2018-09-13 LAB — HEMOGLOBIN A1C
ESTIMATED AVERAGE GLUCOSE: 128 mg/dL
HEMOGLOBIN A1C: 6.1 % — AB (ref 4.8–5.6)

## 2018-09-13 LAB — VITAMIN D 25 HYDROXY (VIT D DEFICIENCY, FRACTURES): Vit D, 25-Hydroxy: 51.7 ng/mL (ref 30.0–100.0)

## 2018-09-23 ENCOUNTER — Other Ambulatory Visit (INDEPENDENT_AMBULATORY_CARE_PROVIDER_SITE_OTHER): Payer: Self-pay | Admitting: Physician Assistant

## 2018-09-23 DIAGNOSIS — E119 Type 2 diabetes mellitus without complications: Secondary | ICD-10-CM

## 2018-10-03 ENCOUNTER — Ambulatory Visit (INDEPENDENT_AMBULATORY_CARE_PROVIDER_SITE_OTHER): Payer: Medicare Other | Admitting: Physician Assistant

## 2018-10-03 VITALS — BP 140/76 | HR 67 | Temp 98.3°F | Ht 66.0 in | Wt 270.0 lb

## 2018-10-03 DIAGNOSIS — E119 Type 2 diabetes mellitus without complications: Secondary | ICD-10-CM

## 2018-10-03 DIAGNOSIS — Z6841 Body Mass Index (BMI) 40.0 and over, adult: Secondary | ICD-10-CM | POA: Diagnosis not present

## 2018-10-03 DIAGNOSIS — E559 Vitamin D deficiency, unspecified: Secondary | ICD-10-CM | POA: Diagnosis not present

## 2018-10-03 MED ORDER — VITAMIN D (ERGOCALCIFEROL) 1.25 MG (50000 UNIT) PO CAPS: 50000.0000 [IU] | ORAL_CAPSULE | ORAL | 0 refills | Status: DC

## 2018-10-03 MED ORDER — METFORMIN HCL 500 MG PO TABS: ORAL_TABLET | ORAL | 0 refills | Status: DC

## 2018-10-03 NOTE — Progress Notes (Signed)
Office: [phone]  /  Fax: [phone]   HPI:   Chief Complaint: OBESITY [name] is here to discuss her progress with her obesity treatment plan. She is on the Category 2 plan and is following her eating plan approximately 70% of the time. She states she is exercising 0 minutes 0 times per week. [name] did well with weight loss. She is working to get all of her protein in. She reports that she does not drink enough water. Her weight is 270 lb (122.5 kg) today and has had a weight loss of 1 pound over a period of 3 weeks since her last visit. She has lost 10 lbs since starting treatment with [name].  Diabetes II with Peripheral Neuropathy [name] has a diagnosis of diabetes type II with peripheral neuropathy and is on metformin. [name] does not report checking her blood sugars at home. Last A1c was reported as 6.1 on 09/12/2018. She has been working on intensive lifestyle modifications including diet, exercise, and weight loss to help control her blood glucose levels. She denies nausea, vomiting, or diarrhea.  Vitamin D deficiency [name] has a diagnosis of Vitamin D deficiency and her last level was at goal. She is currently taking prescription Vit D and denies nausea, vomiting or muscle weakness.  ASSESSMENT AND PLAN:  Type 2 diabetes mellitus without complication, without long-term current use of insulin (HCC) - Plan: metFORMIN (GLUCOPHAGE) 500 MG tablet  Vitamin D deficiency - Plan: Vitamin D, Ergocalciferol, (DRISDOL) 1.25 MG (50000 UT) CAPS capsule  Class 3 severe obesity with serious comorbidity and body mass index (BMI) of 40.0 to 44.9 in adult, unspecified obesity type ([place])  PLAN:  Diabetes II with Peripheral Neuropathy [name] has been given extensive diabetes education by myself today including ideal fasting and post-prandial blood glucose readings, individual ideal HgA1c goals  and hypoglycemia prevention. We discussed the importance of good blood sugar control to decrease the  likelihood of diabetic complications such as nephropathy, neuropathy, limb loss, blindness, coronary artery disease, and death. We discussed the importance of intensive lifestyle modification including diet, exercise and weight loss as the first line treatment for diabetes. Bettywas given a refill on her metformin #30 with 0 refills and agrees to follow-up with our clinic in 2 weeks.  Vitamin D Deficiency [name] was informed that low Vitamin D levels contributes to fatigue and are associated with obesity, breast, and colon cancer. She agrees to continue to take prescription Vit D @ 50,000 IU every week #4 with 0 refills and will follow-up for routine testing of Vitamin D, at least 2-3 times per year. She was informed of the risk of over-replacement of Vitamin D and agrees to not increase her dose unless she discusses this with [name] first. [name] agrees to follow-up with our clinic in 2 weeks.  Obesity [name] is currently in the action stage of change. As such, her goal is to continue with weight loss efforts. She has agreed to follow the Category 2 plan. [name] has been instructed to work up to a goal of 150 minutes of combined cardio and strengthening exercise per week for weight loss and overall health benefits. We discussed the following Behavioral Modification Strategies today: increasing lean protein intake, increase H20 intake, and work on meal planning and easy cooking plans.  [name] has agreed to follow-up with our clinic in 2 weeks. She was informed of the importance of frequent follow up visits to maximize her success with intensive lifestyle modifications for her multiple health conditions.  ALLERGIES:  Allergies  Allergen Reactions  . Cymbalta [Duloxetine Hcl] Other (See Comments)    Stomach pain  . Gabapentin Itching  . Lipitor [Atorvastatin] Other (See Comments)    Cramps  . Lisinopril Swelling  . Maxzide [Hydrochlorothiazide W-Triamterene] Other (See Comments)    Cramping   .  Pravastatin Sodium Other (See Comments)    Headache   . Simvastatin Other (See Comments)    Memory loss  . Lodine [Etodolac] Itching    MEDICATIONS: Current Outpatient Medications on File Prior to Visit  Medication Sig Dispense Refill  . amitriptyline (ELAVIL) 25 MG tablet Take 25 mg by mouth every morning.     [name] aspirin 81 MG tablet Take 81 mg by mouth daily.    [name] atorvastatin (LIPITOR) 20 MG tablet Take 20 mg by mouth once a week.    . blood glucose meter kit and supplies KIT Dispense based on patient and insurance preference. Use up to four times daily as directed. (FOR ICD-9 250.00, 250.01). 1 each 0  . DUREZOL 0.05 % EMUL Place 1 drop into the left eye daily.  0  . escitalopram (LEXAPRO) 10 MG tablet Take 10 mg by mouth daily.    . ferrous sulfate 325 (65 FE) MG tablet Take 1 tablet (325 mg total) by mouth daily with breakfast. 30 tablet 0  . fluticasone (CUTIVATE) 0.05 % cream Apply 1 application topically 2 (two) times daily.  0  . hydrochlorothiazide (HYDRODIURIL) 25 MG tablet Take 1 tablet (25 mg total) by mouth daily. 30 tablet 0  . hydrOXYzine (ATARAX/VISTARIL) 10 MG tablet Take 10 mg by mouth 3 (three) times daily as needed.    . irbesartan (AVAPRO) 300 MG tablet Take 1 tablet (300 mg total) by mouth at bedtime. 30 tablet 0  . ketoconazole (NIZORAL) 2 % cream Apply 1 application topically daily. 15 g 0  . niacin 500 MG CR capsule Take 500 mg by mouth 2 (two) times daily with a meal.    . Omega-3 Fatty Acids (FISH OIL) 1200 MG CAPS Take 500 mg by mouth daily.     [name] tiZANidine (ZANAFLEX) 2 MG tablet Take 2 mg by mouth at bedtime.    . verapamil (VERELAN PM) 240 MG 24 hr capsule Take 240 mg by mouth daily.     . Vitamin D, Cholecalciferol, 400 UNITS TABS Take 400 Units by mouth at bedtime.     No current facility-administered medications on file prior to visit.     PAST MEDICAL HISTORY: Past Medical History:  Diagnosis Date  . Anemia   . Anxiety    takes Xanax daily  .  Arthritis   . Back pain   . Blood transfusion   . Breast cancer ([place]) 2013   left breast  . Bronchitis    hx of  . Chronic fatigue syndrome   . Depression    takes Lexapro daily  . Diabetes mellitus without complication ([place])    boderline  . Diverticulosis   . Edema    feet and legs  . Headache(784.0)   . History of colon polyps 1998   adenomatous  . Hyperlipidemia    takes Niacin daily  . Hypertension    takes Verapamil and Avapro daily  . Joint pain   . OSA (obstructive sleep apnea)   . Personal history of radiation therapy   . Pneumonia 4/12   Albuterol daily as needed  . Radiation 10/29/11-11/24/11   left breast 6100 cGy  . Shortness of breath  with exertion  . Spinal stenosis   . Tinnitus     PAST SURGICAL HISTORY: Past Surgical History:  Procedure Laterality Date  . APPENDECTOMY  1978  . BACK SURGERY    . BELPHAROPTOSIS REPAIR    . BREAST EXCISIONAL BIOPSY Right 02/06/2013  . BREAST LUMPECTOMY  08/2011   left  . BREAST LUMPECTOMY WITH NEEDLE LOCALIZATION Right 02/06/2013   Procedure: RIGHT BREAST LUMPECTOMY WITH NEEDLE LOCALIZATION;  Surgeon: Harl Bowie, MD;  Location: [facility];  Service: General;  Laterality: Right;  . COLONOSCOPY W/ POLYPECTOMY    . COLONOSCOPY WITH PROPOFOL N/A 10/27/2015   Procedure: COLONOSCOPY WITH PROPOFOL;  Surgeon: Mauri Pole, MD;  Location: [facility];  Service: Endoscopy;  Laterality: N/A;  . HARDWARE REMOVAL Left 04/20/2015   Procedure: HARDWARE REMOVAL LEFT LUMBAR FIVE SCREW;  Surgeon: Karie Chimera, MD;  Location: [facility];  Service: Neurosurgery;  Laterality: Left;  . JOINT REPLACEMENT Bilateral    bilateral knee  . POLYPECTOMY  1990's  . THYROID CYST EXCISION  1994  . TOTAL KNEE ARTHROPLASTY  2006   bilateral  . TRANSPHENOIDAL / TRANSNASAL HYPOPHYSECTOMY / RESECTION PITUITARY TUMOR  6/11    SOCIAL HISTORY: Social History   Tobacco Use  . Smoking status: Never Smoker  . Smokeless tobacco: Never Used    Substance Use Topics  . Alcohol use: No  . Drug use: No    FAMILY HISTORY: Family History  Problem Relation Age of Onset  . Heart disease Father   . Clotting disorder Father   . Stroke Father   . Hypertension Father   . Heart Problems Father   . Stroke Mother   . Diabetes Mother   . Hypertension Mother   . Hyperlipidemia Mother   . Obesity Mother   . Cancer Sister 74       Breast Cancer  . Breast cancer Sister 67  . Colon cancer Neg Hx   . Neuropathy Neg Hx    ROS: Review of Systems  Constitutional: Positive for weight loss.  Gastrointestinal: Negative for diarrhea, nausea and vomiting.  Musculoskeletal:       Negative for muscle weakness.  Endo/Heme/Allergies:       Negative for hypoglycemia.   PHYSICAL EXAM: Blood pressure 140/76, pulse 67, temperature 98.3 F (36.8 C), temperature source Oral, height '5\' 6"'$  (1.676 m), weight 270 lb (122.5 kg), SpO2 99 %. Body mass index is 43.58 kg/m. Physical Exam Vitals signs reviewed.  Constitutional:      Appearance: Normal appearance. She is obese.  Cardiovascular:     Rate and Rhythm: Normal rate.     Pulses: Normal pulses.  Pulmonary:     Effort: Pulmonary effort is normal.     Breath sounds: Normal breath sounds.  Musculoskeletal: Normal range of motion.  Skin:    General: Skin is warm and dry.  Neurological:     Mental Status: She is alert and oriented to person, place, and time.  Psychiatric:        Behavior: Behavior normal.   RECENT LABS AND TESTS: BMET    Component Value Date/Time   NA 140 09/12/2018 0916   NA 140 06/01/2016 1143   K 4.2 09/12/2018 0916   K 4.0 06/01/2016 1143   CL 103 09/12/2018 0916   CL 105 12/09/2012 1423   CO2 23 09/12/2018 0916   CO2 26 06/01/2016 1143   GLUCOSE 96 09/12/2018 0916   GLUCOSE 126 08/30/2017 0736   GLUCOSE 121 06/01/2016 1143  GLUCOSE 177 (H) 12/09/2012 1423   BUN 17 09/12/2018 0916   BUN 12.5 06/01/2016 1143   CREATININE 1.23 (H) 09/12/2018 0916    CREATININE 1.17 (H) 08/30/2017 0736   CREATININE 1.1 06/01/2016 1143   CALCIUM 10.3 09/12/2018 0916   CALCIUM 10.1 06/01/2016 1143   GFRNONAA 43 (L) 09/12/2018 0916   GFRNONAA 45 (L) 08/30/2017 0736   GFRAA 50 (L) 09/12/2018 0916   GFRAA 52 (L) 08/30/2017 0736   Lab Results  Component Value Date   HGBA1C 6.1 (H) 09/12/2018   HGBA1C 6.1 (H) 03/28/2018   HGBA1C 6.8 (H) 05/10/2017   HGBA1C 6.2 (H) 12/14/2016   HGBA1C 6.3 (H) 08/31/2016   Lab Results  Component Value Date   INSULIN 19.7 09/12/2018   INSULIN 20.7 03/28/2018   INSULIN 10.5 05/10/2017   INSULIN 20.9 12/14/2016   INSULIN 23.5 08/31/2016   CBC    Component Value Date/Time   WBC 7.1 08/30/2017 0736   WBC 14.5 (H) 08/21/2016 2027   RBC 4.05 08/30/2017 0736   HGB 10.7 (L) 08/30/2017 0736   HGB 11.0 (L) 05/10/2017 1006   HGB 11.1 (L) 06/01/2016 1143   HCT 32.7 (L) 08/30/2017 0736   HCT 34.4 05/10/2017 1006   HCT 34.9 06/01/2016 1143   PLT 243 08/30/2017 0736   PLT 248 06/01/2016 1143   MCV 80.7 08/30/2017 0736   MCV 80 05/10/2017 1006   MCV 81.2 06/01/2016 1143   MCH 26.3 08/30/2017 0736   MCHC 32.6 08/30/2017 0736   RDW 15.8 (H) 08/30/2017 0736   RDW 16.5 (H) 05/10/2017 1006   RDW 15.8 (H) 06/01/2016 1143   LYMPHSABS 1.8 08/30/2017 0736   LYMPHSABS 1.3 05/10/2017 1006   LYMPHSABS 1.8 06/01/2016 1143   MONOABS 0.5 08/30/2017 0736   MONOABS 0.6 06/01/2016 1143   EOSABS 0.1 08/30/2017 0736   EOSABS 0.1 05/10/2017 1006   BASOSABS 0.0 08/30/2017 0736   BASOSABS 0.0 05/10/2017 1006   BASOSABS 0.0 06/01/2016 1143   Iron/TIBC/Ferritin/ %Sat    Component Value Date/Time   IRON 55 12/14/2016 1146   TIBC 329 12/14/2016 1146   FERRITIN 479 (H) 12/14/2016 1146   FERRITIN 362 (H) 04/15/2015 1058   IRONPCTSAT 17 12/14/2016 1146   Lipid Panel     Component Value Date/Time   CHOL 229 (H) 09/12/2018 0916   TRIG 127 09/12/2018 0916   HDL 43 09/12/2018 0916   CHOLHDL 6.4 11/11/2010 0555   VLDL 17 11/11/2010  0555   LDLCALC 161 (H) 09/12/2018 0916   Hepatic Function Panel     Component Value Date/Time   PROT 7.1 09/12/2018 0916   PROT 7.6 06/01/2016 1143   ALBUMIN 4.1 09/12/2018 0916   ALBUMIN 3.2 (L) 06/01/2016 1143   AST 13 09/12/2018 0916   AST 10 08/30/2017 0736   AST 11 06/01/2016 1143   ALT 13 09/12/2018 0916   ALT 9 08/30/2017 0736   ALT 12 06/01/2016 1143   ALKPHOS 73 09/12/2018 0916   ALKPHOS 69 06/01/2016 1143   BILITOT 0.3 09/12/2018 0916   BILITOT 0.4 08/30/2017 0736   BILITOT 0.28 06/01/2016 1143     Ref. Range 09/12/2018 09:16  Vitamin D, 25-Hydroxy Latest Ref Range: 30.0 - 100.0 ng/mL 51.7   OBESITY BEHAVIORAL INTERVENTION VISIT  Today's visit was #42  Starting weight: 280 lbs Starting date: 08/31/2016 Today's weight: 270 lbs Today's date: 10/03/2018 Total lbs lost to date: 10 At least 15 minutes were spent on discussing the following behavioral intervention visit.  10/03/2018  Height 5' 6" (1.676 m)  Weight 270 lb (122.5 kg)  BMI (Calculated) 43.6  BLOOD PRESSURE - SYSTOLIC 668  BLOOD PRESSURE - DIASTOLIC 76   Body Fat % 93.7 %  Total Body Water (lbs) 99.6 lbs   ASK: We discussed the diagnosis of obesity with Luna Kitchens today and Graham agreed to give [name] permission to discuss obesity behavioral modification therapy today.  ASSESS: Qunisha has the diagnosis of obesity and her BMI today is 43.6. Jazmina is in the action stage of change.   ADVISE: Tamarra was educated on the multiple health risks of obesity as well as the benefit of weight loss to improve her health. She was advised of the need for long term treatment and the importance of lifestyle modifications to improve her current health and to decrease her risk of future health problems.  AGREE: Multiple dietary modification options and treatment options were discussed and  Kaysi agreed to follow the recommendations documented in the above note.  ARRANGE: Gerry was educated on the importance of  frequent visits to treat obesity as outlined per CMS and USPSTF guidelines and agreed to schedule her next follow up appointment today.  Migdalia Dk, am acting as transcriptionist for [name], PA-C I, [name], PA-C have reviewed above note and agree with its content

## 2018-10-22 ENCOUNTER — Encounter (INDEPENDENT_AMBULATORY_CARE_PROVIDER_SITE_OTHER): Payer: Self-pay

## 2018-10-22 ENCOUNTER — Ambulatory Visit (INDEPENDENT_AMBULATORY_CARE_PROVIDER_SITE_OTHER): Payer: Medicare Other | Admitting: Physician Assistant

## 2018-10-24 ENCOUNTER — Encounter (INDEPENDENT_AMBULATORY_CARE_PROVIDER_SITE_OTHER): Payer: Self-pay

## 2018-11-11 ENCOUNTER — Encounter: Payer: Self-pay | Admitting: Gastroenterology

## 2018-12-03 ENCOUNTER — Other Ambulatory Visit (INDEPENDENT_AMBULATORY_CARE_PROVIDER_SITE_OTHER): Payer: Self-pay | Admitting: Physician Assistant

## 2018-12-03 ENCOUNTER — Encounter (INDEPENDENT_AMBULATORY_CARE_PROVIDER_SITE_OTHER): Payer: Self-pay

## 2018-12-03 DIAGNOSIS — I10 Essential (primary) hypertension: Secondary | ICD-10-CM

## 2019-01-01 ENCOUNTER — Other Ambulatory Visit (INDEPENDENT_AMBULATORY_CARE_PROVIDER_SITE_OTHER): Payer: Self-pay | Admitting: Physician Assistant

## 2019-01-01 DIAGNOSIS — I10 Essential (primary) hypertension: Secondary | ICD-10-CM

## 2019-01-01 DIAGNOSIS — E559 Vitamin D deficiency, unspecified: Secondary | ICD-10-CM

## 2019-01-07 ENCOUNTER — Telehealth (INDEPENDENT_AMBULATORY_CARE_PROVIDER_SITE_OTHER): Payer: Self-pay | Admitting: Physician Assistant

## 2019-01-07 NOTE — Telephone Encounter (Signed)
Patient called requesting refills on Vit D and HC PV.  [facility] is now [facility].  I suggested an appointment and scheduled her for tomorrow.  She still wants her refills.

## 2019-01-08 ENCOUNTER — Ambulatory Visit (INDEPENDENT_AMBULATORY_CARE_PROVIDER_SITE_OTHER): Payer: Medicare Other | Admitting: Physician Assistant

## 2019-01-08 ENCOUNTER — Encounter (INDEPENDENT_AMBULATORY_CARE_PROVIDER_SITE_OTHER): Payer: Self-pay | Admitting: Physician Assistant

## 2019-01-08 ENCOUNTER — Other Ambulatory Visit: Payer: Self-pay

## 2019-01-08 DIAGNOSIS — Z6841 Body Mass Index (BMI) 40.0 and over, adult: Secondary | ICD-10-CM

## 2019-01-08 DIAGNOSIS — I10 Essential (primary) hypertension: Secondary | ICD-10-CM | POA: Diagnosis not present

## 2019-01-08 DIAGNOSIS — E1140 Type 2 diabetes mellitus with diabetic neuropathy, unspecified: Secondary | ICD-10-CM

## 2019-01-08 DIAGNOSIS — E559 Vitamin D deficiency, unspecified: Secondary | ICD-10-CM

## 2019-01-09 MED ORDER — HYDROCHLOROTHIAZIDE 25 MG PO TABS: 25.0000 mg | ORAL_TABLET | Freq: Every day | ORAL | 0 refills | Status: DC

## 2019-01-09 MED ORDER — VITAMIN D (ERGOCALCIFEROL) 1.25 MG (50000 UNIT) PO CAPS: 50000.0000 [IU] | ORAL_CAPSULE | ORAL | 0 refills | Status: DC

## 2019-01-09 NOTE — Progress Notes (Signed)
Office: [phone]  /  Fax: [phone] TeleHealth Visit:  [name] has verbally consented to this TeleHealth visit today. The patient is located at home, the provider is located at the [facility] office. The participants in this visit include the listed provider and patient. The visit was conducted today via Telephone.  HPI:   Chief Complaint: OBESITY [name] is here to discuss her progress with her obesity treatment plan. She is on the Category 2 plan and is following her eating plan approximately 50 % of the time. She states she is riding a bike 5 to 10 minutes 3 times per week. [name] reports that she has been eating more sweets and not getting enough protein. She believes that she has gained about 3 to 4 pounds since her last visit.  We were unable to weigh the patient today for this TeleHealth visit. She feels as if she has gained weight since her last visit. She has lost 10 lbs since starting treatment with [name].  Diabetes II with Peripheral Neuropathy [name] has a diagnosis of diabetes type II. [name] states that she does not check blood sugars and her last A1c was 6.1 on 09/12/18. She is taking metformin. She has been working on intensive lifestyle modifications including diet, exercise, and weight loss to help control her blood glucose levels.  Vitamin D deficiency [name] has a diagnosis of vitamin D deficiency. She is currently taking vit D and denies nausea, vomiting, or muscle weakness.  Hypertension [name] is a 73 y.o. female with hypertension. [name] is on HCTZ, Avapro, and Verapamil. She is working on weight loss to help control her blood pressure with the goal of decreasing her risk of heart attack and stroke. [name] denies chest pain.  ASSESSMENT AND PLAN:  Type 2 diabetes mellitus with diabetic neuropathy, without long-term current use of insulin (HCC)  Vitamin D deficiency - Plan: Vitamin D, Ergocalciferol, (DRISDOL) 1.25 MG (50000 UT) CAPS  capsule  Essential hypertension - Plan: hydrochlorothiazide (HYDRODIURIL) 25 MG tablet  Class 3 severe obesity with serious comorbidity and body mass index (BMI) of 40.0 to 44.9 in adult, unspecified obesity type (HCC)  PLAN:  Diabetes II with Peripheral Neuropathy [name] has been given extensive diabetes education by myself today including ideal fasting and post-prandial blood glucose readings, individual ideal Hgb A1c goals, and hypoglycemia prevention. We discussed the importance of good blood sugar control to decrease the likelihood of diabetic complications such as nephropathy, neuropathy, limb loss, blindness, coronary artery disease, and death. We discussed the importance of intensive lifestyle modification including diet, exercise, and weight loss as the first line treatment for diabetes. [name] agrees to continue her diabetes medications and weight loss and will follow up at the agreed upon time in 2 weeks.   Vitamin D Deficiency [name] was informed that low vitamin D levels contribute to fatigue and are associated with obesity, breast, and colon cancer. [name] agrees to continue to take prescription Vit D '@50' ,000 IU every week #4 with no refills and will follow up for routine testing of vitamin D, at least 2-3 times per year. She was informed of the risk of over-replacement of vitamin D and agrees to not increase her dose unless she discusses this with [name] first. [name] agrees to follow up in 2 weeks as directed.  Hypertension We discussed sodium restriction, working on healthy weight loss, and a regular exercise program as the means to achieve improved blood pressure control. We will continue to monitor her blood pressure  as well as her progress with the above lifestyle modifications. She will continue her medications as prescribed and we will refill HCTZ 25 mg qd #30 with no refills. She will watch for signs of hypotension as she continues her lifestyle modifications. [name] agreed with this plan  and agreed to follow up as directed.  Obesity [name] is currently in the action stage of change. As such, her goal is to continue with weight loss efforts. She has agreed to follow the Category 2 plan. [name] has been instructed to work up to a goal of 150 minutes of combined cardio and strengthening exercise per week for weight loss and overall health benefits. We discussed the following Behavioral Modification Strategies today: increasing lean protein intake and work on meal planning and easy cooking plans.  [name] has agreed to follow up with our clinic in 2 weeks. She was informed of the importance of frequent follow up visits to maximize her success with intensive lifestyle modifications for her multiple health conditions.  ALLERGIES: Allergies  Allergen Reactions  . Cymbalta [Duloxetine Hcl] Other (See Comments)    Stomach pain  . Gabapentin Itching  . Lipitor [Atorvastatin] Other (See Comments)    Cramps  . Lisinopril Swelling  . Maxzide [Hydrochlorothiazide W-Triamterene] Other (See Comments)    Cramping   . Pravastatin Sodium Other (See Comments)    Headache   . Simvastatin Other (See Comments)    Memory loss  . Lodine [Etodolac] Itching    MEDICATIONS: Current Outpatient Medications on File Prior to Visit  Medication Sig Dispense Refill  . amitriptyline (ELAVIL) 25 MG tablet Take 25 mg by mouth every morning.     [name] aspirin 81 MG tablet Take 81 mg by mouth daily.    [name] atorvastatin (LIPITOR) 20 MG tablet Take 20 mg by mouth once a week.    . blood glucose meter kit and supplies KIT Dispense based on patient and insurance preference. Use up to four times daily as directed. (FOR ICD-9 250.00, 250.01). 1 each 0  . DUREZOL 0.05 % EMUL Place 1 drop into the left eye daily.  0  . escitalopram (LEXAPRO) 10 MG tablet Take 10 mg by mouth daily.    . ferrous sulfate 325 (65 FE) MG tablet Take 1 tablet (325 mg total) by mouth daily with breakfast. 30 tablet 0  . fluticasone  (CUTIVATE) 0.05 % cream Apply 1 application topically 2 (two) times daily.  0  . hydrOXYzine (ATARAX/VISTARIL) 10 MG tablet Take 10 mg by mouth 3 (three) times daily as needed.    . irbesartan (AVAPRO) 300 MG tablet Take 1 tablet (300 mg total) by mouth at bedtime. 30 tablet 0  . ketoconazole (NIZORAL) 2 % cream Apply 1 application topically daily. 15 g 0  . metFORMIN (GLUCOPHAGE) 500 MG tablet take 1 tablet by mouth once daily with BREAKFAST 30 tablet 0  . niacin 500 MG CR capsule Take 500 mg by mouth 2 (two) times daily with a meal.    . Omega-3 Fatty Acids (FISH OIL) 1200 MG CAPS Take 500 mg by mouth daily.     [name] tiZANidine (ZANAFLEX) 2 MG tablet Take 2 mg by mouth at bedtime.    . verapamil (VERELAN PM) 240 MG 24 hr capsule Take 240 mg by mouth daily.     . Vitamin D, Cholecalciferol, 400 UNITS TABS Take 400 Units by mouth at bedtime.     No current facility-administered medications on file prior to visit.  PAST MEDICAL HISTORY: Past Medical History:  Diagnosis Date  . Anemia   . Anxiety    takes Xanax daily  . Arthritis   . Back pain   . Blood transfusion   . Breast cancer ([place]) 2013   left breast  . Bronchitis    hx of  . Chronic fatigue syndrome   . Depression    takes Lexapro daily  . Diabetes mellitus without complication ([place])    boderline  . Diverticulosis   . Edema    feet and legs  . Headache(784.0)   . History of colon polyps 1998   adenomatous  . Hyperlipidemia    takes Niacin daily  . Hypertension    takes Verapamil and Avapro daily  . Joint pain   . OSA (obstructive sleep apnea)   . Personal history of radiation therapy   . Pneumonia 4/12   Albuterol daily as needed  . Radiation 10/29/11-11/24/11   left breast 6100 cGy  . Shortness of breath    with exertion  . Spinal stenosis   . Tinnitus     PAST SURGICAL HISTORY: Past Surgical History:  Procedure Laterality Date  . APPENDECTOMY  1978  . BACK SURGERY    . BELPHAROPTOSIS REPAIR    .  BREAST EXCISIONAL BIOPSY Right 02/06/2013  . BREAST LUMPECTOMY  08/2011   left  . BREAST LUMPECTOMY WITH NEEDLE LOCALIZATION Right 02/06/2013   Procedure: RIGHT BREAST LUMPECTOMY WITH NEEDLE LOCALIZATION;  Surgeon: Harl Bowie, MD;  Location: [facility];  Service: General;  Laterality: Right;  . COLONOSCOPY W/ POLYPECTOMY    . COLONOSCOPY WITH PROPOFOL N/A 10/27/2015   Procedure: COLONOSCOPY WITH PROPOFOL;  Surgeon: Mauri Pole, MD;  Location: [facility];  Service: Endoscopy;  Laterality: N/A;  . HARDWARE REMOVAL Left 04/20/2015   Procedure: HARDWARE REMOVAL LEFT LUMBAR FIVE SCREW;  Surgeon: Karie Chimera, MD;  Location: [facility];  Service: Neurosurgery;  Laterality: Left;  . JOINT REPLACEMENT Bilateral    bilateral knee  . POLYPECTOMY  1990's  . THYROID CYST EXCISION  1994  . TOTAL KNEE ARTHROPLASTY  2006   bilateral  . TRANSPHENOIDAL / TRANSNASAL HYPOPHYSECTOMY / RESECTION PITUITARY TUMOR  6/11    SOCIAL HISTORY: Social History   Tobacco Use  . Smoking status: Never Smoker  . Smokeless tobacco: Never Used  Substance Use Topics  . Alcohol use: No  . Drug use: No    FAMILY HISTORY: Family History  Problem Relation Age of Onset  . Heart disease Father   . Clotting disorder Father   . Stroke Father   . Hypertension Father   . Heart Problems Father   . Stroke Mother   . Diabetes Mother   . Hypertension Mother   . Hyperlipidemia Mother   . Obesity Mother   . Cancer Sister 41       Breast Cancer  . Breast cancer Sister 78  . Colon cancer Neg Hx   . Neuropathy Neg Hx     ROS: Review of Systems  Cardiovascular: Negative for chest pain.  Gastrointestinal: Negative for nausea and vomiting.  Musculoskeletal:       Negative for muscle weakness.    PHYSICAL EXAM: Pt in no acute distress  RECENT LABS AND TESTS: BMET    Component Value Date/Time   NA 140 09/12/2018 0916   NA 140 06/01/2016 1143   K 4.2 09/12/2018 0916   K 4.0 06/01/2016 1143   CL 103  09/12/2018 0916   CL  105 12/09/2012 1423   CO2 23 09/12/2018 0916   CO2 26 06/01/2016 1143   GLUCOSE 96 09/12/2018 0916   GLUCOSE 126 08/30/2017 0736   GLUCOSE 121 06/01/2016 1143   GLUCOSE 177 (H) 12/09/2012 1423   BUN 17 09/12/2018 0916   BUN 12.5 06/01/2016 1143   CREATININE 1.23 (H) 09/12/2018 0916   CREATININE 1.17 (H) 08/30/2017 0736   CREATININE 1.1 06/01/2016 1143   CALCIUM 10.3 09/12/2018 0916   CALCIUM 10.1 06/01/2016 1143   GFRNONAA 43 (L) 09/12/2018 0916   GFRNONAA 45 (L) 08/30/2017 0736   GFRAA 50 (L) 09/12/2018 0916   GFRAA 52 (L) 08/30/2017 0736   Lab Results  Component Value Date   HGBA1C 6.1 (H) 09/12/2018   HGBA1C 6.1 (H) 03/28/2018   HGBA1C 6.8 (H) 05/10/2017   HGBA1C 6.2 (H) 12/14/2016   HGBA1C 6.3 (H) 08/31/2016   Lab Results  Component Value Date   INSULIN 19.7 09/12/2018   INSULIN 20.7 03/28/2018   INSULIN 10.5 05/10/2017   INSULIN 20.9 12/14/2016   INSULIN 23.5 08/31/2016   CBC    Component Value Date/Time   WBC 7.1 08/30/2017 0736   WBC 14.5 (H) 08/21/2016 2027   RBC 4.05 08/30/2017 0736   HGB 10.7 (L) 08/30/2017 0736   HGB 11.0 (L) 05/10/2017 1006   HGB 11.1 (L) 06/01/2016 1143   HCT 32.7 (L) 08/30/2017 0736   HCT 34.4 05/10/2017 1006   HCT 34.9 06/01/2016 1143   PLT 243 08/30/2017 0736   PLT 248 06/01/2016 1143   MCV 80.7 08/30/2017 0736   MCV 80 05/10/2017 1006   MCV 81.2 06/01/2016 1143   MCH 26.3 08/30/2017 0736   MCHC 32.6 08/30/2017 0736   RDW 15.8 (H) 08/30/2017 0736   RDW 16.5 (H) 05/10/2017 1006   RDW 15.8 (H) 06/01/2016 1143   LYMPHSABS 1.8 08/30/2017 0736   LYMPHSABS 1.3 05/10/2017 1006   LYMPHSABS 1.8 06/01/2016 1143   MONOABS 0.5 08/30/2017 0736   MONOABS 0.6 06/01/2016 1143   EOSABS 0.1 08/30/2017 0736   EOSABS 0.1 05/10/2017 1006   BASOSABS 0.0 08/30/2017 0736   BASOSABS 0.0 05/10/2017 1006   BASOSABS 0.0 06/01/2016 1143   Iron/TIBC/Ferritin/ %Sat    Component Value Date/Time   IRON 55 12/14/2016 1146    TIBC 329 12/14/2016 1146   FERRITIN 479 (H) 12/14/2016 1146   FERRITIN 362 (H) 04/15/2015 1058   IRONPCTSAT 17 12/14/2016 1146   Lipid Panel     Component Value Date/Time   CHOL 229 (H) 09/12/2018 0916   TRIG 127 09/12/2018 0916   HDL 43 09/12/2018 0916   CHOLHDL 6.4 11/11/2010 0555   VLDL 17 11/11/2010 0555   LDLCALC 161 (H) 09/12/2018 0916   Hepatic Function Panel     Component Value Date/Time   PROT 7.1 09/12/2018 0916   PROT 7.6 06/01/2016 1143   ALBUMIN 4.1 09/12/2018 0916   ALBUMIN 3.2 (L) 06/01/2016 1143   AST 13 09/12/2018 0916   AST 10 08/30/2017 0736   AST 11 06/01/2016 1143   ALT 13 09/12/2018 0916   ALT 9 08/30/2017 0736   ALT 12 06/01/2016 1143   ALKPHOS 73 09/12/2018 0916   ALKPHOS 69 06/01/2016 1143   BILITOT 0.3 09/12/2018 0916   BILITOT 0.4 08/30/2017 0736   BILITOT 0.28 06/01/2016 1143     Results for AUSTINE, KELSAY (MRN [id number]) as of 01/09/2019 12:44  Ref. Range 09/12/2018 09:16  Vitamin D, 25-Hydroxy Latest Ref Range: 30.0 - 100.0 ng/mL 51.7  Lenward Chancellor, CMA, am acting as transcriptionist for [name], PA-C I, [name], PA-C have reviewed above note and agree with its content

## 2019-01-22 ENCOUNTER — Ambulatory Visit (INDEPENDENT_AMBULATORY_CARE_PROVIDER_SITE_OTHER): Payer: Medicare Other | Admitting: Physician Assistant

## 2019-01-22 ENCOUNTER — Encounter (INDEPENDENT_AMBULATORY_CARE_PROVIDER_SITE_OTHER): Payer: Self-pay | Admitting: Physician Assistant

## 2019-01-22 ENCOUNTER — Other Ambulatory Visit: Payer: Self-pay

## 2019-01-22 DIAGNOSIS — Z6841 Body Mass Index (BMI) 40.0 and over, adult: Secondary | ICD-10-CM

## 2019-01-22 DIAGNOSIS — E559 Vitamin D deficiency, unspecified: Secondary | ICD-10-CM | POA: Diagnosis not present

## 2019-01-23 NOTE — Progress Notes (Signed)
Office: [phone]  /  Fax: [phone] TeleHealth Visit:  [name] has verbally consented to this TeleHealth visit today. The patient is located at home, the provider is located at the [employer] [facility] office. The participants in this visit include the listed provider and patient. The visit was conducted today via Telephone.  HPI:   Chief Complaint: OBESITY [name] is here to discuss her progress with her obesity treatment plan. She is on the Category 2 plan and is following her eating plan approximately 70 % of the time. She states she is exercising 0 minutes 0 times per week. [name] reports that she continues to struggle to get all of her protein in on the plan. She is bored with the meat selection for dinner.  We were unable to weigh the patient today for this TeleHealth visit. She feels as if she has maintained weight since her last visit. She has lost 10 lbs since starting treatment with [name].  Vitamin D Deficiency [name] has a diagnosis of vitamin D deficiency. She is currently on vit D. Sarha denies nausea, vomiting, or muscle weakness.  ASSESSMENT AND PLAN:  Vitamin D deficiency  Class 3 severe obesity with serious comorbidity and body mass index (BMI) of 40.0 to 44.9 in adult, unspecified obesity type ([place])  PLAN:  Vitamin D Deficiency [name] was informed that low vitamin D levels contribute to fatigue and are associated with obesity, breast, and colon cancer. [name] agrees to continue to take prescription Vit D _0 ,000 IU every week and will follow up for routine testing of vitamin D, at least 2-3 times per year. She was informed of the risk of over-replacement of vitamin D and agrees to not increase her dose unless she discusses this with [name] first. [name] agrees to follow up in 2 to 3 weeks as directed.  Obesity [name] is currently in the action stage of change. As such, her goal is to continue with weight loss efforts. She has agreed to follow the Category 2  plan. [name] has been instructed to work up to a goal of 150 minutes of combined cardio and strengthening exercise per week for weight loss and overall health benefits. We discussed the following Behavioral Modification Strategies today: increasing lean protein intake and ways to avoid boredom eating.  [name] has agreed to follow up with our clinic in 2 to 3 weeks. She was informed of the importance of frequent follow up visits to maximize her success with intensive lifestyle modifications for her multiple health conditions.  ALLERGIES: Allergies  Allergen Reactions  . Cymbalta [Duloxetine Hcl] Other (See Comments)    Stomach pain  . Gabapentin Itching  . Lipitor [Atorvastatin] Other (See Comments)    Cramps  . Lisinopril Swelling  . Maxzide [Hydrochlorothiazide W-Triamterene] Other (See Comments)    Cramping   . Pravastatin Sodium Other (See Comments)    Headache   . Simvastatin Other (See Comments)    Memory loss  . Lodine [Etodolac] Itching    MEDICATIONS: Current Outpatient Medications on File Prior to Visit  Medication Sig Dispense Refill  . amitriptyline (ELAVIL) 25 MG tablet Take 25 mg by mouth every morning.     [name] aspirin 81 MG tablet Take 81 mg by mouth daily.    [name] atorvastatin (LIPITOR) 20 MG tablet Take 20 mg by mouth once a week.    . blood glucose meter kit and supplies KIT Dispense based on patient and insurance preference. Use up to four times daily as directed. (FOR  ICD-9 250.00, 250.01). 1 each 0  . DUREZOL 0.05 % EMUL Place 1 drop into the left eye daily.  0  . escitalopram (LEXAPRO) 10 MG tablet Take 10 mg by mouth daily.    . ferrous sulfate 325 (65 FE) MG tablet Take 1 tablet (325 mg total) by mouth daily with breakfast. 30 tablet 0  . fluticasone (CUTIVATE) 0.05 % cream Apply 1 application topically 2 (two) times daily.  0  . hydrochlorothiazide (HYDRODIURIL) 25 MG tablet Take 1 tablet (25 mg total) by mouth daily. 30 tablet 0  . hydrOXYzine (ATARAX/VISTARIL)  10 MG tablet Take 10 mg by mouth 3 (three) times daily as needed.    . irbesartan (AVAPRO) 300 MG tablet Take 1 tablet (300 mg total) by mouth at bedtime. 30 tablet 0  . ketoconazole (NIZORAL) 2 % cream Apply 1 application topically daily. 15 g 0  . metFORMIN (GLUCOPHAGE) 500 MG tablet take 1 tablet by mouth once daily with BREAKFAST 30 tablet 0  . niacin 500 MG CR capsule Take 500 mg by mouth 2 (two) times daily with a meal.    . Omega-3 Fatty Acids (FISH OIL) 1200 MG CAPS Take 500 mg by mouth daily.     [name] tiZANidine (ZANAFLEX) 2 MG tablet Take 2 mg by mouth at bedtime.    . verapamil (VERELAN PM) 240 MG 24 hr capsule Take 240 mg by mouth daily.     . Vitamin D, Cholecalciferol, 400 UNITS TABS Take 400 Units by mouth at bedtime.    . Vitamin D, Ergocalciferol, (DRISDOL) 1.25 MG (50000 UT) CAPS capsule Take 1 capsule (50,000 Units total) by mouth every 7 (seven) days. 4 capsule 0   No current facility-administered medications on file prior to visit.     PAST MEDICAL HISTORY: Past Medical History:  Diagnosis Date  . Anemia   . Anxiety    takes Xanax daily  . Arthritis   . Back pain   . Blood transfusion   . Breast cancer ([place]) 2013   left breast  . Bronchitis    hx of  . Chronic fatigue syndrome   . Depression    takes Lexapro daily  . Diabetes mellitus without complication ([place])    boderline  . Diverticulosis   . Edema    feet and legs  . Headache(784.0)   . History of colon polyps 1998   adenomatous  . Hyperlipidemia    takes Niacin daily  . Hypertension    takes Verapamil and Avapro daily  . Joint pain   . OSA (obstructive sleep apnea)   . Personal history of radiation therapy   . Pneumonia 4/12   Albuterol daily as needed  . Radiation 10/29/11-11/24/11   left breast 6100 cGy  . Shortness of breath    with exertion  . Spinal stenosis   . Tinnitus     PAST SURGICAL HISTORY: Past Surgical History:  Procedure Laterality Date  . APPENDECTOMY  1978  . BACK  SURGERY    . BELPHAROPTOSIS REPAIR    . BREAST EXCISIONAL BIOPSY Right 02/06/2013  . BREAST LUMPECTOMY  08/2011   left  . BREAST LUMPECTOMY WITH NEEDLE LOCALIZATION Right 02/06/2013   Procedure: RIGHT BREAST LUMPECTOMY WITH NEEDLE LOCALIZATION;  Surgeon: Harl Bowie, MD;  Location: [facility];  Service: General;  Laterality: Right;  . COLONOSCOPY W/ POLYPECTOMY    . COLONOSCOPY WITH PROPOFOL N/A 10/27/2015   Procedure: COLONOSCOPY WITH PROPOFOL;  Surgeon: Mauri Pole, MD;  Location:  [facility];  Service: Endoscopy;  Laterality: N/A;  . HARDWARE REMOVAL Left 04/20/2015   Procedure: HARDWARE REMOVAL LEFT LUMBAR FIVE SCREW;  Surgeon: Karie Chimera, MD;  Location: [facility];  Service: Neurosurgery;  Laterality: Left;  . JOINT REPLACEMENT Bilateral    bilateral knee  . POLYPECTOMY  1990's  . THYROID CYST EXCISION  1994  . TOTAL KNEE ARTHROPLASTY  2006   bilateral  . TRANSPHENOIDAL / TRANSNASAL HYPOPHYSECTOMY / RESECTION PITUITARY TUMOR  6/11    SOCIAL HISTORY: Social History   Tobacco Use  . Smoking status: Never Smoker  . Smokeless tobacco: Never Used  Substance Use Topics  . Alcohol use: No  . Drug use: No    FAMILY HISTORY: Family History  Problem Relation Age of Onset  . Heart disease Father   . Clotting disorder Father   . Stroke Father   . Hypertension Father   . Heart Problems Father   . Stroke Mother   . Diabetes Mother   . Hypertension Mother   . Hyperlipidemia Mother   . Obesity Mother   . Cancer Sister 30       Breast Cancer  . Breast cancer Sister 54  . Colon cancer Neg Hx   . Neuropathy Neg Hx     ROS: Review of Systems  Gastrointestinal: Negative for nausea and vomiting.  Musculoskeletal:       Negative for muscle weakness.    PHYSICAL EXAM: Pt in no acute distress  RECENT LABS AND TESTS: BMET    Component Value Date/Time   NA 140 09/12/2018 0916   NA 140 06/01/2016 1143   K 4.2 09/12/2018 0916   K 4.0 06/01/2016 1143   CL 103  09/12/2018 0916   CL 105 12/09/2012 1423   CO2 23 09/12/2018 0916   CO2 26 06/01/2016 1143   GLUCOSE 96 09/12/2018 0916   GLUCOSE 126 08/30/2017 0736   GLUCOSE 121 06/01/2016 1143   GLUCOSE 177 (H) 12/09/2012 1423   BUN 17 09/12/2018 0916   BUN 12.5 06/01/2016 1143   CREATININE 1.23 (H) 09/12/2018 0916   CREATININE 1.17 (H) 08/30/2017 0736   CREATININE 1.1 06/01/2016 1143   CALCIUM 10.3 09/12/2018 0916   CALCIUM 10.1 06/01/2016 1143   GFRNONAA 43 (L) 09/12/2018 0916   GFRNONAA 45 (L) 08/30/2017 0736   GFRAA 50 (L) 09/12/2018 0916   GFRAA 52 (L) 08/30/2017 0736   Lab Results  Component Value Date   HGBA1C 6.1 (H) 09/12/2018   HGBA1C 6.1 (H) 03/28/2018   HGBA1C 6.8 (H) 05/10/2017   HGBA1C 6.2 (H) 12/14/2016   HGBA1C 6.3 (H) 08/31/2016   Lab Results  Component Value Date   INSULIN 19.7 09/12/2018   INSULIN 20.7 03/28/2018   INSULIN 10.5 05/10/2017   INSULIN 20.9 12/14/2016   INSULIN 23.5 08/31/2016   CBC    Component Value Date/Time   WBC 7.1 08/30/2017 0736   WBC 14.5 (H) 08/21/2016 2027   RBC 4.05 08/30/2017 0736   HGB 10.7 (L) 08/30/2017 0736   HGB 11.0 (L) 05/10/2017 1006   HGB 11.1 (L) 06/01/2016 1143   HCT 32.7 (L) 08/30/2017 0736   HCT 34.4 05/10/2017 1006   HCT 34.9 06/01/2016 1143   PLT 243 08/30/2017 0736   PLT 248 06/01/2016 1143   MCV 80.7 08/30/2017 0736   MCV 80 05/10/2017 1006   MCV 81.2 06/01/2016 1143   MCH 26.3 08/30/2017 0736   MCHC 32.6 08/30/2017 0736   RDW 15.8 (H) 08/30/2017 0736  RDW 16.5 (H) 05/10/2017 1006   RDW 15.8 (H) 06/01/2016 1143   LYMPHSABS 1.8 08/30/2017 0736   LYMPHSABS 1.3 05/10/2017 1006   LYMPHSABS 1.8 06/01/2016 1143   MONOABS 0.5 08/30/2017 0736   MONOABS 0.6 06/01/2016 1143   EOSABS 0.1 08/30/2017 0736   EOSABS 0.1 05/10/2017 1006   BASOSABS 0.0 08/30/2017 0736   BASOSABS 0.0 05/10/2017 1006   BASOSABS 0.0 06/01/2016 1143   Iron/TIBC/Ferritin/ %Sat    Component Value Date/Time   IRON 55 12/14/2016 1146    TIBC 329 12/14/2016 1146   FERRITIN 479 (H) 12/14/2016 1146   FERRITIN 362 (H) 04/15/2015 1058   IRONPCTSAT 17 12/14/2016 1146   Lipid Panel     Component Value Date/Time   CHOL 229 (H) 09/12/2018 0916   TRIG 127 09/12/2018 0916   HDL 43 09/12/2018 0916   CHOLHDL 6.4 11/11/2010 0555   VLDL 17 11/11/2010 0555   LDLCALC 161 (H) 09/12/2018 0916   Hepatic Function Panel     Component Value Date/Time   PROT 7.1 09/12/2018 0916   PROT 7.6 06/01/2016 1143   ALBUMIN 4.1 09/12/2018 0916   ALBUMIN 3.2 (L) 06/01/2016 1143   AST 13 09/12/2018 0916   AST 10 08/30/2017 0736   AST 11 06/01/2016 1143   ALT 13 09/12/2018 0916   ALT 9 08/30/2017 0736   ALT 12 06/01/2016 1143   ALKPHOS 73 09/12/2018 0916   ALKPHOS 69 06/01/2016 1143   BILITOT 0.3 09/12/2018 0916   BILITOT 0.4 08/30/2017 0736   BILITOT 0.28 06/01/2016 1143    Results for YSABELA, KEISLER (MRN [id number]) as of 01/23/2019 07:42  Ref. Range 09/12/2018 09:16  Vitamin D, 25-Hydroxy Latest Ref Range: 30.0 - 100.0 ng/mL 51.7     I, Marcille Blanco, CMA, am acting as transcriptionist for [name], PA-C I, [name], PA-C have reviewed above note and agree with its content

## 2019-02-10 ENCOUNTER — Encounter (INDEPENDENT_AMBULATORY_CARE_PROVIDER_SITE_OTHER): Payer: Self-pay | Admitting: Physician Assistant

## 2019-02-10 ENCOUNTER — Telehealth (INDEPENDENT_AMBULATORY_CARE_PROVIDER_SITE_OTHER): Payer: Medicare Other | Admitting: Physician Assistant

## 2019-02-10 ENCOUNTER — Other Ambulatory Visit: Payer: Self-pay

## 2019-02-10 DIAGNOSIS — E1140 Type 2 diabetes mellitus with diabetic neuropathy, unspecified: Secondary | ICD-10-CM

## 2019-02-10 DIAGNOSIS — E559 Vitamin D deficiency, unspecified: Secondary | ICD-10-CM | POA: Diagnosis not present

## 2019-02-10 DIAGNOSIS — I10 Essential (primary) hypertension: Secondary | ICD-10-CM

## 2019-02-10 DIAGNOSIS — Z6841 Body Mass Index (BMI) 40.0 and over, adult: Secondary | ICD-10-CM

## 2019-02-10 MED ORDER — METFORMIN HCL 500 MG PO TABS: ORAL_TABLET | ORAL | 0 refills | Status: DC

## 2019-02-10 MED ORDER — VITAMIN D (ERGOCALCIFEROL) 1.25 MG (50000 UNIT) PO CAPS: 50000.0000 [IU] | ORAL_CAPSULE | ORAL | 0 refills | Status: DC

## 2019-02-10 MED ORDER — HYDROCHLOROTHIAZIDE 25 MG PO TABS: 25.0000 mg | ORAL_TABLET | Freq: Every day | ORAL | 0 refills | Status: DC

## 2019-02-10 NOTE — Progress Notes (Signed)
Office: [phone]  /  Fax: [phone] TeleHealth Visit:  [name] has verbally consented to this TeleHealth visit today. The patient is located at home, the provider is located at the [facility] office. The participants in this visit include the listed provider and patient. The visit was conducted today via telephone call.  HPI:   Chief Complaint: OBESITY [name] is here to discuss her progress with her obesity treatment plan. She is on the Category 2 plan and is following her eating plan approximately 50% of the time. She states she is exercising 0 minutes 0 times per week. [name] reports her most recent weight to be 280 lbs. She reports that her sister was just diagnosed with COVID-19 and is in the ICU. [name] states she is skipping breakfast and not getting enough protein. She is getting tired of cooking meat the same way. We were unable to weigh the patient today for this TeleHealth visit. She feels as if she has gained weight since her last visit. She has lost 10 lbs since starting treatment with [name].  Vitamin D deficiency [name] has a diagnosis of Vitamin D deficiency. She is currently taking prescription Vit D weekly and denies nausea, vomiting or muscle weakness.  Diabetes II with Peripheral Neuropathy [name] has a diagnosis of diabetes type II. [name] is not checking blood sugars. Last A1c was 6.1 on 09/12/2018. She has been working on intensive lifestyle modifications including diet, exercise, and weight loss to help control her blood glucose levels.  Hypertension [name] is a 73 y.o. female with hypertension and is on HCTZ and Avapro.  [name] denies chest pain or headache. She is working weight loss to help control her blood pressure with the goal of decreasing her risk of heart attack and stroke.  ASSESSMENT AND PLAN:  Vitamin D deficiency - Plan: Vitamin D, Ergocalciferol, (DRISDOL) 1.25 MG (50000 UT) CAPS capsule  Type 2 diabetes  mellitus with diabetic neuropathy, without long-term current use of insulin (HCC)  Essential hypertension - Plan: hydrochlorothiazide (HYDRODIURIL) 25 MG tablet  Class 3 severe obesity with serious comorbidity and body mass index (BMI) of 40.0 to 44.9 in adult, unspecified obesity type ([place])  Type 2 diabetes mellitus without complication, without long-term current use of insulin ([place]) - Plan: metFORMIN (GLUCOPHAGE) 500 MG tablet  PLAN:  Vitamin D Deficiency [name] was informed that low Vitamin D levels contributes to fatigue and are associated with obesity, breast, and colon cancer. She agrees to continue to take prescription Vit D @ 50,000 IU every week #4 with 0 refills and will follow-up for routine testing of Vitamin D, at least 2-3 times per year. She was informed of the risk of over-replacement of Vitamin D and agrees to not increase her dose unless she discusses this with [name] first. [name] agrees to follow-up with our clinic in 2 weeks.  Diabetes II with Peripheral Neuropathy [name] has been given extensive diabetes education by myself today including ideal fasting and post-prandial blood glucose readings, individual ideal HgA1c goals  and hypoglycemia prevention. We discussed the importance of good blood sugar control to decrease the likelihood of diabetic complications such as nephropathy, neuropathy, limb loss, blindness, coronary artery disease, and death. We discussed the importance of intensive lifestyle modification including diet, exercise and weight loss as the first line treatment for diabetes. [name] was given a refill on her metformin #30 with 0 refills and agrees to follow-up with our clinic in 2 weeks.  Hypertension We discussed sodium restriction,  working on healthy weight loss, and a regular exercise program as the means to achieve improved blood pressure control. [name] agreed with this plan and agreed to follow up as directed. We will continue to monitor her blood pressure as well  as her progress with the above lifestyle modifications. [name] was given a refill on her HCTZ #30 with 0 refills and agrees to follow-up with our clinic in 2 weeks. She will watch for signs of hypotension as she continues her lifestyle modifications.  Obesity [name] is currently in the action stage of change. As such, her goal is to continue with weight loss efforts. She has agreed to follow the Category 2 plan. [name] has been instructed to work up to a goal of 150 minutes of combined cardio and strengthening exercise per week for weight loss and overall health benefits. We discussed the following Behavioral Modification Strategies today: no skipping meals, work on meal planning and easy cooking plans.  [name] has agreed to follow-up with our clinic in 2 weeks. She was informed of the importance of frequent follow-up visits to maximize her success with intensive lifestyle modifications for her multiple health conditions.  ALLERGIES: Allergies  Allergen Reactions   Cymbalta [Duloxetine Hcl] Other (See Comments)    Stomach pain   Gabapentin Itching   Lipitor [Atorvastatin] Other (See Comments)    Cramps   Lisinopril Swelling   Maxzide [Hydrochlorothiazide W-Triamterene] Other (See Comments)    Cramping    Pravastatin Sodium Other (See Comments)    Headache    Simvastatin Other (See Comments)    Memory loss   Lodine [Etodolac] Itching    MEDICATIONS: Current Outpatient Medications on File Prior to Visit  Medication Sig Dispense Refill   amitriptyline (ELAVIL) 25 MG tablet Take 25 mg by mouth every morning.      aspirin 81 MG tablet Take 81 mg by mouth daily.     atorvastatin (LIPITOR) 20 MG tablet Take 20 mg by mouth once a week.     blood glucose meter kit and supplies KIT Dispense based on patient and insurance preference. Use up to four times daily as directed. (FOR ICD-9 250.00, 250.01). 1 each 0   DUREZOL 0.05 % EMUL Place 1 drop into the left eye daily.  0    escitalopram (LEXAPRO) 10 MG tablet Take 10 mg by mouth daily.     ferrous sulfate 325 (65 FE) MG tablet Take 1 tablet (325 mg total) by mouth daily with breakfast. 30 tablet 0   fluticasone (CUTIVATE) 0.05 % cream Apply 1 application topically 2 (two) times daily.  0   hydrOXYzine (ATARAX/VISTARIL) 10 MG tablet Take 10 mg by mouth 3 (three) times daily as needed.     irbesartan (AVAPRO) 300 MG tablet Take 1 tablet (300 mg total) by mouth at bedtime. 30 tablet 0   ketoconazole (NIZORAL) 2 % cream Apply 1 application topically daily. 15 g 0   niacin 500 MG CR capsule Take 500 mg by mouth 2 (two) times daily with a meal.     Omega-3 Fatty Acids (FISH OIL) 1200 MG CAPS Take 500 mg by mouth daily.      tiZANidine (ZANAFLEX) 2 MG tablet Take 2 mg by mouth at bedtime.     verapamil (VERELAN PM) 240 MG 24 hr capsule Take 240 mg by mouth daily.      Vitamin D, Cholecalciferol, 400 UNITS TABS Take 400 Units by mouth at bedtime.     No current facility-administered medications on file  prior to visit.     PAST MEDICAL HISTORY: Past Medical History:  Diagnosis Date   Anemia    Anxiety    takes Xanax daily   Arthritis    Back pain    Blood transfusion    Breast cancer ([place]) 2013   left breast   Bronchitis    hx of   Chronic fatigue syndrome    Depression    takes Lexapro daily   Diabetes mellitus without complication (HCC)    boderline   Diverticulosis    Edema    feet and legs   Headache(784.0)    History of colon polyps 1998   adenomatous   Hyperlipidemia    takes Niacin daily   Hypertension    takes Verapamil and Avapro daily   Joint pain    OSA (obstructive sleep apnea)    Personal history of radiation therapy    Pneumonia 4/12   Albuterol daily as needed   Radiation 10/29/11-11/24/11   left breast 6100 cGy   Shortness of breath    with exertion   Spinal stenosis    Tinnitus     PAST SURGICAL HISTORY: Past Surgical History:  Procedure  Laterality Date   APPENDECTOMY  1978   BACK SURGERY     BELPHAROPTOSIS REPAIR     BREAST EXCISIONAL BIOPSY Right 02/06/2013   BREAST LUMPECTOMY  08/2011   left   BREAST LUMPECTOMY WITH NEEDLE LOCALIZATION Right 02/06/2013   Procedure: RIGHT BREAST LUMPECTOMY WITH NEEDLE LOCALIZATION;  Surgeon: Harl Bowie, MD;  Location: [facility];  Service: General;  Laterality: Right;   COLONOSCOPY W/ POLYPECTOMY     COLONOSCOPY WITH PROPOFOL N/A 10/27/2015   Procedure: COLONOSCOPY WITH PROPOFOL;  Surgeon: Mauri Pole, MD;  Location: [facility];  Service: Endoscopy;  Laterality: N/A;   HARDWARE REMOVAL Left 04/20/2015   Procedure: HARDWARE REMOVAL LEFT LUMBAR FIVE SCREW;  Surgeon: Karie Chimera, MD;  Location: [facility];  Service: Neurosurgery;  Laterality: Left;   JOINT REPLACEMENT Bilateral    bilateral knee   POLYPECTOMY  1990's   THYROID CYST EXCISION  1994   TOTAL KNEE ARTHROPLASTY  2006   bilateral   TRANSPHENOIDAL / TRANSNASAL HYPOPHYSECTOMY / RESECTION PITUITARY TUMOR  6/11    SOCIAL HISTORY: Social History   Tobacco Use   Smoking status: Never Smoker   Smokeless tobacco: Never Used  Substance Use Topics   Alcohol use: No   Drug use: No    FAMILY HISTORY: Family History  Problem Relation Age of Onset   Heart disease Father    Clotting disorder Father    Stroke Father    Hypertension Father    Heart Problems Father    Stroke Mother    Diabetes Mother    Hypertension Mother    Hyperlipidemia Mother    Obesity Mother    Cancer Sister 64       Breast Cancer   Breast cancer Sister 19   Colon cancer Neg Hx    Neuropathy Neg Hx    ROS: Review of Systems  Cardiovascular: Negative for chest pain.  Gastrointestinal: Negative for nausea and vomiting.  Musculoskeletal:       Negative for muscle weakness.  Neurological: Negative for headaches.   PHYSICAL EXAM: Pt in no acute distress  RECENT LABS AND TESTS: BMET    Component Value  Date/Time   NA 140 09/12/2018 0916   NA 140 06/01/2016 1143   K 4.2 09/12/2018 0916   K 4.0 06/01/2016  1143   CL 103 09/12/2018 0916   CL 105 12/09/2012 1423   CO2 23 09/12/2018 0916   CO2 26 06/01/2016 1143   GLUCOSE 96 09/12/2018 0916   GLUCOSE 126 08/30/2017 0736   GLUCOSE 121 06/01/2016 1143   GLUCOSE 177 (H) 12/09/2012 1423   BUN 17 09/12/2018 0916   BUN 12.5 06/01/2016 1143   CREATININE 1.23 (H) 09/12/2018 0916   CREATININE 1.17 (H) 08/30/2017 0736   CREATININE 1.1 06/01/2016 1143   CALCIUM 10.3 09/12/2018 0916   CALCIUM 10.1 06/01/2016 1143   GFRNONAA 43 (L) 09/12/2018 0916   GFRNONAA 45 (L) 08/30/2017 0736   GFRAA 50 (L) 09/12/2018 0916   GFRAA 52 (L) 08/30/2017 0736   Lab Results  Component Value Date   HGBA1C 6.1 (H) 09/12/2018   HGBA1C 6.1 (H) 03/28/2018   HGBA1C 6.8 (H) 05/10/2017   HGBA1C 6.2 (H) 12/14/2016   HGBA1C 6.3 (H) 08/31/2016   Lab Results  Component Value Date   INSULIN 19.7 09/12/2018   INSULIN 20.7 03/28/2018   INSULIN 10.5 05/10/2017   INSULIN 20.9 12/14/2016   INSULIN 23.5 08/31/2016   CBC    Component Value Date/Time   WBC 7.1 08/30/2017 0736   WBC 14.5 (H) 08/21/2016 2027   RBC 4.05 08/30/2017 0736   HGB 10.7 (L) 08/30/2017 0736   HGB 11.0 (L) 05/10/2017 1006   HGB 11.1 (L) 06/01/2016 1143   HCT 32.7 (L) 08/30/2017 0736   HCT 34.4 05/10/2017 1006   HCT 34.9 06/01/2016 1143   PLT 243 08/30/2017 0736   PLT 248 06/01/2016 1143   MCV 80.7 08/30/2017 0736   MCV 80 05/10/2017 1006   MCV 81.2 06/01/2016 1143   MCH 26.3 08/30/2017 0736   MCHC 32.6 08/30/2017 0736   RDW 15.8 (H) 08/30/2017 0736   RDW 16.5 (H) 05/10/2017 1006   RDW 15.8 (H) 06/01/2016 1143   LYMPHSABS 1.8 08/30/2017 0736   LYMPHSABS 1.3 05/10/2017 1006   LYMPHSABS 1.8 06/01/2016 1143   MONOABS 0.5 08/30/2017 0736   MONOABS 0.6 06/01/2016 1143   EOSABS 0.1 08/30/2017 0736   EOSABS 0.1 05/10/2017 1006   BASOSABS 0.0 08/30/2017 0736   BASOSABS 0.0 05/10/2017 1006     BASOSABS 0.0 06/01/2016 1143   Iron/TIBC/Ferritin/ %Sat    Component Value Date/Time   IRON 55 12/14/2016 1146   TIBC 329 12/14/2016 1146   FERRITIN 479 (H) 12/14/2016 1146   FERRITIN 362 (H) 04/15/2015 1058   IRONPCTSAT 17 12/14/2016 1146   Lipid Panel     Component Value Date/Time   CHOL 229 (H) 09/12/2018 0916   TRIG 127 09/12/2018 0916   HDL 43 09/12/2018 0916   CHOLHDL 6.4 11/11/2010 0555   VLDL 17 11/11/2010 0555   LDLCALC 161 (H) 09/12/2018 0916   Hepatic Function Panel     Component Value Date/Time   PROT 7.1 09/12/2018 0916   PROT 7.6 06/01/2016 1143   ALBUMIN 4.1 09/12/2018 0916   ALBUMIN 3.2 (L) 06/01/2016 1143   AST 13 09/12/2018 0916   AST 10 08/30/2017 0736   AST 11 06/01/2016 1143   ALT 13 09/12/2018 0916   ALT 9 08/30/2017 0736   ALT 12 06/01/2016 1143   ALKPHOS 73 09/12/2018 0916   ALKPHOS 69 06/01/2016 1143   BILITOT 0.3 09/12/2018 0916   BILITOT 0.4 08/30/2017 0736   BILITOT 0.28 06/01/2016 1143    Results for MALAKA, RUFFNER (MRN [id number]) as of 02/10/2019 15:17  Ref. Range 09/12/2018 09:16  Vitamin  D, 25-Hydroxy Latest Ref Range: 30.0 - 100.0 ng/mL 51.7   I, [name], am acting as Location manager for [employer], PA-C I, [name], PA-C have reviewed above note and agree with its content

## 2019-03-03 ENCOUNTER — Encounter (INDEPENDENT_AMBULATORY_CARE_PROVIDER_SITE_OTHER): Payer: Self-pay | Admitting: Physician Assistant

## 2019-03-03 ENCOUNTER — Other Ambulatory Visit: Payer: Self-pay

## 2019-03-03 ENCOUNTER — Telehealth (INDEPENDENT_AMBULATORY_CARE_PROVIDER_SITE_OTHER): Payer: Medicare Other | Admitting: Physician Assistant

## 2019-03-03 DIAGNOSIS — E7849 Other hyperlipidemia: Secondary | ICD-10-CM | POA: Diagnosis not present

## 2019-03-03 DIAGNOSIS — E559 Vitamin D deficiency, unspecified: Secondary | ICD-10-CM | POA: Diagnosis not present

## 2019-03-03 DIAGNOSIS — Z6841 Body Mass Index (BMI) 40.0 and over, adult: Secondary | ICD-10-CM

## 2019-03-03 MED ORDER — VITAMIN D (ERGOCALCIFEROL) 1.25 MG (50000 UNIT) PO CAPS: 50000.0000 [IU] | ORAL_CAPSULE | ORAL | 0 refills | Status: DC

## 2019-03-04 NOTE — Progress Notes (Signed)
Office: [phone]  /  Fax: [phone] TeleHealth Visit:  [name] has verbally consented to this TeleHealth visit today. The patient is located at home, the provider is located at the [facility] office. The participants in this visit include the listed provider and patient. The visit was conducted today via telephone call.  HPI:   Chief Complaint: OBESITY [name] is here to discuss her progress with her obesity treatment plan. She is on the Category 2 plan and is following her eating plan approximately 60% of the time. She states she is exercising 0 minutes 0 times per week. [name] reports that her sister recently passed due to [place]. She is not eating much throughout the day as her appetite has decreased. We were unable to weigh the patient today for this TeleHealth visit. She is unsure if she has lost or gained weight as she recently lost her sister to [place]. She has lost 10 lbs since starting treatment with [name].  Vitamin D deficiency [name] has a diagnosis of Vitamin D deficiency. She is currently taking prescription Vit D and denies nausea, vomiting or muscle weakness.  Hyperlipidemia [name] has hyperlipidemia and has been trying to improve her cholesterol levels with intensive lifestyle modification including a low saturated fat diet, exercise and weight loss. She is on atorvastatin and denies any chest pain.   ASSESSMENT AND PLAN:  Vitamin D deficiency - Plan: Vitamin D, Ergocalciferol, (DRISDOL) 1.25 MG (50000 UT) CAPS capsule  Other hyperlipidemia  Class 3 severe obesity with serious comorbidity and body mass index (BMI) of 40.0 to 44.9 in adult, unspecified obesity type ([name])  PLAN:  Vitamin D Deficiency [name] was informed that low Vitamin D levels contributes to fatigue and are associated with obesity, breast, and colon cancer. She agrees to continue to take prescription Vit D @ 50,000 IU every week #4 with 0 refills and will follow-up for routine  testing of Vitamin D, at least 2-3 times per year. She was informed of the risk of over-replacement of Vitamin D and agrees to not increase her dose unless she discusses this with [name] first. [name] agrees to follow-up with our clinic in 2 weeks.  Hyperlipidemia [name] was informed of the American Heart Association Guidelines emphasizing intensive lifestyle modifications as the first line treatment for hyperlipidemia. We discussed many lifestyle modifications today in depth, and Zanaria will continue to work on decreasing saturated fats such as fatty red meat, butter and many fried foods. She will continue atorvastatin, increase vegetables and lean protein in her diet, and continue to work on exercise and weight loss efforts.  Obesity [name] is currently in the action stage of change. As such, her goal is to continue with weight loss efforts. She has agreed to follow the Category 2 plan. [name] has been instructed to work up to a goal of 150 minutes of combined cardio and strengthening exercise per week for weight loss and overall health benefits. We discussed the following Behavioral Modification Strategies today: no skipping meals, work on meal planning and easy cooking plans.  [name] has agreed to follow-up with our clinic in 2 weeks. She was informed of the importance of frequent follow-up visits to maximize her success with intensive lifestyle modifications for her multiple health conditions.  ALLERGIES: Allergies  Allergen Reactions   Cymbalta [Duloxetine Hcl] Other (See Comments)    Stomach pain   Gabapentin Itching   Lipitor [Atorvastatin] Other (See Comments)    Cramps   Lisinopril Swelling   Maxzide [Hydrochlorothiazide  W-Triamterene] Other (See Comments)    Cramping    Pravastatin Sodium Other (See Comments)    Headache    Simvastatin Other (See Comments)    Memory loss   Lodine [Etodolac] Itching    MEDICATIONS: Current Outpatient Medications on File Prior to Visit    Medication Sig Dispense Refill   amitriptyline (ELAVIL) 25 MG tablet Take 25 mg by mouth every morning.      aspirin 81 MG tablet Take 81 mg by mouth daily.     atorvastatin (LIPITOR) 20 MG tablet Take 20 mg by mouth once a week.     blood glucose meter kit and supplies KIT Dispense based on patient and insurance preference. Use up to four times daily as directed. (FOR ICD-9 250.00, 250.01). 1 each 0   DUREZOL 0.05 % EMUL Place 1 drop into the left eye daily.  0   escitalopram (LEXAPRO) 10 MG tablet Take 10 mg by mouth daily.     ferrous sulfate 325 (65 FE) MG tablet Take 1 tablet (325 mg total) by mouth daily with breakfast. 30 tablet 0   fluticasone (CUTIVATE) 0.05 % cream Apply 1 application topically 2 (two) times daily.  0   hydrochlorothiazide (HYDRODIURIL) 25 MG tablet Take 1 tablet (25 mg total) by mouth daily. 30 tablet 0   hydrOXYzine (ATARAX/VISTARIL) 10 MG tablet Take 10 mg by mouth 3 (three) times daily as needed.     irbesartan (AVAPRO) 300 MG tablet Take 1 tablet (300 mg total) by mouth at bedtime. 30 tablet 0   ketoconazole (NIZORAL) 2 % cream Apply 1 application topically daily. 15 g 0   metFORMIN (GLUCOPHAGE) 500 MG tablet take 1 tablet by mouth once daily with BREAKFAST 30 tablet 0   niacin 500 MG CR capsule Take 500 mg by mouth 2 (two) times daily with a meal.     Omega-3 Fatty Acids (FISH OIL) 1200 MG CAPS Take 500 mg by mouth daily.      tiZANidine (ZANAFLEX) 2 MG tablet Take 2 mg by mouth at bedtime.     verapamil (VERELAN PM) 240 MG 24 hr capsule Take 240 mg by mouth daily.      Vitamin D, Cholecalciferol, 400 UNITS TABS Take 400 Units by mouth at bedtime.     No current facility-administered medications on file prior to visit.     PAST MEDICAL HISTORY: Past Medical History:  Diagnosis Date   Anemia    Anxiety    takes Xanax daily   Arthritis    Back pain    Blood transfusion    Breast cancer ([place]) 2013   left breast   Bronchitis     hx of   Chronic fatigue syndrome    Depression    takes Lexapro daily   Diabetes mellitus without complication (HCC)    boderline   Diverticulosis    Edema    feet and legs   Headache(784.0)    History of colon polyps 1998   adenomatous   Hyperlipidemia    takes Niacin daily   Hypertension    takes Verapamil and Avapro daily   Joint pain    OSA (obstructive sleep apnea)    Personal history of radiation therapy    Pneumonia 4/12   Albuterol daily as needed   Radiation 10/29/11-11/24/11   left breast 6100 cGy   Shortness of breath    with exertion   Spinal stenosis    Tinnitus     PAST SURGICAL HISTORY: Past  Surgical History:  Procedure Laterality Date   APPENDECTOMY  1978   BACK SURGERY     BELPHAROPTOSIS REPAIR     BREAST EXCISIONAL BIOPSY Right 02/06/2013   BREAST LUMPECTOMY  08/2011   left   BREAST LUMPECTOMY WITH NEEDLE LOCALIZATION Right 02/06/2013   Procedure: RIGHT BREAST LUMPECTOMY WITH NEEDLE LOCALIZATION;  Surgeon: Harl Bowie, [name];  Location: [facility];  Service: General;  Laterality: Right;   COLONOSCOPY W/ POLYPECTOMY     COLONOSCOPY WITH PROPOFOL N/A 10/27/2015   Procedure: COLONOSCOPY WITH PROPOFOL;  Surgeon: Mauri Pole, [name];  Location: [facility];  Service: Endoscopy;  Laterality: N/A;   HARDWARE REMOVAL Left 04/20/2015   Procedure: HARDWARE REMOVAL LEFT LUMBAR FIVE SCREW;  Surgeon: Karie Chimera, [name];  Location: [facility];  Service: Neurosurgery;  Laterality: Left;   JOINT REPLACEMENT Bilateral    bilateral knee   POLYPECTOMY  1990's   THYROID CYST EXCISION  1994   TOTAL KNEE ARTHROPLASTY  2006   bilateral   TRANSPHENOIDAL / TRANSNASAL HYPOPHYSECTOMY / RESECTION PITUITARY TUMOR  6/11    SOCIAL HISTORY: Social History   Tobacco Use   Smoking status: Never Smoker   Smokeless tobacco: Never Used  Substance Use Topics   Alcohol use: No   Drug use: No    FAMILY HISTORY: Family History  Problem Relation  Age of Onset   Heart disease Father    Clotting disorder Father    Stroke Father    Hypertension Father    Heart Problems Father    Stroke Mother    Diabetes Mother    Hypertension Mother    Hyperlipidemia Mother    Obesity Mother    Cancer Sister 80       Breast Cancer   Breast cancer Sister 43   Colon cancer Neg Hx    Neuropathy Neg Hx    ROS: Review of Systems  Cardiovascular: Negative for chest pain.  Gastrointestinal: Negative for nausea and vomiting.  Musculoskeletal:       Negative for muscle weakness.   PHYSICAL EXAM: Pt in no acute distress  RECENT LABS AND TESTS: BMET    Component Value Date/Time   NA 140 09/12/2018 0916   NA 140 06/01/2016 1143   K 4.2 09/12/2018 0916   K 4.0 06/01/2016 1143   CL 103 09/12/2018 0916   CL 105 12/09/2012 1423   CO2 23 09/12/2018 0916   CO2 26 06/01/2016 1143   GLUCOSE 96 09/12/2018 0916   GLUCOSE 126 08/30/2017 0736   GLUCOSE 121 06/01/2016 1143   GLUCOSE 177 (H) 12/09/2012 1423   BUN 17 09/12/2018 0916   BUN 12.5 06/01/2016 1143   CREATININE 1.23 (H) 09/12/2018 0916   CREATININE 1.17 (H) 08/30/2017 0736   CREATININE 1.1 06/01/2016 1143   CALCIUM 10.3 09/12/2018 0916   CALCIUM 10.1 06/01/2016 1143   GFRNONAA 43 (L) 09/12/2018 0916   GFRNONAA 45 (L) 08/30/2017 0736   GFRAA 50 (L) 09/12/2018 0916   GFRAA 52 (L) 08/30/2017 0736   Lab Results  Component Value Date   HGBA1C 6.1 (H) 09/12/2018   HGBA1C 6.1 (H) 03/28/2018   HGBA1C 6.8 (H) 05/10/2017   HGBA1C 6.2 (H) 12/14/2016   HGBA1C 6.3 (H) 08/31/2016   Lab Results  Component Value Date   INSULIN 19.7 09/12/2018   INSULIN 20.7 03/28/2018   INSULIN 10.5 05/10/2017   INSULIN 20.9 12/14/2016   INSULIN 23.5 08/31/2016   CBC    Component Value Date/Time   WBC 7.1  08/30/2017 0736   WBC 14.5 (H) 08/21/2016 2027   RBC 4.05 08/30/2017 0736   HGB 10.7 (L) 08/30/2017 0736   HGB 11.0 (L) 05/10/2017 1006   HGB 11.1 (L) 06/01/2016 1143   HCT 32.7  (L) 08/30/2017 0736   HCT 34.4 05/10/2017 1006   HCT 34.9 06/01/2016 1143   PLT 243 08/30/2017 0736   PLT 248 06/01/2016 1143   MCV 80.7 08/30/2017 0736   MCV 80 05/10/2017 1006   MCV 81.2 06/01/2016 1143   MCH 26.3 08/30/2017 0736   MCHC 32.6 08/30/2017 0736   RDW 15.8 (H) 08/30/2017 0736   RDW 16.5 (H) 05/10/2017 1006   RDW 15.8 (H) 06/01/2016 1143   LYMPHSABS 1.8 08/30/2017 0736   LYMPHSABS 1.3 05/10/2017 1006   LYMPHSABS 1.8 06/01/2016 1143   MONOABS 0.5 08/30/2017 0736   MONOABS 0.6 06/01/2016 1143   EOSABS 0.1 08/30/2017 0736   EOSABS 0.1 05/10/2017 1006   BASOSABS 0.0 08/30/2017 0736   BASOSABS 0.0 05/10/2017 1006   BASOSABS 0.0 06/01/2016 1143   Iron/TIBC/Ferritin/ %Sat    Component Value Date/Time   IRON 55 12/14/2016 1146   TIBC 329 12/14/2016 1146   FERRITIN 479 (H) 12/14/2016 1146   FERRITIN 362 (H) 04/15/2015 1058   IRONPCTSAT 17 12/14/2016 1146   Lipid Panel     Component Value Date/Time   CHOL 229 (H) 09/12/2018 0916   TRIG 127 09/12/2018 0916   HDL 43 09/12/2018 0916   CHOLHDL 6.4 11/11/2010 0555   VLDL 17 11/11/2010 0555   LDLCALC 161 (H) 09/12/2018 0916   Hepatic Function Panel     Component Value Date/Time   PROT 7.1 09/12/2018 0916   PROT 7.6 06/01/2016 1143   ALBUMIN 4.1 09/12/2018 0916   ALBUMIN 3.2 (L) 06/01/2016 1143   AST 13 09/12/2018 0916   AST 10 08/30/2017 0736   AST 11 06/01/2016 1143   ALT 13 09/12/2018 0916   ALT 9 08/30/2017 0736   ALT 12 06/01/2016 1143   ALKPHOS 73 09/12/2018 0916   ALKPHOS 69 06/01/2016 1143   BILITOT 0.3 09/12/2018 0916   BILITOT 0.4 08/30/2017 0736   BILITOT 0.28 06/01/2016 1143    Results for LAURANNE, BEYERSDORF (MRN [id number]) as of 03/04/2019 08:24  Ref. Range 09/12/2018 09:16  Vitamin D, 25-Hydroxy Latest Ref Range: 30.0 - 100.0 ng/mL 51.7   I, [name], am acting as Location manager for [employer], PA-C I, [name] Potash, PA-C have reviewed above note and agree with its content

## 2019-03-19 ENCOUNTER — Telehealth (INDEPENDENT_AMBULATORY_CARE_PROVIDER_SITE_OTHER): Payer: Self-pay | Admitting: Physician Assistant

## 2019-03-19 DIAGNOSIS — E1140 Type 2 diabetes mellitus with diabetic neuropathy, unspecified: Secondary | ICD-10-CM

## 2019-03-19 MED ORDER — METFORMIN HCL 500 MG PO TABS: ORAL_TABLET | ORAL | 0 refills | Status: DC

## 2019-03-19 NOTE — Telephone Encounter (Signed)
MetFormin and VitD refill, please call this into [facility] on [address] at [place].  Please let patient know when this is sent to pharmacy.

## 2019-03-24 ENCOUNTER — Other Ambulatory Visit: Payer: Self-pay

## 2019-03-24 ENCOUNTER — Telehealth (INDEPENDENT_AMBULATORY_CARE_PROVIDER_SITE_OTHER): Payer: Medicare Other | Admitting: Physician Assistant

## 2019-03-24 ENCOUNTER — Encounter (INDEPENDENT_AMBULATORY_CARE_PROVIDER_SITE_OTHER): Payer: Self-pay | Admitting: Physician Assistant

## 2019-03-24 DIAGNOSIS — E66813 Obesity, class 3: Secondary | ICD-10-CM

## 2019-03-24 DIAGNOSIS — E1169 Type 2 diabetes mellitus with other specified complication: Secondary | ICD-10-CM

## 2019-03-24 DIAGNOSIS — Z6841 Body Mass Index (BMI) 40.0 and over, adult: Secondary | ICD-10-CM

## 2019-03-24 DIAGNOSIS — E559 Vitamin D deficiency, unspecified: Secondary | ICD-10-CM

## 2019-03-24 DIAGNOSIS — E785 Hyperlipidemia, unspecified: Secondary | ICD-10-CM

## 2019-03-24 MED ORDER — VITAMIN D (ERGOCALCIFEROL) 1.25 MG (50000 UNIT) PO CAPS: 50000.0000 [IU] | ORAL_CAPSULE | ORAL | 0 refills | Status: DC

## 2019-03-24 NOTE — Progress Notes (Signed)
Office: [phone]  /  Fax: [phone] TeleHealth Visit:  [name] has verbally consented to this TeleHealth visit today. The patient is located at home, the provider is located at the [facility] office. The participants in this visit include the listed provider and patient. The visit was conducted today via telephone call.  HPI:   Chief Complaint: OBESITY [name] is here to discuss her progress with her obesity treatment plan. She is on the [place] eating plan and is following her eating plan approximately 60-70% of the time. She states she is exercising 0 minutes 0 times per week. [name] reports her most recent weight to be 285 lbs. She reports that she is tired of meat and can't seem to eat all of it on the plan. She just got an air fryer and is hoping it will help make her food choices more interesting. We were unable to weigh the patient today for this TeleHealth visit. She feels as if she has maintained her weight since her last visit. She has lost 10 lbs since starting treatment with [name].  Vitamin D deficiency Jezreel has a diagnosis of Vitamin D deficiency. She is currently taking prescription Vit D and denies nausea, vomiting or muscle weakness.  Hyperlipidemia with Diabetes Adamae has hyperlipidemia and has been trying to improve her cholesterol levels with intensive lifestyle modification including a low saturated fat diet, exercise and weight loss. She is on atorvastatin and denies any chest pain.   ASSESSMENT AND PLAN:  Vitamin D deficiency - Plan: Vitamin D, Ergocalciferol, (DRISDOL) 1.25 MG (50000 UT) CAPS capsule, DISCONTINUED: Vitamin D, Ergocalciferol, (DRISDOL) 1.25 MG (50000 UT) CAPS capsule  Class 3 severe obesity with serious comorbidity and body mass index (BMI) of 40.0 to 44.9 in adult, unspecified obesity type (HCC)  Hyperlipidemia associated with type 2 diabetes mellitus ([place])  PLAN:  Vitamin D Deficiency [name] was informed that low Vitamin  D levels contributes to fatigue and are associated with obesity, breast, and colon cancer. She agrees to continue to take prescription Vit D @ 50,000 IU every week #4 with 0 refills and will follow-up for routine testing of Vitamin D, at least 2-3 times per year. She was informed of the risk of over-replacement of Vitamin D and agrees to not increase her dose unless she discusses this with [name] first. She agrees to follow-up with our clinic in 2 weeks.  Hyperlipidemia with Diabetes [name] was informed of the American Heart Association Guidelines emphasizing intensive lifestyle modifications as the first line treatment for hyperlipidemia. We discussed many lifestyle modifications today in depth, and [name] will continue to work on decreasing saturated fats such as fatty red meat, butter and many fried foods. She will continue atorvastatin, increase vegetables and lean protein in her diet, and continue to work on exercise and weight loss efforts.  Obesity [name] is currently in the action stage of change. As such, her goal is to continue with weight loss efforts. She has agreed to follow the Category 2 plan. [name] has been instructed to work up to a goal of 150 minutes of combined cardio and strengthening exercise per week for weight loss and overall health benefits. We discussed the following Behavioral Modification Strategies today: increasing lean protein intake and no skipping meals.  [name] has agreed to follow-up with our clinic in 2 weeks. She was informed of the importance of frequent follow-up visits to maximize her success with intensive lifestyle modifications for her multiple health conditions.  ALLERGIES: Allergies  Allergen  Reactions   Cymbalta [Duloxetine Hcl] Other (See Comments)    Stomach pain   Gabapentin Itching   Lipitor [Atorvastatin] Other (See Comments)    Cramps   Lisinopril Swelling   Maxzide [Hydrochlorothiazide W-Triamterene] Other (See Comments)    Cramping     Pravastatin Sodium Other (See Comments)    Headache    Simvastatin Other (See Comments)    Memory loss   Lodine [Etodolac] Itching    MEDICATIONS: Current Outpatient Medications on File Prior to Visit  Medication Sig Dispense Refill   amitriptyline (ELAVIL) 25 MG tablet Take 25 mg by mouth every morning.      aspirin 81 MG tablet Take 81 mg by mouth daily.     atorvastatin (LIPITOR) 20 MG tablet Take 20 mg by mouth once a week.     blood glucose meter kit and supplies KIT Dispense based on patient and insurance preference. Use up to four times daily as directed. (FOR ICD-9 250.00, 250.01). 1 each 0   DUREZOL 0.05 % EMUL Place 1 drop into the left eye daily.  0   escitalopram (LEXAPRO) 10 MG tablet Take 10 mg by mouth daily.     ferrous sulfate 325 (65 FE) MG tablet Take 1 tablet (325 mg total) by mouth daily with breakfast. 30 tablet 0   fluticasone (CUTIVATE) 0.05 % cream Apply 1 application topically 2 (two) times daily.  0   hydrochlorothiazide (HYDRODIURIL) 25 MG tablet Take 1 tablet (25 mg total) by mouth daily. 30 tablet 0   hydrOXYzine (ATARAX/VISTARIL) 10 MG tablet Take 10 mg by mouth 3 (three) times daily as needed.     irbesartan (AVAPRO) 300 MG tablet Take 1 tablet (300 mg total) by mouth at bedtime. 30 tablet 0   ketoconazole (NIZORAL) 2 % cream Apply 1 application topically daily. 15 g 0   metFORMIN (GLUCOPHAGE) 500 MG tablet take 1 tablet by mouth once daily with BREAKFAST 30 tablet 0   niacin 500 MG CR capsule Take 500 mg by mouth 2 (two) times daily with a meal.     Omega-3 Fatty Acids (FISH OIL) 1200 MG CAPS Take 500 mg by mouth daily.      tiZANidine (ZANAFLEX) 2 MG tablet Take 2 mg by mouth at bedtime.     verapamil (VERELAN PM) 240 MG 24 hr capsule Take 240 mg by mouth daily.      Vitamin D, Cholecalciferol, 400 UNITS TABS Take 400 Units by mouth at bedtime.     No current facility-administered medications on file prior to visit.     PAST  MEDICAL HISTORY: Past Medical History:  Diagnosis Date   Anemia    Anxiety    takes Xanax daily   Arthritis    Back pain    Blood transfusion    Breast cancer ([name]) 2013   left breast   Bronchitis    hx of   Chronic fatigue syndrome    Depression    takes Lexapro daily   Diabetes mellitus without complication (HCC)    boderline   Diverticulosis    Edema    feet and legs   Headache(784.0)    History of colon polyps 1998   adenomatous   Hyperlipidemia    takes Niacin daily   Hypertension    takes Verapamil and Avapro daily   Joint pain    OSA (obstructive sleep apnea)    Personal history of radiation therapy    Pneumonia 4/12   Albuterol daily as needed  Radiation 10/29/11-11/24/11   left breast 6100 cGy   Shortness of breath    with exertion   Spinal stenosis    Tinnitus     PAST SURGICAL HISTORY: Past Surgical History:  Procedure Laterality Date   APPENDECTOMY  1978   BACK SURGERY     BELPHAROPTOSIS REPAIR     BREAST EXCISIONAL BIOPSY Right 02/06/2013   BREAST LUMPECTOMY  08/2011   left   BREAST LUMPECTOMY WITH NEEDLE LOCALIZATION Right 02/06/2013   Procedure: RIGHT BREAST LUMPECTOMY WITH NEEDLE LOCALIZATION;  Surgeon: Harl Bowie, MD;  Location: [facility];  Service: General;  Laterality: Right;   COLONOSCOPY W/ POLYPECTOMY     COLONOSCOPY WITH PROPOFOL N/A 10/27/2015   Procedure: COLONOSCOPY WITH PROPOFOL;  Surgeon: Mauri Pole, MD;  Location: [facility];  Service: Endoscopy;  Laterality: N/A;   HARDWARE REMOVAL Left 04/20/2015   Procedure: HARDWARE REMOVAL LEFT LUMBAR FIVE SCREW;  Surgeon: Karie Chimera, MD;  Location: [facility];  Service: Neurosurgery;  Laterality: Left;   JOINT REPLACEMENT Bilateral    bilateral knee   POLYPECTOMY  1990's   THYROID CYST EXCISION  1994   TOTAL KNEE ARTHROPLASTY  2006   bilateral   TRANSPHENOIDAL / TRANSNASAL HYPOPHYSECTOMY / RESECTION PITUITARY TUMOR  6/11    SOCIAL  HISTORY: Social History   Tobacco Use   Smoking status: Never Smoker   Smokeless tobacco: Never Used  Substance Use Topics   Alcohol use: No   Drug use: No    FAMILY HISTORY: Family History  Problem Relation Age of Onset   Heart disease Father    Clotting disorder Father    Stroke Father    Hypertension Father    Heart Problems Father    Stroke Mother    Diabetes Mother    Hypertension Mother    Hyperlipidemia Mother    Obesity Mother    Cancer Sister 46       Breast Cancer   Breast cancer Sister 53   Colon cancer Neg Hx    Neuropathy Neg Hx    ROS: Review of Systems  Cardiovascular: Negative for chest pain.  Gastrointestinal: Negative for nausea and vomiting.  Musculoskeletal:       Negative for muscle weakness.   PHYSICAL EXAM: Pt in no acute distress  RECENT LABS AND TESTS: BMET    Component Value Date/Time   NA 140 09/12/2018 0916   NA 140 06/01/2016 1143   K 4.2 09/12/2018 0916   K 4.0 06/01/2016 1143   CL 103 09/12/2018 0916   CL 105 12/09/2012 1423   CO2 23 09/12/2018 0916   CO2 26 06/01/2016 1143   GLUCOSE 96 09/12/2018 0916   GLUCOSE 126 08/30/2017 0736   GLUCOSE 121 06/01/2016 1143   GLUCOSE 177 (H) 12/09/2012 1423   BUN 17 09/12/2018 0916   BUN 12.5 06/01/2016 1143   CREATININE 1.23 (H) 09/12/2018 0916   CREATININE 1.17 (H) 08/30/2017 0736   CREATININE 1.1 06/01/2016 1143   CALCIUM 10.3 09/12/2018 0916   CALCIUM 10.1 06/01/2016 1143   GFRNONAA 43 (L) 09/12/2018 0916   GFRNONAA 45 (L) 08/30/2017 0736   GFRAA 50 (L) 09/12/2018 0916   GFRAA 52 (L) 08/30/2017 0736   Lab Results  Component Value Date   HGBA1C 6.1 (H) 09/12/2018   HGBA1C 6.1 (H) 03/28/2018   HGBA1C 6.8 (H) 05/10/2017   HGBA1C 6.2 (H) 12/14/2016   HGBA1C 6.3 (H) 08/31/2016   Lab Results  Component Value Date   INSULIN 19.7  09/12/2018   INSULIN 20.7 03/28/2018   INSULIN 10.5 05/10/2017   INSULIN 20.9 12/14/2016   INSULIN 23.5 08/31/2016   CBC     Component Value Date/Time   WBC 7.1 08/30/2017 0736   WBC 14.5 (H) 08/21/2016 2027   RBC 4.05 08/30/2017 0736   HGB 10.7 (L) 08/30/2017 0736   HGB 11.0 (L) 05/10/2017 1006   HGB 11.1 (L) 06/01/2016 1143   HCT 32.7 (L) 08/30/2017 0736   HCT 34.4 05/10/2017 1006   HCT 34.9 06/01/2016 1143   PLT 243 08/30/2017 0736   PLT 248 06/01/2016 1143   MCV 80.7 08/30/2017 0736   MCV 80 05/10/2017 1006   MCV 81.2 06/01/2016 1143   MCH 26.3 08/30/2017 0736   MCHC 32.6 08/30/2017 0736   RDW 15.8 (H) 08/30/2017 0736   RDW 16.5 (H) 05/10/2017 1006   RDW 15.8 (H) 06/01/2016 1143   LYMPHSABS 1.8 08/30/2017 0736   LYMPHSABS 1.3 05/10/2017 1006   LYMPHSABS 1.8 06/01/2016 1143   MONOABS 0.5 08/30/2017 0736   MONOABS 0.6 06/01/2016 1143   EOSABS 0.1 08/30/2017 0736   EOSABS 0.1 05/10/2017 1006   BASOSABS 0.0 08/30/2017 0736   BASOSABS 0.0 05/10/2017 1006   BASOSABS 0.0 06/01/2016 1143   Iron/TIBC/Ferritin/ %Sat    Component Value Date/Time   IRON 55 12/14/2016 1146   TIBC 329 12/14/2016 1146   FERRITIN 479 (H) 12/14/2016 1146   FERRITIN 362 (H) 04/15/2015 1058   IRONPCTSAT 17 12/14/2016 1146   Lipid Panel     Component Value Date/Time   CHOL 229 (H) 09/12/2018 0916   TRIG 127 09/12/2018 0916   HDL 43 09/12/2018 0916   CHOLHDL 6.4 11/11/2010 0555   VLDL 17 11/11/2010 0555   LDLCALC 161 (H) 09/12/2018 0916   Hepatic Function Panel     Component Value Date/Time   PROT 7.1 09/12/2018 0916   PROT 7.6 06/01/2016 1143   ALBUMIN 4.1 09/12/2018 0916   ALBUMIN 3.2 (L) 06/01/2016 1143   AST 13 09/12/2018 0916   AST 10 08/30/2017 0736   AST 11 06/01/2016 1143   ALT 13 09/12/2018 0916   ALT 9 08/30/2017 0736   ALT 12 06/01/2016 1143   ALKPHOS 73 09/12/2018 0916   ALKPHOS 69 06/01/2016 1143   BILITOT 0.3 09/12/2018 0916   BILITOT 0.4 08/30/2017 0736   BILITOT 0.28 06/01/2016 1143    Results for LATRESHA, YAHR (MRN [id number]) as of 03/24/2019 16:44  Ref. Range 09/12/2018 09:16   Vitamin D, 25-Hydroxy Latest Ref Range: 30.0 - 100.0 ng/mL 51.7   I, [name], am acting as Location manager for [employer], PA-C I, [name], PA-C have reviewed above note and agree with its content

## 2019-03-25 ENCOUNTER — Encounter: Payer: Self-pay | Admitting: Pulmonary Disease

## 2019-03-25 ENCOUNTER — Ambulatory Visit (INDEPENDENT_AMBULATORY_CARE_PROVIDER_SITE_OTHER): Payer: Medicare Other | Admitting: Pulmonary Disease

## 2019-03-25 ENCOUNTER — Other Ambulatory Visit: Payer: Self-pay

## 2019-03-25 VITALS — BP 130/66 | HR 75 | Ht 66.0 in | Wt 284.8 lb

## 2019-03-25 DIAGNOSIS — Z9989 Dependence on other enabling machines and devices: Secondary | ICD-10-CM

## 2019-03-25 DIAGNOSIS — G478 Other sleep disorders: Secondary | ICD-10-CM

## 2019-03-25 DIAGNOSIS — Z7189 Other specified counseling: Secondary | ICD-10-CM | POA: Diagnosis not present

## 2019-03-25 DIAGNOSIS — G4733 Obstructive sleep apnea (adult) (pediatric): Secondary | ICD-10-CM | POA: Diagnosis not present

## 2019-03-25 DIAGNOSIS — E669 Obesity, unspecified: Secondary | ICD-10-CM

## 2019-03-25 NOTE — Progress Notes (Signed)
[facility]  Chief Complaint  Patient presents with  . sleep consult    last seen 2016, needs to re-establish for new supplies.  pt wears cpap nightly, denies any current complaints.      Constitutional:  BP 130/66 (BP Location: Left Arm, Cuff Size: Normal)   Pulse 75   Ht '5\' 6"'  (1.676 m)   Wt 284 lb 12.8 oz (129.2 kg)   SpO2 97%   BMI 45.97 kg/m   Past Medical History:  HLD, HTN, HA, Anemia, Spinal stenosis, Breast cancer 2013, Depression  Brief Summary:  [name] is a 73 y.o. female with obstructive sleep apnea.  Last saw her in 2016.  She has same machine for more than 5 years.  Not delivering pressure as well.  Needs new supplies.  Uses [facility] ([facility]  She goes to sleep at 10 pm.  She falls asleep in 15 minutes.  She wakes up 2 times to use the bathroom.  She gets out of bed at 830 am.  She feels okay in the morning.  She denies morning headache.  She does not use anything to help her fall sleep or stay awake.  Sometimes she doesn't put her CPAP on when she goes to the bathroom.  She can then wake up later feeling like she is paralyzed even though she is awake.  This last about 30 seconds to 1 minute.  She denies sleep walking, sleep talking, bruxism, or nightmares.  There is no history of restless legs.  She denies sleep hallucinations, or cataplexy.  The Epworth score is 8 out of 24.  Her sister passed away from [place] last month while living in [place].  She is still trying to cope with this loss.    Physical Exam:   Appearance - well kempt   ENMT - clear nasal mucosa, midline nasal  septum, no oral exudates, no LAN, trachea midline, MP 4  Respiratory - normal chest wall, normal respiratory effort, no accessory muscle use, no wheeze/rales  CV - s1s2 regular rate and rhythm, no murmurs, no peripheral edema, radial pulses symmetric  GI - soft, non tender, no masses  Lymph - no adenopathy noted  in neck and axillary areas  MSK - normal gait  Ext - no cyanosis, clubbing, or joint inflammation noted  Skin - no rashes, lesions, or ulcers  Neuro - normal strength, oriented x 3  Psych - normal mood and affect   Assessment/Plan:   Obstructive sleep apnea. - she reports compliance to CPAP and benefit from therapy - her machine is more than 75 yrs old, broke and can't be repaired - will arrange for new auto CPAP machine with new mask and supplies - explained she might need home sleep study for insurance coverage prior to getting new machine  Sleep paralysis. - likely related to REM related arousals with sleep apnea when she doesn't put her CPAP back on  Obesity. - discussed importance of weight loss  Advice given about COVID 19 infection. - discussed methods to minimize her risk of exposure   Patient Instructions  Will arrange for new CPAP machine, mask and supplies  Follow up in 2 months    Chesley Mires, MD [facility] Pager: [phone] 03/25/2019, 11:02 AM  Flow Sheet    Sleep tests:  PSG 08/05/04 >> AHI 139  Review of Systems:  Reviewed and negative  Medications:   Allergies as of 03/25/2019  Reactions   Cymbalta [duloxetine Hcl] Other (See Comments)   Stomach pain   Gabapentin Itching   Lipitor [atorvastatin] Other (See Comments)   Cramps   Lisinopril Swelling   Maxzide [hydrochlorothiazide W-triamterene] Other (See Comments)   Cramping   Pravastatin Sodium Other (See Comments)   Headache   Simvastatin Other (See Comments)   Memory loss   Lodine [etodolac] Itching      Medication List       Accurate as of March 25, 2019 11:02 AM. If you have any questions, ask your nurse or doctor.        amitriptyline 25 MG tablet Commonly known as: ELAVIL Take 25 mg by mouth every morning.   aspirin 81 MG tablet Take 81 mg by mouth daily.   atorvastatin 20 MG tablet Commonly known as: LIPITOR Take 20 mg by mouth once a  week.   blood glucose meter kit and supplies Kit Dispense based on patient and insurance preference. Use up to four times daily as directed. (FOR ICD-9 250.00, 250.01).   Durezol 0.05 % Emul Generic drug: Difluprednate Place 1 drop into the left eye daily.   escitalopram 10 MG tablet Commonly known as: LEXAPRO Take 10 mg by mouth daily.   ferrous sulfate 325 (65 FE) MG tablet Take 1 tablet (325 mg total) by mouth daily with breakfast.   Fish Oil 1200 MG Caps Take 500 mg by mouth daily.   fluticasone 0.05 % cream Commonly known as: CUTIVATE Apply 1 application topically 2 (two) times daily.   hydrochlorothiazide 25 MG tablet Commonly known as: HYDRODIURIL Take 1 tablet (25 mg total) by mouth daily.   hydrOXYzine 10 MG tablet Commonly known as: ATARAX/VISTARIL Take 10 mg by mouth 3 (three) times daily as needed.   irbesartan 300 MG tablet Commonly known as: AVAPRO Take 1 tablet (300 mg total) by mouth at bedtime.   ketoconazole 2 % cream Commonly known as: NIZORAL Apply 1 application topically daily.   metFORMIN 500 MG tablet Commonly known as: GLUCOPHAGE take 1 tablet by mouth once daily with BREAKFAST   niacin 500 MG CR capsule Take 500 mg by mouth 2 (two) times daily with a meal.   tiZANidine 2 MG tablet Commonly known as: ZANAFLEX Take 2 mg by mouth at bedtime.   verapamil 240 MG 24 hr capsule Commonly known as: VERELAN PM Take 240 mg by mouth daily.   Vitamin D (Cholecalciferol) 10 MCG (400 UNIT) Tabs Take 400 Units by mouth at bedtime.   Vitamin D (Ergocalciferol) 1.25 MG (50000 UT) Caps capsule Commonly known as: DRISDOL Take 1 capsule (50,000 Units total) by mouth every 7 (seven) days.       Past Surgical History:  She  has a past surgical history that includes Total knee arthroplasty (2006); Polypectomy (1990's); Appendectomy (1978); Thyroid cyst excision (1994); Transphenoidal / transnasal hypophysectomy / resection pituitary tumor (6/11);  Blepharoptosis repair; Joint replacement (Bilateral); Colonoscopy w/ polypectomy; Breast lumpectomy with needle localization (Right, 02/06/2013); Hardware Removal (Left, 04/20/2015); Colonoscopy with propofol (N/A, 10/27/2015); Back surgery; Breast lumpectomy (08/2011); and Breast excisional biopsy (Right, 02/06/2013).  Family History:  Her family history includes Breast cancer (age of onset: 31) in her sister; Cancer (age of onset: 72) in her sister; Clotting disorder in her father; Diabetes in her mother; Heart Problems in her father; Heart disease in her father; Hyperlipidemia in her mother; Hypertension in her father and mother; Obesity in her mother; Stroke in her father and mother.  Social History:  She  reports that she has never smoked. She has never used smokeless tobacco. She reports that she does not drink alcohol or use drugs.

## 2019-03-25 NOTE — Patient Instructions (Signed)
Will arrange for new CPAP machine, mask and supplies  Follow up in 2 months

## 2019-04-14 ENCOUNTER — Telehealth (INDEPENDENT_AMBULATORY_CARE_PROVIDER_SITE_OTHER): Payer: Medicare Other | Admitting: Physician Assistant

## 2019-04-14 ENCOUNTER — Other Ambulatory Visit: Payer: Self-pay

## 2019-04-14 ENCOUNTER — Encounter (INDEPENDENT_AMBULATORY_CARE_PROVIDER_SITE_OTHER): Payer: Self-pay | Admitting: Physician Assistant

## 2019-04-14 DIAGNOSIS — E559 Vitamin D deficiency, unspecified: Secondary | ICD-10-CM

## 2019-04-14 DIAGNOSIS — Z6841 Body Mass Index (BMI) 40.0 and over, adult: Secondary | ICD-10-CM

## 2019-04-14 DIAGNOSIS — E1140 Type 2 diabetes mellitus with diabetic neuropathy, unspecified: Secondary | ICD-10-CM | POA: Diagnosis not present

## 2019-04-14 MED ORDER — VITAMIN D (ERGOCALCIFEROL) 1.25 MG (50000 UNIT) PO CAPS: 50000.0000 [IU] | ORAL_CAPSULE | ORAL | 0 refills | Status: DC

## 2019-04-14 MED ORDER — METFORMIN HCL 500 MG PO TABS: ORAL_TABLET | ORAL | 0 refills | Status: DC

## 2019-04-15 NOTE — Progress Notes (Signed)
° °Office: [phone]  /  Fax: [phone] °TeleHealth Visit:  °Albertia Casler has verbally consented to this TeleHealth visit today. The patient is located at home, the provider is located at the [facility] office. The participants in this visit include the listed provider and patient and any and all parties involved. The visit was conducted today via telephone. °Suanne was unable to use realtime audiovisual technology today and the telehealth visit was conducted via telephone. ° °HPI:  ° °Chief Complaint: OBESITY °Shada is here to discuss her progress with her obesity treatment plan. She is on the portion control better and make smarter food choices plan and she is following her eating plan approximately 80 % of the time. She states she is exercising 0 minutes 0 times per week. °Voncille reports that she is doing a better job with getting her protein in, although she is tired of chicken. She is having a lot of back pain and she is going back to see her orthopedist. °We were unable to weigh the patient today for this TeleHealth visit. She feels as if she has lost weight (weight not reported) since her last visit. She has lost 10 lbs since starting treatment with us. ° °Pre-Diabetes °Makalya has a diagnosis of prediabetes based on her elevated Hgb A1c and was informed this puts her at greater risk of developing diabetes. She is taking metformin currently and continues to work on diet and exercise to decrease risk of diabetes. She denies nausea, vomiting or diarrhea. ° °Vitamin D deficiency °Archer has a diagnosis of vitamin D deficiency. She is currently taking vit D and denies nausea, vomiting or muscle weakness. ° °ASSESSMENT AND PLAN: ° °Class 3 severe obesity with serious comorbidity and body mass index (BMI) of 40.0 to 44.9 in adult, unspecified obesity type (HCC) ° °Vitamin D deficiency - Plan: Vitamin D, Ergocalciferol, (DRISDOL) 1.25 MG (50000 UT) CAPS capsule ° °Type 2 diabetes mellitus with  diabetic neuropathy, without long-term current use of insulin (HCC) - Plan: metFORMIN (GLUCOPHAGE) 500 MG tablet ° °PLAN: ° °Pre-Diabetes °Louella will continue to work on weight loss, exercise, and decreasing simple carbohydrates in her diet to help decrease the risk of diabetes. We dicussed metformin including benefits and risks. She was informed that eating too many simple carbohydrates or too many calories at one sitting increases the likelihood of GI side effects. Abraham agrees to continue metformin 500 mg daily #30 with no refills and follow up with us as directed to monitor her progress. ° °Vitamin D Deficiency °Kannon was informed that low vitamin D levels contributes to fatigue and are associated with obesity, breast, and colon cancer. Aika agrees to continue to take prescription Vit D @50,000 IU every week #4 with no refills and she will follow up for routine testing of vitamin D, at least 2-3 times per year. She was informed of the risk of over-replacement of vitamin D and agrees to not increase her dose unless she discusses this with us first. Teshara agrees to follow up with our clinic in 2 weeks. ° °Obesity °Amarah is currently in the action stage of change. °As such, her goal is to continue with weight loss efforts °She has agreed to portion control better and make smarter food choices, such as increase vegetables and decrease simple carbohydrates  °Rozelia has been instructed to work up to a goal of 150 minutes of combined cardio and strengthening exercise per week for weight loss and overall health benefits. °We discussed the following Behavioral   Behavioral Modification Strategies today: keeping healthy foods in the home and work on meal planning and easy cooking plans  Pegi has agreed to follow up with our clinic in 2 weeks. She was informed of the importance of frequent follow up visits to maximize her success with intensive lifestyle modifications for her multiple health conditions.  ALLERGIES: Allergies    Allergen Reactions   Cymbalta [Duloxetine Hcl] Other (See Comments)    Stomach pain   Gabapentin Itching   Lipitor [Atorvastatin] Other (See Comments)    Cramps   Lisinopril Swelling   Maxzide [Hydrochlorothiazide W-Triamterene] Other (See Comments)    Cramping    Pravastatin Sodium Other (See Comments)    Headache    Simvastatin Other (See Comments)    Memory loss   Lodine [Etodolac] Itching    MEDICATIONS: Current Outpatient Medications on File Prior to Visit  Medication Sig Dispense Refill   amitriptyline (ELAVIL) 25 MG tablet Take 25 mg by mouth every morning.      aspirin 81 MG tablet Take 81 mg by mouth daily.     atorvastatin (LIPITOR) 20 MG tablet Take 20 mg by mouth once a week.     blood glucose meter kit and supplies KIT Dispense based on patient and insurance preference. Use up to four times daily as directed. (FOR ICD-9 250.00, 250.01). 1 each 0   DUREZOL 0.05 % EMUL Place 1 drop into the left eye daily.  0   escitalopram (LEXAPRO) 10 MG tablet Take 10 mg by mouth daily.     ferrous sulfate 325 (65 FE) MG tablet Take 1 tablet (325 mg total) by mouth daily with breakfast. 30 tablet 0   fluticasone (CUTIVATE) 0.05 % cream Apply 1 application topically 2 (two) times daily.  0   hydrochlorothiazide (HYDRODIURIL) 25 MG tablet Take 1 tablet (25 mg total) by mouth daily. 30 tablet 0   hydrOXYzine (ATARAX/VISTARIL) 10 MG tablet Take 10 mg by mouth 3 (three) times daily as needed.     irbesartan (AVAPRO) 300 MG tablet Take 1 tablet (300 mg total) by mouth at bedtime. 30 tablet 0   ketoconazole (NIZORAL) 2 % cream Apply 1 application topically daily. 15 g 0   niacin 500 MG CR capsule Take 500 mg by mouth 2 (two) times daily with a meal.     Omega-3 Fatty Acids (FISH OIL) 1200 MG CAPS Take 500 mg by mouth daily.      tiZANidine (ZANAFLEX) 2 MG tablet Take 2 mg by mouth at bedtime.     verapamil (VERELAN PM) 240 MG 24 hr capsule Take 240 mg by mouth  daily.      Vitamin D, Cholecalciferol, 400 UNITS TABS Take 400 Units by mouth at bedtime.     No current facility-administered medications on file prior to visit.     PAST MEDICAL HISTORY: Past Medical History:  Diagnosis Date   Anemia    Anxiety    takes Xanax daily   Arthritis    Back pain    Blood transfusion    Breast cancer ([place]) 2013   left breast   Bronchitis    hx of   Chronic fatigue syndrome    Depression    takes Lexapro daily   Diabetes mellitus without complication (HCC)    boderline   Diverticulosis    Edema    feet and legs   Headache(784.0)    History of colon polyps 1998   adenomatous   Hyperlipidemia    takes  Niacin daily   Hypertension    takes Verapamil and Avapro daily   Joint pain    OSA (obstructive sleep apnea)    Personal history of radiation therapy    Pneumonia 4/12   Albuterol daily as needed   Radiation 10/29/11-11/24/11   left breast 6100 cGy   Shortness of breath    with exertion   Spinal stenosis    Tinnitus     PAST SURGICAL HISTORY: Past Surgical History:  Procedure Laterality Date   APPENDECTOMY  1978   BACK SURGERY     BELPHAROPTOSIS REPAIR     BREAST EXCISIONAL BIOPSY Right 02/06/2013   BREAST LUMPECTOMY  08/2011   left   BREAST LUMPECTOMY WITH NEEDLE LOCALIZATION Right 02/06/2013   Procedure: RIGHT BREAST LUMPECTOMY WITH NEEDLE LOCALIZATION;  Surgeon: Harl Bowie, MD;  Location: [facility];  Service: General;  Laterality: Right;   COLONOSCOPY W/ POLYPECTOMY     COLONOSCOPY WITH PROPOFOL N/A 10/27/2015   Procedure: COLONOSCOPY WITH PROPOFOL;  Surgeon: Mauri Pole, MD;  Location: [facility];  Service: Endoscopy;  Laterality: N/A;   HARDWARE REMOVAL Left 04/20/2015   Procedure: HARDWARE REMOVAL LEFT LUMBAR FIVE SCREW;  Surgeon: Karie Chimera, MD;  Location: [facility];  Service: Neurosurgery;  Laterality: Left;   JOINT REPLACEMENT Bilateral    bilateral knee   POLYPECTOMY  1990's     THYROID CYST EXCISION  1994   TOTAL KNEE ARTHROPLASTY  2006   bilateral   TRANSPHENOIDAL / TRANSNASAL HYPOPHYSECTOMY / RESECTION PITUITARY TUMOR  6/11    SOCIAL HISTORY: Social History   Tobacco Use   Smoking status: Never Smoker   Smokeless tobacco: Never Used  Substance Use Topics   Alcohol use: No   Drug use: No    FAMILY HISTORY: Family History  Problem Relation Age of Onset   Heart disease Father    Clotting disorder Father    Stroke Father    Hypertension Father    Heart Problems Father    Stroke Mother    Diabetes Mother    Hypertension Mother    Hyperlipidemia Mother    Obesity Mother    Cancer Sister 76       Breast Cancer   Breast cancer Sister 52   Colon cancer Neg Hx    Neuropathy Neg Hx     ROS: Review of Systems  Constitutional: Positive for weight loss.  Gastrointestinal: Negative for diarrhea, nausea and vomiting.  Musculoskeletal:       Negative for muscle weakness    PHYSICAL EXAM: Pt in no acute distress  RECENT LABS AND TESTS: BMET    Component Value Date/Time   NA 140 09/12/2018 0916   NA 140 06/01/2016 1143   K 4.2 09/12/2018 0916   K 4.0 06/01/2016 1143   CL 103 09/12/2018 0916   CL 105 12/09/2012 1423   CO2 23 09/12/2018 0916   CO2 26 06/01/2016 1143   GLUCOSE 96 09/12/2018 0916   GLUCOSE 126 08/30/2017 0736   GLUCOSE 121 06/01/2016 1143   GLUCOSE 177 (H) 12/09/2012 1423   BUN 17 09/12/2018 0916   BUN 12.5 06/01/2016 1143   CREATININE 1.23 (H) 09/12/2018 0916   CREATININE 1.17 (H) 08/30/2017 0736   CREATININE 1.1 06/01/2016 1143   CALCIUM 10.3 09/12/2018 0916   CALCIUM 10.1 06/01/2016 1143   GFRNONAA 43 (L) 09/12/2018 0916   GFRNONAA 45 (L) 08/30/2017 0736   GFRAA 50 (L) 09/12/2018 0916   GFRAA 52 (L) 08/30/2017 8366  Lab Results  Component Value Date   HGBA1C 6.1 (H) 09/12/2018   HGBA1C 6.1 (H) 03/28/2018   HGBA1C 6.8 (H) 05/10/2017   HGBA1C 6.2 (H) 12/14/2016   HGBA1C 6.3 (H)  08/31/2016   Lab Results  Component Value Date   INSULIN 19.7 09/12/2018   INSULIN 20.7 03/28/2018   INSULIN 10.5 05/10/2017   INSULIN 20.9 12/14/2016   INSULIN 23.5 08/31/2016   CBC    Component Value Date/Time   WBC 7.1 08/30/2017 0736   WBC 14.5 (H) 08/21/2016 2027   RBC 4.05 08/30/2017 0736   HGB 10.7 (L) 08/30/2017 0736   HGB 11.0 (L) 05/10/2017 1006   HGB 11.1 (L) 06/01/2016 1143   HCT 32.7 (L) 08/30/2017 0736   HCT 34.4 05/10/2017 1006   HCT 34.9 06/01/2016 1143   PLT 243 08/30/2017 0736   PLT 248 06/01/2016 1143   MCV 80.7 08/30/2017 0736   MCV 80 05/10/2017 1006   MCV 81.2 06/01/2016 1143   MCH 26.3 08/30/2017 0736   MCHC 32.6 08/30/2017 0736   RDW 15.8 (H) 08/30/2017 0736   RDW 16.5 (H) 05/10/2017 1006   RDW 15.8 (H) 06/01/2016 1143   LYMPHSABS 1.8 08/30/2017 0736   LYMPHSABS 1.3 05/10/2017 1006   LYMPHSABS 1.8 06/01/2016 1143   MONOABS 0.5 08/30/2017 0736   MONOABS 0.6 06/01/2016 1143   EOSABS 0.1 08/30/2017 0736   EOSABS 0.1 05/10/2017 1006   BASOSABS 0.0 08/30/2017 0736   BASOSABS 0.0 05/10/2017 1006   BASOSABS 0.0 06/01/2016 1143   Iron/TIBC/Ferritin/ %Sat    Component Value Date/Time   IRON 55 12/14/2016 1146   TIBC 329 12/14/2016 1146   FERRITIN 479 (H) 12/14/2016 1146   FERRITIN 362 (H) 04/15/2015 1058   IRONPCTSAT 17 12/14/2016 1146   Lipid Panel     Component Value Date/Time   CHOL 229 (H) 09/12/2018 0916   TRIG 127 09/12/2018 0916   HDL 43 09/12/2018 0916   CHOLHDL 6.4 11/11/2010 0555   VLDL 17 11/11/2010 0555   LDLCALC 161 (H) 09/12/2018 0916   Hepatic Function Panel     Component Value Date/Time   PROT 7.1 09/12/2018 0916   PROT 7.6 06/01/2016 1143   ALBUMIN 4.1 09/12/2018 0916   ALBUMIN 3.2 (L) 06/01/2016 1143   AST 13 09/12/2018 0916   AST 10 08/30/2017 0736   AST 11 06/01/2016 1143   ALT 13 09/12/2018 0916   ALT 9 08/30/2017 0736   ALT 12 06/01/2016 1143   ALKPHOS 73 09/12/2018 0916   ALKPHOS 69 06/01/2016 1143    BILITOT 0.3 09/12/2018 0916   BILITOT 0.4 08/30/2017 0736   BILITOT 0.28 06/01/2016 1143      Ref. Range 09/12/2018 09:16  Vitamin D, 25-Hydroxy Latest Ref Range: 30.0 - 100.0 ng/mL 51.7    I, Doreene Nest, am acting as Location manager for [name], PA-C I, [name], PA-C have reviewed above note and agree with its content

## 2019-05-06 ENCOUNTER — Ambulatory Visit (INDEPENDENT_AMBULATORY_CARE_PROVIDER_SITE_OTHER): Payer: Medicare Other | Admitting: Physician Assistant

## 2019-05-06 ENCOUNTER — Encounter (INDEPENDENT_AMBULATORY_CARE_PROVIDER_SITE_OTHER): Payer: Self-pay | Admitting: Physician Assistant

## 2019-05-06 ENCOUNTER — Other Ambulatory Visit: Payer: Self-pay

## 2019-05-06 DIAGNOSIS — E1140 Type 2 diabetes mellitus with diabetic neuropathy, unspecified: Secondary | ICD-10-CM

## 2019-05-06 DIAGNOSIS — E559 Vitamin D deficiency, unspecified: Secondary | ICD-10-CM | POA: Diagnosis not present

## 2019-05-06 DIAGNOSIS — Z6841 Body Mass Index (BMI) 40.0 and over, adult: Secondary | ICD-10-CM | POA: Diagnosis not present

## 2019-05-06 MED ORDER — METFORMIN HCL 500 MG PO TABS: ORAL_TABLET | ORAL | 0 refills | Status: DC

## 2019-05-06 MED ORDER — VITAMIN D (ERGOCALCIFEROL) 1.25 MG (50000 UNIT) PO CAPS: 50000.0000 [IU] | ORAL_CAPSULE | ORAL | 0 refills | Status: DC

## 2019-05-07 NOTE — Progress Notes (Signed)
Office: [phone]  /  Fax: [phone] TeleHealth Visit:  [name] has verbally consented to this TeleHealth visit today. The patient is located at home, the provider is located at the [facility] office. The participants in this visit include the listed provider and patient. The visit was conducted today via telephone call.  HPI:   Chief Complaint: OBESITY [name] is here to discuss her progress with her obesity treatment plan. She is on the Pescatarian eating plan and is following her eating plan approximately 80% of the time. She states she is exercising 0 minutes 0 times per week. [name] reports that she has been eating a lot of chicken and not really following the Pescatarian plan.  We were unable to weigh the patient today for this TeleHealth visit. She feels as if she has lost 1 lb since her last visit. She has lost 10 lbs since starting treatment with [name].  Diabetes II with Peripheral Neuropathy [name] has a diagnosis of diabetes type II and is on metformin. [name] reports she has not been checking her blood sugars because she does not like pricking her finger. She has been working on intensive lifestyle modifications including diet, exercise, and weight loss to help control her blood glucose levels. No nausea, vomiting, or diarrhea.  Vitamin D deficiency [name] has a diagnosis of Vitamin D deficiency. She is currently taking prescription Vit D and denies nausea, vomiting or muscle weakness.  ASSESSMENT AND PLAN:  Type 2 diabetes mellitus with diabetic neuropathy, without long-term current use of insulin (HCC) - Plan: metFORMIN (GLUCOPHAGE) 500 MG tablet  Vitamin D deficiency - Plan: Vitamin D, Ergocalciferol, (DRISDOL) 1.25 MG (50000 UT) CAPS capsule  Class 3 severe obesity with serious comorbidity and body mass index (BMI) of 45.0 to 49.9 in adult, unspecified obesity type ([place])  PLAN:  Diabetes II with Peripheral Neuropathy [name] has been given extensive  diabetes education by myself today including ideal fasting and post-prandial blood glucose readings, individual ideal HgA1c goals  and hypoglycemia prevention. We discussed the importance of good blood sugar control to decrease the likelihood of diabetic complications such as nephropathy, neuropathy, limb loss, blindness, coronary artery disease, and death. We discussed the importance of intensive lifestyle modification including diet, exercise and weight loss as the first line treatment for diabetes. [name] was given a refill on her metformin #30 with 0 refills and agrees to follow-up with our clinic in 2 weeks.  Vitamin D Deficiency [name] was informed that low Vitamin D levels contributes to fatigue and are associated with obesity, breast, and colon cancer. She agrees to continue to take prescription Vit D @ 50,000 IU every week #4 with 0 refills and will follow-up for routine testing of Vitamin D, at least 2-3 times per year. She was informed of the risk of over-replacement of Vitamin D and agrees to not increase her dose unless she discusses this with [name] first. [name] agrees to follow-up with our clinic in 2 weeks.  Obesity [name] is currently in the action stage of change. As such, her goal is to continue with weight loss efforts. She has agreed to follow the Pescatarian eating plan. [name] has been instructed to work up to a goal of 150 minutes of combined cardio and strengthening exercise per week for weight loss and overall health benefits. We discussed the following Behavioral Modification Strategies today: work on meal planning and easy cooking plans, and keeping healthy foods in the home.  [name] has agreed to follow-up with our  clinic in 2 weeks. She was informed of the importance of frequent follow-up visits to maximize her success with intensive lifestyle modifications for her multiple health conditions.  ALLERGIES: Allergies  Allergen Reactions   Cymbalta [Duloxetine Hcl] Other (See  Comments)    Stomach pain   Gabapentin Itching   Lipitor [Atorvastatin] Other (See Comments)    Cramps   Lisinopril Swelling   Maxzide [Hydrochlorothiazide W-Triamterene] Other (See Comments)    Cramping    Pravastatin Sodium Other (See Comments)    Headache    Simvastatin Other (See Comments)    Memory loss   Lodine [Etodolac] Itching    MEDICATIONS: Current Outpatient Medications on File Prior to Visit  Medication Sig Dispense Refill   amitriptyline (ELAVIL) 25 MG tablet Take 25 mg by mouth every morning.      aspirin 81 MG tablet Take 81 mg by mouth daily.     atorvastatin (LIPITOR) 20 MG tablet Take 20 mg by mouth once a week.     blood glucose meter kit and supplies KIT Dispense based on patient and insurance preference. Use up to four times daily as directed. (FOR ICD-9 250.00, 250.01). 1 each 0   DUREZOL 0.05 % EMUL Place 1 drop into the left eye daily.  0   escitalopram (LEXAPRO) 10 MG tablet Take 10 mg by mouth daily.     ferrous sulfate 325 (65 FE) MG tablet Take 1 tablet (325 mg total) by mouth daily with breakfast. 30 tablet 0   fluticasone (CUTIVATE) 0.05 % cream Apply 1 application topically 2 (two) times daily.  0   hydrochlorothiazide (HYDRODIURIL) 25 MG tablet Take 1 tablet (25 mg total) by mouth daily. 30 tablet 0   hydrOXYzine (ATARAX/VISTARIL) 10 MG tablet Take 10 mg by mouth 3 (three) times daily as needed.     irbesartan (AVAPRO) 300 MG tablet Take 1 tablet (300 mg total) by mouth at bedtime. 30 tablet 0   ketoconazole (NIZORAL) 2 % cream Apply 1 application topically daily. 15 g 0   niacin 500 MG CR capsule Take 500 mg by mouth 2 (two) times daily with a meal.     Omega-3 Fatty Acids (FISH OIL) 1200 MG CAPS Take 500 mg by mouth daily.      tiZANidine (ZANAFLEX) 2 MG tablet Take 2 mg by mouth at bedtime.     verapamil (VERELAN PM) 240 MG 24 hr capsule Take 240 mg by mouth daily.      Vitamin D, Cholecalciferol, 400 UNITS TABS Take 400  Units by mouth at bedtime.     No current facility-administered medications on file prior to visit.     PAST MEDICAL HISTORY: Past Medical History:  Diagnosis Date   Anemia    Anxiety    takes Xanax daily   Arthritis    Back pain    Blood transfusion    Breast cancer ([place]) 2013   left breast   Bronchitis    hx of   Chronic fatigue syndrome    Depression    takes Lexapro daily   Diabetes mellitus without complication (HCC)    boderline   Diverticulosis    Edema    feet and legs   Headache(784.0)    History of colon polyps 1998   adenomatous   Hyperlipidemia    takes Niacin daily   Hypertension    takes Verapamil and Avapro daily   Joint pain    OSA (obstructive sleep apnea)    Personal history of  radiation therapy    Pneumonia 4/12   Albuterol daily as needed   Radiation 10/29/11-11/24/11   left breast 6100 cGy   Shortness of breath    with exertion   Spinal stenosis    Tinnitus     PAST SURGICAL HISTORY: Past Surgical History:  Procedure Laterality Date   APPENDECTOMY  1978   BACK SURGERY     BELPHAROPTOSIS REPAIR     BREAST EXCISIONAL BIOPSY Right 02/06/2013   BREAST LUMPECTOMY  08/2011   left   BREAST LUMPECTOMY WITH NEEDLE LOCALIZATION Right 02/06/2013   Procedure: RIGHT BREAST LUMPECTOMY WITH NEEDLE LOCALIZATION;  Surgeon: Harl Bowie, MD;  Location: [facility];  Service: General;  Laterality: Right;   COLONOSCOPY W/ POLYPECTOMY     COLONOSCOPY WITH PROPOFOL N/A 10/27/2015   Procedure: COLONOSCOPY WITH PROPOFOL;  Surgeon: Mauri Pole, MD;  Location: [facility];  Service: Endoscopy;  Laterality: N/A;   HARDWARE REMOVAL Left 04/20/2015   Procedure: HARDWARE REMOVAL LEFT LUMBAR FIVE SCREW;  Surgeon: Karie Chimera, MD;  Location: [facility];  Service: Neurosurgery;  Laterality: Left;   JOINT REPLACEMENT Bilateral    bilateral knee   POLYPECTOMY  1990's   THYROID CYST EXCISION  1994   TOTAL KNEE ARTHROPLASTY  2006     bilateral   TRANSPHENOIDAL / TRANSNASAL HYPOPHYSECTOMY / RESECTION PITUITARY TUMOR  6/11    SOCIAL HISTORY: Social History   Tobacco Use   Smoking status: Never Smoker   Smokeless tobacco: Never Used  Substance Use Topics   Alcohol use: No   Drug use: No    FAMILY HISTORY: Family History  Problem Relation Age of Onset   Heart disease Father    Clotting disorder Father    Stroke Father    Hypertension Father    Heart Problems Father    Stroke Mother    Diabetes Mother    Hypertension Mother    Hyperlipidemia Mother    Obesity Mother    Cancer Sister 15       Breast Cancer   Breast cancer Sister 98   Colon cancer Neg Hx    Neuropathy Neg Hx    ROS: Review of Systems  Gastrointestinal: Negative for diarrhea, nausea and vomiting.  Musculoskeletal:       Negative for muscle weakness.   PHYSICAL EXAM: Pt in no acute distress  RECENT LABS AND TESTS: BMET    Component Value Date/Time   NA 140 09/12/2018 0916   NA 140 06/01/2016 1143   K 4.2 09/12/2018 0916   K 4.0 06/01/2016 1143   CL 103 09/12/2018 0916   CL 105 12/09/2012 1423   CO2 23 09/12/2018 0916   CO2 26 06/01/2016 1143   GLUCOSE 96 09/12/2018 0916   GLUCOSE 126 08/30/2017 0736   GLUCOSE 121 06/01/2016 1143   GLUCOSE 177 (H) 12/09/2012 1423   BUN 17 09/12/2018 0916   BUN 12.5 06/01/2016 1143   CREATININE 1.23 (H) 09/12/2018 0916   CREATININE 1.17 (H) 08/30/2017 0736   CREATININE 1.1 06/01/2016 1143   CALCIUM 10.3 09/12/2018 0916   CALCIUM 10.1 06/01/2016 1143   GFRNONAA 43 (L) 09/12/2018 0916   GFRNONAA 45 (L) 08/30/2017 0736   GFRAA 50 (L) 09/12/2018 0916   GFRAA 52 (L) 08/30/2017 0736   Lab Results  Component Value Date   HGBA1C 6.1 (H) 09/12/2018   HGBA1C 6.1 (H) 03/28/2018   HGBA1C 6.8 (H) 05/10/2017   HGBA1C 6.2 (H) 12/14/2016   HGBA1C 6.3 (H) 08/31/2016  Lab Results  Component Value Date   INSULIN 19.7 09/12/2018   INSULIN 20.7 03/28/2018   INSULIN 10.5  05/10/2017   INSULIN 20.9 12/14/2016   INSULIN 23.5 08/31/2016   CBC    Component Value Date/Time   WBC 7.1 08/30/2017 0736   WBC 14.5 (H) 08/21/2016 2027   RBC 4.05 08/30/2017 0736   HGB 10.7 (L) 08/30/2017 0736   HGB 11.0 (L) 05/10/2017 1006   HGB 11.1 (L) 06/01/2016 1143   HCT 32.7 (L) 08/30/2017 0736   HCT 34.4 05/10/2017 1006   HCT 34.9 06/01/2016 1143   PLT 243 08/30/2017 0736   PLT 248 06/01/2016 1143   MCV 80.7 08/30/2017 0736   MCV 80 05/10/2017 1006   MCV 81.2 06/01/2016 1143   MCH 26.3 08/30/2017 0736   MCHC 32.6 08/30/2017 0736   RDW 15.8 (H) 08/30/2017 0736   RDW 16.5 (H) 05/10/2017 1006   RDW 15.8 (H) 06/01/2016 1143   LYMPHSABS 1.8 08/30/2017 0736   LYMPHSABS 1.3 05/10/2017 1006   LYMPHSABS 1.8 06/01/2016 1143   MONOABS 0.5 08/30/2017 0736   MONOABS 0.6 06/01/2016 1143   EOSABS 0.1 08/30/2017 0736   EOSABS 0.1 05/10/2017 1006   BASOSABS 0.0 08/30/2017 0736   BASOSABS 0.0 05/10/2017 1006   BASOSABS 0.0 06/01/2016 1143   Iron/TIBC/Ferritin/ %Sat    Component Value Date/Time   IRON 55 12/14/2016 1146   TIBC 329 12/14/2016 1146   FERRITIN 479 (H) 12/14/2016 1146   FERRITIN 362 (H) 04/15/2015 1058   IRONPCTSAT 17 12/14/2016 1146   Lipid Panel     Component Value Date/Time   CHOL 229 (H) 09/12/2018 0916   TRIG 127 09/12/2018 0916   HDL 43 09/12/2018 0916   CHOLHDL 6.4 11/11/2010 0555   VLDL 17 11/11/2010 0555   LDLCALC 161 (H) 09/12/2018 0916   Hepatic Function Panel     Component Value Date/Time   PROT 7.1 09/12/2018 0916   PROT 7.6 06/01/2016 1143   ALBUMIN 4.1 09/12/2018 0916   ALBUMIN 3.2 (L) 06/01/2016 1143   AST 13 09/12/2018 0916   AST 10 08/30/2017 0736   AST 11 06/01/2016 1143   ALT 13 09/12/2018 0916   ALT 9 08/30/2017 0736   ALT 12 06/01/2016 1143   ALKPHOS 73 09/12/2018 0916   ALKPHOS 69 06/01/2016 1143   BILITOT 0.3 09/12/2018 0916   BILITOT 0.4 08/30/2017 0736   BILITOT 0.28 06/01/2016 1143    Results for KHALEELAH, YOWELL (MRN [id number]) as of 05/07/2019 08:04  Ref. Range 09/12/2018 09:16  Vitamin D, 25-Hydroxy Latest Ref Range: 30.0 - 100.0 ng/mL 51.7   I, [name] Song, am acting as Location manager for [employer], PA-C I, [name], PA-C have reviewed above note and agree with its content

## 2019-05-20 ENCOUNTER — Ambulatory Visit (INDEPENDENT_AMBULATORY_CARE_PROVIDER_SITE_OTHER): Payer: Medicare Other | Admitting: Physician Assistant

## 2019-05-21 ENCOUNTER — Ambulatory Visit: Payer: Medicare Other | Admitting: Podiatry

## 2019-05-21 ENCOUNTER — Other Ambulatory Visit: Payer: Self-pay

## 2019-05-21 ENCOUNTER — Ambulatory Visit (INDEPENDENT_AMBULATORY_CARE_PROVIDER_SITE_OTHER): Payer: Medicare Other

## 2019-05-21 ENCOUNTER — Encounter: Payer: Self-pay | Admitting: Podiatry

## 2019-05-21 VITALS — BP 154/77 | HR 60 | Resp 18

## 2019-05-21 DIAGNOSIS — G629 Polyneuropathy, unspecified: Secondary | ICD-10-CM

## 2019-05-21 DIAGNOSIS — N812 Incomplete uterovaginal prolapse: Secondary | ICD-10-CM | POA: Insufficient documentation

## 2019-05-21 DIAGNOSIS — M76822 Posterior tibial tendinitis, left leg: Secondary | ICD-10-CM

## 2019-05-21 DIAGNOSIS — M659 Synovitis and tenosynovitis, unspecified: Secondary | ICD-10-CM

## 2019-05-21 DIAGNOSIS — M76821 Posterior tibial tendinitis, right leg: Secondary | ICD-10-CM | POA: Diagnosis not present

## 2019-05-21 MED ORDER — PREGABALIN 75 MG PO CAPS: 75.0000 mg | ORAL_CAPSULE | Freq: Two times a day (BID) | ORAL | 3 refills | Status: DC

## 2019-05-22 ENCOUNTER — Telehealth: Payer: Self-pay | Admitting: Podiatry

## 2019-05-22 NOTE — Telephone Encounter (Signed)
Patient called to say that the medication give to her to fill her insurance will not cover it  Is there another medication that can be given

## 2019-05-25 NOTE — Telephone Encounter (Signed)
Only other Rx is gabapentin, but I think she's had that in the past and didn't do well with it. Call her to verify. Sent gabapentin if she is okay with it. No other options otherwise. - Dr. Amalia Hailey

## 2019-05-25 NOTE — Progress Notes (Signed)
   HPI: 73 y.o. female presenting today as a new patient with a chief complaint of pain to the bilateral toes, left worse than right, that began several months ago. She reports associated numbness, tingling and burning. She reports swelling that is worse at night. She reports increases pain in her ankles when walking for long periods of time. She was taking Gabapentin that helped provide relief. She is now using a cream that only provides minimal relief. Patient is here for further evaluation and treatment.   Past Medical History:  Diagnosis Date  . Anemia   . Anxiety    takes Xanax daily  . Arthritis   . Back pain   . Blood transfusion   . Breast cancer ([place]) 2013   left breast  . Bronchitis    hx of  . Chronic fatigue syndrome   . Depression    takes Lexapro daily  . Diabetes mellitus without complication ([place])    boderline  . Diverticulosis   . Edema    feet and legs  . Headache(784.0)   . History of colon polyps 1998   adenomatous  . Hyperlipidemia    takes Niacin daily  . Hypertension    takes Verapamil and Avapro daily  . Joint pain   . OSA (obstructive sleep apnea)   . Personal history of radiation therapy   . Pneumonia 4/12   Albuterol daily as needed  . Radiation 10/29/11-11/24/11   left breast 6100 cGy  . Shortness of breath    with exertion  . Spinal stenosis   . Tinnitus      Physical Exam: General: The patient is alert and oriented x3 in no acute distress.  Dermatology: Skin is warm, dry and supple bilateral lower extremities. Negative for open lesions or macerations.  Vascular: Palpable pedal pulses bilaterally. No edema or erythema noted. Capillary refill within normal limits.  Neurological: Epicritic and protective threshold diminished bilaterally.   Musculoskeletal Exam: Pain with palpation noted to the medial, anterior and lateral aspects of the bilateral ankle joints. Range of motion within normal limits to all pedal and ankle joints bilateral.  Muscle strength 5/5 in all groups bilateral.   Radiographic Exam:  Normal osseous mineralization. Joint spaces preserved. No fracture/dislocation/boney destruction.    Assessment: 1. Synovitis / DJD bilateral ankles 2. Peripheral polyneuropathy BLE   Plan of Care:  1. Patient evaluated. X-Rays reviewed.  2. Prescription for Lyrica 75 mg BID provided to patient.  3. Injection of 0.5 mLs Celestone Soluspan injected into the bilateral ankle joints.  4. Recommended compression socks bilaterally.  5. Return to clinic in 3 months.       Edrick Kins, DPM [facility]  Dr. Edrick Kins, DPM    [address], [place] [address]                Office [phone]  Fax [phone]

## 2019-05-26 ENCOUNTER — Other Ambulatory Visit: Payer: Self-pay

## 2019-05-26 ENCOUNTER — Ambulatory Visit: Payer: Medicare Other | Admitting: Pulmonary Disease

## 2019-05-26 ENCOUNTER — Encounter: Payer: Self-pay | Admitting: Pulmonary Disease

## 2019-05-26 VITALS — BP 142/86 | HR 69 | Temp 97.2°F | Ht 66.0 in | Wt 282.2 lb

## 2019-05-26 DIAGNOSIS — G478 Other sleep disorders: Secondary | ICD-10-CM

## 2019-05-26 DIAGNOSIS — G4733 Obstructive sleep apnea (adult) (pediatric): Secondary | ICD-10-CM | POA: Diagnosis not present

## 2019-05-26 DIAGNOSIS — Z9989 Dependence on other enabling machines and devices: Secondary | ICD-10-CM | POA: Diagnosis not present

## 2019-05-26 DIAGNOSIS — E669 Obesity, unspecified: Secondary | ICD-10-CM

## 2019-05-26 DIAGNOSIS — G4730 Sleep apnea, unspecified: Secondary | ICD-10-CM | POA: Diagnosis not present

## 2019-05-26 NOTE — Telephone Encounter (Signed)
Pt returned my call, states she thought the Gabapentin had been causing an itch so she stopped it, but now she doesn't know if the Gabapentin cause, she still has itching.

## 2019-05-26 NOTE — Patient Instructions (Signed)
Follow up in 1 year.

## 2019-05-26 NOTE — Progress Notes (Signed)
[facility]  Chief Complaint  Patient presents with  . OSA on CPAP    Constitutional:  BP (!) 142/86 (BP Location: Left Arm, Patient Position: Sitting, Cuff Size: Large)   Pulse 69   Temp (!) 97.2 F (36.2 C)   Ht '5\' 6"'  (1.676 m)   Wt 282 lb 3.2 oz (128 kg)   SpO2 98% Comment: on room air  BMI 45.55 kg/m   Past Medical History:  HLD, HTN, HA, Anemia, Spinal stenosis, Breast cancer 2013, Depression  Brief Summary:  [name] is a 73 y.o. female with obstructive sleep apnea.  She received new CPAP machine.  Working well.  Uses nasal pillows mask.  No longer having episodes of sleep paralysis.  Not having sinus congestion, sore throat, dry mouth, or aerophagia.  Physical Exam:   Appearance - well kempt   ENMT - no sinus tenderness, no nasal discharge, no oral exudate, MP 3  Neck - no masses, trachea midline, no thyromegaly, no elevation in JVP  Respiratory - normal appearance of chest wall, normal respiratory effort w/o accessory muscle use, no dullness on percussion, no wheezing or rales  CV - s1s2 regular rate and rhythm, no murmurs, no peripheral edema, radial pulses symmetric  GI - soft, non tender  Lymph - no adenopathy noted in neck and axillary areas  MSK - normal gait  Ext - no cyanosis, clubbing, or joint inflammation noted  Skin - no rashes, lesions, or ulcers  Neuro - normal strength, oriented x 3  Psych - normal mood and affect   Assessment/Plan:   Obstructive sleep apnea. - she is compliant with CPAP and reports benefit from therapy - continue auto CPAP  Sleep paralysis. - likely related to arousals during sleep apnea events - resolved since getting new CPAP machine  Obesity. - discussed importance of weight loss   Patient Instructions  Follow up in 1 year    Chesley Mires, MD [place] Pager: [phone] 05/26/2019, 9:50 AM  Flow Sheet    Sleep tests:  PSG  08/05/04 >> AHI 139 Auto CPAP 04/23/19 to 05/22/19 >> used on 30 of 30 nights with average 8 hrs 35 min.  Average AHI 2.2 with median CPAP 8 and 95 th percentile CPAP 12 cm H2O  Medications:   Allergies as of 05/26/2019      Reactions   Pregabalin    Other reaction(s): Dizziness   Cymbalta [duloxetine Hcl] Other (See Comments)   Stomach pain   Gabapentin Itching   Lipitor [atorvastatin] Other (See Comments)   Cramps   Lisinopril Swelling   Maxzide [hydrochlorothiazide W-triamterene] Other (See Comments)   Cramping   Pravastatin Sodium Other (See Comments)   Headache   Simvastatin Other (See Comments)   Memory loss   Lodine [etodolac] Itching      Medication List       Accurate as of May 26, 2019  9:50 AM. If you have any questions, ask your nurse or doctor.        STOP taking these medications   pregabalin 75 MG capsule Commonly known as: Lyrica Stopped by: Chesley Mires, MD     TAKE these medications   amitriptyline 25 MG tablet Commonly known as: ELAVIL Take 25 mg by mouth every morning.   aspirin 81 MG tablet Take 81 mg by mouth daily.   atorvastatin 20 MG tablet Commonly known as: LIPITOR Take 20 mg by mouth once a week.   blood glucose  meter kit and supplies Kit Dispense based on patient and insurance preference. Use up to four times daily as directed. (FOR ICD-9 250.00, 250.01).   Durezol 0.05 % Emul Generic drug: Difluprednate Place 1 drop into the left eye daily.   escitalopram 10 MG tablet Commonly known as: LEXAPRO Take 10 mg by mouth daily.   ezetimibe 10 MG tablet Commonly known as: ZETIA   ferrous sulfate 325 (65 FE) MG tablet Take 1 tablet (325 mg total) by mouth daily with breakfast.   Fish Oil 1200 MG Caps Take 500 mg by mouth daily.   fluticasone 0.05 % cream Commonly known as: CUTIVATE   hydrochlorothiazide 25 MG tablet Commonly known as: HYDRODIURIL   HYDROXYZINE HCL PO   irbesartan 300 MG tablet Commonly known as:  AVAPRO Take 1 tablet (300 mg total) by mouth at bedtime.   ketoconazole 2 % cream Commonly known as: NIZORAL Apply 1 application topically daily.   METFORMIN HCL PO   niacin 500 MG CR capsule Take 500 mg by mouth 2 (two) times daily with a meal.   tiZANidine 2 MG tablet Commonly known as: ZANAFLEX Take 2 mg by mouth at bedtime.   verapamil 240 MG 24 hr capsule Commonly known as: VERELAN PM Take 240 mg by mouth daily.   Vitamin D (Cholecalciferol) 10 MCG (400 UNIT) Tabs Take 400 Units by mouth at bedtime.   Vitamin D (Ergocalciferol) 1.25 MG (50000 UT) Caps capsule Commonly known as: DRISDOL Take 1 capsule (50,000 Units total) by mouth every 7 (seven) days.       Past Surgical History:  She  has a past surgical history that includes Total knee arthroplasty (2006); Polypectomy (1990's); Appendectomy (1978); Thyroid cyst excision (1994); Transphenoidal / transnasal hypophysectomy / resection pituitary tumor (6/11); Blepharoptosis repair; Joint replacement (Bilateral); Colonoscopy w/ polypectomy; Breast lumpectomy with needle localization (Right, 02/06/2013); Hardware Removal (Left, 04/20/2015); Colonoscopy with propofol (N/A, 10/27/2015); Back surgery; Breast lumpectomy (08/2011); and Breast excisional biopsy (Right, 02/06/2013).  Family History:  Her family history includes Breast cancer (age of onset: 46) in her sister; Cancer (age of onset: 64) in her sister; Clotting disorder in her father; Diabetes in her mother; Heart Problems in her father; Heart disease in her father; Hyperlipidemia in her mother; Hypertension in her father and mother; Obesity in her mother; Stroke in her father and mother.  Social History:  She  reports that she has never smoked. She has never used smokeless tobacco. She reports that she does not drink alcohol or use drugs.

## 2019-05-26 NOTE — Telephone Encounter (Signed)
Left message for pt to call to discuss pain relief options.

## 2019-06-24 ENCOUNTER — Other Ambulatory Visit (INDEPENDENT_AMBULATORY_CARE_PROVIDER_SITE_OTHER): Payer: Self-pay | Admitting: Physician Assistant

## 2019-06-24 DIAGNOSIS — E559 Vitamin D deficiency, unspecified: Secondary | ICD-10-CM

## 2019-06-24 DIAGNOSIS — E119 Type 2 diabetes mellitus without complications: Secondary | ICD-10-CM

## 2019-06-24 MED ORDER — METFORMIN HCL 500 MG PO TABS: 500.0000 mg | ORAL_TABLET | Freq: Every day | ORAL | 0 refills | Status: DC

## 2019-06-24 MED ORDER — VITAMIN D (ERGOCALCIFEROL) 1.25 MG (50000 UNIT) PO CAPS: 50000.0000 [IU] | ORAL_CAPSULE | ORAL | 0 refills | Status: DC

## 2019-06-24 NOTE — Telephone Encounter (Signed)
Medication sent to pharmacy  

## 2019-06-24 NOTE — Telephone Encounter (Signed)
Medication request approved.

## 2019-06-30 LAB — BASIC METABOLIC PANEL
BUN: 19 (ref 4–21)
CO2: 29 — AB (ref 13–22)
Chloride: 105 (ref 99–108)
Creatinine: 1.2 — AB (ref 0.5–1.1)
Glucose: 100
Potassium: 3.9 (ref 3.4–5.3)
Sodium: 142 (ref 137–147)

## 2019-06-30 LAB — CBC AND DIFFERENTIAL
HCT: 35 — AB (ref 36–46)
Hemoglobin: 11.6 — AB (ref 12.0–16.0)
Platelets: 272 (ref 150–399)
WBC: 7.5

## 2019-06-30 LAB — HEPATIC FUNCTION PANEL
ALT: 13 (ref 7–35)
AST: 12 — AB (ref 13–35)

## 2019-06-30 LAB — LIPID PANEL
Cholesterol: 218 — AB (ref 0–200)
Triglycerides: 149 (ref 40–160)

## 2019-06-30 LAB — CBC: RBC: 4.38 (ref 3.87–5.11)

## 2019-06-30 LAB — COMPREHENSIVE METABOLIC PANEL
Albumin: 4 (ref 3.5–5.0)
Calcium: 10.6 (ref 8.7–10.7)
GFR calc Af Amer: 51
GFR calc non Af Amer: 42

## 2019-06-30 LAB — HEMOGLOBIN A1C: Hemoglobin A1C: 6.1

## 2019-07-10 ENCOUNTER — Ambulatory Visit (INDEPENDENT_AMBULATORY_CARE_PROVIDER_SITE_OTHER): Payer: Medicare Other | Admitting: Physician Assistant

## 2019-07-10 ENCOUNTER — Encounter (INDEPENDENT_AMBULATORY_CARE_PROVIDER_SITE_OTHER): Payer: Self-pay | Admitting: Physician Assistant

## 2019-07-10 ENCOUNTER — Other Ambulatory Visit (INDEPENDENT_AMBULATORY_CARE_PROVIDER_SITE_OTHER): Payer: Self-pay | Admitting: Physician Assistant

## 2019-07-10 ENCOUNTER — Other Ambulatory Visit: Payer: Self-pay

## 2019-07-10 VITALS — BP 156/78 | HR 79 | Temp 98.4°F | Ht 66.0 in | Wt 279.0 lb

## 2019-07-10 DIAGNOSIS — E119 Type 2 diabetes mellitus without complications: Secondary | ICD-10-CM

## 2019-07-10 DIAGNOSIS — E559 Vitamin D deficiency, unspecified: Secondary | ICD-10-CM | POA: Diagnosis not present

## 2019-07-10 DIAGNOSIS — Z6841 Body Mass Index (BMI) 40.0 and over, adult: Secondary | ICD-10-CM

## 2019-07-10 MED ORDER — VITAMIN D (ERGOCALCIFEROL) 1.25 MG (50000 UNIT) PO CAPS: 50000.0000 [IU] | ORAL_CAPSULE | ORAL | 0 refills | Status: DC

## 2019-07-10 MED ORDER — METFORMIN HCL 500 MG PO TABS: 500.0000 mg | ORAL_TABLET | Freq: Every day | ORAL | 0 refills | Status: DC

## 2019-07-14 ENCOUNTER — Encounter (INDEPENDENT_AMBULATORY_CARE_PROVIDER_SITE_OTHER): Payer: Self-pay

## 2019-07-14 NOTE — Progress Notes (Signed)
Office: [phone]  /  Fax: [phone]   HPI:  Chief Complaint: OBESITY [name] is here to discuss her progress with her obesity treatment plan. She is on the Pescatarian eating plan and states she is following her eating plan approximately 50 % of the time. She states she is exercising 0 minutes 0 times per week.  [name] has been more sedentary and not been out due to [place]. She is having right leg pain, between her hip and knee and she has an appointment the end of this month for evaluation. She is not eating all of her protein throughout the day.  Diabetes II [name] has a diagnosis of diabetes type II. [name] is on Metformin and she has no nausea, vomiting or diarrhea. She has no polyphagia or hypoglycemia.  Vitamin D deficiency [name] has a diagnosis of vitamin D deficiency. [name] is on vitamin D and she has no nausea, vomiting or muscle weakness.  Today's visit was # 58 Starting weight: 280 lbs Starting date: 08/31/2016 Today's weight : 279 lbs  Today's date: 07/10/2019 Total lbs lost to date: 1 Total lbs lost since last in-office visit: 0  ASSESSMENT AND PLAN:  Type 2 diabetes mellitus without complication, without long-term current use of insulin (HCC) - Plan: metFORMIN (GLUCOPHAGE) 500 MG tablet  Vitamin D deficiency - Plan: Vitamin D, Ergocalciferol, (DRISDOL) 1.25 MG (50000 UT) CAPS capsule  Class 3 severe obesity with serious comorbidity and body mass index (BMI) of 45.0 to 49.9 in adult, unspecified obesity type ([place])  PLAN:  Diabetes II [name] has been given diabetes education by myself today. Good blood sugar control is important to decrease the likelihood of diabetic complications such as nephropathy, neuropathy, limb loss, blindness, coronary artery disease, and death. Intensive lifestyle modification including diet, exercise and weight loss were discussed as the first line treatment for diabetes. Rami agrees to continue metformin 500 mg daily #30 with no refills and  follow up as directed.  Vitamin D Deficiency Low vitamin D level contributes to fatigue and are associated with obesity, breast, and colon cancer. [name] agrees to continue to take prescription Vit D _0 ,000 IU every week #2 with no refills and she will follow up for routine testing of vitamin D, at least 2-3 times per year to avoid over-replacement. [name] agrees to follow up as directed.  Obesity [name] is currently in the action stage of change. As such, her goal is to continue with weight loss efforts She has agreed to follow the Category 2 plan [name] has been instructed to work up to a goal of 150 minutes of combined cardio and strengthening exercise per week for weight loss and overall health benefits. We discussed the following Behavioral Modification Strategies today: no skipping meals and increasing lean protein intake  [name] has agreed to follow up with our clinic in 4 weeks. She was informed of the importance of frequent follow up visits to maximize her success with intensive lifestyle modifications for her multiple health conditions.  ALLERGIES: Allergies  Allergen Reactions  . Pregabalin     Other reaction(s): Dizziness  . Cymbalta [Duloxetine Hcl] Other (See Comments)    Stomach pain  . Gabapentin Itching  . Lipitor [Atorvastatin] Other (See Comments)    Cramps  . Lisinopril Swelling  . Maxzide [Hydrochlorothiazide W-Triamterene] Other (See Comments)    Cramping   . Pravastatin Sodium Other (See Comments)    Headache   . Simvastatin Other (See Comments)    Memory loss  . Lodine [Etodolac]  Itching    MEDICATIONS: Current Outpatient Medications on File Prior to Visit  Medication Sig Dispense Refill  . amitriptyline (ELAVIL) 25 MG tablet Take 25 mg by mouth every morning.     [name] aspirin 81 MG tablet Take 81 mg by mouth daily.    [name] atorvastatin (LIPITOR) 20 MG tablet Take 20 mg by mouth once a week.    . blood glucose meter kit and supplies KIT Dispense based on  patient and insurance preference. Use up to four times daily as directed. (FOR ICD-9 250.00, 250.01). 1 each 0  . DUREZOL 0.05 % EMUL Place 1 drop into the left eye daily.  0  . escitalopram (LEXAPRO) 10 MG tablet Take 10 mg by mouth daily.    [name] ezetimibe (ZETIA) 10 MG tablet     . ferrous sulfate 325 (65 FE) MG tablet Take 1 tablet (325 mg total) by mouth daily with breakfast. 30 tablet 0  . fluticasone (CUTIVATE) 0.05 % cream     . hydrochlorothiazide (HYDRODIURIL) 25 MG tablet     . HYDROXYZINE HCL PO     . irbesartan (AVAPRO) 300 MG tablet Take 1 tablet (300 mg total) by mouth at bedtime. 30 tablet 0  . ketoconazole (NIZORAL) 2 % cream Apply 1 application topically daily. 15 g 0  . niacin 500 MG CR capsule Take 500 mg by mouth 2 (two) times daily with a meal.    . Omega-3 Fatty Acids (FISH OIL) 1200 MG CAPS Take 500 mg by mouth daily.     [name] tiZANidine (ZANAFLEX) 2 MG tablet Take 2 mg by mouth at bedtime.    . verapamil (VERELAN PM) 240 MG 24 hr capsule Take 240 mg by mouth daily.     . Vitamin D, Cholecalciferol, 400 UNITS TABS Take 400 Units by mouth at bedtime.     No current facility-administered medications on file prior to visit.    PAST MEDICAL HISTORY: Past Medical History:  Diagnosis Date  . Anemia   . Anxiety    takes Xanax daily  . Arthritis   . Back pain   . Blood transfusion   . Breast cancer ([place]) 2013   left breast  . Bronchitis    hx of  . Chronic fatigue syndrome   . Depression    takes Lexapro daily  . Diabetes mellitus without complication ([place])    boderline  . Diverticulosis   . Edema    feet and legs  . Headache(784.0)   . History of colon polyps 1998   adenomatous  . Hyperlipidemia    takes Niacin daily  . Hypertension    takes Verapamil and Avapro daily  . Joint pain   . OSA (obstructive sleep apnea)   . Personal history of radiation therapy   . Pneumonia 4/12   Albuterol daily as needed  . Radiation 10/29/11-11/24/11   left breast 6100 cGy   . Shortness of breath    with exertion  . Spinal stenosis   . Tinnitus     PAST SURGICAL HISTORY: Past Surgical History:  Procedure Laterality Date  . APPENDECTOMY  1978  . BACK SURGERY    . BELPHAROPTOSIS REPAIR    . BREAST EXCISIONAL BIOPSY Right 02/06/2013  . BREAST LUMPECTOMY  08/2011   left  . BREAST LUMPECTOMY WITH NEEDLE LOCALIZATION Right 02/06/2013   Procedure: RIGHT BREAST LUMPECTOMY WITH NEEDLE LOCALIZATION;  Surgeon: Harl Bowie, MD;  Location: [facility];  Service: General;  Laterality: Right;  .  COLONOSCOPY W/ POLYPECTOMY    . COLONOSCOPY WITH PROPOFOL N/A 10/27/2015   Procedure: COLONOSCOPY WITH PROPOFOL;  Surgeon: Mauri Pole, MD;  Location: [facility];  Service: Endoscopy;  Laterality: N/A;  . HARDWARE REMOVAL Left 04/20/2015   Procedure: HARDWARE REMOVAL LEFT LUMBAR FIVE SCREW;  Surgeon: Karie Chimera, MD;  Location: [facility];  Service: Neurosurgery;  Laterality: Left;  . JOINT REPLACEMENT Bilateral    bilateral knee  . POLYPECTOMY  1990's  . THYROID CYST EXCISION  1994  . TOTAL KNEE ARTHROPLASTY  2006   bilateral  . TRANSPHENOIDAL / TRANSNASAL HYPOPHYSECTOMY / RESECTION PITUITARY TUMOR  6/11    SOCIAL HISTORY: Social History   Tobacco Use  . Smoking status: Never Smoker  . Smokeless tobacco: Never Used  Substance Use Topics  . Alcohol use: No  . Drug use: No    FAMILY HISTORY: Family History  Problem Relation Age of Onset  . Heart disease Father   . Clotting disorder Father   . Stroke Father   . Hypertension Father   . Heart Problems Father   . Stroke Mother   . Diabetes Mother   . Hypertension Mother   . Hyperlipidemia Mother   . Obesity Mother   . Cancer Sister 68       Breast Cancer  . Breast cancer Sister 21  . Colon cancer Neg Hx   . Neuropathy Neg Hx     ROS: Review of Systems  Constitutional: Negative for weight loss.  Gastrointestinal: Negative for diarrhea, nausea and vomiting.  Musculoskeletal:       Negative for  muscle weakness  Endo/Heme/Allergies:       Negative for polyphagia Negative for hypoglycemia    PHYSICAL EXAM: Blood pressure (!) 156/78, pulse 79, temperature 98.4 F (36.9 C), temperature source Oral, height _0  (1.676 m), weight 279 lb (126.6 kg), SpO2 99 %. Body mass index is 45.03 kg/m. Physical Exam Vitals reviewed.  Constitutional:      General: She is not in acute distress.    Appearance: Normal appearance. She is well-developed. She is obese.  Cardiovascular:     Rate and Rhythm: Normal rate.  Pulmonary:     Effort: Pulmonary effort is normal.  Musculoskeletal:        General: Normal range of motion.  Skin:    General: Skin is warm and dry.  Neurological:     Mental Status: She is alert and oriented to person, place, and time.  Psychiatric:        Mood and Affect: Mood normal.        Behavior: Behavior normal.     RECENT LABS AND TESTS: BMET    Component Value Date/Time   NA 140 09/12/2018 0916   NA 140 06/01/2016 1143   K 4.2 09/12/2018 0916   K 4.0 06/01/2016 1143   CL 103 09/12/2018 0916   CL 105 12/09/2012 1423   CO2 23 09/12/2018 0916   CO2 26 06/01/2016 1143   GLUCOSE 96 09/12/2018 0916   GLUCOSE 126 08/30/2017 0736   GLUCOSE 121 06/01/2016 1143   GLUCOSE 177 (H) 12/09/2012 1423   BUN 17 09/12/2018 0916   BUN 12.5 06/01/2016 1143   CREATININE 1.23 (H) 09/12/2018 0916   CREATININE 1.17 (H) 08/30/2017 0736   CREATININE 1.1 06/01/2016 1143   CALCIUM 10.3 09/12/2018 0916   CALCIUM 10.1 06/01/2016 1143   GFRNONAA 43 (L) 09/12/2018 0916   GFRNONAA 45 (L) 08/30/2017 0736   GFRAA 50 (L) 09/12/2018  0916   GFRAA 52 (L) 08/30/2017 0736   Lab Results  Component Value Date   HGBA1C 6.1 (H) 09/12/2018   HGBA1C 6.1 (H) 03/28/2018   HGBA1C 6.8 (H) 05/10/2017   HGBA1C 6.2 (H) 12/14/2016   HGBA1C 6.3 (H) 08/31/2016   Lab Results  Component Value Date   INSULIN 19.7 09/12/2018   INSULIN 20.7 03/28/2018   INSULIN 10.5 05/10/2017   INSULIN 20.9  12/14/2016   INSULIN 23.5 08/31/2016   CBC    Component Value Date/Time   WBC 7.1 08/30/2017 0736   WBC 14.5 (H) 08/21/2016 2027   RBC 4.05 08/30/2017 0736   HGB 10.7 (L) 08/30/2017 0736   HGB 11.0 (L) 05/10/2017 1006   HGB 11.1 (L) 06/01/2016 1143   HCT 32.7 (L) 08/30/2017 0736   HCT 34.4 05/10/2017 1006   HCT 34.9 06/01/2016 1143   PLT 243 08/30/2017 0736   PLT 248 06/01/2016 1143   MCV 80.7 08/30/2017 0736   MCV 80 05/10/2017 1006   MCV 81.2 06/01/2016 1143   MCH 26.3 08/30/2017 0736   MCHC 32.6 08/30/2017 0736   RDW 15.8 (H) 08/30/2017 0736   RDW 16.5 (H) 05/10/2017 1006   RDW 15.8 (H) 06/01/2016 1143   LYMPHSABS 1.8 08/30/2017 0736   LYMPHSABS 1.3 05/10/2017 1006   LYMPHSABS 1.8 06/01/2016 1143   MONOABS 0.5 08/30/2017 0736   MONOABS 0.6 06/01/2016 1143   EOSABS 0.1 08/30/2017 0736   EOSABS 0.1 05/10/2017 1006   BASOSABS 0.0 08/30/2017 0736   BASOSABS 0.0 05/10/2017 1006   BASOSABS 0.0 06/01/2016 1143   Iron/TIBC/Ferritin/ %Sat    Component Value Date/Time   IRON 55 12/14/2016 1146   TIBC 329 12/14/2016 1146   FERRITIN 479 (H) 12/14/2016 1146   FERRITIN 362 (H) 04/15/2015 1058   IRONPCTSAT 17 12/14/2016 1146   Lipid Panel     Component Value Date/Time   CHOL 229 (H) 09/12/2018 0916   TRIG 127 09/12/2018 0916   HDL 43 09/12/2018 0916   CHOLHDL 6.4 11/11/2010 0555   VLDL 17 11/11/2010 0555   LDLCALC 161 (H) 09/12/2018 0916   Hepatic Function Panel     Component Value Date/Time   PROT 7.1 09/12/2018 0916   PROT 7.6 06/01/2016 1143   ALBUMIN 4.1 09/12/2018 0916   ALBUMIN 3.2 (L) 06/01/2016 1143   AST 13 09/12/2018 0916   AST 10 08/30/2017 0736   AST 11 06/01/2016 1143   ALT 13 09/12/2018 0916   ALT 9 08/30/2017 0736   ALT 12 06/01/2016 1143   ALKPHOS 73 09/12/2018 0916   ALKPHOS 69 06/01/2016 1143   BILITOT 0.3 09/12/2018 0916   BILITOT 0.4 08/30/2017 0736   BILITOT 0.28 06/01/2016 1143      OBESITY BEHAVIORAL INTERVENTION VISIT  DOCUMENTATION FOR INSURANCE (~15 minutes)    ASK: We discussed the diagnosis of obesity with Luna Kitchens today and Eliyah agreed to give [name] permission to discuss obesity behavioral modification therapy today.  ASSESS: Voncile has the diagnosis of obesity and her BMI today is 45.05 Olimpia is in the action stage of change   ADVISE: Adonica was educated on the multiple health risks of obesity as well as the benefit of weight loss to improve her health. She was advised of the need for long term treatment and the importance of lifestyle modifications to improve her current health and to decrease her risk of future health problems.  AGREE: Multiple dietary modification options and treatment options were discussed and  Kushi agreed  to follow the recommendations documented in the above note.  ARRANGE: Chrissa was educated on the importance of frequent visits to treat obesity as outlined per CMS and USPSTF guidelines and agreed to schedule her next follow up appointment today.   Corey Skains, am acting as transcriptionist for [name], PA-C I, [name], PA-C have reviewed above note and agree with its content

## 2019-07-15 ENCOUNTER — Other Ambulatory Visit: Payer: Self-pay | Admitting: Family Medicine

## 2019-07-15 DIAGNOSIS — Z1231 Encounter for screening mammogram for malignant neoplasm of breast: Secondary | ICD-10-CM

## 2019-08-04 ENCOUNTER — Other Ambulatory Visit (INDEPENDENT_AMBULATORY_CARE_PROVIDER_SITE_OTHER): Payer: Self-pay | Admitting: Physician Assistant

## 2019-08-04 DIAGNOSIS — E559 Vitamin D deficiency, unspecified: Secondary | ICD-10-CM

## 2019-08-07 ENCOUNTER — Ambulatory Visit: Payer: Medicare Other | Admitting: Allergy

## 2019-08-07 ENCOUNTER — Other Ambulatory Visit (INDEPENDENT_AMBULATORY_CARE_PROVIDER_SITE_OTHER): Payer: Self-pay | Admitting: Physician Assistant

## 2019-08-07 DIAGNOSIS — E119 Type 2 diabetes mellitus without complications: Secondary | ICD-10-CM

## 2019-08-12 ENCOUNTER — Ambulatory Visit (INDEPENDENT_AMBULATORY_CARE_PROVIDER_SITE_OTHER): Payer: Medicare Other | Admitting: Physician Assistant

## 2019-08-14 ENCOUNTER — Other Ambulatory Visit (INDEPENDENT_AMBULATORY_CARE_PROVIDER_SITE_OTHER): Payer: Self-pay | Admitting: Physician Assistant

## 2019-08-14 DIAGNOSIS — E119 Type 2 diabetes mellitus without complications: Secondary | ICD-10-CM

## 2019-08-18 ENCOUNTER — Other Ambulatory Visit: Payer: Self-pay

## 2019-08-18 ENCOUNTER — Ambulatory Visit (INDEPENDENT_AMBULATORY_CARE_PROVIDER_SITE_OTHER): Payer: Medicare Other | Admitting: Physician Assistant

## 2019-08-18 ENCOUNTER — Encounter (INDEPENDENT_AMBULATORY_CARE_PROVIDER_SITE_OTHER): Payer: Self-pay | Admitting: Physician Assistant

## 2019-08-18 VITALS — BP 149/78 | HR 60 | Temp 98.4°F | Ht 66.0 in | Wt 273.0 lb

## 2019-08-18 DIAGNOSIS — Z6841 Body Mass Index (BMI) 40.0 and over, adult: Secondary | ICD-10-CM

## 2019-08-18 DIAGNOSIS — I10 Essential (primary) hypertension: Secondary | ICD-10-CM | POA: Diagnosis not present

## 2019-08-18 DIAGNOSIS — E559 Vitamin D deficiency, unspecified: Secondary | ICD-10-CM | POA: Diagnosis not present

## 2019-08-18 DIAGNOSIS — E119 Type 2 diabetes mellitus without complications: Secondary | ICD-10-CM

## 2019-08-18 MED ORDER — METFORMIN HCL 500 MG PO TABS: 500.0000 mg | ORAL_TABLET | Freq: Every day | ORAL | 0 refills | Status: DC

## 2019-08-18 MED ORDER — VITAMIN D (ERGOCALCIFEROL) 1.25 MG (50000 UNIT) PO CAPS: 50000.0000 [IU] | ORAL_CAPSULE | ORAL | 0 refills | Status: DC

## 2019-08-18 MED ORDER — HYDROCHLOROTHIAZIDE 25 MG PO TABS: 25.0000 mg | ORAL_TABLET | Freq: Every day | ORAL | 0 refills | Status: DC

## 2019-08-18 NOTE — Progress Notes (Signed)
Chief Complaint:   OBESITY [name] is here to discuss her progress with her obesity treatment plan along with follow-up of her obesity related diagnoses. [name] is on the Category 2 Plan and states she is following her eating plan approximately 50% of the time. [name] states she is exercising 0 minutes 0 times per week.  Today's visit was #: 61 Starting weight: 280 lbs Starting date: 08/31/2016 Today's weight: 273 lbs Today's date: 08/18/2019 Total lbs lost to date: 7 Total lbs lost since last in-office visit: 6  Interim History: [name] states that she has been eating [employer] protein bowls. She also has been drinking protein shakes when she doesn't feel like eating.  Subjective:   Vitamin D deficiency  [name]'s Vitamin D level was 51.7 on 09/12/18. She is on vitamin D. She denies nausea, vomiting or muscle weakness.  Essential hypertension [name] is on Chlorthalidone, Irbesartan and Verapamil. Her blood pressure is slightly elevated today. She feels that her stress level is due to COVID and news related issues.  BP Readings from Last 3 Encounters:  08/18/19 (!) 149/78  07/10/19 (!) 156/78  05/26/19 (!) 142/86   Lab Results  Component Value Date   CREATININE 1.2 (A) 06/30/2019   CREATININE 1.23 (H) 09/12/2018   CREATININE 1.19 (H) 03/28/2018   Type 2 diabetes mellitus without complication, without long-term current use of insulin ([name])  [name] is not checking her blood sugar at home. She is on metformin and she has no nausea, vomiting or diarrhea. [name] has been out of it for two weeks. Her last A1c was 6.1 (06/30/19).  Lab Results  Component Value Date   HGBA1C 6.1 06/30/2019   HGBA1C 6.1 (H) 09/12/2018   HGBA1C 6.1 (H) 03/28/2018   Lab Results  Component Value Date   LDLCALC 161 (H) 09/12/2018   CREATININE 1.2 (A) 06/30/2019    Assessment/Plan:   Vitamin D deficiency  Low Vitamin D level contributes to fatigue and are associated with obesity, breast, and  colon cancer. [name] agrees to continue to take prescription Vitamin D @50 ,000 IU every week #2 with no refills and she will follow-up for routine testing of Vitamin D, at least 2-3 times per year to avoid over-replacement.  Essential hypertension [name] is working on healthy weight loss and exercise to improve blood pressure control. She agrees to continue HCTZ 25 mg daily #30 with no refills. We will watch for signs of hypotension as she continues her lifestyle modifications.  Type 2 diabetes mellitus without complication, without long-term current use of insulin (HCC) Good blood sugar control is important to decrease the likelihood of diabetic complications such as nephropathy, neuropathy, limb loss, blindness, coronary artery disease, and death. Intensive lifestyle modification including diet, exercise and weight loss are the first line of treatment for diabetes. [name] agrees to continue metformin 500 mg daily with breakfast #30 with no refills.  Obesity [name] is currently in the action stage of change. As such, her goal is to continue with weight loss efforts. She has agreed to the Category 2 Plan.   Exercise goals: [name] should follow the adult guidelines. When [name] cannot meet the adult guidelines, they should be as physically active as their abilities and conditions will allow.  [name] should do exercises that maintain or improve balance if they are at risk of falling.   Behavioral modification strategies: increasing lean protein intake and meal planning and cooking strategies.  [name] has agreed to follow-up with our clinic in  3 weeks. She was informed of the importance of frequent follow-up visits to maximize her success with intensive lifestyle modifications for her multiple health conditions.   Objective:   Blood pressure (!) 149/78, pulse 60, temperature 98.4 F (36.9 C), temperature source Oral, height 5\' 6"  (1.676 m), weight 273 lb (123.8 kg), SpO2 98 %. Body  mass index is 44.06 kg/m.  General: Cooperative, alert, well developed, in no acute distress. HEENT: Conjunctivae and lids unremarkable. Cardiovascular: Regular rhythm.  Lungs: Normal work of breathing. Neurologic: No focal deficits.   Lab Results  Component Value Date   CREATININE 1.2 (A) 06/30/2019   BUN 19 06/30/2019   NA 142 06/30/2019   K 3.9 06/30/2019   CL 105 06/30/2019   CO2 29 (A) 06/30/2019   Lab Results  Component Value Date   ALT 13 06/30/2019   AST 12 (A) 06/30/2019   ALKPHOS 73 09/12/2018   BILITOT 0.3 09/12/2018   Lab Results  Component Value Date   HGBA1C 6.1 06/30/2019   HGBA1C 6.1 (H) 09/12/2018   HGBA1C 6.1 (H) 03/28/2018   HGBA1C 6.8 (H) 05/10/2017   HGBA1C 6.2 (H) 12/14/2016   Lab Results  Component Value Date   INSULIN 19.7 09/12/2018   INSULIN 20.7 03/28/2018   INSULIN 10.5 05/10/2017   INSULIN 20.9 12/14/2016   INSULIN 23.5 08/31/2016   Lab Results  Component Value Date   TSH 1.710 08/31/2016   Lab Results  Component Value Date   CHOL 218 (A) 06/30/2019   HDL 43 09/12/2018   LDLCALC 161 (H) 09/12/2018   TRIG 149 06/30/2019   CHOLHDL 6.4 11/11/2010   Lab Results  Component Value Date   WBC 7.5 06/30/2019   HGB 11.6 (A) 06/30/2019   HCT 35 (A) 06/30/2019   MCV 80.7 08/30/2017   PLT 272 06/30/2019   Lab Results  Component Value Date   IRON 55 12/14/2016   TIBC 329 12/14/2016   FERRITIN 479 (H) 12/14/2016    Ref. Range 09/12/2018 09:16  Vitamin D, 25-Hydroxy Latest Ref Range: 30.0 - 100.0 ng/mL 51.7    Obesity Behavioral Intervention Documentation for Insurance:   Approximately 15 minutes were spent on the discussion below.  ASK: We discussed the diagnosis of obesity with [name] today and [name] agreed to give [name] permission to discuss obesity behavioral modification therapy today.  ASSESS: [name] has the diagnosis of obesity and her BMI today is 44.08. [name] is in the action stage of change.   ADVISE: [name] was  educated on the multiple health risks of obesity as well as the benefit of weight loss to improve her health. She was advised of the need for long term treatment and the importance of lifestyle modifications to improve her current health and to decrease her risk of future health problems.  AGREE: Multiple dietary modification options and treatment options were discussed and [name] agreed to follow the recommendations documented in the above note.  ARRANGE: [name] was educated on the importance of frequent visits to treat obesity as outlined per CMS and USPSTF guidelines and agreed to schedule her next follow up appointment today.  Attestation Statements:   Reviewed by clinician on day of visit: allergies, medications, problem list, medical history, surgical history, family history, social history, and previous encounter notes.    Corey Skains, am acting as Location manager for [employer], PA-C.  I have reviewed the above documentation for accuracy and completeness, and I agree with the above. [name], PA-C

## 2019-08-20 ENCOUNTER — Other Ambulatory Visit: Payer: Self-pay

## 2019-08-20 ENCOUNTER — Other Ambulatory Visit (INDEPENDENT_AMBULATORY_CARE_PROVIDER_SITE_OTHER): Payer: Self-pay | Admitting: Physician Assistant

## 2019-08-20 ENCOUNTER — Ambulatory Visit: Payer: Medicare Other | Admitting: Podiatry

## 2019-08-20 DIAGNOSIS — B351 Tinea unguium: Secondary | ICD-10-CM

## 2019-08-20 DIAGNOSIS — E119 Type 2 diabetes mellitus without complications: Secondary | ICD-10-CM

## 2019-08-20 DIAGNOSIS — E0843 Diabetes mellitus due to underlying condition with diabetic autonomic (poly)neuropathy: Secondary | ICD-10-CM

## 2019-08-20 DIAGNOSIS — M659 Synovitis and tenosynovitis, unspecified: Secondary | ICD-10-CM

## 2019-08-20 DIAGNOSIS — M79676 Pain in unspecified toe(s): Secondary | ICD-10-CM

## 2019-08-22 NOTE — Progress Notes (Signed)
   SUBJECTIVE Patient with a history of diabetes mellitus presents to office today complaining of elongated, thickened nails that cause pain while ambulating in shoes. She is unable to trim her own nails.  She is also here for follow up evaluation of ankle pain and peripheral neuropathy bilaterally. She reports some continued soreness in the ankles but reports a decrease in swelling. She states the injections helped in the past and would like more. Being on the ankles increases the pain. Patient is here for further evaluation and treatment.   Past Medical History:  Diagnosis Date  . Anemia   . Anxiety    takes Xanax daily  . Arthritis   . Back pain   . Blood transfusion   . Breast cancer ([place]) 2013   left breast  . Bronchitis    hx of  . Chronic fatigue syndrome   . Depression    takes Lexapro daily  . Diabetes mellitus without complication ([place])    boderline  . Diverticulosis   . Edema    feet and legs  . Headache(784.0)   . History of colon polyps 1998   adenomatous  . Hyperlipidemia    takes Niacin daily  . Hypertension    takes Verapamil and Avapro daily  . Joint pain   . OSA (obstructive sleep apnea)   . Personal history of radiation therapy   . Pneumonia 4/12   Albuterol daily as needed  . Radiation 10/29/11-11/24/11   left breast 6100 cGy  . Shortness of breath    with exertion  . Spinal stenosis   . Tinnitus     OBJECTIVE General Patient is awake, alert, and oriented x 3 and in no acute distress. Derm Skin is dry and supple bilateral. Negative open lesions or macerations. Remaining integument unremarkable. Nails are tender, long, thickened and dystrophic with subungual debris, consistent with onychomycosis, 1-5 bilateral. No signs of infection noted. Vasc  DP and PT pedal pulses palpable bilaterally. Temperature gradient within normal limits.  Neuro Epicritic and protective threshold sensation diminished bilaterally.  Musculoskeletal Exam Pain with palpation  noted to the medial, anterior and lateral aspects of the bilateral ankle joints. No symptomatic pedal deformities noted bilateral. Muscular strength within normal limits.  ASSESSMENT 1. Diabetes Mellitus w/ peripheral neuropathy 2. Onychomycosis of nail due to dermatophyte bilateral 3. Synovitis / DJD bilateral ankles 4. Peripheral polyneuropathy BLE  PLAN OF CARE 1. Patient evaluated today. 2. Instructed to maintain good pedal hygiene and foot care. Stressed importance of controlling blood sugar.  3. Mechanical debridement of nails 1-5 bilaterally performed using a nail nipper. Filed with dremel without incident.  4. Injection of 0.5 mLs Celestone Soluspan injected into the bilateral ankle joints.  5. Return to clinic in 3 mos.     Edrick Kins, DPM [facility]  Dr. Edrick Kins, [address]                                        [place], [place] [address]                Office [phone]  Fax [phone]

## 2019-08-26 ENCOUNTER — Ambulatory Visit: Payer: Medicare Other | Admitting: Podiatry

## 2019-09-02 ENCOUNTER — Ambulatory Visit
Admission: RE | Admit: 2019-09-02 | Discharge: 2019-09-02 | Disposition: A | Discharge status: Home / Self Care | Payer: Medicare Other | Source: Ambulatory Visit | Attending: Family Medicine | Admitting: Family Medicine

## 2019-09-02 ENCOUNTER — Other Ambulatory Visit: Payer: Self-pay

## 2019-09-02 DIAGNOSIS — Z1231 Encounter for screening mammogram for malignant neoplasm of breast: Secondary | ICD-10-CM

## 2019-09-11 ENCOUNTER — Ambulatory Visit (INDEPENDENT_AMBULATORY_CARE_PROVIDER_SITE_OTHER): Payer: Medicare Other | Admitting: Physician Assistant

## 2019-09-11 ENCOUNTER — Encounter (INDEPENDENT_AMBULATORY_CARE_PROVIDER_SITE_OTHER): Payer: Self-pay | Admitting: Physician Assistant

## 2019-09-11 ENCOUNTER — Other Ambulatory Visit (INDEPENDENT_AMBULATORY_CARE_PROVIDER_SITE_OTHER): Payer: Self-pay | Admitting: Physician Assistant

## 2019-09-11 ENCOUNTER — Other Ambulatory Visit: Payer: Self-pay

## 2019-09-11 VITALS — BP 156/78 | HR 74 | Temp 98.2°F | Ht 66.0 in | Wt 271.0 lb

## 2019-09-11 DIAGNOSIS — Z6841 Body Mass Index (BMI) 40.0 and over, adult: Secondary | ICD-10-CM

## 2019-09-11 DIAGNOSIS — E7849 Other hyperlipidemia: Secondary | ICD-10-CM | POA: Diagnosis not present

## 2019-09-11 DIAGNOSIS — E559 Vitamin D deficiency, unspecified: Secondary | ICD-10-CM

## 2019-09-11 MED ORDER — VITAMIN D (ERGOCALCIFEROL) 1.25 MG (50000 UNIT) PO CAPS: 50000.0000 [IU] | ORAL_CAPSULE | ORAL | 0 refills | Status: DC

## 2019-09-11 NOTE — Progress Notes (Signed)
Chief Complaint:   OBESITY [name] is here to discuss her progress with her obesity treatment plan along with follow-up of her obesity related diagnoses. [name] is on the Category 2 Plan and states she is following her eating plan approximately 80% of the time. [name] states she is exercising 0 minutes 0 times per week.  Today's visit was #: 68 Starting weight: 280 lbs Starting date: 08/31/2016 Today's weight: 271 lbs Today's date: 09/11/2019 Total lbs lost to date: 9 Total lbs lost since last in-office visit: 2  Interim History: [name] reports that she is going to be scheduled to have an injection in her right hip with her orthopedist. She is back to eating eggs in the morning for breakfast. She reports that most days she does not eat snacks.  Subjective:   Vitamin D deficiency. Last Vitamin D level 51.7 on 09/12/2018. [name] is on weekly prescription Vitamin D.  Other hyperlipidemia. [name] is on atorvastatin weekly and on Zetia daily.  Lab Results  Component Value Date   CHOL 218 (A) 06/30/2019   HDL 43 09/12/2018   LDLCALC 161 (H) 09/12/2018   TRIG 149 06/30/2019   CHOLHDL 6.4 11/11/2010   Lab Results  Component Value Date   ALT 13 06/30/2019   AST 12 (A) 06/30/2019   ALKPHOS 73 09/12/2018   BILITOT 0.3 09/12/2018   The 10-year ASCVD risk score [name]., et al., 2013) is: 41.1%*   Values used to calculate the score:     Age: 23 years     Sex: Female     Is Non-Hispanic African American: Yes     Diabetic: Yes     Tobacco smoker: No     Systolic Blood Pressure: A999333 mmHg     Is BP treated: Yes     HDL Cholesterol: 43 mg/dL     Total Cholesterol: 218 mg/dL*     * - Cholesterol units were assumed for this score calculation  Assessment/Plan:   Vitamin D deficiency. Low Vitamin D level contributes to fatigue and are associated with obesity, breast, and colon cancer. She was given a refill on her Vitamin D, Ergocalciferol, (DRISDOL) 1.25 MG (50000 UNIT)  CAPS capsule every week #4 with 0 refills and will follow-up for routine testing of Vitamin D, at least 2-3 times per year to avoid over-replacement.   Other hyperlipidemia. Cardiovascular risk and specific lipid/LDL goals reviewed.  We discussed several lifestyle modifications today and Keierra will continue to work on diet, exercise and weight loss efforts. Orders and follow up as documented in patient record. She will continue her medications as prescribed.  Counseling Intensive lifestyle modifications are the first line treatment for this issue. . Dietary changes: Increase soluble fiber. Decrease simple carbohydrates. . Exercise changes: Moderate to vigorous-intensity aerobic activity 150 minutes per week if tolerated. . Lipid-lowering medications: see documented in medical record.  Class 3 severe obesity with serious comorbidity and body mass index (BMI) of 40.0 to 44.9 in adult, unspecified obesity type ([place]).  [name] is currently in the action stage of change. As such, her goal is to continue with weight loss efforts. She has agreed to the Category 2 Plan.   Exercise goals: Older adults should follow the adult guidelines. When older adults cannot meet the adult guidelines, they should be as physically active as their abilities and conditions will allow.   Behavioral modification strategies: increasing lean protein intake and no skipping meals.  [name] has agreed to follow-up with our  clinic in 3 weeks. She was informed of the importance of frequent follow-up visits to maximize her success with intensive lifestyle modifications for her multiple health conditions.   Objective:   Blood pressure (!) 156/78, pulse 74, temperature 98.2 F (36.8 C), temperature source Oral, height 5\' 6"  (1.676 m), weight 271 lb (122.9 kg), SpO2 99 %. Body mass index is 43.74 kg/m.  General: Cooperative, alert, well developed, in no acute distress. HEENT: Conjunctivae and lids unremarkable. Cardiovascular:  Regular rhythm.  Lungs: Normal work of breathing. Neurologic: No focal deficits.   Lab Results  Component Value Date   CREATININE 1.2 (A) 06/30/2019   BUN 19 06/30/2019   NA 142 06/30/2019   K 3.9 06/30/2019   CL 105 06/30/2019   CO2 29 (A) 06/30/2019   Lab Results  Component Value Date   ALT 13 06/30/2019   AST 12 (A) 06/30/2019   ALKPHOS 73 09/12/2018   BILITOT 0.3 09/12/2018   Lab Results  Component Value Date   HGBA1C 6.1 06/30/2019   HGBA1C 6.1 (H) 09/12/2018   HGBA1C 6.1 (H) 03/28/2018   HGBA1C 6.8 (H) 05/10/2017   HGBA1C 6.2 (H) 12/14/2016   Lab Results  Component Value Date   INSULIN 19.7 09/12/2018   INSULIN 20.7 03/28/2018   INSULIN 10.5 05/10/2017   INSULIN 20.9 12/14/2016   INSULIN 23.5 08/31/2016   Lab Results  Component Value Date   TSH 1.710 08/31/2016   Lab Results  Component Value Date   CHOL 218 (A) 06/30/2019   HDL 43 09/12/2018   LDLCALC 161 (H) 09/12/2018   TRIG 149 06/30/2019   CHOLHDL 6.4 11/11/2010   Lab Results  Component Value Date   WBC 7.5 06/30/2019   HGB 11.6 (A) 06/30/2019   HCT 35 (A) 06/30/2019   MCV 80.7 08/30/2017   PLT 272 06/30/2019   Lab Results  Component Value Date   IRON 55 12/14/2016   TIBC 329 12/14/2016   FERRITIN 479 (H) 12/14/2016   Obesity Behavioral Intervention Documentation for Insurance:   Approximately 15 minutes were spent on the discussion below.  ASK: We discussed the diagnosis of obesity with [name] today and [name] agreed to give [name] permission to discuss obesity behavioral modification therapy today.  ASSESS: [name] has the diagnosis of obesity and her BMI today is 43.8. [name] is in the action stage of change.   ADVISE: [name] was educated on the multiple health risks of obesity as well as the benefit of weight loss to improve her health. She was advised of the need for long term treatment and the importance of lifestyle modifications to improve her current health and to decrease her risk of  future health problems.  AGREE: Multiple dietary modification options and treatment options were discussed and [name] agreed to follow the recommendations documented in the above note.  ARRANGE: [name] was educated on the importance of frequent visits to treat obesity as outlined per CMS and USPSTF guidelines and agreed to schedule her next follow up appointment today.  Attestation Statements:   Reviewed by clinician on day of visit: allergies, medications, problem list, medical history, surgical history, family history, social history, and previous encounter notes.  IMichaelene Song, am acting as transcriptionist for [name], PA-C   I have reviewed the above documentation for accuracy and completeness, and I agree with the above. [name], PA-C

## 2019-09-15 ENCOUNTER — Other Ambulatory Visit (INDEPENDENT_AMBULATORY_CARE_PROVIDER_SITE_OTHER): Payer: Self-pay | Admitting: Physician Assistant

## 2019-09-15 DIAGNOSIS — I10 Essential (primary) hypertension: Secondary | ICD-10-CM

## 2019-10-09 ENCOUNTER — Encounter (INDEPENDENT_AMBULATORY_CARE_PROVIDER_SITE_OTHER): Payer: Self-pay | Admitting: Physician Assistant

## 2019-10-09 ENCOUNTER — Ambulatory Visit (INDEPENDENT_AMBULATORY_CARE_PROVIDER_SITE_OTHER): Payer: Medicare Other | Admitting: Physician Assistant

## 2019-10-09 ENCOUNTER — Other Ambulatory Visit (INDEPENDENT_AMBULATORY_CARE_PROVIDER_SITE_OTHER): Payer: Self-pay | Admitting: Physician Assistant

## 2019-10-09 ENCOUNTER — Other Ambulatory Visit: Payer: Self-pay

## 2019-10-09 VITALS — BP 144/78 | HR 71 | Temp 98.4°F | Ht 66.0 in | Wt 274.0 lb

## 2019-10-09 DIAGNOSIS — I10 Essential (primary) hypertension: Secondary | ICD-10-CM

## 2019-10-09 DIAGNOSIS — E119 Type 2 diabetes mellitus without complications: Secondary | ICD-10-CM

## 2019-10-09 DIAGNOSIS — E559 Vitamin D deficiency, unspecified: Secondary | ICD-10-CM

## 2019-10-09 DIAGNOSIS — Z6841 Body Mass Index (BMI) 40.0 and over, adult: Secondary | ICD-10-CM

## 2019-10-09 MED ORDER — METFORMIN HCL 500 MG PO TABS: 500.0000 mg | ORAL_TABLET | Freq: Every day | ORAL | 0 refills | Status: DC

## 2019-10-09 MED ORDER — HYDROCHLOROTHIAZIDE 25 MG PO TABS: 25.0000 mg | ORAL_TABLET | Freq: Every day | ORAL | 0 refills | Status: DC

## 2019-10-09 MED ORDER — VITAMIN D (ERGOCALCIFEROL) 1.25 MG (50000 UNIT) PO CAPS: 50000.0000 [IU] | ORAL_CAPSULE | ORAL | 0 refills | Status: DC

## 2019-10-09 NOTE — Progress Notes (Signed)
Chief Complaint:   OBESITY [name] is here to discuss her progress with her obesity treatment plan along with follow-up of her obesity related diagnoses. [name] is on the Category 2 Plan and states she is following her eating plan approximately 80% of the time. [name] states she is exercising 0 minutes 0 times per week.  Today's visit was #: 48 Starting weight: 280 lbs Starting date: 08/31/2016 Today's weight: 274 lbs Today's date: 10/09/2019 Total lbs lost to date: 6 Total lbs lost since last in-office visit: 0  Interim History: [name] reports that she has had a lot of leg pain despite getting an injection in her hip recently. She has a follow-up with Orthopedics in 3 weeks. She has been skipping meals as well as doing some stress eating.  Subjective:   Vitamin D deficiency. [name] is on Vitamin D weekly. No nausea, vomiting, or muscle weakness. Last Vitamin D 51.7 on 09/12/2018.  Essential hypertension. No chest pain. No headache. [name] is on HCTZ and Avapro. Blood pressure is slightly elevated today, but she is in pain.  BP Readings from Last 3 Encounters:  10/09/19 (!) 144/78  09/11/19 (!) 156/78  08/18/19 (!) 149/78   Lab Results  Component Value Date   CREATININE 1.2 (A) 06/30/2019   CREATININE 1.23 (H) 09/12/2018   CREATININE 1.19 (H) 03/28/2018   Type 2 diabetes mellitus without complication, without long-term current use of insulin ([name]). [name] is not checking her blood sugars. She is on metformin. No nausea, vomiting, or diarrhea. No polyphagia.  Lab Results  Component Value Date   HGBA1C 6.1 06/30/2019   HGBA1C 6.1 (H) 09/12/2018   HGBA1C 6.1 (H) 03/28/2018   Lab Results  Component Value Date   LDLCALC 161 (H) 09/12/2018   CREATININE 1.2 (A) 06/30/2019   Lab Results  Component Value Date   INSULIN 19.7 09/12/2018   INSULIN 20.7 03/28/2018   INSULIN 10.5 05/10/2017   INSULIN 20.9 12/14/2016   INSULIN 23.5 08/31/2016   Assessment/Plan:    Vitamin D deficiency. Low Vitamin D level contributes to fatigue and are associated with obesity, breast, and colon cancer. She was given a refill on her Vitamin D, Ergocalciferol, (DRISDOL) 1.25 MG (50000 UNIT) CAPS capsule every week #4 with 0 refills and will follow-up for routine testing of Vitamin D, at least 2-3 times per year to avoid over-replacement.     Essential hypertension. [name] is working on healthy weight loss and exercise to improve blood pressure control. We will watch for signs of hypotension as she continues her lifestyle modifications. She was given a refill on her hydrochlorothiazide (HYDRODIURIL) 25 MG tablet #30 with 0 refills.  Type 2 diabetes mellitus without complication, without long-term current use of insulin ([name]). Good blood sugar control is important to decrease the likelihood of diabetic complications such as nephropathy, neuropathy, limb loss, blindness, coronary artery disease, and death. Intensive lifestyle modification including diet, exercise and weight loss are the first line of treatment for diabetes. [name] was given a refill on her metFORMIN (GLUCOPHAGE) 500 MG tablet #30 with 0 refills.  Class 3 severe obesity with serious comorbidity and body mass index (BMI) of 40.0 to 44.9 in adult, unspecified obesity type ([name]).  [name] is currently in the action stage of change. As such, her goal is to continue with weight loss efforts. She has agreed to the Category 2 Plan.   Exercise goals: Older adults should follow the adult guidelines. When older adults cannot meet the adult guidelines,  they should be as physically active as their abilities and conditions will allow.   Behavioral modification strategies: increasing lean protein intake and no skipping meals.  [name] has agreed to follow-up with our clinic in 3 weeks. She was informed of the importance of frequent follow-up visits to maximize her success with intensive lifestyle modifications for her multiple health  conditions.   Objective:   Blood pressure (!) 144/78, pulse 71, temperature 98.4 F (36.9 C), temperature source Oral, height 5\' 6"  (1.676 m), weight 274 lb (124.3 kg), SpO2 100 %. Body mass index is 44.22 kg/m.  General: Cooperative, alert, well developed, in no acute distress. HEENT: Conjunctivae and lids unremarkable. Cardiovascular: Regular rhythm.  Lungs: Normal work of breathing. Neurologic: No focal deficits.   Lab Results  Component Value Date   CREATININE 1.2 (A) 06/30/2019   BUN 19 06/30/2019   NA 142 06/30/2019   K 3.9 06/30/2019   CL 105 06/30/2019   CO2 29 (A) 06/30/2019   Lab Results  Component Value Date   ALT 13 06/30/2019   AST 12 (A) 06/30/2019   ALKPHOS 73 09/12/2018   BILITOT 0.3 09/12/2018   Lab Results  Component Value Date   HGBA1C 6.1 06/30/2019   HGBA1C 6.1 (H) 09/12/2018   HGBA1C 6.1 (H) 03/28/2018   HGBA1C 6.8 (H) 05/10/2017   HGBA1C 6.2 (H) 12/14/2016   Lab Results  Component Value Date   INSULIN 19.7 09/12/2018   INSULIN 20.7 03/28/2018   INSULIN 10.5 05/10/2017   INSULIN 20.9 12/14/2016   INSULIN 23.5 08/31/2016   Lab Results  Component Value Date   TSH 1.710 08/31/2016   Lab Results  Component Value Date   CHOL 218 (A) 06/30/2019   HDL 43 09/12/2018   LDLCALC 161 (H) 09/12/2018   TRIG 149 06/30/2019   CHOLHDL 6.4 11/11/2010   Lab Results  Component Value Date   WBC 7.5 06/30/2019   HGB 11.6 (A) 06/30/2019   HCT 35 (A) 06/30/2019   MCV 80.7 08/30/2017   PLT 272 06/30/2019   Lab Results  Component Value Date   IRON 55 12/14/2016   TIBC 329 12/14/2016   FERRITIN 479 (H) 12/14/2016   Obesity Behavioral Intervention Documentation for Insurance:   Approximately 15 minutes were spent on the discussion below.  ASK: We discussed the diagnosis of obesity with [name] today and [name] agreed to give [name] permission to discuss obesity behavioral modification therapy today.  ASSESS: [name] has the diagnosis of obesity and  her BMI today is 44.4. [name] is in the action stage of change.   ADVISE: [name] was educated on the multiple health risks of obesity as well as the benefit of weight loss to improve her health. She was advised of the need for long term treatment and the importance of lifestyle modifications to improve her current health and to decrease her risk of future health problems.  AGREE: Multiple dietary modification options and treatment options were discussed and [name] agreed to follow the recommendations documented in the above note.  ARRANGE: [name] was educated on the importance of frequent visits to treat obesity as outlined per CMS and USPSTF guidelines and agreed to schedule her next follow up appointment today.  Attestation Statements:   Reviewed by clinician on day of visit: allergies, medications, problem list, medical history, surgical history, family history, social history, and previous encounter notes.  Migdalia Dk, am acting as transcriptionist for [name], PA-C   I have reviewed the above documentation for accuracy  and completeness, and I agree with the above. [name], PA-C

## 2019-10-25 ENCOUNTER — Ambulatory Visit: Payer: Medicare Other | Attending: Internal Medicine

## 2019-10-25 DIAGNOSIS — Z23 Encounter for immunization: Secondary | ICD-10-CM

## 2019-10-25 NOTE — Progress Notes (Signed)
   Covid-19 Vaccination Clinic  Name:  [name]    MRN: [id number] DOB: 1946/05/29  10/25/2019  [name] was observed post Covid-19 immunization for 15 minutes without incident. She was provided with Vaccine Information Sheet and instruction to access the V-Safe system.   [name] was instructed to call 911 with any severe reactions post vaccine: [name] Difficulty breathing  . Swelling of face and throat  . A fast heartbeat  . A bad rash all over body  . Dizziness and weakness   Immunizations Administered    Name Date Dose VIS Date Route   Pfizer COVID-19 Vaccine 10/25/2019 11:33 AM 0.3 mL 07/11/2019 Intramuscular   Manufacturer: [place]   Lot: [id number]   [place]: [id number]

## 2019-10-30 ENCOUNTER — Other Ambulatory Visit: Payer: Self-pay

## 2019-10-30 ENCOUNTER — Ambulatory Visit (INDEPENDENT_AMBULATORY_CARE_PROVIDER_SITE_OTHER): Payer: Medicare Other | Admitting: Physician Assistant

## 2019-10-30 ENCOUNTER — Other Ambulatory Visit (INDEPENDENT_AMBULATORY_CARE_PROVIDER_SITE_OTHER): Payer: Self-pay | Admitting: Physician Assistant

## 2019-10-30 ENCOUNTER — Encounter (INDEPENDENT_AMBULATORY_CARE_PROVIDER_SITE_OTHER): Payer: Self-pay | Admitting: Physician Assistant

## 2019-10-30 VITALS — BP 156/75 | HR 76 | Temp 99.0°F | Ht 66.0 in | Wt 273.0 lb

## 2019-10-30 DIAGNOSIS — I10 Essential (primary) hypertension: Secondary | ICD-10-CM

## 2019-10-30 DIAGNOSIS — E7849 Other hyperlipidemia: Secondary | ICD-10-CM | POA: Diagnosis not present

## 2019-10-30 DIAGNOSIS — E559 Vitamin D deficiency, unspecified: Secondary | ICD-10-CM

## 2019-10-30 DIAGNOSIS — Z6841 Body Mass Index (BMI) 40.0 and over, adult: Secondary | ICD-10-CM

## 2019-10-30 DIAGNOSIS — E119 Type 2 diabetes mellitus without complications: Secondary | ICD-10-CM | POA: Diagnosis not present

## 2019-10-30 MED ORDER — HYDROCHLOROTHIAZIDE 25 MG PO TABS: 25.0000 mg | ORAL_TABLET | Freq: Every day | ORAL | 0 refills | Status: DC

## 2019-10-30 MED ORDER — VITAMIN D (ERGOCALCIFEROL) 1.25 MG (50000 UNIT) PO CAPS: 50000.0000 [IU] | ORAL_CAPSULE | ORAL | 0 refills | Status: DC

## 2019-10-30 MED ORDER — METFORMIN HCL 500 MG PO TABS: 500.0000 mg | ORAL_TABLET | Freq: Every day | ORAL | 0 refills | Status: AC

## 2019-10-30 NOTE — Progress Notes (Signed)
Chief Complaint:   OBESITY [name] is here to discuss her progress with her obesity treatment plan along with follow-up of her obesity related diagnoses. [name] is on the Category 2 Plan and states she is following her eating plan approximately 80% of the time. [name] states she is exercising 0 minutes 0 times per week.  Today's visit was #: 25 Starting weight: 280 lbs Starting date: 08/31/2016 Today's weight: 273 lbs Today's date: 10/30/2019 Total lbs lost to date: 7 Total lbs lost since last in-office visit: 1  Interim History: [name] states that her husband had a small stroke and she has been going to appointments with him. She has been skipping lunch when she eats a late breakfast.  Subjective:   Vitamin D deficiency. [name] is on prescription Vitamin D. No nausea, vomiting, or muscle weakness. Last Vitamin D 51.7 on 09/12/2018. She is due for labs.  Other hyperlipidemia. [name] is on atorvastatin and Zetia. Last level was not at goal. No chest pain or myalgias.   Lab Results  Component Value Date   CHOL 218 (A) 06/30/2019   HDL 43 09/12/2018   LDLCALC 161 (H) 09/12/2018   TRIG 149 06/30/2019   CHOLHDL 6.4 11/11/2010   Lab Results  Component Value Date   ALT 13 06/30/2019   AST 12 (A) 06/30/2019   ALKPHOS 73 09/12/2018   BILITOT 0.3 09/12/2018   The 10-year ASCVD risk score [name]., et al., 2013) is: 41.7%*   Values used to calculate the score:     Age: 74 years     Sex: Female     Is Non-Hispanic African American: Yes     Diabetic: Yes     Tobacco smoker: No     Systolic Blood Pressure: A999333 mmHg     Is BP treated: Yes     HDL Cholesterol: 43 mg/dL     Total Cholesterol: 218 mg/dL*     * - Cholesterol units were assumed for this score calculation  Type 2 diabetes mellitus without complication, without long-term current use of insulin ([place]). [name] is on metformin daily. No nausea, vomiting, or diarrhea. No polyphagia. She is not checking blood  sugars.   Lab Results  Component Value Date   HGBA1C 6.1 06/30/2019   HGBA1C 6.1 (H) 09/12/2018   HGBA1C 6.1 (H) 03/28/2018   Lab Results  Component Value Date   LDLCALC 161 (H) 09/12/2018   CREATININE 1.2 (A) 06/30/2019   Lab Results  Component Value Date   INSULIN 19.7 09/12/2018   INSULIN 20.7 03/28/2018   INSULIN 10.5 05/10/2017   INSULIN 20.9 12/14/2016   INSULIN 23.5 08/31/2016   Essential hypertension. Blood pressure is elevated today, but she did not take her medications today. [name] is on HCTZ and Avapro.  BP Readings from Last 3 Encounters:  10/30/19 (!) 156/75  10/09/19 (!) 144/78  09/11/19 (!) 156/78   Lab Results  Component Value Date   CREATININE 1.2 (A) 06/30/2019   CREATININE 1.23 (H) 09/12/2018   CREATININE 1.19 (H) 03/28/2018   Assessment/Plan:   Vitamin D deficiency. Low Vitamin D level contributes to fatigue and are associated with obesity, breast, and colon cancer. She was given a refill on her Vitamin D, Ergocalciferol, (DRISDOL) 1.25 MG (50000 UNIT) CAPS capsule every week #4 with 0 refills and will follow-up for routine testing of Vitamin D, at least 2-3 times per year to avoid over-replacement.      Other hyperlipidemia. Cardiovascular risk and specific  lipid/LDL goals reviewed.  We discussed several lifestyle modifications today and [name] will continue to work on diet, exercise and weight loss efforts. Orders and follow up as documented in patient record. She will continue her medications as directed.  Counseling Intensive lifestyle modifications are the first line treatment for this issue.  Dietary changes: Increase soluble fiber. Decrease simple carbohydrates.  Exercise changes: Moderate to vigorous-intensity aerobic activity 150 minutes per week if tolerated.  Lipid-lowering medications: see documented in medical record.  Type 2 diabetes mellitus without complication, without long-term current use of insulin ([name]). Good blood sugar control  is important to decrease the likelihood of diabetic complications such as nephropathy, neuropathy, limb loss, blindness, coronary artery disease, and death. Intensive lifestyle modification including diet, exercise and weight loss are the first line of treatment for diabetes. [name] was given a refill on her metFORMIN (GLUCOPHAGE) 500 MG tablet #90 with 0 refills.  Essential hypertension. [name] is working on healthy weight loss and exercise to improve blood pressure control. We will watch for signs of hypotension as she continues her lifestyle modifications. She was given a refill on her hydrochlorothiazide (HYDRODIURIL) 25 MG tablet #90 with 0 refills.  Class 3 severe obesity with serious comorbidity and body mass index (BMI) of 40.0 to 44.9 in adult, unspecified obesity type ([name]).  [name] is currently in the action stage of change. As such, her goal is to continue with weight loss efforts. She has agreed to the Category 2 Plan.   Exercise goals: Older adults should follow the adult guidelines. When older adults cannot meet the adult guidelines, they should be as physically active as their abilities and conditions will allow.   Behavioral modification strategies: increasing lean protein intake and no skipping meals.  [name] has agreed to follow-up with our clinic in 3 weeks. She was informed of the importance of frequent follow-up visits to maximize her success with intensive lifestyle modifications for her multiple health conditions.   Objective:   Blood pressure (!) 156/75, pulse 76, temperature 99 F (37.2 C), temperature source Oral, height 5\' 6"  (1.676 m), weight 273 lb (123.8 kg), SpO2 100 %. Body mass index is 44.06 kg/m.  General: Cooperative, alert, well developed, in no acute distress. HEENT: Conjunctivae and lids unremarkable. Cardiovascular: Regular rhythm.  Lungs: Normal work of breathing. Neurologic: No focal deficits.   Lab Results  Component Value Date   CREATININE 1.2 (A)  06/30/2019   BUN 19 06/30/2019   NA 142 06/30/2019   K 3.9 06/30/2019   CL 105 06/30/2019   CO2 29 (A) 06/30/2019   Lab Results  Component Value Date   ALT 13 06/30/2019   AST 12 (A) 06/30/2019   ALKPHOS 73 09/12/2018   BILITOT 0.3 09/12/2018   Lab Results  Component Value Date   HGBA1C 6.1 06/30/2019   HGBA1C 6.1 (H) 09/12/2018   HGBA1C 6.1 (H) 03/28/2018   HGBA1C 6.8 (H) 05/10/2017   HGBA1C 6.2 (H) 12/14/2016   Lab Results  Component Value Date   INSULIN 19.7 09/12/2018   INSULIN 20.7 03/28/2018   INSULIN 10.5 05/10/2017   INSULIN 20.9 12/14/2016   INSULIN 23.5 08/31/2016   Lab Results  Component Value Date   TSH 1.710 08/31/2016   Lab Results  Component Value Date   CHOL 218 (A) 06/30/2019   HDL 43 09/12/2018   LDLCALC 161 (H) 09/12/2018   TRIG 149 06/30/2019   CHOLHDL 6.4 11/11/2010   Lab Results  Component Value Date   WBC 7.5  06/30/2019   HGB 11.6 (A) 06/30/2019   HCT 35 (A) 06/30/2019   MCV 80.7 08/30/2017   PLT 272 06/30/2019   Lab Results  Component Value Date   IRON 55 12/14/2016   TIBC 329 12/14/2016   FERRITIN 479 (H) 12/14/2016   Obesity Behavioral Intervention Documentation for Insurance:   Approximately 15 minutes were spent on the discussion below.  ASK: We discussed the diagnosis of obesity with [name] today and [name] agreed to give [name] permission to discuss obesity behavioral modification therapy today.  ASSESS: [name] has the diagnosis of obesity and her BMI today is 44.2. [name] is in the action stage of change.   ADVISE: [name] was educated on the multiple health risks of obesity as well as the benefit of weight loss to improve her health. She was advised of the need for long term treatment and the importance of lifestyle modifications to improve her current health and to decrease her risk of future health problems.  AGREE: Multiple dietary modification options and treatment options were discussed and [name] agreed to follow the  recommendations documented in the above note.  ARRANGE: [name] was educated on the importance of frequent visits to treat obesity as outlined per CMS and USPSTF guidelines and agreed to schedule her next follow up appointment today.  Attestation Statements:   Reviewed by clinician on day of visit: allergies, medications, problem list, medical history, surgical history, family history, social history, and previous encounter notes.  IMichaelene Song, am acting as transcriptionist for [name], PA-C   I have reviewed the above documentation for accuracy and completeness, and I agree with the above. [name], PA-C

## 2019-11-18 ENCOUNTER — Ambulatory Visit: Payer: Medicare Other | Attending: Internal Medicine

## 2019-11-18 DIAGNOSIS — Z23 Encounter for immunization: Secondary | ICD-10-CM

## 2019-11-18 NOTE — Progress Notes (Signed)
   Covid-19 Vaccination Clinic  Name:  [name]    MRN: [id number] DOB: [birth year]  11/18/2019  [name] was observed post Covid-19 immunization for 15 minutes without incident. She was provided with Vaccine Information Sheet and instruction to access the V-Safe system.   [name] was instructed to call 911 with any severe reactions post vaccine: [name] Difficulty breathing  . Swelling of face and throat  . A fast heartbeat  . A bad rash all over body  . Dizziness and weakness   Immunizations Administered    Name Date Dose VIS Date Route   Pfizer COVID-19 Vaccine 11/18/2019 10:30 AM 0.3 mL 09/24/2018 Intramuscular   Manufacturer: [place]   Lot: [id number]   [place]: [id number]

## 2019-11-19 ENCOUNTER — Ambulatory Visit: Payer: Medicare Other | Admitting: Podiatry

## 2019-11-24 ENCOUNTER — Ambulatory Visit (INDEPENDENT_AMBULATORY_CARE_PROVIDER_SITE_OTHER): Payer: Medicare Other | Admitting: Physician Assistant

## 2019-11-24 ENCOUNTER — Other Ambulatory Visit (INDEPENDENT_AMBULATORY_CARE_PROVIDER_SITE_OTHER): Payer: Self-pay | Admitting: Physician Assistant

## 2019-11-24 ENCOUNTER — Other Ambulatory Visit: Payer: Self-pay

## 2019-11-24 ENCOUNTER — Encounter (INDEPENDENT_AMBULATORY_CARE_PROVIDER_SITE_OTHER): Payer: Self-pay | Admitting: Physician Assistant

## 2019-11-24 VITALS — BP 129/63 | HR 71 | Temp 98.4°F | Ht 66.0 in | Wt 273.0 lb

## 2019-11-24 DIAGNOSIS — Z6841 Body Mass Index (BMI) 40.0 and over, adult: Secondary | ICD-10-CM

## 2019-11-24 DIAGNOSIS — I10 Essential (primary) hypertension: Secondary | ICD-10-CM

## 2019-11-24 DIAGNOSIS — E559 Vitamin D deficiency, unspecified: Secondary | ICD-10-CM

## 2019-11-24 MED ORDER — VITAMIN D (ERGOCALCIFEROL) 1.25 MG (50000 UNIT) PO CAPS: 50000.0000 [IU] | ORAL_CAPSULE | ORAL | 0 refills | Status: DC

## 2019-11-24 MED ORDER — HYDROCHLOROTHIAZIDE 25 MG PO TABS: 25.0000 mg | ORAL_TABLET | Freq: Every day | ORAL | 0 refills | Status: AC

## 2019-11-25 NOTE — Progress Notes (Signed)
Chief Complaint:   OBESITY [name] is here to discuss her progress with her obesity treatment plan along with follow-up of her obesity related diagnoses. [name] is on the Category 2 Plan and states she is following her eating plan approximately 80% of the time. [name] states she is exercising 0 minutes 0 times per week.  Today's visit was #: 3 Starting weight: 280 lbs Starting date: 08/31/2016 Today's weight: 273 lbs Today's date: 11/24/2019 Total lbs lost to date: 13 Total lbs lost since last in-office visit: 0  Interim History: [name] reports she has been skipping lunch regularly due to eating a late breakfast. Her hip continues to be painful and she is going to check in with her orthopedist soon.  Subjective:   Vitamin D deficiency. [name] is on prescription Vitamin D. No nausea, vomiting, or muscle weakness. Last Vitamin D 51.7 on 09/12/2018.  Essential hypertension. [name] is on HCTZ and Avapro. No chest pain. Blood pressure is controlled today.  BP Readings from Last 3 Encounters:  11/24/19 129/63  10/30/19 (!) 156/75  10/09/19 (!) 144/78   Lab Results  Component Value Date   CREATININE 1.2 (A) 06/30/2019   CREATININE 1.23 (H) 09/12/2018   CREATININE 1.19 (H) 03/28/2018   Assessment/Plan:   Vitamin D deficiency. Low Vitamin D level contributes to fatigue and are associated with obesity, breast, and colon cancer. She was given a refill on her Vitamin D, Ergocalciferol, (DRISDOL) 1.25 MG (50000 UNIT) CAPS capsule every week #4 with 0 refills and will follow-up for routine testing of Vitamin D, at least 2-3 times per year to avoid over-replacement.     Essential hypertension. [name] is working on healthy weight loss and exercise to improve blood pressure control. We will watch for signs of hypotension as she continues her lifestyle modifications. Refill was given for hydrochlorothiazide (HYDRODIURIL) 25 MG tablet #30 with 0 refills.  Class 3 severe obesity with  serious comorbidity and body mass index (BMI) of 40.0 to 44.9 in adult, unspecified obesity type ([place]).  [name] is currently in the action stage of change. As such, her goal is to continue with weight loss efforts. She has agreed to the Category 2 Plan.   Exercise goals: Older adults should follow the adult guidelines. When older adults cannot meet the adult guidelines, they should be as physically active as their abilities and conditions will allow.   Behavioral modification strategies: no skipping meals and meal planning and cooking strategies.  [name] has agreed to follow-up with our clinic in 3 weeks. She was informed of the importance of frequent follow-up visits to maximize her success with intensive lifestyle modifications for her multiple health conditions.   Objective:   Blood pressure 129/63, pulse 71, temperature 98.4 F (36.9 C), temperature source Oral, height 5\' 6"  (1.676 m), weight 273 lb (123.8 kg), SpO2 97 %. Body mass index is 44.06 kg/m.  General: Cooperative, alert, well developed, in no acute distress. HEENT: Conjunctivae and lids unremarkable. Cardiovascular: Regular rhythm.  Lungs: Normal work of breathing. Neurologic: No focal deficits.   Lab Results  Component Value Date   CREATININE 1.2 (A) 06/30/2019   BUN 19 06/30/2019   NA 142 06/30/2019   K 3.9 06/30/2019   CL 105 06/30/2019   CO2 29 (A) 06/30/2019   Lab Results  Component Value Date   ALT 13 06/30/2019   AST 12 (A) 06/30/2019   ALKPHOS 73 09/12/2018   BILITOT 0.3 09/12/2018   Lab Results  Component Value  Date   HGBA1C 6.1 06/30/2019   HGBA1C 6.1 (H) 09/12/2018   HGBA1C 6.1 (H) 03/28/2018   HGBA1C 6.8 (H) 05/10/2017   HGBA1C 6.2 (H) 12/14/2016   Lab Results  Component Value Date   INSULIN 19.7 09/12/2018   INSULIN 20.7 03/28/2018   INSULIN 10.5 05/10/2017   INSULIN 20.9 12/14/2016   INSULIN 23.5 08/31/2016   Lab Results  Component Value Date   TSH 1.710 08/31/2016   Lab Results    Component Value Date   CHOL 218 (A) 06/30/2019   HDL 43 09/12/2018   LDLCALC 161 (H) 09/12/2018   TRIG 149 06/30/2019   CHOLHDL 6.4 11/11/2010   Lab Results  Component Value Date   WBC 7.5 06/30/2019   HGB 11.6 (A) 06/30/2019   HCT 35 (A) 06/30/2019   MCV 80.7 08/30/2017   PLT 272 06/30/2019   Lab Results  Component Value Date   IRON 55 12/14/2016   TIBC 329 12/14/2016   FERRITIN 479 (H) 12/14/2016   Obesity Behavioral Intervention Documentation for Insurance:   Approximately 15 minutes were spent on the discussion below.  ASK: We discussed the diagnosis of obesity with [name] today and [name] agreed to give [name] permission to discuss obesity behavioral modification therapy today.  ASSESS: [name] has the diagnosis of obesity and her BMI today is 44.2. [name] is in the action stage of change.   ADVISE: [name] was educated on the multiple health risks of obesity as well as the benefit of weight loss to improve her health. She was advised of the need for long term treatment and the importance of lifestyle modifications to improve her current health and to decrease her risk of future health problems.  AGREE: Multiple dietary modification options and treatment options were discussed and [name] agreed to follow the recommendations documented in the above note.  ARRANGE: [name] was educated on the importance of frequent visits to treat obesity as outlined per CMS and USPSTF guidelines and agreed to schedule her next follow up appointment today.  Attestation Statements:   Reviewed by clinician on day of visit: allergies, medications, problem list, medical history, surgical history, family history, social history, and previous encounter notes.  IMichaelene Song, am acting as transcriptionist for [name], PA-C   I have reviewed the above documentation for accuracy and completeness, and I agree with the above. [name], PA-C

## 2019-11-26 ENCOUNTER — Other Ambulatory Visit (INDEPENDENT_AMBULATORY_CARE_PROVIDER_SITE_OTHER): Payer: Self-pay | Admitting: Physician Assistant

## 2019-11-26 DIAGNOSIS — I10 Essential (primary) hypertension: Secondary | ICD-10-CM

## 2019-12-05 ENCOUNTER — Other Ambulatory Visit: Payer: Self-pay | Admitting: Neurosurgery

## 2019-12-05 DIAGNOSIS — M79604 Pain in right leg: Secondary | ICD-10-CM

## 2019-12-08 ENCOUNTER — Ambulatory Visit (INDEPENDENT_AMBULATORY_CARE_PROVIDER_SITE_OTHER): Payer: Medicare Other | Admitting: Podiatry

## 2019-12-08 ENCOUNTER — Other Ambulatory Visit: Payer: Self-pay

## 2019-12-08 DIAGNOSIS — M659 Synovitis and tenosynovitis, unspecified: Secondary | ICD-10-CM

## 2019-12-08 DIAGNOSIS — M79676 Pain in unspecified toe(s): Secondary | ICD-10-CM | POA: Diagnosis not present

## 2019-12-08 DIAGNOSIS — E0843 Diabetes mellitus due to underlying condition with diabetic autonomic (poly)neuropathy: Secondary | ICD-10-CM

## 2019-12-08 DIAGNOSIS — B351 Tinea unguium: Secondary | ICD-10-CM | POA: Diagnosis not present

## 2019-12-08 DIAGNOSIS — G629 Polyneuropathy, unspecified: Secondary | ICD-10-CM

## 2019-12-11 NOTE — Progress Notes (Signed)
   SUBJECTIVE Patient with a history of diabetes mellitus presents to office today complaining of elongated, thickened nails that cause pain while ambulating in shoes. She is unable to trim her own nails.  She is also here for follow up evaluation of ankle pain and peripheral neuropathy bilaterally. She reports a flare up of right ankle pain that began a few weeks ago. She has not done anything at home for treatment but reports relief after the injections she has had in the past. Walking and being on the foot for long periods of time increases the pain. Patient is here for further evaluation and treatment.   Past Medical History:  Diagnosis Date  . Anemia   . Anxiety    takes Xanax daily  . Arthritis   . Back pain   . Blood transfusion   . Breast cancer ([place]) 2013   left breast  . Bronchitis    hx of  . Chronic fatigue syndrome   . Depression    takes Lexapro daily  . Diabetes mellitus without complication ([place])    boderline  . Diverticulosis   . Edema    feet and legs  . Headache(784.0)   . History of colon polyps 1998   adenomatous  . Hyperlipidemia    takes Niacin daily  . Hypertension    takes Verapamil and Avapro daily  . Joint pain   . OSA (obstructive sleep apnea)   . Personal history of radiation therapy   . Pneumonia 4/12   Albuterol daily as needed  . Radiation 10/29/11-11/24/11   left breast 6100 cGy  . Shortness of breath    with exertion  . Spinal stenosis   . Tinnitus     OBJECTIVE General Patient is awake, alert, and oriented x 3 and in no acute distress. Derm Skin is dry and supple bilateral. Negative open lesions or macerations. Remaining integument unremarkable. Nails are tender, long, thickened and dystrophic with subungual debris, consistent with onychomycosis, 1-5 bilateral. No signs of infection noted. Vasc  DP and PT pedal pulses palpable bilaterally. Temperature gradient within normal limits.  Neuro Epicritic and protective threshold sensation  diminished bilaterally.  Musculoskeletal Exam Pain with palpation noted to the medial, anterior and lateral aspects of the bilateral ankle joints. No symptomatic pedal deformities noted bilateral. Muscular strength within normal limits.  ASSESSMENT 1. Diabetes Mellitus w/ peripheral neuropathy 2. Onychomycosis of nail due to dermatophyte bilateral 3. Synovitis / DJD bilateral ankles 4. Peripheral polyneuropathy BLE  PLAN OF CARE 1. Patient evaluated today. 2. Instructed to maintain good pedal hygiene and foot care. Stressed importance of controlling blood sugar.  3. Mechanical debridement of nails 1-5 bilaterally performed using a nail nipper. Filed with dremel without incident.  4. Injection of 0.5 mLs Celestone Soluspan injected into the right ankle joint.  5. Return to clinic in 3 mos.     Edrick Kins, DPM [facility]  Dr. Edrick Kins, [address]                                        [place], [place] [address]                Office [phone]  Fax [phone]

## 2019-12-15 ENCOUNTER — Ambulatory Visit (INDEPENDENT_AMBULATORY_CARE_PROVIDER_SITE_OTHER): Payer: Medicare Other | Admitting: Physician Assistant

## 2020-01-01 ENCOUNTER — Ambulatory Visit
Admission: RE | Admit: 2020-01-01 | Discharge: 2020-01-01 | Disposition: A | Discharge status: Home / Self Care | Payer: Medicare Other | Source: Ambulatory Visit | Attending: Neurosurgery | Admitting: Neurosurgery

## 2020-01-01 ENCOUNTER — Other Ambulatory Visit (INDEPENDENT_AMBULATORY_CARE_PROVIDER_SITE_OTHER): Payer: Self-pay | Admitting: Physician Assistant

## 2020-01-01 ENCOUNTER — Other Ambulatory Visit: Payer: Self-pay

## 2020-01-01 DIAGNOSIS — M79604 Pain in right leg: Secondary | ICD-10-CM

## 2020-01-01 DIAGNOSIS — E559 Vitamin D deficiency, unspecified: Secondary | ICD-10-CM

## 2020-01-01 MED ORDER — GADOBENATE DIMEGLUMINE 529 MG/ML IV SOLN
20.0000 mL | Freq: Once | INTRAVENOUS | Status: AC | PRN
  Administered 2020-01-01: 20 mL via INTRAVENOUS

## 2020-01-06 ENCOUNTER — Other Ambulatory Visit: Payer: Self-pay

## 2020-01-06 ENCOUNTER — Other Ambulatory Visit (INDEPENDENT_AMBULATORY_CARE_PROVIDER_SITE_OTHER): Payer: Self-pay | Admitting: Physician Assistant

## 2020-01-06 ENCOUNTER — Encounter (INDEPENDENT_AMBULATORY_CARE_PROVIDER_SITE_OTHER): Payer: Self-pay | Admitting: Physician Assistant

## 2020-01-06 ENCOUNTER — Ambulatory Visit (INDEPENDENT_AMBULATORY_CARE_PROVIDER_SITE_OTHER): Payer: Medicare Other | Admitting: Physician Assistant

## 2020-01-06 VITALS — BP 150/79 | HR 70 | Temp 98.8°F | Ht 66.0 in | Wt 277.0 lb

## 2020-01-06 DIAGNOSIS — E119 Type 2 diabetes mellitus without complications: Secondary | ICD-10-CM | POA: Diagnosis not present

## 2020-01-06 DIAGNOSIS — E559 Vitamin D deficiency, unspecified: Secondary | ICD-10-CM

## 2020-01-06 DIAGNOSIS — E66813 Obesity, class 3: Secondary | ICD-10-CM

## 2020-01-06 DIAGNOSIS — Z6841 Body Mass Index (BMI) 40.0 and over, adult: Secondary | ICD-10-CM | POA: Diagnosis not present

## 2020-01-06 MED ORDER — VITAMIN D (ERGOCALCIFEROL) 1.25 MG (50000 UNIT) PO CAPS: 50000.0000 [IU] | ORAL_CAPSULE | ORAL | 0 refills | Status: DC

## 2020-01-06 NOTE — Progress Notes (Signed)
Chief Complaint:   OBESITY [name] is here to discuss her progress with her obesity treatment plan along with follow-up of her obesity related diagnoses. [name] is on the Category 2 Plan and states she is following her eating plan approximately 0% of the time. [name] states she is exercising 0 minutes 0 times per week.  Today's visit was #: 57 Starting weight: 280 lbs Starting date: 08/31/2017 Today's weight: 277 lbs Today's date: 01/06/2020 Total lbs lost to date: 3 Total lbs lost since last in-office visit: 0  Interim History: [name] reports she continues to skip lunch most days. Her husband brings home fried fish and other choices that aren't on her plan.  Subjective:   Vitamin D deficiency. [name] is on prescription Vitamin D supplementation. No nausea, vomiting, or muscle weakness. Last Vitamin D 51.7 on 09/12/2018.  Type 2 diabetes mellitus ([place]). [name] is on metformin. No nausea, vomiting, or diarrhea. Fasting blood sugars range between 110 and 130.  Lab Results  Component Value Date   HGBA1C 6.1 06/30/2019   HGBA1C 6.1 (H) 09/12/2018   HGBA1C 6.1 (H) 03/28/2018   Lab Results  Component Value Date   LDLCALC 161 (H) 09/12/2018   CREATININE 1.2 (A) 06/30/2019   Lab Results  Component Value Date   INSULIN 19.7 09/12/2018   INSULIN 20.7 03/28/2018   INSULIN 10.5 05/10/2017   INSULIN 20.9 12/14/2016   INSULIN 23.5 08/31/2016   Assessment/Plan:   Vitamin D deficiency. Low Vitamin D level contributes to fatigue and are associated with obesity, breast, and colon cancer. She was given a refill on her Vitamin D, Ergocalciferol, (DRISDOL) 1.25 MG (50000 UNIT) CAPS capsule every week #4 with 0 refills and will follow-up for routine testing of Vitamin D, at least 2-3 times per year to avoid over-replacement.   Type 2 diabetes mellitus ([place]). Good blood sugar control is important to decrease the likelihood of diabetic complications such as nephropathy, neuropathy, limb  loss, blindness, coronary artery disease, and death. Intensive lifestyle modification including diet, exercise and weight loss are the first line of treatment for diabetes. [name] will continue her medication as directed.  Class 3 severe obesity with serious comorbidity and body mass index (BMI) of 40.0 to 44.9 in adult, unspecified obesity type ([name]).  [name] is currently in the action stage of change. As such, her goal is to continue with weight loss efforts. She has agreed to the Category 2 Plan. She will eat lunch portion for breakfast.  Exercise goals: Older adults should follow the adult guidelines. When older adults cannot meet the adult guidelines, they should be as physically active as their abilities and conditions will allow.   Behavioral modification strategies: increasing lean protein intake and no skipping meals.  [name] will call the office to schedule a return appointment. She was informed of the importance of frequent follow-up visits to maximize her success with intensive lifestyle modifications for her multiple health conditions.   Objective:   Blood pressure (!) 150/79, pulse 70, temperature 98.8 F (37.1 C), temperature source Oral, height 5\' 6"  (1.676 m), weight 277 lb (125.6 kg), SpO2 99 %. Body mass index is 44.71 kg/m.  General: Cooperative, alert, well developed, in no acute distress. HEENT: Conjunctivae and lids unremarkable. Cardiovascular: Regular rhythm.  Lungs: Normal work of breathing. Neurologic: No focal deficits.   Lab Results  Component Value Date   CREATININE 1.2 (A) 06/30/2019   BUN 19 06/30/2019   NA 142 06/30/2019   K 3.9 06/30/2019  CL 105 06/30/2019   CO2 29 (A) 06/30/2019   Lab Results  Component Value Date   ALT 13 06/30/2019   AST 12 (A) 06/30/2019   ALKPHOS 73 09/12/2018   BILITOT 0.3 09/12/2018   Lab Results  Component Value Date   HGBA1C 6.1 06/30/2019   HGBA1C 6.1 (H) 09/12/2018   HGBA1C 6.1 (H) 03/28/2018   HGBA1C 6.8 (H)  05/10/2017   HGBA1C 6.2 (H) 12/14/2016   Lab Results  Component Value Date   INSULIN 19.7 09/12/2018   INSULIN 20.7 03/28/2018   INSULIN 10.5 05/10/2017   INSULIN 20.9 12/14/2016   INSULIN 23.5 08/31/2016   Lab Results  Component Value Date   TSH 1.710 08/31/2016   Lab Results  Component Value Date   CHOL 218 (A) 06/30/2019   HDL 43 09/12/2018   LDLCALC 161 (H) 09/12/2018   TRIG 149 06/30/2019   CHOLHDL 6.4 11/11/2010   Lab Results  Component Value Date   WBC 7.5 06/30/2019   HGB 11.6 (A) 06/30/2019   HCT 35 (A) 06/30/2019   MCV 80.7 08/30/2017   PLT 272 06/30/2019   Lab Results  Component Value Date   IRON 55 12/14/2016   TIBC 329 12/14/2016   FERRITIN 479 (H) 12/14/2016   Obesity Behavioral Intervention Documentation for Insurance:   Approximately 15 minutes were spent on the discussion below.  ASK: We discussed the diagnosis of obesity with [name] today and [name] agreed to give [name] permission to discuss obesity behavioral modification therapy today.  ASSESS: [name] has the diagnosis of obesity and her BMI today is 44.8. [name] is in the action stage of change.   ADVISE: [name] was educated on the multiple health risks of obesity as well as the benefit of weight loss to improve her health. She was advised of the need for long term treatment and the importance of lifestyle modifications to improve her current health and to decrease her risk of future health problems.  AGREE: Multiple dietary modification options and treatment options were discussed and [name] agreed to follow the recommendations documented in the above note.  ARRANGE: [name] was educated on the importance of frequent visits to treat obesity as outlined per CMS and USPSTF guidelines and agreed to schedule her next follow up appointment today.  Attestation Statements:   Reviewed by clinician on day of visit: allergies, medications, problem list, medical history, surgical history, family history,  social history, and previous encounter notes.  IMichaelene Song, am acting as transcriptionist for [name] Potash, PA-C   I have reviewed the above documentation for accuracy and completeness, and I agree with the above. -  .mec

## 2020-02-03 ENCOUNTER — Other Ambulatory Visit (INDEPENDENT_AMBULATORY_CARE_PROVIDER_SITE_OTHER): Payer: Self-pay | Admitting: Physician Assistant

## 2020-02-03 DIAGNOSIS — E559 Vitamin D deficiency, unspecified: Secondary | ICD-10-CM

## 2020-03-10 ENCOUNTER — Ambulatory Visit: Payer: Medicare Other | Admitting: Podiatry

## 2020-03-11 ENCOUNTER — Other Ambulatory Visit: Payer: Self-pay | Admitting: Neurosurgery

## 2020-03-17 ENCOUNTER — Other Ambulatory Visit (INDEPENDENT_AMBULATORY_CARE_PROVIDER_SITE_OTHER): Payer: Self-pay | Admitting: Physician Assistant

## 2020-03-17 DIAGNOSIS — I10 Essential (primary) hypertension: Secondary | ICD-10-CM

## 2020-04-07 ENCOUNTER — Other Ambulatory Visit: Payer: Self-pay | Admitting: Neurosurgery

## 2020-04-15 NOTE — Progress Notes (Signed)
[facility] - [place], [address] AT [address] [place] [address] Phone: [phone] Fax: [phone]      Your procedure is scheduled on September 20  Report to [facility] Main Entrance "A" at 1030 A.M., and check in at the Admitting office.  Call this number if you have problems the morning of surgery:  [phone]  Call [phone] if you have any questions prior to your surgery date Monday-Friday 8am-4pm    Remember:  Do not eat or drink after midnight the night before your surgery     Take these medicines the morning of surgery with A SIP OF WATER  amitriptyline (ELAVIL) atorvastatin (LIPITOR) escitalopram (LEXAPRO) ezetimibe (ZETIA)   As of today, STOP taking any Aspirin (unless otherwise instructed by your surgeon) Aleve, Naproxen, Ibuprofen, Motrin, Advil, Goody's, BC's, all herbal medications, fish oil, and all vitamins.   WHAT DO I DO ABOUT MY DIABETES MEDICATION?   [name] Do not take oral diabetes medicines (pills) the morning of surgery. metFORMIN (GLUCOPHAGE)   HOW TO MANAGE YOUR DIABETES BEFORE AND AFTER SURGERY  Why is it important to control my blood sugar before and after surgery? . Improving blood sugar levels before and after surgery helps healing and can limit problems. . A way of improving blood sugar control is eating a healthy diet by: o  Eating less sugar and carbohydrates o  Increasing activity/exercise o  Talking with your doctor about reaching your blood sugar goals . High blood sugars (greater than 180 mg/dL) can raise your risk of infections and slow your recovery, so you will need to focus on controlling your diabetes during the weeks before surgery. . Make sure that the doctor who takes care of your diabetes knows about your planned surgery including the date and location.  How do I manage my blood sugar before surgery? . Check your blood sugar at least 4  times a day, starting 2 days before surgery, to make sure that the level is not too high or low. . Check your blood sugar the morning of your surgery when you wake up and every 2 hours until you get to the Short Stay unit. o If your blood sugar is less than 70 mg/dL, you will need to treat for low blood sugar: - Do not take insulin. - Treat a low blood sugar (less than 70 mg/dL) with  cup of clear juice (cranberry or apple), 4 glucose tablets, OR glucose gel. - Recheck blood sugar in 15 minutes after treatment (to make sure it is greater than 70 mg/dL). If your blood sugar is not greater than 70 mg/dL on recheck, call [phone] for further instructions. . Report your blood sugar to the short stay nurse when you get to Short Stay.  . If you are admitted to the hospital after surgery: o Your blood sugar will be checked by the staff and you will probably be given insulin after surgery (instead of oral diabetes medicines) to make sure you have good blood sugar levels. o The goal for blood sugar control after surgery is 80-180 mg/dL.                      Do not wear jewelry, make up, or nail polish            Do not wear lotions, powders, perfumes, or deodorant.            Do not  shave 48 hours prior to surgery.              Do not bring valuables to the hospital.            [facility] is not responsible for any belongings or valuables.  Do NOT Smoke (Tobacco/Vaping) or drink Alcohol 24 hours prior to your procedure If you use a CPAP at night, you may bring all equipment for your overnight stay.   Contacts, glasses, dentures or bridgework may not be worn into surgery.      For patients admitted to the hospital, discharge time will be determined by your treatment team.   Patients discharged the day of surgery will not be allowed to drive home, and someone needs to stay with them for 24 hours.    Special instructions:   [facility]- Preparing For Surgery  Before surgery, you can play  an important role. Because skin is not sterile, your skin needs to be as free of germs as possible. You can reduce the number of germs on your skin by washing with CHG (chlorahexidine gluconate) Soap before surgery.  CHG is an antiseptic cleaner which kills germs and bonds with the skin to continue killing germs even after washing.    Oral Hygiene is also important to reduce your risk of infection.  Remember - BRUSH YOUR TEETH THE MORNING OF SURGERY WITH YOUR REGULAR TOOTHPASTE  Please do not use if you have an allergy to CHG or antibacterial soaps. If your skin becomes reddened/irritated stop using the CHG.  Do not shave (including legs and underarms) for at least 48 hours prior to first CHG shower. It is OK to shave your face.  Please follow these instructions carefully.   1. Shower the NIGHT BEFORE SURGERY and the MORNING OF SURGERY with CHG Soap.   2. If you chose to wash your hair, wash your hair first as usual with your normal shampoo.  3. After you shampoo, rinse your hair and body thoroughly to remove the shampoo.  4. Use CHG as you would any other liquid soap. You can apply CHG directly to the skin and wash gently with a scrungie or a clean washcloth.   5. Apply the CHG Soap to your body ONLY FROM THE NECK DOWN.  Do not use on open wounds or open sores. Avoid contact with your eyes, ears, mouth and genitals (private parts). Wash Face and genitals (private parts)  with your normal soap.   6. Wash thoroughly, paying special attention to the area where your surgery will be performed.  7. Thoroughly rinse your body with warm water from the neck down.  8. DO NOT shower/wash with your normal soap after using and rinsing off the CHG Soap.  9. Pat yourself dry with a CLEAN TOWEL.  10. Wear CLEAN PAJAMAS to bed the night before surgery  11. Place CLEAN SHEETS on your bed the night of your first shower and DO NOT SLEEP WITH PETS.   Day of Surgery: Wear Clean/Comfortable clothing the  morning of surgery Do not apply any deodorants/lotions.   Remember to brush your teeth WITH YOUR REGULAR TOOTHPASTE.   Please read over the following fact sheets that you were given.

## 2020-04-16 ENCOUNTER — Encounter (HOSPITAL_COMMUNITY): Payer: Self-pay

## 2020-04-16 ENCOUNTER — Encounter (HOSPITAL_COMMUNITY)
Admission: RE | Admit: 2020-04-16 | Discharge: 2020-04-16 | Disposition: A | Discharge status: Home / Self Care | Payer: Medicare Other | Source: Ambulatory Visit | Attending: Neurosurgery | Admitting: Neurosurgery

## 2020-04-16 ENCOUNTER — Other Ambulatory Visit (HOSPITAL_COMMUNITY)
Admission: RE | Admit: 2020-04-16 | Discharge: 2020-04-16 | Disposition: A | Discharge status: Home / Self Care | Payer: Medicare Other | Source: Ambulatory Visit | Attending: Neurosurgery | Admitting: Neurosurgery

## 2020-04-16 ENCOUNTER — Other Ambulatory Visit: Payer: Self-pay

## 2020-04-16 DIAGNOSIS — Z20822 Contact with and (suspected) exposure to covid-19: Secondary | ICD-10-CM | POA: Insufficient documentation

## 2020-04-16 DIAGNOSIS — Z01812 Encounter for preprocedural laboratory examination: Secondary | ICD-10-CM | POA: Insufficient documentation

## 2020-04-16 DIAGNOSIS — R7303 Prediabetes: Secondary | ICD-10-CM | POA: Insufficient documentation

## 2020-04-16 LAB — TYPE AND SCREEN
ABO/RH(D): A POS
Antibody Screen: NEGATIVE

## 2020-04-16 LAB — BASIC METABOLIC PANEL
Anion gap: 11 (ref 5–15)
BUN: 17 mg/dL (ref 8–23)
CO2: 23 mmol/L (ref 22–32)
Calcium: 10.3 mg/dL (ref 8.9–10.3)
Chloride: 106 mmol/L (ref 98–111)
Creatinine, Ser: 1.5 mg/dL — ABNORMAL HIGH (ref 0.44–1.00)
GFR calc Af Amer: 39 mL/min — ABNORMAL LOW (ref >60)
GFR calc non Af Amer: 33 mL/min — ABNORMAL LOW (ref >60)
Glucose, Bld: 105 mg/dL — ABNORMAL HIGH (ref 70–99)
Potassium: 3.6 mmol/L (ref 3.5–5.1)
Sodium: 140 mmol/L (ref 135–145)

## 2020-04-16 LAB — CBC
HCT: 36.3 % (ref 36.0–46.0)
Hemoglobin: 11.4 g/dL — ABNORMAL LOW (ref 12.0–15.0)
MCH: 25.1 pg — ABNORMAL LOW (ref 26.0–34.0)
MCHC: 31.4 g/dL (ref 30.0–36.0)
MCV: 80 fL (ref 80.0–100.0)
Platelets: 275 10*3/uL (ref 150–400)
RBC: 4.54 MIL/uL (ref 3.87–5.11)
RDW: 16.3 % — ABNORMAL HIGH (ref 11.5–15.5)
WBC: 6.5 10*3/uL (ref 4.0–10.5)
nRBC: 0 % (ref 0.0–0.2)

## 2020-04-16 LAB — SURGICAL PCR SCREEN
MRSA, PCR: NEGATIVE
Staphylococcus aureus: NEGATIVE

## 2020-04-16 LAB — GLUCOSE, CAPILLARY: Glucose-Capillary: 112 mg/dL — ABNORMAL HIGH (ref 70–99)

## 2020-04-16 LAB — SARS CORONAVIRUS 2 (TAT 6-24 HRS): SARS Coronavirus 2: NEGATIVE

## 2020-04-16 LAB — HEMOGLOBIN A1C
Hgb A1c MFr Bld: 6.2 % — ABNORMAL HIGH (ref 4.8–5.6)
Mean Plasma Glucose: 131.24 mg/dL

## 2020-04-16 MED ORDER — DEXTROSE 5 % IV SOLN
3.0000 g | INTRAVENOUS | Status: AC
  Administered 2020-04-19: 3 g via INTRAVENOUS
  Filled 2020-04-16: qty 3

## 2020-04-16 NOTE — Progress Notes (Signed)
PCP - Dr. Shirline Frees [facility] ) Cardiologist - denies  PPM/ICD - denies  Chest x-ray - N/A EKG - per patient done June 2021 -tracing requested Stress Test - per patient more than 10 years ago ECHO - 09/04/16 Cardiac Cath - denies  Sleep Study - Yes, per patient wears CPAP every night  Fasting Blood Sugar - 110 - 140s Checks Blood Sugar once every 1-2 weeks   Blood Thinner Instructions: N/A Aspirin Instructions: per patient last dose taken 04/14/2020  ERAS Protcol - No  COVID TEST- Scheduled for 04/16/2020. Patient verbalized understanding of self-quarantine instructions, appointment time and place.  Anesthesia review:  YES, records requested.  Patient denies shortness of breath, fever, cough and chest pain at PAT appointment  All instructions explained to the patient, with a verbal understanding of the material. Patient agrees to go over the instructions while at home for a better understanding. Patient also instructed to self quarantine after being tested for COVID-19. The opportunity to ask questions was provided.

## 2020-04-16 NOTE — Progress Notes (Signed)
Anesthesia Chart Review:  Case: [id number] Date/Time: 04/19/20 1218   Procedure: POSTERIOR LUMBAR INTERBODY FUSION, INTERBODY PROSTHESIS, POSTERIOR INSTRUMENTATION LUMBAR 2 - LUMBAR 3; EXPLORE FUSION (N/A ) - 3C   Anesthesia type: General   Pre-op diagnosis: LUMBAR ADJACENT SEGMENT DISEASE WITH SPONDYLOLISTHEAIA   Location: [facility] ROOM 18 / [facility]   Surgeons: Jenkins, Jeffrey, MD      DISCUSSION: Patient is a 74 year old female scheduled for the above procedure.  History includes never smoker, HTN, HLD, borderline DM, OSA (CPAP), anemia, tinnitus, edema, chronic fatigue syndrome, left breast cancer (s/p left breast lumpectomy 08/30/11; s/p right breast lumpectomy for benign stromal calcifications 02/06/13), pituitary adenoma (s/p transseptal transphenoidal resection 01/25/10), TKA (left 08/19/04, right 05/24/05), back surgery (L3-5 PLIF 12/11/14; removal of L5 left pedicle screw 04/20/15). BMI is consistent with morbid obesity.  Last evaluation by PCP Dr. Harris on 04/14/20 for leg pain. He was aware of surgery plans. She received Fluzone high dose then. EKG done in June was done for occasional palpitations and showed SR, old anterior infarct. Has had Q waves in III and aVF dating back to at least 2014.    Reported last aspirin 04/14/2020.  2nd Pfizer COVID-19 vaccine 11/18/19. 04/16/20 presurgical COVID-19 test is in process. Anesthesia team to evaluate on the day of surgery.    VS: BP (!) 147/85   Pulse 74   Temp 36.8 C (Oral)   Resp 17   Ht 5' 6" (1.676 m)   Wt 127 kg   SpO2 98%   BMI 45.18 kg/m    PROVIDERS: Harris, William, MD is PCP ([facility]) - Sood, Vineet, MD is pulmonologist ([facility]). Last visit 05/26/19 with one year follow-up recommended. - Magrinat, Gustav, MD is HEM-ONC - She is also followed at the [facility], last visit 01/06/20 by Aguilar, Tracey, PA-C   LABS: Labs reviewed: Acceptable for surgery. Cr 1.50, previously has been ~  1.2-1.25 range since ~ 08/2018. Cr was 1.22 and A1c 6.0% on 01/21/20 at [facility].  (all labs ordered are listed, but only abnormal results are displayed)  Labs Reviewed  GLUCOSE, CAPILLARY - Abnormal; Notable for the following components:      Result Value   Glucose-Capillary 112 (*)    All other components within normal limits  HEMOGLOBIN A1C - Abnormal; Notable for the following components:   Hgb A1c MFr Bld 6.2 (*)    All other components within normal limits  BASIC METABOLIC PANEL - Abnormal; Notable for the following components:   Glucose, Bld 105 (*)    Creatinine, Ser 1.50 (*)    GFR calc non Af Amer 33 (*)    GFR calc Af Amer 39 (*)    All other components within normal limits  CBC - Abnormal; Notable for the following components:   Hemoglobin 11.4 (*)    MCH 25.1 (*)    RDW 16.3 (*)    All other components within normal limits  SURGICAL PCR SCREEN  TYPE AND SCREEN     IMAGES: MRI L-spine 01/01/20: IMPRESSION: - L5 sacralization. Multilevel spondylosis and sequela of L3-5 posterior decompression and fusion. - Bilateral L2-3 foraminal protrusions with moderate spinal canal, severe right and moderate left neural foraminal narrowing, new since prior exam.   EKG:  EKG 01/21/20 ([facility]):  Sinus rhythm Old anterior infarct - Overall, EKG appear stable when compared to 08/23/17 tracing. Also she has had Q waves in III and aVF dating   back to at least 02/10/13.     CV: Echo 09/04/16 [facility]; ordered for evaluation of dyspnea): Conclusion: 1. Moderate concentric LVH with normal global wall motion. Doppler evidence of grade 1 (impaired) diastolic dysfunction. Estimated EF 60%. 2. Left atrial cavity is moderately dilated. 3. Trace mitral, tricuspid, and pulmonic regurgitation.  US Carotid 08/04/11: IMPRESSION:  - Mild atherosclerotic disease involving the carotid arteries.  Estimated degree of stenosis in the internal carotid arteries is  less than  50% bilaterally.  - Patent vertebral arteries.    She reported a stress test > 10 years ago.   Past Medical History:  Diagnosis Date  . Anemia   . Anxiety    takes Xanax daily  . Arthritis   . Back pain   . Blood transfusion   . Breast cancer ([place]) 2013   left breast  . Bronchitis    hx of  . Chronic fatigue syndrome   . Depression    takes Lexapro daily  . Diabetes mellitus without complication ([place])    boderline  . Diverticulosis   . Edema    feet and legs  . Headache(784.0)   . History of colon polyps 1998   adenomatous  . Hyperlipidemia    takes Niacin daily  . Hypertension    takes Verapamil and Avapro daily  . Joint pain   . OSA (obstructive sleep apnea)   . Personal history of radiation therapy   . Pneumonia 4/12   Albuterol daily as needed  . Radiation 10/29/11-11/24/11   left breast 6100 cGy  . Shortness of breath    with exertion  . Spinal stenosis   . Tinnitus     Past Surgical History:  Procedure Laterality Date  . APPENDECTOMY  1978  . BACK SURGERY    . BELPHAROPTOSIS REPAIR    . BREAST EXCISIONAL BIOPSY Right 02/06/2013  . BREAST LUMPECTOMY  08/2011   left  . BREAST LUMPECTOMY WITH NEEDLE LOCALIZATION Right 02/06/2013   Procedure: RIGHT BREAST LUMPECTOMY WITH NEEDLE LOCALIZATION;  Surgeon: Harl Bowie, MD;  Location: [place];  Service: General;  Laterality: Right;  . COLONOSCOPY W/ POLYPECTOMY    . COLONOSCOPY WITH PROPOFOL N/A 10/27/2015   Procedure: COLONOSCOPY WITH PROPOFOL;  Surgeon: Mauri Pole, MD;  Location: [place];  Service: Endoscopy;  Laterality: N/A;  . HARDWARE REMOVAL Left 04/20/2015   Procedure: HARDWARE REMOVAL LEFT LUMBAR FIVE SCREW;  Surgeon: Karie Chimera, MD;  Location: [place];  Service: Neurosurgery;  Laterality: Left;  . JOINT REPLACEMENT Bilateral    bilateral knee  . POLYPECTOMY  1990's  . THYROID CYST EXCISION  1994  . TOTAL KNEE ARTHROPLASTY  2006   bilateral  . TRANSPHENOIDAL / TRANSNASAL  HYPOPHYSECTOMY / RESECTION PITUITARY TUMOR  6/11    MEDICATIONS: . acetaminophen (TYLENOL) 500 MG tablet  . amitriptyline (ELAVIL) 25 MG tablet  . aspirin 81 MG tablet  . atorvastatin (LIPITOR) 20 MG tablet  . blood glucose meter kit and supplies KIT  . escitalopram (LEXAPRO) 20 MG tablet  . ezetimibe (ZETIA) 10 MG tablet  . fluticasone (CUTIVATE) 0.05 % cream  . hydrochlorothiazide (HYDRODIURIL) 25 MG tablet  . irbesartan (AVAPRO) 300 MG tablet  . metFORMIN (GLUCOPHAGE) 500 MG tablet  . Omega-3 Fatty Acids (FISH OIL) 1200 MG CAPS  . verapamil (VERELAN PM) 240 MG 24 hr capsule  . vitamin B-12 (CYANOCOBALAMIN) 1000 MCG tablet  . Vitamin D, Cholecalciferol, 25 MCG (1000 UT) CAPS  . Vitamin D, Ergocalciferol, (DRISDOL)  1.25 MG (50000 UNIT) CAPS capsule   No current facility-administered medications for this encounter.   . [START ON 04/19/2020] ceFAZolin (ANCEF) 3 g in dextrose 5 % 50 mL IVPB    Allison Zelenak, PA-C Surgical Short Stay/Anesthesiology [facility] Phone [phone] [facility] Phone [phone] 04/16/2020 1:41 PM       

## 2020-04-16 NOTE — Anesthesia Preprocedure Evaluation (Addendum)
Anesthesia Evaluation  Patient identified by MRN, date of birth, ID band Patient awake    Reviewed: Patient's Chart, lab work & pertinent test results  Airway Mallampati: III  TM Distance: >3 FB Neck ROM: Full    Dental  (+) Teeth Intact, Partial Lower   Pulmonary sleep apnea and Continuous Positive Airway Pressure Ventilation ,    Pulmonary exam normal        Cardiovascular hypertension, Pt. on medications  Rhythm:Regular Rate:Normal     Neuro/Psych Anxiety Depression    GI/Hepatic diverticulosis   Endo/Other  diabetes, Oral Hypoglycemic Agents  Renal/GU    S/p left lumpectomy 01/13, right lumpectomy 07/14 negative genitourinary   Musculoskeletal  (+) Arthritis , Osteoarthritis,  Spinal stenosis, spondylolisthesis    Abdominal Normal abdominal exam  (+) + obese,  Abdomen: soft. Bowel sounds: normal.  Peds  Hematology  (+) anemia ,   Anesthesia Other Findings   Reproductive/Obstetrics                           Anesthesia Physical Anesthesia Plan  ASA: II  Anesthesia Plan: General   Post-op Pain Management:    Induction:   PONV Risk Score and Plan: 3 and Ondansetron and Dexamethasone  Airway Management Planned: Oral ETT and Mask  Additional Equipment: None  Intra-op Plan:   Post-operative Plan: Extubation in OR  Informed Consent: I have reviewed the patients History and Physical, chart, labs and discussed the procedure including the risks, benefits and alternatives for the proposed anesthesia with the patient or authorized representative who has indicated his/her understanding and acceptance.     Dental advisory given  Plan Discussed with:   Anesthesia Plan Comments: (PAT note written 04/16/2020 by Myra Gianotti, PA-C. Per note, "History includes never smoker, HTN, HLD, borderline DM, OSA (CPAP), anemia, tinnitus, edema, chronic fatigue syndrome, left breast cancer  (s/p left breast lumpectomy 08/30/11; s/p right breast lumpectomy for benign stromal calcifications 02/06/13), pituitary adenoma (s/p transseptal transphenoidal resection 01/25/10), TKA (left 08/19/04, right 05/24/05), back surgery (L3-5 PLIF 12/11/14; removal of L5 left pedicle screw 04/20/15). BMI is consistent with morbid obesity" Lab Results      Component                Value               Date                      WBC                      6.5                 04/16/2020                HGB                      11.4 (L)            04/16/2020                HCT                      36.3                04/16/2020                MCV  80.0                04/16/2020                PLT                      275                 04/16/2020           Covid-19 Nucleic Acid Test Results Lab Results      Component                Value               Date                      SARSCOV2NAA              NEGATIVE            04/16/2020           )       Anesthesia Quick Evaluation

## 2020-04-19 ENCOUNTER — Other Ambulatory Visit: Payer: Self-pay

## 2020-04-19 ENCOUNTER — Encounter (HOSPITAL_COMMUNITY)
Admission: RE | Disposition: A | Discharge status: Home / Self Care | Payer: Self-pay | Source: Home / Self Care | Attending: Neurosurgery

## 2020-04-19 ENCOUNTER — Inpatient Hospital Stay (HOSPITAL_COMMUNITY)
Admission: RE | Admit: 2020-04-19 | Discharge: 2020-04-22 | DRG: 454 | Disposition: A | Discharge status: Home / Self Care | Payer: Medicare Other | Attending: Neurosurgery | Admitting: Neurosurgery

## 2020-04-19 ENCOUNTER — Inpatient Hospital Stay (HOSPITAL_COMMUNITY): Payer: Medicare Other

## 2020-04-19 ENCOUNTER — Inpatient Hospital Stay (HOSPITAL_COMMUNITY): Payer: Medicare Other | Admitting: Vascular Surgery

## 2020-04-19 ENCOUNTER — Inpatient Hospital Stay (HOSPITAL_COMMUNITY): Payer: Medicare Other | Admitting: Certified Registered"

## 2020-04-19 DIAGNOSIS — Z6841 Body Mass Index (BMI) 40.0 and over, adult: Secondary | ICD-10-CM | POA: Diagnosis not present

## 2020-04-19 DIAGNOSIS — M48062 Spinal stenosis, lumbar region with neurogenic claudication: Secondary | ICD-10-CM | POA: Diagnosis present

## 2020-04-19 DIAGNOSIS — D62 Acute posthemorrhagic anemia: Secondary | ICD-10-CM | POA: Diagnosis not present

## 2020-04-19 DIAGNOSIS — Z20822 Contact with and (suspected) exposure to covid-19: Secondary | ICD-10-CM | POA: Diagnosis present

## 2020-04-19 DIAGNOSIS — M5416 Radiculopathy, lumbar region: Secondary | ICD-10-CM | POA: Diagnosis present

## 2020-04-19 DIAGNOSIS — N289 Disorder of kidney and ureter, unspecified: Secondary | ICD-10-CM | POA: Diagnosis not present

## 2020-04-19 DIAGNOSIS — Z419 Encounter for procedure for purposes other than remedying health state, unspecified: Secondary | ICD-10-CM

## 2020-04-19 HISTORY — PX: POSTERIOR FUSION LUMBAR SPINE: SUR632

## 2020-04-19 LAB — GLUCOSE, CAPILLARY
Glucose-Capillary: 113 mg/dL — ABNORMAL HIGH (ref 70–99)
Glucose-Capillary: 166 mg/dL — ABNORMAL HIGH (ref 70–99)

## 2020-04-19 SURGERY — POSTERIOR LUMBAR FUSION 1 LEVEL
Anesthesia: General | Site: Spine Lumbar

## 2020-04-19 MED ORDER — METFORMIN HCL 500 MG PO TABS
500.0000 mg | ORAL_TABLET | Freq: Every day | ORAL | Status: DC
  Administered 2020-04-20 – 2020-04-21 (×2): 500 mg via ORAL
  Filled 2020-04-19 (×2): qty 1

## 2020-04-19 MED ORDER — ROCURONIUM BROMIDE 10 MG/ML (PF) SYRINGE
PREFILLED_SYRINGE | INTRAVENOUS | Status: DC | PRN
  Administered 2020-04-19: 60 mg via INTRAVENOUS
  Administered 2020-04-19: 30 mg via INTRAVENOUS
  Administered 2020-04-19: 40 mg via INTRAVENOUS

## 2020-04-19 MED ORDER — BUPIVACAINE-EPINEPHRINE (PF) 0.5% -1:200000 IJ SOLN
INTRAMUSCULAR | Status: DC | PRN
  Administered 2020-04-19: 10 mL

## 2020-04-19 MED ORDER — PROPOFOL 10 MG/ML IV BOLUS
INTRAVENOUS | Status: AC
  Filled 2020-04-19: qty 20

## 2020-04-19 MED ORDER — ACETAMINOPHEN 325 MG PO TABS
650.0000 mg | ORAL_TABLET | ORAL | Status: DC | PRN
  Administered 2020-04-20 – 2020-04-22 (×2): 650 mg via ORAL
  Filled 2020-04-19 (×2): qty 2

## 2020-04-19 MED ORDER — CHLORHEXIDINE GLUCONATE CLOTH 2 % EX PADS: 6.0000 | MEDICATED_PAD | Freq: Once | CUTANEOUS | Status: DC

## 2020-04-19 MED ORDER — THROMBIN 5000 UNITS EX SOLR
CUTANEOUS | Status: AC
  Filled 2020-04-19: qty 5000

## 2020-04-19 MED ORDER — HYDROCHLOROTHIAZIDE 25 MG PO TABS
25.0000 mg | ORAL_TABLET | Freq: Every day | ORAL | Status: DC
  Administered 2020-04-20 – 2020-04-21 (×2): 25 mg via ORAL
  Filled 2020-04-19 (×2): qty 1

## 2020-04-19 MED ORDER — DOCUSATE SODIUM 100 MG PO CAPS
100.0000 mg | ORAL_CAPSULE | Freq: Two times a day (BID) | ORAL | Status: DC
  Administered 2020-04-19 – 2020-04-21 (×5): 100 mg via ORAL
  Filled 2020-04-19 (×5): qty 1

## 2020-04-19 MED ORDER — ACETAMINOPHEN 650 MG RE SUPP: 650.0000 mg | RECTAL | Status: DC | PRN

## 2020-04-19 MED ORDER — ONDANSETRON HCL 4 MG/2ML IJ SOLN
INTRAMUSCULAR | Status: DC | PRN
  Administered 2020-04-19: 4 mg via INTRAVENOUS

## 2020-04-19 MED ORDER — BACITRACIN ZINC 500 UNIT/GM EX OINT
TOPICAL_OINTMENT | CUTANEOUS | Status: AC
  Filled 2020-04-19: qty 28.35

## 2020-04-19 MED ORDER — PROPOFOL 10 MG/ML IV BOLUS
INTRAVENOUS | Status: DC | PRN
  Administered 2020-04-19: 150 mg via INTRAVENOUS

## 2020-04-19 MED ORDER — BUPIVACAINE-EPINEPHRINE 0.5% -1:200000 IJ SOLN
INTRAMUSCULAR | Status: AC
  Filled 2020-04-19: qty 1

## 2020-04-19 MED ORDER — PHENOL 1.4 % MT LIQD: 1.0000 | OROMUCOSAL | Status: DC | PRN

## 2020-04-19 MED ORDER — MORPHINE SULFATE (PF) 4 MG/ML IV SOLN: 4.0000 mg | INTRAVENOUS | Status: DC | PRN

## 2020-04-19 MED ORDER — SODIUM CHLORIDE 0.9% FLUSH
3.0000 mL | Freq: Two times a day (BID) | INTRAVENOUS | Status: DC
  Administered 2020-04-19: 3 mL via INTRAVENOUS

## 2020-04-19 MED ORDER — ACETAMINOPHEN 500 MG PO TABS
1000.0000 mg | ORAL_TABLET | Freq: Four times a day (QID) | ORAL | Status: AC
  Administered 2020-04-19 – 2020-04-20 (×4): 1000 mg via ORAL
  Filled 2020-04-19 (×4): qty 2

## 2020-04-19 MED ORDER — INSULIN ASPART 100 UNIT/ML ~~LOC~~ SOLN: 0.0000 [IU] | Freq: Every day | SUBCUTANEOUS | Status: DC

## 2020-04-19 MED ORDER — BUPIVACAINE LIPOSOME 1.3 % IJ SUSP
INTRAMUSCULAR | Status: DC | PRN
  Administered 2020-04-19: 20 mL

## 2020-04-19 MED ORDER — OXYCODONE HCL 5 MG PO TABS
5.0000 mg | ORAL_TABLET | ORAL | Status: DC | PRN
  Administered 2020-04-19 – 2020-04-20 (×2): 5 mg via ORAL
  Filled 2020-04-19 (×2): qty 1

## 2020-04-19 MED ORDER — MIDAZOLAM HCL 2 MG/2ML IJ SOLN
INTRAMUSCULAR | Status: AC
  Filled 2020-04-19: qty 2

## 2020-04-19 MED ORDER — ACETAMINOPHEN 10 MG/ML IV SOLN
INTRAVENOUS | Status: AC
  Filled 2020-04-19: qty 100

## 2020-04-19 MED ORDER — OXYCODONE HCL 5 MG PO TABS
10.0000 mg | ORAL_TABLET | ORAL | Status: DC | PRN
  Administered 2020-04-20 – 2020-04-22 (×12): 10 mg via ORAL
  Filled 2020-04-19 (×12): qty 2

## 2020-04-19 MED ORDER — INSULIN ASPART 100 UNIT/ML ~~LOC~~ SOLN: 0.0000 [IU] | SUBCUTANEOUS | Status: DC

## 2020-04-19 MED ORDER — ONDANSETRON HCL 4 MG PO TABS: 4.0000 mg | ORAL_TABLET | Freq: Four times a day (QID) | ORAL | Status: DC | PRN

## 2020-04-19 MED ORDER — SODIUM CHLORIDE 0.9 % IV SOLN: 250.0000 mL | INTRAVENOUS | Status: DC

## 2020-04-19 MED ORDER — ORAL CARE MOUTH RINSE: 15.0000 mL | Freq: Once | OROMUCOSAL | Status: AC

## 2020-04-19 MED ORDER — ATORVASTATIN CALCIUM 10 MG PO TABS
20.0000 mg | ORAL_TABLET | Freq: Every day | ORAL | Status: DC
  Administered 2020-04-20 – 2020-04-21 (×2): 20 mg via ORAL
  Filled 2020-04-19 (×2): qty 2

## 2020-04-19 MED ORDER — ZOLPIDEM TARTRATE 5 MG PO TABS: 5.0000 mg | ORAL_TABLET | Freq: Every evening | ORAL | Status: DC | PRN

## 2020-04-19 MED ORDER — ACETAMINOPHEN 10 MG/ML IV SOLN
INTRAVENOUS | Status: DC | PRN
  Administered 2020-04-19: 1000 mg via INTRAVENOUS

## 2020-04-19 MED ORDER — BISACODYL 10 MG RE SUPP: 10.0000 mg | Freq: Every day | RECTAL | Status: DC | PRN

## 2020-04-19 MED ORDER — CEFAZOLIN SODIUM-DEXTROSE 2-4 GM/100ML-% IV SOLN
2.0000 g | Freq: Three times a day (TID) | INTRAVENOUS | Status: AC
  Administered 2020-04-19 – 2020-04-20 (×2): 2 g via INTRAVENOUS
  Filled 2020-04-19 (×2): qty 100

## 2020-04-19 MED ORDER — INSULIN ASPART 100 UNIT/ML ~~LOC~~ SOLN
0.0000 [IU] | Freq: Three times a day (TID) | SUBCUTANEOUS | Status: DC
  Administered 2020-04-20 – 2020-04-22 (×3): 2 [IU] via SUBCUTANEOUS

## 2020-04-19 MED ORDER — EZETIMIBE 10 MG PO TABS
10.0000 mg | ORAL_TABLET | Freq: Every day | ORAL | Status: DC
  Administered 2020-04-19 – 2020-04-21 (×3): 10 mg via ORAL
  Filled 2020-04-19 (×4): qty 1

## 2020-04-19 MED ORDER — DEXAMETHASONE SODIUM PHOSPHATE 10 MG/ML IJ SOLN
INTRAMUSCULAR | Status: DC | PRN
  Administered 2020-04-19: 10 mg via INTRAVENOUS

## 2020-04-19 MED ORDER — LIDOCAINE 2% (20 MG/ML) 5 ML SYRINGE
INTRAMUSCULAR | Status: DC | PRN
  Administered 2020-04-19: 60 mg via INTRAVENOUS

## 2020-04-19 MED ORDER — BUPIVACAINE LIPOSOME 1.3 % IJ SUSP
20.0000 mL | Freq: Once | INTRAMUSCULAR | Status: DC
  Filled 2020-04-19: qty 20

## 2020-04-19 MED ORDER — VERAPAMIL HCL ER 240 MG PO TBCR
240.0000 mg | EXTENDED_RELEASE_TABLET | Freq: Every day | ORAL | Status: DC
  Filled 2020-04-19 (×4): qty 1

## 2020-04-19 MED ORDER — LACTATED RINGERS IV SOLN: INTRAVENOUS | Status: DC

## 2020-04-19 MED ORDER — MIDAZOLAM HCL 2 MG/2ML IJ SOLN
INTRAMUSCULAR | Status: DC | PRN
  Administered 2020-04-19: 2 mg via INTRAVENOUS

## 2020-04-19 MED ORDER — ESCITALOPRAM OXALATE 20 MG PO TABS
20.0000 mg | ORAL_TABLET | Freq: Every day | ORAL | Status: DC
  Administered 2020-04-20 – 2020-04-21 (×2): 20 mg via ORAL
  Filled 2020-04-19 (×3): qty 1

## 2020-04-19 MED ORDER — FENTANYL CITRATE (PF) 250 MCG/5ML IJ SOLN
INTRAMUSCULAR | Status: DC | PRN
Start: 2020-04-19 — End: 2020-04-19
  Administered 2020-04-19 (×2): 50 ug via INTRAVENOUS
  Administered 2020-04-19: 100 ug via INTRAVENOUS
  Administered 2020-04-19 (×2): 50 ug via INTRAVENOUS
  Administered 2020-04-19: 150 ug via INTRAVENOUS
  Administered 2020-04-19: 50 ug via INTRAVENOUS

## 2020-04-19 MED ORDER — ALBUMIN HUMAN 5 % IV SOLN: INTRAVENOUS | Status: DC | PRN

## 2020-04-19 MED ORDER — FENTANYL CITRATE (PF) 250 MCG/5ML IJ SOLN
INTRAMUSCULAR | Status: AC
  Filled 2020-04-19: qty 5

## 2020-04-19 MED ORDER — ONDANSETRON HCL 4 MG/2ML IJ SOLN: 4.0000 mg | Freq: Four times a day (QID) | INTRAMUSCULAR | Status: DC | PRN

## 2020-04-19 MED ORDER — BACITRACIN ZINC 500 UNIT/GM EX OINT
TOPICAL_OINTMENT | CUTANEOUS | Status: DC | PRN
  Administered 2020-04-19: 1 via TOPICAL

## 2020-04-19 MED ORDER — 0.9 % SODIUM CHLORIDE (POUR BTL) OPTIME
TOPICAL | Status: DC | PRN
  Administered 2020-04-19: 1000 mL

## 2020-04-19 MED ORDER — SODIUM CHLORIDE 0.9 % IV SOLN: INTRAVENOUS | Status: DC | PRN

## 2020-04-19 MED ORDER — SODIUM CHLORIDE 0.9% FLUSH: 3.0000 mL | INTRAVENOUS | Status: DC | PRN

## 2020-04-19 MED ORDER — THROMBIN 5000 UNITS EX SOLR
OROMUCOSAL | Status: DC | PRN
  Administered 2020-04-19 (×2): 5 mL via TOPICAL

## 2020-04-19 MED ORDER — SUGAMMADEX SODIUM 200 MG/2ML IV SOLN
INTRAVENOUS | Status: DC | PRN
  Administered 2020-04-19: 250 mg via INTRAVENOUS

## 2020-04-19 MED ORDER — CYCLOBENZAPRINE HCL 10 MG PO TABS
10.0000 mg | ORAL_TABLET | Freq: Three times a day (TID) | ORAL | Status: DC | PRN
  Administered 2020-04-19 – 2020-04-21 (×3): 10 mg via ORAL
  Filled 2020-04-19 (×3): qty 1

## 2020-04-19 MED ORDER — MENTHOL 3 MG MT LOZG: 1.0000 | LOZENGE | OROMUCOSAL | Status: DC | PRN

## 2020-04-19 MED ORDER — AMITRIPTYLINE HCL 25 MG PO TABS
25.0000 mg | ORAL_TABLET | Freq: Every morning | ORAL | Status: DC
  Administered 2020-04-20 – 2020-04-21 (×2): 25 mg via ORAL
  Filled 2020-04-19 (×3): qty 1

## 2020-04-19 MED ORDER — IRBESARTAN 300 MG PO TABS
300.0000 mg | ORAL_TABLET | Freq: Every day | ORAL | Status: DC
  Administered 2020-04-19 – 2020-04-21 (×3): 300 mg via ORAL
  Filled 2020-04-19: qty 2
  Filled 2020-04-19: qty 1
  Filled 2020-04-19: qty 2
  Filled 2020-04-19 (×3): qty 1

## 2020-04-19 MED ORDER — CHLORHEXIDINE GLUCONATE 0.12 % MT SOLN
15.0000 mL | Freq: Once | OROMUCOSAL | Status: AC
  Administered 2020-04-19: 15 mL via OROMUCOSAL
  Filled 2020-04-19: qty 15

## 2020-04-19 SURGICAL SUPPLY — 78 items
ADH SKN CLS APL DERMABOND .7 (GAUZE/BANDAGES/DRESSINGS) ×1
APL SKNCLS STERI-STRIP NONHPOA (GAUZE/BANDAGES/DRESSINGS) ×1
BAG DECANTER FOR FLEXI CONT (MISCELLANEOUS) ×2 IMPLANT
BENZOIN TINCTURE PRP APPL 2/3 (GAUZE/BANDAGES/DRESSINGS) ×2 IMPLANT
BLADE CLIPPER SURG (BLADE) IMPLANT
BUR MATCHSTICK NEURO 3.0 LAGG (BURR) ×2 IMPLANT
BUR PRECISION FLUTE 6.0 (BURR) ×2 IMPLANT
CAGE ALTERA 10X31X9-13 15D (Cage) ×1 IMPLANT
CANISTER SUCT 3000ML PPV (MISCELLANEOUS) ×2 IMPLANT
CAP LOCK DLX THRD (Cap) ×7 IMPLANT
CARTRIDGE OIL MAESTRO DRILL (MISCELLANEOUS) ×1 IMPLANT
CLSR STERI-STRIP ANTIMIC 1/2X4 (GAUZE/BANDAGES/DRESSINGS) ×1 IMPLANT
CNTNR URN SCR LID CUP LEK RST (MISCELLANEOUS) ×1 IMPLANT
CONT SPEC 4OZ STRL OR WHT (MISCELLANEOUS) ×2
COVER BACK TABLE 60X90IN (DRAPES) ×2 IMPLANT
COVER WAND RF STERILE (DRAPES) ×1 IMPLANT
DECANTER SPIKE VIAL GLASS SM (MISCELLANEOUS) ×2 IMPLANT
DERMABOND ADVANCED (GAUZE/BANDAGES/DRESSINGS) ×1
DERMABOND ADVANCED .7 DNX12 (GAUZE/BANDAGES/DRESSINGS) IMPLANT
DIFFUSER DRILL AIR PNEUMATIC (MISCELLANEOUS) ×2 IMPLANT
DRAPE C-ARM 42X72 X-RAY (DRAPES) ×4 IMPLANT
DRAPE HALF SHEET 40X57 (DRAPES) ×2 IMPLANT
DRAPE LAPAROTOMY 100X72X124 (DRAPES) ×2 IMPLANT
DRAPE SURG 17X23 STRL (DRAPES) ×8 IMPLANT
DRSG OPSITE POSTOP 4X6 (GAUZE/BANDAGES/DRESSINGS) ×2 IMPLANT
DRSG OPSITE POSTOP 4X8 (GAUZE/BANDAGES/DRESSINGS) ×1 IMPLANT
ELECT BLADE 4.0 EZ CLEAN MEGAD (MISCELLANEOUS) ×2
ELECT REM PT RETURN 9FT ADLT (ELECTROSURGICAL) ×2
ELECTRODE BLDE 4.0 EZ CLN MEGD (MISCELLANEOUS) ×1 IMPLANT
ELECTRODE REM PT RTRN 9FT ADLT (ELECTROSURGICAL) ×1 IMPLANT
EVACUATOR 1/8 PVC DRAIN (DRAIN) IMPLANT
GAUZE 4X4 16PLY RFD (DISPOSABLE) ×1 IMPLANT
GLOVE BIO SURGEON STRL SZ 6.5 (GLOVE) ×4 IMPLANT
GLOVE BIO SURGEON STRL SZ8 (GLOVE) ×4 IMPLANT
GLOVE BIO SURGEON STRL SZ8.5 (GLOVE) ×4 IMPLANT
GLOVE BIOGEL PI IND STRL 6.5 (GLOVE) IMPLANT
GLOVE BIOGEL PI IND STRL 8 (GLOVE) IMPLANT
GLOVE BIOGEL PI INDICATOR 6.5 (GLOVE) ×2
GLOVE BIOGEL PI INDICATOR 8 (GLOVE) ×2
GLOVE ECLIPSE 7.5 STRL STRAW (GLOVE) ×3 IMPLANT
GLOVE EXAM NITRILE XL STR (GLOVE) IMPLANT
GOWN STRL REUS W/ TWL LRG LVL3 (GOWN DISPOSABLE) IMPLANT
GOWN STRL REUS W/ TWL XL LVL3 (GOWN DISPOSABLE) ×2 IMPLANT
GOWN STRL REUS W/TWL 2XL LVL3 (GOWN DISPOSABLE) ×1 IMPLANT
GOWN STRL REUS W/TWL LRG LVL3 (GOWN DISPOSABLE) ×4
GOWN STRL REUS W/TWL XL LVL3 (GOWN DISPOSABLE) ×4
HEMOSTAT POWDER KIT SURGIFOAM (HEMOSTASIS) ×3 IMPLANT
KIT BASIN OR (CUSTOM PROCEDURE TRAY) ×2 IMPLANT
KIT INFUSE X SMALL 1.4CC (Orthopedic Implant) ×1 IMPLANT
KIT TURNOVER KIT B (KITS) ×2 IMPLANT
MILL MEDIUM DISP (BLADE) ×2 IMPLANT
NDL HYPO 21X1.5 SAFETY (NEEDLE) IMPLANT
NEEDLE HYPO 21X1.5 SAFETY (NEEDLE) ×2 IMPLANT
NEEDLE HYPO 22GX1.5 SAFETY (NEEDLE) ×2 IMPLANT
NS IRRIG 1000ML POUR BTL (IV SOLUTION) ×2 IMPLANT
OIL CARTRIDGE MAESTRO DRILL (MISCELLANEOUS) ×2
PACK LAMINECTOMY NEURO (CUSTOM PROCEDURE TRAY) ×2 IMPLANT
PAD ARMBOARD 7.5X6 YLW CONV (MISCELLANEOUS) ×8 IMPLANT
PATTIES SURGICAL .5 X1 (DISPOSABLE) IMPLANT
PATTIES SURGICAL 1X1 (DISPOSABLE) ×1 IMPLANT
PUTTY DBM 10CC CALC GRAN (Putty) ×1 IMPLANT
ROD CURVED TI 6.35X70 (Rod) ×1 IMPLANT
ROD CURVED TI 6.35X90 (Rod) ×1 IMPLANT
SCREW PA DLX CREO 7.5X40 (Screw) ×2 IMPLANT
SCREW PA DLX CREO 7.5X45 (Screw) ×2 IMPLANT
SCREW PA DLX CREO 8.5X40 (Screw) ×3 IMPLANT
SPONGE LAP 4X18 RFD (DISPOSABLE) IMPLANT
SPONGE NEURO XRAY DETECT 1X3 (DISPOSABLE) IMPLANT
SPONGE SURGIFOAM ABS GEL 100 (HEMOSTASIS) IMPLANT
STRIP CLOSURE SKIN 1/2X4 (GAUZE/BANDAGES/DRESSINGS) ×2 IMPLANT
SUT VIC AB 1 CT1 18XBRD ANBCTR (SUTURE) ×2 IMPLANT
SUT VIC AB 1 CT1 8-18 (SUTURE) ×4
SUT VIC AB 2-0 CP2 18 (SUTURE) ×4 IMPLANT
SYR 20ML LL LF (SYRINGE) ×1 IMPLANT
TOWEL GREEN STERILE (TOWEL DISPOSABLE) ×2 IMPLANT
TOWEL GREEN STERILE FF (TOWEL DISPOSABLE) ×2 IMPLANT
TRAY FOLEY MTR SLVR 16FR STAT (SET/KITS/TRAYS/PACK) ×2 IMPLANT
WATER STERILE IRR 1000ML POUR (IV SOLUTION) ×2 IMPLANT

## 2020-04-19 NOTE — Op Note (Signed)
Brief history: The patient is a 74 year old black female on whom another physician performed a previous lumbar fusion.  She has had worsening back and bilateral leg pain.  She failed medical management.  She was worked up with a lumbar MRI which demonstrated spinal stenosis at L2-3.  I discussed the various treatment options with her.  She has decided proceed with surgery after weighing the risks, benefits and alternatives.  Preoperative diagnosis: L2-3  spinal stenosis compressing both the L2 and the L3 nerve roots; lumbago; lumbar radiculopathy; neurogenic claudication  Postoperative diagnosis: The same and lumbar pseudoarthrosis  Procedure: Bilateral L2-3 laminotomy/foraminotomies/medial facetectomy to decompress the bilateral L2 and L3 nerve roots(the work required to do this was in addition to the work required to do the posterior lumbar interbody fusion because of the patient's spinal stenosis, facet arthropathy. Etc. requiring a wide decompression of the nerve roots.);  L2-3 transforaminal lumbar interbody fusion with bone morphogenic protein soaked collagen sponges, local morselized autograft bone and Zimmer DBM; insertion of interbody prosthesis at L2-3 (globus peek expandable interbody prosthesis); posterior segmental instrumentation from L2 to L5 with globus titanium pedicle screws and rods; posterior lateral arthrodesis at L2-3 and redo L3-4, and L4-5 with bone morphogenic protein soaked collagen sponges, local morselized autograft bone and Zimmer DBM; exploration of lumbar fusion/removal of lumbar hardware  Surgeon: Dr. Earle Gell  Asst.: Arnetha Massy, NP  Anesthesia: Gen. endotracheal  Estimated blood loss: 300 cc  Drains: None  Complications: None  Description of procedure: The patient was brought to the operating room by the anesthesia team. General endotracheal anesthesia was induced. The patient was turned to the prone position on the Wilson frame. The patient's lumbosacral  region was then prepared with Betadine scrub and Betadine solution. Sterile drapes were applied.  I then injected the area to be incised with Marcaine with epinephrine solution. I then used the scalpel to make a linear midline incision over the L2-3, L3-4 and L4-5 interspace. I then used electrocautery to perform a bilateral subperiosteal dissection exposing the spinous process and lamina of 2 and L3 and to expose the old hardware at L3-4 and L4-5. We then inserted the Verstrac retractor to provide exposure.  I explored the fusion by removing the caps from the old screws and then removing the rods.  The screws appeared loose, particularly bilaterally at L3, indicative of a pseudoarthrosis.  I therefore decided to remove the old screws at L3-4 and L4-5 and redo the posterior lateral arthrodesis at L3-4 and L4-5 bilaterally.  I began the decompression by using the high speed drill to perform laminotomies at L2-3 bilaterally. We then used the Kerrison punches to widen the laminotomy and removed the ligamentum flavum at L2-3 bilaterally. We used the Kerrison punches to remove the medial facets at L2-3 bilaterally. We performed wide foraminotomies about the bilateral L2 and L3 nerve roots completing the decompression.  We now turned our attention to the posterior lumbar interbody fusion. I used a scalpel to incise the intervertebral disc at L2-3 bilaterally. I then performed a partial intervertebral discectomy at L2-3 bilaterally using the pituitary forceps. We prepared the vertebral endplates at L3-9 bilaterally for the fusion by removing the soft tissues with the curettes. We then used the trial spacers to pick the appropriate sized interbody prosthesis. We prefilled his prosthesis with a combination of local morselized autograft bone that we obtained during the decompression as well as Zimmer DBM. We inserted the prefilled prosthesis into the interspace at L2-3, we then turned and  expanded the prosthesis.  There was a good snug fit of the prosthesis in the interspace. We then filled and the remainder of the intervertebral disc space with bone morphogenic protein soaked collagen sponges, local morselized autograft bone and Zimmer DBM. This completed the posterior lumbar interbody arthrodesis.  During the decompression and insertion of the prosthesis the assistant protected the thecal sac and nerve roots with the D'Errico retractor.  We now turned attention to the instrumentation. Under fluoroscopic guidance we cannulated the bilateral L2 pedicles with the bone probe. We then removed the bone probe. We then tapped the pedicle with a 6.5 millimeter tap. We then removed the tap. We probed inside the tapped pedicle with a ball probe to rule out cortical breaches. We then inserted a 7.5 x 45 millimeter pedicle screw into the L2 pedicles bilaterally under fluoroscopic guidance.  I replaced the old screws with 7.5 x 40 and 8.5 x 40 screws, using the old screw holes, to get better bony purchase we then palpated along the medial aspect of the L2 pedicles to rule out cortical breaches. There were none. The nerve roots were not injured. We then connected the unilateral pedicle screws with a lordotic rod. We compressed the construct and secured the rod in place with the caps. We then tightened the caps appropriately. This completed the instrumentation from L2-L5 bilaterally.  We now turned our attention to the posterior lateral arthrodesis at L2-3, and the redo arthrodesis at L3-4 and L4-5.  I used electrocautery to clear the soft tissue from the bilateral facets at L3-4 and L4-5.  We used the high-speed drill to decorticate the remainder of the facets, pars, transverse process at L2-3, L3-4 and L4-5. We then applied a combination of bone morphogenic protein soaked collagen sponges, local morselized autograft bone and Zimmer DBM over these decorticated posterior lateral structures. This completed the posterior lateral  arthrodesis at L2-3, L3-4 and L4-5.  We then obtained hemostasis using bipolar electrocautery. We irrigated the wound out with bacitracin solution. We inspected the thecal sac and nerve roots and noted they were well decompressed. We then removed the retractor.  We injected Exparel . We reapproximated patient's thoracolumbar fascia with interrupted #1 Vicryl suture. We reapproximated patient's subcutaneous tissue with interrupted 2-0 Vicryl suture. The reapproximated patient's skin with Steri-Strips and benzoin. The wound was then coated with bacitracin ointment. A sterile dressing was applied. The drapes were removed. The patient was subsequently returned to the supine position where they were extubated by the anesthesia team.  She was then transported to the post anesthesia care unit in stable condition. All sponge instrument and needle counts were reportedly correct at the end of this case.

## 2020-04-19 NOTE — Progress Notes (Signed)
Subjective: The patient is somnolent but arousable.  She is in no apparent distress.  Objective: Vital signs in last 24 hours: Temp:  [97.8 F (36.6 C)] 97.8 F (36.6 C) (09/20 1022) Pulse Rate:  [84-92] (P) 92 (09/20 1728) Resp:  [18-20] (P) 20 (09/20 1728) BP: (179)/(63) (P) 184/80 (09/20 1728) SpO2:  [97 %-99 %] (P) 97 % (09/20 1728) Estimated body mass index is 45.18 kg/m as calculated from the following:   Height as of 04/16/20: 5\' 6"  (1.676 m).   Weight as of 04/16/20: 127 kg.   Intake/Output from previous day: No intake/output data recorded. Intake/Output this shift: Total I/O In: 2720 [I.V.:2060; Blood:60; IV Piggyback:600] Out: 300 [Urine:100; Blood:200]  Physical exam the patient is somnolent but arousable.  She is moving her lower extremities well.  Lab Results: No results for input(s): WBC, HGB, HCT, PLT in the last 72 hours. BMET No results for input(s): NA, K, CL, CO2, GLUCOSE, BUN, CREATININE, CALCIUM in the last 72 hours.  Studies/Results: DG Lumbar Spine 2-3 Views  Result Date: 04/19/2020 CLINICAL DATA:  Posterior fusion EXAM: LUMBAR SPINE - 2-3 VIEW COMPARISON:  Dec 02, 2019 FINDINGS: The patient has undergone L2-L3 posterior fusion with a new interbody spacer. Again noted is variant lumbar vertebral anatomy as previously described. Again noted is posterior fusion hardware from L3 through L5. IMPRESSION: Posterior fusion as detailed above. Electronically Signed   By: Constance Holster M.D.   On: 04/19/2020 16:53   DG C-Arm 1-60 Min  Result Date: 04/19/2020 CLINICAL DATA:  Posterior fusion EXAM: DG C-ARM 1-60 MIN FLUOROSCOPY TIME:  Fluoroscopy Time:  14 seconds Radiation Exposure Index (if provided by the fluoroscopic device): 13.55 mGy Number of Acquired Spot Images: 2 COMPARISON:  Dec 03, 2019 FINDINGS: The patient has undergone L2-L3 posterior fusion with a new interbody spacer. Again noted is variant lumbar vertebral anatomy as previously described. Again  noted is posterior fusion hardware from L3 through L5. IMPRESSION: Posterior fusion as detailed above. Electronically Signed   By: Constance Holster M.D.   On: 04/19/2020 16:56    Assessment/Plan: The patient is doing well.  I left a message for her husband.  LOS: 0 days     [name] 04/19/2020, 5:34 PM

## 2020-04-19 NOTE — Anesthesia Procedure Notes (Signed)

## 2020-04-19 NOTE — Transfer of Care (Signed)
Immediate Anesthesia Transfer of Care Note  Patient: Josephina Melcher  Procedure(s) Performed: POSTERIOR LUMBAR INTERBODY FUSION, INTERBODY PROSTHESIS, POSTERIOR INSTRUMENTATION LUMBAR TWO - LUMBAR THREE; EXPLORE FUSION (N/A Spine Lumbar)  Patient Location: PACU  Anesthesia Type:General  Level of Consciousness: awake, alert , oriented and patient cooperative  Airway & Oxygen Therapy: Patient Spontanous Breathing and Patient connected to face mask oxygen  Post-op Assessment: Report given to RN and Post -op Vital signs reviewed and stable  Post vital signs: Reviewed and stable  Last Vitals:  Vitals Value Taken Time  BP    Temp    Pulse 92 04/19/20 1730  Resp 17 04/19/20 1730  SpO2 97 % 04/19/20 1730  Vitals shown include unvalidated device data.  Last Pain:  Vitals:   04/19/20 1048  PainSc: 0-No pain      Patients Stated Pain Goal: 3 (71/59/53 9672)  Complications: No complications documented.

## 2020-04-19 NOTE — H&P (Signed)
Subjective: The patient is a 74 year old black female on whom I previously performed an L3-4 decompression, instrumentation and fusion.  She did well but has developed recurrent back and leg pain.  She has failed medical management.  She was worked up with lumbar x-rays and lumbar MRI which demonstrated spinal stenosis at L2-3.  I discussed the various treatment options.  She has decided to proceed with surgery.  Past Medical History:  Diagnosis Date  . Anemia   . Anxiety    takes Xanax daily  . Arthritis   . Back pain   . Blood transfusion   . Breast cancer ([place]) 2013   left breast  . Bronchitis    hx of  . Chronic fatigue syndrome   . Depression    takes Lexapro daily  . Diabetes mellitus without complication ([place])    boderline  . Diverticulosis   . Edema    feet and legs  . Headache(784.0)   . History of colon polyps 1998   adenomatous  . Hyperlipidemia    takes Niacin daily  . Hypertension    takes Verapamil and Avapro daily  . Joint pain   . OSA (obstructive sleep apnea)   . Personal history of radiation therapy   . Pneumonia 4/12   Albuterol daily as needed  . Radiation 10/29/11-11/24/11   left breast 6100 cGy  . Shortness of breath    with exertion  . Spinal stenosis   . Tinnitus     Past Surgical History:  Procedure Laterality Date  . APPENDECTOMY  1978  . BACK SURGERY    . BELPHAROPTOSIS REPAIR    . BREAST EXCISIONAL BIOPSY Right 02/06/2013  . BREAST LUMPECTOMY  08/2011   left  . BREAST LUMPECTOMY WITH NEEDLE LOCALIZATION Right 02/06/2013   Procedure: RIGHT BREAST LUMPECTOMY WITH NEEDLE LOCALIZATION;  Surgeon: Harl Bowie, MD;  Location: [facility];  Service: General;  Laterality: Right;  . COLONOSCOPY W/ POLYPECTOMY    . COLONOSCOPY WITH PROPOFOL N/A 10/27/2015   Procedure: COLONOSCOPY WITH PROPOFOL;  Surgeon: Mauri Pole, MD;  Location: [facility];  Service: Endoscopy;  Laterality: N/A;  . HARDWARE REMOVAL Left 04/20/2015   Procedure: HARDWARE  REMOVAL LEFT LUMBAR FIVE SCREW;  Surgeon: Karie Chimera, MD;  Location: [facility];  Service: Neurosurgery;  Laterality: Left;  . JOINT REPLACEMENT Bilateral    bilateral knee  . POLYPECTOMY  1990's  . THYROID CYST EXCISION  1994  . TOTAL KNEE ARTHROPLASTY  2006   bilateral  . TRANSPHENOIDAL / TRANSNASAL HYPOPHYSECTOMY / RESECTION PITUITARY TUMOR  6/11    Allergies  Allergen Reactions  . Pregabalin     Other reaction(s): Dizziness  . Cymbalta [Duloxetine Hcl] Other (See Comments)    Stomach pain  . Gabapentin Itching  . Lipitor [Atorvastatin] Other (See Comments)    Cramps  . Lisinopril Swelling  . Maxzide [Hydrochlorothiazide W-Triamterene] Other (See Comments)    Cramping   . Niacin Itching  . Pravastatin Sodium Other (See Comments)    Headache   . Simvastatin Other (See Comments)    Memory loss  . Lodine [Etodolac] Itching    Social History   Tobacco Use  . Smoking status: Never Smoker  . Smokeless tobacco: Never Used  Substance Use Topics  . Alcohol use: No    Family History  Problem Relation Age of Onset  . Heart disease Father   . Clotting disorder Father   . Stroke Father   . Hypertension Father   .  Heart Problems Father   . Stroke Mother   . Diabetes Mother   . Hypertension Mother   . Hyperlipidemia Mother   . Obesity Mother   . Cancer Sister 47       Breast Cancer  . Breast cancer Sister 48  . Colon cancer Neg Hx   . Neuropathy Neg Hx    Prior to Admission medications   Medication Sig Start Date End Date Taking? Authorizing Provider  acetaminophen (TYLENOL) 500 MG tablet Take 1,000 mg by mouth 2 (two) times daily as needed for headache.   Yes [provider]  amitriptyline (ELAVIL) 25 MG tablet Take 25 mg by mouth every morning.    Yes [provider]  aspirin 81 MG tablet Take 81 mg by mouth daily.   Yes [provider]  atorvastatin (LIPITOR) 20 MG tablet Take 20 mg by mouth daily.    Yes [provider]   escitalopram (LEXAPRO) 20 MG tablet Take 20 mg by mouth daily.    Yes [provider]  ezetimibe (ZETIA) 10 MG tablet Take 10 mg by mouth at bedtime.  01/13/19  Yes [provider]  fluticasone (CUTIVATE) 0.05 % cream Apply 1 application topically daily as needed (itching).  11/25/18  Yes [provider]  hydrochlorothiazide (HYDRODIURIL) 25 MG tablet Take 1 tablet (25 mg total) by mouth daily. 11/24/19  Yes [name], PA-C  irbesartan (AVAPRO) 300 MG tablet Take 1 tablet (300 mg total) by mouth at bedtime. 08/23/17  Yes Alene Mires, Sahar M, PA-C  metFORMIN (GLUCOPHAGE) 500 MG tablet Take 1 tablet (500 mg total) by mouth daily with breakfast. 10/30/19  Yes [name], PA-C  Omega-3 Fatty Acids (FISH OIL) 1200 MG CAPS Take 1,200 mg by mouth 2 (two) times daily.    Yes [provider]  verapamil (VERELAN PM) 240 MG 24 hr capsule Take 240 mg by mouth daily.    Yes [provider]  vitamin B-12 (CYANOCOBALAMIN) 1000 MCG tablet Take 1,000 mcg by mouth at bedtime.   Yes [provider]  Vitamin D, Cholecalciferol, 25 MCG (1000 UT) CAPS Take 1,000 Units by mouth at bedtime.    Yes [provider]  blood glucose meter kit and supplies KIT Dispense based on patient and insurance preference. Use up to four times daily as directed. (FOR ICD-9 250.00, 250.01). 06/07/17   Waldon Merl, PA-C  Vitamin D, Ergocalciferol, (DRISDOL) 1.25 MG (50000 UNIT) CAPS capsule Take 1 capsule (50,000 Units total) by mouth every 7 (seven) days. Patient not taking: Reported on 04/08/2020 01/06/20   [name], PA-C     Review of Systems  Positive ROS: As above  All other systems have been reviewed and were otherwise negative with the exception of those mentioned in the HPI and as above.  Objective: Vital signs in last 24 hours: Temp:  [97.8 F (36.6 C)] 97.8 F (36.6 C) (09/20 1022) Pulse Rate:  [84] 84 (09/20 1022) Resp:  [18] 18 (09/20 1022) BP:  (179)/(63) 179/63 (09/20 1022) SpO2:  [99 %] 99 % (09/20 1022) Estimated body mass index is 45.18 kg/m as calculated from the following:   Height as of 04/16/20: 5' 6" (1.676 m).   Weight as of 04/16/20: 127 kg.   General Appearance: Alert Head: Normocephalic, without obvious abnormality, atraumatic Eyes: PERRL, conjunctiva/corneas clear, EOM's intact,    Ears: Normal  Throat: Normal  Neck: Supple, Back: The patient's lumbar incision is well-healed. Lungs: Clear to auscultation bilaterally, respirations unlabored  Heart: Regular rate and rhythm, no murmur, rub or gallop Abdomen: Soft, non-tender Extremities: Extremities normal, atraumatic, no cyanosis or edema Skin: unremarkable  NEUROLOGIC:   Mental status: alert and oriented,Motor Exam - grossly normal Sensory Exam - grossly normal Reflexes:  Coordination - grossly normal Gait - grossly normal Balance - grossly normal Cranial Nerves: I: smell Not tested  II: visual acuity  OS: Normal  OD: Normal   II: visual fields Full to confrontation  II: pupils Equal, round, reactive to light  III,VII: ptosis None  III,IV,VI: extraocular muscles  Full ROM  V: mastication Normal  V: facial light touch sensation  Normal  V,VII: corneal reflex  Present  VII: facial muscle function - upper  Normal  VII: facial muscle function - lower Normal  VIII: hearing Not tested  IX: soft palate elevation  Normal  IX,X: gag reflex Present  XI: trapezius strength  5/5  XI: sternocleidomastoid strength 5/5  XI: neck flexion strength  5/5  XII: tongue strength  Normal    Data Review Lab Results  Component Value Date   WBC 6.5 04/16/2020   HGB 11.4 (L) 04/16/2020   HCT 36.3 04/16/2020   MCV 80.0 04/16/2020   PLT 275 04/16/2020   Lab Results  Component Value Date   NA 140 04/16/2020   K 3.6 04/16/2020   CL 106 04/16/2020   CO2 23 04/16/2020   BUN 17 04/16/2020   CREATININE 1.50 (H) 04/16/2020   GLUCOSE 105 (H) 04/16/2020   No results  found for: INR, PROTIME  Assessment/Plan: Lumbar spinal stenosis, lumbago, lumbar radiculopathy, neurogenic claudication: I have discussed the situation with the patient.  I reviewed her imaging studies with her and pointed out the abnormalities.  We have discussed the various treatment options including surgery.  I have described the surgical treatment option of an L2-3 decompression, instrumentation and fusion.  I have shown her surgical models.  I have given her a surgical pamphlet.  We have discussed the risks, benefits, alternatives, expected postoperative course, and likelihood of achieving our goals with surgery.  I have answered all her questions.  She has decided to proceed with surgery.   [name] 04/19/2020 11:44 AM

## 2020-04-20 LAB — BASIC METABOLIC PANEL
Anion gap: 10 (ref 5–15)
BUN: 15 mg/dL (ref 8–23)
CO2: 23 mmol/L (ref 22–32)
Calcium: 9.5 mg/dL (ref 8.9–10.3)
Chloride: 104 mmol/L (ref 98–111)
Creatinine, Ser: 1.48 mg/dL — ABNORMAL HIGH (ref 0.44–1.00)
GFR calc Af Amer: 39 mL/min — ABNORMAL LOW (ref >60)
GFR calc non Af Amer: 34 mL/min — ABNORMAL LOW (ref >60)
Glucose, Bld: 122 mg/dL — ABNORMAL HIGH (ref 70–99)
Potassium: 4.3 mmol/L (ref 3.5–5.1)
Sodium: 137 mmol/L (ref 135–145)

## 2020-04-20 LAB — GLUCOSE, CAPILLARY
Glucose-Capillary: 120 mg/dL — ABNORMAL HIGH (ref 70–99)
Glucose-Capillary: 122 mg/dL — ABNORMAL HIGH (ref 70–99)
Glucose-Capillary: 111 mg/dL — ABNORMAL HIGH (ref 70–99)
Glucose-Capillary: 117 mg/dL — ABNORMAL HIGH (ref 70–99)

## 2020-04-20 LAB — CBC
HCT: 29.6 % — ABNORMAL LOW (ref 36.0–46.0)
Hemoglobin: 9.3 g/dL — ABNORMAL LOW (ref 12.0–15.0)
MCH: 25.3 pg — ABNORMAL LOW (ref 26.0–34.0)
MCHC: 31.4 g/dL (ref 30.0–36.0)
MCV: 80.4 fL (ref 80.0–100.0)
Platelets: 212 10*3/uL (ref 150–400)
RBC: 3.68 MIL/uL — ABNORMAL LOW (ref 3.87–5.11)
RDW: 16 % — ABNORMAL HIGH (ref 11.5–15.5)
WBC: 10.9 10*3/uL — ABNORMAL HIGH (ref 4.0–10.5)
nRBC: 0 % (ref 0.0–0.2)

## 2020-04-20 LAB — HEMOGLOBIN A1C
Hgb A1c MFr Bld: 6.3 % — ABNORMAL HIGH (ref 4.8–5.6)
Mean Plasma Glucose: 134.11 mg/dL

## 2020-04-20 MED ORDER — FERROUS SULFATE 325 (65 FE) MG PO TABS
325.0000 mg | ORAL_TABLET | Freq: Every day | ORAL | Status: DC
  Administered 2020-04-20 – 2020-04-21 (×2): 325 mg via ORAL
  Filled 2020-04-20 (×2): qty 1

## 2020-04-20 MED FILL — Thrombin For Soln 5000 Unit: CUTANEOUS | Qty: 5000 | Status: AC

## 2020-04-20 NOTE — Evaluation (Addendum)
Occupational Therapy Evaluation Patient Details Name: [name] MRN: [id number] DOB: November 16, 1945 Today's Date: 04/20/2020    History of Present Illness Pt is a 74 y/o female now s/p PLIF, instrumentation and fusion L2-3. PMHx includes previous spinal sx, anxiety, DM, breast CA, depression, hx TKA, HTN   Clinical Impression   This 74 y/o female presents with the above. PTA pt reports being mod independent with ADL and functional mobility. Pt currently performing room level mobility using RW with minguard assist, requiring minA for brace management, up to [place] for LB ADL and bed mobility secondary to having to adhere to precautions. Educated pt re: back precautions, brace management, AE, safety and compensatory techniques for completing ADL and mobility tasks while maintaining precautions with pt verbalizing understanding throughout. Pt reports plans to return home with spouse/family assist for ADL/iADL PRN. She will benefit from continued acute OT services to further review use of AE for ADL completion and to maximize her overall safety and independence with ADL and mobility tasks. Will follow.     Follow Up Recommendations  No OT follow up;Supervision/Assistance - 24 hour    Equipment Recommendations  None recommended by OT           Precautions / Restrictions Precautions Precautions: Fall;Back Precaution Booklet Issued: Yes (comment) Precaution Comments: reviewed throughout session Required Braces or Orthoses: Spinal Brace Spinal Brace: Lumbar corset;Applied in sitting position Restrictions Weight Bearing Restrictions: No      Mobility Bed Mobility Overal bed mobility: Needs Assistance Bed Mobility: Sit to Sidelying         Sit to sidelying: Mod assist General bed mobility comments: requires assist for LEs onto EOB  Transfers Overall transfer level: Needs assistance Equipment used: Rolling walker (2 wheeled);None Transfers: Sit to/from Stand Sit to Stand: Min  guard         General transfer comment: for safety, VCs for safe hand placement    Balance Overall balance assessment: Needs assistance Sitting-balance support: Feet supported Sitting balance-Leahy Scale: Fair     Standing balance support: Bilateral upper extremity supported;No upper extremity supported;During functional activity Standing balance-Leahy Scale: Fair Standing balance comment: able to static stand at sink for grooming ADL with supervision                           ADL either performed or assessed with clinical judgement   ADL Overall ADL's : Needs assistance/impaired Eating/Feeding: Modified independent;Sitting   Grooming: Wash/dry hands;Supervision/safety;Standing Grooming Details (indicate cue type and reason): standing at sink in bathroom Upper Body Bathing: Supervision/ safety;Sitting   Lower Body Bathing: Moderate assistance;Sit to/from stand Lower Body Bathing Details (indicate cue type and reason): without use of AE Upper Body Dressing : Minimal assistance;Sitting Upper Body Dressing Details (indicate cue type and reason): for brace management Lower Body Dressing: Moderate assistance;Sit to/from stand Lower Body Dressing Details (indicate cue type and reason): without use of AE, minguard assist for standing balance  Toilet Transfer: Min guard;Ambulation;RW;Grab bars   Toileting- Clothing Manipulation and Hygiene: Min guard;Sitting/lateral lean;Sit to/from stand Toileting - Water quality scientist Details (indicate cue type and reason): for posterior pericare, anticipate may need increased assist for posterior pericare, verbally reviewed potential need for AE, will follow up for demonstration     Functional mobility during ADLs: Min guard;Rolling walker                    Pertinent Vitals/Pain Pain Assessment: Faces Faces Pain Scale: Hurts little more  Pain Location: incisional Pain Descriptors / Indicators:  Discomfort;Guarding;Grimacing;Sore Pain Intervention(s): Limited activity within patient's tolerance;Monitored during session;Repositioned     Hand Dominance     Extremity/Trunk Assessment Upper Extremity Assessment Upper Extremity Assessment: Overall WFL for tasks assessed   Lower Extremity Assessment Lower Extremity Assessment: Defer to PT evaluation       Communication Communication Communication: No difficulties   Cognition Arousal/Alertness: Awake/alert;Lethargic;Suspect due to medications (increased lethargy as session progressed ) Behavior During Therapy: WFL for tasks assessed/performed Overall Cognitive Status: Within Functional Limits for tasks assessed                                 General Comments: for basic tasks assessed today   General Comments       Exercises     Shoulder Instructions      Home Living Family/patient expects to be discharged to:: Private residence Living Arrangements: Spouse/significant other Available Help at Discharge: Family;Available 24 hours/day Type of Home: House             Bathroom Shower/Tub: Walk-in Psychologist, prison and probation services:  (comfort height)     Home Equipment: Clinical cytogeneticist - 4 wheels;Adaptive equipment Adaptive Equipment: Sock aid        Prior Functioning/Environment Level of Independence: Independent with assistive device(s)        Comments: using rollator intermittently        OT Problem List: Decreased strength;Decreased range of motion;Decreased activity tolerance;Impaired balance (sitting and/or standing);Decreased knowledge of use of DME or AE;Decreased knowledge of precautions;Obesity;Pain      OT Treatment/Interventions: Self-care/ADL training;Therapeutic exercise;Energy conservation;DME and/or AE instruction;Therapeutic activities;Patient/family education;Balance training    OT Goals(Current goals can be found in the care plan section) Acute Rehab OT Goals Patient Stated  Goal: home when able OT Goal Formulation: With patient Time For Goal Achievement: 05/04/20 Potential to Achieve Goals: Good  OT Frequency: Min 2X/week   Barriers to D/C:            Co-evaluation              AM-PAC OT "6 Clicks" Daily Activity     Outcome Measure Help from another person eating meals?: None Help from another person taking care of personal grooming?: A Little Help from another person toileting, which includes using toliet, bedpan, or urinal?: A Lot Help from another person bathing (including washing, rinsing, drying)?: A Little Help from another person to put on and taking off regular upper body clothing?: A Little Help from another person to put on and taking off regular lower body clothing?: A Lot 6 Click Score: 17   End of Session Equipment Utilized During Treatment: Back brace;Rolling walker Nurse Communication: Mobility status  Activity Tolerance: Patient tolerated treatment well Patient left: in bed;with call bell/phone within reach  OT Visit Diagnosis: Other abnormalities of gait and mobility (R26.89);Pain Pain - part of body:  (back)                Time: 9629-5284 OT Time Calculation (min): 15 min Charges:  OT General Charges $OT Visit: 1 Visit OT Evaluation $OT Eval Low Complexity: [place], OT Acute Rehabilitation Services Pager [phone] Office [phone]   Raymondo Band 04/20/2020, 1:36 PM

## 2020-04-20 NOTE — Progress Notes (Signed)
Subjective: The patient is alert and pleasant.  She looks well.  She wants to stay until tomorrow.  Objective: Vital signs in last 24 hours: Temp:  [97.7 F (36.5 C)-98.6 F (37 C)] 98.6 F (37 C) (09/21 0419) Pulse Rate:  [84-105] 105 (09/21 0419) Resp:  [15-28] 18 (09/21 0419) BP: (126-184)/(63-89) 126/74 (09/21 0419) SpO2:  [96 %-100 %] 96 % (09/21 0419) Estimated body mass index is 45.18 kg/m as calculated from the following:   Height as of 04/16/20: 5\' 6"  (1.676 m).   Weight as of 04/16/20: 127 kg.   Intake/Output from previous day: 09/20 0701 - 09/21 0700 In: 2720 [I.V.:2060; Blood:60; IV Piggyback:600] Out: 300 [Urine:100; Blood:200] Intake/Output this shift: No intake/output data recorded.  Physical exam the patient is alert and oriented.  She looks well.  Her dressing is clean and dry.  She is moving her lower extremities well.  Lab Results: Recent Labs    04/20/20 0633  WBC 10.9*  HGB 9.3*  HCT 29.6*  PLT 212   BMET Recent Labs    04/20/20 0633  NA 137  K 4.3  CL 104  CO2 23  GLUCOSE 122*  BUN 15  CREATININE 1.48*  CALCIUM 9.5    Studies/Results: DG Lumbar Spine 2-3 Views  Result Date: 04/19/2020 CLINICAL DATA:  Posterior fusion EXAM: LUMBAR SPINE - 2-3 VIEW COMPARISON:  Dec 02, 2019 FINDINGS: The patient has undergone L2-L3 posterior fusion with a new interbody spacer. Again noted is variant lumbar vertebral anatomy as previously described. Again noted is posterior fusion hardware from L3 through L5. IMPRESSION: Posterior fusion as detailed above. Electronically Signed   By: Constance Holster M.D.   On: 04/19/2020 16:53   DG C-Arm 1-60 Min  Result Date: 04/19/2020 CLINICAL DATA:  Posterior fusion EXAM: DG C-ARM 1-60 MIN FLUOROSCOPY TIME:  Fluoroscopy Time:  14 seconds Radiation Exposure Index (if provided by the fluoroscopic device): 13.55 mGy Number of Acquired Spot Images: 2 COMPARISON:  Dec 03, 2019 FINDINGS: The patient has undergone L2-L3  posterior fusion with a new interbody spacer. Again noted is variant lumbar vertebral anatomy as previously described. Again noted is posterior fusion hardware from L3 through L5. IMPRESSION: Posterior fusion as detailed above. Electronically Signed   By: Constance Holster M.D.   On: 04/19/2020 16:56    Assessment/Plan: Postop day #1: The patient is doing well.  We will mobilize her with PT and OT.  We will plan to send her home tomorrow.  Acute blood loss anemia: Asymptomatic.  I will start iron.  Renal insufficiency: The patient's creatinine is stable.  LOS: 1 day     [name] 04/20/2020, 7:32 AM     Patient ID: [name], female   DOB: August 31, 1945, 74 y.o.   MRN: [id number]

## 2020-04-20 NOTE — Anesthesia Postprocedure Evaluation (Signed)
Anesthesia Post Note  Patient: [name]  Procedure(s) Performed: POSTERIOR LUMBAR INTERBODY FUSION, INTERBODY PROSTHESIS, POSTERIOR INSTRUMENTATION LUMBAR TWO - LUMBAR THREE; EXPLORE FUSION (N/A Spine Lumbar)     Patient location during evaluation: PACU Anesthesia Type: General Level of consciousness: awake and alert Pain management: pain level controlled Vital Signs Assessment: post-procedure vital signs reviewed and stable Respiratory status: spontaneous breathing, nonlabored ventilation, respiratory function stable and patient connected to nasal cannula oxygen Cardiovascular status: blood pressure returned to baseline and stable Postop Assessment: no apparent nausea or vomiting Anesthetic complications: no   No complications documented.  Last Vitals:  Vitals:   04/20/20 0419 04/20/20 0754  BP: 126/74 (!) 98/52  Pulse: (!) 105 91  Resp: 18 20  Temp: 37 C 37.3 C  SpO2: 96% 93%    Last Pain:  Vitals:   04/20/20 0754  TempSrc: Oral  PainSc:                  [place] S

## 2020-04-20 NOTE — Evaluation (Signed)
Physical Therapy Evaluation Patient Details Name: [name] MRN: [id number] DOB: 11/09/1945 Today's Date: 04/20/2020   History of Present Illness  Pt is a 74 y/o female now s/p PLIF, instrumentation and fusion L2-3. PMHx includes previous spinal sx, anxiety, DM, breast CA, depression, hx TKA, HTN  Clinical Impression  Pt admitted with above diagnosis. At the time of PT eval, pt was able to demonstrate transfers and ambulation with gross min guard assist and rolling walker for support. Pt was educated on precautions, brace application/wearing schedule, appropriate activity progression, and car transfer. Pt currently with functional limitations due to the deficits listed below (see PT Problem List). Pt will benefit from skilled PT to increase their independence and safety with mobility to allow discharge to the venue listed below.      Follow Up Recommendations No PT follow up;Supervision for mobility/OOB    Equipment Recommendations  Rolling walker with 5" wheels (bariatric)    Recommendations for Other Services       Precautions / Restrictions Precautions Precautions: Fall;Back Precaution Booklet Issued: Yes (comment) Precaution Comments: reviewed throughout session Required Braces or Orthoses: Spinal Brace Spinal Brace: Lumbar corset;Applied in sitting position Restrictions Weight Bearing Restrictions: No      Mobility  Bed Mobility Overal bed mobility: Needs Assistance Bed Mobility: Rolling;Sidelying to Sit Rolling: Supervision Sidelying to sit: Min guard     Sit to sidelying: Mod assist General bed mobility comments: VC's for proper log roll technique.   Transfers Overall transfer level: Needs assistance Equipment used: Rolling walker (2 wheeled);None Transfers: Sit to/from Stand Sit to Stand: Min guard         General transfer comment: for safety, VCs for safe hand placement  Ambulation/Gait Ambulation/Gait assistance: Min guard Gait Distance (Feet):  200 Feet Assistive device: Rolling walker (2 wheeled) Gait Pattern/deviations: Step-through pattern;Decreased stride length;Trunk flexed Gait velocity: Decreased Gait velocity interpretation: <1.31 ft/sec, indicative of household ambulator General Gait Details: Slow but generally steady with the RW. VC's for improved posture, closer walker proximity, and forward gaze.   Stairs            Wheelchair Mobility    Modified Rankin (Stroke Patients Only)       Balance Overall balance assessment: Needs assistance Sitting-balance support: Feet supported Sitting balance-Leahy Scale: Fair     Standing balance support: Bilateral upper extremity supported;No upper extremity supported;During functional activity Standing balance-Leahy Scale: Fair Standing balance comment: able to static stand at sink for grooming ADL with supervision                             Pertinent Vitals/Pain Pain Assessment: Faces Faces Pain Scale: Hurts little more Pain Location: incisional Pain Descriptors / Indicators: Discomfort;Guarding;Grimacing;Sore Pain Intervention(s): Limited activity within patient's tolerance;Monitored during session;Repositioned    Home Living Family/patient expects to be discharged to:: Private residence Living Arrangements: Spouse/significant other Available Help at Discharge: Family;Available 24 hours/day Type of Home: House Home Access: Stairs to enter   [employer] of Steps: 3 Home Layout: Two level Home Equipment: Clinical cytogeneticist - 4 wheels;Adaptive equipment      Prior Function Level of Independence: Independent with assistive device(s)         Comments: using rollator intermittently     Hand Dominance   Dominant Hand: Right    Extremity/Trunk Assessment   Upper Extremity Assessment Upper Extremity Assessment: Defer to OT evaluation    Lower Extremity Assessment Lower Extremity Assessment: Generalized weakness (Consistent  with pre-op diagnosis)    Cervical / Trunk Assessment Cervical / Trunk Assessment: Other exceptions Cervical / Trunk Exceptions: s/p surgery  Communication   Communication: No difficulties  Cognition Arousal/Alertness: Awake/alert Behavior During Therapy: WFL for tasks assessed/performed Overall Cognitive Status: Within Functional Limits for tasks assessed                                 General Comments: for basic tasks assessed today      General Comments      Exercises     Assessment/Plan    PT Assessment Patient needs continued PT services  PT Problem List Decreased strength;Decreased activity tolerance;Decreased balance;Decreased mobility;Decreased knowledge of use of DME;Decreased safety awareness;Decreased knowledge of precautions;Pain       PT Treatment Interventions DME instruction;Gait training;Stair training;Functional mobility training;Therapeutic activities;Therapeutic exercise;Neuromuscular re-education;Patient/family education    PT Goals (Current goals can be found in the Care Plan section)  Acute Rehab PT Goals Patient Stated Goal: home when able PT Goal Formulation: With patient Time For Goal Achievement: 04/27/20 Potential to Achieve Goals: Good    Frequency Min 5X/week   Barriers to discharge        Co-evaluation               AM-PAC PT "6 Clicks" Mobility  Outcome Measure Help needed turning from your back to your side while in a flat bed without using bedrails?: None Help needed moving from lying on your back to sitting on the side of a flat bed without using bedrails?: A Little Help needed moving to and from a bed to a chair (including a wheelchair)?: A Little Help needed standing up from a chair using your arms (e.g., wheelchair or bedside chair)?: A Little Help needed to walk in hospital room?: A Little Help needed climbing 3-5 steps with a railing? : A Little 6 Click Score: 19    End of Session Equipment Utilized  During Treatment: Gait belt;Back brace Activity Tolerance: Patient tolerated treatment well Patient left: Other (comment) (In bathroom with OT present) Nurse Communication: Mobility status PT Visit Diagnosis: Unsteadiness on feet (R26.81);Pain Pain - part of body:  (back)    Time: 3435-6861 PT Time Calculation (min) (ACUTE ONLY): 18 min   Charges:   PT Evaluation $PT Eval Low Complexity: 1 Low          Rolinda Roan, PT, DPT Acute Rehabilitation Services Pager: [phone] Office: [phone]   Thelma Comp 04/20/2020, 3:14 PM

## 2020-04-20 NOTE — Progress Notes (Signed)
Orthopedic Tech Progress Note Patient Details:  [name] 01-15-1946 [id number]  Patient ID: [name], female   DOB: 1946/04/16, 74 y.o.   MRN: [id number] Pt has brace according to floor staff  [name] 04/20/2020, 6:01 AM

## 2020-04-21 LAB — GLUCOSE, CAPILLARY
Glucose-Capillary: 109 mg/dL — ABNORMAL HIGH (ref 70–99)
Glucose-Capillary: 105 mg/dL — ABNORMAL HIGH (ref 70–99)
Glucose-Capillary: 124 mg/dL — ABNORMAL HIGH (ref 70–99)
Glucose-Capillary: 121 mg/dL — ABNORMAL HIGH (ref 70–99)

## 2020-04-21 NOTE — Progress Notes (Signed)
Occupational Therapy Treatment Patient Details Name: [name] MRN: [id number] DOB: 1946/07/27 Today's Date: 04/21/2020    History of present illness Pt is a 74 y/o female now s/p PLIF, instrumentation and fusion L2-3. PMHx includes previous spinal sx, anxiety, DM, breast CA, depression, hx TKA, HTN   OT comments  Pt with gradual progress towards OT goals. Focus of session on AE education to increase her overall safety and independence with ADL tasks. Pt return demonstrating/verbalizing understanding of education, requiring minA for carryover of AE during session completion. Pt continues to require up to modA for bed mobility given difficulty bringing LEs onto EOB. Granddaughter present start of session for initial education as well. Will continue to follow while pt remains in acute setting to progress pt towards established OT goals.     Follow Up Recommendations  No OT follow up;Supervision/Assistance - 24 hour    Equipment Recommendations  None recommended by OT          Precautions / Restrictions Precautions Precautions: Fall;Back Precaution Booklet Issued: Yes (comment) Precaution Comments: reviewed throughout session Required Braces or Orthoses: Spinal Brace Spinal Brace: Lumbar corset;Applied in sitting position Restrictions Weight Bearing Restrictions: No       Mobility Bed Mobility Overal bed mobility: Needs Assistance Bed Mobility: Rolling;Sit to Sidelying Rolling: Min guard Sidelying to sit: Mod assist       General bed mobility comments: multimodal cueing for use of log roll technique; assist for LEs onto EOB                Balance Overall balance assessment: Needs assistance Sitting-balance support: Feet supported Sitting balance-Leahy Scale: Fair     Standing balance support: Bilateral upper extremity supported;No upper extremity supported;During functional activity Standing balance-Leahy Scale: Fair Standing balance comment: able to  static stand at sink for grooming ADL with supervision                           ADL either performed or assessed with clinical judgement   ADL Overall ADL's : Needs assistance/impaired               Lower Body Bathing Details (indicate cue type and reason): educated in use of AE for task, reinforced using shower seat for increased safety     Lower Body Dressing: Moderate assistance;Sit to/from stand;Adhering to back precautions;With adaptive equipment Lower Body Dressing Details (indicate cue type and reason): educated in use of sock aide and reacher for completing LB dressing task; pt return demonstrating with minA and verbal cues       Toileting - Clothing Manipulation Details (indicate cue type and reason): educated in use of toilet aide to assist in pericare, pt verbalizing understanding but question how much education she is retaining as pt with increased lethargy as session progressed      Functional mobility during ADLs: Min guard;Rolling walker                         Cognition Arousal/Alertness: Awake/alert;Lethargic;Suspect due to medications Behavior During Therapy: [facility] for tasks assessed/performed Overall Cognitive Status: Within Functional Limits for tasks assessed                                          Exercises     Shoulder Instructions       General Comments  Pertinent Vitals/ Pain       Pain Assessment: Faces Faces Pain Scale: Hurts even more Pain Location: incisional Pain Descriptors / Indicators: Discomfort;Guarding;Grimacing;Sore Pain Intervention(s): Limited activity within patient's tolerance;Monitored during session;Premedicated before session;Repositioned  Home Living                                          Prior Functioning/Environment              Frequency  Min 2X/week        Progress Toward Goals  OT Goals(current goals can now be found in the care plan  section)  Progress towards OT goals: Progressing toward goals  Acute Rehab OT Goals Patient Stated Goal: home when able OT Goal Formulation: With patient Time For Goal Achievement: 05/04/20 Potential to Achieve Goals: Good ADL Goals Pt Will Perform Grooming: with supervision;standing Pt Will Perform Lower Body Bathing: with supervision;sitting/lateral leans;sit to/from stand;with adaptive equipment Pt Will Perform Upper Body Dressing: with set-up;sitting Pt Will Perform Lower Body Dressing: with supervision;with adaptive equipment;sit to/from stand;sitting/lateral leans Pt Will Transfer to Toilet: with supervision;ambulating Pt Will Perform Toileting - Clothing Manipulation and hygiene: with supervision;sit to/from stand;sitting/lateral leans Pt Will Perform Tub/Shower Transfer: Shower transfer;with min guard assist;ambulating;shower seat;rolling walker Additional ADL Goal #1: Pt will perform bed mobility with minguard assist as precursor to EOB/OOB ADL.  Plan Discharge plan remains appropriate    Co-evaluation                 AM-PAC OT "6 Clicks" Daily Activity     Outcome Measure   Help from another person eating meals?: None Help from another person taking care of personal grooming?: A Little Help from another person toileting, which includes using toliet, bedpan, or urinal?: A Lot Help from another person bathing (including washing, rinsing, drying)?: A Little Help from another person to put on and taking off regular upper body clothing?: A Little Help from another person to put on and taking off regular lower body clothing?: A Lot 6 Click Score: 17    End of Session Equipment Utilized During Treatment: Rolling walker;Back brace  OT Visit Diagnosis: Other abnormalities of gait and mobility (R26.89);Pain Pain - part of body:  (back)   Activity Tolerance Patient tolerated treatment well;Patient limited by lethargy;Patient limited by pain   Patient Left in bed;with  call bell/phone within reach   Nurse Communication Mobility status        Time: 9675-9163 OT Time Calculation (min): 20 min  Charges: OT General Charges $OT Visit: 1 Visit OT Treatments $Self Care/Home Management : 8-22 mins  Lou Cal, OT Acute Rehabilitation Services Pager [phone] Office [place] 04/21/2020, 11:15 AM

## 2020-04-21 NOTE — Progress Notes (Signed)
Physical Therapy Treatment Patient Details Name: [name] MRN: [id number] DOB: 1946/02/17 Today's Date: 04/21/2020    History of Present Illness Pt is a 74 y/o female now s/p PLIF, instrumentation and fusion L2-3. PMHx includes previous spinal sx, anxiety, DM, breast CA, depression, hx TKA, HTN    PT Comments    Pt progressing with post-op mobility. She was able to demonstrate transfers with up to mod assist and ambulation with min guard assist. Pt's granddaughter present this session for education and was present during stair training as well. Pt more lethargic today, requiring increased time to rouse and maintain eyes open for mobility. She endorses a "rough night" where she "didn't get any rest." Pt was educated on precautions, brace application/wearing schedule, appropriate activity progression, and car transfer. Will continue to follow.      Follow Up Recommendations  No PT follow up;Supervision for mobility/OOB     Equipment Recommendations  Rolling walker with 5" wheels (bariatric)    Recommendations for Other Services       Precautions / Restrictions Precautions Precautions: Fall;Back Precaution Booklet Issued: Yes (comment) Precaution Comments: reviewed throughout session Required Braces or Orthoses: Spinal Brace Spinal Brace: Lumbar corset;Applied in sitting position Restrictions Weight Bearing Restrictions: No    Mobility  Bed Mobility Overal bed mobility: Needs Assistance Bed Mobility: Rolling;Sidelying to Sit Rolling: Min assist Sidelying to sit: Mod assist       General bed mobility comments: Assist for transition fully to EOB while maintaining precautions. Granddaughter instructed in safe assist for trunk elevation to full sitting position.   Transfers Overall transfer level: Needs assistance Equipment used: Rolling walker (2 wheeled) Transfers: Sit to/from Stand Sit to Stand: Min assist         General transfer comment: VC's for hand  placement on seated surface for safety. Min assist required for power-up to full stand.   Ambulation/Gait Ambulation/Gait assistance: Min guard Gait Distance (Feet): 75 Feet Assistive device: Rolling walker (2 wheeled) Gait Pattern/deviations: Step-through pattern;Decreased stride length;Trunk flexed Gait velocity: Decreased Gait velocity interpretation: <1.31 ft/sec, indicative of household ambulator General Gait Details: Slow but generally steady with the RW. VC's for improved posture, closer walker proximity, and forward gaze.    Stairs Stairs: Yes Stairs assistance: Min assist Stair Management: Two rails;Step to pattern;Forwards Number of Stairs: 4 General stair comments: Pt fatiguing quickly with DOE noted during stair training. Pt was instructed in leading up with the strong LE and down with the weaker LE. Pt advancing up with RLE and down with LLE. Granddaughter present for education.    Wheelchair Mobility    Modified Rankin (Stroke Patients Only)       Balance Overall balance assessment: Needs assistance Sitting-balance support: Feet supported Sitting balance-Leahy Scale: Fair     Standing balance support: Bilateral upper extremity supported;No upper extremity supported;During functional activity Standing balance-Leahy Scale: Fair Standing balance comment: able to static stand at sink for grooming ADL with supervision                            Cognition Arousal/Alertness: Awake/alert Behavior During Therapy: WFL for tasks assessed/performed Overall Cognitive Status: Within Functional Limits for tasks assessed                                        Exercises      General Comments  Pertinent Vitals/Pain Pain Assessment: Faces Faces Pain Scale: Hurts little more Pain Location: incisional Pain Descriptors / Indicators: Discomfort;Guarding;Grimacing;Sore Pain Intervention(s): Monitored during session;Limited activity  within patient's tolerance;Repositioned    Home Living                      Prior Function            PT Goals (current goals can now be found in the care plan section) Acute Rehab PT Goals Patient Stated Goal: home when able PT Goal Formulation: With patient Time For Goal Achievement: 04/27/20 Potential to Achieve Goals: Good Progress towards PT goals: Progressing toward goals    Frequency    Min 5X/week      PT Plan Current plan remains appropriate    Co-evaluation              AM-PAC PT "6 Clicks" Mobility   Outcome Measure  Help needed turning from your back to your side while in a flat bed without using bedrails?: None Help needed moving from lying on your back to sitting on the side of a flat bed without using bedrails?: A Lot Help needed moving to and from a bed to a chair (including a wheelchair)?: A Little Help needed standing up from a chair using your arms (e.g., wheelchair or bedside chair)?: A Little Help needed to walk in hospital room?: A Little Help needed climbing 3-5 steps with a railing? : A Little 6 Click Score: 18    End of Session Equipment Utilized During Treatment: Gait belt;Back brace Activity Tolerance: Patient tolerated treatment well Patient left: Other (comment) (Sitting EOB with OT present) Nurse Communication: Mobility status PT Visit Diagnosis: Unsteadiness on feet (R26.81);Pain Pain - part of body:  (back)     Time: 0034-9179 PT Time Calculation (min) (ACUTE ONLY): 18 min  Charges:  $Gait Training: 8-22 mins                     Rolinda Roan, PT, DPT Acute Rehabilitation Services Pager: [phone] Office: [phone]    Thelma Comp 04/21/2020, 10:33 AM

## 2020-04-21 NOTE — Progress Notes (Signed)
Subjective: The patient is alert and pleasant.  Unfortunately, she tells me, her husband was admitted with a stroke last night.  Objective: Vital signs in last 24 hours: Temp:  [98.7 F (37.1 C)-100.8 F (38.2 C)] 98.7 F (37.1 C) (09/22 0803) Pulse Rate:  [90-110] 110 (09/22 0803) Resp:  [20] 20 (09/22 0803) BP: (98-150)/(57-68) 98/60 (09/22 0803) SpO2:  [92 %-100 %] 93 % (09/22 0803) Estimated body mass index is 45.18 kg/m as calculated from the following:   Height as of 04/16/20: 5\' 6"  (1.676 m).   Weight as of 04/16/20: 127 kg.   Intake/Output from previous day: No intake/output data recorded. Intake/Output this shift: No intake/output data recorded.  Physical exam the patient is alert and pleasant.  Her lower extremity strength is normal.  Lab Results: Recent Labs    04/20/20 0633  WBC 10.9*  HGB 9.3*  HCT 29.6*  PLT 212   BMET Recent Labs    04/20/20 0633  NA 137  K 4.3  CL 104  CO2 23  GLUCOSE 122*  BUN 15  CREATININE 1.48*  CALCIUM 9.5    Studies/Results: DG Lumbar Spine 2-3 Views  Result Date: 04/19/2020 CLINICAL DATA:  Posterior fusion EXAM: LUMBAR SPINE - 2-3 VIEW COMPARISON:  Dec 02, 2019 FINDINGS: The patient has undergone L2-L3 posterior fusion with a new interbody spacer. Again noted is variant lumbar vertebral anatomy as previously described. Again noted is posterior fusion hardware from L3 through L5. IMPRESSION: Posterior fusion as detailed above. Electronically Signed   By: Constance Holster M.D.   On: 04/19/2020 16:53   DG C-Arm 1-60 Min  Result Date: 04/19/2020 CLINICAL DATA:  Posterior fusion EXAM: DG C-ARM 1-60 MIN FLUOROSCOPY TIME:  Fluoroscopy Time:  14 seconds Radiation Exposure Index (if provided by the fluoroscopic device): 13.55 mGy Number of Acquired Spot Images: 2 COMPARISON:  Dec 03, 2019 FINDINGS: The patient has undergone L2-L3 posterior fusion with a new interbody spacer. Again noted is variant lumbar vertebral anatomy as  previously described. Again noted is posterior fusion hardware from L3 through L5. IMPRESSION: Posterior fusion as detailed above. Electronically Signed   By: Constance Holster M.D.   On: 04/19/2020 16:56    Assessment/Plan: Postop day #2: The patient is doing well and slowly progressing.  She tells me she has family members coming in on Friday to help her recuperate as her husband has been admitted to the hospital.  LOS: 2 days     [name] 04/21/2020, 11:21 AM     Patient ID: [name], female   DOB: Jan 24, 1946, 74 y.o. Jan 24, 1946, 74 y.o.   MRN: [id number]

## 2020-04-22 LAB — GLUCOSE, CAPILLARY: Glucose-Capillary: 138 mg/dL — ABNORMAL HIGH (ref 70–99)

## 2020-04-22 MED ORDER — CYCLOBENZAPRINE HCL 10 MG PO TABS: 10.0000 mg | ORAL_TABLET | Freq: Three times a day (TID) | ORAL | 0 refills | Status: DC | PRN

## 2020-04-22 MED ORDER — OXYCODONE HCL 5 MG PO TABS: 5.0000 mg | ORAL_TABLET | ORAL | 0 refills | Status: DC | PRN

## 2020-04-22 MED ORDER — DOCUSATE SODIUM 100 MG PO CAPS: 100.0000 mg | ORAL_CAPSULE | Freq: Two times a day (BID) | ORAL | 0 refills | Status: DC

## 2020-04-22 MED FILL — Sodium Chloride IV Soln 0.9%: INTRAVENOUS | Qty: 1000 | Status: AC

## 2020-04-22 MED FILL — Heparin Sodium (Porcine) Inj 1000 Unit/ML: INTRAMUSCULAR | Qty: 30 | Status: AC

## 2020-04-22 NOTE — Progress Notes (Signed)
Pt and granddaughter given D/C instructions with verbal understanding. Rx's were sent to the pharmacy by MD. Pt's incision is clean and dry with no sign of infection. Pt's IV was removed prior to D/C. Pt received bari RW from [employer] per MD order prior to D/C. Pt D/C'd home via wheelchair per MD order. Pt is stable @ D/C and has no other needs at this time. Holli Humbles, RN

## 2020-04-22 NOTE — Discharge Summary (Signed)
Physician Discharge Summary     Providing Compassionate, Quality Care - Together   Patient ID: [name] MRN: [id number] DOB/AGE: 76/23/1947 76 y.o.  Admit date: 04/19/2020 Discharge date: 04/22/2020  Admission Diagnoses: Spinal stenosis of lumbar region with neurogenic claudication  Discharge Diagnoses:  Active Problems:   Spinal stenosis of lumbar region with neurogenic claudication   Discharged Condition: good  Hospital Course: Patient underwent an L2-3 posterior lumbar interbody fusion with posterior segmental instrumentation from L2-L5 by Dr. Arnoldo Morale on 04/19/2020. She was admitted to [facility] following recovery from anesthesia in the PACU. Her postoperative course has been uncomplicated. She has worked with both physical and occupational therapies who feels she is ready for discharge home. She is ambulating independently with a rolling walker. She is tolerating a normal diet. She is not having any bowel or bladder dysfunction. Her pain is well-controlled with oral pain medication. She is ready for discharge home.   Consults: rehabilitation medicine  Significant Diagnostic Studies: radiology: DG Lumbar Spine 2-3 Views  Result Date: 04/19/2020 CLINICAL DATA:  Posterior fusion EXAM: LUMBAR SPINE - 2-3 VIEW COMPARISON:  Dec 02, 2019 FINDINGS: The patient has undergone L2-L3 posterior fusion with a new interbody spacer. Again noted is variant lumbar vertebral anatomy as previously described. Again noted is posterior fusion hardware from L3 through L5. IMPRESSION: Posterior fusion as detailed above. Electronically Signed   By: Constance Holster M.D.   On: 04/19/2020 16:53   DG C-Arm 1-60 Min  Result Date: 04/19/2020 CLINICAL DATA:  Posterior fusion EXAM: DG C-ARM 1-60 MIN FLUOROSCOPY TIME:  Fluoroscopy Time:  14 seconds Radiation Exposure Index (if provided by the fluoroscopic device): 13.55 mGy Number of Acquired Spot Images: 2 COMPARISON:  Dec 03, 2019 FINDINGS: The patient has  undergone L2-L3 posterior fusion with a new interbody spacer. Again noted is variant lumbar vertebral anatomy as previously described. Again noted is posterior fusion hardware from L3 through L5. IMPRESSION: Posterior fusion as detailed above. Electronically Signed   By: Constance Holster M.D.   On: 04/19/2020 16:56     Treatments: surgery:  Bilateral L2-3 laminotomy/foraminotomies/medial facetectomy to decompress the bilateral L2 and L3 nerve roots(the work required to do this was in addition to the work required to do the posterior lumbar interbody fusion because of the patient's spinal stenosis, facet arthropathy. Etc. requiring a wide decompression of the nerve roots.);  L2-3 transforaminal lumbar interbody fusion with bone morphogenic protein soaked collagen sponges, local morselized autograft bone and Zimmer DBM; insertion of interbody prosthesis at L2-3 (globus peek expandable interbody prosthesis); posterior segmental instrumentation from L2 to L5 with globus titanium pedicle screws and rods; posterior lateral arthrodesis at L2-3 and redo L3-4, and L4-5 with bone morphogenic protein soaked collagen sponges, local morselized autograft bone and Zimmer DBM; exploration of lumbar fusion/removal of lumbar hardware  Discharge Exam: Blood pressure 126/64, pulse 94, temperature 99.5 F (37.5 C), temperature source Oral, resp. rate 16, SpO2 93 %.   Alert and oriented x 4 PERRLA CN II-XII grossly intact MAE, Strength and sensation intact Incision is covered with Honeycomb dressing and Steri Strips; Dressing is clean, dry, and intact   Disposition: Discharge disposition: 01-Home or Self Care       Discharge Instructions    For home use only DME Walker rolling   Complete by: As directed    Bariatric   Walker: With [place] Wheels   Patient needs a walker to treat with the following condition: Spinal stenosis     Allergies as  of 04/22/2020      Reactions   Pregabalin    Other reaction(s):  Dizziness   Cymbalta [duloxetine Hcl] Other (See Comments)   Stomach pain   Gabapentin Itching   Lipitor [atorvastatin] Other (See Comments)   Cramps   Lisinopril Swelling   Maxzide [hydrochlorothiazide W-triamterene] Other (See Comments)   Cramping   Niacin Itching   Pravastatin Sodium Other (See Comments)   Headache   Simvastatin Other (See Comments)   Memory loss   Lodine [etodolac] Itching      Medication List    TAKE these medications   acetaminophen 500 MG tablet Commonly known as: TYLENOL Take 1,000 mg by mouth 2 (two) times daily as needed for headache.   amitriptyline 25 MG tablet Commonly known as: ELAVIL Take 25 mg by mouth every morning.   aspirin 81 MG tablet Take 81 mg by mouth daily.   atorvastatin 20 MG tablet Commonly known as: LIPITOR Take 20 mg by mouth daily.   blood glucose meter kit and supplies Kit Dispense based on patient and insurance preference. Use up to four times daily as directed. (FOR ICD-9 250.00, 250.01).   cyclobenzaprine 10 MG tablet Commonly known as: FLEXERIL Take 1 tablet (10 mg total) by mouth 3 (three) times daily as needed for muscle spasms.   docusate sodium 100 MG capsule Commonly known as: COLACE Take 1 capsule (100 mg total) by mouth 2 (two) times daily.   escitalopram 20 MG tablet Commonly known as: LEXAPRO Take 20 mg by mouth daily.   ezetimibe 10 MG tablet Commonly known as: ZETIA Take 10 mg by mouth at bedtime.   Fish Oil 1200 MG Caps Take 1,200 mg by mouth 2 (two) times daily.   fluticasone 0.05 % cream Commonly known as: CUTIVATE Apply 1 application topically daily as needed (itching).   hydrochlorothiazide 25 MG tablet Commonly known as: HYDRODIURIL Take 1 tablet (25 mg total) by mouth daily.   irbesartan 300 MG tablet Commonly known as: AVAPRO Take 1 tablet (300 mg total) by mouth at bedtime.   metFORMIN 500 MG tablet Commonly known as: GLUCOPHAGE Take 1 tablet (500 mg total) by mouth daily  with breakfast.   oxyCODONE 5 MG immediate release tablet Commonly known as: Oxy IR/ROXICODONE Take 1-2 tablets (5-10 mg total) by mouth every 4 (four) hours as needed for moderate pain or severe pain ((score 4 to 6)).   verapamil 240 MG 24 hr capsule Commonly known as: VERELAN PM Take 240 mg by mouth daily.   vitamin B-12 1000 MCG tablet Commonly known as: CYANOCOBALAMIN Take 1,000 mcg by mouth at bedtime.   Vitamin D (Cholecalciferol) 25 MCG (1000 UT) Caps Take 1,000 Units by mouth at bedtime.   Vitamin D (Ergocalciferol) 1.25 MG (50000 UNIT) Caps capsule Commonly known as: DRISDOL Take 1 capsule (50,000 Units total) by mouth every 7 (seven) days.            Durable Medical Equipment  (From admission, onward)         Start     Ordered   04/22/20 0000  For home use only DME Walker rolling       Comments: Bariatric  Question Answer Comment  Walker: With 5 Inch Wheels   Patient needs a walker to treat with the following condition Spinal stenosis      04/22/20 5701          Follow-up Information    Newman Pies, MD. Schedule an appointment as soon as possible  for a visit in 2 week(s).   Specialty: Neurosurgery Contact information: [address] [place] [address] [place] [address] [phone]               Signed: Patricia Nettle 04/22/2020, 8:56 AM

## 2020-04-22 NOTE — Progress Notes (Signed)
Physical Therapy Treatment Patient Details Name: [name] MRN: [id number] DOB: Feb 26, 1946 Today's Date: 04/22/2020    History of Present Illness Pt is a 74 y/o female now s/p PLIF, instrumentation and fusion L2-3. PMHx includes previous spinal sx, anxiety, DM, breast CA, depression, hx TKA, HTN    PT Comments    Pt progressing with post-op mobility. She was able to demonstrate transfers with up to mod assist and ambulation with gross min guard assist. Pt still with difficulty sequencing through the log roll and required multi-modal cues several times throughout session to safely navigate mobility. Pt was educated on precautions, brace application/wearing schedule, appropriate activity progression, and car transfer. Will continue to follow.     Follow Up Recommendations  No PT follow up;Supervision/Assistance - 24 hour     Equipment Recommendations  Rolling walker with 5" wheels (bariatric)    Recommendations for Other Services       Precautions / Restrictions Precautions Precautions: Fall;Back Precaution Booklet Issued: Yes (comment) Precaution Comments: reviewed throughout session Required Braces or Orthoses: Spinal Brace Spinal Brace: Lumbar corset;Applied in sitting position Restrictions Weight Bearing Restrictions: No    Mobility  Bed Mobility Overal bed mobility: Needs Assistance Bed Mobility: Rolling;Sit to Sidelying Rolling: Min assist Sidelying to sit: Mod assist     Sit to sidelying: Mod assist General bed mobility comments: multimodal cueing for use of log roll technique; assist for trunk elevation to full sitting and for LEs onto EOB at end of session.   Transfers Overall transfer level: Needs assistance Equipment used: Rolling walker (2 wheeled) Transfers: Sit to/from Stand Sit to Stand: Min guard         General transfer comment: VC's for hand placement on seated surface for safety. No assist required for power-up to full stand.    Ambulation/Gait Ambulation/Gait assistance: Min guard Gait Distance (Feet): 200 Feet Assistive device: Rolling walker (2 wheeled) Gait Pattern/deviations: Step-through pattern;Decreased stride length;Trunk flexed Gait velocity: Decreased Gait velocity interpretation: <1.31 ft/sec, indicative of household ambulator General Gait Details: Slow but generally steady with the RW. VC's for improved posture, closer walker proximity, and forward gaze.    Stairs Stairs: Yes Stairs assistance: Min assist Stair Management: Two rails;Step to pattern;Forwards Number of Stairs: 7 General stair comments: VC's for sequencing and general safety. Pt attempting to step up onto the first stair from a far distance. Multimodal cues to step up close to the stair prior to attempting to advance up. Pt with apparent slow processing and not making changes. I had to physically stop her from trying to step up until she was closer to avoid a fall.    Wheelchair Mobility    Modified Rankin (Stroke Patients Only)       Balance Overall balance assessment: Needs assistance Sitting-balance support: Feet supported Sitting balance-Leahy Scale: Fair     Standing balance support: Bilateral upper extremity supported;No upper extremity supported;During functional activity Standing balance-Leahy Scale: Fair Standing balance comment: able to static stand at sink for grooming ADL with supervision                            Cognition Arousal/Alertness: Awake/alert Behavior During Therapy: WFL for tasks assessed/performed Overall Cognitive Status: Impaired/Different from baseline Area of Impairment: Safety/judgement;Problem solving;Memory                     Memory: Decreased recall of precautions   Safety/Judgement: Decreased awareness of safety   Problem Solving:  Slow processing;Requires verbal cues;Requires tactile cues;Difficulty sequencing General Comments: Requires frequent repetition  of cues for safety and for sequencing. Pt saying "uh huh" after given an instruction but not making any corrective changes.       Exercises      General Comments        Pertinent Vitals/Pain Pain Assessment: Faces Faces Pain Scale: Hurts a little bit Pain Location: incisional Pain Descriptors / Indicators: Discomfort;Guarding;Grimacing;Sore Pain Intervention(s): Monitored during session    Home Living                      Prior Function            PT Goals (current goals can now be found in the care plan section) Acute Rehab PT Goals Patient Stated Goal: home when able PT Goal Formulation: With patient Time For Goal Achievement: 04/27/20 Potential to Achieve Goals: Good Progress towards PT goals: Progressing toward goals    Frequency    Min 5X/week      PT Plan Current plan remains appropriate    Co-evaluation              AM-PAC PT "6 Clicks" Mobility   Outcome Measure  Help needed turning from your back to your side while in a flat bed without using bedrails?: None Help needed moving from lying on your back to sitting on the side of a flat bed without using bedrails?: A Lot Help needed moving to and from a bed to a chair (including a wheelchair)?: A Little Help needed standing up from a chair using your arms (e.g., wheelchair or bedside chair)?: A Little Help needed to walk in hospital room?: A Little Help needed climbing 3-5 steps with a railing? : A Little 6 Click Score: 18    End of Session Equipment Utilized During Treatment: Gait belt;Back brace Activity Tolerance: Patient tolerated treatment well Patient left: in bed;with call bell/phone within reach Nurse Communication: Mobility status PT Visit Diagnosis: Unsteadiness on feet (R26.81);Pain Pain - part of body:  (back)     Time: 2297-9892 PT Time Calculation (min) (ACUTE ONLY): 24 min  Charges:  $Gait Training: 23-37 mins                     [name], PT, DPT Acute  Rehabilitation Services Pager: [phone] Office: [phone]    Thelma Comp 04/22/2020, 9:23 AM

## 2020-04-22 NOTE — Progress Notes (Signed)
Occupational Therapy Treatment Patient Details Name: [name] MRN: [id number] DOB: 1946-05-20 Today's Date: 04/22/2020    History of present illness Pt is a 74 y/o female now s/p PLIF, instrumentation and fusion L2-3. PMHx includes previous spinal sx, anxiety, DM, breast CA, depression, hx TKA, HTN   OT comments  Pt progressing towards acute OT goals. Reviewed compensatory strategies, DME and AE to facilitate ADLs while maintaining back precautions. Granddaughter present throughout session. Pt plans to d/c home with granddaughter today. D/c plan remains appropriate.    Follow Up Recommendations  No OT follow up;Supervision/Assistance - 24 hour    Equipment Recommendations  None recommended by OT    Recommendations for Other Services      Precautions / Restrictions Precautions Precautions: Fall;Back Precaution Booklet Issued: Yes (comment) Precaution Comments: reviewed throughout session Required Braces or Orthoses: Spinal Brace Spinal Brace: Lumbar corset;Applied in sitting position Restrictions Weight Bearing Restrictions: No       Mobility Bed Mobility Overal bed mobility: Needs Assistance Bed Mobility: Rolling;Sit to Sidelying Rolling: Min assist Sidelying to sit: Mod assist     Sit to sidelying: Mod assist General bed mobility comments: sitting EOB  Transfers Overall transfer level: Needs assistance Equipment used: Rolling walker (2 wheeled) Transfers: Sit to/from Stand Sit to Stand: Min guard         General transfer comment: VC's for hand placement on seated surface for safety. No assist required for power-up to full stand.     Balance Overall balance assessment: Needs assistance Sitting-balance support: Feet supported Sitting balance-Leahy Scale: Fair     Standing balance support: Bilateral upper extremity supported;No upper extremity supported;During functional activity Standing balance-Leahy Scale: Fair Standing balance comment: able to  static stand at sink for grooming ADL with supervision                           ADL either performed or assessed with clinical judgement   ADL Overall ADL's : Needs assistance/impaired                                       General ADL Comments: Reviewed compensatory strategies and AE for LB ADLs, pericare, and showering. Granddaughter present throughout session. Pt reports granddaughter helped with LB dressing today.      Vision       Perception     Praxis      Cognition Arousal/Alertness: Awake/alert Behavior During Therapy: WFL for tasks assessed/performed Overall Cognitive Status: Within Functional Limits for tasks assessed Area of Impairment: Safety/judgement;Problem solving;Memory                     Memory: Decreased recall of precautions   Safety/Judgement: Decreased awareness of safety   Problem Solving: Slow processing;Requires verbal cues;Requires tactile cues;Difficulty sequencing General Comments: Internally distracted        Exercises     Shoulder Instructions       General Comments      Pertinent Vitals/ Pain       Pain Assessment: Faces Faces Pain Scale: Hurts a little bit Pain Location: incisional Pain Descriptors / Indicators: Discomfort;Guarding;Grimacing;Sore Pain Intervention(s): Monitored during session;Repositioned;Limited activity within patient's tolerance  Home Living  Prior Functioning/Environment              Frequency  Min 2X/week        Progress Toward Goals  OT Goals(current goals can now be found in the [name] plan section)  Progress towards OT goals: Progressing toward goals  Acute Rehab OT Goals Patient Stated Goal: home when able OT Goal Formulation: With patient Time For Goal Achievement: 05/04/20 Potential to Achieve Goals: Good ADL Goals Pt Will Perform Grooming: with supervision;standing Pt Will Perform Lower  Body Bathing: with supervision;sitting/lateral leans;sit to/from stand;with adaptive equipment Pt Will Perform Upper Body Dressing: with set-up;sitting Pt Will Perform Lower Body Dressing: with supervision;with adaptive equipment;sit to/from stand;sitting/lateral leans Pt Will Transfer to Toilet: with supervision;ambulating Pt Will Perform Toileting - Clothing Manipulation and hygiene: with supervision;sit to/from stand;sitting/lateral leans Pt Will Perform Tub/Shower Transfer: Shower transfer;with min guard assist;ambulating;shower seat;rolling walker Additional ADL Goal #1: Pt will perform bed mobility with minguard assist as precursor to EOB/OOB ADL.  Plan Discharge plan remains appropriate    Co-evaluation                 AM-PAC OT "6 Clicks" Daily Activity     Outcome Measure   Help from another person eating meals?: None Help from another person taking [name] of personal grooming?: A Little Help from another person toileting, which includes using toliet, bedpan, or urinal?: A Little Help from another person bathing (including washing, rinsing, drying)?: A Little Help from another person to put on and taking off regular upper body clothing?: A Little Help from another person to put on and taking off regular lower body clothing?: A Little 6 Click Score: 19    End of Session Equipment Utilized During Treatment: Rolling walker;Back brace  OT Visit Diagnosis: Other abnormalities of gait and mobility (R26.89);Pain   Activity Tolerance Patient tolerated treatment well   Patient Left with call bell/phone within reach;with family/visitor present;Other (comment) (sitting EOB)   Nurse Communication          Time: 8938-1017 OT Time Calculation (min): 9 min  Charges: OT General Charges $OT Visit: 1 Visit OT Treatments $Self [name]/Home Management : 8-22 mins  [name], OT Acute Rehabilitation Services Pager: [phone] Office: [phone]    [name] 04/22/2020, 10:24 AM

## 2020-04-22 NOTE — Discharge Instructions (Signed)
Wound Care Keep incision covered and dry for two days.    Do not put any creams, lotions, or ointments on incision. Leave steri-strips on back.  They will fall off by themselves. Activity Walk each and every day, increasing distance each day. No lifting greater than 5 lbs.  No driving for 2 weeks; may ride as a passenger locally.  Diet Resume your normal diet.  Return to Work Will be discussed at your follow up appointment. Call Your Doctor If Any of These Occur Redness, drainage, or swelling at the wound.  Temperature greater than 101 degrees. Severe pain not relieved by pain medication. Incision starts to come apart. Follow Up Appt Call today for appointment in 1-2 weeks ([phone]) or for problems.  If you have any hardware placed in your spine, you will need an x-ray before your appointment.  

## 2020-04-29 ENCOUNTER — Encounter (HOSPITAL_COMMUNITY): Payer: Self-pay | Admitting: Neurosurgery

## 2020-04-30 ENCOUNTER — Encounter (HOSPITAL_COMMUNITY): Payer: Self-pay | Admitting: Neurosurgery

## 2020-05-19 ENCOUNTER — Other Ambulatory Visit (INDEPENDENT_AMBULATORY_CARE_PROVIDER_SITE_OTHER): Payer: Self-pay | Admitting: Physician Assistant

## 2020-05-19 DIAGNOSIS — E119 Type 2 diabetes mellitus without complications: Secondary | ICD-10-CM

## 2020-06-30 ENCOUNTER — Ambulatory Visit (INDEPENDENT_AMBULATORY_CARE_PROVIDER_SITE_OTHER): Payer: Medicare Other | Admitting: Pulmonary Disease

## 2020-06-30 ENCOUNTER — Other Ambulatory Visit: Payer: Self-pay

## 2020-06-30 ENCOUNTER — Other Ambulatory Visit (INDEPENDENT_AMBULATORY_CARE_PROVIDER_SITE_OTHER): Payer: Self-pay | Admitting: Physician Assistant

## 2020-06-30 ENCOUNTER — Encounter: Payer: Self-pay | Admitting: Pulmonary Disease

## 2020-06-30 VITALS — BP 142/78 | HR 71 | Temp 97.4°F | Ht 66.0 in | Wt 277.8 lb

## 2020-06-30 DIAGNOSIS — G4733 Obstructive sleep apnea (adult) (pediatric): Secondary | ICD-10-CM

## 2020-06-30 DIAGNOSIS — G478 Other sleep disorders: Secondary | ICD-10-CM | POA: Diagnosis not present

## 2020-06-30 DIAGNOSIS — Z9989 Dependence on other enabling machines and devices: Secondary | ICD-10-CM | POA: Diagnosis not present

## 2020-06-30 DIAGNOSIS — G4730 Sleep apnea, unspecified: Secondary | ICD-10-CM | POA: Diagnosis not present

## 2020-06-30 DIAGNOSIS — E669 Obesity, unspecified: Secondary | ICD-10-CM

## 2020-06-30 DIAGNOSIS — E119 Type 2 diabetes mellitus without complications: Secondary | ICD-10-CM

## 2020-06-30 NOTE — Telephone Encounter (Signed)
Last seen by Tracey 

## 2020-06-30 NOTE — Patient Instructions (Signed)
Follow up in 1 year.

## 2020-06-30 NOTE — Progress Notes (Signed)
[facility]  Chief Complaint  Patient presents with  . Follow-up    intermittent cough at night     Constitutional:  BP (!) 142/78 (BP Location: Left Arm, Cuff Size: Normal)   Pulse 71   Temp (!) 97.4 F (36.3 C) (Other (Comment)) Comment (Src): wrist  Ht 5' 6" (1.676 m)   Wt 277 lb 12.8 oz (126 kg)   SpO2 99% Comment: Room air  BMI 44.84 kg/m   Past Medical History:  HLD, HTN, HA, Anemia, Spinal stenosis, Breast cancer 2013, Depression  Past Surgical History:  Her  has a past surgical history that includes Total knee arthroplasty (2006); Polypectomy (1990's); Appendectomy (1978); Thyroid cyst excision (1994); Transphenoidal / transnasal hypophysectomy / resection pituitary tumor (6/11); Blepharoptosis repair; Joint replacement (Bilateral); Colonoscopy w/ polypectomy; Breast lumpectomy with needle localization (Right, 02/06/2013); Hardware Removal (Left, 04/20/2015); Colonoscopy with propofol (N/A, 10/27/2015); Back surgery; Breast lumpectomy (08/2011); and Breast excisional biopsy (Right, 02/06/2013).  Brief Summary:  [name] is a 74 y.o. female with obstructive sleep apnea.      Subjective:   She is doing well with CPAP.  Uses every night.  No issues with mask fit.  Not having sinus congestion, sore throat, dry mouth, or aerophagia.  Gets occasional dry cough at night.  This seems to happen when her heater kicks on.  She takes a cough pill and feels better.  She doesn't think this is too much of an issue at present.  She had back surgery again.  Still having back pain.  Physical Exam:   Appearance - well kempt   ENMT - no sinus tenderness, no oral exudate, no LAN, Mallampati 3 airway, no stridor  Respiratory - equal breath sounds bilaterally, no wheezing or rales  CV - s1s2 regular rate and rhythm, no murmurs  Ext - no clubbing, no edema  Skin - no rashes  Psych - normal mood and affect   Sleep Tests:   PSG  08/05/04 >> AHI 139  Auto CPAP 05/30/20 to 06/28/20 >> used on 30 of 30 nights with average 8 hrs 4 min.  Average AHI 4 with median CPAP 9 and 95 th percentile CPAP 11 cm H2O  Social History:  She  reports that she has never smoked. She has never used smokeless tobacco. She reports that she does not drink alcohol and does not use drugs.  Family History:  Her family history includes Breast cancer (age of onset: 24) in her sister; Cancer (age of onset: 85) in her sister; Clotting disorder in her father; Diabetes in her mother; Heart Problems in her father; Heart disease in her father; Hyperlipidemia in her mother; Hypertension in her father and mother; Obesity in her mother; Stroke in her father and mother.     Assessment/Plan:   Obstructive sleep apnea. - she is compliant with CPAP and reports benefit from therapy - uses [employer] for her DME - continue auto CPAP 5 to 20 cm H2O  Sleep paralysis. - likely related to arousals during sleep apnea events - resolved with CPAP therapy  Obesity. - she is aware of how her weight can impact her health, particularly in relation to sleep apnea  Back pain. - she will f/u with Dr. Newman Pies with neurosurgery  Time Spent Involved in Patient Care on Day of Examination:  21 minutes  Follow up:  Patient Instructions  Follow up in 1 year   Medication List:   Allergies as of 06/30/2020  Reactions   Pregabalin    Other reaction(s): Dizziness   Cymbalta [duloxetine Hcl] Other (See Comments)   Stomach pain   Gabapentin Itching   Lipitor [atorvastatin] Other (See Comments)   Cramps   Lisinopril Swelling   Maxzide [hydrochlorothiazide W-triamterene] Other (See Comments)   Cramping   Niacin Itching   Pravastatin Sodium Other (See Comments)   Headache   Simvastatin Other (See Comments)   Memory loss   Lodine [etodolac] Itching      Medication List       Accurate as of June 30, 2020 10:12 AM. If you have any questions, ask  your nurse or doctor.        acetaminophen 500 MG tablet Commonly known as: TYLENOL Take 1,000 mg by mouth 2 (two) times daily as needed for headache.   amitriptyline 25 MG tablet Commonly known as: ELAVIL Take 25 mg by mouth every morning.   aspirin 81 MG tablet Take 81 mg by mouth daily.   atorvastatin 20 MG tablet Commonly known as: LIPITOR Take 20 mg by mouth daily.   blood glucose meter kit and supplies Kit Dispense based on patient and insurance preference. Use up to four times daily as directed. (FOR ICD-9 250.00, 250.01).   cyclobenzaprine 10 MG tablet Commonly known as: FLEXERIL Take 1 tablet (10 mg total) by mouth 3 (three) times daily as needed for muscle spasms.   docusate sodium 100 MG capsule Commonly known as: COLACE Take 1 capsule (100 mg total) by mouth 2 (two) times daily.   escitalopram 20 MG tablet Commonly known as: LEXAPRO Take 20 mg by mouth daily.   ezetimibe 10 MG tablet Commonly known as: ZETIA Take 10 mg by mouth at bedtime.   Fish Oil 1200 MG Caps Take 1,200 mg by mouth 2 (two) times daily.   fluticasone 0.05 % cream Commonly known as: CUTIVATE Apply 1 application topically daily as needed (itching).   hydrochlorothiazide 25 MG tablet Commonly known as: HYDRODIURIL Take 1 tablet (25 mg total) by mouth daily.   irbesartan 300 MG tablet Commonly known as: AVAPRO Take 1 tablet (300 mg total) by mouth at bedtime.   metFORMIN 500 MG tablet Commonly known as: GLUCOPHAGE Take 1 tablet (500 mg total) by mouth daily with breakfast.   oxyCODONE 5 MG immediate release tablet Commonly known as: Oxy IR/ROXICODONE Take 1-2 tablets (5-10 mg total) by mouth every 4 (four) hours as needed for moderate pain or severe pain ((score 4 to 6)).   verapamil 240 MG 24 hr capsule Commonly known as: VERELAN PM Take 240 mg by mouth daily.   vitamin B-12 1000 MCG tablet Commonly known as: CYANOCOBALAMIN Take 1,000 mcg by mouth at bedtime.   Vitamin  D (Cholecalciferol) 25 MCG (1000 UT) Caps Take 1,000 Units by mouth at bedtime.   Vitamin D (Ergocalciferol) 1.25 MG (50000 UNIT) Caps capsule Commonly known as: DRISDOL Take 1 capsule (50,000 Units total) by mouth every 7 (seven) days.       Signature:  Chesley Mires, MD [place] Pager - [phone] 06/30/2020, 10:12 AM

## 2020-07-20 ENCOUNTER — Ambulatory Visit: Payer: Medicare Other | Attending: Student

## 2020-07-20 ENCOUNTER — Other Ambulatory Visit: Payer: Self-pay

## 2020-07-20 DIAGNOSIS — G8929 Other chronic pain: Secondary | ICD-10-CM | POA: Insufficient documentation

## 2020-07-20 DIAGNOSIS — R262 Difficulty in walking, not elsewhere classified: Secondary | ICD-10-CM | POA: Insufficient documentation

## 2020-07-20 DIAGNOSIS — M6281 Muscle weakness (generalized): Secondary | ICD-10-CM | POA: Diagnosis present

## 2020-07-20 DIAGNOSIS — M545 Low back pain, unspecified: Secondary | ICD-10-CM | POA: Diagnosis present

## 2020-07-20 DIAGNOSIS — M25551 Pain in right hip: Secondary | ICD-10-CM | POA: Insufficient documentation

## 2020-07-21 NOTE — Therapy (Addendum)
[facility] [place], [place], [address] Phone: [phone]   Fax:  [phone]  Physical Therapy Evaluation/Discharge  Patient Details  Name: [name] MRN: [id number] Date of Birth: March 10, 1946 Referring Provider (PT): Viona Gilmore D, NP   Encounter Date: 07/20/2020   PT End of Session - 07/20/20 1156    Visit Number 1    Number of Visits 13    Date for PT Re-Evaluation 09/04/20    Authorization Type [place] visit 6 and visit 10; PN visit 10; KX modifier visit 15    PT Start Time 1130    PT Stop Time 1214    PT Time Calculation (min) 44 min    Activity Tolerance Patient tolerated treatment well    Behavior During Therapy [facility] for tasks assessed/performed           Past Medical History:  Diagnosis Date  . Anemia   . Anxiety    takes Xanax daily  . Arthritis   . Back pain   . Blood transfusion   . Breast cancer ([place]) 2013   left breast  . Bronchitis    hx of  . Chronic fatigue syndrome   . Depression    takes Lexapro daily  . Diabetes mellitus without complication ([place])    boderline  . Diverticulosis   . Edema    feet and legs  . Headache(784.0)   . History of colon polyps 1998   adenomatous  . Hyperlipidemia    takes Niacin daily  . Hypertension    takes Verapamil and Avapro daily  . Joint pain   . OSA (obstructive sleep apnea)   . Personal history of radiation therapy   . Pneumonia 4/12   Albuterol daily as needed  . Radiation 10/29/11-11/24/11   left breast 6100 cGy  . Shortness of breath    with exertion  . Spinal stenosis   . Tinnitus     Past Surgical History:  Procedure Laterality Date  . APPENDECTOMY  1978  . BACK SURGERY    . BELPHAROPTOSIS REPAIR    . BREAST EXCISIONAL BIOPSY Right 02/06/2013  . BREAST LUMPECTOMY  08/2011   left  . BREAST LUMPECTOMY WITH NEEDLE LOCALIZATION Right 02/06/2013   Procedure: RIGHT BREAST LUMPECTOMY WITH NEEDLE  LOCALIZATION;  Surgeon: Harl Bowie, MD;  Location: [facility];  Service: General;  Laterality: Right;  . COLONOSCOPY W/ POLYPECTOMY    . COLONOSCOPY WITH PROPOFOL N/A 10/27/2015   Procedure: COLONOSCOPY WITH PROPOFOL;  Surgeon: Mauri Pole, MD;  Location: [facility];  Service: Endoscopy;  Laterality: N/A;  . HARDWARE REMOVAL Left 04/20/2015   Procedure: HARDWARE REMOVAL LEFT LUMBAR FIVE SCREW;  Surgeon: Karie Chimera, MD;  Location: [facility];  Service: Neurosurgery;  Laterality: Left;  . JOINT REPLACEMENT Bilateral    bilateral knee  . POLYPECTOMY  1990's  . THYROID CYST EXCISION  1994  . TOTAL KNEE ARTHROPLASTY  2006   bilateral  . TRANSPHENOIDAL / TRANSNASAL HYPOPHYSECTOMY / RESECTION PITUITARY TUMOR  6/11    There were no vitals filed for this visit.    Subjective Assessment - 07/20/20 1133    Subjective "It feels like I never had surgery. When I take oxycodone, I feel okay for 2-3 hours. When I'm sitting or laying down (on left side), I feel fine. I'm not comfortable laying on my right side because I get cramps in my left leg. I have an alert bracelet but forgot to wear  on Sunday. I was home alone and stumbled over the coffee table and fell. It made me feel uncomfortable in the lower part of my stomach. I didn't hit anywhere except my knee and I've had left knee replacement, so that was uncomfortable."    Pertinent History See extensive PMH above    Limitations Standing;Walking;Lifting;House hold activities    How long can you sit comfortably? "I'm fine when I'm sitting"    How long can you stand comfortably? "Less than 10 minutes"    How long can you walk comfortably? "If I have something to hold onto, I'm okay. I have stairs in my house and go up and down 4-5 times a day but couldn't do it without the railings. I walk to the car but couldn't do much more than that"    Diagnostic tests 04/19/2020 Lumbar: The patient has undergone L2-L3 posterior fusion with a new  interbody  spacer. Again noted is variant lumbar vertebral anatomy as  previously described. Again noted is posterior fusion hardware from  L3 through L5.    Patient Stated Goals "Not have the pain and balance to be better"    Currently in Pain? No/denies    Pain Score 0-No pain    Pain Radiating Towards When walking, has pain in lumbar spine and to each side    Pain Onset More than a month ago    Pain Frequency Intermittent    Aggravating Factors  Walking and standing    Pain Relieving Factors Pain medication, sleeping on L side    Effect of Pain on Daily Activities Difficulty performing tasks due to limitations with standing and walking              [facility] PT Assessment - 07/21/20 0001      Assessment   Medical Diagnosis Arthrodesis status (Z98.1)    Referring Provider (PT) Patricia Nettle, NP    Onset Date/Surgical Date 04/19/20    Hand Dominance Right    Next MD Visit 08/03/2020    Prior Therapy Yes about 4 years ago after initial back surgery and several years ago after L TKA      Precautions   Precautions None      Restrictions   Weight Bearing Restrictions No      Balance Screen   Has the patient fallen in the past 6 months Yes    How many times? 1   This past Sunday tripping over the coffee table   Has the patient had a decrease in activity level because of a fear of falling?  Yes    Is the patient reluctant to leave their home because of a fear of falling?  Yes      [place] Private residence    Living Arrangements Spouse/significant other   husband but he is gone often, so pt is home alone most of each day   Available Help at Discharge Other (Comment)   husband has health issues and weakness so unable to help much   Type of [place] to enter    Entrance Stairs-Number of Steps 3    Home Layout Multi-level    Alternate Level Stairs-Number of Steps 7   7 steps to get to bedroom upstairs and 7 steps to get to laundry room    Alternate Level Stairs-Rails Right   R side going upstairs to room; R side going downstairs to laundry room   Home Equipment  Walker - 2 wheels;Cane - single point;Shower seat;Grab bars - tub/shower   rollator   Additional Comments Pt did not have an AD at time of evaluation.      Prior Function   Level of Independence Independent    Vocation Retired    [employer] Had a part time job before [employer] "I don't really have anything I do for fun. I had a group of friends when I lived in [place]. It all stopped when I came here."      Cognition   Overall Cognitive Status Within Functional Limits for tasks assessed      Observation/Other Assessments   Focus on Therapeutic Outcomes (FOTO)  44% functional ability; predicted 53% function      ROM / Strength   AROM / PROM / Strength AROM;Strength      AROM   Overall AROM Comments AROM painful and pt with BUE support on mat due to decreased balance    AROM Assessment Site Lumbar    Lumbar Flexion 70    Lumbar Extension 10    Lumbar - Right Side Bend 1 inch above lateral knee joint    Lumbar - Left Side Bend 1 inch above lateral knee joint      Strength   Strength Assessment Site Hip;Knee;Ankle    Right/Left Hip Right;Left    Right Hip Flexion 4/5    Right Hip ABduction 4/5    Right Hip ADduction 4/5    Left Hip Flexion 4+/5    Left Hip ABduction 4+/5    Left Hip ADduction 4+/5    Right Knee Flexion 4+/5    Right Knee Extension 4+/5    Left Knee Flexion 4+/5    Left Knee Extension 4+/5    Right Ankle Dorsiflexion 4+/5    Right Ankle Plantar Flexion 4+/5    Left Ankle Dorsiflexion 4+/5      Special Tests   Other special tests Lumbar spine pain with passive SLR bilaterally                      Objective measurements completed on examination: See above findings.       [facility] PT Treatment/Exercise - 07/21/20 0001      Self-Care   Self-Care Other Self-Care Comments    Other Self-Care  Comments  Reviewed initial HEP, anatomy of condition, FOTO, and POC.      Exercises   Exercises Lumbar;Knee/Hip      Lumbar Exercises: Supine   Other Supine Lumbar Exercises Hooklying clams x 10                  PT Education - 07/20/20 2352    Education Details Reviewed initial HEP, anatomy of condition, FOTO, and POC.    Person(s) Educated Patient    Methods Explanation;Demonstration;Tactile cues;Verbal cues;Handout    Comprehension Verbalized understanding;Returned demonstration;Verbal cues required;Tactile cues required;Need further instruction            PT Short Term Goals - 07/21/20 0013      PT SHORT TERM GOAL #1   Title Patient will be independent with initial HEP.    Baseline Patient provided with initial HEP at evaluation 07/21/2020.    Time 3    Period Weeks    Status New    Target Date 08/11/20      PT SHORT TERM GOAL #2   Title Patient will be able to perform lumbar AROM without requiring BUE  for support due to decreased balance.    Baseline Pt holding onto mat during lumbar AROM due to decreased stability.    Time 3    Period Weeks    Status New    Target Date 08/11/20      PT SHORT TERM GOAL #3   Title Patient will demonstrate at least 4+/5 RLE MMT strength grossly.    Baseline 4/5 R hip strength    Time 3    Period Weeks    Status New    Target Date 08/11/20             PT Long Term Goals - 07/21/20 0018      PT LONG TERM GOAL #1   Title Patient will be independent with advanced HEP.    Baseline Patient provided with initial HEP at evaluation 07/21/2020.    Time 6    Period Weeks    Status New    Target Date 09/01/20      PT LONG TERM GOAL #2   Title Patient will be able to perform lumbar AROM with 0/10 pain.    Baseline Patient expresses pain in low back with lumbar AROM and pain in hips with lumbar sidebending to each side    Time 6    Period Weeks    Status New    Target Date 09/01/20      PT LONG TERM GOAL #3   Title  Patient will be able to stand and/or walk for at least 30 minutes to allow for grocery shopping and household activities with </= 2/10 pain.    Baseline Can stand for 10 minutes and requires BUE support when walking in grocery store    Time 6    Period Weeks    Status New    Target Date 09/01/20      PT LONG TERM GOAL #4   Title Patient's FOTO score will improve from 44% to 53% functional ability to demonstrate increased perceived function.    Baseline 44% function; predicted 53% function    Time 6    Period Weeks    Status New    Target Date 09/01/20                  Plan - 07/20/20 1434    Clinical Impression Statement Patient is a 74 year old female who presents to OPPT s/p lumbar spinal fusion (L2-3) 04/19/2020 and potential diagnosis of R trochanteric bursitis per referral with complaints of continued LBP and R hip pain. Pt expresses continued pain and states she feels like she never had the surgery because her symptoms remain the same. She presents with decreased balance and antalgic gait. Patient experienced increased back pain with passive SLR and passive hip FL to 90 deg while in supine. She denies having other falls in addition to her fall this past Sunday, but she reports that her activity is significantly decreased due to being home alone almost all the time with her husband staying busy with his business. Patient should benefit from skilled PT intervention to address deficits and improve tolerance and safety with functional activities.    Personal Factors and Comorbidities Age;Comorbidity 3+;Past/Current Experience;Time since onset of injury/illness/exacerbation    Comorbidities See extensive PMH above    Examination-Activity Limitations Bed Mobility;Locomotion Level    Examination-Participation Restrictions Church;Cleaning;Meal Prep;Community Activity;Shop;Laundry    Stability/Clinical Decision Making Evolving/Moderate complexity    Clinical Decision Making Moderate     Rehab Potential Good    PT  Frequency 2x / week    PT Duration 6 weeks    PT Treatment/Interventions ADLs/Self Care Home Management;Aquatic Therapy;Electrical Stimulation;Cryotherapy;Iontophoresis 20m/ml Dexamethasone;Moist Heat;Traction;Neuromuscular re-education;Balance training;Therapeutic exercise;Therapeutic activities;Functional mobility training;Stair training;Gait training;Patient/family education;Manual techniques;Passive range of motion;Energy conservation;Dry needling;Taping    PT Next Visit Plan Review and update HEP PRN, further assess hip and R trochanteric bursa. Hip/core strength. Manual techniques as indicated    PT Home Exercise Plan [id number] - hooklying clamshell    Consulted and Agree with Plan of Care Patient           Patient will benefit from skilled therapeutic intervention in order to improve the following deficits and impairments:  Difficulty walking,Obesity,Pain,Decreased activity tolerance,Decreased balance,Decreased mobility,Decreased strength,Decreased endurance,Decreased range of motion,Increased muscle spasms  Visit Diagnosis: Pain in right hip - Plan: PT plan of care cert/re-cert  Chronic low back pain, unspecified back pain laterality, unspecified whether sciatica present - Plan: PT plan of care cert/re-cert  Difficulty in walking, not elsewhere classified - Plan: PT plan of care cert/re-cert  Muscle weakness (generalized) - Plan: PT plan of care cert/re-cert     Problem List Patient Active Problem List   Diagnosis Date Noted  . Spinal stenosis of lumbar region with neurogenic claudication 04/19/2020  . Second degree uterine prolapse 05/21/2019  . Morbid obesity with body mass index (BMI) of 40.0 to 44.9 in adult ([facility] 09/06/2017  . Other chest pain 08/23/2017  . Hypertension 10/03/2016  . Prediabetes 10/03/2016  . Diastolic dysfunction 015/87/2761 . Morbid obesity ([place] 08/31/2016  . Other fatigue 08/31/2016  . Shortness of breath on exertion  08/31/2016  . Vitamin D deficiency 08/31/2016  . Vitamin B 12 deficiency 08/31/2016  . B12 deficiency 07/20/2016  . Lumbar radiculitis 04/20/2015  . Spondylolisthesis, acquired 12/29/2014  . Spinal stenosis 08/13/2014  . Pituitary macroadenoma ([place] 08/13/2014  . Hot flashes due to tamoxifen 12/11/2013  . Postmenopausal estrogen deficiency 12/11/2013  . Malignant neoplasm of upper-outer quadrant of left breast in female, estrogen receptor positive ([place] 06/12/2013  . Dyspnea 02/11/2013  . Lactic acidosis 02/11/2013  . Acute bronchitis 02/11/2013  . Class 3 obesity with serious comorbidity and body mass index (BMI) of 40.0 to 44.9 in adult 02/11/2013  . Breast calcifications on mammogram 01/29/2013  . Hyperparathyroidism, primary ([place] 01/29/2013  . OSA (obstructive sleep apnea) 01/30/2011  . Hyperlipidemia 09/20/2007  . Essential hypertension 09/20/2007   PHYSICAL THERAPY DISCHARGE SUMMARY  Visits from Start of Care: 1  Current functional level related to goals / functional outcomes: See above   Remaining deficits: See above   Education / Equipment: See above  Plan: Patient agrees to discharge.  Patient goals were not met. Patient is being discharged due to the patient's request.  ?????      KHaydee Monica PT, DPT 09/29/20 2:56 AM  [place] [address]Phone: [phone]  Fax:  [phone] Name: BHazle OgburnMRN: [id number] of Birth: 207-09-1945

## 2020-08-03 DIAGNOSIS — I10 Essential (primary) hypertension: Secondary | ICD-10-CM | POA: Diagnosis not present

## 2020-08-03 DIAGNOSIS — M48062 Spinal stenosis, lumbar region with neurogenic claudication: Secondary | ICD-10-CM | POA: Diagnosis not present

## 2020-08-05 ENCOUNTER — Other Ambulatory Visit: Payer: Self-pay | Admitting: Family Medicine

## 2020-08-05 ENCOUNTER — Ambulatory Visit: Payer: Medicare Other

## 2020-08-05 DIAGNOSIS — Z0000 Encounter for general adult medical examination without abnormal findings: Secondary | ICD-10-CM

## 2020-08-09 ENCOUNTER — Ambulatory Visit: Payer: Medicare Other

## 2020-08-09 DIAGNOSIS — H04123 Dry eye syndrome of bilateral lacrimal glands: Secondary | ICD-10-CM | POA: Diagnosis not present

## 2020-08-09 DIAGNOSIS — H2000 Unspecified acute and subacute iridocyclitis: Secondary | ICD-10-CM | POA: Diagnosis not present

## 2020-08-11 ENCOUNTER — Ambulatory Visit: Payer: Medicare Other

## 2020-08-18 ENCOUNTER — Ambulatory Visit: Payer: Medicare Other

## 2020-08-31 DIAGNOSIS — H2000 Unspecified acute and subacute iridocyclitis: Secondary | ICD-10-CM | POA: Diagnosis not present

## 2020-08-31 DIAGNOSIS — G4733 Obstructive sleep apnea (adult) (pediatric): Secondary | ICD-10-CM | POA: Diagnosis not present

## 2020-08-31 DIAGNOSIS — H40053 Ocular hypertension, bilateral: Secondary | ICD-10-CM | POA: Diagnosis not present

## 2020-08-31 DIAGNOSIS — H04123 Dry eye syndrome of bilateral lacrimal glands: Secondary | ICD-10-CM | POA: Diagnosis not present

## 2020-09-13 ENCOUNTER — Other Ambulatory Visit: Payer: Self-pay | Admitting: Student

## 2020-09-13 DIAGNOSIS — Z981 Arthrodesis status: Secondary | ICD-10-CM

## 2020-09-15 ENCOUNTER — Ambulatory Visit
Admission: RE | Admit: 2020-09-15 | Discharge: 2020-09-15 | Disposition: A | Discharge status: Home / Self Care | Payer: Medicare Other | Source: Ambulatory Visit | Attending: Family Medicine | Admitting: Family Medicine

## 2020-09-15 ENCOUNTER — Other Ambulatory Visit: Payer: Self-pay

## 2020-09-15 DIAGNOSIS — Z0000 Encounter for general adult medical examination without abnormal findings: Secondary | ICD-10-CM

## 2020-09-15 DIAGNOSIS — Z1231 Encounter for screening mammogram for malignant neoplasm of breast: Secondary | ICD-10-CM | POA: Diagnosis not present

## 2020-09-20 ENCOUNTER — Other Ambulatory Visit: Payer: Self-pay | Admitting: Family Medicine

## 2020-09-20 DIAGNOSIS — R928 Other abnormal and inconclusive findings on diagnostic imaging of breast: Secondary | ICD-10-CM

## 2020-09-23 DIAGNOSIS — H2000 Unspecified acute and subacute iridocyclitis: Secondary | ICD-10-CM | POA: Diagnosis not present

## 2020-09-23 DIAGNOSIS — H40023 Open angle with borderline findings, high risk, bilateral: Secondary | ICD-10-CM | POA: Diagnosis not present

## 2020-09-23 DIAGNOSIS — H353132 Nonexudative age-related macular degeneration, bilateral, intermediate dry stage: Secondary | ICD-10-CM | POA: Diagnosis not present

## 2020-09-23 DIAGNOSIS — H04123 Dry eye syndrome of bilateral lacrimal glands: Secondary | ICD-10-CM | POA: Diagnosis not present

## 2020-10-01 ENCOUNTER — Other Ambulatory Visit: Payer: Self-pay

## 2020-10-01 ENCOUNTER — Ambulatory Visit
Admission: RE | Admit: 2020-10-01 | Discharge: 2020-10-01 | Disposition: A | Discharge status: Home / Self Care | Payer: Medicare Other | Source: Ambulatory Visit | Attending: Family Medicine | Admitting: Family Medicine

## 2020-10-01 ENCOUNTER — Other Ambulatory Visit: Payer: Self-pay | Admitting: Family Medicine

## 2020-10-01 DIAGNOSIS — R928 Other abnormal and inconclusive findings on diagnostic imaging of breast: Secondary | ICD-10-CM

## 2020-10-01 DIAGNOSIS — N6489 Other specified disorders of breast: Secondary | ICD-10-CM | POA: Diagnosis not present

## 2020-10-01 DIAGNOSIS — R922 Inconclusive mammogram: Secondary | ICD-10-CM | POA: Diagnosis not present

## 2020-10-06 DIAGNOSIS — E1142 Type 2 diabetes mellitus with diabetic polyneuropathy: Secondary | ICD-10-CM | POA: Diagnosis not present

## 2020-10-06 DIAGNOSIS — N183 Chronic kidney disease, stage 3 unspecified: Secondary | ICD-10-CM | POA: Diagnosis not present

## 2020-10-06 DIAGNOSIS — N390 Urinary tract infection, site not specified: Secondary | ICD-10-CM | POA: Diagnosis not present

## 2020-10-06 DIAGNOSIS — G8929 Other chronic pain: Secondary | ICD-10-CM | POA: Diagnosis not present

## 2020-10-06 DIAGNOSIS — L299 Pruritus, unspecified: Secondary | ICD-10-CM | POA: Diagnosis not present

## 2020-10-06 DIAGNOSIS — I10 Essential (primary) hypertension: Secondary | ICD-10-CM | POA: Diagnosis not present

## 2020-10-06 DIAGNOSIS — E7800 Pure hypercholesterolemia, unspecified: Secondary | ICD-10-CM | POA: Diagnosis not present

## 2020-10-07 ENCOUNTER — Ambulatory Visit
Admission: RE | Admit: 2020-10-07 | Discharge: 2020-10-07 | Disposition: A | Discharge status: Home / Self Care | Payer: Medicare Other | Source: Ambulatory Visit | Attending: Family Medicine | Admitting: Family Medicine

## 2020-10-07 ENCOUNTER — Other Ambulatory Visit: Payer: Self-pay

## 2020-10-07 DIAGNOSIS — R928 Other abnormal and inconclusive findings on diagnostic imaging of breast: Secondary | ICD-10-CM

## 2020-10-08 ENCOUNTER — Ambulatory Visit
Admission: RE | Admit: 2020-10-08 | Discharge: 2020-10-08 | Disposition: A | Discharge status: Home / Self Care | Payer: Medicare Other | Source: Ambulatory Visit | Attending: Student | Admitting: Student

## 2020-10-08 DIAGNOSIS — M48061 Spinal stenosis, lumbar region without neurogenic claudication: Secondary | ICD-10-CM | POA: Diagnosis not present

## 2020-10-08 DIAGNOSIS — R531 Weakness: Secondary | ICD-10-CM | POA: Diagnosis not present

## 2020-10-08 DIAGNOSIS — Z981 Arthrodesis status: Secondary | ICD-10-CM

## 2020-10-08 DIAGNOSIS — M5126 Other intervertebral disc displacement, lumbar region: Secondary | ICD-10-CM | POA: Diagnosis not present

## 2020-10-08 DIAGNOSIS — Q7649 Other congenital malformations of spine, not associated with scoliosis: Secondary | ICD-10-CM | POA: Diagnosis not present

## 2020-10-08 MED ORDER — GADOBENATE DIMEGLUMINE 529 MG/ML IV SOLN
20.0000 mL | Freq: Once | INTRAVENOUS | Status: AC | PRN
  Administered 2020-10-08: 20 mL via INTRAVENOUS

## 2020-10-11 ENCOUNTER — Other Ambulatory Visit: Payer: Self-pay | Admitting: Surgery

## 2020-10-11 DIAGNOSIS — N6459 Other signs and symptoms in breast: Secondary | ICD-10-CM | POA: Diagnosis not present

## 2020-11-09 DIAGNOSIS — G8929 Other chronic pain: Secondary | ICD-10-CM | POA: Diagnosis not present

## 2020-11-09 DIAGNOSIS — I10 Essential (primary) hypertension: Secondary | ICD-10-CM | POA: Diagnosis not present

## 2020-11-09 DIAGNOSIS — E7800 Pure hypercholesterolemia, unspecified: Secondary | ICD-10-CM | POA: Diagnosis not present

## 2020-11-09 DIAGNOSIS — I129 Hypertensive chronic kidney disease with stage 1 through stage 4 chronic kidney disease, or unspecified chronic kidney disease: Secondary | ICD-10-CM | POA: Diagnosis not present

## 2020-11-09 DIAGNOSIS — E1142 Type 2 diabetes mellitus with diabetic polyneuropathy: Secondary | ICD-10-CM | POA: Diagnosis not present

## 2020-11-09 DIAGNOSIS — M150 Primary generalized (osteo)arthritis: Secondary | ICD-10-CM | POA: Diagnosis not present

## 2020-11-09 DIAGNOSIS — N183 Chronic kidney disease, stage 3 unspecified: Secondary | ICD-10-CM | POA: Diagnosis not present

## 2020-11-09 DIAGNOSIS — D509 Iron deficiency anemia, unspecified: Secondary | ICD-10-CM | POA: Diagnosis not present

## 2020-11-09 DIAGNOSIS — C50919 Malignant neoplasm of unspecified site of unspecified female breast: Secondary | ICD-10-CM | POA: Diagnosis not present

## 2020-11-09 DIAGNOSIS — H35033 Hypertensive retinopathy, bilateral: Secondary | ICD-10-CM | POA: Diagnosis not present

## 2020-11-18 DIAGNOSIS — U071 COVID-19: Secondary | ICD-10-CM | POA: Diagnosis not present

## 2020-11-18 DIAGNOSIS — B349 Viral infection, unspecified: Secondary | ICD-10-CM | POA: Diagnosis not present

## 2020-11-18 DIAGNOSIS — R051 Acute cough: Secondary | ICD-10-CM | POA: Diagnosis not present

## 2020-11-29 DIAGNOSIS — G4733 Obstructive sleep apnea (adult) (pediatric): Secondary | ICD-10-CM | POA: Diagnosis not present

## 2020-11-30 DIAGNOSIS — M545 Low back pain, unspecified: Secondary | ICD-10-CM | POA: Diagnosis not present

## 2020-11-30 DIAGNOSIS — R030 Elevated blood-pressure reading, without diagnosis of hypertension: Secondary | ICD-10-CM | POA: Diagnosis not present

## 2020-11-30 DIAGNOSIS — G8929 Other chronic pain: Secondary | ICD-10-CM | POA: Diagnosis not present

## 2020-11-30 DIAGNOSIS — M48062 Spinal stenosis, lumbar region with neurogenic claudication: Secondary | ICD-10-CM | POA: Diagnosis not present

## 2020-12-19 DIAGNOSIS — S9031XA Contusion of right foot, initial encounter: Secondary | ICD-10-CM | POA: Diagnosis not present

## 2020-12-19 DIAGNOSIS — M79674 Pain in right toe(s): Secondary | ICD-10-CM | POA: Diagnosis not present

## 2020-12-29 ENCOUNTER — Ambulatory Visit: Payer: Medicare Other

## 2020-12-29 ENCOUNTER — Ambulatory Visit (INDEPENDENT_AMBULATORY_CARE_PROVIDER_SITE_OTHER): Payer: Medicare Other

## 2020-12-29 ENCOUNTER — Ambulatory Visit: Payer: Medicare Other | Admitting: Podiatry

## 2020-12-29 ENCOUNTER — Encounter: Payer: Self-pay | Admitting: Podiatry

## 2020-12-29 ENCOUNTER — Other Ambulatory Visit: Payer: Self-pay

## 2020-12-29 DIAGNOSIS — M79675 Pain in left toe(s): Secondary | ICD-10-CM | POA: Diagnosis not present

## 2020-12-29 DIAGNOSIS — M19071 Primary osteoarthritis, right ankle and foot: Secondary | ICD-10-CM

## 2020-12-29 DIAGNOSIS — M2010 Hallux valgus (acquired), unspecified foot: Secondary | ICD-10-CM | POA: Diagnosis not present

## 2020-12-29 DIAGNOSIS — B351 Tinea unguium: Secondary | ICD-10-CM

## 2020-12-29 DIAGNOSIS — M79674 Pain in right toe(s): Secondary | ICD-10-CM | POA: Diagnosis not present

## 2020-12-29 DIAGNOSIS — M2011 Hallux valgus (acquired), right foot: Secondary | ICD-10-CM | POA: Diagnosis not present

## 2021-01-07 DIAGNOSIS — N6459 Other signs and symptoms in breast: Secondary | ICD-10-CM | POA: Diagnosis not present

## 2021-01-09 NOTE — Progress Notes (Signed)
SUBJECTIVE Patient presents to office today complaining of elongated, thickened nails that cause pain while ambulating in shoes.  She is unable to trim her own nails.   Patient also states that she went to the urgent care about a week ago for evaluation of pain to the right foot and she was diagnosed with a bunion to the right hallux.  She states that it feels much better now and she was given a prednisone pack and has been soaking her foot in Epsom salt.  Patient is here for further evaluation and treatment.  Past Medical History:  Diagnosis Date   Anemia    Anxiety    takes Xanax daily   Arthritis    Back pain    Blood transfusion    Breast cancer ([facility]) 2013   left breast   Bronchitis    hx of   Chronic fatigue syndrome    Depression    takes Lexapro daily   Diabetes mellitus without complication ([facility])    boderline   Diverticulosis    Edema    feet and legs   Headache(784.0)    History of colon polyps 1998   adenomatous   Hyperlipidemia    takes Niacin daily   Hypertension    takes Verapamil and Avapro daily   Joint pain    OSA (obstructive sleep apnea)    Personal history of radiation therapy    Pneumonia 4/12   Albuterol daily as needed   Radiation 10/29/11-11/24/11   left breast 6100 cGy   Shortness of breath    with exertion   Spinal stenosis    Tinnitus     OBJECTIVE General Patient is awake, alert, and oriented x 3 and in no acute distress. Derm Skin is dry and supple bilateral. Negative open lesions or macerations. Remaining integument unremarkable. Nails are tender, long, thickened and dystrophic with subungual debris, consistent with onychomycosis, 1-5 bilateral. No signs of infection noted. Vasc  DP and PT pedal pulses palpable bilaterally. Temperature gradient within normal limits.  Neuro Epicritic and protective threshold sensation grossly intact bilaterally.  Musculoskeletal Exam No symptomatic pedal deformities noted bilateral. Muscular strength  within normal limits.  Clinical evidence of a hallux valgus deformity with loadbearing of the foot.  Prominent medial eminence of the first metatarsal noted.  There is some associated tenderness palpation as well Radiographic exam increased intermetatarsal angle greater than 15 degrees with a hallux abductus angle greater than 30 degrees noted right foot consistent with hallux valgus deformity  ASSESSMENT 1.  Pain due to onychomycosis of toenails both 2.  Hallux valgus right  PLAN OF CARE 1. Patient evaluated today.  2. Instructed to maintain good pedal hygiene and foot care.  3. Mechanical debridement of nails 1-5 bilaterally performed using a nail nipper. Filed with dremel without incident.  4.  Today we discussed conservative and surgical management of the patient's symptomatic bunion deformity to the right foot.  At the moment we are going to pursue conservative treatment only.  Continue Epsom salt soaks as needed and wide fitting shoes that do not constrict the toebox area  5.  Return to clinic in 3 mos.    Edrick Kins, DPM [facility]  Dr. Edrick Kins, DPM    [place]  [place], [place] [address]                Office [phone]  Fax [phone]

## 2021-01-26 DIAGNOSIS — E7800 Pure hypercholesterolemia, unspecified: Secondary | ICD-10-CM | POA: Diagnosis not present

## 2021-01-26 DIAGNOSIS — I10 Essential (primary) hypertension: Secondary | ICD-10-CM | POA: Diagnosis not present

## 2021-01-26 DIAGNOSIS — E1142 Type 2 diabetes mellitus with diabetic polyneuropathy: Secondary | ICD-10-CM | POA: Diagnosis not present

## 2021-01-26 DIAGNOSIS — Z0000 Encounter for general adult medical examination without abnormal findings: Secondary | ICD-10-CM | POA: Diagnosis not present

## 2021-01-26 DIAGNOSIS — E559 Vitamin D deficiency, unspecified: Secondary | ICD-10-CM | POA: Diagnosis not present

## 2021-01-26 DIAGNOSIS — R600 Localized edema: Secondary | ICD-10-CM | POA: Diagnosis not present

## 2021-01-26 DIAGNOSIS — N183 Chronic kidney disease, stage 3 unspecified: Secondary | ICD-10-CM | POA: Diagnosis not present

## 2021-01-26 DIAGNOSIS — G4489 Other headache syndrome: Secondary | ICD-10-CM | POA: Diagnosis not present

## 2021-03-08 DIAGNOSIS — G4733 Obstructive sleep apnea (adult) (pediatric): Secondary | ICD-10-CM | POA: Diagnosis not present

## 2021-03-24 DIAGNOSIS — H2000 Unspecified acute and subacute iridocyclitis: Secondary | ICD-10-CM | POA: Diagnosis not present

## 2021-03-24 DIAGNOSIS — H04123 Dry eye syndrome of bilateral lacrimal glands: Secondary | ICD-10-CM | POA: Diagnosis not present

## 2021-03-24 DIAGNOSIS — H40023 Open angle with borderline findings, high risk, bilateral: Secondary | ICD-10-CM | POA: Diagnosis not present

## 2021-03-24 DIAGNOSIS — H353132 Nonexudative age-related macular degeneration, bilateral, intermediate dry stage: Secondary | ICD-10-CM | POA: Diagnosis not present

## 2021-04-22 DIAGNOSIS — G4489 Other headache syndrome: Secondary | ICD-10-CM | POA: Diagnosis not present

## 2021-04-22 DIAGNOSIS — Z23 Encounter for immunization: Secondary | ICD-10-CM | POA: Diagnosis not present

## 2021-04-22 DIAGNOSIS — M79672 Pain in left foot: Secondary | ICD-10-CM | POA: Diagnosis not present

## 2021-05-03 ENCOUNTER — Ambulatory Visit: Payer: Medicare Other | Admitting: Gastroenterology

## 2021-05-03 ENCOUNTER — Ambulatory Visit (INDEPENDENT_AMBULATORY_CARE_PROVIDER_SITE_OTHER): Payer: Medicare Other | Admitting: Gastroenterology

## 2021-05-03 ENCOUNTER — Encounter: Payer: Self-pay | Admitting: Gastroenterology

## 2021-05-03 VITALS — BP 146/84 | HR 64 | Ht 66.0 in | Wt 284.0 lb

## 2021-05-03 DIAGNOSIS — Z86010 Personal history of colonic polyps: Secondary | ICD-10-CM | POA: Diagnosis not present

## 2021-05-03 MED ORDER — SUTAB 1479-225-188 MG PO TABS: ORAL_TABLET | ORAL | 0 refills | Status: DC

## 2021-05-03 NOTE — Patient Instructions (Addendum)
Take Benefiber 1 tablespoon twice a day  You have been scheduled for a colonoscopy at [facility] on 07/26/2021 at 10:30 am. Separate instructions have been given  Take Miralax 1 capful daily 1 week prior to having your colonoscopy   Follow up as needed  Due to recent changes in healthcare laws, you may see the results of your imaging and laboratory studies on MyChart before your provider has had a chance to review them.  We understand that in some cases there may be results that are confusing or concerning to you. Not all laboratory results come back in the same time frame and the provider may be waiting for multiple results in order to interpret others.  Please give [name] 48 hours in order for your provider to thoroughly review all the results before contacting the office for clarification of your results.    If you are age 61 or older, your body mass index should be between 23-30. Your Body mass index is 45.84 kg/m. If this is out of the aforementioned range listed, please consider follow up with your Primary Care Provider.  If you are age 65 or younger, your body mass index should be between 19-25. Your Body mass index is 45.84 kg/m. If this is out of the aformentioned range listed, please consider follow up with your Primary Care Provider.   __________________________________________________________  The  GI providers would like to encourage you to use [facility] to communicate with providers for non-urgent requests or questions.  Due to long hold times on the telephone, sending your provider a message by [facility] may be a faster and more efficient way to get a response.  Please allow 48 business hours for a response.  Please remember that this is for non-urgent requests.    I appreciate the  opportunity to care for you  Thank You   Harl Bowie , MD

## 2021-05-03 NOTE — Progress Notes (Signed)
[name]    [id number]    1945/11/05  Primary Care Physician:Harris, Gwyndolyn Saxon, MD  Referring Physician: Shirline Frees, MD [address] [place],  [place] [address]   Chief complaint: History of colon polyps  HPI:  75 yr old very pleasant African American female  is here to discuss having a surveillance colonoscopy.  She has morbid obesity and severe obstructive sleep apnea.  She is no longer taking Plavix, is on low-dose aspirin She had back surgery in 2016, continues to have significant back pain which is limiting her ability to exercise.  She has gained weight in the past few years. Her last colonoscopy in March 2017 with removal of 15 mm adenomatous polyp.  Prior to that colonoscopy in September 2006 and 2001 were normal. Her first colonoscopy in 1997 showed a tubular adenoma.  Other relevant medical history includes breast cancer and is status post radiation and lumpectomy.   She has no GI symptoms other than occasional constipation. No family history of colon cancer. Denies any nausea, vomiting, abdominal pain, melena or bright red blood per rectum  Colonoscopy 10/27/2015 - Preparation of the colon was fair. - One 15 mm polyp in the mid transverse colon, removed with a cold snare. Resected and retrieved. Clip was placed. - Moderate diverticulosis in the sigmoid colon, in the descending colon, in the transverse colon and in the ascending colon. There was no evidence of diverticular bleeding. - Non-bleeding internal hemorrhoids.   Outpatient Encounter Medications as of 05/03/2021  Medication Sig   acetaminophen (TYLENOL) 500 MG tablet Take 1,000 mg by mouth 2 (two) times daily as needed for headache.   amitriptyline (ELAVIL) 25 MG tablet Take 25 mg by mouth every morning.    aspirin 81 MG tablet Take 81 mg by mouth daily.   atorvastatin (LIPITOR) 20 MG tablet Take 20 mg by mouth daily.    blood glucose meter kit and supplies KIT Dispense based  on patient and insurance preference. Use up to four times daily as directed. (FOR ICD-9 250.00, 250.01).   cyclobenzaprine (FLEXERIL) 10 MG tablet Take 1 tablet (10 mg total) by mouth 3 (three) times daily as needed for muscle spasms.   docusate sodium (COLACE) 100 MG capsule Take 1 capsule (100 mg total) by mouth 2 (two) times daily.   escitalopram (LEXAPRO) 20 MG tablet Take 20 mg by mouth daily.    ezetimibe (ZETIA) 10 MG tablet Take 10 mg by mouth at bedtime.    fluticasone (CUTIVATE) 0.05 % cream Apply 1 application topically daily as needed (itching).    hydrochlorothiazide (HYDRODIURIL) 25 MG tablet Take 1 tablet (25 mg total) by mouth daily.   irbesartan (AVAPRO) 300 MG tablet Take 1 tablet (300 mg total) by mouth at bedtime.   metFORMIN (GLUCOPHAGE) 500 MG tablet Take 1 tablet (500 mg total) by mouth daily with breakfast.   Omega-3 Fatty Acids (FISH OIL) 1200 MG CAPS Take 1,200 mg by mouth 2 (two) times daily.    oxyCODONE (OXY IR/ROXICODONE) 5 MG immediate release tablet Take 1-2 tablets (5-10 mg total) by mouth every 4 (four) hours as needed for moderate pain or severe pain ((score 4 to 6)).   verapamil (VERELAN PM) 240 MG 24 hr capsule Take 240 mg by mouth daily.    vitamin B-12 (CYANOCOBALAMIN) 1000 MCG tablet Take 1,000 mcg by mouth at bedtime.   Vitamin D, Cholecalciferol, 25 MCG (1000 UT) CAPS Take 1,000 Units  by mouth at bedtime.    Vitamin D, Ergocalciferol, (DRISDOL) 1.25 MG (50000 UNIT) CAPS capsule Take 1 capsule (50,000 Units total) by mouth every 7 (seven) days.   No facility-administered encounter medications on file as of 05/03/2021.    Allergies as of 05/03/2021 - Review Complete 12/29/2020  Allergen Reaction Noted   Pregabalin  05/21/2019   Cymbalta [duloxetine hcl] Other (See Comments) 05/27/2012   Duloxetine  12/29/2020   Gabapentin Itching 06/01/2016   Hydrochlorothiazide  12/29/2020   Lipitor [atorvastatin] Other (See Comments) 12/22/2014   Lisinopril Swelling  08/13/2017   Maxzide [hydrochlorothiazide w-triamterene] Other (See Comments) 06/03/2012   Niacin Itching 04/08/2020   Pravastatin sodium Other (See Comments) 06/03/2012   Simvastatin Other (See Comments) 04/12/2015   Triamterene  12/29/2020   Lodine [etodolac] Itching 01/30/2011    Past Medical History:  Diagnosis Date   Anemia    Anxiety    takes Xanax daily   Arthritis    Back pain    Blood transfusion    Breast cancer ([place]) 2013   left breast   Bronchitis    hx of   Chronic fatigue syndrome    Depression    takes Lexapro daily   Diabetes mellitus without complication (HCC)    boderline   Diverticulosis    Edema    feet and legs   Headache(784.0)    History of colon polyps 1998   adenomatous   Hyperlipidemia    takes Niacin daily   Hypertension    takes Verapamil and Avapro daily   Joint pain    OSA (obstructive sleep apnea)    Personal history of radiation therapy    Pneumonia 4/12   Albuterol daily as needed   Radiation 10/29/11-11/24/11   left breast 6100 cGy   Shortness of breath    with exertion   Spinal stenosis    Tinnitus     Past Surgical History:  Procedure Laterality Date   APPENDECTOMY  1978   BACK SURGERY     BELPHAROPTOSIS REPAIR     BREAST EXCISIONAL BIOPSY Right 02/06/2013   BREAST LUMPECTOMY  08/2011   left   BREAST LUMPECTOMY WITH NEEDLE LOCALIZATION Right 02/06/2013   Procedure: RIGHT BREAST LUMPECTOMY WITH NEEDLE LOCALIZATION;  Surgeon: Harl Bowie, MD;  Location: [facility];  Service: General;  Laterality: Right;   COLONOSCOPY W/ POLYPECTOMY     COLONOSCOPY WITH PROPOFOL N/A 10/27/2015   Procedure: COLONOSCOPY WITH PROPOFOL;  Surgeon: Mauri Pole, MD;  Location: [facility];  Service: Endoscopy;  Laterality: N/A;   HARDWARE REMOVAL Left 04/20/2015   Procedure: HARDWARE REMOVAL LEFT LUMBAR FIVE SCREW;  Surgeon: Karie Chimera, MD;  Location: [facility];  Service: Neurosurgery;  Laterality: Left;   JOINT REPLACEMENT Bilateral     bilateral knee   POLYPECTOMY  46's   THYROID CYST EXCISION  1994   TOTAL KNEE ARTHROPLASTY  2006   bilateral   TRANSPHENOIDAL / TRANSNASAL HYPOPHYSECTOMY / RESECTION PITUITARY TUMOR  6/11    Family History  Problem Relation Age of Onset   Heart disease Father    Clotting disorder Father    Stroke Father    Hypertension Father    Heart Problems Father    Stroke Mother    Diabetes Mother    Hypertension Mother    Hyperlipidemia Mother    Obesity Mother    Cancer Sister 75       Breast Cancer   Breast cancer Sister 31   Colon cancer Neg  Hx    Neuropathy Neg Hx     Social History   Socioeconomic History   Marital status: Married    Spouse name: Shanon Brow   Number of children: 2   Years of education: 12   Highest education level: Not on file  Occupational History   Occupation: Retired Designer, fashion/clothing  Tobacco Use   Smoking status: Never   Smokeless tobacco: Never  Vaping Use   Vaping Use: Never used  Substance and Sexual Activity   Alcohol use: No   Drug use: No   Sexual activity: Never    Partners: Male    Birth control/protection: Post-menopausal  Other Topics Concern   Not on file  Social History Narrative   Lives with husband   Caffeine use: 2-3 servings per day   Social Determinants of Health   Financial Resource Strain: Not on file  Food Insecurity: Not on file  Transportation Needs: Not on file  Physical Activity: Not on file  Stress: Not on file  Social Connections: Not on file  Intimate Partner Violence: Not on file      Review of systems: All other review of systems negative except as mentioned in the HPI.   Physical Exam: Vitals:   05/03/21 0921  BP: (!) 146/84  Pulse: 64   Body mass index is 45.84 kg/m. Gen:      No acute distress HEENT:  sclera anicteric Abd:      soft, non-tender; no palpable masses, no distension Ext:    No edema Neuro: alert and oriented x 3 Psych: normal mood and affect  Data Reviewed:  Reviewed labs,  radiology imaging, old records and pertinent past GI work up   Assessment and Plan/Recommendations:  75 year old very pleasant female with morbid obesity BMI > 45, breast cancer status postlumpectomy, chronic back pain, spinal stenosis with history of advanced adenomatous colon polyp here to discuss surveillance colonoscopy She is past due for surveillance, will schedule it daily Given her comorbidities and severe obstructive sleep apnea, plan to do the procedure at the hospital endoscopy unit to make sure we have appropriate equipment and support available if needed The risks and benefits as well as alternatives of endoscopic procedure(s) have been discussed and reviewed. All questions answered. The patient agrees to proceed.  Advised patient to start Benefiber 1 tablespoon 2-3 times daily with meal Increase water intake Use MiraLAX 1 capful daily for a week prior to the procedure to make sure she has adequate bowel prep Will send prescription for Sutab for bowel prep patient preference  The patient was provided an opportunity to ask questions and all were answered. The patient agreed with the plan and demonstrated an understanding of the instructions.  Damaris Hippo , MD    CC: Shirline Frees, MD

## 2021-05-06 ENCOUNTER — Telehealth: Payer: Self-pay | Admitting: Gastroenterology

## 2021-05-06 NOTE — Telephone Encounter (Signed)
Called the patient back to discuss. No answer. I left a message on her voicemail to let her know there are not any earlier dates in the hospital endoscopy unit for Dr Silverio Decamp.  I have asked she call back and tell me if she definitely wants me to cancel her 07/26/21 date. I would then put her on a list to wait until the hospital releases a schedule for January and February of 2023.  I will not cancel her until she confirms this is what she wants to do.

## 2021-05-06 NOTE — Telephone Encounter (Signed)
Inbound call from pt requesting a call back stating that she was trying to see if her appt for her procedure be earlier than the 27th or after due to family coming in for [place]. Please advise. Thank you.

## 2021-05-11 ENCOUNTER — Telehealth: Payer: Self-pay | Admitting: Gastroenterology

## 2021-05-11 NOTE — Telephone Encounter (Signed)
Patient returned call

## 2021-05-11 NOTE — Telephone Encounter (Signed)
Confirmed with the patient that she does want me to cancel the 07/26/21 date. She understands the new procedure date will be in 2023.

## 2021-05-11 NOTE — Telephone Encounter (Signed)
ERROR

## 2021-05-25 ENCOUNTER — Ambulatory Visit: Payer: Medicare Other | Admitting: Podiatry

## 2021-05-25 ENCOUNTER — Other Ambulatory Visit: Payer: Self-pay

## 2021-05-25 DIAGNOSIS — M7752 Other enthesopathy of left foot: Secondary | ICD-10-CM

## 2021-05-25 DIAGNOSIS — M7751 Other enthesopathy of right foot: Secondary | ICD-10-CM

## 2021-05-25 DIAGNOSIS — L989 Disorder of the skin and subcutaneous tissue, unspecified: Secondary | ICD-10-CM

## 2021-05-25 MED ORDER — BETAMETHASONE SOD PHOS & ACET 6 (3-3) MG/ML IJ SUSP
3.0000 mg | Freq: Once | INTRAMUSCULAR | Status: AC
  Administered 2021-05-25: 17:00:00 3 mg via INTRA_ARTICULAR

## 2021-05-25 NOTE — Progress Notes (Signed)
   Subjective:  75 y.o. female presenting today for follow-up evaluation of bilateral ankle pain as well as a symptomatic corn to the left fifth digit.  Patient states that injections in the past have helped her ankle significantly.  She is requesting another ankle injection today.  Patient also states he has been experiencing pain and tenderness associated to the left fifth toe secondary to a symptomatic corn.  She presents for further treatment and evaluation   Past Medical History:  Diagnosis Date   Anemia    Anxiety    takes Xanax daily   Arthritis    Back pain    Blood transfusion    Breast cancer ([place]) 2013   left breast   Bronchitis    hx of   Chronic fatigue syndrome    Depression    takes Lexapro daily   Diabetes mellitus without complication (HCC)    boderline   Diverticulosis    Edema    feet and legs   Headache(784.0)    History of colon polyps 1998   adenomatous   Hyperlipidemia    takes Niacin daily   Hypertension    takes Verapamil and Avapro daily   Joint pain    OSA (obstructive sleep apnea)    Personal history of radiation therapy    Pneumonia 4/12   Albuterol daily as needed   Radiation 10/29/11-11/24/11   left breast 6100 cGy   Shortness of breath    with exertion   Spinal stenosis    Tinnitus      Objective / Physical Exam:  General:  The patient is alert and oriented x3 in no acute distress. Dermatology:  Hyperkeratotic preulcerative callus tissue noted to the left fifth digit Vascular:  Palpable pedal pulses bilaterally. No edema or erythema noted. Capillary refill within normal limits. Neurological:  Epicritic and protective threshold grossly intact bilaterally.  Musculoskeletal Exam:  Pain on palpation to the anterior lateral medial aspects of the patient's bilateral ankles. Mild edema noted. Range of motion within normal limits to all pedal and ankle joints bilateral. Muscle strength 5/5 in all groups bilateral.   Assessment: 1.   DJD/capsulitis bilateral ankles 2.  Pes planus bilateral 3.  Symptomatic corn fifth digit left  Plan of Care:  1. Patient was evaluated. X-Rays reviewed.  2. Injection of 0.5 mL Celestone Soluspan injected in the patient's bilateral ankle joints.  3.  Excisional debridement of the symptomatic corn lesion was performed using a 312 scalpel without incident or bleeding.  The patient felt relief.  Recommend a Band-Aid daily to cushion the area 4.  Return to clinic as needed   Edrick Kins, DPM [facility]  Dr. Edrick Kins, DPM    [address], [place] [address]                Office [phone]  Fax [phone]

## 2021-05-31 DIAGNOSIS — M4316 Spondylolisthesis, lumbar region: Secondary | ICD-10-CM | POA: Diagnosis not present

## 2021-05-31 DIAGNOSIS — M545 Low back pain, unspecified: Secondary | ICD-10-CM | POA: Diagnosis not present

## 2021-05-31 DIAGNOSIS — Z981 Arthrodesis status: Secondary | ICD-10-CM | POA: Diagnosis not present

## 2021-06-13 DIAGNOSIS — G4733 Obstructive sleep apnea (adult) (pediatric): Secondary | ICD-10-CM | POA: Diagnosis not present

## 2021-07-26 ENCOUNTER — Encounter (HOSPITAL_COMMUNITY): Payer: Self-pay

## 2021-07-26 ENCOUNTER — Ambulatory Visit (HOSPITAL_COMMUNITY): Admit: 2021-07-26 | Payer: Medicare Other | Admitting: Gastroenterology

## 2021-07-26 SURGERY — COLONOSCOPY WITH PROPOFOL
Anesthesia: Monitor Anesthesia Care

## 2021-07-29 ENCOUNTER — Other Ambulatory Visit: Payer: Self-pay

## 2021-07-29 DIAGNOSIS — Z86010 Personal history of colonic polyps: Secondary | ICD-10-CM

## 2021-07-29 MED ORDER — SUTAB 1479-225-188 MG PO TABS: ORAL_TABLET | ORAL | 0 refills | Status: DC

## 2021-08-16 DIAGNOSIS — N183 Chronic kidney disease, stage 3 unspecified: Secondary | ICD-10-CM | POA: Diagnosis not present

## 2021-08-16 DIAGNOSIS — E1142 Type 2 diabetes mellitus with diabetic polyneuropathy: Secondary | ICD-10-CM | POA: Diagnosis not present

## 2021-08-16 DIAGNOSIS — E7800 Pure hypercholesterolemia, unspecified: Secondary | ICD-10-CM | POA: Diagnosis not present

## 2021-08-16 DIAGNOSIS — I10 Essential (primary) hypertension: Secondary | ICD-10-CM | POA: Diagnosis not present

## 2021-08-16 DIAGNOSIS — L299 Pruritus, unspecified: Secondary | ICD-10-CM | POA: Diagnosis not present

## 2021-08-16 DIAGNOSIS — R600 Localized edema: Secondary | ICD-10-CM | POA: Diagnosis not present

## 2021-08-22 ENCOUNTER — Other Ambulatory Visit: Payer: Self-pay | Admitting: Family Medicine

## 2021-08-22 DIAGNOSIS — Z1231 Encounter for screening mammogram for malignant neoplasm of breast: Secondary | ICD-10-CM

## 2021-08-29 ENCOUNTER — Ambulatory Visit (INDEPENDENT_AMBULATORY_CARE_PROVIDER_SITE_OTHER): Payer: Medicare Other

## 2021-08-29 ENCOUNTER — Ambulatory Visit: Payer: Medicare Other | Admitting: Podiatry

## 2021-08-29 ENCOUNTER — Other Ambulatory Visit: Payer: Self-pay

## 2021-08-29 ENCOUNTER — Telehealth: Payer: Self-pay | Admitting: Urology

## 2021-08-29 DIAGNOSIS — M2042 Other hammer toe(s) (acquired), left foot: Secondary | ICD-10-CM | POA: Diagnosis not present

## 2021-08-29 DIAGNOSIS — M2041 Other hammer toe(s) (acquired), right foot: Secondary | ICD-10-CM | POA: Diagnosis not present

## 2021-08-29 DIAGNOSIS — M898X9 Other specified disorders of bone, unspecified site: Secondary | ICD-10-CM

## 2021-08-29 DIAGNOSIS — M79675 Pain in left toe(s): Secondary | ICD-10-CM

## 2021-08-29 DIAGNOSIS — B351 Tinea unguium: Secondary | ICD-10-CM

## 2021-08-29 DIAGNOSIS — M79674 Pain in right toe(s): Secondary | ICD-10-CM | POA: Diagnosis not present

## 2021-08-29 NOTE — Progress Notes (Signed)
HPI: 76 y.o. female presenting today for follow-up evaluation regarding bilateral ankle pain as well as a symptomatic corn overlying the PIPJ of the fifth digit left foot.  Last visit on 05/25/2021 the lesion was debrided and she did feel some temporary relief however it does come back and is very painful and symptomatic.  She is very frustrated because she does wear soft fitting shoes that are wide and she still has recurrence of the the corn overlying the fifth digit.  She presents for further treatment and evaluation.  Overall she says her ankles are doing fine  Past Medical History:  Diagnosis Date   Anemia    Anxiety    takes Xanax daily   Arthritis    Back pain    Blood transfusion    Breast cancer ([place]) 2013   left breast   Bronchitis    hx of   Chronic fatigue syndrome    Depression    takes Lexapro daily   Diabetes mellitus without complication (HCC)    boderline   Diverticulosis    Edema    feet and legs   Headache(784.0)    History of colon polyps 1998   adenomatous   Hyperlipidemia    takes Niacin daily   Hypertension    takes Verapamil and Avapro daily   Joint pain    OSA (obstructive sleep apnea)    Personal history of radiation therapy    Pneumonia 4/12   Albuterol daily as needed   Radiation 10/29/11-11/24/11   left breast 6100 cGy   Shortness of breath    with exertion   Spinal stenosis    Tinnitus     Past Surgical History:  Procedure Laterality Date   [place]     BREAST EXCISIONAL BIOPSY Right 02/06/2013   BREAST LUMPECTOMY  08/2011   left   BREAST LUMPECTOMY WITH NEEDLE LOCALIZATION Right 02/06/2013   Procedure: RIGHT BREAST LUMPECTOMY WITH NEEDLE LOCALIZATION;  Surgeon: Harl Bowie, MD;  Location: [facility];  Service: General;  Laterality: Right;   COLONOSCOPY W/ POLYPECTOMY     COLONOSCOPY WITH PROPOFOL N/A 10/27/2015   Procedure: COLONOSCOPY WITH PROPOFOL;  Surgeon: Mauri Pole,  MD;  Location: [facility];  Service: Endoscopy;  Laterality: N/A;   HARDWARE REMOVAL Left 04/20/2015   Procedure: HARDWARE REMOVAL LEFT LUMBAR FIVE SCREW;  Surgeon: Karie Chimera, MD;  Location: [facility];  Service: Neurosurgery;  Laterality: Left;   JOINT REPLACEMENT Bilateral    bilateral knee   POLYPECTOMY  1990's   THYROID CYST EXCISION  1994   TOTAL KNEE ARTHROPLASTY  2006   bilateral   TRANSPHENOIDAL / TRANSNASAL HYPOPHYSECTOMY / RESECTION PITUITARY TUMOR  6/11    Allergies  Allergen Reactions   Pregabalin     Other reaction(s): Dizziness   Cymbalta [Duloxetine Hcl] Other (See Comments)    Stomach pain   Duloxetine    Gabapentin Itching   Hydrochlorothiazide    Lipitor [Atorvastatin] Other (See Comments)    Cramps   Lisinopril Swelling   Maxzide [Hydrochlorothiazide W-Triamterene] Other (See Comments)    Cramping    Niacin Itching   Pravastatin Sodium Other (See Comments)    Headache    Simvastatin Other (See Comments)    Memory loss   Triamterene    Lodine [Etodolac] Itching     Physical Exam: General: The patient is alert and oriented x3 in no acute distress.  Dermatology: There  is a hyperkeratotic corn overlying the PIPJ of the fifth digit left foot.  There is a small corn also to the fifth digit of the right foot.  More symptomatic on the left.  No open wounds  Vascular: Palpable pedal pulses bilaterally. Capillary refill within normal limits.  Negative for any significant edema or erythema  Neurological: Light touch and protective threshold diminished  Musculoskeletal Exam: Hammertoe contracture noted to the fifth digit bilateral.  The most symptomatic toes the fifth digit on the left.  Radiographic Exam:  Normal osseous mineralization.  Degenerative changes noted throughout the foot given the patient's age this would be within normal limits.  No fractures identified.  Hammertoe contracture deformity noted to the fifth digits bilateral  Assessment: 1.   Symptomatic hammertoe fifth digit bilateral w/ overlying corn. LT > RT 2.  Pain due to onychomycosis of toenails bilateral 3.  Diabetes mellitus with peripheral polyneuropathy; controlled.     Plan of Care:  1. Patient evaluated. X-Rays reviewed.  Mechanical debridement of nails 1-5 bilateral was performed using a nail nipper without incident or bleeding 2. Today we discussed the conservative versus surgical management of the presenting pathology.  Unfortunately the patient is tried multiple conservative options to help alleviate the pain from the fifth toe that of been unsuccessful including routine debridements and shoe gear modifications.  The patient opts for surgical management. All possible complications and details of the procedure were explained. All patient questions were answered. No guarantees were expressed or implied.  I explained to the patient that this is a procedure we can perform here in the office without any sedation and local digital block.  The patient agrees and would like to move forward with this.  I do believe the arthroplasty of the head of the proximal phalanx will create lasting relief for the patient.  In regards to the patient's diabetes she says she had blood work just last week and her hemoglobin A1c was 6.0. 3. Authorization for surgery was initiated today. Surgery will consist of PIPJ arthroplasty with excision of the overlying corn fifth digit left foot 4.  Return to clinic morning of an office surgery     Edrick Kins, DPM [facility]  Dr. Edrick Kins, DPM    [address], [place] [address]                Office [phone]  Fax [phone]

## 2021-08-29 NOTE — Telephone Encounter (Signed)
DOS - 09/14/21 OFFICE SX  HAMMERTOE REPAIR 5TH LEFT ---- [id number]  [employer] EFFECTIVE DATE - 07/31/21  PLAN DEDUCTIBLE - $0.00 OUT OF POCKET - $3,600.00  W/ $3,600.00  REMAINING COINSURANCE - 0% COPAY - $295.00   PER [employer] WEBSITE FOR CPT CODE 01100 Notification or Prior Authorization is not required for the requested services  This [facility] Advantage members plan does not currently require a prior authorization for these services. If you have general questions about the prior authorization requirements, please call us at [phone] or visit [email or web address] > Clinician Resources > Advance and Admission Notification Requirements. The number above acknowledges your notification. Please write this number down for future reference. Notification is not a guarantee of coverage or payment.  Decision ID #:[id number]

## 2021-09-07 DIAGNOSIS — L820 Inflamed seborrheic keratosis: Secondary | ICD-10-CM | POA: Diagnosis not present

## 2021-09-07 DIAGNOSIS — L298 Other pruritus: Secondary | ICD-10-CM | POA: Diagnosis not present

## 2021-09-14 ENCOUNTER — Other Ambulatory Visit: Payer: Self-pay

## 2021-09-14 ENCOUNTER — Ambulatory Visit: Payer: Medicare Other | Admitting: Podiatry

## 2021-09-14 DIAGNOSIS — M2042 Other hammer toe(s) (acquired), left foot: Secondary | ICD-10-CM

## 2021-09-14 DIAGNOSIS — G4733 Obstructive sleep apnea (adult) (pediatric): Secondary | ICD-10-CM | POA: Diagnosis not present

## 2021-09-14 NOTE — Progress Notes (Signed)
° °  OPERATIVE REPORT Patient name: [name] MRN: [id number] DOB: 1945-10-14  DOS:  09/14/21  Preop Dx: Symptomatic hammertoe fifth digit left foot Postop Dx: same  Procedure:  1.  PIPJ arthroplasty with derotational skin plasty fifth digit left  Surgeon: Edrick Kins DPM  Anesthesia: Digital block consisting of 2% lidocaine plain with epi totaling 3 mm  Hemostasis: None  EBL: Minimal mL Materials: None Injectables: None Pathology: None  Condition: The patient tolerated the procedure and anesthesia well. No complications noted or reported   Justification for procedure: The patient is a 76 y.o. female who presents today for surgical correction of a symptomatic hammertoe to the fifth digit left foot. All conservative modalities of been unsuccessful in providing any sort of satisfactory alleviation of symptoms with the patient. The patient was told benefits as well as possible side effects of the surgery. The patient consented for surgical correction. The patient consent form was reviewed. All patient questions were answered. No guarantees were expressed or implied.   Procedure in Detail: The patient was brought to the procedure room, placed in the procedure chair in the supine position at which time an aseptic scrub and drape were performed about the patient's respective lower extremity after anesthesia was induced as described above. Attention was then directed to the surgical area where procedure number one commenced.  Procedure #1: PIPJ arthroplasty with derotational skin plasty fifth digit left foot An elliptical type incision was planned and made overlying the PIPJ of the fifth digit of the left foot.  The skin wedge was removed to expose the underlying EDL tendon and joint capsule.  Extensor tenotomy was performed to expose the underlying joint and capsulotomy was performed using a surgical 15 scalpel.  All soft tissue around the hypertrophic head of the proximal phalanx  was reflected away to visualize the phalanx and a bone cutter was utilized at the phalangeal neck of the phalanx to resect away the hypertrophic head.  Irrigation was then utilized and primary closure was obtained using 4-0 nylon suture.  Dry sterile compressive dressings were then applied to all previously mentioned incision sites about the patient's lower extremity. The patient was then discharged from the office with a postsurgical shoe.  No prescription for analgesia was sent to the pharmacy since the patient states that she already has pain medicine at home.. Verbal as well as written instructions were provided for the patient regarding postoperative care. The patient is to keep the dressings clean dry and intact until they are to follow surgeon Dr. Daylene Katayama in the office upon discharge in one week.   Edrick Kins, DPM [facility]  Dr. Edrick Kins, DPM    [address], [place] [address]                Office [phone]  Fax [phone]

## 2021-09-19 ENCOUNTER — Ambulatory Visit: Payer: Medicare Other

## 2021-09-21 ENCOUNTER — Ambulatory Visit (INDEPENDENT_AMBULATORY_CARE_PROVIDER_SITE_OTHER): Payer: Medicare Other

## 2021-09-21 ENCOUNTER — Other Ambulatory Visit: Payer: Self-pay

## 2021-09-21 ENCOUNTER — Ambulatory Visit (INDEPENDENT_AMBULATORY_CARE_PROVIDER_SITE_OTHER): Payer: Medicare Other | Admitting: Podiatry

## 2021-09-21 DIAGNOSIS — M7751 Other enthesopathy of right foot: Secondary | ICD-10-CM

## 2021-09-21 DIAGNOSIS — Z9889 Other specified postprocedural states: Secondary | ICD-10-CM

## 2021-09-21 MED ORDER — BETAMETHASONE SOD PHOS & ACET 6 (3-3) MG/ML IJ SUSP: 3.0000 mg | Freq: Once | INTRAMUSCULAR | Status: DC

## 2021-09-21 NOTE — Progress Notes (Addendum)
° °  Subjective:  Patient presents today status post fifth toe arthroplasty left. DOS: 09/14/2021 performed in office.  Patient states that she is doing well.  She is kept the dressings clean dry and intact for the past week.  Patient also complains of right ankle pain.  She says that slowly the pain is returned.  She has received injections in the past which have helped significantly for several months.  She is requesting an injection today.  Past Medical History:  Diagnosis Date   Anemia    Anxiety    takes Xanax daily   Arthritis    Back pain    Blood transfusion    Breast cancer ([place]) 2013   left breast   Bronchitis    hx of   Chronic fatigue syndrome    Depression    takes Lexapro daily   Diabetes mellitus without complication (HCC)    boderline   Diverticulosis    Edema    feet and legs   Headache(784.0)    History of colon polyps 1998   adenomatous   Hyperlipidemia    takes Niacin daily   Hypertension    takes Verapamil and Avapro daily   Joint pain    OSA (obstructive sleep apnea)    Personal history of radiation therapy    Pneumonia 4/12   Albuterol daily as needed   Radiation 10/29/11-11/24/11   left breast 6100 cGy   Shortness of breath    with exertion   Spinal stenosis    Tinnitus       Objective/Physical Exam Neurovascular status intact.  Skin incisions appear to be well coapted with sutures intact. No sign of infectious process noted. No dehiscence. No active bleeding noted. Moderate edema noted to the surgical extremity.  Radiographic Exam:  Absence of the head of the proximal phalanx noted left fifth toe  Assessment: 1. s/p fifth toe arthroplasty left. DOS: 09/14/2021 2.  Capsulitis right ankle   Plan of Care:  1. Patient was evaluated. X-rays reviewed 2.  Recommend antibiotic ointment and a Band-Aid over the small incision site to the fifth toe daily 3.  Patient may resume regular shoes 4.  Injection of 0.5 cc Celestone Soluspan injected in the  right ankle joint lateral aspect  5.  Return to clinic in 1 week for suture removal   Edrick Kins, DPM [facility]  Dr. Edrick Kins, DPM    [address], [place] [address]                Office [phone]  Fax [phone]

## 2021-09-21 NOTE — Addendum Note (Signed)
Addended by: Edrick Kins on: 09/21/2021 09:03 AM   Modules accepted: Orders

## 2021-09-28 ENCOUNTER — Other Ambulatory Visit: Payer: Self-pay

## 2021-09-28 ENCOUNTER — Encounter (HOSPITAL_COMMUNITY): Payer: Self-pay | Admitting: Gastroenterology

## 2021-09-28 ENCOUNTER — Ambulatory Visit (INDEPENDENT_AMBULATORY_CARE_PROVIDER_SITE_OTHER): Payer: Medicare Other | Admitting: Podiatry

## 2021-09-28 DIAGNOSIS — Z9889 Other specified postprocedural states: Secondary | ICD-10-CM

## 2021-09-28 NOTE — Progress Notes (Signed)
? ?  Subjective:  ?Patient presents today status post fifth toe arthroplasty left. DOS: 09/14/2021 performed in office.  Patient is doing well.  She says that she is wearing good supportive shoes and sneakers.  She also says that her right ankle is doing very well.  No new complaints at this time ?Past Medical History:  ?Diagnosis Date  ? Anemia   ? Anxiety   ? takes Xanax daily  ? Arthritis   ? Back pain   ? Blood transfusion   ? Breast cancer ([place]) 2013  ? left breast  ? Bronchitis   ? hx of  ? Chronic fatigue syndrome   ? Depression   ? takes Lexapro daily  ? Diabetes mellitus without complication ([place])   ? boderline  ? Diverticulosis   ? Edema   ? feet and legs  ? Headache(784.0)   ? History of colon polyps 1998  ? adenomatous  ? Hyperlipidemia   ? takes Niacin daily  ? Hypertension   ? takes Verapamil and Avapro daily  ? Joint pain   ? OSA (obstructive sleep apnea)   ? Personal history of radiation therapy   ? Pneumonia 4/12  ? Albuterol daily as needed  ? Radiation 10/29/11-11/24/11  ? left breast 6100 cGy  ? Shortness of breath   ? with exertion  ? Spinal stenosis   ? Tinnitus   ? ?  ? ?Objective/Physical Exam ?Neurovascular status intact.  Skin incisions appear to be well coapted with sutures intact. No sign of infectious process noted. No dehiscence. No active bleeding noted. Moderate edema noted to the surgical extremity. ?No pain associated to the ankle of the right lower extremity ? ?Assessment: ?1. s/p fifth toe arthroplasty left. DOS: 09/14/2021 ?2.  Capsulitis right ankle; currently asymptomatic ? ? ?Plan of Care:  ?1. Patient was evaluated.  ?2.  Sutures removed today ?3.  Patient may resume good supportive shoes and sneakers ?4.  Full activity no restrictions ?5.  Return to clinic as needed ? ?Edrick Kins, DPM ?[place] ? ?Dr. Edrick Kins, DPM  ?  ?[address]                                    ?[place], [place] [address]                ?Office [phone]  ?Fax [phone] ? ? ? ? ? ?

## 2021-09-28 NOTE — Progress Notes (Signed)
Attempted to obtain medical history via telephone, unable to reach at this time. I left a voicemail to return pre surgical testing department's phone call.  

## 2021-09-29 DIAGNOSIS — H04123 Dry eye syndrome of bilateral lacrimal glands: Secondary | ICD-10-CM | POA: Diagnosis not present

## 2021-09-29 DIAGNOSIS — H353132 Nonexudative age-related macular degeneration, bilateral, intermediate dry stage: Secondary | ICD-10-CM | POA: Diagnosis not present

## 2021-09-29 DIAGNOSIS — E119 Type 2 diabetes mellitus without complications: Secondary | ICD-10-CM | POA: Diagnosis not present

## 2021-09-29 DIAGNOSIS — H40023 Open angle with borderline findings, high risk, bilateral: Secondary | ICD-10-CM | POA: Diagnosis not present

## 2021-10-05 ENCOUNTER — Telehealth: Payer: Self-pay | Admitting: Gastroenterology

## 2021-10-05 NOTE — Telephone Encounter (Signed)
Inbound call from patient. Have procedure at [facility] 3/9. States she have not received any calls to tell her medications she can and cannot take and about her procedure. Would like a call back  ?

## 2021-10-05 NOTE — Telephone Encounter (Signed)
Called [facility]. Patient was previously left voicemail. Endo will call again and review with the patient. ?

## 2021-10-06 ENCOUNTER — Encounter (HOSPITAL_COMMUNITY)
Admission: RE | Disposition: A | Discharge status: Home / Self Care | Payer: Self-pay | Source: Home / Self Care | Attending: Gastroenterology

## 2021-10-06 ENCOUNTER — Ambulatory Visit (HOSPITAL_BASED_OUTPATIENT_CLINIC_OR_DEPARTMENT_OTHER): Payer: Medicare Other | Admitting: Anesthesiology

## 2021-10-06 ENCOUNTER — Ambulatory Visit (HOSPITAL_COMMUNITY): Payer: Medicare Other | Admitting: Anesthesiology

## 2021-10-06 ENCOUNTER — Encounter (HOSPITAL_COMMUNITY): Payer: Self-pay | Admitting: Gastroenterology

## 2021-10-06 ENCOUNTER — Other Ambulatory Visit: Payer: Self-pay

## 2021-10-06 ENCOUNTER — Ambulatory Visit (HOSPITAL_COMMUNITY)
Admission: RE | Admit: 2021-10-06 | Discharge: 2021-10-06 | Disposition: A | Discharge status: Home / Self Care | Payer: Medicare Other | Attending: Gastroenterology | Admitting: Gastroenterology

## 2021-10-06 DIAGNOSIS — K5730 Diverticulosis of large intestine without perforation or abscess without bleeding: Secondary | ICD-10-CM | POA: Diagnosis not present

## 2021-10-06 DIAGNOSIS — G4733 Obstructive sleep apnea (adult) (pediatric): Secondary | ICD-10-CM | POA: Diagnosis not present

## 2021-10-06 DIAGNOSIS — I10 Essential (primary) hypertension: Secondary | ICD-10-CM | POA: Insufficient documentation

## 2021-10-06 DIAGNOSIS — K648 Other hemorrhoids: Secondary | ICD-10-CM | POA: Diagnosis not present

## 2021-10-06 DIAGNOSIS — K635 Polyp of colon: Secondary | ICD-10-CM

## 2021-10-06 DIAGNOSIS — D122 Benign neoplasm of ascending colon: Secondary | ICD-10-CM | POA: Diagnosis not present

## 2021-10-06 DIAGNOSIS — D123 Benign neoplasm of transverse colon: Secondary | ICD-10-CM | POA: Insufficient documentation

## 2021-10-06 DIAGNOSIS — E119 Type 2 diabetes mellitus without complications: Secondary | ICD-10-CM | POA: Diagnosis not present

## 2021-10-06 DIAGNOSIS — Z1211 Encounter for screening for malignant neoplasm of colon: Secondary | ICD-10-CM | POA: Insufficient documentation

## 2021-10-06 DIAGNOSIS — Z86010 Personal history of colonic polyps: Secondary | ICD-10-CM

## 2021-10-06 DIAGNOSIS — Z6841 Body Mass Index (BMI) 40.0 and over, adult: Secondary | ICD-10-CM | POA: Insufficient documentation

## 2021-10-06 DIAGNOSIS — K649 Unspecified hemorrhoids: Secondary | ICD-10-CM | POA: Diagnosis not present

## 2021-10-06 DIAGNOSIS — Z79899 Other long term (current) drug therapy: Secondary | ICD-10-CM | POA: Diagnosis not present

## 2021-10-06 DIAGNOSIS — K644 Residual hemorrhoidal skin tags: Secondary | ICD-10-CM | POA: Insufficient documentation

## 2021-10-06 HISTORY — PX: COLONOSCOPY WITH PROPOFOL: SHX5780

## 2021-10-06 HISTORY — PX: BIOPSY: SHX5522

## 2021-10-06 HISTORY — PX: POLYPECTOMY: SHX5525

## 2021-10-06 LAB — GLUCOSE, CAPILLARY: Glucose-Capillary: 117 mg/dL — ABNORMAL HIGH (ref 70–99)

## 2021-10-06 SURGERY — COLONOSCOPY WITH PROPOFOL
Anesthesia: Monitor Anesthesia Care

## 2021-10-06 MED ORDER — PROPOFOL 500 MG/50ML IV EMUL
INTRAVENOUS | Status: DC | PRN
  Administered 2021-10-06: 90 ug/kg/min via INTRAVENOUS

## 2021-10-06 MED ORDER — LACTATED RINGERS IV SOLN: INTRAVENOUS | Status: DC

## 2021-10-06 MED ORDER — LIDOCAINE HCL (CARDIAC) PF 100 MG/5ML IV SOSY
PREFILLED_SYRINGE | INTRAVENOUS | Status: DC | PRN
  Administered 2021-10-06: 100 mg via INTRAVENOUS

## 2021-10-06 MED ORDER — PROPOFOL 10 MG/ML IV BOLUS
INTRAVENOUS | Status: DC | PRN
  Administered 2021-10-06: 20 mg via INTRAVENOUS
  Administered 2021-10-06: 10 mg via INTRAVENOUS
  Administered 2021-10-06: 20 mg via INTRAVENOUS

## 2021-10-06 SURGICAL SUPPLY — 22 items

## 2021-10-06 NOTE — Transfer of Care (Signed)
Immediate Anesthesia Transfer of Care Note ? ?Patient: [name] ? ?Procedure(s) Performed: COLONOSCOPY WITH PROPOFOL ?POLYPECTOMY ?BIOPSY ? ?Patient Location: Endoscopy Unit ? ?Anesthesia Type:MAC ? ?Level of Consciousness: drowsy ? ?Airway & Oxygen Therapy: Patient Spontanous Breathing and Patient connected to face mask oxygen ? ?Post-op Assessment: Report given to RN and Post -op Vital signs reviewed and stable ? ?Post vital signs: Reviewed and stable ? ?Last Vitals:  ?Vitals Value Taken Time  ?BP 135/67 10/06/21 0943  ?Temp 37 ?C 10/06/21 0943  ?Pulse 73 10/06/21 0945  ?Resp 21 10/06/21 0945  ?SpO2 91 % 10/06/21 0945  ?Vitals shown include unvalidated device data. ? ?Last Pain:  ?Vitals:  ? 10/06/21 0943  ?TempSrc: Tympanic  ?PainSc: Asleep  ?   ? ?  ? ?Complications: No notable events documented. ?

## 2021-10-06 NOTE — Discharge Instructions (Signed)
YOU HAD AN ENDOSCOPIC PROCEDURE TODAY: Refer to the procedure report and other information in the discharge instructions given to you for any specific questions about what was found during the examination. If this information does not answer your questions, please call [place] office at [phone] to clarify.  ° °YOU SHOULD EXPECT: Some feelings of bloating in the abdomen. Passage of more gas than usual. Walking can help get rid of the air that was put into your GI tract during the procedure and reduce the bloating. If you had a lower endoscopy (such as a colonoscopy or flexible sigmoidoscopy) you may notice spotting of blood in your stool or on the toilet paper. Some abdominal soreness may be present for a day or two, also. ° °DIET: Your first meal following the procedure should be a light meal and then it is ok to progress to your normal diet. A half-sandwich or bowl of soup is an example of a good first meal. Heavy or fried foods are harder to digest and may make you feel nauseous or bloated. Drink plenty of fluids but you should avoid alcoholic beverages for 24 hours. If you had a esophageal dilation, please see attached instructions for diet.   ° °ACTIVITY: Your care partner should take you home directly after the procedure. You should plan to take it easy, moving slowly for the rest of the day. You can resume normal activity the day after the procedure however YOU SHOULD NOT DRIVE, use power tools, machinery or perform tasks that involve climbing or major physical exertion for 24 hours (because of the sedation medicines used during the test).  ° °SYMPTOMS TO REPORT IMMEDIATELY: °A gastroenterologist can be reached at any hour. Please call [phone]  for any of the following symptoms:  °Following lower endoscopy (colonoscopy, flexible sigmoidoscopy) °Excessive amounts of blood in the stool  °Significant tenderness, worsening of abdominal pains  °Swelling of the abdomen that is new, acute  °Fever of 100° or  higher  °Following upper endoscopy (EGD, EUS, ERCP, esophageal dilation) °Vomiting of blood or coffee ground material  °New, significant abdominal pain  °New, significant chest pain or pain under the shoulder blades  °Painful or persistently difficult swallowing  °New shortness of breath  °Black, tarry-looking or red, bloody stools ° °FOLLOW UP:  °If any biopsies were taken you will be contacted by phone or by letter within the next 1-3 weeks. Call [phone]  if you have not heard about the biopsies in 3 weeks.  °Please also call with any specific questions about appointments or follow up tests. ° °

## 2021-10-06 NOTE — Op Note (Signed)
[facility] ?Patient Name: [name] ?Procedure Date: 10/06/2021 ?MRN: [id number] ?Attending MD: Mauri Pole , MD ?Date of Birth: [birth year] ?CSN: [id number] ?Age: 76 ?Admit Type: Outpatient ?Procedure:                Colonoscopy ?Indications:              High risk colon cancer surveillance: Personal  ?                          history of colonic polyps, High risk colon cancer  ?                          surveillance: Personal history of adenoma (10 mm or  ?                          greater in size) ?Providers:                Mauri Pole, MD, Ladoris Gene, RN,  ?                          William Dalton, Technician ?Referring MD:              ?Medicines:                Monitored Anesthesia Care ?Complications:            No immediate complications. ?Estimated Blood Loss:     Estimated blood loss was minimal. ?Procedure:                Pre-Anesthesia Assessment: ?                          - Prior to the procedure, a History and Physical  ?                          was performed, and patient medications and  ?                          allergies were reviewed. The patient's tolerance of  ?                          previous anesthesia was also reviewed. The risks  ?                          and benefits of the procedure and the sedation  ?                          options and risks were discussed with the patient.  ?                          All questions were answered, and informed consent  ?                          was obtained. Prior Anticoagulants: The patient has  ?  taken no previous anticoagulant or antiplatelet  ?                          agents. ASA Grade Assessment: III - A patient with  ?                          severe systemic disease. After reviewing the risks  ?                          and benefits, the patient was deemed in  ?                          satisfactory condition to undergo the procedure. ?                          After  obtaining informed consent, the colonoscope  ?                          was passed under direct vision. Throughout the  ?                          procedure, the patient's blood pressure, pulse, and  ?                          oxygen saturations were monitored continuously. The  ?                          PCF-HQ190L ([id number]) Olympus colonoscope was  ?                          introduced through the anus and advanced to the the  ?                          cecum, identified by appendiceal orifice and  ?                          ileocecal valve. The colonoscopy was performed  ?                          without difficulty. The patient tolerated the  ?                          procedure well. The quality of the bowel  ?                          preparation was excellent. The ileocecal valve,  ?                          appendiceal orifice, and rectum were photographed. ?Scope In: 9:27:14 AM ?Scope Out: 9:39:17 AM ?Scope Withdrawal Time: 0 hours 7 minutes 7 seconds  ?Total Procedure Duration: 0 hours 12 minutes 3 seconds  ?Findings: ?     The perianal and digital rectal examinations were normal. ?     A 8 mm polyp was found in the transverse  colon. The polyp was sessile.  ?     The polyp was removed with a cold snare. Resection and retrieval were  ?     complete. ?     A less than 1 mm polyp was found in the ascending colon. The polyp was  ?     sessile. The polyp was removed with a cold biopsy forceps. Resection and  ?     retrieval were complete. ?     Scattered small and large-mouthed diverticula were found in the sigmoid  ?     colon, descending colon, transverse colon and ascending colon. ?     Non-bleeding external and internal hemorrhoids were found during  ?     retroflexion. The hemorrhoids were medium-sized. ?Impression:               - One 8 mm polyp in the transverse colon, removed  ?                          with a cold snare. Resected and retrieved. ?                          - One less than 1 mm polyp in  the ascending colon,  ?                          removed with a cold biopsy forceps. Resected and  ?                          retrieved. ?                          - Moderate diverticulosis in the sigmoid colon, in  ?                          the descending colon, in the transverse colon and  ?                          in the ascending colon. ?                          - Non-bleeding external and internal hemorrhoids. ?Moderate Sedation: ?     N/A ?Recommendation:           - Patient has a contact number available for  ?                          emergencies. The signs and symptoms of potential  ?                          delayed complications were discussed with the  ?                          patient. Return to normal activities tomorrow.  ?                          Written discharge instructions were provided to the  ?  patient. ?                          - Resume previous diet. ?                          - Continue present medications. ?                          - Await pathology results. ?                          - No repeat colonoscopy due to age. ?                          - Return to GI clinic PRN. ?Procedure Code(s):        --- Professional --- ?                          [phone], Colonoscopy, flexible; with removal of  ?                          tumor(s), polyp(s), or other lesion(s) by snare  ?                          technique ?                          45380, 59, Colonoscopy, flexible; with biopsy,  ?                          single or multiple ?Diagnosis Code(s):        --- Professional --- ?                          K63.5, Polyp of colon ?                          K64.8, Other hemorrhoids ?                          Z86.010, Personal history of colonic polyps ?                          K57.30, Diverticulosis of large intestine without  ?                          perforation or abscess without bleeding ?CPT copyright 2019 American Medical Association. All rights reserved. ?The  codes documented in this report are preliminary and upon coder review may  ?be revised to meet current compliance requirements. ?Mauri Pole, MD ?10/06/2021 9:47:12 AM ?This report has been signed electronically. ?Number of Addenda: 0 ?

## 2021-10-06 NOTE — H&P (Signed)
[facility] History and Physical ? ? ?Primary Care Physician:  Shirline Frees, MD ? ? ?Reason for Procedure:  History of adenomatous colon polyps ? ?Plan:    Surveillance colonoscopy with possible interventions as needed ? ? ? ? ?HPI: [name] is a very pleasant 76 y.o. female here for surveillance colonoscopy. ?Denies any nausea, vomiting, abdominal pain, melena or bright red blood per rectum ? ?The risks and benefits as well as alternatives of endoscopic procedure(s) have been discussed and reviewed. All questions answered. The patient agrees to proceed. ? ? ? ?Past Medical History:  ?Diagnosis Date  ? Anemia   ? Anxiety   ? takes Xanax daily  ? Arthritis   ? Back pain   ? Blood transfusion   ? Breast cancer ([place]) 2013  ? left breast  ? Bronchitis   ? hx of  ? Chronic fatigue syndrome   ? Depression   ? takes Lexapro daily  ? Diabetes mellitus without complication ([place])   ? boderline  ? Diverticulosis   ? Edema   ? feet and legs  ? Headache(784.0)   ? History of colon polyps 1998  ? adenomatous  ? Hyperlipidemia   ? takes Niacin daily  ? Hypertension   ? takes Verapamil and Avapro daily  ? Joint pain   ? OSA (obstructive sleep apnea)   ? Personal history of radiation therapy   ? Pneumonia 4/12  ? Albuterol daily as needed  ? Radiation 10/29/11-11/24/11  ? left breast 6100 cGy  ? Shortness of breath   ? with exertion  ? Spinal stenosis   ? Tinnitus   ? ? ?Past Surgical History:  ?Procedure Laterality Date  ? APPENDECTOMY  1978  ? BACK SURGERY    ? [place]    ? BREAST EXCISIONAL BIOPSY Right 02/06/2013  ? BREAST LUMPECTOMY  08/2011  ? left  ? BREAST LUMPECTOMY WITH NEEDLE LOCALIZATION Right 02/06/2013  ? Procedure: RIGHT BREAST LUMPECTOMY WITH NEEDLE LOCALIZATION;  Surgeon: Harl Bowie, MD;  Location: [facility];  Service: General;  Laterality: Right;  ? COLONOSCOPY W/ POLYPECTOMY    ? COLONOSCOPY WITH PROPOFOL N/A 10/27/2015  ? Procedure: COLONOSCOPY WITH PROPOFOL;  Surgeon: Mauri Pole, MD;  Location: [facility];  Service: Endoscopy;  Laterality: N/A;  ? HARDWARE REMOVAL Left 04/20/2015  ? Procedure: HARDWARE REMOVAL LEFT LUMBAR FIVE SCREW;  Surgeon: Karie Chimera, MD;  Location: [facility];  Service: Neurosurgery;  Laterality: Left;  ? JOINT REPLACEMENT Bilateral   ? bilateral knee  ? POLYPECTOMY  1990's  ? THYROID CYST EXCISION  1994  ? TOTAL KNEE ARTHROPLASTY  2006  ? bilateral  ? TRANSPHENOIDAL / TRANSNASAL HYPOPHYSECTOMY / RESECTION PITUITARY TUMOR  6/11  ? ? ?Prior to Admission medications   ?Medication Sig Start Date End Date Taking? Authorizing Provider  ?acetaminophen (TYLENOL) 500 MG tablet Take 1,000 mg by mouth 2 (two) times daily as needed for headache.   Yes [provider]  ?amitriptyline (ELAVIL) 25 MG tablet Take 25 mg by mouth every morning.    Yes [provider]  ?atorvastatin (LIPITOR) 20 MG tablet Take 20 mg by mouth daily.    Yes [provider]  ?cycloSPORINE (RESTASIS) 0.05 % ophthalmic emulsion Place 1 drop into both eyes 2 (two) times daily.   Yes [provider]  ?escitalopram (LEXAPRO) 20 MG tablet Take 20 mg by mouth daily.    Yes [provider]  ?ezetimibe (ZETIA) 10 MG tablet Take 10 mg  by mouth at bedtime.  01/13/19  Yes [provider]  ?fluticasone (CUTIVATE) 0.05 % cream Apply 1 application topically 2 (two) times daily. 11/25/18  Yes [provider]  ?hydrochlorothiazide (HYDRODIURIL) 25 MG tablet Take 1 tablet (25 mg total) by mouth daily. 11/24/19  Yes [name], PA-C  ?hydrOXYzine (ATARAX) 10 MG tablet Take 25 mg by mouth daily. 07/25/21  Yes [provider]  ?irbesartan (AVAPRO) 300 MG tablet Take 1 tablet (300 mg total) by mouth at bedtime. 08/23/17  Yes Lacy Duverney M, PA-C  ?metFORMIN (GLUCOPHAGE) 500 MG tablet Take 1 tablet (500 mg total) by mouth daily with breakfast. 10/30/19  Yes [name], PA-C  ?oxyCODONE (OXY IR/ROXICODONE) 5 MG immediate release tablet Take 1-2  tablets (5-10 mg total) by mouth every 4 (four) hours as needed for moderate pain or severe pain ((score 4 to 6)). 04/22/20  Yes Viona Gilmore D, NP  ?triamcinolone cream (KENALOG) 0.1 % Apply 1 application topically daily as needed for itching. 09/21/21  Yes [provider]  ?verapamil (VERELAN PM) 240 MG 24 hr capsule Take 240 mg by mouth at bedtime.   Yes [provider]  ?blood glucose meter kit and supplies KIT Dispense based on patient and insurance preference. Use up to four times daily as directed. (FOR ICD-9 250.00, 250.01). 06/07/17   Waldon Merl, PA-C  ?Sodium Sulfate-Mag Sulfate-KCl (SUTAB) [phone] MG TABS As directed for colonoscopy 07/29/21   Mauri Pole, MD  ? ? ?No current facility-administered medications for this encounter.  ? ? ?Allergies as of 07/28/2021 - Review Complete 05/03/2021  ?Allergen Reaction Noted  ? Pregabalin  05/21/2019  ? Cymbalta [duloxetine hcl] Other (See Comments) 05/27/2012  ? Duloxetine  12/29/2020  ? Gabapentin Itching 06/01/2016  ? Hydrochlorothiazide  12/29/2020  ? Lipitor [atorvastatin] Other (See Comments) 12/22/2014  ? Lisinopril Swelling 08/13/2017  ? Maxzide [hydrochlorothiazide w-triamterene] Other (See Comments) 06/03/2012  ? Niacin Itching 04/08/2020  ? Pravastatin sodium Other (See Comments) 06/03/2012  ? Simvastatin Other (See Comments) 04/12/2015  ? Triamterene  12/29/2020  ? Lodine [etodolac] Itching 01/30/2011  ? ? ?Family History  ?Problem Relation Age of Onset  ? Heart disease Father   ? Clotting disorder Father   ? Stroke Father   ? Hypertension Father   ? Heart Problems Father   ? Stroke Mother   ? Diabetes Mother   ? Hypertension Mother   ? Hyperlipidemia Mother   ? Obesity Mother   ? Cancer Sister 46  ?     Breast Cancer  ? Breast cancer Sister 72  ? Colon cancer Neg Hx   ? Neuropathy Neg Hx   ? ? ?Social History  ? ?Socioeconomic History  ? Marital status: Married  ?  Spouse name: Shanon Brow  ? Number of children: 2  ? Years  of education: 45  ? Highest education level: Not on file  ?Occupational History  ? Occupation: Retired Designer, fashion/clothing  ?Tobacco Use  ? Smoking status: Never  ? Smokeless tobacco: Never  ?Vaping Use  ? Vaping Use: Never used  ?Substance and Sexual Activity  ? Alcohol use: No  ? Drug use: No  ? Sexual activity: Not Currently  ?  Partners: Male  ?  Birth control/protection: Post-menopausal  ?Other Topics Concern  ? Not on file  ?Social History Narrative  ? Lives with husband  ? Caffeine use: 2-3 servings per day  ? ?Social Determinants of Health  ? ?Financial Resource Strain: Not on file  ?  Food Insecurity: Not on file  ?Transportation Needs: Not on file  ?Physical Activity: Not on file  ?Stress: Not on file  ?Social Connections: Not on file  ?Intimate Partner Violence: Not on file  ? ? ?Review of Systems: ? ?All other review of systems negative except as mentioned in the HPI. ? ?Physical Exam: ?Vital signs in last 24 hours: ?There were no vitals taken for this visit. ?General:   Alert, NAD ?Lungs:  Clear .   ?Heart:  Regular rate and rhythm ?Abdomen:  Soft, nontender and nondistended. ?Neuro/Psych:  Alert and cooperative. Normal mood and affect. A and O x 3 ? ?Reviewed labs, radiology imaging, old records and pertinent past GI work up ? ? ? ? ?K. Denzil Magnuson , MD ?[phone]  ? ? ?  ?

## 2021-10-06 NOTE — Anesthesia Postprocedure Evaluation (Signed)
Anesthesia Post Note ? ?Patient: [name] ? ?Procedure(s) Performed: COLONOSCOPY WITH PROPOFOL ?POLYPECTOMY ?BIOPSY ? ?  ? ?Patient location during evaluation: PACU ?Anesthesia Type: MAC ?Level of consciousness: awake and alert ?Pain management: pain level controlled ?Vital Signs Assessment: post-procedure vital signs reviewed and stable ?Respiratory status: spontaneous breathing, nonlabored ventilation, respiratory function stable and patient connected to nasal cannula oxygen ?Cardiovascular status: stable and blood pressure returned to baseline ?Postop Assessment: no apparent nausea or vomiting ?Anesthetic complications: no ? ? ?No notable events documented. ? ?Last Vitals:  ?Vitals:  ? 10/06/21 0953 10/06/21 1003  ?BP: 133/75 (!) 169/86  ?Pulse: 72 75  ?Resp: 16 (!) 22  ?Temp:    ?SpO2: 97% 96%  ?  ?Last Pain:  ?Vitals:  ? 10/06/21 1003  ?TempSrc:   ?PainSc: 0-No pain  ? ? ?  ?  ?  ?  ?  ?  ? ?Kim Oki S ? ? ? ? ?

## 2021-10-06 NOTE — Anesthesia Preprocedure Evaluation (Signed)
Anesthesia Evaluation  ?Patient identified by MRN, date of birth, ID band ?Patient awake ? ? ? ?Reviewed: ?Allergy & Precautions, NPO status , Patient's Chart, lab work & pertinent test results ? ?Airway ?Mallampati: III ? ?TM Distance: >3 FB ?Neck ROM: Full ? ? ? Dental ?no notable dental hx. ? ?  ?Pulmonary ?shortness of breath and with exertion, sleep apnea ,  ?  ?Pulmonary exam normal ?breath sounds clear to auscultation ? ? ? ? ? ? Cardiovascular ?hypertension, Pt. on medications ?Normal cardiovascular exam ?Rhythm:Regular Rate:Normal ? ? ?  ?Neuro/Psych ?negative neurological ROS ? negative psych ROS  ? GI/Hepatic ?negative GI ROS, Neg liver ROS,   ?Endo/Other  ?diabetesMorbid obesity ? Renal/GU ?negative Renal ROS  ?negative genitourinary ?  ?Musculoskeletal ?negative musculoskeletal ROS ?(+)  ? Abdominal ?  ?Peds ?negative pediatric ROS ?(+)  Hematology ?negative hematology ROS ?(+)   ?Anesthesia Other Findings ? ? Reproductive/Obstetrics ?negative OB ROS ? ?  ? ? ? ? ? ? ? ? ? ? ? ? ? ?  ?  ? ? ? ? ? ? ? ? ?Anesthesia Physical ?Anesthesia Plan ? ?ASA: 3 ? ?Anesthesia Plan: MAC  ? ?Post-op Pain Management: Minimal or no pain anticipated  ? ?Induction: Intravenous ? ?PONV Risk Score and Plan: 2 and Propofol infusion and Treatment may vary due to age or medical condition ? ?Airway Management Planned: Simple Face Mask ? ?Additional Equipment:  ? ?Intra-op Plan:  ? ?Post-operative Plan:  ? ?Informed Consent: I have reviewed the patients History and Physical, chart, labs and discussed the procedure including the risks, benefits and alternatives for the proposed anesthesia with the patient or authorized representative who has indicated his/her understanding and acceptance.  ? ? ? ?Dental advisory given ? ?Plan Discussed with: CRNA and Surgeon ? ?Anesthesia Plan Comments:   ? ? ? ? ? ? ?Anesthesia Quick Evaluation ? ?

## 2021-10-07 LAB — SURGICAL PATHOLOGY

## 2021-10-10 ENCOUNTER — Encounter (HOSPITAL_COMMUNITY): Payer: Self-pay | Admitting: Gastroenterology

## 2021-10-12 ENCOUNTER — Encounter: Payer: Self-pay | Admitting: Gastroenterology

## 2021-10-12 ENCOUNTER — Encounter: Payer: Medicare Other | Admitting: Podiatry

## 2021-10-14 ENCOUNTER — Ambulatory Visit
Admission: RE | Admit: 2021-10-14 | Discharge: 2021-10-14 | Disposition: A | Discharge status: Home / Self Care | Payer: Medicare Other | Source: Ambulatory Visit | Attending: Family Medicine | Admitting: Family Medicine

## 2021-10-14 DIAGNOSIS — Z1231 Encounter for screening mammogram for malignant neoplasm of breast: Secondary | ICD-10-CM | POA: Diagnosis not present

## 2021-11-21 ENCOUNTER — Ambulatory Visit (INDEPENDENT_AMBULATORY_CARE_PROVIDER_SITE_OTHER): Payer: Medicare Other | Admitting: Podiatry

## 2021-11-21 DIAGNOSIS — M79674 Pain in right toe(s): Secondary | ICD-10-CM | POA: Diagnosis not present

## 2021-11-21 DIAGNOSIS — M79675 Pain in left toe(s): Secondary | ICD-10-CM | POA: Diagnosis not present

## 2021-11-21 DIAGNOSIS — B351 Tinea unguium: Secondary | ICD-10-CM

## 2021-11-21 DIAGNOSIS — M79672 Pain in left foot: Secondary | ICD-10-CM

## 2021-11-21 NOTE — Progress Notes (Signed)
? ?  SUBJECTIVE ?Patient presents to office today complaining of elongated, thickened nails that cause pain while ambulating in shoes.  Patient is unable to trim their own nails.  Patient states that about 2 weeks ago she did experience some left heel pain however it is since subsided.  She no longer experiences the pain.  She cannot recall an incident or injury.  Patient is here for further evaluation and treatment. ? ?Past Medical History:  ?Diagnosis Date  ? Anemia   ? Anxiety   ? takes Xanax daily  ? Arthritis   ? Back pain   ? Blood transfusion   ? Breast cancer ([place]) 2013  ? left breast  ? Bronchitis   ? hx of  ? Chronic fatigue syndrome   ? Depression   ? takes Lexapro daily  ? Diabetes mellitus without complication ([place])   ? boderline  ? Diverticulosis   ? Edema   ? feet and legs  ? Headache(784.0)   ? History of colon polyps 1998  ? adenomatous  ? Hyperlipidemia   ? takes Niacin daily  ? Hypertension   ? takes Verapamil and Avapro daily  ? Joint pain   ? OSA (obstructive sleep apnea)   ? Personal history of radiation therapy   ? Pneumonia 4/12  ? Albuterol daily as needed  ? Radiation 10/29/11-11/24/11  ? left breast 6100 cGy  ? Shortness of breath   ? with exertion  ? Spinal stenosis   ? Tinnitus   ? ? ?OBJECTIVE ?General Patient is awake, alert, and oriented x 3 and in no acute distress. ?Derm Skin is dry and supple bilateral. Negative open lesions or macerations. Remaining integument unremarkable. Nails are tender, long, thickened and dystrophic with subungual debris, consistent with onychomycosis, 1-5 bilateral. No signs of infection noted. ?Vasc  DP and PT pedal pulses palpable bilaterally. Temperature gradient within normal limits.  ?Neuro Epicritic and protective threshold sensation grossly intact bilaterally.  ?Musculoskeletal Exam No symptomatic pedal deformities noted bilateral. Muscular strength within normal limits.  No major pain with palpation of the left heel both lateral and medial.  Patient  states there is only some slight tenderness to the lateral aspect of the heel. ? ?ASSESSMENT ?1.  Pain due to onychomycosis of toenails both ?2.  Left heel pain about 2 weeks ago; resolved ? ?PLAN OF CARE ?1. Patient evaluated today.  ?2. Instructed to maintain good pedal hygiene and foot care.  ?3. Mechanical debridement of nails 1-5 bilaterally performed using a nail nipper. Filed with dremel without incident.  ?4.  Recommend good supportive shoes and sneakers that do not irritate the heel  ?5.  Return to clinic in 3 mos.  ? ? ?Edrick Kins, DPM ?[facility] ? ?Dr. Edrick Kins, DPM  ?  ?[address]                                     ?[place], [place] [address]                ?Office [phone]  ?Fax [phone] ? ? ? ? ?

## 2021-11-29 DIAGNOSIS — Z981 Arthrodesis status: Secondary | ICD-10-CM | POA: Diagnosis not present

## 2021-11-29 DIAGNOSIS — G8929 Other chronic pain: Secondary | ICD-10-CM | POA: Diagnosis not present

## 2021-11-29 DIAGNOSIS — M545 Low back pain, unspecified: Secondary | ICD-10-CM | POA: Diagnosis not present

## 2021-12-04 DIAGNOSIS — M79672 Pain in left foot: Secondary | ICD-10-CM | POA: Diagnosis not present

## 2021-12-20 DIAGNOSIS — M48062 Spinal stenosis, lumbar region with neurogenic claudication: Secondary | ICD-10-CM | POA: Diagnosis not present

## 2022-01-03 DIAGNOSIS — R438 Other disturbances of smell and taste: Secondary | ICD-10-CM | POA: Diagnosis not present

## 2022-01-03 DIAGNOSIS — R0789 Other chest pain: Secondary | ICD-10-CM | POA: Diagnosis not present

## 2022-01-12 DIAGNOSIS — M461 Sacroiliitis, not elsewhere classified: Secondary | ICD-10-CM | POA: Diagnosis not present

## 2022-01-12 DIAGNOSIS — M47896 Other spondylosis, lumbar region: Secondary | ICD-10-CM | POA: Diagnosis not present

## 2022-01-12 DIAGNOSIS — M48062 Spinal stenosis, lumbar region with neurogenic claudication: Secondary | ICD-10-CM | POA: Diagnosis not present

## 2022-01-25 DIAGNOSIS — G4733 Obstructive sleep apnea (adult) (pediatric): Secondary | ICD-10-CM | POA: Diagnosis not present

## 2022-01-27 DIAGNOSIS — M47816 Spondylosis without myelopathy or radiculopathy, lumbar region: Secondary | ICD-10-CM | POA: Diagnosis not present

## 2022-02-07 DIAGNOSIS — Z0000 Encounter for general adult medical examination without abnormal findings: Secondary | ICD-10-CM | POA: Diagnosis not present

## 2022-02-07 DIAGNOSIS — E1142 Type 2 diabetes mellitus with diabetic polyneuropathy: Secondary | ICD-10-CM | POA: Diagnosis not present

## 2022-02-07 DIAGNOSIS — D509 Iron deficiency anemia, unspecified: Secondary | ICD-10-CM | POA: Diagnosis not present

## 2022-02-07 DIAGNOSIS — L299 Pruritus, unspecified: Secondary | ICD-10-CM | POA: Diagnosis not present

## 2022-02-07 DIAGNOSIS — I10 Essential (primary) hypertension: Secondary | ICD-10-CM | POA: Diagnosis not present

## 2022-02-07 DIAGNOSIS — E559 Vitamin D deficiency, unspecified: Secondary | ICD-10-CM | POA: Diagnosis not present

## 2022-02-07 DIAGNOSIS — M150 Primary generalized (osteo)arthritis: Secondary | ICD-10-CM | POA: Diagnosis not present

## 2022-02-07 DIAGNOSIS — G8929 Other chronic pain: Secondary | ICD-10-CM | POA: Diagnosis not present

## 2022-02-07 DIAGNOSIS — E7800 Pure hypercholesterolemia, unspecified: Secondary | ICD-10-CM | POA: Diagnosis not present

## 2022-02-07 DIAGNOSIS — N183 Chronic kidney disease, stage 3 unspecified: Secondary | ICD-10-CM | POA: Diagnosis not present

## 2022-02-07 DIAGNOSIS — R438 Other disturbances of smell and taste: Secondary | ICD-10-CM | POA: Diagnosis not present

## 2022-03-08 ENCOUNTER — Encounter (INDEPENDENT_AMBULATORY_CARE_PROVIDER_SITE_OTHER): Payer: Self-pay

## 2022-04-07 DIAGNOSIS — Z23 Encounter for immunization: Secondary | ICD-10-CM | POA: Diagnosis not present

## 2022-04-07 DIAGNOSIS — R1013 Epigastric pain: Secondary | ICD-10-CM | POA: Diagnosis not present

## 2022-04-13 DIAGNOSIS — H903 Sensorineural hearing loss, bilateral: Secondary | ICD-10-CM | POA: Diagnosis not present

## 2022-04-13 DIAGNOSIS — K219 Gastro-esophageal reflux disease without esophagitis: Secondary | ICD-10-CM | POA: Diagnosis not present

## 2022-04-13 DIAGNOSIS — L299 Pruritus, unspecified: Secondary | ICD-10-CM | POA: Diagnosis not present

## 2022-04-13 DIAGNOSIS — Z9889 Other specified postprocedural states: Secondary | ICD-10-CM | POA: Diagnosis not present

## 2022-04-13 DIAGNOSIS — R438 Other disturbances of smell and taste: Secondary | ICD-10-CM | POA: Diagnosis not present

## 2022-04-13 DIAGNOSIS — Z87898 Personal history of other specified conditions: Secondary | ICD-10-CM | POA: Diagnosis not present

## 2022-04-20 DIAGNOSIS — B372 Candidiasis of skin and nail: Secondary | ICD-10-CM | POA: Diagnosis not present

## 2022-04-20 DIAGNOSIS — M549 Dorsalgia, unspecified: Secondary | ICD-10-CM | POA: Diagnosis not present

## 2022-04-29 DIAGNOSIS — G4733 Obstructive sleep apnea (adult) (pediatric): Secondary | ICD-10-CM | POA: Diagnosis not present

## 2022-05-03 DIAGNOSIS — M1990 Unspecified osteoarthritis, unspecified site: Secondary | ICD-10-CM | POA: Diagnosis not present

## 2022-05-03 DIAGNOSIS — M79644 Pain in right finger(s): Secondary | ICD-10-CM | POA: Diagnosis not present

## 2022-05-12 DIAGNOSIS — M546 Pain in thoracic spine: Secondary | ICD-10-CM | POA: Diagnosis not present

## 2022-05-31 DIAGNOSIS — I2510 Atherosclerotic heart disease of native coronary artery without angina pectoris: Secondary | ICD-10-CM

## 2022-05-31 HISTORY — DX: Atherosclerotic heart disease of native coronary artery without angina pectoris: I25.10

## 2022-06-06 DIAGNOSIS — M48062 Spinal stenosis, lumbar region with neurogenic claudication: Secondary | ICD-10-CM | POA: Diagnosis not present

## 2022-06-06 DIAGNOSIS — Z981 Arthrodesis status: Secondary | ICD-10-CM | POA: Diagnosis not present

## 2022-06-08 ENCOUNTER — Encounter (HOSPITAL_BASED_OUTPATIENT_CLINIC_OR_DEPARTMENT_OTHER): Payer: Self-pay | Admitting: Pulmonary Disease

## 2022-06-08 ENCOUNTER — Ambulatory Visit (HOSPITAL_BASED_OUTPATIENT_CLINIC_OR_DEPARTMENT_OTHER): Payer: Medicare Other

## 2022-06-08 ENCOUNTER — Ambulatory Visit (HOSPITAL_BASED_OUTPATIENT_CLINIC_OR_DEPARTMENT_OTHER): Payer: Medicare Other | Admitting: Pulmonary Disease

## 2022-06-08 VITALS — BP 150/78 | HR 58 | Temp 98.3°F | Ht 66.0 in | Wt 292.8 lb

## 2022-06-08 DIAGNOSIS — E669 Obesity, unspecified: Secondary | ICD-10-CM | POA: Diagnosis not present

## 2022-06-08 DIAGNOSIS — R052 Subacute cough: Secondary | ICD-10-CM | POA: Diagnosis not present

## 2022-06-08 DIAGNOSIS — G4733 Obstructive sleep apnea (adult) (pediatric): Secondary | ICD-10-CM | POA: Diagnosis not present

## 2022-06-08 DIAGNOSIS — R059 Cough, unspecified: Secondary | ICD-10-CM | POA: Diagnosis not present

## 2022-06-08 DIAGNOSIS — G4730 Sleep apnea, unspecified: Secondary | ICD-10-CM | POA: Diagnosis not present

## 2022-06-08 NOTE — Progress Notes (Signed)
[facility]  Chief Complaint  Patient presents with   Follow-up    Cpap compliance    Constitutional:  BP (!) 150/78 (BP Location: Left Arm, Cuff Size: Large)   Pulse (!) 58   Temp 98.3 F (36.8 C) (Oral)   Ht _0  (1.676 m)   Wt 292 lb 12.8 oz (132.8 kg)   SpO2 98%   BMI 47.26 kg/m   Past Medical History:  HLD, HTN, HA, Anemia, Spinal stenosis, Breast cancer 2013, Depression, Sleep paralysis related to sleep apnea, DM type 2  Past Surgical History:  Her  has a past surgical history that includes Total knee arthroplasty (2006); Polypectomy (1990's); Appendectomy (1978); Thyroid cyst excision (1994); Transphenoidal / transnasal hypophysectomy / resection pituitary tumor (6/11); Blepharoptosis repair; Joint replacement (Bilateral); Colonoscopy w/ polypectomy; Breast lumpectomy with needle localization (Right, 02/06/2013); Hardware Removal (Left, 04/20/2015); Colonoscopy with propofol (N/A, 10/27/2015); Back surgery; Breast lumpectomy (08/2011); Breast excisional biopsy (Right, 02/06/2013); Colonoscopy with propofol (N/A, 10/06/2021); polypectomy (10/06/2021); and biopsy (10/06/2021).  Brief Summary:  [name] is a 76 y.o. female with obstructive sleep apnea.      Subjective:   I last saw her in December 2021.  Her weight is up 20 lbs since then.  She has been getting cough at night.  This can wake her up from sleep.  Happens with or without CPAP.  She is not having sinus congestion, post nasal drip, sore throat, wheeze, or chest congestion.  She was started on reflux medicine and her cough seems to have improved some, and she isn't getting funny taste in her mouth after each anymore.  She has been getting soreness in her back and around her lower chest.  No history of asthma, and she never smoked cigarettes.  She uses CPAP nightly.  No issues with mask fit.  Pressure setting feels comfortable.    Physical Exam:   Appearance - well  kempt   ENMT - no sinus tenderness, no oral exudate, no LAN, Mallampati 3 airway, no stridor  Respiratory - equal breath sounds bilaterally, no wheezing or rales  CV - s1s2 regular rate and rhythm, no murmurs  Ext - no clubbing, no edema  Skin - no rashes  Psych - normal mood and affect    Sleep Tests:  PSG 08/05/04 >> AHI 139 Auto CPAP 05/08/22 to 06/06/22 >> used on 30 of 30 nights with average 8 hrs 14 min.  Average AHI 9.8 with median CPAP 9 and 95 th percentile CPAP 11 cm H2O  Social History:  She  reports that she has never smoked. She has never used smokeless tobacco. She reports that she does not drink alcohol and does not use drugs.  Family History:  Her family history includes Breast cancer (age of onset: 12) in her sister; Cancer (age of onset: 54) in her sister; Clotting disorder in her father; Diabetes in her mother; Heart Problems in her father; Heart disease in her father; Hyperlipidemia in her mother; Hypertension in her father and mother; Obesity in her mother; Stroke in her father and mother.     Assessment/Plan:   Obstructive sleep apnea. - she is compliant with CPAP and reports benefit from therapy - uses [employer] for her DME - current CPAP ordered August 2020 - continue auto CPAP 5 to 20 cm H2O  Subacute cough. - suspect this is related to reflux - will get chest xray today  Obesity. - discussed how her weight can impact  her health particularly related to sleep apnea and reflux  Hypertension. - blood pressure elevated today - she will follow up with her PCP  Time Spent Involved in Patient Care on Day of Examination:  36 minutes  Follow up:   Patient Instructions  Chest xray today  Follow up in 1 year  Medication List:   Allergies as of 06/08/2022       Reactions   Pregabalin    Dizziness   Cymbalta [duloxetine Hcl] Other (See Comments)   Stomach pain   Gabapentin Itching   Lipitor [atorvastatin] Other (See Comments)   Cramps    Lisinopril Swelling   Maxzide [hydrochlorothiazide W-triamterene] Other (See Comments)   Cramping   Niacin Itching   Pravastatin Sodium Other (See Comments)   Headache   Simvastatin Other (See Comments)   Memory loss   Triamterene Other (See Comments)   Unknown   Lodine [etodolac] Itching        Medication List        Accurate as of June 08, 2022  9:23 AM. If you have any questions, ask your nurse or doctor.          acetaminophen 500 MG tablet Commonly known as: TYLENOL Take 1,000 mg by mouth 2 (two) times daily as needed for headache.   amitriptyline 25 MG tablet Commonly known as: ELAVIL Take 25 mg by mouth every morning.   atorvastatin 20 MG tablet Commonly known as: LIPITOR Take 20 mg by mouth daily.   blood glucose meter kit and supplies Kit Dispense based on patient and insurance preference. Use up to four times daily as directed. (FOR ICD-9 250.00, 250.01).   cycloSPORINE 0.05 % ophthalmic emulsion Commonly known as: RESTASIS Place 1 drop into both eyes 2 (two) times daily.   escitalopram 20 MG tablet Commonly known as: LEXAPRO Take 20 mg by mouth daily.   ezetimibe 10 MG tablet Commonly known as: ZETIA Take 10 mg by mouth at bedtime.   fluticasone 0.05 % cream Commonly known as: CUTIVATE Apply 1 application topically 2 (two) times daily.   hydrochlorothiazide 25 MG tablet Commonly known as: HYDRODIURIL Take 1 tablet (25 mg total) by mouth daily.   hydrOXYzine 10 MG tablet Commonly known as: ATARAX Take 25 mg by mouth daily.   irbesartan 300 MG tablet Commonly known as: AVAPRO Take 1 tablet (300 mg total) by mouth at bedtime.   metFORMIN 500 MG tablet Commonly known as: GLUCOPHAGE Take 1 tablet (500 mg total) by mouth daily with breakfast.   oxyCODONE 5 MG immediate release tablet Commonly known as: Oxy IR/ROXICODONE Take 1-2 tablets (5-10 mg total) by mouth every 4 (four) hours as needed for moderate pain or severe pain ((score 4  to 6)).   triamcinolone cream 0.1 % Commonly known as: KENALOG Apply 1 application topically daily as needed for itching.   verapamil 240 MG 24 hr capsule Commonly known as: VERELAN PM Take 240 mg by mouth at bedtime.        Signature:  Chesley Mires, MD [facility] Pager - [phone] 06/08/2022, 9:23 AM

## 2022-06-08 NOTE — Patient Instructions (Signed)
Chest xray today  Follow up in 1 year 

## 2022-06-12 ENCOUNTER — Telehealth: Payer: Self-pay | Admitting: Pulmonary Disease

## 2022-06-12 DIAGNOSIS — J849 Interstitial pulmonary disease, unspecified: Secondary | ICD-10-CM

## 2022-06-12 NOTE — Telephone Encounter (Signed)
She called back.  Discussed chest xray findings.  She is agreeable to schedule CT chest.

## 2022-06-12 NOTE — Telephone Encounter (Signed)
Patient spoke with Dr. Halford Chessman

## 2022-06-12 NOTE — Telephone Encounter (Signed)
CLINICAL DATA:  Subacute cough   EXAM: CHEST - 2 VIEW   COMPARISON:  Chest x-ray September 21, 2018   FINDINGS: The cardiomediastinal silhouette is unchanged in contour. Mild bilateral reticular pulmonary opacities. No pleural effusion or pneumothorax. The visualized upper abdomen is unremarkable. No acute osseous abnormality.   IMPRESSION: Mild bilateral reticular pulmonary opacities, possibly early pulmonary edema versus atypical infection.     Electronically Signed   By: Beryle Flock M.D.   On: 06/11/2022 14:40    Tried calling patient.  Please let her know her chest xray shows scarring at lung bases and this could be related to reflux and aspiration.  Please arrange for high resolution CT chest to see if she is developing interstitial lung disease from this.

## 2022-06-21 ENCOUNTER — Ambulatory Visit (HOSPITAL_BASED_OUTPATIENT_CLINIC_OR_DEPARTMENT_OTHER)
Admission: RE | Admit: 2022-06-21 | Discharge: 2022-06-21 | Disposition: A | Discharge status: Home / Self Care | Payer: Medicare Other | Source: Ambulatory Visit | Attending: Pulmonary Disease | Admitting: Pulmonary Disease

## 2022-06-21 DIAGNOSIS — J9811 Atelectasis: Secondary | ICD-10-CM | POA: Diagnosis not present

## 2022-06-21 DIAGNOSIS — J849 Interstitial pulmonary disease, unspecified: Secondary | ICD-10-CM | POA: Insufficient documentation

## 2022-06-26 ENCOUNTER — Other Ambulatory Visit: Payer: Self-pay

## 2022-06-26 ENCOUNTER — Telehealth: Payer: Self-pay | Admitting: Pulmonary Disease

## 2022-06-26 DIAGNOSIS — R9389 Abnormal findings on diagnostic imaging of other specified body structures: Secondary | ICD-10-CM

## 2022-06-26 NOTE — Telephone Encounter (Signed)
Pt was made aware of the CT results in result notes. Nothing further needed.

## 2022-07-07 ENCOUNTER — Telehealth (HOSPITAL_BASED_OUTPATIENT_CLINIC_OR_DEPARTMENT_OTHER): Payer: Self-pay | Admitting: *Deleted

## 2022-07-07 NOTE — Telephone Encounter (Signed)
Echocardiogram has been ordered by Dr. Lodema Pilot request for insurance prior authorization sent to office 06/28/22---follow up sent 07/07/22

## 2022-07-10 ENCOUNTER — Ambulatory Visit: Payer: Medicare Other | Admitting: Podiatry

## 2022-07-10 ENCOUNTER — Encounter: Payer: Self-pay | Admitting: Podiatry

## 2022-07-10 ENCOUNTER — Ambulatory Visit (INDEPENDENT_AMBULATORY_CARE_PROVIDER_SITE_OTHER): Payer: Medicare Other

## 2022-07-10 VITALS — BP 142/82 | HR 99

## 2022-07-10 DIAGNOSIS — M7751 Other enthesopathy of right foot: Secondary | ICD-10-CM

## 2022-07-10 DIAGNOSIS — M79674 Pain in right toe(s): Secondary | ICD-10-CM | POA: Diagnosis not present

## 2022-07-10 DIAGNOSIS — B351 Tinea unguium: Secondary | ICD-10-CM

## 2022-07-10 DIAGNOSIS — R600 Localized edema: Secondary | ICD-10-CM | POA: Diagnosis not present

## 2022-07-10 DIAGNOSIS — M79675 Pain in left toe(s): Secondary | ICD-10-CM | POA: Diagnosis not present

## 2022-07-10 MED ORDER — BETAMETHASONE SOD PHOS & ACET 6 (3-3) MG/ML IJ SUSP
3.0000 mg | Freq: Once | INTRAMUSCULAR | Status: AC
  Administered 2022-07-10: 3 mg via INTRA_ARTICULAR

## 2022-07-10 NOTE — Progress Notes (Signed)
   Chief Complaint  Patient presents with   foot care    Patient is here for diabetic foot care.    SUBJECTIVE Patient presents to office today complaining of elongated, thickened nails that cause pain while ambulating in shoes.  Patient also states that she has increased pain and tenderness to the right ankle.  Presenting for further treatment and evaluation  Past Medical History:  Diagnosis Date   Anemia    Anxiety    takes Xanax daily   Arthritis    Back pain    Blood transfusion    Breast cancer ([place]) 2013   left breast   Bronchitis    hx of   Chronic fatigue syndrome    Depression    takes Lexapro daily   Diabetes mellitus without complication ([facility])    boderline   Diverticulosis    Edema    feet and legs   Headache(784.0)    History of colon polyps 1998   adenomatous   Hyperlipidemia    takes Niacin daily   Hypertension    takes Verapamil and Avapro daily   Joint pain    OSA (obstructive sleep apnea)    Personal history of radiation therapy    Pneumonia 4/12   Albuterol daily as needed   Radiation 10/29/11-11/24/11   left breast 6100 cGy   Shortness of breath    with exertion   Spinal stenosis    Tinnitus     OBJECTIVE General Patient is awake, alert, and oriented x 3 and in no acute distress. Derm Skin is dry and supple bilateral. Negative open lesions or macerations. Remaining integument unremarkable. Nails are tender, long, thickened and dystrophic with subungual debris, consistent with onychomycosis, 1-5 bilateral. No signs of infection noted. Vasc  DP and PT pedal pulses palpable bilaterally. Temperature gradient within normal limits.  Neuro Epicritic and protective threshold sensation grossly intact bilaterally.  Musculoskeletal Exam No symptomatic pedal deformities noted bilateral. Muscular strength within normal limits.  No major pain with palpation of the left heel both lateral and medial.  Patient states there is only some slight tenderness to the  lateral aspect of the heel. Radiographic exam RT foot 07/10/2022 advanced degenerative changes noted.  Hallux valgus noted with increased intermetatarsal angle.  No acute fractures noted.  Soft tissue edema noted.  There is also some calcifications to the anterior aspect of the leg and the soft tissue  ASSESSMENT 1.  Pain due to onychomycosis of toenails both 2.  Capsulitis right ankle 3.  Edema right lower extremity  PLAN OF CARE 1. Patient evaluated today.  2. Instructed to maintain good pedal hygiene and foot care.  3. Mechanical debridement of nails 1-5 bilaterally performed using a nail nipper. Filed with dremel without incident.  4.  Injection of 0.5 cc Celestone Soluspan injected into the lateral aspect of the right ankle 5.  Return to clinic 3 months   Edrick Kins, DPM [facility]  Dr. Edrick Kins, DPM    [address], [place] [address]                Office [phone]  Fax [phone]

## 2022-07-11 NOTE — Telephone Encounter (Signed)
Received prior authorization for the Echocardiogram ordered by Dr. Jolyne Loa patient to scheduled and she states she spoke with her PCP and he suggested she see a cardiologist before she schedules the echo---she has an appointment on 07/18/22 at [facility] and she will call after seeing them to let us know if she wished to schedule or not

## 2022-07-18 ENCOUNTER — Encounter: Payer: Self-pay | Admitting: Cardiology

## 2022-07-18 ENCOUNTER — Ambulatory Visit: Payer: Medicare Other | Admitting: Cardiology

## 2022-07-18 VITALS — BP 172/81 | HR 81 | Resp 18 | Ht 66.0 in | Wt 266.8 lb

## 2022-07-18 DIAGNOSIS — E7800 Pure hypercholesterolemia, unspecified: Secondary | ICD-10-CM | POA: Diagnosis not present

## 2022-07-18 DIAGNOSIS — R0609 Other forms of dyspnea: Secondary | ICD-10-CM

## 2022-07-18 DIAGNOSIS — G4733 Obstructive sleep apnea (adult) (pediatric): Secondary | ICD-10-CM

## 2022-07-18 DIAGNOSIS — E119 Type 2 diabetes mellitus without complications: Secondary | ICD-10-CM

## 2022-07-18 DIAGNOSIS — I2510 Atherosclerotic heart disease of native coronary artery without angina pectoris: Secondary | ICD-10-CM

## 2022-07-18 DIAGNOSIS — I2584 Coronary atherosclerosis due to calcified coronary lesion: Secondary | ICD-10-CM | POA: Diagnosis not present

## 2022-07-18 DIAGNOSIS — I10 Essential (primary) hypertension: Secondary | ICD-10-CM | POA: Diagnosis not present

## 2022-07-18 DIAGNOSIS — I700 Atherosclerosis of aorta: Secondary | ICD-10-CM | POA: Diagnosis not present

## 2022-07-18 MED ORDER — ASPIRIN 81 MG PO TBEC: 81.0000 mg | DELAYED_RELEASE_TABLET | Freq: Every day | ORAL | 0 refills | Status: AC

## 2022-07-18 NOTE — Progress Notes (Signed)
ID:  [name], DOB 1946-02-09, MRN [id number]  PCP:  Shirline Frees, MD  Cardiologist:  Rex Kras, DO, [facility] (established care 07/18/2022)  REASON FOR CONSULT: Dyspnea and coronary artery calcification  REQUESTING PHYSICIAN:  Shirline Frees, MD [place],  [place] [id number]  Chief Complaint  Patient presents with   Shortness of Breath   New Patient (Initial Visit)    Coronary artery calcification    HPI  [name] is a 76 y.o. African-American female who presents to the clinic for evaluation of coronary artery calcification and shortness of breath at the request of Shirline Frees, MD. Her past medical history and cardiovascular risk factors include: Coronary artery calcification (nongated CT study 05/2022), aortic atherosclerosis, hypertension, hyperlipidemia, non-insulin-dependent diabetes mellitus type 2, sleep apnea, iron deficiency anemia, obesity due to excess calories  Referred to cardiology for evaluation of shortness of breath in the setting of nongated CT study noting cardiomegaly and coronary artery calcification.  She was having some arthritic pain involving the left shoulder and had gone to the ED for further evaluation and management.  She had a chest x-ray followed by a CT of the chest which noted dilated pulmonary artery, coronary artery calcification, and cardiomegaly.  She was concerned with regards to these findings will refer to cardiology for further evaluation and management.  Clinically she has shortness of breath.  Dominantly with effort related activities (going up the stairs) but this has been chronic and stable.  She has some noncardiac chest pain which originates in the left shoulder and comes anteriorly, not brought on by effort related activities, does not resolve with rest, self-limited.  She denies orthopnea PND or lower extremity swelling.  FUNCTIONAL STATUS: No structured exercise program or daily routine  -limited due to arthritis and neuropathy.  ALLERGIES: Allergies  Allergen Reactions   Pregabalin     Dizziness   Cymbalta [Duloxetine Hcl] Other (See Comments)    Stomach pain   Gabapentin Itching   Lipitor [Atorvastatin] Other (See Comments)    Cramps   Lisinopril Swelling   Maxzide [Hydrochlorothiazide W-Triamterene] Other (See Comments)    Cramping    Niacin Itching   Pravastatin Sodium Other (See Comments)    Headache    Simvastatin Other (See Comments)    Memory loss   Triamterene Other (See Comments)    Unknown   Lodine [Etodolac] Itching    MEDICATION LIST PRIOR TO VISIT: Current Meds  Medication Sig   acetaminophen (TYLENOL) 500 MG tablet Take 1,000 mg by mouth 2 (two) times daily as needed for headache.   amitriptyline (ELAVIL) 25 MG tablet Take 25 mg by mouth every morning.    aspirin EC 81 MG tablet Take 1 tablet (81 mg total) by mouth daily. Swallow whole.   atorvastatin (LIPITOR) 20 MG tablet Take 20 mg by mouth daily.    blood glucose meter kit and supplies KIT Dispense based on patient and insurance preference. Use up to four times daily as directed. (FOR ICD-9 250.00, 250.01).   cycloSPORINE (RESTASIS) 0.05 % ophthalmic emulsion Place 1 drop into both eyes 2 (two) times daily.   escitalopram (LEXAPRO) 20 MG tablet Take 20 mg by mouth daily.    ezetimibe (ZETIA) 10 MG tablet Take 10 mg by mouth at bedtime.    fluticasone (CUTIVATE) 0.05 % cream Apply 1 application topically 2 (two) times daily.   hydrochlorothiazide (HYDRODIURIL) 25 MG tablet Take 1 tablet (25 mg total) by mouth daily.   hydrOXYzine (  ATARAX) 10 MG tablet Take 25 mg by mouth daily.   irbesartan (AVAPRO) 300 MG tablet Take 1 tablet (300 mg total) by mouth at bedtime.   metFORMIN (GLUCOPHAGE) 500 MG tablet Take 1 tablet (500 mg total) by mouth daily with breakfast.   oxyCODONE (OXY IR/ROXICODONE) 5 MG immediate release tablet Take 1-2 tablets (5-10 mg total) by mouth every 4 (four) hours as  needed for moderate pain or severe pain ((score 4 to 6)).   triamcinolone cream (KENALOG) 0.1 % Apply 1 application topically daily as needed for itching.   verapamil (VERELAN PM) 240 MG 24 hr capsule Take 240 mg by mouth at bedtime.   Current Facility-Administered Medications for the 07/18/22 encounter (Office Visit) with Rex Kras, DO  Medication   betamethasone acetate-betamethasone sodium phosphate (CELESTONE) injection 3 mg     PAST MEDICAL HISTORY: Past Medical History:  Diagnosis Date   Anemia    Anxiety    takes Xanax daily   Arthritis    Back pain    Blood transfusion    Breast cancer ([place]) 2013   left breast   Bronchitis    hx of   Chronic fatigue syndrome    Depression    takes Lexapro daily   Diabetes mellitus without complication (HCC)    boderline   Diverticulosis    Edema    feet and legs   Headache(784.0)    History of colon polyps 1998   adenomatous   Hyperlipidemia    takes Niacin daily   Hypertension    takes Verapamil and Avapro daily   Joint pain    OSA (obstructive sleep apnea)    Personal history of radiation therapy    Pneumonia 4/12   Albuterol daily as needed   Radiation 10/29/11-11/24/11   left breast 6100 cGy   Shortness of breath    with exertion   Spinal stenosis    Tinnitus     PAST SURGICAL HISTORY: Past Surgical History:  Procedure Laterality Date   [place]     BIOPSY  10/06/2021   Procedure: BIOPSY;  Surgeon: Mauri Pole, MD;  Location: [facility];  Service: Endoscopy;;   BREAST EXCISIONAL BIOPSY Right 02/06/2013   BREAST LUMPECTOMY  08/2011   left   BREAST LUMPECTOMY WITH NEEDLE LOCALIZATION Right 02/06/2013   Procedure: RIGHT BREAST LUMPECTOMY WITH NEEDLE LOCALIZATION;  Surgeon: Harl Bowie, MD;  Location: [place];  Service: General;  Laterality: Right;   COLONOSCOPY W/ POLYPECTOMY     COLONOSCOPY WITH PROPOFOL N/A 10/27/2015   Procedure: COLONOSCOPY WITH  PROPOFOL;  Surgeon: Mauri Pole, MD;  Location: [facility];  Service: Endoscopy;  Laterality: N/A;   COLONOSCOPY WITH PROPOFOL N/A 10/06/2021   Procedure: COLONOSCOPY WITH PROPOFOL;  Surgeon: Mauri Pole, MD;  Location: [facility];  Service: Endoscopy;  Laterality: N/A;   HARDWARE REMOVAL Left 04/20/2015   Procedure: HARDWARE REMOVAL LEFT LUMBAR FIVE SCREW;  Surgeon: Karie Chimera, MD;  Location: [place];  Service: Neurosurgery;  Laterality: Left;   JOINT REPLACEMENT Bilateral    bilateral knee   POLYPECTOMY  1990's   POLYPECTOMY  10/06/2021   Procedure: POLYPECTOMY;  Surgeon: Mauri Pole, MD;  Location: [facility];  Service: Endoscopy;;   THYROID CYST EXCISION  1994   TOTAL KNEE ARTHROPLASTY  2006   bilateral   TRANSPHENOIDAL / TRANSNASAL HYPOPHYSECTOMY / RESECTION PITUITARY TUMOR  6/11    FAMILY HISTORY: The  patient family history includes Breast cancer (age of onset: 49) in her sister; Cancer (age of onset: 5) in her sister; Clotting disorder in her father; Diabetes in her mother; Heart Problems in her father; Heart disease in her father; Hyperlipidemia in her mother; Hypertension in her father and mother; Obesity in her mother; Stroke in her father and mother.  SOCIAL HISTORY:  The patient  reports that she has never smoked. She has never used smokeless tobacco. She reports that she does not drink alcohol and does not use drugs.  REVIEW OF SYSTEMS: Review of Systems  Cardiovascular:  Positive for chest pain (see HPI) and dyspnea on exertion (chronic). Negative for claudication, irregular heartbeat, leg swelling, near-syncope, orthopnea, palpitations, paroxysmal nocturnal dyspnea and syncope.  Respiratory:  Positive for shortness of breath.   Hematologic/Lymphatic: Negative for bleeding problem.  Musculoskeletal:  Positive for arthritis. Negative for muscle cramps and myalgias.  Neurological:  Negative for dizziness and light-headedness.    PHYSICAL EXAM:     07/18/2022    9:15 AM 07/10/2022    9:31 AM 06/08/2022    8:59 AM  Vitals with BMI  Height _0   _1   Weight 266 lbs 13 oz  292 lbs 13 oz  BMI 78.29  56.21  Systolic 308 657 846  Diastolic 81 82 78  Pulse 81 99 58    Physical Exam  Constitutional: No distress.  Age appropriate, hemodynamically stable.   Neck: No JVD present.  Cardiovascular: Normal rate, regular rhythm, S1 normal, S2 normal, intact distal pulses and normal pulses. Exam reveals no gallop, no S3 and no S4.  No murmur heard. Pulmonary/Chest: Effort normal and breath sounds normal. No stridor. She has no wheezes. She has no rales.  Abdominal: Soft. Bowel sounds are normal. She exhibits no distension. There is no abdominal tenderness.  Musculoskeletal:        General: No edema.     Cervical back: Neck supple.  Neurological: She is alert and oriented to person, place, and time. She has intact cranial nerves (2-12).  Skin: Skin is warm and moist.   RADIOLOGY: CT chest high-resolution 06/21/2022: 1. No evidence of fibrotic interstitial lung disease. 2. Mild air trapping, findings can be seen in the setting of small airways disease. 3. Dilated main pulmonary artery, findings can be seen in the setting of pulmonary arterial hypertension. 4. Hepatic steatosis. 5. Mild coronary artery calcifications of the LAD. 6. Cardiomegaly and aortic Atherosclerosis (ICD10-I70.0).  CARDIAC DATABASE: EKG: 07/18/2022: Normal sinus rhythm, 75 bpm, ST depressions in the lateral leads cannot rule out ischemia.  Echocardiogram: No results found for this or any previous visit from the past 1095 days.    Stress Testing: No results found for this or any previous visit from the past 1095 days.   Heart Catheterization: None  LABORATORY DATA:    Latest Ref Rng & Units 04/20/2020    6:33 AM 04/16/2020    9:22 AM 06/30/2019   12:00 AM  CBC  WBC 4.0 - 10.5 K/uL 10.9  6.5  7.5      Hemoglobin 12.0 - 15.0 g/dL 9.3  11.4  11.6       Hematocrit 36.0 - 46.0 % 29.6  36.3  35      Platelets 150 - 400 K/uL 212  275  272         This result is from an external source.       Latest Ref Rng & Units 04/20/2020    6:33  AM 04/16/2020    9:22 AM 06/30/2019   12:00 AM  CMP  Glucose 70 - 99 mg/dL 122  105    BUN 8 - 23 mg/dL _0 Creatinine 0.44 - 1.00 mg/dL 1.48  1.50  1.2      Sodium 135 - 145 mmol/L 137  140  142      Potassium 3.5 - 5.1 mmol/L 4.3  3.6  3.9      Chloride 98 - 111 mmol/L 104  106  105      CO2 22 - 32 mmol/L _1 Calcium 8.9 - 10.3 mg/dL 9.5  10.3  10.6      AST 13 - 35   12      ALT 7 - 35   13         This result is from an external source.    Lipid Panel     Component Value Date/Time   CHOL 218 (A) 06/30/2019 0000   CHOL 229 (H) 09/12/2018 0916   TRIG 149 06/30/2019 0000   HDL 43 09/12/2018 0916   CHOLHDL 6.4 11/11/2010 0555   VLDL 17 11/11/2010 0555   LDLCALC 161 (H) 09/12/2018 0916   LABVLDL 25 09/12/2018 0916    No components found for: "NTPROBNP" No results for input(s): "PROBNP" in the last 8760 hours. No results for input(s): "TSH" in the last 8760 hours.  BMP No results for input(s): "NA", "K", "CL", "CO2", "GLUCOSE", "BUN", "CREATININE", "CALCIUM", "GFRNONAA", "GFRAA" in the last 8760 hours.  HEMOGLOBIN A1C Lab Results  Component Value Date   HGBA1C 6.3 (H) 04/20/2020   MPG 134.11 04/20/2020   External Labs: Collected: 04/07/2022 provided by referring physician. BUN 17, creatinine 1.35. Sodium 143, potassium 4.3, chloride 108, bicarb 27. AST 15, ALT 14, alkaline phosphatase 84.  Collected: 02/07/2022 TSH 1.77. Total cholesterol 217, triglycerides 137, HDL 41, calculated LDL 152, non-HDL 177. A1c 6.3. Hemoglobin 10.9, hematocrit 33.5%.  IMPRESSION:    ICD-10-CM   1. Dyspnea on exertion  R06.09 EKG 12-Lead    PCV ECHOCARDIOGRAM COMPLETE    PCV MYOCARDIAL PERFUSION WITH LEXISCAN    2. Coronary artery calcification  I25.10 EKG 12-Lead    I25.84 PCV ECHOCARDIOGRAM COMPLETE    PCV MYOCARDIAL PERFUSION WITH LEXISCAN    aspirin EC 81 MG tablet    3. Atherosclerosis of aorta (HCC)  I70.0 PCV ECHOCARDIOGRAM COMPLETE    PCV MYOCARDIAL PERFUSION WITH LEXISCAN    aspirin EC 81 MG tablet    4. Essential hypertension  I10     5. OSA (obstructive sleep apnea)  G47.33     6. Pure hypercholesterolemia  E78.00     7. Non-insulin dependent type 2 diabetes mellitus (HCC)  E11.9 PCV ECHOCARDIOGRAM COMPLETE    PCV MYOCARDIAL PERFUSION WITH LEXISCAN       RECOMMENDATIONS: [name] is a 76 y.o. African-American female whose past medical history and cardiac risk factors include: Coronary artery calcification (nongated CT study 05/2022), aortic atherosclerosis, hypertension, hyperlipidemia, non-insulin-dependent diabetes mellitus type 2, sleep apnea, iron deficiency anemia, obesity due to excess calories.   Dyspnea on exertion Multifactorial. Will proceed with cardiovascular evaluation with an echo and a stress test as discussed below. We emphasized the importance of blood pressure management. I have asked her to follow-up with pulmonary medicine as well.   Coronary artery calcification / atherosclerosis of aorta (HCC) Noted on nongated CT study  in November 2023.  Start aspirin 81 mg p.o. daily Currently on atorvastatin and Zetia. Scheduled to have fasting lipids rechecked in January with PCP-have requested her to bring labs at the next visit Echo will be ordered to evaluate for structural heart disease and left ventricular systolic function. Given the coronary artery calcification, ST-T changes on EKG involving the lateral leads, patient is unable to exercise therefore recommend pharmacological stress to evaluate for reversible ischemia  Essential hypertension Office blood pressure not well-controlled. She did not take her morning medications. Home blood pressures are usually around 130-140 mmHg  OSA (obstructive sleep  apnea) Reemphasized the importance of CPAP compliance. Currently managed by Dr. Halford Chessman of pulmonary medicine.  Pure hypercholesterolemia Currently on atorvastatin and Zetia.   Labs from July 2023 independently reviewed She denies myalgia or other side effects. Patient plans to have it rechecked in January 2024.  Will await results. Recommended goal LDL at least <70 mg/dL Currently managed by primary care provider.  Non-insulin dependent type 2 diabetes mellitus ([place]) Reemphasized the importance of glycemic control. Currently on ARB, statin therapy. Consider Vania Rea -will defer to PCP for now.  Data Reviewed: I have independently reviewed external notes provided by the referring provider as part of this office visit.   I have independently reviewed results of labs, EKG, CT scan as part of medical decision making. I have ordered the following tests:  Orders Placed This Encounter  Procedures   PCV MYOCARDIAL PERFUSION WITH LEXISCAN    Standing Status:   Future    Standing Expiration Date:   07/19/2023   EKG 12-Lead   PCV ECHOCARDIOGRAM COMPLETE    Standing Status:   Future    Standing Expiration Date:   07/19/2023  I have made medications changes at today's encounter as noted above.  FINAL MEDICATION LIST END OF ENCOUNTER: Meds ordered this encounter  Medications   aspirin EC 81 MG tablet    Sig: Take 1 tablet (81 mg total) by mouth daily. Swallow whole.    Dispense:  90 tablet    Refill:  0    There are no discontinued medications.   Current Outpatient Medications:    acetaminophen (TYLENOL) 500 MG tablet, Take 1,000 mg by mouth 2 (two) times daily as needed for headache., Disp: , Rfl:    amitriptyline (ELAVIL) 25 MG tablet, Take 25 mg by mouth every morning. , Disp: , Rfl:    aspirin EC 81 MG tablet, Take 1 tablet (81 mg total) by mouth daily. Swallow whole., Disp: 90 tablet, Rfl: 0   atorvastatin (LIPITOR) 20 MG tablet, Take 20 mg by mouth daily. , Disp: , Rfl:    blood  glucose meter kit and supplies KIT, Dispense based on patient and insurance preference. Use up to four times daily as directed. (FOR ICD-9 250.00, 250.01)., Disp: 1 each, Rfl: 0   cycloSPORINE (RESTASIS) 0.05 % ophthalmic emulsion, Place 1 drop into both eyes 2 (two) times daily., Disp: , Rfl:    escitalopram (LEXAPRO) 20 MG tablet, Take 20 mg by mouth daily. , Disp: , Rfl:    ezetimibe (ZETIA) 10 MG tablet, Take 10 mg by mouth at bedtime. , Disp: , Rfl:    fluticasone (CUTIVATE) 0.05 % cream, Apply 1 application topically 2 (two) times daily., Disp: , Rfl:    hydrochlorothiazide (HYDRODIURIL) 25 MG tablet, Take 1 tablet (25 mg total) by mouth daily., Disp: 30 tablet, Rfl: 0   hydrOXYzine (ATARAX) 10 MG tablet, Take 25 mg by mouth daily.,  Disp: , Rfl:    irbesartan (AVAPRO) 300 MG tablet, Take 1 tablet (300 mg total) by mouth at bedtime., Disp: 30 tablet, Rfl: 0   metFORMIN (GLUCOPHAGE) 500 MG tablet, Take 1 tablet (500 mg total) by mouth daily with breakfast., Disp: 90 tablet, Rfl: 0   oxyCODONE (OXY IR/ROXICODONE) 5 MG immediate release tablet, Take 1-2 tablets (5-10 mg total) by mouth every 4 (four) hours as needed for moderate pain or severe pain ((score 4 to 6))., Disp: 40 tablet, Rfl: 0   triamcinolone cream (KENALOG) 0.1 %, Apply 1 application topically daily as needed for itching., Disp: , Rfl:    verapamil (VERELAN PM) 240 MG 24 hr capsule, Take 240 mg by mouth at bedtime., Disp: , Rfl:   Current Facility-Administered Medications:    betamethasone acetate-betamethasone sodium phosphate (CELESTONE) injection 3 mg, 3 mg, Intra-articular, Once, Evans, Dorathy Daft, DPM  Orders Placed This Encounter  Procedures   PCV MYOCARDIAL PERFUSION WITH LEXISCAN   EKG 12-Lead   PCV ECHOCARDIOGRAM COMPLETE    There are no Patient Instructions on file for this visit.   --Continue cardiac medications as reconciled in final medication list. --Return in about 6 weeks (around 08/29/2022) for Follow up,  Dyspnea, Review test results. or sooner if needed. --Continue follow-up with your primary care physician regarding the management of your other chronic comorbid conditions.  Patient's questions and concerns were addressed to her satisfaction. She voices understanding of the instructions provided during this encounter.   This note was created using a voice recognition software as a result there may be grammatical errors inadvertently enclosed that do not reflect the nature of this encounter. Every attempt is made to correct such errors.  Rex Kras, [place], [facility]  Pager: [phone] Office: [phone]

## 2022-07-27 ENCOUNTER — Ambulatory Visit: Payer: Medicare Other

## 2022-07-27 DIAGNOSIS — I700 Atherosclerosis of aorta: Secondary | ICD-10-CM

## 2022-07-27 DIAGNOSIS — I2510 Atherosclerotic heart disease of native coronary artery without angina pectoris: Secondary | ICD-10-CM

## 2022-07-27 DIAGNOSIS — R0609 Other forms of dyspnea: Secondary | ICD-10-CM | POA: Diagnosis not present

## 2022-07-27 DIAGNOSIS — E119 Type 2 diabetes mellitus without complications: Secondary | ICD-10-CM | POA: Diagnosis not present

## 2022-07-27 DIAGNOSIS — I2584 Coronary atherosclerosis due to calcified coronary lesion: Secondary | ICD-10-CM | POA: Diagnosis not present

## 2022-07-28 ENCOUNTER — Telehealth: Payer: Self-pay

## 2022-07-28 DIAGNOSIS — G4733 Obstructive sleep apnea (adult) (pediatric): Secondary | ICD-10-CM | POA: Diagnosis not present

## 2022-07-28 NOTE — Telephone Encounter (Signed)
Gave results to patient and told her that we will forward a copy of her Echo to her PCP. Patient verbalized understanding.

## 2022-07-28 NOTE — Telephone Encounter (Signed)
-----   Message from [place], [place] sent at 07/28/2022  2:33 PM EST ----- Left ventricular ejection fraction (pumping activity) of the heart is within the normal limit.   Incidentally noted gallstones on the subcostal images.  Would recommend considering a dedicated ultrasound of the abdomen with PCP.  Will defer further management to PCP at this time.  Please send them a copy for reference  Details will be reviewed at the next office visit.   Sunit Butterfield, DO, [facility]

## 2022-08-04 DIAGNOSIS — G8929 Other chronic pain: Secondary | ICD-10-CM | POA: Diagnosis not present

## 2022-08-04 DIAGNOSIS — D509 Iron deficiency anemia, unspecified: Secondary | ICD-10-CM | POA: Diagnosis not present

## 2022-08-04 DIAGNOSIS — I10 Essential (primary) hypertension: Secondary | ICD-10-CM | POA: Diagnosis not present

## 2022-08-04 DIAGNOSIS — R9389 Abnormal findings on diagnostic imaging of other specified body structures: Secondary | ICD-10-CM | POA: Diagnosis not present

## 2022-08-04 DIAGNOSIS — E7800 Pure hypercholesterolemia, unspecified: Secondary | ICD-10-CM | POA: Diagnosis not present

## 2022-08-04 DIAGNOSIS — E1142 Type 2 diabetes mellitus with diabetic polyneuropathy: Secondary | ICD-10-CM | POA: Diagnosis not present

## 2022-08-04 DIAGNOSIS — N183 Chronic kidney disease, stage 3 unspecified: Secondary | ICD-10-CM | POA: Diagnosis not present

## 2022-08-04 DIAGNOSIS — M20009 Unspecified deformity of unspecified finger(s): Secondary | ICD-10-CM | POA: Diagnosis not present

## 2022-08-04 DIAGNOSIS — M150 Primary generalized (osteo)arthritis: Secondary | ICD-10-CM | POA: Diagnosis not present

## 2022-08-07 ENCOUNTER — Other Ambulatory Visit: Payer: Self-pay | Admitting: Family Medicine

## 2022-08-07 DIAGNOSIS — R9389 Abnormal findings on diagnostic imaging of other specified body structures: Secondary | ICD-10-CM

## 2022-08-07 DIAGNOSIS — B3731 Acute candidiasis of vulva and vagina: Secondary | ICD-10-CM | POA: Diagnosis not present

## 2022-08-08 ENCOUNTER — Other Ambulatory Visit: Payer: Medicare Other

## 2022-08-11 DIAGNOSIS — M79671 Pain in right foot: Secondary | ICD-10-CM | POA: Diagnosis not present

## 2022-08-11 DIAGNOSIS — M79674 Pain in right toe(s): Secondary | ICD-10-CM | POA: Diagnosis not present

## 2022-08-14 ENCOUNTER — Ambulatory Visit: Payer: Medicare Other

## 2022-08-14 DIAGNOSIS — E119 Type 2 diabetes mellitus without complications: Secondary | ICD-10-CM

## 2022-08-14 DIAGNOSIS — I700 Atherosclerosis of aorta: Secondary | ICD-10-CM | POA: Diagnosis not present

## 2022-08-14 DIAGNOSIS — R0609 Other forms of dyspnea: Secondary | ICD-10-CM | POA: Diagnosis not present

## 2022-08-14 DIAGNOSIS — I2510 Atherosclerotic heart disease of native coronary artery without angina pectoris: Secondary | ICD-10-CM | POA: Diagnosis not present

## 2022-08-14 DIAGNOSIS — I2584 Coronary atherosclerosis due to calcified coronary lesion: Secondary | ICD-10-CM | POA: Diagnosis not present

## 2022-08-14 NOTE — Progress Notes (Signed)
LVM for patient to call back.

## 2022-08-15 DIAGNOSIS — M79671 Pain in right foot: Secondary | ICD-10-CM | POA: Diagnosis not present

## 2022-08-15 NOTE — Progress Notes (Signed)
Patient is aware to bring all medications to appointment, call transferred to [address] [facility]) to reschedule that appointment.

## 2022-08-16 NOTE — Progress Notes (Signed)
Called and spoke with patient regarding her stress test results. Patient has an appointment tomorrow and said that she will be here.

## 2022-08-17 ENCOUNTER — Ambulatory Visit: Payer: Medicare Other | Admitting: Cardiology

## 2022-08-17 ENCOUNTER — Encounter: Payer: Self-pay | Admitting: Cardiology

## 2022-08-17 VITALS — BP 130/74 | HR 59 | Resp 18 | Ht 66.0 in | Wt 273.8 lb

## 2022-08-17 DIAGNOSIS — I2510 Atherosclerotic heart disease of native coronary artery without angina pectoris: Secondary | ICD-10-CM | POA: Diagnosis not present

## 2022-08-17 DIAGNOSIS — I700 Atherosclerosis of aorta: Secondary | ICD-10-CM | POA: Diagnosis not present

## 2022-08-17 DIAGNOSIS — R9439 Abnormal result of other cardiovascular function study: Secondary | ICD-10-CM | POA: Diagnosis not present

## 2022-08-17 DIAGNOSIS — R072 Precordial pain: Secondary | ICD-10-CM

## 2022-08-17 DIAGNOSIS — E119 Type 2 diabetes mellitus without complications: Secondary | ICD-10-CM

## 2022-08-17 DIAGNOSIS — R0609 Other forms of dyspnea: Secondary | ICD-10-CM | POA: Diagnosis not present

## 2022-08-17 DIAGNOSIS — I10 Essential (primary) hypertension: Secondary | ICD-10-CM | POA: Diagnosis not present

## 2022-08-17 DIAGNOSIS — G4733 Obstructive sleep apnea (adult) (pediatric): Secondary | ICD-10-CM | POA: Diagnosis not present

## 2022-08-17 DIAGNOSIS — I2584 Coronary atherosclerosis due to calcified coronary lesion: Secondary | ICD-10-CM | POA: Diagnosis not present

## 2022-08-17 DIAGNOSIS — E7800 Pure hypercholesterolemia, unspecified: Secondary | ICD-10-CM

## 2022-08-17 MED ORDER — ATORVASTATIN CALCIUM 20 MG PO TABS: 20.0000 mg | ORAL_TABLET | Freq: Every day | ORAL | 0 refills | Status: DC

## 2022-08-17 MED ORDER — METOPROLOL TARTRATE 25 MG PO TABS: 25.0000 mg | ORAL_TABLET | Freq: Two times a day (BID) | ORAL | 0 refills | Status: DC

## 2022-08-17 NOTE — Progress Notes (Signed)
ID:  [name], DOB 08-Nov-1945, MRN [id number]  PCP:  Shirline Frees, MD  Cardiologist:  Rex Kras, DO, [facility] (established care 07/18/2022)  Date: 08/17/22 Last Office Visit: 07/18/2022  Chief Complaint  Patient presents with   Shortness of Breath   Follow-up    6 weeks    HPI  [name] is a 77 y.o. African-American female whose past medical history and cardiovascular risk factors include: Coronary artery calcification (nongated CT study 05/2022), aortic atherosclerosis, hypertension, hyperlipidemia, non-insulin-dependent diabetes mellitus type 2, sleep apnea, iron deficiency anemia, obesity due to excess calories  Patient is being followed by the practice for coronary artery calcification, cardiomegaly, and dilated pulmonary artery noted on nongated CT study during one of her ER visits.  Given multiple cardiovascular risk factors, coronary calcification, exertional dyspnea, precordial pain at the last office visit the decision was to proceed with echo and stress test.  Echocardiogram noted preserved LVEF with grade 1 diastolic impairment and no significant valvular heart disease.  RVSP not suggestive of pulmonary hypertension.  Nuclear stress test noted a mild perfusion defect involving the inferolateral segments.  Clinically she continues to have precordial discomfort which originates from around the left shoulder blade and travels anteriorly.  The discomfort is at times brought on by effort related activities and resolves with rest.  Her echocardiogram also noted gallstones which may also be an etiology of her discomfort.  She is having additional testing regarding this.  She currently takes Lipitor once a week and her most recent fasting lipid profile noted an LDL of 154 mg/dL.  Shortness of breath has improved and her home SBP ranges between 120-130 mmHg.  FUNCTIONAL STATUS: No structured exercise program or daily routine -limited due to arthritis and  neuropathy.  ALLERGIES: Allergies  Allergen Reactions   Pregabalin     Dizziness   Cymbalta [Duloxetine Hcl] Other (See Comments)    Stomach pain   Gabapentin Itching   Lipitor [Atorvastatin] Other (See Comments)    Cramps   Lisinopril Swelling   Maxzide [Hydrochlorothiazide W-Triamterene] Other (See Comments)    Cramping    Niacin Itching   Pravastatin Sodium Other (See Comments)    Headache    Simvastatin Other (See Comments)    Memory loss   Triamterene Other (See Comments)    Unknown   Lodine [Etodolac] Itching    MEDICATION LIST PRIOR TO VISIT: Current Meds  Medication Sig   acetaminophen (TYLENOL) 500 MG tablet Take 1,000 mg by mouth 2 (two) times daily as needed for headache.   aspirin EC 81 MG tablet Take 1 tablet (81 mg total) by mouth daily. Swallow whole.   blood glucose meter kit and supplies KIT Dispense based on patient and insurance preference. Use up to four times daily as directed. (FOR ICD-9 250.00, 250.01).   cycloSPORINE (RESTASIS) 0.05 % ophthalmic emulsion Place 1 drop into both eyes 2 (two) times daily.   ergocalciferol (VITAMIN D2) 1.25 MG (50000 UT) capsule Take 50,000 Units by mouth once a week.   escitalopram (LEXAPRO) 20 MG tablet Take 20 mg by mouth daily.    ezetimibe (ZETIA) 10 MG tablet Take 10 mg by mouth at bedtime.    fluticasone (CUTIVATE) 0.05 % cream Apply 1 application topically 2 (two) times daily.   hydrochlorothiazide (HYDRODIURIL) 25 MG tablet Take 1 tablet (25 mg total) by mouth daily.   hydrOXYzine (ATARAX) 10 MG tablet Take 25 mg by mouth daily.   irbesartan (AVAPRO) 300 MG tablet Take 1 tablet (  300 mg total) by mouth at bedtime.   metFORMIN (GLUCOPHAGE) 500 MG tablet Take 1 tablet (500 mg total) by mouth daily with breakfast.   metoprolol tartrate (LOPRESSOR) 25 MG tablet Take 1 tablet (25 mg total) by mouth 2 (two) times daily for 5 days. Started 3 days before the CT scan (heart).   Omega-3 Fatty Acids (OMEGA-3 FISH OIL PO)  Take by mouth.   oxyCODONE (OXY IR/ROXICODONE) 5 MG immediate release tablet Take 1-2 tablets (5-10 mg total) by mouth every 4 (four) hours as needed for moderate pain or severe pain ((score 4 to 6)).   triamcinolone cream (KENALOG) 0.1 % Apply 1 application topically daily as needed for itching.   verapamil (VERELAN PM) 240 MG 24 hr capsule Take 240 mg by mouth at bedtime.   [DISCONTINUED] atorvastatin (LIPITOR) 20 MG tablet Take 20 mg by mouth daily.    Current Facility-Administered Medications for the 08/17/22 encounter (Office Visit) with Rex Kras, DO  Medication   betamethasone acetate-betamethasone sodium phosphate (CELESTONE) injection 3 mg     PAST MEDICAL HISTORY: Past Medical History:  Diagnosis Date   Anemia    Anxiety    takes Xanax daily   Arthritis    Back pain    Blood transfusion    Breast cancer ([place]) 2013   left breast   Bronchitis    hx of   Chronic fatigue syndrome    Depression    takes Lexapro daily   Diabetes mellitus without complication (HCC)    boderline   Diverticulosis    Edema    feet and legs   Headache(784.0)    History of colon polyps 1998   adenomatous   Hyperlipidemia    takes Niacin daily   Hypertension    takes Verapamil and Avapro daily   Joint pain    OSA (obstructive sleep apnea)    Personal history of radiation therapy    Pneumonia 4/12   Albuterol daily as needed   Radiation 10/29/11-11/24/11   left breast 6100 cGy   Shortness of breath    with exertion   Spinal stenosis    Tinnitus     PAST SURGICAL HISTORY: Past Surgical History:  Procedure Laterality Date   [place]     BIOPSY  10/06/2021   Procedure: BIOPSY;  Surgeon: Mauri Pole, MD;  Location: [facility];  Service: Endoscopy;;   BREAST EXCISIONAL BIOPSY Right 02/06/2013   BREAST LUMPECTOMY  08/2011   left   BREAST LUMPECTOMY WITH NEEDLE LOCALIZATION Right 02/06/2013   Procedure: RIGHT BREAST  LUMPECTOMY WITH NEEDLE LOCALIZATION;  Surgeon: Harl Bowie, MD;  Location: [facility];  Service: General;  Laterality: Right;   COLONOSCOPY W/ POLYPECTOMY     COLONOSCOPY WITH PROPOFOL N/A 10/27/2015   Procedure: COLONOSCOPY WITH PROPOFOL;  Surgeon: Mauri Pole, MD;  Location: [facility] [facility];  Service: Endoscopy;  Laterality: N/A;   COLONOSCOPY WITH PROPOFOL N/A 10/06/2021   Procedure: COLONOSCOPY WITH PROPOFOL;  Surgeon: Mauri Pole, MD;  Location: [facility];  Service: Endoscopy;  Laterality: N/A;   HARDWARE REMOVAL Left 04/20/2015   Procedure: HARDWARE REMOVAL LEFT LUMBAR FIVE SCREW;  Surgeon: Karie Chimera, MD;  Location: [facility];  Service: Neurosurgery;  Laterality: Left;   JOINT REPLACEMENT Bilateral    bilateral knee   POLYPECTOMY  1990's   POLYPECTOMY  10/06/2021   Procedure: POLYPECTOMY;  Surgeon: Mauri Pole, MD;  Location: [facility];  Service: Endoscopy;;   THYROID CYST EXCISION  1994   TOTAL KNEE ARTHROPLASTY  2006   bilateral   TRANSPHENOIDAL / TRANSNASAL HYPOPHYSECTOMY / RESECTION PITUITARY TUMOR  6/11    FAMILY HISTORY: The patient family history includes Breast cancer (age of onset: 9) in her sister; Cancer (age of onset: 39) in her sister; Clotting disorder in her father; Diabetes in her mother; Heart Problems in her father; Heart disease in her father; Hyperlipidemia in her mother; Hypertension in her father and mother; Obesity in her mother; Stroke in her father and mother.  SOCIAL HISTORY:  The patient  reports that she has never smoked. She has never used smokeless tobacco. She reports that she does not drink alcohol and does not use drugs.  REVIEW OF SYSTEMS: Review of Systems  Cardiovascular:  Positive for chest pain (see HPI) and dyspnea on exertion (chronic and stable). Negative for claudication, irregular heartbeat, leg swelling, near-syncope, orthopnea, palpitations, paroxysmal nocturnal dyspnea and syncope.  Respiratory:  Positive for  shortness of breath.   Hematologic/Lymphatic: Negative for bleeding problem.  Musculoskeletal:  Positive for arthritis. Negative for muscle cramps and myalgias.  Neurological:  Negative for dizziness and light-headedness.    PHYSICAL EXAM:    08/17/2022   11:38 AM 07/18/2022    9:15 AM 07/10/2022    9:31 AM  Vitals with BMI  Height '5\' 6"'$  '5\' 6"'$    Weight 273 lbs 13 oz 266 lbs 13 oz   BMI 08.67 61.95   Systolic 093 267 124  Diastolic 74 81 82  Pulse 59 81 99    Physical Exam  Constitutional: No distress.  Age appropriate, hemodynamically stable.   Neck: No JVD present.  Cardiovascular: Normal rate, regular rhythm, S1 normal, S2 normal, intact distal pulses and normal pulses. Exam reveals no gallop, no S3 and no S4.  No murmur heard. Pulmonary/Chest: Effort normal and breath sounds normal. No stridor. She has no wheezes. She has no rales.  Abdominal: Soft. Bowel sounds are normal. She exhibits no distension. There is no abdominal tenderness.  Musculoskeletal:        General: No edema.     Cervical back: Neck supple.  Neurological: She is alert and oriented to person, place, and time. She has intact cranial nerves (2-12).  Skin: Skin is warm and moist.   RADIOLOGY: CT chest high-resolution 06/21/2022: 1. No evidence of fibrotic interstitial lung disease. 2. Mild air trapping, findings can be seen in the setting of small airways disease. 3. Dilated main pulmonary artery, findings can be seen in the setting of pulmonary arterial hypertension. 4. Hepatic steatosis. 5. Mild coronary artery calcifications of the LAD. 6. Cardiomegaly and aortic Atherosclerosis (ICD10-I70.0).  CARDIAC DATABASE: EKG: 07/18/2022: Normal sinus rhythm, 75 bpm, ST depressions in the lateral leads cannot rule out ischemia.  Echocardiogram: 07/27/2022:  Normal LV systolic function with visual EF 55-60%. Left ventricle cavity is normal in size. Severe concentric hypertrophy of the left ventricle. Normal  global wall motion. Doppler evidence of grade I (impaired) diastolic dysfunction, normal LAP. Calculated EF 57%. Left atrial cavity is mildly dilated. Mild tricuspid regurgitation. No evidence of pulmonary hypertension. No prior available for comparison. Incidental finding of gallstones present, correlate clinically.    Stress Testing: Lexiscan Tetrofosmin stress test 08/14/2022: Lexiscan nuclear stress test performed using 1-day protocol. SPECT images show small sized, mild intensity, reversible perfusion defect in basal inferolateral myocardium. Stress LVEF 60%. Low risk study.   Heart Catheterization: None  LABORATORY DATA: Collected: 02/07/2022 Total cholesterol  217, triglycerides 137, HDL 41, calculated LDL 152, non-HDL 177.  External Labs: Collected: 08/04/2022 provided by PCP. TSH 1.61. A1c 6.6 Hemoglobin 10.8, hematocrit 33.9% BUN 21, creatinine 1.41. eGFR 38 Sodium 142, potassium 4.2, chloride 104, bicarb 30. AST 18, ALT 14, alkaline phosphatase 88 Total cholesterol 228, triglycerides 174, HDL 42, calculated LDL 154, non-HDL 186   IMPRESSION:    ICD-10-CM   1. Precordial pain  R07.2 CT CORONARY MORPH W/CTA COR W/SCORE W/CA W/CM &/OR WO/CM    2. Abnormal nuclear stress test  R94.39 metoprolol tartrate (LOPRESSOR) 25 MG tablet    Basic metabolic panel    CT CORONARY MORPH W/CTA COR W/SCORE W/CA W/CM &/OR WO/CM    3. Dyspnea on exertion  O27.03 Basic metabolic panel    4. Coronary artery calcification  I25.10 atorvastatin (LIPITOR) 20 MG tablet   J00.93 Basic metabolic panel    5. Atherosclerosis of aorta (HCC)  I70.0 atorvastatin (LIPITOR) 20 MG tablet    6. Essential hypertension  I10     7. OSA (obstructive sleep apnea)  G47.33     8. Pure hypercholesterolemia  E78.00     9. Non-insulin dependent type 2 diabetes mellitus (HCC)  E11.9 atorvastatin (LIPITOR) 20 MG tablet       RECOMMENDATIONS: [name] is a 77 y.o. African-American female whose  past medical history and cardiac risk factors include: Coronary artery calcification (nongated CT study 05/2022), aortic atherosclerosis, hypertension, hyperlipidemia, non-insulin-dependent diabetes mellitus type 2, sleep apnea, iron deficiency anemia, obesity due to excess calories.   Coronary artery calcification / atherosclerosis of aorta (HCC) Precordial pain. Dyspnea on exertion Precordial pain and dyspnea on exertion has not increased in intensity frequency or duration.  Her dyspnea on exertion is also multifactorial given other chronic comorbid conditions.  And precordial discomfort could also be originating from possible gallbladder etiology.  Given her symptoms and other cardiovascular risk factors shared decision was to proceed with additional workup. Will proceed with coronary CTA to rule out obstructive CAD. Continue aspirin. She currently takes atorvastatin 20 mg p.o. q. weekly.  She is willing to take it every day given her recent lipid profile.  Recommend a goal LDL less than 70 mg/dL at least. Continue Zetia. Check a BMP. Start Lopressor 25 mg p.o. twice daily 3 days prior to her coronary CTA. Further recommendations to follow.  Essential hypertension Office blood pressure better controlled compared to prior visits Home blood pressures are usually around 120-130 mmHg  OSA (obstructive sleep apnea) Reemphasized the importance of CPAP compliance. Currently managed by Dr. Halford Chessman of pulmonary medicine.  Pure hypercholesterolemia Currently on atorvastatin and Zetia.   Labs from January 2024 independently reviewed History of myalgias on other statin medications in the past. Her current lipid profile reflects her being on Lipitor 20 mg p.o. q. weekly. However given her recent lipid numbers she is willing to uptitrate Lipitor to 20 mg p.o. daily. If she is able to tolerate the dose of Lipitor would recommend repeat lipid profile and direct LDL in 6 weeks to reevaluate  therapy.  Non-insulin dependent type 2 diabetes mellitus ([place]) Reemphasized the importance of glycemic control. Currently on ARB, statin therapy. Consider Vania Rea -will defer to PCP for now.   FINAL MEDICATION LIST END OF ENCOUNTER: Meds ordered this encounter  Medications   metoprolol tartrate (LOPRESSOR) 25 MG tablet    Sig: Take 1 tablet (25 mg total) by mouth 2 (two) times daily for 5 days. Started 3 days before the CT scan (heart).  Dispense:  10 tablet    Refill:  0   atorvastatin (LIPITOR) 20 MG tablet    Sig: Take 1 tablet (20 mg total) by mouth at bedtime.    Dispense:  90 tablet    Refill:  0    Medications Discontinued During This Encounter  Medication Reason   amitriptyline (ELAVIL) 25 MG tablet Patient Preference   atorvastatin (LIPITOR) 20 MG tablet Reorder     Current Outpatient Medications:    acetaminophen (TYLENOL) 500 MG tablet, Take 1,000 mg by mouth 2 (two) times daily as needed for headache., Disp: , Rfl:    aspirin EC 81 MG tablet, Take 1 tablet (81 mg total) by mouth daily. Swallow whole., Disp: 90 tablet, Rfl: 0   blood glucose meter kit and supplies KIT, Dispense based on patient and insurance preference. Use up to four times daily as directed. (FOR ICD-9 250.00, 250.01)., Disp: 1 each, Rfl: 0   cycloSPORINE (RESTASIS) 0.05 % ophthalmic emulsion, Place 1 drop into both eyes 2 (two) times daily., Disp: , Rfl:    ergocalciferol (VITAMIN D2) 1.25 MG (50000 UT) capsule, Take 50,000 Units by mouth once a week., Disp: , Rfl:    escitalopram (LEXAPRO) 20 MG tablet, Take 20 mg by mouth daily. , Disp: , Rfl:    ezetimibe (ZETIA) 10 MG tablet, Take 10 mg by mouth at bedtime. , Disp: , Rfl:    fluticasone (CUTIVATE) 0.05 % cream, Apply 1 application topically 2 (two) times daily., Disp: , Rfl:    hydrochlorothiazide (HYDRODIURIL) 25 MG tablet, Take 1 tablet (25 mg total) by mouth daily., Disp: 30 tablet, Rfl: 0   hydrOXYzine (ATARAX) 10 MG tablet, Take 25 mg by  mouth daily., Disp: , Rfl:    irbesartan (AVAPRO) 300 MG tablet, Take 1 tablet (300 mg total) by mouth at bedtime., Disp: 30 tablet, Rfl: 0   metFORMIN (GLUCOPHAGE) 500 MG tablet, Take 1 tablet (500 mg total) by mouth daily with breakfast., Disp: 90 tablet, Rfl: 0   metoprolol tartrate (LOPRESSOR) 25 MG tablet, Take 1 tablet (25 mg total) by mouth 2 (two) times daily for 5 days. Started 3 days before the CT scan (heart)., Disp: 10 tablet, Rfl: 0   Omega-3 Fatty Acids (OMEGA-3 FISH OIL PO), Take by mouth., Disp: , Rfl:    oxyCODONE (OXY IR/ROXICODONE) 5 MG immediate release tablet, Take 1-2 tablets (5-10 mg total) by mouth every 4 (four) hours as needed for moderate pain or severe pain ((score 4 to 6))., Disp: 40 tablet, Rfl: 0   triamcinolone cream (KENALOG) 0.1 %, Apply 1 application topically daily as needed for itching., Disp: , Rfl:    verapamil (VERELAN PM) 240 MG 24 hr capsule, Take 240 mg by mouth at bedtime., Disp: , Rfl:    atorvastatin (LIPITOR) 20 MG tablet, Take 1 tablet (20 mg total) by mouth at bedtime., Disp: 90 tablet, Rfl: 0  Current Facility-Administered Medications:    betamethasone acetate-betamethasone sodium phosphate (CELESTONE) injection 3 mg, 3 mg, Intra-articular, Once, Amalia Hailey, Dorathy Daft, DPM  Orders Placed This Encounter  Procedures   CT CORONARY MORPH W/CTA COR W/SCORE W/CA W/CM &/OR WO/CM   Basic metabolic panel    There are no Patient Instructions on file for this visit.   --Continue cardiac medications as reconciled in final medication list. --Return in about 4 weeks (around 09/14/2022) for Follow up after CCTA. or sooner if needed. --Continue follow-up with your primary care physician regarding the management of your other chronic comorbid  conditions.  Patient's questions and concerns were addressed to her satisfaction. She voices understanding of the instructions provided during this encounter.   This note was created using a voice recognition software as a  result there may be grammatical errors inadvertently enclosed that do not reflect the nature of this encounter. Every attempt is made to correct such errors.  Rex Kras, [place], [facility]  Pager: [phone] Office: [phone]

## 2022-08-21 ENCOUNTER — Ambulatory Visit
Admission: RE | Admit: 2022-08-21 | Discharge: 2022-08-21 | Disposition: A | Discharge status: Home / Self Care | Payer: Medicare Other | Source: Ambulatory Visit | Attending: Family Medicine | Admitting: Family Medicine

## 2022-08-21 DIAGNOSIS — K8020 Calculus of gallbladder without cholecystitis without obstruction: Secondary | ICD-10-CM | POA: Diagnosis not present

## 2022-08-21 DIAGNOSIS — R9389 Abnormal findings on diagnostic imaging of other specified body structures: Secondary | ICD-10-CM

## 2022-08-22 DIAGNOSIS — M79641 Pain in right hand: Secondary | ICD-10-CM | POA: Diagnosis not present

## 2022-08-23 DIAGNOSIS — M7989 Other specified soft tissue disorders: Secondary | ICD-10-CM | POA: Diagnosis not present

## 2022-08-23 DIAGNOSIS — M2041 Other hammer toe(s) (acquired), right foot: Secondary | ICD-10-CM | POA: Diagnosis not present

## 2022-08-29 ENCOUNTER — Ambulatory Visit: Payer: Medicare Other | Admitting: Cardiology

## 2022-08-29 ENCOUNTER — Telehealth (HOSPITAL_COMMUNITY): Payer: Self-pay | Admitting: Emergency Medicine

## 2022-08-29 NOTE — Telephone Encounter (Signed)
Reaching out to patient to offer assistance regarding upcoming cardiac imaging study; pt verbalizes understanding of appt date/time, parking situation and where to check in, pre-test NPO status and medications ordered, and verified current allergies; name and call back number provided for further questions should they arise Marchia Bond RN Navigator Cardiac Imaging [facility] [phone] office [phone] cell  Arrival [address] entrance Denies iv issues '25mg'$  metoprolol 2 hr prior Hold HCTZ Hold metformin 48 hr post Aware contrast/nitro

## 2022-08-30 ENCOUNTER — Ambulatory Visit (HOSPITAL_COMMUNITY)
Admission: RE | Admit: 2022-08-30 | Discharge: 2022-08-30 | Disposition: A | Discharge status: Home / Self Care | Payer: Medicare Other | Source: Ambulatory Visit | Attending: Cardiology | Admitting: Cardiology

## 2022-08-30 DIAGNOSIS — R072 Precordial pain: Secondary | ICD-10-CM | POA: Insufficient documentation

## 2022-08-30 DIAGNOSIS — R9439 Abnormal result of other cardiovascular function study: Secondary | ICD-10-CM | POA: Diagnosis not present

## 2022-08-30 MED ORDER — NITROGLYCERIN 0.4 MG SL SUBL
SUBLINGUAL_TABLET | SUBLINGUAL | Status: AC
  Filled 2022-08-30: qty 2

## 2022-08-30 MED ORDER — NITROGLYCERIN 0.4 MG SL SUBL
0.8000 mg | SUBLINGUAL_TABLET | Freq: Once | SUBLINGUAL | Status: AC
  Administered 2022-08-30: 0.8 mg via SUBLINGUAL

## 2022-08-30 MED ORDER — IOHEXOL 350 MG/ML SOLN
115.0000 mL | Freq: Once | INTRAVENOUS | Status: AC | PRN
  Administered 2022-08-30: 115 mL via INTRAVENOUS

## 2022-09-04 DIAGNOSIS — R9439 Abnormal result of other cardiovascular function study: Secondary | ICD-10-CM | POA: Insufficient documentation

## 2022-09-04 DIAGNOSIS — R072 Precordial pain: Secondary | ICD-10-CM | POA: Diagnosis not present

## 2022-09-06 ENCOUNTER — Telehealth: Payer: Self-pay

## 2022-09-06 NOTE — Telephone Encounter (Signed)
Patient requested results of CT she had on 08/30/22. Results have not been released yet. Advised patient of when her follow up appointment is scheduled. Patient verbalized understanding.

## 2022-09-12 ENCOUNTER — Other Ambulatory Visit: Payer: Self-pay | Admitting: Family Medicine

## 2022-09-12 DIAGNOSIS — Z1231 Encounter for screening mammogram for malignant neoplasm of breast: Secondary | ICD-10-CM

## 2022-09-15 ENCOUNTER — Ambulatory Visit: Payer: Medicare Other | Admitting: Cardiology

## 2022-09-15 ENCOUNTER — Encounter: Payer: Self-pay | Admitting: Cardiology

## 2022-09-15 VITALS — BP 137/76 | HR 65 | Resp 16 | Ht 66.0 in | Wt 278.2 lb

## 2022-09-15 DIAGNOSIS — E7800 Pure hypercholesterolemia, unspecified: Secondary | ICD-10-CM

## 2022-09-15 DIAGNOSIS — E119 Type 2 diabetes mellitus without complications: Secondary | ICD-10-CM | POA: Diagnosis not present

## 2022-09-15 DIAGNOSIS — I700 Atherosclerosis of aorta: Secondary | ICD-10-CM

## 2022-09-15 DIAGNOSIS — I2510 Atherosclerotic heart disease of native coronary artery without angina pectoris: Secondary | ICD-10-CM

## 2022-09-15 DIAGNOSIS — G4733 Obstructive sleep apnea (adult) (pediatric): Secondary | ICD-10-CM | POA: Diagnosis not present

## 2022-09-15 DIAGNOSIS — I10 Essential (primary) hypertension: Secondary | ICD-10-CM

## 2022-09-15 DIAGNOSIS — I2584 Coronary atherosclerosis due to calcified coronary lesion: Secondary | ICD-10-CM | POA: Diagnosis not present

## 2022-09-15 DIAGNOSIS — R0609 Other forms of dyspnea: Secondary | ICD-10-CM | POA: Diagnosis not present

## 2022-09-15 MED ORDER — EMPAGLIFLOZIN 10 MG PO TABS: 10.0000 mg | ORAL_TABLET | Freq: Every day | ORAL | 0 refills | Status: DC

## 2022-09-15 NOTE — Progress Notes (Signed)
ID:  [name], DOB [birth year], MRN [id number]  PCP:  Shirline Frees, MD  Cardiologist:  Rex Kras, DO, [facility] (established care 07/18/2022)  Date: 09/15/22 Last Office Visit: 08/17/2022  Chief Complaint  Patient presents with   Follow-up    Reevaluation of chest pain, shortness of breath.  Discuss test results    HPI  [name] is a 77 y.o. African-American female whose past medical history and cardiovascular risk factors include: Coronary artery calcification (nongated CT study 05/2022), aortic atherosclerosis, hypertension, hyperlipidemia, non-insulin-dependent diabetes mellitus type 2, sleep apnea, iron deficiency anemia, obesity due to excess calories  Patient was referred to the practice for coronary artery calcification, cardiomegaly, and dilated pulmonary artery noted on nongated CT study.  Given her multiple cardiovascular risk factors she underwent ischemic workup including a stress test which noted a mild perfusion defect involving the inferolateral segments.  At the last office visit given her precordial pain, dyspnea on exertion, and other cardiovascular risk factors that shared decision was to proceed with coronary CTA.  She presents today to review her symptoms as well as the results.  Since last office visit she is no longer experiencing precordial pain.  Denies heart failure symptoms.  In the past she was taking Lipitor once a week and at the last visit I recommended that she takes it daily.  She has been doing so and has not had any side effects.  And her shortness of breath has improved significantly with up titration of antihypertensive medications.  Her home SBP ranges between 130-154mHg.   FUNCTIONAL STATUS: No structured exercise program or daily routine -limited due to arthritis and neuropathy.  ALLERGIES: Allergies  Allergen Reactions   Pregabalin     Dizziness   Cymbalta [Duloxetine Hcl] Other (See Comments)    Stomach pain   Gabapentin  Itching   Lipitor [Atorvastatin] Other (See Comments)    Cramps   Lisinopril Swelling   Maxzide [Hydrochlorothiazide W-Triamterene] Other (See Comments)    Cramping    Niacin Itching   Pravastatin Sodium Other (See Comments)    Headache    Simvastatin Other (See Comments)    Memory loss   Triamterene Other (See Comments)    Unknown   Lodine [Etodolac] Itching    MEDICATION LIST PRIOR TO VISIT: Current Meds  Medication Sig   acetaminophen (TYLENOL) 500 MG tablet Take 1,000 mg by mouth 2 (two) times daily as needed for headache.   aspirin EC 81 MG tablet Take 1 tablet (81 mg total) by mouth daily. Swallow whole.   atorvastatin (LIPITOR) 20 MG tablet Take 1 tablet (20 mg total) by mouth at bedtime.   blood glucose meter kit and supplies KIT Dispense based on patient and insurance preference. Use up to four times daily as directed. (FOR ICD-9 250.00, 250.01).   clotrimazole-betamethasone (LOTRISONE) cream Apply 1 Application topically 2 (two) times daily.   cycloSPORINE (RESTASIS) 0.05 % ophthalmic emulsion Place 1 drop into both eyes 2 (two) times daily.   empagliflozin (JARDIANCE) 10 MG TABS tablet Take 1 tablet (10 mg total) by mouth daily before breakfast.   ergocalciferol (VITAMIN D2) 1.25 MG (50000 UT) capsule Take 50,000 Units by mouth once a week.   escitalopram (LEXAPRO) 20 MG tablet Take 20 mg by mouth daily.    ezetimibe (ZETIA) 10 MG tablet Take 10 mg by mouth at bedtime.    fluticasone (CUTIVATE) 0.05 % cream Apply 1 application topically 2 (two) times daily.   hydrochlorothiazide (HYDRODIURIL) 25 MG tablet Take  1 tablet (25 mg total) by mouth daily.   hydrOXYzine (ATARAX) 10 MG tablet Take 25 mg by mouth daily.   irbesartan (AVAPRO) 300 MG tablet Take 1 tablet (300 mg total) by mouth at bedtime.   metFORMIN (GLUCOPHAGE) 500 MG tablet Take 1 tablet (500 mg total) by mouth daily with breakfast.   Omega-3 Fatty Acids (OMEGA-3 FISH OIL PO) Take by mouth.   verapamil  (VERELAN PM) 240 MG 24 hr capsule Take 240 mg by mouth at bedtime.     PAST MEDICAL HISTORY: Past Medical History:  Diagnosis Date   Anemia    Anxiety    takes Xanax daily   Arthritis    Back pain    Blood transfusion    Breast cancer ([place]) 2013   left breast   Bronchitis    hx of   Chronic fatigue syndrome    Depression    takes Lexapro daily   Diabetes mellitus without complication (HCC)    boderline   Diverticulosis    Edema    feet and legs   Headache(784.0)    History of colon polyps 1998   adenomatous   Hyperlipidemia    takes Niacin daily   Hypertension    takes Verapamil and Avapro daily   Joint pain    OSA (obstructive sleep apnea)    Personal history of radiation therapy    Pneumonia 4/12   Albuterol daily as needed   Radiation 10/29/11-11/24/11   left breast 6100 cGy   Shortness of breath    with exertion   Spinal stenosis    Tinnitus     PAST SURGICAL HISTORY: Past Surgical History:  Procedure Laterality Date   [place]     BIOPSY  10/06/2021   Procedure: BIOPSY;  Surgeon: Mauri Pole, MD;  Location: [facility];  Service: Endoscopy;;   BREAST EXCISIONAL BIOPSY Right 02/06/2013   BREAST LUMPECTOMY  08/2011   left   BREAST LUMPECTOMY WITH NEEDLE LOCALIZATION Right 02/06/2013   Procedure: RIGHT BREAST LUMPECTOMY WITH NEEDLE LOCALIZATION;  Surgeon: Harl Bowie, MD;  Location: [facility];  Service: General;  Laterality: Right;   COLONOSCOPY W/ POLYPECTOMY     COLONOSCOPY WITH PROPOFOL N/A 10/27/2015   Procedure: COLONOSCOPY WITH PROPOFOL;  Surgeon: Mauri Pole, MD;  Location: [facility];  Service: Endoscopy;  Laterality: N/A;   COLONOSCOPY WITH PROPOFOL N/A 10/06/2021   Procedure: COLONOSCOPY WITH PROPOFOL;  Surgeon: Mauri Pole, MD;  Location: [facility];  Service: Endoscopy;  Laterality: N/A;   HARDWARE REMOVAL Left 04/20/2015   Procedure: HARDWARE REMOVAL LEFT LUMBAR FIVE  SCREW;  Surgeon: Karie Chimera, MD;  Location: [facility];  Service: Neurosurgery;  Laterality: Left;   JOINT REPLACEMENT Bilateral    bilateral knee   POLYPECTOMY  1990's   POLYPECTOMY  10/06/2021   Procedure: POLYPECTOMY;  Surgeon: Mauri Pole, MD;  Location: [facility];  Service: Endoscopy;;   THYROID CYST EXCISION  1994   TOTAL KNEE ARTHROPLASTY  2006   bilateral   TRANSPHENOIDAL / TRANSNASAL HYPOPHYSECTOMY / RESECTION PITUITARY TUMOR  6/11    FAMILY HISTORY: The patient family history includes Breast cancer (age of onset: 72) in her sister; Cancer (age of onset: 54) in her sister; Clotting disorder in her father; Diabetes in her mother; Heart Problems in her father; Heart disease in her father; Hyperlipidemia in her mother; Hypertension in her father and mother; Obesity in her mother;  Stroke in her father and mother.  SOCIAL HISTORY:  The patient  reports that she has never smoked. She has never used smokeless tobacco. She reports that she does not drink alcohol and does not use drugs.  REVIEW OF SYSTEMS: Review of Systems  Cardiovascular:  Positive for dyspnea on exertion (chronic and stable-improving). Negative for chest pain, claudication, irregular heartbeat, leg swelling, near-syncope, orthopnea, palpitations, paroxysmal nocturnal dyspnea and syncope.  Respiratory:  Positive for shortness of breath (Improving).   Hematologic/Lymphatic: Negative for bleeding problem.  Musculoskeletal:  Positive for arthritis. Negative for muscle cramps and myalgias.  Neurological:  Negative for dizziness and light-headedness.    PHYSICAL EXAM:    09/15/2022    8:58 AM 08/30/2022    9:34 AM 08/30/2022    9:02 AM  Vitals with BMI  Height 5' 6"$     Weight 278 lbs 3 oz    BMI 0000000    Systolic 0000000 [id number]  Diastolic 76 68 70  Pulse 65 55     Physical Exam  Constitutional: No distress.  Age appropriate, hemodynamically stable.   Neck: No JVD present.  Cardiovascular: Normal rate,  regular rhythm, S1 normal, S2 normal, intact distal pulses and normal pulses. Exam reveals no gallop, no S3 and no S4.  No murmur heard. Pulmonary/Chest: Effort normal and breath sounds normal. No stridor. She has no wheezes. She has no rales.  Abdominal: Soft. Bowel sounds are normal. She exhibits no distension. There is no abdominal tenderness.  Musculoskeletal:        General: No edema.     Cervical back: Neck supple.  Neurological: She is alert and oriented to person, place, and time. She has intact cranial nerves (2-12).  Skin: Skin is warm and moist.   RADIOLOGY: CT chest high-resolution 06/21/2022: 1. No evidence of fibrotic interstitial lung disease. 2. Mild air trapping, findings can be seen in the setting of small airways disease. 3. Dilated main pulmonary artery, findings can be seen in the setting of pulmonary arterial hypertension. 4. Hepatic steatosis. 5. Mild coronary artery calcifications of the LAD. 6. Cardiomegaly and aortic Atherosclerosis (ICD10-I70.0).  CARDIAC DATABASE: EKG: 09/15/2022: Sinus bradycardia, 59 bpm, LVH per voltage criteria, without underlying injury pattern.  Echocardiogram: 07/27/2022:  Normal LV systolic function with visual EF 55-60%. Left ventricle cavity is normal in size. Severe concentric hypertrophy of the left ventricle. Normal global wall motion. Doppler evidence of grade I (impaired) diastolic dysfunction, normal LAP. Calculated EF 57%. Left atrial cavity is mildly dilated. Mild tricuspid regurgitation. No evidence of pulmonary hypertension. No prior available for comparison. Incidental finding of gallstones present, correlate clinically.    Stress Testing: Lexiscan Tetrofosmin stress test 08/14/2022: Lexiscan nuclear stress test performed using 1-day protocol. SPECT images show small sized, mild intensity, reversible perfusion defect in basal inferolateral myocardium. Stress LVEF 60%. Low risk study.   Heart  Catheterization: None  Coronary CTA 09/04/2022 Coronary artery calcification score: Left main: 0 Left anterior descending artery: 36 Left circumflex artery: 0 Right coronary artery: 0.  Total coronary calcium score of 36.9, places the patient at the 50th percentile for age and sex matched control.  2. Normal coronary origin with right dominance.  3. Aortic atherosclerosis.  4. CAD-RADS = 1. Left Main: Patent. LAD: Minimal stenosis (0-24%) due to calcified plaque in the mid LAD remainder of the vessel is patent. LCX: Patent. RCA: Patent.  Noncardiac findings: 1. No acute extracardiac findings. 2. Cardiomegaly.   LABORATORY DATA: Collected: 02/07/2022 Total cholesterol 217, triglycerides  137, HDL 41, calculated LDL 152, non-HDL 177.  External Labs: Collected: 08/04/2022 provided by PCP. TSH 1.61. A1c 6.6 Hemoglobin 10.8, hematocrit 33.9% BUN 21, creatinine 1.41. eGFR 38 Sodium 142, potassium 4.2, chloride 104, bicarb 30. AST 18, ALT 14, alkaline phosphatase 88 Total cholesterol 228, triglycerides 174, HDL 42, calculated LDL 154, non-HDL 186   IMPRESSION:    ICD-10-CM   1. Coronary artery calcification  I25.10 empagliflozin (JARDIANCE) 10 MG TABS tablet   I25.84     2. Dyspnea on exertion  R06.09 EKG 12-Lead    3. Atherosclerosis of aorta (HCC)  I70.0 empagliflozin (JARDIANCE) 10 MG TABS tablet    4. Essential hypertension  I10     5. OSA (obstructive sleep apnea)  G47.33     6. Pure hypercholesterolemia  E78.00 Lipid Panel With LDL/HDL Ratio    LDL cholesterol, direct    CMP14+EGFR    7. Non-insulin dependent type 2 diabetes mellitus (HCC)  E11.9 empagliflozin (JARDIANCE) 10 MG TABS tablet    Lipid Panel With LDL/HDL Ratio    LDL cholesterol, direct    CMP14+EGFR       RECOMMENDATIONS: [name] is a 77 y.o. African-American female whose past medical history and cardiac risk factors include: Coronary artery calcification (nongated CT study  05/2022), aortic atherosclerosis, hypertension, hyperlipidemia, non-insulin-dependent diabetes mellitus type 2, sleep apnea, iron deficiency anemia, obesity due to excess calories.   Coronary artery calcification / atherosclerosis of aorta (HCC) Dyspnea on exertion Total CAC 36, placing her at the 50th percentile. CAD RADS 1 per most recent coronary CTA. No longer experiencing precordial pain. Dyspnea on exertion overall improving. Reemphasized the importance of improving her modifiable cardiovascular risk factors. Continue aspirin and statin therapy. Since January 2024 she is been taking Lipitor 20 mg p.o. nightly regularly as opposed to weekly.  Will recheck fasting lipid profile after she has been on current therapy for 6 weeks.  Labs ordered.  Essential hypertension Office blood pressure better controlled compared to prior visits Medications reconciled Home SBP ranging between 130-147 mmHg. Will add Jardiance 10 mg p.o. nightly which will help with HTN and diabetes.  No strong history of urinary tract infections.  Labs in 1 week to reevaluate kidney function and electrolytes.  And to annual 1 year We will review her blood pressure readings and medication changes with PCP.  OSA (obstructive sleep apnea) Reemphasized the importance of CPAP compliance. Currently managed by Dr. Halford Chessman of pulmonary medicine.  Pure hypercholesterolemia Currently on atorvastatin and Zetia.   Labs from January 2024 independently reviewed History of myalgias on other statin medications in the past. Her current lipid profile reflects her being on Lipitor 20 mg p.o. q. weekly. She is not taking Lipitor 20 mg p.o. nightly on a daily basis.  Will check a fasting lipid profile to reevaluate therapy.    Non-insulin dependent type 2 diabetes mellitus ([place]) Reemphasized the importance of glycemic control. Currently on ARB, statin therapy. I have added Jardiance to help reduce her cardiovascular risk given her risk  factors and elevated blood pressures.   No significant history of urinary tract infections in the past. Patient is asked to be on medications for at least 1 week and then have labs to reevaluate kidney function and electrolytes.   FINAL MEDICATION LIST END OF ENCOUNTER: Meds ordered this encounter  Medications   empagliflozin (JARDIANCE) 10 MG TABS tablet    Sig: Take 1 tablet (10 mg total) by mouth daily before breakfast.    Dispense:  90 tablet    Refill:  0    Medications Discontinued During This Encounter  Medication Reason   betamethasone acetate-betamethasone sodium phosphate (CELESTONE) injection 3 mg    metoprolol tartrate (LOPRESSOR) 25 MG tablet    oxyCODONE (OXY IR/ROXICODONE) 5 MG immediate release tablet    triamcinolone cream (KENALOG) 0.1 %      Current Outpatient Medications:    acetaminophen (TYLENOL) 500 MG tablet, Take 1,000 mg by mouth 2 (two) times daily as needed for headache., Disp: , Rfl:    aspirin EC 81 MG tablet, Take 1 tablet (81 mg total) by mouth daily. Swallow whole., Disp: 90 tablet, Rfl: 0   atorvastatin (LIPITOR) 20 MG tablet, Take 1 tablet (20 mg total) by mouth at bedtime., Disp: 90 tablet, Rfl: 0   blood glucose meter kit and supplies KIT, Dispense based on patient and insurance preference. Use up to four times daily as directed. (FOR ICD-9 250.00, 250.01)., Disp: 1 each, Rfl: 0   clotrimazole-betamethasone (LOTRISONE) cream, Apply 1 Application topically 2 (two) times daily., Disp: , Rfl:    cycloSPORINE (RESTASIS) 0.05 % ophthalmic emulsion, Place 1 drop into both eyes 2 (two) times daily., Disp: , Rfl:    empagliflozin (JARDIANCE) 10 MG TABS tablet, Take 1 tablet (10 mg total) by mouth daily before breakfast., Disp: 90 tablet, Rfl: 0   ergocalciferol (VITAMIN D2) 1.25 MG (50000 UT) capsule, Take 50,000 Units by mouth once a week., Disp: , Rfl:    escitalopram (LEXAPRO) 20 MG tablet, Take 20 mg by mouth daily. , Disp: , Rfl:    ezetimibe (ZETIA)  10 MG tablet, Take 10 mg by mouth at bedtime. , Disp: , Rfl:    fluticasone (CUTIVATE) 0.05 % cream, Apply 1 application topically 2 (two) times daily., Disp: , Rfl:    hydrochlorothiazide (HYDRODIURIL) 25 MG tablet, Take 1 tablet (25 mg total) by mouth daily., Disp: 30 tablet, Rfl: 0   hydrOXYzine (ATARAX) 10 MG tablet, Take 25 mg by mouth daily., Disp: , Rfl:    irbesartan (AVAPRO) 300 MG tablet, Take 1 tablet (300 mg total) by mouth at bedtime., Disp: 30 tablet, Rfl: 0   metFORMIN (GLUCOPHAGE) 500 MG tablet, Take 1 tablet (500 mg total) by mouth daily with breakfast., Disp: 90 tablet, Rfl: 0   Omega-3 Fatty Acids (OMEGA-3 FISH OIL PO), Take by mouth., Disp: , Rfl:    verapamil (VERELAN PM) 240 MG 24 hr capsule, Take 240 mg by mouth at bedtime., Disp: , Rfl:   Orders Placed This Encounter  Procedures   Lipid Panel With LDL/HDL Ratio   LDL cholesterol, direct   CMP14+EGFR   EKG 12-Lead    There are no Patient Instructions on file for this visit.   --Continue cardiac medications as reconciled in final medication list. --Return in about 1 year (around 09/16/2023) for Annual follow up visit, Coronary artery calcification. or sooner if needed. --Continue follow-up with your primary care physician regarding the management of your other chronic comorbid conditions.  Patient's questions and concerns were addressed to her satisfaction. She voices understanding of the instructions provided during this encounter.   This note was created using a voice recognition software as a result there may be grammatical errors inadvertently enclosed that do not reflect the nature of this encounter. Every attempt is made to correct such errors.  Rex Kras, [place], [facility]  Pager: [phone] Office: [phone]

## 2022-10-02 DIAGNOSIS — E119 Type 2 diabetes mellitus without complications: Secondary | ICD-10-CM | POA: Diagnosis not present

## 2022-10-02 DIAGNOSIS — E7800 Pure hypercholesterolemia, unspecified: Secondary | ICD-10-CM | POA: Diagnosis not present

## 2022-10-03 LAB — CMP14+EGFR
ALT: 13 IU/L (ref 0–32)
AST: 16 IU/L (ref 0–40)
Albumin/Globulin Ratio: 1.3 (ref 1.2–2.2)
Albumin: 3.9 g/dL (ref 3.8–4.8)
Alkaline Phosphatase: 89 IU/L (ref 44–121)
BUN/Creatinine Ratio: 9 — ABNORMAL LOW (ref 12–28)
BUN: 15 mg/dL (ref 8–27)
Bilirubin Total: 0.3 mg/dL (ref 0.0–1.2)
CO2: 20 mmol/L (ref 20–29)
Calcium: 10.7 mg/dL — ABNORMAL HIGH (ref 8.7–10.3)
Chloride: 104 mmol/L (ref 96–106)
Creatinine, Ser: 1.64 mg/dL — ABNORMAL HIGH (ref 0.57–1.00)
Globulin, Total: 3.1 g/dL (ref 1.5–4.5)
Glucose: 123 mg/dL — ABNORMAL HIGH (ref 70–99)
Potassium: 4.3 mmol/L (ref 3.5–5.2)
Sodium: 143 mmol/L (ref 134–144)
Total Protein: 7 g/dL (ref 6.0–8.5)
eGFR: 32 mL/min/{1.73_m2} — ABNORMAL LOW (ref >59)

## 2022-10-03 LAB — LIPID PANEL WITH LDL/HDL RATIO
Cholesterol, Total: 152 mg/dL (ref 100–199)
HDL: 45 mg/dL (ref >39)
LDL Chol Calc (NIH): 89 mg/dL (ref 0–99)
LDL/HDL Ratio: 2 ratio (ref 0.0–3.2)
Triglycerides: 97 mg/dL (ref 0–149)
VLDL Cholesterol Cal: 18 mg/dL (ref 5–40)

## 2022-10-03 LAB — LDL CHOLESTEROL, DIRECT: LDL Direct: 87 mg/dL (ref 0–99)

## 2022-10-05 DIAGNOSIS — H35033 Hypertensive retinopathy, bilateral: Secondary | ICD-10-CM | POA: Diagnosis not present

## 2022-10-05 DIAGNOSIS — H353132 Nonexudative age-related macular degeneration, bilateral, intermediate dry stage: Secondary | ICD-10-CM | POA: Diagnosis not present

## 2022-10-05 DIAGNOSIS — E119 Type 2 diabetes mellitus without complications: Secondary | ICD-10-CM | POA: Diagnosis not present

## 2022-10-05 DIAGNOSIS — H40023 Open angle with borderline findings, high risk, bilateral: Secondary | ICD-10-CM | POA: Diagnosis not present

## 2022-10-09 ENCOUNTER — Ambulatory Visit: Payer: Medicare Other | Admitting: Podiatry

## 2022-10-09 DIAGNOSIS — M7751 Other enthesopathy of right foot: Secondary | ICD-10-CM | POA: Diagnosis not present

## 2022-10-09 DIAGNOSIS — B351 Tinea unguium: Secondary | ICD-10-CM | POA: Diagnosis not present

## 2022-10-09 DIAGNOSIS — M79675 Pain in left toe(s): Secondary | ICD-10-CM | POA: Diagnosis not present

## 2022-10-09 DIAGNOSIS — M79674 Pain in right toe(s): Secondary | ICD-10-CM | POA: Diagnosis not present

## 2022-10-09 NOTE — Progress Notes (Signed)
SUBJECTIVE Patient presents to office today complaining of elongated, thickened nails that cause pain while ambulating in shoes.  Patient is unable to trim their own nails.  Patient states that about 2 weeks ago she did experience some left heel pain however it is since subsided.  She no longer experiences the pain.  She cannot recall an incident or injury.  Last visit on 07/10/2022 she did have some right second digit pain as well as pain to the lateral aspect of the right ankle.  She states that the injection did not help the lateral aspect of the right ankle.  She also went to another physician office and was diagnosed with possible gout of the second toe of the right foot.  Patient states that currently it is asymptomatic and nonpainful patient is here for further evaluation and treatment.  Past Medical History:  Diagnosis Date   Anemia    Anxiety    takes Xanax daily   Arthritis    Back pain    Blood transfusion    Breast cancer ([place]) 2013   left breast   Bronchitis    hx of   Chronic fatigue syndrome    Depression    takes Lexapro daily   Diabetes mellitus without complication (HCC)    boderline   Diverticulosis    Edema    feet and legs   Headache(784.0)    History of colon polyps 1998   adenomatous   Hyperlipidemia    takes Niacin daily   Hypertension    takes Verapamil and Avapro daily   Joint pain    OSA (obstructive sleep apnea)    Personal history of radiation therapy    Pneumonia 4/12   Albuterol daily as needed   Radiation 10/29/11-11/24/11   left breast 6100 cGy   Shortness of breath    with exertion   Spinal stenosis    Tinnitus     OBJECTIVE General Patient is awake, alert, and oriented x 3 and in no acute distress. Derm Skin is dry and supple bilateral. Negative open lesions or macerations. Remaining integument unremarkable. Nails are tender, long, thickened and dystrophic with subungual debris, consistent with onychomycosis, 1-5 bilateral. No signs of  infection noted. Vasc  DP and PT pedal pulses palpable bilaterally. Temperature gradient within normal limits.  Neuro Epicritic and protective threshold sensation grossly intact bilaterally.  Musculoskeletal Exam No symptomatic pedal deformities noted bilateral. Muscular strength within normal limits.  No major pain with palpation of the left heel both lateral and medial.  Patient states there is only some slight tenderness to the lateral aspect of the heel.  ASSESSMENT 1.  Pain due to onychomycosis of toenails both 2.  DIPJ capsulitis/possible arthritis vs acute gout right second digit; resolved/asymptomatic  PLAN OF CARE 1. Patient evaluated today.  2. Instructed to maintain good pedal hygiene and foot care.  3. Mechanical debridement of nails 1-5 bilaterally performed using a nail nipper. Filed with dremel without incident.  4.  Recommend good supportive shoes and sneakers that do not irritate the heel  5.  For now we will simply observe the second toe.  If the patient does have acute flareups of the DIPJ of the second digit we did discuss the possibility of possible arthroplasty. 6.  Return to clinic in 3 mos.    Edrick Kins, DPM [facility]  Dr. Edrick Kins, DPM    [address]  [place], [place] [address]                Office [phone]  Fax [phone]

## 2022-10-25 ENCOUNTER — Ambulatory Visit
Admission: RE | Admit: 2022-10-25 | Discharge: 2022-10-25 | Disposition: A | Discharge status: Home / Self Care | Payer: Medicare Other | Source: Ambulatory Visit | Attending: Family Medicine | Admitting: Family Medicine

## 2022-10-25 DIAGNOSIS — Z1231 Encounter for screening mammogram for malignant neoplasm of breast: Secondary | ICD-10-CM

## 2022-10-27 DIAGNOSIS — G4733 Obstructive sleep apnea (adult) (pediatric): Secondary | ICD-10-CM | POA: Diagnosis not present

## 2022-11-03 DIAGNOSIS — N183 Chronic kidney disease, stage 3 unspecified: Secondary | ICD-10-CM | POA: Diagnosis not present

## 2022-11-03 DIAGNOSIS — M150 Primary generalized (osteo)arthritis: Secondary | ICD-10-CM | POA: Diagnosis not present

## 2022-11-03 DIAGNOSIS — E1122 Type 2 diabetes mellitus with diabetic chronic kidney disease: Secondary | ICD-10-CM | POA: Diagnosis not present

## 2022-11-03 DIAGNOSIS — R5383 Other fatigue: Secondary | ICD-10-CM | POA: Diagnosis not present

## 2022-11-03 DIAGNOSIS — D509 Iron deficiency anemia, unspecified: Secondary | ICD-10-CM | POA: Diagnosis not present

## 2022-11-03 DIAGNOSIS — E1142 Type 2 diabetes mellitus with diabetic polyneuropathy: Secondary | ICD-10-CM | POA: Diagnosis not present

## 2022-11-20 DIAGNOSIS — M778 Other enthesopathies, not elsewhere classified: Secondary | ICD-10-CM | POA: Diagnosis not present

## 2022-11-27 ENCOUNTER — Other Ambulatory Visit: Payer: Self-pay | Admitting: Cardiology

## 2022-11-27 DIAGNOSIS — I700 Atherosclerosis of aorta: Secondary | ICD-10-CM

## 2022-11-27 DIAGNOSIS — E119 Type 2 diabetes mellitus without complications: Secondary | ICD-10-CM

## 2022-11-27 DIAGNOSIS — I2510 Atherosclerotic heart disease of native coronary artery without angina pectoris: Secondary | ICD-10-CM

## 2022-12-05 DIAGNOSIS — M48062 Spinal stenosis, lumbar region with neurogenic claudication: Secondary | ICD-10-CM | POA: Diagnosis not present

## 2022-12-05 DIAGNOSIS — Z981 Arthrodesis status: Secondary | ICD-10-CM | POA: Diagnosis not present

## 2022-12-11 ENCOUNTER — Other Ambulatory Visit: Payer: Self-pay | Admitting: Cardiology

## 2022-12-11 DIAGNOSIS — I2510 Atherosclerotic heart disease of native coronary artery without angina pectoris: Secondary | ICD-10-CM

## 2022-12-11 DIAGNOSIS — E119 Type 2 diabetes mellitus without complications: Secondary | ICD-10-CM

## 2022-12-11 DIAGNOSIS — I700 Atherosclerosis of aorta: Secondary | ICD-10-CM

## 2022-12-12 DIAGNOSIS — M79642 Pain in left hand: Secondary | ICD-10-CM | POA: Diagnosis not present

## 2023-01-01 DIAGNOSIS — M545 Low back pain, unspecified: Secondary | ICD-10-CM | POA: Diagnosis not present

## 2023-01-01 DIAGNOSIS — M62830 Muscle spasm of back: Secondary | ICD-10-CM | POA: Diagnosis not present

## 2023-01-01 DIAGNOSIS — M25551 Pain in right hip: Secondary | ICD-10-CM | POA: Diagnosis not present

## 2023-01-08 ENCOUNTER — Ambulatory Visit: Payer: Medicare Other | Admitting: Podiatry

## 2023-01-08 DIAGNOSIS — M79675 Pain in left toe(s): Secondary | ICD-10-CM | POA: Diagnosis not present

## 2023-01-08 DIAGNOSIS — B351 Tinea unguium: Secondary | ICD-10-CM

## 2023-01-08 DIAGNOSIS — M79674 Pain in right toe(s): Secondary | ICD-10-CM

## 2023-01-08 NOTE — Progress Notes (Signed)
Chief Complaint  Patient presents with   Routine Foot Care    3 month RFC     SUBJECTIVE Patient with a history of diabetes mellitus presents to office today complaining of elongated, thickened nails that cause pain while ambulating in shoes.  Patient is unable to trim their own nails. Patient is here for further evaluation and treatment.  Past Medical History:  Diagnosis Date   Anemia    Anxiety    takes Xanax daily   Arthritis    Back pain    Blood transfusion    Breast cancer (HCC) 2013   left breast   Bronchitis    hx of   Chronic fatigue syndrome    Depression    takes Lexapro daily   Diabetes mellitus without complication (HCC)    boderline   Diverticulosis    Edema    feet and legs   Headache(784.0)    History of colon polyps 1998   adenomatous   Hyperlipidemia    takes Niacin daily   Hypertension    takes Verapamil and Avapro daily   Joint pain    OSA (obstructive sleep apnea)    Personal history of radiation therapy    Pneumonia 4/12   Albuterol daily as needed   Radiation 10/29/11-11/24/11   left breast 6100 cGy   Shortness of breath    with exertion   Spinal stenosis    Tinnitus     Allergies  Allergen Reactions   Pregabalin     Dizziness   Cymbalta [Duloxetine Hcl] Other (See Comments)    Stomach pain   Gabapentin Itching   Lipitor [Atorvastatin] Other (See Comments)    Cramps   Lisinopril Swelling   Maxzide [Hydrochlorothiazide W-Triamterene] Other (See Comments)    Cramping    Niacin Itching   Pravastatin Sodium Other (See Comments)    Headache    Simvastatin Other (See Comments)    Memory loss   Triamterene Other (See Comments)    Unknown   Lodine [Etodolac] Itching     OBJECTIVE General Patient is awake, alert, and oriented x 3 and in no acute distress. Derm Skin is dry and supple bilateral. Negative open lesions or macerations. Remaining integument unremarkable. Nails are tender, long, thickened and dystrophic with  subungual debris, consistent with onychomycosis, 1-5 bilateral. No signs of infection noted. Vasc  DP and PT pedal pulses palpable bilaterally. Temperature gradient within normal limits.  Neuro Epicritic and protective threshold sensation diminished bilaterally.  Musculoskeletal Exam No symptomatic pedal deformities noted bilateral. Muscular strength within normal limits.  ASSESSMENT 1. Diabetes Mellitus w/ peripheral neuropathy 2.  Pain due to onychomycosis of toenails bilateral  PLAN OF CARE 1. Patient evaluated today. 2. Instructed to maintain good pedal hygiene and foot care. Stressed importance of controlling blood sugar.  3. Mechanical debridement of nails 1-5 bilaterally performed using a nail nipper. Filed with dremel without incident.  4. Return to clinic in 3 mos.     Felecia Shelling, DPM [facility]  Dr. Felecia Shelling, DPM    [address] [place], [place] [address]                Office [phone]  Fax [phone]

## 2023-01-17 DIAGNOSIS — M545 Low back pain, unspecified: Secondary | ICD-10-CM | POA: Diagnosis not present

## 2023-02-03 DIAGNOSIS — M5416 Radiculopathy, lumbar region: Secondary | ICD-10-CM | POA: Diagnosis not present

## 2023-02-09 DIAGNOSIS — M5136 Other intervertebral disc degeneration, lumbar region: Secondary | ICD-10-CM | POA: Diagnosis not present

## 2023-02-09 DIAGNOSIS — M5451 Vertebrogenic low back pain: Secondary | ICD-10-CM | POA: Diagnosis not present

## 2023-02-09 DIAGNOSIS — M4856XA Collapsed vertebra, not elsewhere classified, lumbar region, initial encounter for fracture: Secondary | ICD-10-CM | POA: Diagnosis not present

## 2023-02-13 DIAGNOSIS — M545 Low back pain, unspecified: Secondary | ICD-10-CM | POA: Diagnosis not present

## 2023-02-13 DIAGNOSIS — Z0000 Encounter for general adult medical examination without abnormal findings: Secondary | ICD-10-CM | POA: Diagnosis not present

## 2023-02-13 DIAGNOSIS — I700 Atherosclerosis of aorta: Secondary | ICD-10-CM | POA: Diagnosis not present

## 2023-02-13 DIAGNOSIS — M150 Primary generalized (osteo)arthritis: Secondary | ICD-10-CM | POA: Diagnosis not present

## 2023-02-13 DIAGNOSIS — I10 Essential (primary) hypertension: Secondary | ICD-10-CM | POA: Diagnosis not present

## 2023-02-13 DIAGNOSIS — E1142 Type 2 diabetes mellitus with diabetic polyneuropathy: Secondary | ICD-10-CM | POA: Diagnosis not present

## 2023-02-13 DIAGNOSIS — D509 Iron deficiency anemia, unspecified: Secondary | ICD-10-CM | POA: Diagnosis not present

## 2023-02-13 DIAGNOSIS — G4733 Obstructive sleep apnea (adult) (pediatric): Secondary | ICD-10-CM | POA: Diagnosis not present

## 2023-02-13 DIAGNOSIS — E1122 Type 2 diabetes mellitus with diabetic chronic kidney disease: Secondary | ICD-10-CM | POA: Diagnosis not present

## 2023-02-13 DIAGNOSIS — N183 Chronic kidney disease, stage 3 unspecified: Secondary | ICD-10-CM | POA: Diagnosis not present

## 2023-03-05 ENCOUNTER — Other Ambulatory Visit: Payer: Self-pay | Admitting: Cardiology

## 2023-03-05 DIAGNOSIS — I2510 Atherosclerotic heart disease of native coronary artery without angina pectoris: Secondary | ICD-10-CM

## 2023-03-05 DIAGNOSIS — E119 Type 2 diabetes mellitus without complications: Secondary | ICD-10-CM

## 2023-03-05 DIAGNOSIS — I700 Atherosclerosis of aorta: Secondary | ICD-10-CM

## 2023-03-06 DIAGNOSIS — S32020A Wedge compression fracture of second lumbar vertebra, initial encounter for closed fracture: Secondary | ICD-10-CM | POA: Diagnosis not present

## 2023-03-13 ENCOUNTER — Other Ambulatory Visit: Payer: Self-pay | Admitting: Family Medicine

## 2023-03-13 DIAGNOSIS — E2839 Other primary ovarian failure: Secondary | ICD-10-CM

## 2023-04-02 DIAGNOSIS — R509 Fever, unspecified: Secondary | ICD-10-CM | POA: Diagnosis not present

## 2023-04-02 DIAGNOSIS — U071 COVID-19: Secondary | ICD-10-CM | POA: Diagnosis not present

## 2023-04-02 DIAGNOSIS — R051 Acute cough: Secondary | ICD-10-CM | POA: Diagnosis not present

## 2023-04-13 DIAGNOSIS — S32010G Wedge compression fracture of first lumbar vertebra, subsequent encounter for fracture with delayed healing: Secondary | ICD-10-CM | POA: Diagnosis not present

## 2023-04-13 DIAGNOSIS — S32020A Wedge compression fracture of second lumbar vertebra, initial encounter for closed fracture: Secondary | ICD-10-CM | POA: Diagnosis not present

## 2023-04-21 ENCOUNTER — Other Ambulatory Visit: Payer: Self-pay

## 2023-04-21 ENCOUNTER — Emergency Department (HOSPITAL_BASED_OUTPATIENT_CLINIC_OR_DEPARTMENT_OTHER)
Admission: EM | Admit: 2023-04-21 | Discharge: 2023-04-21 | Disposition: A | Discharge status: Home / Self Care | Payer: Medicare Other | Attending: Emergency Medicine | Admitting: Emergency Medicine

## 2023-04-21 ENCOUNTER — Emergency Department (HOSPITAL_BASED_OUTPATIENT_CLINIC_OR_DEPARTMENT_OTHER): Payer: Medicare Other

## 2023-04-21 ENCOUNTER — Encounter (HOSPITAL_BASED_OUTPATIENT_CLINIC_OR_DEPARTMENT_OTHER): Payer: Self-pay

## 2023-04-21 DIAGNOSIS — D72829 Elevated white blood cell count, unspecified: Secondary | ICD-10-CM | POA: Diagnosis not present

## 2023-04-21 DIAGNOSIS — E119 Type 2 diabetes mellitus without complications: Secondary | ICD-10-CM | POA: Diagnosis not present

## 2023-04-21 DIAGNOSIS — Z7984 Long term (current) use of oral hypoglycemic drugs: Secondary | ICD-10-CM | POA: Insufficient documentation

## 2023-04-21 DIAGNOSIS — Z79899 Other long term (current) drug therapy: Secondary | ICD-10-CM | POA: Diagnosis not present

## 2023-04-21 DIAGNOSIS — N200 Calculus of kidney: Secondary | ICD-10-CM | POA: Diagnosis not present

## 2023-04-21 DIAGNOSIS — I10 Essential (primary) hypertension: Secondary | ICD-10-CM | POA: Diagnosis not present

## 2023-04-21 DIAGNOSIS — M25551 Pain in right hip: Secondary | ICD-10-CM | POA: Insufficient documentation

## 2023-04-21 DIAGNOSIS — R1031 Right lower quadrant pain: Secondary | ICD-10-CM | POA: Diagnosis not present

## 2023-04-21 DIAGNOSIS — K5730 Diverticulosis of large intestine without perforation or abscess without bleeding: Secondary | ICD-10-CM | POA: Diagnosis not present

## 2023-04-21 DIAGNOSIS — R109 Unspecified abdominal pain: Secondary | ICD-10-CM

## 2023-04-21 DIAGNOSIS — K8020 Calculus of gallbladder without cholecystitis without obstruction: Secondary | ICD-10-CM | POA: Diagnosis not present

## 2023-04-21 LAB — URINALYSIS, ROUTINE W REFLEX MICROSCOPIC
Bilirubin Urine: NEGATIVE
Glucose, UA: NEGATIVE mg/dL
Hgb urine dipstick: NEGATIVE
Ketones, ur: NEGATIVE mg/dL
Nitrite: NEGATIVE
Protein, ur: NEGATIVE mg/dL
Specific Gravity, Urine: 1.011 (ref 1.005–1.030)
pH: 6.5 (ref 5.0–8.0)

## 2023-04-21 LAB — COMPREHENSIVE METABOLIC PANEL
ALT: 10 U/L (ref 0–44)
AST: 12 U/L — ABNORMAL LOW (ref 15–41)
Albumin: 4 g/dL (ref 3.5–5.0)
Alkaline Phosphatase: 73 U/L (ref 38–126)
Anion gap: 10 (ref 5–15)
BUN: 18 mg/dL (ref 8–23)
CO2: 26 mmol/L (ref 22–32)
Calcium: 10.7 mg/dL — ABNORMAL HIGH (ref 8.9–10.3)
Chloride: 103 mmol/L (ref 98–111)
Creatinine, Ser: 1.28 mg/dL — ABNORMAL HIGH (ref 0.44–1.00)
GFR, Estimated: 43 mL/min — ABNORMAL LOW (ref >60)
Glucose, Bld: 92 mg/dL (ref 70–99)
Potassium: 3.8 mmol/L (ref 3.5–5.1)
Sodium: 139 mmol/L (ref 135–145)
Total Bilirubin: 0.6 mg/dL (ref 0.3–1.2)
Total Protein: 7.5 g/dL (ref 6.5–8.1)

## 2023-04-21 LAB — CBC WITH DIFFERENTIAL/PLATELET
Abs Immature Granulocytes: 0.02 10*3/uL (ref 0.00–0.07)
Basophils Absolute: 0 10*3/uL (ref 0.0–0.1)
Basophils Relative: 0 %
Eosinophils Absolute: 0.1 10*3/uL (ref 0.0–0.5)
Eosinophils Relative: 2 %
HCT: 34 % — ABNORMAL LOW (ref 36.0–46.0)
Hemoglobin: 11.1 g/dL — ABNORMAL LOW (ref 12.0–15.0)
Immature Granulocytes: 0 %
Lymphocytes Relative: 23 %
Lymphs Abs: 1.7 10*3/uL (ref 0.7–4.0)
MCH: 25.6 pg — ABNORMAL LOW (ref 26.0–34.0)
MCHC: 32.6 g/dL (ref 30.0–36.0)
MCV: 78.5 fL — ABNORMAL LOW (ref 80.0–100.0)
Monocytes Absolute: 0.6 10*3/uL (ref 0.1–1.0)
Monocytes Relative: 8 %
Neutro Abs: 4.9 10*3/uL (ref 1.7–7.7)
Neutrophils Relative %: 67 %
Platelets: 269 10*3/uL (ref 150–400)
RBC: 4.33 MIL/uL (ref 3.87–5.11)
RDW: 16.1 % — ABNORMAL HIGH (ref 11.5–15.5)
WBC: 7.4 10*3/uL (ref 4.0–10.5)
nRBC: 0 % (ref 0.0–0.2)

## 2023-04-21 MED ORDER — IOHEXOL 300 MG/ML  SOLN
100.0000 mL | Freq: Once | INTRAMUSCULAR | Status: AC | PRN
  Administered 2023-04-21: 100 mL via INTRAVENOUS

## 2023-04-21 NOTE — Discharge Instructions (Signed)
Recommend Tylenol and MiraLAX for pain.  Follow-up with your primary care doctor to discuss further pain management.

## 2023-04-21 NOTE — ED Provider Notes (Addendum)
[place] EMERGENCY DEPARTMENT AT [facility] Provider Note   CSN: [id number] Arrival date & time: 04/21/23  1145     History  Chief Complaint  Patient presents with   Hip Pain         [name] is a 77 y.o. female with a past medical history of hypertension and type 2 diabetes mellitus who presents emergency department for chronic right hip and RLQ pain. The patient reported that her right-sided pain started in June and has not changed in intensity.  She reported chronic constipation that has not changed.  Patient reported increase in pain in both abdominal and right hip pain while she is moving.  She denied urinary symptoms, nausea, vomiting, fever, chills, chest pain, shortness of breath, recent falls.  Patient is currently seen at [facility] and is currently prescribed hydrocodone for pain which she takes 1 to 2 pills/day.     Home Medications Prior to Admission medications   Medication Sig Start Date End Date Taking? Authorizing Provider  acetaminophen (TYLENOL) 500 MG tablet Take 1,000 mg by mouth 2 (two) times daily as needed for headache.    [provider]  atorvastatin (LIPITOR) 20 MG tablet TAKE 1 TABLET(20 MG) BY MOUTH AT BEDTIME 03/05/23   Tolia, Sunit, DO  blood glucose meter kit and supplies KIT Dispense based on patient and insurance preference. Use up to four times daily as directed. (FOR ICD-9 250.00, 250.01). 06/07/17   Demetrio Lapping, PA-C  clotrimazole-betamethasone (LOTRISONE) cream Apply 1 Application topically 2 (two) times daily.    [provider]  cycloSPORINE (RESTASIS) 0.05 % ophthalmic emulsion Place 1 drop into both eyes 2 (two) times daily.    [provider]  ergocalciferol (VITAMIN D2) 1.25 MG (50000 UT) capsule Take 50,000 Units by mouth once a week.    [provider]  escitalopram (LEXAPRO) 20 MG tablet Take 20 mg by mouth daily.     [provider]  ezetimibe (ZETIA) 10 MG tablet Take 10  mg by mouth at bedtime.  01/13/19   [provider]  fluticasone (CUTIVATE) 0.05 % cream Apply 1 application topically 2 (two) times daily. 11/25/18   [provider]  hydrochlorothiazide (HYDRODIURIL) 25 MG tablet Take 1 tablet (25 mg total) by mouth daily. 11/24/19   Alois Cliche, PA-C  hydrOXYzine (ATARAX) 10 MG tablet Take 25 mg by mouth daily. 07/25/21   [provider]  irbesartan (AVAPRO) 300 MG tablet Take 1 tablet (300 mg total) by mouth at bedtime. 08/23/17   Demetrio Lapping, PA-C  JARDIANCE 10 MG TABS tablet TAKE 1 TABLET(10 MG) BY MOUTH DAILY BEFORE BREAKFAST 12/13/22   Tolia, Sunit, DO  metFORMIN (GLUCOPHAGE) 500 MG tablet Take 1 tablet (500 mg total) by mouth daily with breakfast. 10/30/19   Alois Cliche, PA-C  Omega-3 Fatty Acids (OMEGA-3 FISH OIL PO) Take by mouth.    [provider]  verapamil (VERELAN PM) 240 MG 24 hr capsule Take 240 mg by mouth at bedtime.    [provider]      Allergies    Pregabalin, Cymbalta [duloxetine hcl], Gabapentin, Lipitor [atorvastatin], Lisinopril, Maxzide [hydrochlorothiazide w-triamterene], Niacin, Pravastatin sodium, Simvastatin, Triamterene, and Lodine [etodolac]    Review of Systems   Review of Systems  Constitutional:  Negative for chills and fever.  Respiratory:  Negative for shortness of breath.   Cardiovascular:  Negative for chest pain.  Gastrointestinal:  Positive for abdominal pain and constipation. Negative for nausea.  Genitourinary:  Negative for difficulty urinating, dysuria and hematuria.  Musculoskeletal:  Positive for back pain.  Neurological:  Negative for weakness.    Physical Exam Updated Vital Signs BP (!) 175/78   Pulse 73   Temp 98.6 F (37 C) (Oral)   Resp 16   Ht 5\' 6"  (1.676 m)   Wt 129.3 kg   SpO2 99%   BMI 46.00 kg/m  Physical Exam Vitals reviewed. Exam conducted with a chaperone present.  Constitutional:      General: She is not in acute distress.     Appearance: She is morbidly obese. She is not ill-appearing, toxic-appearing or diaphoretic.  HENT:     Head: Normocephalic.  Cardiovascular:     Rate and Rhythm: Normal rate and regular rhythm.     Heart sounds: Normal heart sounds.  Pulmonary:     Effort: Pulmonary effort is normal.     Breath sounds: Normal breath sounds.  Abdominal:     General: Abdomen is protuberant. A surgical scar is present. Bowel sounds are normal. There is no distension.     Palpations: Abdomen is soft.     Tenderness: There is no abdominal tenderness. There is no guarding. Negative signs include McBurney's sign.  Musculoskeletal:     Right hip: Tenderness present. No crepitus. Normal range of motion. Normal strength.  Skin:    General: Skin is warm and dry.  Neurological:     Mental Status: She is alert.     ED Results / Procedures / Treatments   Labs (all labs ordered are listed, but only abnormal results are displayed) Labs Reviewed  CBC WITH DIFFERENTIAL/PLATELET - Abnormal; Notable for the following components:      Result Value   Hemoglobin 11.1 (*)    HCT 34.0 (*)    MCV 78.5 (*)    MCH 25.6 (*)    RDW 16.1 (*)    All other components within normal limits  URINALYSIS, ROUTINE W REFLEX MICROSCOPIC - Abnormal; Notable for the following components:   Color, Urine COLORLESS (*)    Leukocytes,Ua SMALL (*)    Bacteria, UA RARE (*)    All other components within normal limits  COMPREHENSIVE METABOLIC PANEL    EKG None  Radiology No results found.  Procedures Procedures    Medications Ordered in ED Medications - No data to display  ED Course/ Medical Decision Making/ A&P                                 Medical Decision Making Patient is a 77 year old female presents emergency department for evaluation of right lower quadrant abdominal pain and right hip pain.  She is status post appendectomy.  Initial evaluation, she is hypertensive 175/78 and other vitals are within normal  limits.  Physical exam does not show right lower quadrant tenderness to palpation but does show right hip pain to palpation.  Initial etiologies considered are right hip osteoarthritis, constipation, possible hernia, colonic mass, UTI.  Due to right lower quadrant pain, we will obtain a CT of the abdomen to evaluate for intra-abdominal etiologies.  Please see attending attestation/no for follow-up of plan.  Amount and/or Complexity of Data Reviewed Labs: ordered.    Details: CBC stable Urinalysis shows small leukocytes. Radiology: ordered.           Final Clinical Impression(s) / ED Diagnoses Final diagnoses:  None    Rx / DC Orders ED Discharge Orders  None         Faith Rogue, DO 04/21/23 1437    Faith Rogue, DO 04/21/23 1438    Alvira Monday, MD 04/21/23 2252

## 2023-04-21 NOTE — ED Provider Notes (Signed)
CT scans unremarkable.  I have no concern for cholecystitis.  She has no hernia on exams.  She has no midline spinal tenderness.  Overall she does have some weakening of her rectus abdominis muscles which could be contributing to some discomfort when she is walking.  But she has no bowel obstruction or incarcerated hernia.  She has no evidence of ureteral stone.  Overall seems like this is more of a chronic pain process.  Recommend Tylenol and MiraLAX and have her follow-up with primary care doctor.  Discharged in good condition.  This chart was dictated using voice recognition software.  Despite best efforts to proofread,  errors can occur which can change the documentation meaning.    Virgina Norfolk, DO 04/21/23 1556

## 2023-04-21 NOTE — ED Notes (Signed)
Transport to ct

## 2023-04-21 NOTE — ED Triage Notes (Addendum)
Pt presents with R hip pain that radiates down the lateral side since June. Pain is worse with movement. Denies N/V/D or urinary symptoms. Pt was seen previously by UC, orthopedic and pt has been told her problem originates in her back for a fx. Pt is here for another opinion. Pt takes hydrocodone for pain with temporary relief.

## 2023-04-27 DIAGNOSIS — E559 Vitamin D deficiency, unspecified: Secondary | ICD-10-CM | POA: Diagnosis not present

## 2023-04-27 DIAGNOSIS — L299 Pruritus, unspecified: Secondary | ICD-10-CM | POA: Diagnosis not present

## 2023-04-27 DIAGNOSIS — S32020D Wedge compression fracture of second lumbar vertebra, subsequent encounter for fracture with routine healing: Secondary | ICD-10-CM | POA: Diagnosis not present

## 2023-04-27 DIAGNOSIS — Z23 Encounter for immunization: Secondary | ICD-10-CM | POA: Diagnosis not present

## 2023-05-01 DIAGNOSIS — B354 Tinea corporis: Secondary | ICD-10-CM | POA: Diagnosis not present

## 2023-05-14 ENCOUNTER — Encounter: Payer: Self-pay | Admitting: Podiatry

## 2023-05-14 ENCOUNTER — Ambulatory Visit: Payer: Medicare Other | Admitting: Podiatry

## 2023-05-14 VITALS — HR 67 | Temp 98.4°F | Resp 20 | Ht 66.0 in | Wt 285.0 lb

## 2023-05-14 DIAGNOSIS — M79674 Pain in right toe(s): Secondary | ICD-10-CM

## 2023-05-14 DIAGNOSIS — B351 Tinea unguium: Secondary | ICD-10-CM

## 2023-05-14 DIAGNOSIS — M79675 Pain in left toe(s): Secondary | ICD-10-CM | POA: Diagnosis not present

## 2023-05-14 NOTE — Progress Notes (Signed)
Chief Complaint  Patient presents with   Diabetes    RM9: DFC/ nail trim/ hx of left foot neuropathy    SUBJECTIVE Patient with a history of diabetes mellitus presents to office today complaining of elongated, thickened nails that cause pain while ambulating in shoes.  Patient is unable to trim their own nails. Patient is here for further evaluation and treatment.  Past Medical History:  Diagnosis Date   Anemia    Anxiety    takes Xanax daily   Arthritis    Back pain    Blood transfusion    Breast cancer (HCC) 2013   left breast   Bronchitis    hx of   Chronic fatigue syndrome    Depression    takes Lexapro daily   Diabetes mellitus without complication (HCC)    boderline   Diverticulosis    Edema    feet and legs   Headache(784.0)    History of colon polyps 1998   adenomatous   Hyperlipidemia    takes Niacin daily   Hypertension    takes Verapamil and Avapro daily   Joint pain    OSA (obstructive sleep apnea)    Personal history of radiation therapy    Pneumonia 4/12   Albuterol daily as needed   Radiation 10/29/11-11/24/11   left breast 6100 cGy   Shortness of breath    with exertion   Spinal stenosis    Tinnitus     Allergies  Allergen Reactions   Pregabalin     Dizziness   Cymbalta [Duloxetine Hcl] Other (See Comments)    Stomach pain   Gabapentin Itching   Lipitor [Atorvastatin] Other (See Comments)    Cramps   Lisinopril Swelling   Maxzide [Hydrochlorothiazide W-Triamterene] Other (See Comments)    Cramping    Niacin Itching   Pravastatin Sodium Other (See Comments)    Headache    Simvastatin Other (See Comments)    Memory loss   Triamterene Other (See Comments)    Unknown   Lodine [Etodolac] Itching     OBJECTIVE General Patient is awake, alert, and oriented x 3 and in no acute distress. Derm Skin is dry and supple bilateral. Negative open lesions or macerations. Remaining integument unremarkable. Nails are tender, long, thickened  and dystrophic with subungual debris, consistent with onychomycosis, 1-5 bilateral. No signs of infection noted. Vasc  DP and PT pedal pulses palpable bilaterally. Temperature gradient within normal limits.  Neuro Epicritic and protective threshold sensation diminished bilaterally.  Musculoskeletal Exam No symptomatic pedal deformities noted bilateral. Muscular strength within normal limits.  ASSESSMENT 1. Diabetes Mellitus w/ peripheral neuropathy 2.  Pain due to onychomycosis of toenails bilateral  PLAN OF CARE 1. Patient evaluated today. 2. Instructed to maintain good pedal hygiene and foot care. Stressed importance of controlling blood sugar.  3. Mechanical debridement of nails 1-5 bilaterally performed using a nail nipper. Filed with dremel without incident.  4. Return to clinic in 3 mos.     Felecia Shelling, DPM [facility]  Dr. Felecia Shelling, DPM    [address] [place], [place] [address]                Office [phone]  Fax [phone]

## 2023-06-11 ENCOUNTER — Other Ambulatory Visit: Payer: Self-pay | Admitting: Cardiology

## 2023-06-11 DIAGNOSIS — I2510 Atherosclerotic heart disease of native coronary artery without angina pectoris: Secondary | ICD-10-CM

## 2023-06-11 DIAGNOSIS — I700 Atherosclerosis of aorta: Secondary | ICD-10-CM

## 2023-06-11 DIAGNOSIS — E119 Type 2 diabetes mellitus without complications: Secondary | ICD-10-CM

## 2023-06-12 ENCOUNTER — Ambulatory Visit: Payer: Medicare Other | Admitting: Primary Care

## 2023-08-07 ENCOUNTER — Telehealth: Payer: Self-pay | Admitting: Pulmonary Disease

## 2023-08-07 NOTE — Telephone Encounter (Signed)
 Cpap company went out of business. Usually she got automatic supplies. She states she called us  arounf Christmas time but I see no tel encounter. She does not recall who she spoke with. I adv her she has not been seen in a year and will need appt. For order to be filled. Made appt. NFN

## 2023-08-08 ENCOUNTER — Ambulatory Visit (HOSPITAL_BASED_OUTPATIENT_CLINIC_OR_DEPARTMENT_OTHER): Payer: Medicare Other | Admitting: Pulmonary Disease

## 2023-08-08 ENCOUNTER — Encounter (HOSPITAL_BASED_OUTPATIENT_CLINIC_OR_DEPARTMENT_OTHER): Payer: Self-pay | Admitting: Pulmonary Disease

## 2023-08-08 VITALS — BP 128/78 | HR 93 | Resp 18 | Ht 66.0 in | Wt 279.4 lb

## 2023-08-08 DIAGNOSIS — G4733 Obstructive sleep apnea (adult) (pediatric): Secondary | ICD-10-CM | POA: Diagnosis not present

## 2023-08-08 NOTE — Progress Notes (Signed)
 Subjective:    Patient ID: [name], female    DOB: [birth year], 78 y.o.   MRN: [id number]  HPI 78 y.o. female with obstructive sleep apnea.  VS pt  PMH : HTN, HA, Anemia, Spinal stenosis, Breast cancer 2013, Depression, Sleep paralysis related to sleep apnea, DM type 2   Discussed the use of AI scribe software for clinical note transcription with the patient, who gave verbal consent to proceed.  History of Present Illness   The patient, with a history of sleep apnea, presents with difficulty obtaining CPAP supplies. She reports that the company previously providing her supplies, [facility], has not sent any equipment since July. The patient has attempted to resolve the issue by contacting other companies and her insurance, but was advised to see her doctor. She lives off of [place] and the company was initially located on [address], then moved to [employer], and eventually began mailing the supplies. The patient is unsure why the supplies stopped coming, but notes that the company is now called [employer].  The patient uses a nasal mask and reports that the machine is working well, but she needs new filters, a hose, and nasal pillows. She has been cleaning her equipment with a SoClean machine, but has not changed the filter in a year. She reports that she cannot sleep without the machine and if she dozes off without it, she develops a cough. She also reports neuropathy and a nerve issue in her feet from a previous operation.       CPAP download was reviewed which shows excellent control of events on auto settings 5 to 20 cm with average pressure 12.5 and maximum pressure of 13.6 cm.  She is very compliant without a single missed night, leak is minimal, residual AHI seems to fluctuate up to 10 events per hour.  CPAP is only helped improve her daytime somnolence and fatigue  Significant tests/ events reviewed  PSG 08/05/04 >> AHI 139 Auto CPAP 05/08/22 to 06/06/22 >> used on 30 of  30 nights with average 8 hrs 14 min.  Average AHI 9.8 with median CPAP 9 and 95 th percentile CPAP 11 cm H2O  Review of Systems neg for any significant sore throat, dysphagia, itching, sneezing, nasal congestion or excess/ purulent secretions, fever, chills, sweats, unintended wt loss, pleuritic or exertional cp, hempoptysis, orthopnea pnd or change in chronic leg swelling. Also denies presyncope, palpitations, heartburn, abdominal pain, nausea, vomiting, diarrhea or change in bowel or urinary habits, dysuria,hematuria, rash, arthralgias, visual complaints, headache, numbness weakness or ataxia.'    Objective:   Physical Exam  Gen. Pleasant, obese, in no distress ENT - no lesions, no post nasal drip Neck: No JVD, no thyromegaly, no carotid bruits Lungs: no use of accessory muscles, no dullness to percussion, decreased without rales or rhonchi  Cardiovascular: Rhythm regular, heart sounds  normal, no murmurs or gallops, no peripheral edema Musculoskeletal: No deformities, no cyanosis or clubbing , no tremors       Assessment & Plan:    Assessment and Plan    Obstructive Sleep Apnea Difficulty obtaining CPAP equipment since July due to supplier transition from [facility] to [employer]. Compliant with CPAP therapy, reducing apnea episodes from over 100 per hour to about 3 per hour. Current CPAP machine functioning well but requires new supplies (filters, hose, nasal mask). Uses SoClean machine for cleaning but advised against it due to lack of safety studies. Informed that less than 5 apnea episodes per  hour is a good outcome. Emphasized importance of maintaining equipment and regular filter changes. - Send prescription for CPAP supplies to [facility] - Advise immediate CPAP filter change - Instruct to call office if supplies are not received by Monday - Schedule annual follow-up appointment  Peripheral Neuropathy Neuropathy in both feet, pain exacerbated by previous operation. No new  treatment or management changes discussed. - Monitor symptoms and manage as needed  General Health Maintenance Well-managed blood pressure, no significant issues aside from discussed conditions. - Continue current blood pressure management - Monitor for any new symptoms or changes in health.

## 2023-08-08 NOTE — Patient Instructions (Addendum)
 X CPAP supplies , may need new DME - we will check with [facility]  X change to auto settings 8 to 14 cm

## 2023-08-15 ENCOUNTER — Ambulatory Visit: Payer: Medicare Other | Admitting: Podiatry

## 2023-08-15 DIAGNOSIS — M109 Gout, unspecified: Secondary | ICD-10-CM | POA: Diagnosis not present

## 2023-08-20 ENCOUNTER — Telehealth (HOSPITAL_BASED_OUTPATIENT_CLINIC_OR_DEPARTMENT_OTHER): Payer: Self-pay | Admitting: Pulmonary Disease

## 2023-08-20 NOTE — Telephone Encounter (Signed)
New, [name], [name]; [name], [facility],  This order was received on 08/10/23. Our resupply team took the order on 08/13/23 @ 1115am.  its currently in process.  Thank you,  Luellen Pucker

## 2023-08-20 NOTE — Telephone Encounter (Signed)
Patient states Dr. Vassie Loll has ordered new CPAP supplies for her two weeks ago to be delivered and called [employer] last week who said they have not received anything. Advised patient to call again and let us know if she is still having issues per referral in patients chart it was received

## 2023-08-21 NOTE — Telephone Encounter (Signed)
PT states she called [employer] this morning and they can find no order. They keep switching her from place to place and nobody knows anything.  Asking Raven to call PT to advise. Her # is [phone]

## 2023-08-24 DIAGNOSIS — I10 Essential (primary) hypertension: Secondary | ICD-10-CM | POA: Diagnosis not present

## 2023-08-24 DIAGNOSIS — N183 Chronic kidney disease, stage 3 unspecified: Secondary | ICD-10-CM | POA: Diagnosis not present

## 2023-08-24 DIAGNOSIS — M545 Low back pain, unspecified: Secondary | ICD-10-CM | POA: Diagnosis not present

## 2023-08-24 DIAGNOSIS — E1142 Type 2 diabetes mellitus with diabetic polyneuropathy: Secondary | ICD-10-CM | POA: Diagnosis not present

## 2023-08-24 DIAGNOSIS — M109 Gout, unspecified: Secondary | ICD-10-CM | POA: Diagnosis not present

## 2023-08-24 DIAGNOSIS — E7800 Pure hypercholesterolemia, unspecified: Secondary | ICD-10-CM | POA: Diagnosis not present

## 2023-08-24 DIAGNOSIS — D509 Iron deficiency anemia, unspecified: Secondary | ICD-10-CM | POA: Diagnosis not present

## 2023-09-12 ENCOUNTER — Ambulatory Visit
Admission: RE | Admit: 2023-09-12 | Discharge: 2023-09-12 | Disposition: A | Discharge status: Home / Self Care | Payer: Medicare Other | Source: Ambulatory Visit | Attending: Family Medicine | Admitting: Family Medicine

## 2023-09-12 DIAGNOSIS — E2839 Other primary ovarian failure: Secondary | ICD-10-CM

## 2023-09-14 ENCOUNTER — Ambulatory Visit: Payer: Self-pay | Admitting: Cardiology

## 2023-09-17 ENCOUNTER — Emergency Department (HOSPITAL_BASED_OUTPATIENT_CLINIC_OR_DEPARTMENT_OTHER): Payer: Medicare Other

## 2023-09-17 ENCOUNTER — Emergency Department (HOSPITAL_BASED_OUTPATIENT_CLINIC_OR_DEPARTMENT_OTHER): Payer: Medicare Other | Admitting: Radiology

## 2023-09-17 ENCOUNTER — Other Ambulatory Visit: Payer: Self-pay

## 2023-09-17 ENCOUNTER — Emergency Department (HOSPITAL_BASED_OUTPATIENT_CLINIC_OR_DEPARTMENT_OTHER)
Admission: EM | Admit: 2023-09-17 | Discharge: 2023-09-17 | Disposition: A | Discharge status: Home / Self Care | Payer: Medicare Other | Attending: Emergency Medicine | Admitting: Emergency Medicine

## 2023-09-17 DIAGNOSIS — M5134 Other intervertebral disc degeneration, thoracic region: Secondary | ICD-10-CM | POA: Diagnosis not present

## 2023-09-17 DIAGNOSIS — M1611 Unilateral primary osteoarthritis, right hip: Secondary | ICD-10-CM | POA: Diagnosis not present

## 2023-09-17 DIAGNOSIS — Z79899 Other long term (current) drug therapy: Secondary | ICD-10-CM | POA: Insufficient documentation

## 2023-09-17 DIAGNOSIS — M546 Pain in thoracic spine: Secondary | ICD-10-CM

## 2023-09-17 DIAGNOSIS — M549 Dorsalgia, unspecified: Secondary | ICD-10-CM | POA: Diagnosis not present

## 2023-09-17 DIAGNOSIS — I129 Hypertensive chronic kidney disease with stage 1 through stage 4 chronic kidney disease, or unspecified chronic kidney disease: Secondary | ICD-10-CM | POA: Insufficient documentation

## 2023-09-17 DIAGNOSIS — M109 Gout, unspecified: Secondary | ICD-10-CM | POA: Diagnosis not present

## 2023-09-17 DIAGNOSIS — M1A0410 Idiopathic chronic gout, right hand, without tophus (tophi): Secondary | ICD-10-CM

## 2023-09-17 DIAGNOSIS — Z853 Personal history of malignant neoplasm of breast: Secondary | ICD-10-CM | POA: Insufficient documentation

## 2023-09-17 DIAGNOSIS — N189 Chronic kidney disease, unspecified: Secondary | ICD-10-CM | POA: Diagnosis not present

## 2023-09-17 DIAGNOSIS — K8020 Calculus of gallbladder without cholecystitis without obstruction: Secondary | ICD-10-CM | POA: Diagnosis not present

## 2023-09-17 DIAGNOSIS — K5730 Diverticulosis of large intestine without perforation or abscess without bleeding: Secondary | ICD-10-CM | POA: Diagnosis not present

## 2023-09-17 DIAGNOSIS — M47814 Spondylosis without myelopathy or radiculopathy, thoracic region: Secondary | ICD-10-CM | POA: Diagnosis not present

## 2023-09-17 DIAGNOSIS — R109 Unspecified abdominal pain: Secondary | ICD-10-CM | POA: Diagnosis not present

## 2023-09-17 DIAGNOSIS — M25551 Pain in right hip: Secondary | ICD-10-CM | POA: Diagnosis not present

## 2023-09-17 LAB — CBC WITH DIFFERENTIAL/PLATELET
Abs Immature Granulocytes: 0.02 10*3/uL (ref 0.00–0.07)
Basophils Absolute: 0 10*3/uL (ref 0.0–0.1)
Basophils Relative: 0 %
Eosinophils Absolute: 0.1 10*3/uL (ref 0.0–0.5)
Eosinophils Relative: 1 %
HCT: 31.9 % — ABNORMAL LOW (ref 36.0–46.0)
Hemoglobin: 10.3 g/dL — ABNORMAL LOW (ref 12.0–15.0)
Immature Granulocytes: 0 %
Lymphocytes Relative: 19 %
Lymphs Abs: 1.4 10*3/uL (ref 0.7–4.0)
MCH: 25.2 pg — ABNORMAL LOW (ref 26.0–34.0)
MCHC: 32.3 g/dL (ref 30.0–36.0)
MCV: 78.2 fL — ABNORMAL LOW (ref 80.0–100.0)
Monocytes Absolute: 0.5 10*3/uL (ref 0.1–1.0)
Monocytes Relative: 7 %
Neutro Abs: 5.1 10*3/uL (ref 1.7–7.7)
Neutrophils Relative %: 73 %
Platelets: 232 10*3/uL (ref 150–400)
RBC: 4.08 MIL/uL (ref 3.87–5.11)
RDW: 16.8 % — ABNORMAL HIGH (ref 11.5–15.5)
WBC: 7.1 10*3/uL (ref 4.0–10.5)
nRBC: 0 % (ref 0.0–0.2)

## 2023-09-17 LAB — BASIC METABOLIC PANEL WITH GFR
Anion gap: 8 (ref 5–15)
BUN: 8 mg/dL (ref 8–23)
CO2: 25 mmol/L (ref 22–32)
Calcium: 10.4 mg/dL — ABNORMAL HIGH (ref 8.9–10.3)
Chloride: 108 mmol/L (ref 98–111)
Creatinine, Ser: 1.2 mg/dL — ABNORMAL HIGH (ref 0.44–1.00)
GFR, Estimated: 46 mL/min — ABNORMAL LOW
Glucose, Bld: 104 mg/dL — ABNORMAL HIGH (ref 70–99)
Potassium: 3.6 mmol/L (ref 3.5–5.1)
Sodium: 141 mmol/L (ref 135–145)

## 2023-09-17 MED ORDER — PREDNISONE 10 MG PO TABS: 40.0000 mg | ORAL_TABLET | Freq: Every day | ORAL | 0 refills | Status: AC

## 2023-09-17 MED ORDER — ACETAMINOPHEN 500 MG PO TABS
1000.0000 mg | ORAL_TABLET | Freq: Once | ORAL | Status: AC
  Administered 2023-09-17: 1000 mg via ORAL
  Filled 2023-09-17: qty 2

## 2023-09-17 MED ORDER — HYDROCODONE-ACETAMINOPHEN 5-325 MG PO TABS
1.0000 | ORAL_TABLET | Freq: Four times a day (QID) | ORAL | 0 refills | Status: DC | PRN
Start: 2023-09-17 — End: 2024-04-04

## 2023-09-17 MED ORDER — IOHEXOL 300 MG/ML  SOLN
100.0000 mL | Freq: Once | INTRAMUSCULAR | Status: AC | PRN
  Administered 2023-09-17: 100 mL via INTRAVENOUS

## 2023-09-17 NOTE — ED Triage Notes (Signed)
Pt arrived with c/o RLQ abdominal/hip pain  for about a month and back pain for several years. Hx of back surgeries. Pt also c/o hand pain/swelling for about 6 months. Seen PCP recently for hand.

## 2023-09-17 NOTE — ED Notes (Signed)
 Pt given discharge instructions and reviewed prescriptions. Opportunities given for questions. Pt verbalizes understanding. PIV removed x1. Jillyn Hidden, RN

## 2023-09-17 NOTE — ED Provider Notes (Signed)
[place] EMERGENCY DEPARTMENT AT [facility] Provider Note   CSN: [id number] Arrival date & time: 09/17/23  7829     History  Chief Complaint  Patient presents with   Abdominal Pain   Back Pain    [name] is a 78 y.o. female.   Abdominal Pain Associated symptoms: no nausea and no vomiting   Back Pain Associated symptoms: abdominal pain      78 year old female with medical history significant for HTN, HLD, diverticulosis, breast cancer, obesity, spinal stenosis with neurogenic claudication status post multiple previous lumbar fusions who presents to the emergency department with multiple chronic complaints.  The patient states that she has a history of treatment for gout by her PCP.  She has previously had swelling in her left wrist and hand which resolved and for the last month has had swelling and pain in her right wrist and hand.  Symptoms are worse with range of motion.  No falls or trauma to the hand.  She was treated twice with steroids without relief.  She has a history of CKD and does not take NSAIDs.  She sparingly takes opiates.  Additionally, she has had acute on chronic back pain with chronic low back pain since her previous lumbar fusions.  Symptoms are worse when she gets out of bed at night with pain from her low back radiating to her right hip.  Symptoms of ongoing for the last few months.  She initially endorses occasional sharp mid thoracic pain.  No chest pain, no shortness of breath.  She is moving her bowels with a bowel movement daily.  She is passing gas.  No urinary symptoms.  No fevers or chills.  No new falls or trauma, no new weakness in the lower extremities.  While resting in bed currently, she rates her pain as a 0.  It is worse at night when she is getting in and out of bed.  Home Medications Prior to Admission medications   Medication Sig Start Date End Date Taking? Authorizing Provider  HYDROcodone-acetaminophen (NORCO/VICODIN) 5-325  MG tablet Take 1-2 tablets by mouth every 6 (six) hours as needed. 09/17/23  Yes Ernie Avena, MD  predniSONE (DELTASONE) 10 MG tablet Take 4 tablets (40 mg total) by mouth daily for 5 days. 09/17/23 09/22/23 Yes Ernie Avena, MD  acetaminophen (TYLENOL) 500 MG tablet Take 1,000 mg by mouth 2 (two) times daily as needed for headache.    [provider]  atorvastatin (LIPITOR) 20 MG tablet TAKE 1 TABLET(20 MG) BY MOUTH AT BEDTIME 06/11/23   Tolia, Sunit, DO  blood glucose meter kit and supplies KIT Dispense based on patient and insurance preference. Use up to four times daily as directed. (FOR ICD-9 250.00, 250.01). 06/07/17   Demetrio Lapping, PA-C  clotrimazole-betamethasone (LOTRISONE) cream Apply 1 Application topically 2 (two) times daily.    [provider]  cycloSPORINE (RESTASIS) 0.05 % ophthalmic emulsion Place 1 drop into both eyes 2 (two) times daily.    [provider]  ergocalciferol (VITAMIN D2) 1.25 MG (50000 UT) capsule Take 50,000 Units by mouth once a week.    [provider]  escitalopram (LEXAPRO) 20 MG tablet Take 20 mg by mouth daily.     [provider]  ezetimibe (ZETIA) 10 MG tablet Take 10 mg by mouth at bedtime.  01/13/19   [provider]  fluticasone (CUTIVATE) 0.05 % cream Apply 1 application topically 2 (two) times daily. 11/25/18   [provider]  hydrochlorothiazide (HYDRODIURIL) 25 MG tablet Take 1 tablet (25 mg total) by mouth daily. 11/24/19   Alois Cliche, PA-C  hydrOXYzine (ATARAX) 10 MG tablet Take 25 mg by mouth daily. 07/25/21   [provider]  irbesartan (AVAPRO) 300 MG tablet Take 1 tablet (300 mg total) by mouth at bedtime. 08/23/17   Demetrio Lapping, PA-C  JARDIANCE 10 MG TABS tablet TAKE 1 TABLET(10 MG) BY MOUTH DAILY BEFORE BREAKFAST 12/13/22   Tolia, Sunit, DO  metFORMIN (GLUCOPHAGE) 500 MG tablet Take 1 tablet (500 mg total) by mouth daily with breakfast. 10/30/19   Alois Cliche, PA-C   Omega-3 Fatty Acids (OMEGA-3 FISH OIL PO) Take by mouth.    [provider]  verapamil (VERELAN PM) 240 MG 24 hr capsule Take 240 mg by mouth at bedtime.    [provider]      Allergies    Pregabalin, Cymbalta [duloxetine hcl], Gabapentin, Lipitor [atorvastatin], Lisinopril, Maxzide [hydrochlorothiazide w-triamterene], Niacin, Pravastatin sodium, Simvastatin, Triamterene, and Lodine [etodolac]    Review of Systems   Review of Systems  Gastrointestinal:  Positive for abdominal pain. Negative for nausea and vomiting.  Musculoskeletal:  Positive for arthralgias, back pain and joint swelling.  All other systems reviewed and are negative.   Physical Exam Updated Vital Signs BP (!) 141/71   Pulse 76   Temp 98.4 F (36.9 C) (Oral)   Resp 18   Ht 5\' 4"  (1.626 m)   Wt 124.7 kg   SpO2 95%   BMI 47.20 kg/m  Physical Exam Vitals and nursing note reviewed.  Constitutional:      General: She is not in acute distress.    Appearance: She is well-developed. She is obese.  HENT:     Head: Normocephalic and atraumatic.  Eyes:     Conjunctiva/sclera: Conjunctivae normal.  Cardiovascular:     Rate and Rhythm: Normal rate and regular rhythm.     Heart sounds: No murmur heard. Pulmonary:     Effort: Pulmonary effort is normal. No respiratory distress.     Breath sounds: Normal breath sounds.  Abdominal:     Palpations: Abdomen is soft.     Tenderness: There is abdominal tenderness in the right lower quadrant. There is no guarding or rebound.  Musculoskeletal:        General: No swelling.     Cervical back: Neck supple.     Comments: Mild midline tenderness of the thoracic spine, no midline tenderness of the lumbar spine, negative straight leg raise bilaterally.  Passive range of motion of the hips bilaterally without pain.  Right wrist with pain with range of motion, mild warmth compared to the left wrist, 2+ radial pulses, intact motor function along the median, ulnar,  radial nerve distributions, mild swelling and tenderness throughout the metacarpals and wrist, worse with any attempts at passive range of motion, no erythema.  Skin:    General: Skin is warm and dry.     Capillary Refill: Capillary refill takes less than 2 seconds.  Neurological:     Mental Status: She is alert.  Psychiatric:        Mood and Affect: Mood normal.     ED Results / Procedures / Treatments   Labs (all labs ordered are listed, but only abnormal results are displayed) Labs Reviewed  CBC WITH DIFFERENTIAL/PLATELET - Abnormal; Notable for the following components:      Result Value   Hemoglobin 10.3 (*)    HCT 31.9 (*)  MCV 78.2 (*)    MCH 25.2 (*)    RDW 16.8 (*)    All other components within normal limits  BASIC METABOLIC PANEL - Abnormal; Notable for the following components:   Glucose, Bld 104 (*)    Creatinine, Ser 1.20 (*)    Calcium 10.4 (*)    GFR, Estimated 46 (*)    All other components within normal limits    EKG None  Radiology DG Thoracic Spine 2 View Result Date: 09/17/2023 CLINICAL DATA:  Several year history of chronic back pain with six-month of hand pain and swelling. EXAM: THORACIC SPINE 2 VIEWS COMPARISON:  CT chest dated 06/21/2022 FINDINGS: There is no evidence of thoracic spine fracture. Alignment is normal. Multilevel degenerative changes of the thoracic spine characterized by anterior disc osteophyte formation. Intervertebral disc spaces are grossly maintained. Partially imaged excreted contrast material within the left renal collecting system. IMPRESSION: Multilevel degenerative changes of the thoracic spine. Electronically Signed   By: Agustin Cree M.D.   On: 09/17/2023 12:12   DG Hip Unilat W or Wo Pelvis 2-3 Views Right Result Date: 09/17/2023 CLINICAL DATA:  One-month history of right hip pain EXAM: DG HIP (WITH OR WITHOUT PELVIS) 3V RIGHT COMPARISON:  Pelvis and right hip radiograph dated 01/01/2023 FINDINGS: There is no evidence of hip  fracture or dislocation. Degenerative changes of the right hip. IMPRESSION: 1. No acute fracture or dislocation. 2. Degenerative changes of the right hip. Electronically Signed   By: Agustin Cree M.D.   On: 09/17/2023 11:41   CT ABDOMEN PELVIS W CONTRAST Result Date: 09/17/2023 CLINICAL DATA:  One-month history of right lower quadrant abdominal/hip pain. EXAM: CT ABDOMEN AND PELVIS WITH CONTRAST TECHNIQUE: Multidetector CT imaging of the abdomen and pelvis was performed using the standard protocol following bolus administration of intravenous contrast. RADIATION DOSE REDUCTION: This exam was performed according to the departmental dose-optimization program which includes automated exposure control, adjustment of the mA and/or kV according to patient size and/or use of iterative reconstruction technique. CONTRAST:  OMNIPAQUE IOHEXOL 300 MG/ML  SOLN COMPARISON:  CT abdomen and pelvis dated 04/21/2023 FINDINGS: Lower chest: No focal consolidation or pulmonary nodule in the lung bases. No pleural effusion or pneumothorax demonstrated. Partially imaged multichamber cardiomegaly. Hepatobiliary: No focal hepatic lesions. No intra or extrahepatic biliary ductal dilation. Cholelithiasis. Pancreas: No focal lesions or main ductal dilation. Spleen: Normal in size without focal abnormality. Adrenals/Urinary Tract: No adrenal nodules. No suspicious renal mass or hydronephrosis. Punctate nonobstructing right lower pole stone. No focal bladder wall thickening. Stomach/Bowel: Normal appearance of the stomach. No evidence of bowel wall thickening, distention, or inflammatory changes. Colonic diverticulosis without acute diverticulitis. Appendix is not discretely seen. Vascular/Lymphatic: Aortic atherosclerosis. No enlarged abdominal or pelvic lymph nodes. Reproductive: No adnexal masses. Ill-defined appearance of the endometrium, which appears thickened up to 1.9 cm. Other: No free fluid, fluid collection, or free air.  Musculoskeletal: No acute or abnormal lytic or blastic osseous lesions. Postsurgical changes of L3-L5 lumbar spinal fixation. Hardware appears intact. Multilevel degenerative changes of the partially imaged thoracic and lumbar spine. Unchanged grade 1 anterolisthesis at L5-S1. Laxity of the lower anterior abdominal wall. IMPRESSION: 1. No acute abdominopelvic findings. 2. Ill-defined appearance of the endometrium, which appears thickened up to 1.9 cm. Recommend further evaluation with nonemergent pelvic ultrasound examination. 3. Cholelithiasis. 4. Punctate nonobstructing right lower pole stone. 5. Colonic diverticulosis without acute diverticulitis. 6.  Aortic Atherosclerosis (ICD10-I70.0). Electronically Signed   By: Milus Height.D.  On: 09/17/2023 11:37    Procedures Procedures    Medications Ordered in ED Medications  acetaminophen (TYLENOL) tablet 1,000 mg (1,000 mg Oral Given 09/17/23 1028)  iohexol (OMNIPAQUE) 300 MG/ML solution 100 mL (100 mLs Intravenous Contrast Given 09/17/23 1042)    ED Course/ Medical Decision Making/ A&P                                 Medical Decision Making Amount and/or Complexity of Data Reviewed Labs: ordered. Radiology: ordered.  Risk OTC drugs. Prescription drug management.     78 year old female with medical history significant for HTN, HLD, diverticulosis, breast cancer, obesity, spinal stenosis with neurogenic claudication status post multiple previous lumbar fusions who presents to the emergency department with multiple chronic complaints.  The patient states that she has a history of treatment for gout by her PCP.  She has previously had swelling in her left wrist and hand which resolved and for the last month has had swelling and pain in her right wrist and hand.  Symptoms are worse with range of motion.  No falls or trauma to the hand.  She was treated twice with steroids without relief.  She has a history of CKD and does not take NSAIDs.  She  sparingly takes opiates.  Additionally, she has had acute on chronic back pain with chronic low back pain since her previous lumbar fusions.  Symptoms are worse when she gets out of bed at night with pain from her low back radiating to her right hip.  Symptoms of ongoing for the last few months.  She initially endorses occasional sharp mid thoracic pain.  No chest pain, no shortness of breath.  She is moving her bowels with a bowel movement daily.  She is passing gas.  No urinary symptoms.  No fevers or chills.  No new falls or trauma, no new weakness in the lower extremities.  While resting in bed currently, she rates her pain as a 0.  It is worse at night when she is getting in and out of bed.  On arrival, the patient was afebrile, not tachycardic or tachypneic, saturating well on room air, hemodynamically stable.  Physical exam with a normal neurologic exam, some tenderness to palpation of the midline of the thoracic spine.  Additionally with swelling and tenderness about the wrist.  Most consistent with gout flare given the patient's history.  Low concern for septic arthritis given the ongoing duration of symptoms over 1 month and previous history. Pt did have mild RLQ TTP on exam, will evaluate further with CT imaging.  CT Abdomen Pelvis: IMPRESSION:  1. No acute abdominopelvic findings.  2. Ill-defined appearance of the endometrium, which appears  thickened up to 1.9 cm. Recommend further evaluation with  nonemergent pelvic ultrasound examination.  3. Cholelithiasis.  4. Punctate nonobstructing right lower pole stone.  5. Colonic diverticulosis without acute diverticulitis.  6.  Aortic Atherosclerosis (ICD10-I70.0).   XR Right Hip: IMPRESSION:  1. No acute fracture or dislocation.  2. Degenerative changes of the right hip.    XR Thoracic Spine:  IMPRESSION:  Multilevel degenerative changes of the thoracic spine.    Labs: CBC without a leukocytosis, stable anemia to 10.3, BMP with  stable CKD with a creatinine of 1.20, mild hypercalcemia which appears to be chronic to 10.4.  Pt given Tylenol for pain control, feeling symptomatically improved.  Informed the patient of the results of her  of her CT imaging and need for follow-up outpatient.  Will treat for gout with a course of steroids as well as Norco for breakthrough pain.  Stable for discharge at this time with a plan for outpatient PCP follow-up.    Final Clinical Impression(s) / ED Diagnoses Final diagnoses:  Chronic gout of right hand, unspecified cause  Acute midline thoracic back pain  Osteoarthritis of right hip, unspecified osteoarthritis type  Hypercalcemia  DDD (degenerative disc disease), thoracic    Rx / DC Orders ED Discharge Orders          Ordered    predniSONE (DELTASONE) 10 MG tablet  Daily        09/17/23 1228    HYDROcodone-acetaminophen (NORCO/VICODIN) 5-325 MG tablet  Every 6 hours PRN        09/17/23 1228              Ernie Avena, MD 09/17/23 1232

## 2023-09-17 NOTE — Discharge Instructions (Addendum)
Your CT scan revealed a thickened endometrium, for which a non-emergent Pelvic US was recommended: IMPRESSION:  1. No acute abdominopelvic findings.  2. Ill-defined appearance of the endometrium, which appears  thickened up to 1.9 cm. Recommend further evaluation with  nonemergent pelvic ultrasound examination.  3. Cholelithiasis.  4. Punctate nonobstructing right lower pole stone.  5. Colonic diverticulosis without acute diverticulitis.  6.  Aortic Atherosclerosis (ICD10-I70.0).   Your XR imaging was reassuring.   Your hand swelling and pain is consistent with gout.  We will treat you for a gout flare with Opiates and Steroids. Your hip XR revealed osteoarthritis. Follow-up with your PCP for continued pain management and continued follow-up of your blood calcium levels which were mildly high today.

## 2023-09-25 ENCOUNTER — Other Ambulatory Visit: Payer: Self-pay | Admitting: Family Medicine

## 2023-09-25 DIAGNOSIS — R9389 Abnormal findings on diagnostic imaging of other specified body structures: Secondary | ICD-10-CM

## 2023-09-26 ENCOUNTER — Ambulatory Visit: Payer: Medicare Other | Attending: Cardiology | Admitting: Cardiology

## 2023-09-26 ENCOUNTER — Encounter: Payer: Self-pay | Admitting: Cardiology

## 2023-09-26 ENCOUNTER — Ambulatory Visit
Admission: RE | Admit: 2023-09-26 | Discharge: 2023-09-26 | Disposition: A | Discharge status: Home / Self Care | Payer: Medicare Other | Source: Ambulatory Visit | Attending: Family Medicine | Admitting: Family Medicine

## 2023-09-26 VITALS — BP 144/88 | HR 68 | Ht 64.0 in | Wt 279.0 lb

## 2023-09-26 DIAGNOSIS — E7800 Pure hypercholesterolemia, unspecified: Secondary | ICD-10-CM | POA: Diagnosis not present

## 2023-09-26 DIAGNOSIS — I10 Essential (primary) hypertension: Secondary | ICD-10-CM

## 2023-09-26 DIAGNOSIS — R0609 Other forms of dyspnea: Secondary | ICD-10-CM

## 2023-09-26 DIAGNOSIS — G4733 Obstructive sleep apnea (adult) (pediatric): Secondary | ICD-10-CM | POA: Diagnosis not present

## 2023-09-26 DIAGNOSIS — I700 Atherosclerosis of aorta: Secondary | ICD-10-CM

## 2023-09-26 DIAGNOSIS — E119 Type 2 diabetes mellitus without complications: Secondary | ICD-10-CM

## 2023-09-26 DIAGNOSIS — R9389 Abnormal findings on diagnostic imaging of other specified body structures: Secondary | ICD-10-CM | POA: Diagnosis not present

## 2023-09-26 DIAGNOSIS — I2510 Atherosclerotic heart disease of native coronary artery without angina pectoris: Secondary | ICD-10-CM

## 2023-09-26 MED ORDER — EMPAGLIFLOZIN 10 MG PO TABS: 10.0000 mg | ORAL_TABLET | Freq: Every day | ORAL | 3 refills | Status: DC

## 2023-09-26 NOTE — Patient Instructions (Addendum)
 Medication Instructions:  No changes *If you need a refill on your cardiac medications before your next appointment, please call your pharmacy*  Lab Work: No labs If you have labs (blood work) drawn today and your tests are completely normal, you will receive your results only by: MyChart Message (if you have MyChart) OR A paper copy in the mail If you have any lab test that is abnormal or we need to change your treatment, we will call you to review the results.  Testing/Procedures: No testing   Follow-Up: At [facility], you and your health needs are our priority.  As part of our continuing mission to provide you with exceptional heart care, we have created designated Provider Care Teams.  These Care Teams include your primary Cardiologist (physician) and Advanced Practice Providers (APPs -  Physician Assistants and Nurse Practitioners) who all work together to provide you with the care you need, when you need it.  We recommend signing up for the patient portal called "MyChart".  Sign up information is provided on this After Visit Summary.  MyChart is used to connect with patients for Virtual Visits (Telemedicine).  Patients are able to view lab/test results, encounter notes, upcoming appointments, etc.  Non-urgent messages can be sent to your provider as well.   To learn more about what you can do with MyChart, go to [email or web address].    Your next appointment:   1 year(s)  Provider:   Tessa Lerner, DO

## 2023-09-26 NOTE — Progress Notes (Signed)
 Cardiology Office Note:  .   Date:  09/26/2023  ID:  [name], DOB 1946-05-03, MRN [id number] PCP:  Noberto Retort, MD  Former Cardiology Providers: NA [facility] Providers Cardiologist:  Tessa Lerner, DO , [facility] (established care 07/18/2022 ) Electrophysiologist:  None  Click to update primary MD,subspecialty MD or APP then REFRESH:1}    Chief Complaint  Patient presents with   Coronary artery calcification   Follow-up    History of Present Illness: .   [name] is a 78 y.o. African-American female whose past medical history and cardiovascular risk factors includes: Coronary artery calcification (nongated CT study 05/2022), aortic atherosclerosis, hypertension, hyperlipidemia, non-insulin-dependent diabetes mellitus type 2, sleep apnea, iron deficiency anemia, obesity due to excess calories   Patient was referred to the practice given her coronary calcification, cardiomegaly and dilated pulmonary artery noted on nongated CT study.  Given her risk factors she did undergo ischemic workup as outlined below.  Nuclear stress test noted mild perfusion defect involving inferolateral segments.  She did undergo coronary CTA which noted mild CAC, aortic atherosclerosis, and mild nonobstructive disease.  She presents today for follow-up.  Over the last 1 year she denies any anginal chest pain or heart failure symptoms.  Her overall function capacity remains stable though limited secondary to back pain and prior surgeries.  Review of Systems: .   Review of Systems  Cardiovascular:  Positive for dyspnea on exertion (chronic and stable-improving). Negative for chest pain, claudication, irregular heartbeat, leg swelling, near-syncope, orthopnea, palpitations, paroxysmal nocturnal dyspnea and syncope.  Hematologic/Lymphatic: Negative for bleeding problem.  Musculoskeletal:  Positive for arthritis. Negative for muscle cramps and myalgias.  Neurological:  Negative for dizziness  and light-headedness.    Studies Reviewed:   EKG: EKG Interpretation Date/Time:  Wednesday September 26 2023 08:18:19 EST Ventricular Rate:  68 PR Interval:  166 QRS Duration:  94 QT Interval:  408 QTC Calculation: 433 R Axis:   19  Text Interpretation: Normal sinus rhythm Cannot rule out Anterior infarct , age undetermined When compared with ECG of 23-Dec-2014 15:08, No significant change was found Confirmed by Tessa Lerner [phone]) on 09/26/2023 8:22:38 AM  Echocardiogram: 07/27/2022:  Normal LV systolic function with visual EF 55-60%. Left ventricle cavity is normal in size. Severe concentric hypertrophy of the left ventricle. Normal global wall motion. Doppler evidence of grade I (impaired) diastolic dysfunction, normal LAP. Calculated EF 57%. Left atrial cavity is mildly dilated. Mild tricuspid regurgitation. No evidence of pulmonary hypertension. No prior available for comparison. Incidental finding of gallstones present, correlate clinically.   Coronary CTA 09/04/2022 Coronary artery calcification score: Left main: 0 Left anterior descending artery: 36 Left circumflex artery: 0 Right coronary artery: 0.  Total coronary calcium score of 36.9, places the patient at the 50th percentile for age and sex matched control.  2. Normal coronary origin with right dominance.  3. Aortic atherosclerosis.  4. CAD-RADS = 1. Left Main: Patent. LAD: Minimal stenosis (0-24%) due to calcified plaque in the mid LAD remainder of the vessel is patent. LCX: Patent. RCA: Patent.  Noncardiac findings: 1. No acute extracardiac findings. 2. Cardiomegaly.   RADIOLOGY: NA  Risk Assessment/Calculations:   NA   Labs:       Latest Ref Rng & Units 09/17/2023    9:38 AM 04/21/2023    2:16 PM 04/20/2020    6:33 AM  CBC  WBC 4.0 - 10.5 K/uL 7.1  7.4  10.9   Hemoglobin 12.0 - 15.0 g/dL 10.3  11.1  9.3   Hematocrit 36.0 - 46.0 % 31.9  34.0  29.6   Platelets 150 - 400 K/uL 232  269  212         Latest Ref Rng & Units 09/17/2023    9:38 AM 04/21/2023    2:16 PM 10/02/2022    8:32 AM  BMP  Glucose 70 - 99 mg/dL 440  92  347   BUN 8 - 23 mg/dL 8  18  15    Creatinine 0.44 - 1.00 mg/dL 4.25  9.56  3.87   BUN/Creat Ratio 12 - 28   9   Sodium 135 - 145 mmol/L 141  139  143   Potassium 3.5 - 5.1 mmol/L 3.6  3.8  4.3   Chloride 98 - 111 mmol/L 108  103  104   CO2 22 - 32 mmol/L 25  26  20    Calcium 8.9 - 10.3 mg/dL 56.4  33.2  95.1       Latest Ref Rng & Units 09/17/2023    9:38 AM 04/21/2023    2:16 PM 10/02/2022    8:32 AM  CMP  Glucose 70 - 99 mg/dL 884  92  166   BUN 8 - 23 mg/dL 8  18  15    Creatinine 0.44 - 1.00 mg/dL 0.63  0.16  0.10   Sodium 135 - 145 mmol/L 141  139  143   Potassium 3.5 - 5.1 mmol/L 3.6  3.8  4.3   Chloride 98 - 111 mmol/L 108  103  104   CO2 22 - 32 mmol/L 25  26  20    Calcium 8.9 - 10.3 mg/dL 93.2  35.5  73.2   Total Protein 6.5 - 8.1 g/dL  7.5  7.0   Total Bilirubin 0.3 - 1.2 mg/dL  0.6  0.3   Alkaline Phos 38 - 126 U/L  73  89   AST 15 - 41 U/L  12  16   ALT 0 - 44 U/L  10  13     Lab Results  Component Value Date   CHOL 152 10/02/2022   HDL 45 10/02/2022   LDLCALC 89 10/02/2022   LDLDIRECT 87 10/02/2022   TRIG 97 10/02/2022   CHOLHDL 6.4 11/11/2010   No results for input(s): "LIPOA" in the last 8760 hours. No components found for: "NTPROBNP" No results for input(s): "PROBNP" in the last 8760 hours. No results for input(s): "TSH" in the last 8760 hours.  External Labs: Collected: 08/04/2022 provided by PCP. Total cholesterol 228, triglycerides 174, HDL 42, calculated LDL 154, non-HDL 186  External Labs: Collected: 08/24/2023 [facility] database. Total cholesterol 159, triglycerides 134, HDL 35, LDL 100. A1c 6.5. TSH 1.36  Physical Exam:    Today's Vitals   09/26/23 0814  BP: (!) 144/88  Pulse: 68  SpO2: 99%  Weight: 279 lb (126.6 kg)  Height: 5\' 4"  (1.626 m)   Body mass index is 47.89 kg/m. Wt Readings from Last 3  Encounters:  09/26/23 279 lb (126.6 kg)  09/17/23 275 lb (124.7 kg)  08/08/23 279 lb 6.4 oz (126.7 kg)    Physical Exam  Constitutional: No distress.  Age appropriate, hemodynamically stable, ambulates w/ cane.   Neck: No JVD present.  Right sided neck pulsatility (chronic and stable).   Cardiovascular: Normal rate, regular rhythm, S1 normal, S2 normal, intact distal pulses and normal pulses. Exam reveals no gallop, no S3 and no S4.  No murmur heard. Pulmonary/Chest: Effort normal and  breath sounds normal. No stridor. She has no wheezes. She has no rales.  Abdominal: Soft. Bowel sounds are normal. She exhibits no distension. There is no abdominal tenderness.  Musculoskeletal:        General: No edema.     Cervical back: Neck supple.  Neurological: She is alert and oriented to person, place, and time. She has intact cranial nerves (2-12).  Skin: Skin is warm and moist.   Impression & Recommendation(s):  Impression:   ICD-10-CM   1. Coronary artery calcification  I25.10 EKG 12-Lead    empagliflozin (JARDIANCE) 10 MG TABS tablet    2. Dyspnea on exertion  R06.09     3. Atherosclerosis of aorta (HCC)  I70.0 empagliflozin (JARDIANCE) 10 MG TABS tablet    4. Essential hypertension  I10     5. OSA (obstructive sleep apnea)  G47.33     6. Pure hypercholesterolemia  E78.00     7. Non-insulin dependent type 2 diabetes mellitus (HCC)  E11.9 empagliflozin (JARDIANCE) 10 MG TABS tablet       Recommendation(s):  Coronary artery calcification Atherosclerosis of aorta (HCC) Dyspnea on exertion Total CAC 36, placing her at the 50th percentile. CAD RADS 1 per most recent coronary CTA. No chest pain since last office visit. Lipids have improved compared to prior levels. Continue Lipitor and Zetia. EKG today illustrates sinus rhythm without underlying ischemia injury pattern. No additional workup warranted at this time.  Essential hypertension Office blood pressures are  acceptable. Continue hydrochlorothiazide 25 mg p.o. daily. Continue Avapro 300 mg p.o. every afternoon. Continue verapamil 120 mg p.o. every afternoon. Patient requesting refill on Jardiance 10 mg p.o. every morning  OSA (obstructive sleep apnea) She endorses compliance with her CPAP.  Pure hypercholesterolemia Currently on Lipitor 20 mg p.o. nightly. Continue Zetia 10 mg daily. Outside labs independently reviewed dated 08/24/2023.  LDL 100 mg/dL improved compared to prior levels  Non-insulin dependent type 2 diabetes mellitus (HCC) Most recent hemoglobin A1c 6.5 as of January 2025. Currently on medical therapy. Refill Jardiance. Reemphasized importance of glycemic control.  Of Note, recommended that she get an ultrasound of the thyroid if unremarkable then we will proceed forward with ultrasound of the carotid duplex bilateral.  She will reach back out after the carotid thyroid ultrasound is performed-she will speak to her PCP first.  Orders Placed:  Orders Placed This Encounter  Procedures   EKG 12-Lead    Final Medication List:    Meds ordered this encounter  Medications   empagliflozin (JARDIANCE) 10 MG TABS tablet    Sig: Take 1 tablet (10 mg total) by mouth daily.    Dispense:  90 tablet    Refill:  3    Medications Discontinued During This Encounter  Medication Reason   JARDIANCE 10 MG TABS tablet Reorder     Current Outpatient Medications:    acetaminophen (TYLENOL) 500 MG tablet, Take 1,000 mg by mouth 2 (two) times daily as needed for headache., Disp: , Rfl:    atorvastatin (LIPITOR) 20 MG tablet, TAKE 1 TABLET(20 MG) BY MOUTH AT BEDTIME, Disp: 90 tablet, Rfl: 1   blood glucose meter kit and supplies KIT, Dispense based on patient and insurance preference. Use up to four times daily as directed. (FOR ICD-9 250.00, 250.01)., Disp: 1 each, Rfl: 0   clotrimazole-betamethasone (LOTRISONE) cream, Apply 1 Application topically 2 (two) times daily., Disp: , Rfl:     cycloSPORINE (RESTASIS) 0.05 % ophthalmic emulsion, Place 1 drop into both eyes 2 (  two) times daily., Disp: , Rfl:    ergocalciferol (VITAMIN D2) 1.25 MG (50000 UT) capsule, Take 50,000 Units by mouth once a week., Disp: , Rfl:    escitalopram (LEXAPRO) 20 MG tablet, Take 20 mg by mouth daily. , Disp: , Rfl:    ezetimibe (ZETIA) 10 MG tablet, Take 10 mg by mouth at bedtime. , Disp: , Rfl:    fluticasone (CUTIVATE) 0.05 % cream, Apply 1 application topically 2 (two) times daily., Disp: , Rfl:    hydrochlorothiazide (HYDRODIURIL) 25 MG tablet, Take 1 tablet (25 mg total) by mouth daily., Disp: 30 tablet, Rfl: 0   HYDROcodone-acetaminophen (NORCO/VICODIN) 5-325 MG tablet, Take 1-2 tablets by mouth every 6 (six) hours as needed., Disp: 10 tablet, Rfl: 0   hydrOXYzine (ATARAX) 10 MG tablet, Take 25 mg by mouth daily., Disp: , Rfl:    irbesartan (AVAPRO) 300 MG tablet, Take 1 tablet (300 mg total) by mouth at bedtime., Disp: 30 tablet, Rfl: 0   metFORMIN (GLUCOPHAGE) 500 MG tablet, Take 1 tablet (500 mg total) by mouth daily with breakfast., Disp: 90 tablet, Rfl: 0   Omega-3 Fatty Acids (OMEGA-3 FISH OIL PO), Take by mouth., Disp: , Rfl:    verapamil (VERELAN PM) 240 MG 24 hr capsule, Take 240 mg by mouth at bedtime., Disp: , Rfl:    empagliflozin (JARDIANCE) 10 MG TABS tablet, Take 1 tablet (10 mg total) by mouth daily., Disp: 90 tablet, Rfl: 3  Consent:   NA  Disposition:    1 year follow-up  Her questions and concerns were addressed to her satisfaction. She voices understanding of the recommendations provided during this encounter.    Signed, Tessa Lerner, DO, [facility]  [facility]  [address] [place], [place] [address] 09/26/2023 8:53 AM

## 2023-09-27 ENCOUNTER — Other Ambulatory Visit: Payer: Self-pay | Admitting: Family Medicine

## 2023-09-27 DIAGNOSIS — Z1231 Encounter for screening mammogram for malignant neoplasm of breast: Secondary | ICD-10-CM

## 2023-10-02 ENCOUNTER — Telehealth: Payer: Self-pay | Admitting: Cardiology

## 2023-10-02 NOTE — Telephone Encounter (Signed)
 Pt c/o medication issue:  1. Name of Medication: Jardiance  2. How are you currently taking this medication (dosage and times per day)?   3. Are you having a reaction (difficulty breathing--STAT)?   4. What is your medication issue? Patient says she can not afford this medicine. She would like something else to take please.

## 2023-10-03 NOTE — Telephone Encounter (Signed)
 Please if there is patient assistance for Jardiance.   [name] Bermingham Prichard, DO, [facility]

## 2023-10-05 NOTE — Telephone Encounter (Signed)
 Spoke with pt and discussed the option for patient assistance with Jardiance. Pt verbalized that she would like the forms to be mailed to her. Forms has been placed in the outgoing mailbox.

## 2023-10-08 DIAGNOSIS — H524 Presbyopia: Secondary | ICD-10-CM | POA: Diagnosis not present

## 2023-10-08 DIAGNOSIS — E119 Type 2 diabetes mellitus without complications: Secondary | ICD-10-CM | POA: Diagnosis not present

## 2023-10-08 DIAGNOSIS — H353132 Nonexudative age-related macular degeneration, bilateral, intermediate dry stage: Secondary | ICD-10-CM | POA: Diagnosis not present

## 2023-10-08 DIAGNOSIS — H40023 Open angle with borderline findings, high risk, bilateral: Secondary | ICD-10-CM | POA: Diagnosis not present

## 2023-10-08 DIAGNOSIS — H04123 Dry eye syndrome of bilateral lacrimal glands: Secondary | ICD-10-CM | POA: Diagnosis not present

## 2023-10-09 DIAGNOSIS — S32010G Wedge compression fracture of first lumbar vertebra, subsequent encounter for fracture with delayed healing: Secondary | ICD-10-CM | POA: Diagnosis not present

## 2023-10-22 DIAGNOSIS — M25551 Pain in right hip: Secondary | ICD-10-CM | POA: Diagnosis not present

## 2023-10-26 ENCOUNTER — Ambulatory Visit: Payer: Medicare Other

## 2023-10-26 ENCOUNTER — Ambulatory Visit
Admission: RE | Admit: 2023-10-26 | Discharge: 2023-10-26 | Disposition: A | Discharge status: Home / Self Care | Source: Ambulatory Visit | Attending: Family Medicine | Admitting: Family Medicine

## 2023-10-26 DIAGNOSIS — Z1231 Encounter for screening mammogram for malignant neoplasm of breast: Secondary | ICD-10-CM

## 2023-10-27 ENCOUNTER — Emergency Department (HOSPITAL_COMMUNITY)

## 2023-10-27 ENCOUNTER — Encounter (HOSPITAL_COMMUNITY): Payer: Self-pay

## 2023-10-27 ENCOUNTER — Emergency Department (HOSPITAL_COMMUNITY)
Admission: EM | Admit: 2023-10-27 | Discharge: 2023-10-27 | Disposition: A | Discharge status: Home / Self Care | Attending: Emergency Medicine | Admitting: Emergency Medicine

## 2023-10-27 ENCOUNTER — Other Ambulatory Visit: Payer: Self-pay

## 2023-10-27 DIAGNOSIS — R519 Headache, unspecified: Secondary | ICD-10-CM | POA: Insufficient documentation

## 2023-10-27 DIAGNOSIS — Z79899 Other long term (current) drug therapy: Secondary | ICD-10-CM | POA: Insufficient documentation

## 2023-10-27 DIAGNOSIS — I10 Essential (primary) hypertension: Secondary | ICD-10-CM | POA: Insufficient documentation

## 2023-10-27 LAB — CBC
HCT: 35.5 % — ABNORMAL LOW (ref 36.0–46.0)
Hemoglobin: 11.2 g/dL — ABNORMAL LOW (ref 12.0–15.0)
MCH: 25.1 pg — ABNORMAL LOW (ref 26.0–34.0)
MCHC: 31.5 g/dL (ref 30.0–36.0)
MCV: 79.4 fL — ABNORMAL LOW (ref 80.0–100.0)
Platelets: 309 10*3/uL (ref 150–400)
RBC: 4.47 MIL/uL (ref 3.87–5.11)
RDW: 17.3 % — ABNORMAL HIGH (ref 11.5–15.5)
WBC: 8.1 10*3/uL (ref 4.0–10.5)
nRBC: 0 % (ref 0.0–0.2)

## 2023-10-27 LAB — COMPREHENSIVE METABOLIC PANEL WITH GFR
ALT: 14 U/L (ref 0–44)
AST: 22 U/L (ref 15–41)
Albumin: 3.7 g/dL (ref 3.5–5.0)
Alkaline Phosphatase: 78 U/L (ref 38–126)
Anion gap: 7 (ref 5–15)
BUN: 13 mg/dL (ref 8–23)
CO2: 24 mmol/L (ref 22–32)
Calcium: 10.2 mg/dL (ref 8.9–10.3)
Chloride: 105 mmol/L (ref 98–111)
Creatinine, Ser: 1.3 mg/dL — ABNORMAL HIGH (ref 0.44–1.00)
GFR, Estimated: 42 mL/min — ABNORMAL LOW (ref >60)
Glucose, Bld: 94 mg/dL (ref 70–99)
Potassium: 4.2 mmol/L (ref 3.5–5.1)
Sodium: 136 mmol/L (ref 135–145)
Total Bilirubin: 0.6 mg/dL (ref 0.0–1.2)
Total Protein: 7.5 g/dL (ref 6.5–8.1)

## 2023-10-27 MED ORDER — METOCLOPRAMIDE HCL 10 MG PO TABS
10.0000 mg | ORAL_TABLET | Freq: Three times a day (TID) | ORAL | 0 refills | Status: DC | PRN
Start: 2023-10-27 — End: 2024-03-07

## 2023-10-27 MED ORDER — IOHEXOL 350 MG/ML SOLN
75.0000 mL | Freq: Once | INTRAVENOUS | Status: AC | PRN
  Administered 2023-10-27: 75 mL via INTRAVENOUS

## 2023-10-27 MED ORDER — METOCLOPRAMIDE HCL 5 MG/ML IJ SOLN
10.0000 mg | Freq: Once | INTRAMUSCULAR | Status: AC
  Administered 2023-10-27: 10 mg via INTRAVENOUS
  Filled 2023-10-27: qty 2

## 2023-10-27 MED ORDER — KETOROLAC TROMETHAMINE 15 MG/ML IJ SOLN
15.0000 mg | Freq: Once | INTRAMUSCULAR | Status: AC
  Administered 2023-10-27: 15 mg via INTRAVENOUS
  Filled 2023-10-27: qty 1

## 2023-10-27 NOTE — ED Provider Notes (Addendum)
 [place] EMERGENCY DEPARTMENT AT [facility] Provider Note   CSN: [id number] Arrival date & time: 10/27/23  1719     History  Chief Complaint  Patient presents with   Headache    [name] is a 78 y.o. female presented to ED complaining of headaches ongoing for approximately 2 weeks.  Patient reports she has had a headache mostly behind her right eye and sometimes to her right temple, sometimes radiate to the right side of her head, for 2 weeks.  It is specifically worse when she is trying to lie down at night and sleep, she wakes up with a headache each morning.  It tends to fluctuate throughout the daytime and get better.  She has been taking Tylenol for pain.  Little relief.  Denies prior history of migraines or headaches.  She does report a fall about a month ago and struck her head, but did not have a headache at that time.  She is not on blood thinners.  Denies history of DVT or PE.  She is here with her granddaughter at the bedside  HPI     Home Medications Prior to Admission medications   Medication Sig Start Date End Date Taking? Authorizing Provider  metoCLOPramide (REGLAN) 10 MG tablet Take 1 tablet (10 mg total) by mouth 3 (three) times daily as needed for up to 15 doses for nausea. 10/27/23  Yes Vidya Bamford, Kermit Balo, MD  acetaminophen (TYLENOL) 500 MG tablet Take 1,000 mg by mouth 2 (two) times daily as needed for headache.    [provider]  atorvastatin (LIPITOR) 20 MG tablet TAKE 1 TABLET(20 MG) BY MOUTH AT BEDTIME 06/11/23   Tolia, Sunit, DO  blood glucose meter kit and supplies KIT Dispense based on patient and insurance preference. Use up to four times daily as directed. (FOR ICD-9 250.00, 250.01). 06/07/17   Demetrio Lapping, PA-C  clotrimazole-betamethasone (LOTRISONE) cream Apply 1 Application topically 2 (two) times daily.    [provider]  cycloSPORINE (RESTASIS) 0.05 % ophthalmic emulsion Place 1 drop into both eyes 2 (two) times  daily.    [provider]  empagliflozin (JARDIANCE) 10 MG TABS tablet Take 1 tablet (10 mg total) by mouth daily. 09/26/23   Tolia, Sunit, DO  ergocalciferol (VITAMIN D2) 1.25 MG (50000 UT) capsule Take 50,000 Units by mouth once a week.    [provider]  escitalopram (LEXAPRO) 20 MG tablet Take 20 mg by mouth daily.     [provider]  ezetimibe (ZETIA) 10 MG tablet Take 10 mg by mouth at bedtime.  01/13/19   [provider]  fluticasone (CUTIVATE) 0.05 % cream Apply 1 application topically 2 (two) times daily. 11/25/18   [provider]  hydrochlorothiazide (HYDRODIURIL) 25 MG tablet Take 1 tablet (25 mg total) by mouth daily. 11/24/19   Alois Cliche, PA-C  HYDROcodone-acetaminophen (NORCO/VICODIN) 5-325 MG tablet Take 1-2 tablets by mouth every 6 (six) hours as needed. 09/17/23   Ernie Avena, MD  hydrOXYzine (ATARAX) 10 MG tablet Take 25 mg by mouth daily. 07/25/21   [provider]  irbesartan (AVAPRO) 300 MG tablet Take 1 tablet (300 mg total) by mouth at bedtime. 08/23/17   Demetrio Lapping, PA-C  metFORMIN (GLUCOPHAGE) 500 MG tablet Take 1 tablet (500 mg total) by mouth daily with breakfast. 10/30/19   Alois Cliche, PA-C  Omega-3 Fatty Acids (OMEGA-3 FISH OIL PO) Take by mouth.    [provider]  verapamil (VERELAN PM)  240 MG 24 hr capsule Take 240 mg by mouth at bedtime.    [provider]      Allergies    Pregabalin, Cymbalta [duloxetine hcl], Gabapentin, Lipitor [atorvastatin], Lisinopril, Maxzide [hydrochlorothiazide w-triamterene], Niacin, Pravastatin sodium, Simvastatin, Triamterene, and Lodine [etodolac]    Review of Systems   Review of Systems  Physical Exam Updated Vital Signs BP (!) 153/77   Pulse 67   Temp 98.2 F (36.8 C) (Oral)   Resp 18   Ht 5\' 4"  (1.626 m)   Wt 126.6 kg   SpO2 98%   BMI 47.89 kg/m  Physical Exam Constitutional:      General: She is not in acute distress. HENT:      Head: Normocephalic and atraumatic.     Comments: R Maxillary sinus ttp Eyes:     Conjunctiva/sclera: Conjunctivae normal.     Pupils: Pupils are equal, round, and reactive to light.  Cardiovascular:     Rate and Rhythm: Normal rate and regular rhythm.  Pulmonary:     Effort: Pulmonary effort is normal. No respiratory distress.  Abdominal:     General: There is no distension.     Tenderness: There is no abdominal tenderness.  Skin:    General: Skin is warm and dry.  Neurological:     General: No focal deficit present.     Mental Status: She is alert. Mental status is at baseline.     Comments: No temporal artery tenderness of the right side, extraocular motions intact, no cranial nerve palsies, speech is clear  Psychiatric:        Mood and Affect: Mood normal.        Behavior: Behavior normal.     ED Results / Procedures / Treatments   Labs (all labs ordered are listed, but only abnormal results are displayed) Labs Reviewed  COMPREHENSIVE METABOLIC PANEL WITH GFR - Abnormal; Notable for the following components:      Result Value   Creatinine, Ser 1.30 (*)    GFR, Estimated 42 (*)    All other components within normal limits  CBC - Abnormal; Notable for the following components:   Hemoglobin 11.2 (*)    HCT 35.5 (*)    MCV 79.4 (*)    MCH 25.1 (*)    RDW 17.3 (*)    All other components within normal limits    EKG None  Radiology CT VENOGRAM HEAD Result Date: 10/27/2023 CLINICAL DATA:  Dural venous sinus thrombosis suspected right temporal headache EXAM: CT VENOGRAM HEAD TECHNIQUE: Venographic phase images of the brain were obtained following the administration of intravenous contrast. Multiplanar reformats and maximum intensity projections were generated. RADIATION DOSE REDUCTION: This exam was performed according to the departmental dose-optimization program which includes automated exposure control, adjustment of the mA and/or kV according to patient size and/or use  of iterative reconstruction technique. CONTRAST:  75mL OMNIPAQUE IOHEXOL 350 MG/ML SOLN COMPARISON:  CT head November 09, 2010. FINDINGS: No evidence of dural venous sinus thrombosis. Specifically, the superior sagittal, straight, sigmoid, transverse sinus are patent. Rounded filling defect in the distal right transverse sinus is likely an arachnoid granulation. Narrowing of the left transverse sinus. No evidence of acute large vascular territory infarct, acute hemorrhage, mass lesion or midline shift. Partially empty and expanded sella. IMPRESSION: 1. No evidence of dural venous sinus thrombosis or acute intracranial abnormality. 2. Partially empty sella and small left transverse sinus. Findings are often a normal anatomic variant but can be associated with idiopathic  intracranial hypertension. Electronically Signed   By: Feliberto Harts M.D.   On: 10/27/2023 20:29    Procedures Procedures    Medications Ordered in ED Medications  ketorolac (TORADOL) 15 MG/ML injection 15 mg (15 mg Intravenous Given 10/27/23 1837)  metoCLOPramide (REGLAN) injection 10 mg (10 mg Intravenous Given 10/27/23 1837)  iohexol (OMNIPAQUE) 350 MG/ML injection 75 mL (75 mLs Intravenous Contrast Given 10/27/23 1911)    ED Course/ Medical Decision Making/ A&P Clinical Course as of 10/27/23 2103  Sat Oct 27, 2023  1833 BP 155/80 mmhg at this time [MT]    Clinical Course User Index [MT] Terald Sleeper, MD                                 Medical Decision Making Amount and/or Complexity of Data Reviewed Labs: ordered. Radiology: ordered.  Risk Prescription drug management.   This patient presents to the Emergency Department with complaint of headache.  This involves an extensive number of treatment options, and is a complaint that carries with it a high risk of complications and morbidity.  The differential diagnosis for headache includes tension type headache vs occipital headache vs migraine vs sinusitis vs  intracranial bleed vs venous thrombosis vs other  I ordered, reviewed, and interpreted labs, notable for no emergent findings I ordered medication for headache and/or nausea I ordered imaging studies which included CT venogram  I independently visualized and interpreted imaging which showed no emergent findings, possible empty saddle presentation   After the interventions stated above, I reevaluated the patient and found that the patient remained clinically stable.  Based on the patient's clinical exam, vital signs, risk factors, and ED testing, I felt that the patient's overall risk of life-threatening emergency such as ICH, meningitis, intracranial mass or tumor was quite low.  She has no temporal artery tenderness on exam or loss of vision to raise concern for giant cell arteritis  I suspect this clinical presentation is most consistent with nonspecific headache, but explained to the patient that this evaluation was not a definitive diagnostic workup.  I discussed outpatient follow up with primary care provider, and provided specialist office number on the patient's discharge paper if a referral was deemed necessary.  I discussed return precautions with the patient. I felt the patient was clinically stable for discharge.  *  Patient will be referred to neurology for further potential workup for pseudotumor.  At this point I do not see an indication for emergent lumbar puncture.  She is asymptomatic.  I did provide strict return precautions with particular attention to visual changes which she verbalized understanding of.  Blood pressure improved in the ED.  Stable for discharge     Final Clinical Impression(s) / ED Diagnoses Final diagnoses:  Nonintractable headache, unspecified chronicity pattern, unspecified headache type    Rx / DC Orders ED Discharge Orders          Ordered    metoCLOPramide (REGLAN) 10 MG tablet  3 times daily PRN        10/27/23 2103               Terald Sleeper, MD 10/27/23 2104    Terald Sleeper, MD 10/27/23 2249

## 2023-10-27 NOTE — ED Triage Notes (Signed)
 Pt reports headache x2 weeks. No injury reported. No thinners. H/x HTN.

## 2023-10-27 NOTE — Discharge Instructions (Addendum)
 I recommend that you follow-up with the neurologist for further workup of your headaches.  It is possible that you are experiencing the condition called intracranial hypertension.  I gave you information to read about this issue. Please be aware that this condition was not specifically diagnosed in the ER.  You will need to see a specialist if your headaches persist.  You can use the Reglan medicine as needed if your headache returns.  If you do begin having a bad headache, especially with stroke symptoms, or blurred vision, you need to return immediately to the hospital.

## 2023-11-01 DIAGNOSIS — I10 Essential (primary) hypertension: Secondary | ICD-10-CM | POA: Diagnosis not present

## 2023-11-01 DIAGNOSIS — G4489 Other headache syndrome: Secondary | ICD-10-CM | POA: Diagnosis not present

## 2023-11-05 ENCOUNTER — Other Ambulatory Visit: Payer: Self-pay | Admitting: Cardiology

## 2023-11-05 DIAGNOSIS — I2510 Atherosclerotic heart disease of native coronary artery without angina pectoris: Secondary | ICD-10-CM

## 2023-11-05 DIAGNOSIS — E119 Type 2 diabetes mellitus without complications: Secondary | ICD-10-CM

## 2023-11-05 DIAGNOSIS — I700 Atherosclerosis of aorta: Secondary | ICD-10-CM

## 2023-11-07 ENCOUNTER — Encounter: Payer: Self-pay | Admitting: Neurology

## 2023-11-09 DIAGNOSIS — R9389 Abnormal findings on diagnostic imaging of other specified body structures: Secondary | ICD-10-CM | POA: Diagnosis not present

## 2023-11-09 DIAGNOSIS — B354 Tinea corporis: Secondary | ICD-10-CM | POA: Diagnosis not present

## 2024-01-21 DIAGNOSIS — M19072 Primary osteoarthritis, left ankle and foot: Secondary | ICD-10-CM | POA: Diagnosis not present

## 2024-01-21 DIAGNOSIS — M19071 Primary osteoarthritis, right ankle and foot: Secondary | ICD-10-CM | POA: Diagnosis not present

## 2024-01-21 DIAGNOSIS — B351 Tinea unguium: Secondary | ICD-10-CM | POA: Diagnosis not present

## 2024-01-21 DIAGNOSIS — M2142 Flat foot [pes planus] (acquired), left foot: Secondary | ICD-10-CM | POA: Diagnosis not present

## 2024-01-21 DIAGNOSIS — I739 Peripheral vascular disease, unspecified: Secondary | ICD-10-CM | POA: Diagnosis not present

## 2024-01-21 DIAGNOSIS — M2141 Flat foot [pes planus] (acquired), right foot: Secondary | ICD-10-CM | POA: Diagnosis not present

## 2024-01-21 DIAGNOSIS — E1151 Type 2 diabetes mellitus with diabetic peripheral angiopathy without gangrene: Secondary | ICD-10-CM | POA: Diagnosis not present

## 2024-01-21 DIAGNOSIS — L603 Nail dystrophy: Secondary | ICD-10-CM | POA: Diagnosis not present

## 2024-02-20 DIAGNOSIS — M545 Low back pain, unspecified: Secondary | ICD-10-CM | POA: Diagnosis not present

## 2024-02-20 DIAGNOSIS — R109 Unspecified abdominal pain: Secondary | ICD-10-CM | POA: Diagnosis not present

## 2024-02-20 DIAGNOSIS — I10 Essential (primary) hypertension: Secondary | ICD-10-CM | POA: Diagnosis not present

## 2024-02-20 DIAGNOSIS — E1142 Type 2 diabetes mellitus with diabetic polyneuropathy: Secondary | ICD-10-CM | POA: Diagnosis not present

## 2024-02-20 DIAGNOSIS — D509 Iron deficiency anemia, unspecified: Secondary | ICD-10-CM | POA: Diagnosis not present

## 2024-02-20 DIAGNOSIS — Z8739 Personal history of other diseases of the musculoskeletal system and connective tissue: Secondary | ICD-10-CM | POA: Diagnosis not present

## 2024-02-20 DIAGNOSIS — K5900 Constipation, unspecified: Secondary | ICD-10-CM

## 2024-02-20 DIAGNOSIS — Z0000 Encounter for general adult medical examination without abnormal findings: Secondary | ICD-10-CM | POA: Diagnosis not present

## 2024-02-20 DIAGNOSIS — N183 Chronic kidney disease, stage 3 unspecified: Secondary | ICD-10-CM | POA: Diagnosis not present

## 2024-02-20 DIAGNOSIS — E7800 Pure hypercholesterolemia, unspecified: Secondary | ICD-10-CM | POA: Diagnosis not present

## 2024-02-20 HISTORY — DX: Constipation, unspecified: K59.00

## 2024-02-21 ENCOUNTER — Encounter (HOSPITAL_COMMUNITY): Payer: Self-pay | Admitting: Obstetrics and Gynecology

## 2024-02-21 NOTE — Progress Notes (Addendum)
 Addendum:  Patient just called and stated she is bleeding really bad and wants to know if she should still come to to surgery or go to emergency room.  Clarified with patient where she is bleeding patient stated from vagina and it is just coming out she is having to go to the bathroom a lot.  I asked how many pads is she using , stated she does not have pads , she is using tissue.  Advised patient she needs to call surgeon office and ask for triage nurse so they can get in tough with her surgeon,  number given.  Pt verbalized understanding and will call. Called pre-op and spoke w/ Catie RN let her know what is going with this patient.  Spoke w/ via phone for pre-op interview--- pt Lab needs dos----   cbc/ bmp/ a1c  (per anes)  Lab results------ current EKG in epic/ chart COVID test -----patient states asymptomatic no test needed Arrive at ------- 1130 on 02-28-2024 NPO after MN NO Solid Food.  Clear liquids from MN until--- 1030 Pre-Surgery Ensure or G2:  Med rec completed Medications to take morning of surgery ----- hydralazine, lexapro , lipitor Diabetic medication ----- do not take metformin   GLP1 agonist last dose: n/a GLP1 instructions:  Patient instructed no nail polish to be worn day of surgery Patient instructed to bring photo id and insurance card day of surgery Patient aware to have Driver (ride ) / caregiver    for 24 hours after surgery - husband, david Dimalanta Patient Special Instructions ----- n/a Pre-Op special Instructions ----- sent inbox message in epic to Dr MARLA. Ross on 02-20-2024, requested pre-op orders  Patient verbalized understanding of instructions that were given at this phone interview. Patient denies chest pain, sob, fever, cough at the interview.    Anesthesia Review:  PCP:  Dr MICAEL Lesches (per pt lov 06/ 2025_ Cardiologist : Dr GORMAN. Tolia graciela 09-26-2023)  Chest x-ray : 06-08-2022 EKG : 09-25-2021 Echo : 07-27-2022 Stress test: NUC 08-14-2022 Cardiac  Cath :  no CCTA:  09-04-2022  Activity level:  SOB w/ stairs and long distance, okay w/ household chores Sleep Study/ CPAP :  yes (08-05-2004)/  pt stated uses nightly Fasting Blood Sugar: / Checks Blood Sugar -- times a day:  checks 2 times monthly , not fasting  Blood Thinner/ Instructions /Last Dose:  no ASA / Instructions/ Last Dose : no

## 2024-02-27 ENCOUNTER — Other Ambulatory Visit: Payer: Self-pay | Admitting: Obstetrics and Gynecology

## 2024-02-27 DIAGNOSIS — R9389 Abnormal findings on diagnostic imaging of other specified body structures: Secondary | ICD-10-CM

## 2024-02-28 ENCOUNTER — Ambulatory Visit (HOSPITAL_COMMUNITY): Admitting: Anesthesiology

## 2024-02-28 ENCOUNTER — Other Ambulatory Visit: Payer: Self-pay

## 2024-02-28 ENCOUNTER — Ambulatory Visit (HOSPITAL_COMMUNITY)
Admission: RE | Admit: 2024-02-28 | Discharge: 2024-02-28 | Disposition: A | Discharge status: Home / Self Care | Attending: Obstetrics and Gynecology | Admitting: Obstetrics and Gynecology

## 2024-02-28 ENCOUNTER — Encounter (HOSPITAL_COMMUNITY)
Admission: RE | Disposition: A | Discharge status: Home / Self Care | Payer: Self-pay | Source: Home / Self Care | Attending: Obstetrics and Gynecology

## 2024-02-28 ENCOUNTER — Encounter (HOSPITAL_COMMUNITY): Payer: Self-pay | Admitting: Obstetrics and Gynecology

## 2024-02-28 DIAGNOSIS — Z7984 Long term (current) use of oral hypoglycemic drugs: Secondary | ICD-10-CM | POA: Insufficient documentation

## 2024-02-28 DIAGNOSIS — I2510 Atherosclerotic heart disease of native coronary artery without angina pectoris: Secondary | ICD-10-CM | POA: Insufficient documentation

## 2024-02-28 DIAGNOSIS — Z8349 Family history of other endocrine, nutritional and metabolic diseases: Secondary | ICD-10-CM | POA: Diagnosis not present

## 2024-02-28 DIAGNOSIS — E785 Hyperlipidemia, unspecified: Secondary | ICD-10-CM | POA: Diagnosis not present

## 2024-02-28 DIAGNOSIS — M1990 Unspecified osteoarthritis, unspecified site: Secondary | ICD-10-CM | POA: Diagnosis not present

## 2024-02-28 DIAGNOSIS — C541 Malignant neoplasm of endometrium: Secondary | ICD-10-CM | POA: Insufficient documentation

## 2024-02-28 DIAGNOSIS — Z8249 Family history of ischemic heart disease and other diseases of the circulatory system: Secondary | ICD-10-CM | POA: Diagnosis not present

## 2024-02-28 DIAGNOSIS — F418 Other specified anxiety disorders: Secondary | ICD-10-CM

## 2024-02-28 DIAGNOSIS — E1122 Type 2 diabetes mellitus with diabetic chronic kidney disease: Secondary | ICD-10-CM | POA: Diagnosis not present

## 2024-02-28 DIAGNOSIS — Z853 Personal history of malignant neoplasm of breast: Secondary | ICD-10-CM | POA: Diagnosis not present

## 2024-02-28 DIAGNOSIS — D573 Sickle-cell trait: Secondary | ICD-10-CM | POA: Insufficient documentation

## 2024-02-28 DIAGNOSIS — E66813 Obesity, class 3: Secondary | ICD-10-CM | POA: Insufficient documentation

## 2024-02-28 DIAGNOSIS — E1142 Type 2 diabetes mellitus with diabetic polyneuropathy: Secondary | ICD-10-CM | POA: Insufficient documentation

## 2024-02-28 DIAGNOSIS — Z79899 Other long term (current) drug therapy: Secondary | ICD-10-CM | POA: Diagnosis not present

## 2024-02-28 DIAGNOSIS — Z83438 Family history of other disorder of lipoprotein metabolism and other lipidemia: Secondary | ICD-10-CM | POA: Insufficient documentation

## 2024-02-28 DIAGNOSIS — G4733 Obstructive sleep apnea (adult) (pediatric): Secondary | ICD-10-CM

## 2024-02-28 DIAGNOSIS — Z01818 Encounter for other preprocedural examination: Secondary | ICD-10-CM

## 2024-02-28 DIAGNOSIS — E7800 Pure hypercholesterolemia, unspecified: Secondary | ICD-10-CM | POA: Diagnosis not present

## 2024-02-28 DIAGNOSIS — N183 Chronic kidney disease, stage 3 unspecified: Secondary | ICD-10-CM | POA: Insufficient documentation

## 2024-02-28 DIAGNOSIS — I129 Hypertensive chronic kidney disease with stage 1 through stage 4 chronic kidney disease, or unspecified chronic kidney disease: Secondary | ICD-10-CM | POA: Diagnosis not present

## 2024-02-28 DIAGNOSIS — R9389 Abnormal findings on diagnostic imaging of other specified body structures: Secondary | ICD-10-CM

## 2024-02-28 HISTORY — DX: Rash and other nonspecific skin eruption: R21

## 2024-02-28 HISTORY — DX: Sickle-cell trait: D57.3

## 2024-02-28 HISTORY — DX: Flat foot (pes planus) (acquired), unspecified foot: M21.40

## 2024-02-28 HISTORY — DX: Personal history of other diseases of the musculoskeletal system and connective tissue: Z87.39

## 2024-02-28 HISTORY — DX: Low back pain, unspecified: M54.50

## 2024-02-28 HISTORY — DX: Type 2 diabetes mellitus without complications: E11.9

## 2024-02-28 HISTORY — PX: DILATATION & CURRETTAGE/HYSTEROSCOPY WITH RESECTOCOPE: SHX5572

## 2024-02-28 HISTORY — DX: Other intervertebral disc degeneration, lumbar region without mention of lumbar back pain or lower extremity pain: M51.369

## 2024-02-28 HISTORY — DX: Presence of dental prosthetic device (complete) (partial): Z97.2

## 2024-02-28 HISTORY — DX: Abnormal findings on diagnostic imaging of other specified body structures: R93.89

## 2024-02-28 HISTORY — DX: Polyneuropathy, unspecified: G62.9

## 2024-02-28 HISTORY — DX: Diverticulosis of large intestine without perforation or abscess without bleeding: K57.30

## 2024-02-28 HISTORY — DX: Personal history of adenomatous and serrated colon polyps: Z86.0101

## 2024-02-28 HISTORY — DX: Spinal stenosis, lumbar region with neurogenic claudication: M48.062

## 2024-02-28 HISTORY — DX: Other chronic pain: G89.29

## 2024-02-28 HISTORY — DX: Localized edema: R60.0

## 2024-02-28 HISTORY — DX: Tinnitus, bilateral: H93.13

## 2024-02-28 HISTORY — DX: Unspecified osteoarthritis, unspecified site: M19.90

## 2024-02-28 HISTORY — DX: Other forms of dyspnea: R06.09

## 2024-02-28 HISTORY — DX: Chronic kidney disease, stage 3 unspecified: N18.30

## 2024-02-28 LAB — CBC
HCT: 34 % — ABNORMAL LOW (ref 36.0–46.0)
Hemoglobin: 10.9 g/dL — ABNORMAL LOW (ref 12.0–15.0)
MCH: 25.2 pg — ABNORMAL LOW (ref 26.0–34.0)
MCHC: 32.1 g/dL (ref 30.0–36.0)
MCV: 78.7 fL — ABNORMAL LOW (ref 80.0–100.0)
Platelets: 274 K/uL (ref 150–400)
RBC: 4.32 MIL/uL (ref 3.87–5.11)
RDW: 16.8 % — ABNORMAL HIGH (ref 11.5–15.5)
WBC: 8 K/uL (ref 4.0–10.5)
nRBC: 0 % (ref 0.0–0.2)

## 2024-02-28 LAB — BASIC METABOLIC PANEL WITH GFR
Anion gap: 9 (ref 5–15)
BUN: 13 mg/dL (ref 8–23)
CO2: 21 mmol/L — ABNORMAL LOW (ref 22–32)
Calcium: 10.1 mg/dL (ref 8.9–10.3)
Chloride: 111 mmol/L (ref 98–111)
Creatinine, Ser: 1.21 mg/dL — ABNORMAL HIGH (ref 0.44–1.00)
GFR, Estimated: 45 mL/min — ABNORMAL LOW (ref >60)
Glucose, Bld: 95 mg/dL (ref 70–99)
Potassium: 3.9 mmol/L (ref 3.5–5.1)
Sodium: 141 mmol/L (ref 135–145)

## 2024-02-28 LAB — GLUCOSE, CAPILLARY
Glucose-Capillary: 91 mg/dL (ref 70–99)
Glucose-Capillary: 92 mg/dL (ref 70–99)

## 2024-02-28 SURGERY — DILATATION & CURETTAGE/HYSTEROSCOPY WITH RESECTOCOPE
Anesthesia: General | Site: Vagina

## 2024-02-28 MED ORDER — ACETAMINOPHEN 500 MG PO TABS
1000.0000 mg | ORAL_TABLET | Freq: Once | ORAL | Status: AC
  Administered 2024-02-28: 1000 mg via ORAL

## 2024-02-28 MED ORDER — LIDOCAINE HCL 1 % IJ SOLN
INTRAMUSCULAR | Status: DC | PRN
Start: 2024-02-28 — End: 2024-02-28
  Administered 2024-02-28: 10 mL

## 2024-02-28 MED ORDER — LACTATED RINGERS IV SOLN: INTRAVENOUS | Status: DC

## 2024-02-28 MED ORDER — OXYCODONE HCL 5 MG PO TABS: 5.0000 mg | ORAL_TABLET | Freq: Once | ORAL | Status: DC | PRN

## 2024-02-28 MED ORDER — ORAL CARE MOUTH RINSE: 15.0000 mL | Freq: Once | OROMUCOSAL | Status: AC

## 2024-02-28 MED ORDER — SODIUM CHLORIDE 0.9 % IR SOLN
Status: DC | PRN
  Administered 2024-02-28: 3000 mL

## 2024-02-28 MED ORDER — ONDANSETRON HCL 4 MG/2ML IJ SOLN
INTRAMUSCULAR | Status: AC
Start: 2024-02-28 — End: 2024-02-28
  Filled 2024-02-28: qty 2

## 2024-02-28 MED ORDER — PROPOFOL 10 MG/ML IV BOLUS
INTRAVENOUS | Status: DC | PRN
  Administered 2024-02-28: 140 mg via INTRAVENOUS
  Administered 2024-02-28: 160 mg via INTRAVENOUS

## 2024-02-28 MED ORDER — CHLORHEXIDINE GLUCONATE 0.12 % MT SOLN
15.0000 mL | Freq: Once | OROMUCOSAL | Status: AC
  Administered 2024-02-28: 15 mL via OROMUCOSAL

## 2024-02-28 MED ORDER — ONDANSETRON HCL 4 MG/2ML IJ SOLN: 4.0000 mg | Freq: Once | INTRAMUSCULAR | Status: DC | PRN

## 2024-02-28 MED ORDER — AMISULPRIDE (ANTIEMETIC) 5 MG/2ML IV SOLN: 10.0000 mg | Freq: Once | INTRAVENOUS | Status: DC | PRN

## 2024-02-28 MED ORDER — INSULIN ASPART 100 UNIT/ML IJ SOLN: 0.0000 [IU] | INTRAMUSCULAR | Status: DC | PRN

## 2024-02-28 MED ORDER — PHENYLEPHRINE 80 MCG/ML (10ML) SYRINGE FOR IV PUSH (FOR BLOOD PRESSURE SUPPORT)
PREFILLED_SYRINGE | INTRAVENOUS | Status: DC | PRN
  Administered 2024-02-28 (×2): 160 ug via INTRAVENOUS

## 2024-02-28 MED ORDER — PHENYLEPHRINE 80 MCG/ML (10ML) SYRINGE FOR IV PUSH (FOR BLOOD PRESSURE SUPPORT)
PREFILLED_SYRINGE | INTRAVENOUS | Status: AC
  Filled 2024-02-28: qty 10

## 2024-02-28 MED ORDER — SUCCINYLCHOLINE CHLORIDE 200 MG/10ML IV SOSY
PREFILLED_SYRINGE | INTRAVENOUS | Status: DC | PRN
  Administered 2024-02-28: 160 mg via INTRAVENOUS

## 2024-02-28 MED ORDER — LIDOCAINE 2% (20 MG/ML) 5 ML SYRINGE
INTRAMUSCULAR | Status: AC
  Filled 2024-02-28: qty 5

## 2024-02-28 MED ORDER — HYDROCODONE-ACETAMINOPHEN 5-325 MG PO TABS: 1.0000 | ORAL_TABLET | Freq: Four times a day (QID) | ORAL | 0 refills | Status: DC | PRN

## 2024-02-28 MED ORDER — PROPOFOL 10 MG/ML IV BOLUS
INTRAVENOUS | Status: AC
  Filled 2024-02-28: qty 20

## 2024-02-28 MED ORDER — SODIUM CHLORIDE 0.9 % IV SOLN: INTRAVENOUS | Status: DC

## 2024-02-28 MED ORDER — DEXAMETHASONE SODIUM PHOSPHATE 10 MG/ML IJ SOLN
INTRAMUSCULAR | Status: AC
Start: 2024-02-28 — End: 2024-02-28
  Filled 2024-02-28: qty 1

## 2024-02-28 MED ORDER — ACETAMINOPHEN 10 MG/ML IV SOLN
INTRAVENOUS | Status: DC | PRN
Start: 2024-02-28 — End: 2024-02-28
  Administered 2024-02-28: 1000 mg via INTRAVENOUS

## 2024-02-28 MED ORDER — ONDANSETRON HCL 4 MG/2ML IJ SOLN
INTRAMUSCULAR | Status: DC | PRN
  Administered 2024-02-28: 4 mg via INTRAVENOUS

## 2024-02-28 MED ORDER — SODIUM CHLORIDE (PF) 0.9 % IJ SOLN
INTRAMUSCULAR | Status: AC
  Filled 2024-02-28: qty 10

## 2024-02-28 MED ORDER — OXYCODONE HCL 5 MG/5ML PO SOLN: 5.0000 mg | Freq: Once | ORAL | Status: DC | PRN

## 2024-02-28 MED ORDER — DEXAMETHASONE SODIUM PHOSPHATE 10 MG/ML IJ SOLN
INTRAMUSCULAR | Status: DC | PRN
  Administered 2024-02-28: 4 mg via INTRAVENOUS

## 2024-02-28 MED ORDER — CHLORHEXIDINE GLUCONATE 0.12 % MT SOLN
OROMUCOSAL | Status: AC
  Filled 2024-02-28: qty 15

## 2024-02-28 MED ORDER — LIDOCAINE 2% (20 MG/ML) 5 ML SYRINGE
INTRAMUSCULAR | Status: DC | PRN
  Administered 2024-02-28 (×2): 100 mg via INTRAVENOUS

## 2024-02-28 MED ORDER — POVIDONE-IODINE 10 % EX SWAB: 2.0000 | Freq: Once | CUTANEOUS | Status: DC

## 2024-02-28 MED ORDER — FENTANYL CITRATE (PF) 100 MCG/2ML IJ SOLN: 25.0000 ug | INTRAMUSCULAR | Status: DC | PRN

## 2024-02-28 MED ORDER — LIDOCAINE HCL 1 % IJ SOLN
INTRAMUSCULAR | Status: AC
Start: 2024-02-28 — End: 2024-02-28
  Filled 2024-02-28: qty 20

## 2024-02-28 MED ORDER — ACETAMINOPHEN 500 MG PO TABS
ORAL_TABLET | ORAL | Status: AC
  Filled 2024-02-28: qty 2

## 2024-02-28 SURGICAL SUPPLY — 15 items
GLOVE BIO SURGEON STRL SZ7 (GLOVE) ×1 IMPLANT
GLOVE BIOGEL PI IND STRL 7.0 (GLOVE) IMPLANT
GLOVE BIOGEL PI IND STRL 7.5 (GLOVE) IMPLANT
GLOVE SURG UNDER POLY LF SZ7 (GLOVE) ×1 IMPLANT
GOWN STRL REUS W/ TWL LRG LVL3 (GOWN DISPOSABLE) ×2 IMPLANT
GOWN STRL SURGICAL XL XLNG (GOWN DISPOSABLE) IMPLANT
KIT PROCEDURE FLUENT (KITS) ×1 IMPLANT
KIT TURNOVER KIT B (KITS) ×1 IMPLANT
PACK VAGINAL MINOR WOMEN LF (CUSTOM PROCEDURE TRAY) ×1 IMPLANT
PAD OB MATERNITY 11 LF (PERSONAL CARE ITEMS) ×1 IMPLANT
SEAL ROD LENS SCOPE MYOSURE (ABLATOR) ×1 IMPLANT
SOL .9 NS 3000ML IRR UROMATIC (IV SOLUTION) IMPLANT
SOL PREP POV-IOD 4OZ 10% (MISCELLANEOUS) IMPLANT
TOWEL GREEN STERILE FF (TOWEL DISPOSABLE) ×2 IMPLANT
UNDERPAD 30X36 HEAVY ABSORB (UNDERPADS AND DIAPERS) ×1 IMPLANT

## 2024-02-28 NOTE — H&P (Signed)
 [name] is an 78 y.o. female.   78 year old female who presents for further evaluation of thickened endometrium that was incidentally found during an evaluation for her abdominal pain.  A CT scan for abdominal pain was performed which showed a thickened appearing endometrium.  Pelvic ultrasound was performed which showedCentral area within the uterus measuring 3.1 cm with some heterogeneity in appearance.  No abnormal Doppler signal was noted.  The patient had not experienced any symptoms of vaginal bleeding.  Given the lesion measured greater than 11 mm it was recommended to proceed with hysteroscopy, D&C and possible polypectomy versus myomectomy.  Of note on the morning of surgery patient reports feeling pelvic cramping like she was going to have her period and she had an episode of acute vaginal bleeding.  She reports she has not had any vaginal bleeding prior to this.   No LMP recorded. Patient is postmenopausal.    Past Medical History:  Diagnosis Date   Anemia    Anxiety    Chronic fatigue syndrome    Chronic low back pain    followed by dr jinny. mavis   CKD (chronic kidney disease), stage III (HCC)    followed by pcp   Constipation 02/20/2024   02-21-2024  pt stated viist w/ PCP due to constipation given stool softner and abdominal xray done   Coronary artery calcification 05/2022   cardiologist--- dr s. michele;   CT 11/ 2023 ;  CCTA 09-04-2022  calcium  score=36.9 involving LAD w/ mild nonob dz;  NUC 08-14-2022  LR w/ mild perfusion defect reversible, nuclear ef 60%   DDD (degenerative disc disease), lumbar    Depression    Diverticulosis of colon    DOE (dyspnea on exertion)    02-21-2024  sob w/ stairs/ long distance walking/  okay with household chores ,  per cardiologist note 09/2023  chronic DOE stable   Edema of both lower extremities    02-21-2024 pt stated left > right   Flat foot, acquired    History of adenomatous polyp of colon    GI-- dr shila   History of  gout    History of left breast cancer 2013   oncologist--- dr layla (lov 09-06-2017 released w/ no recurrence) ;  08-30-2011  s/p left lumpectomy w/ node dissection;  Stage IAG1, IDC, ER/PR+, nodes negative;  completed IMRT 11-24-2011 ,  completed tamoxifen  02/ 2019   Hyperlipidemia    Hypertension    Lumbar stenosis with neurogenic claudication    per pt left leg   OA (osteoarthritis)    OSA on CPAP 07/2004   followed by [facility]--- dr jude;   sleep study in epic 08-05-2004 severe osa, (02-21-2024 pt stated uses cpap nightly)   Peripheral neuropathy    Personal history of radiation therapy 09/2011   left breast/ chest wall   10-29-2011  to  11-24-2011  6100 cGy   Rash    02-21-2024  pt stated has rash under abd fold and between thighs   Sickle cell trait (HCC)    Thickened endometrium    Tinnitus of both ears    Type 2 diabetes mellitus (HCC)    followed by pcp:   (02-21-2024  pt stated checks  blood sugar 2 times montly , not fasting)   Wears partial dentures    upper    Past Surgical History:  Procedure Laterality Date   APPENDECTOMY  1978   BIOPSY  10/06/2021   Procedure: BIOPSY;  Surgeon: Nandigam, Kavitha V,  MD;  Location: [facility];  Service: Endoscopy;;   BLEPHAROPLASTY Right    age 15s---  right upper eyelid   BREAST LUMPECTOMY WITH NEEDLE LOCALIZATION Right 02/06/2013   Procedure: RIGHT BREAST LUMPECTOMY WITH NEEDLE LOCALIZATION;  Surgeon: Vicenta DELENA Poli, MD;  Location: [facility];  Service: General;  Laterality: Right;   BREAST LUMPECTOMY WITH NEEDLE LOCALIZATION AND AXILLARY SENTINEL LYMPH NODE BX Left 08/30/2011   @[facility]  by dr d. poli;   CATARACT EXTRACTION W/ INTRAOCULAR LENS IMPLANT Bilateral 2018   COLONOSCOPY WITH PROPOFOL  N/A 10/27/2015   Procedure: COLONOSCOPY WITH PROPOFOL ;  Surgeon: Gustav Shila GAILS, MD;  Location: [facility];  Service: Endoscopy;  Laterality: N/A;   COLONOSCOPY WITH PROPOFOL  N/A 10/06/2021   Procedure: COLONOSCOPY WITH PROPOFOL ;   Surgeon: Shila Gustav GAILS, MD;  Location: [facility];  Service: Endoscopy;  Laterality: N/A;   HARDWARE REMOVAL Left 04/20/2015   Procedure: HARDWARE REMOVAL LEFT LUMBAR FIVE SCREW;  Surgeon: Darina Boehringer, MD;  Location: [facility] OR;  Service: Neurosurgery;  Laterality: Left;   HYSTEROSCOPY WITH D & C  10/04/2005   @[facility]  by dr a. okey;  polypectomy   POLYPECTOMY  10/06/2021   Procedure: POLYPECTOMY;  Surgeon: Shila Gustav GAILS, MD;  Location: [facility];  Service: Endoscopy;;   POSTERIOR FUSION LUMBAR SPINE  04/19/2020   @[facility]  by dr jonelle. kritzer:  Laminectomy / Decompression  L2--3 /  interbody fusion w/ segmental instrumentation L2--L5   POSTERIOR LUMBAR FUSION 2 LEVEL  12/29/2014   @[facility]  by dr jonelle. kritzer;   L3--4  and L4--5   THYROID  CYST EXCISION  1994   benign   TOTAL KNEE ARTHROPLASTY Left 08/19/2004   @[facility]  by dr r. gioffre   TOTAL KNEE ARTHROPLASTY Right 05/24/2005   @[facility]  by dr r. gioffre   TRANSPHENOIDAL PITUITARY RESECTION  01/25/2010   @[facility]  by dr r. kritzer/ dr c. 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 She has never used smokeless tobacco. She reports that she does not drink alcohol and does not use drugs.  Allergies:  Allergies  Allergen Reactions   Lyrica  [Pregabalin ]     Dizziness   Cymbalta [Duloxetine Hcl] Other (See Comments)    Stomach pain   Gabapentin  Itching   Lisinopril  Swelling   Maxzide [Hydrochlorothiazide -Triamterene] Other (See Comments)    Cramping    Niacin  Itching   Pravastatin Sodium Other (See Comments)    Headache     Simvastatin  Other (See Comments)    Memory loss   Lodine [Etodolac] Itching    Medications Prior to Admission  Medication Sig Dispense Refill Last Dose/Taking   acetaminophen  (TYLENOL ) 500 MG tablet Take 1,000 mg by mouth 2 (two) times daily as needed for headache.   Past Week   aspirin  EC 81 MG tablet Take 81 mg by mouth daily. Swallow whole.   02/27/2024 Morning   atorvastatin  (LIPITOR) 20 MG tablet TAKE 1 TABLET(20 MG) BY MOUTH AT BEDTIME (Patient  taking differently: Take 20 mg by mouth daily.) 90 tablet 3 02/28/2024 at 10:30 AM   Cholecalciferol  (VITAMIN D -3) 25 MCG (1000 UT) CAPS Take 1 capsule by mouth daily.   Past Week   clotrimazole-betamethasone  (LOTRISONE) cream Apply 1 Application topically 2 (two) times daily.   Past Week   docusate sodium  (COLACE) 100 MG capsule Take 1-3 capsules by mouth daily.   Unknown   ergocalciferol  (VITAMIN D2) 1.25 MG (50000 UT) capsule Take 50,000 Units by mouth once a week.   Past Week   escitalopram  (LEXAPRO ) 20 MG tablet Take 20 mg by mouth daily.   02/28/2024 at 10:30 AM   ezetimibe  (ZETIA ) 10 MG tablet Take 10 mg by mouth at bedtime.   02/27/2024   Glycerin-Polysorbate 80 (REFRESH DRY EYE THERAPY OP) Apply 1 drop to eye in the morning and at bedtime.   Taking   hydrALAZINE (APRESOLINE) 25 MG tablet Take 25 mg by mouth daily.   02/28/2024 at 10:30 AM   hydrochlorothiazide  (HYDRODIURIL ) 25 MG tablet Take 1 tablet (25 mg total) by mouth daily. (Patient taking differently: Take 25 mg by mouth daily.) 30 tablet 0 02/27/2024   HYDROcodone -acetaminophen  (NORCO/VICODIN) 5-325 MG tablet Take 1-2 tablets by mouth every 6 (six) hours as needed. 10 tablet 0 Unknown   hydrOXYzine (ATARAX) 25 MG tablet Take 25 mg by mouth 2 (two) times daily as needed for itching or anxiety.   02/27/2024   irbesartan  (AVAPRO ) 300 MG tablet Take 1 tablet (300 mg total) by mouth at bedtime. (Patient taking differently: Take 300 mg by mouth daily.) 30 tablet 0 02/27/2024   metFORMIN   (GLUCOPHAGE ) 500 MG tablet Take 1 tablet (500 mg total) by mouth daily with breakfast. (Patient taking differently: Take 500 mg by mouth daily.) 90 tablet 0 02/27/2024   nystatin powder Apply 1 Application topically 2 (two) times daily.   Past Week   verapamil  (VERELAN  PM) 240 MG 24 hr capsule Take 240 mg by mouth at bedtime.   02/27/2024   blood glucose meter kit and supplies KIT Dispense based on patient and insurance preference. Use up to four times daily as directed. (FOR ICD-9 250.00, 250.01). 1 each 0    empagliflozin  (JARDIANCE ) 10 MG TABS tablet Take 1 tablet (10 mg total) by mouth daily. (Patient not taking: Reported on 02/21/2024) 90 tablet 3 Not Taking   metoCLOPramide  (REGLAN ) 10 MG tablet Take 1 tablet (10 mg total) by mouth 3 (three) times daily as needed for up to 15 doses for nausea. (Patient not taking: Reported on 02/21/2024) 15 tablet 0 Not Taking    Review of Systems  Blood pressure (!) 173/83, pulse 77, temperature 98.6 F (37 C), temperature source Oral, resp. rate 19, height 5' 6 (1.676 m), weight 124.7 kg, SpO2 95%. Physical Exam Alert and oriented x 3 Normal work of breathing Abdomen obese, soft  No results found for this or any previous visit (from the past 24 hours).  No results found.  Assessment/Plan: 1) admit 2) informed consent obtained for hysteroscopy, D&C, possible polypectomy versus myomectomy.  Risks/benefits/alternatives of the procedure were discussed with the patient at length and she wishes to proceed. 3) SCDs for DVT prophylaxis.  Marjorie Gull 02/28/2024, 11:58 AM

## 2024-02-28 NOTE — Anesthesia Postprocedure Evaluation (Signed)
 Anesthesia Post Note  Patient: [name]  Procedure(s) Performed: DILATATION & CURETTAGE/HYSTEROSCOPY (Vagina )     Patient location during evaluation: PACU Anesthesia Type: General Level of consciousness: awake and alert and oriented Pain management: pain level controlled Vital Signs Assessment: post-procedure vital signs reviewed and stable Respiratory status: spontaneous breathing, nonlabored ventilation and respiratory function stable Cardiovascular status: blood pressure returned to baseline and stable Postop Assessment: no apparent nausea or vomiting Anesthetic complications: no   No notable events documented.  Last Vitals:  Vitals:   02/28/24 1435 02/28/24 1450  BP:    Pulse: 71 70  Resp: 17 19  Temp:    SpO2: 99% 99%    Last Pain:  Vitals:   02/28/24 1136  TempSrc: Oral  PainSc: 0-No pain                 Ranya Fiddler A.

## 2024-02-28 NOTE — Anesthesia Preprocedure Evaluation (Signed)
 Anesthesia Evaluation  Patient identified by MRN, date of birth, ID band Patient awake    Reviewed: Allergy & Precautions, NPO status , Patient's Chart, lab work & pertinent test results, reviewed documented beta blocker date and time   Airway Mallampati: II  TM Distance: >3 FB     Dental  (+) Partial Upper, Missing, Dental Advisory Given   Pulmonary shortness of breath and with exertion, sleep apnea and Continuous Positive Airway Pressure Ventilation    Pulmonary exam normal breath sounds clear to auscultation       Cardiovascular hypertension, Pt. on medications + CAD and + DOE  Normal cardiovascular exam Rhythm:Regular Rate:Normal     Neuro/Psych  PSYCHIATRIC DISORDERS Anxiety Depression    Peripheral neuropathy  Neuromuscular disease    GI/Hepatic negative GI ROS, Neg liver ROS,,,  Endo/Other  diabetes, Well Controlled, Type 2, Oral Hypoglycemic Agents  Class 3 obesityHLD Gout Hx/o breast Ca  Renal/GU Renal InsufficiencyRenal disease  negative genitourinary   Musculoskeletal  (+) Arthritis , Osteoarthritis,  Hx/o back surgery x 2 Chronic back pain   Abdominal  (+) + obese  Peds  Hematology  (+) Blood dyscrasia, Sickle cell trait and anemia   Anesthesia Other Findings   Reproductive/Obstetrics                              Anesthesia Physical Anesthesia Plan  ASA: 3  Anesthesia Plan: General   Post-op Pain Management: Minimal or no pain anticipated, Dilaudid  IV, Precedex and Ofirmev  IV (intra-op)*   Induction: Intravenous  PONV Risk Score and Plan: 4 or greater and Treatment may vary due to age or medical condition and Ondansetron   Airway Management Planned: LMA  Additional Equipment: None  Intra-op Plan:   Post-operative Plan: Extubation in OR  Informed Consent: I have reviewed the patients History and Physical, chart, labs and discussed the procedure including the  risks, benefits and alternatives for the proposed anesthesia with the patient or authorized representative who has indicated his/her understanding and acceptance.     Dental advisory given  Plan Discussed with: Anesthesiologist and CRNA  Anesthesia Plan Comments:          Anesthesia Quick Evaluation

## 2024-02-28 NOTE — Op Note (Signed)
 Pre-Operative Diagnosis: 1) thickened endometrium Postoperative Diagnosis: 1) thickened endometrium  Procedure: Hysteroscopy, dilation and curettage Surgeon: Dr. Marjorie Gull Assistant: None Operative Findings: Cervical descensus to the level of the vaginal introitus.  Unable to adequately visualize the endometrial cavity due to the presence of blood and clots.  Copious seedlike endometrium and clot were removed with sharp curettage.  With curettage the posterior wall of the endometrial cavity had a cobblestone feeling.  Endometrial cavity was not completely evacuated of tissue.  Fluid deficit 85 cc Specimen: Endometrial curettings EBL: Total I/O In: 650 [I.V.:550; IV Piggyback:100] Out: 200 [Blood:200]   [name] Is a 78 year old who presents for surgical management for thickened endometrium found incidentally on a CT scan during a evaluation for abdominal pain. Please see the patient's history and physical for complete details of the history. Management options were discussed with the patient. R/B/A reviewed. Following appropriate informed consent was taken to the operating room. The patient was appropriately identified during a time out procedure.  General anesthesia was administered and the patient was placed in the dorsal lithotomy position. The patient was prepped and draped in the normal sterile fashion.  Due to uterine prolapse the cervix was noted at the level of the vaginal introitus.  No speculum was required.  A single-tooth tenaculum was placed on the anterior lip of the cervix, and 10 cc of 1% lidocaine  was administered in a paracervical fashion. The cervix was serially dilated with Hank dilators. The uterus sounded to >10 cm.  The hysteroscope was introduced for the above findings.  A sharp curettage was performed for copious amounts of seedlike endometrial tissue.  Even after removal of at least 50 cc of endometrial tissue the endometrial cavity could not be completely visualized.   The decision was made to discontinue additional curettage as adequate tissue was obtained for a appropriate diagnosis.  The patient was having minimal bleeding following the procedure.  All sponge, instrument, needle counts were correct.  The patient was transferred to the PACU in stable condition following the procedure.

## 2024-02-28 NOTE — Transfer of Care (Signed)
 Immediate Anesthesia Transfer of Care Note  Patient: [name]  Procedure(s) Performed: DILATATION & CURETTAGE/HYSTEROSCOPY (Vagina )  Patient Location: PACU  Anesthesia Type:General  Level of Consciousness: awake, alert , oriented, and patient cooperative  Airway & Oxygen Therapy: Patient Spontanous Breathing and Patient connected to face mask oxygen  Post-op Assessment: Report given to RN, Post -op Vital signs reviewed and stable, Patient moving all extremities X 4, and Patient able to stick tongue midline  Post vital signs: Reviewed and stable  Last Vitals:  Vitals Value Taken Time  BP 161/70   Temp 98 F   Pulse 86 02/28/24 14:18  Resp 19 02/28/24 14:18  SpO2 97 % 02/28/24 14:18  Vitals shown include unfiled device data.  Last Pain:  Vitals:   02/28/24 1136  TempSrc: Oral  PainSc: 0-No pain      Patients Stated Pain Goal: 7 (02/28/24 1136)  Complications: No notable events documented.

## 2024-02-28 NOTE — Anesthesia Procedure Notes (Signed)
 Procedure Name: Intubation Date/Time: 02/28/2024 1:26 PM  Performed by: Viviana Almarie DASEN, CRNAPre-anesthesia Checklist: Patient identified, Emergency Drugs available, Suction available and Patient being monitored Patient Re-evaluated:Patient Re-evaluated prior to induction Oxygen Delivery Method: Circle system utilized Preoxygenation: Pre-oxygenation with 100% oxygen Induction Type: IV induction Ventilation: Mask ventilation without difficulty Tube type: Oral Tube size: 7.0 mm Number of attempts: 1 Airway Equipment and Method: Stylet and Bite block Placement Confirmation: ETT inserted through vocal cords under direct vision, positive ETCO2 and breath sounds checked- equal and bilateral Secured at: 22 cm Tube secured with: Tape Dental Injury: Teeth and Oropharynx as per pre-operative assessment

## 2024-02-28 NOTE — Discharge Instructions (Signed)
   Post Anesthesia Home Care Instructions  Activity: Get plenty of rest for the remainder of the day. A responsible adult should stay with you for 24 hours following the procedure.  For the next 24 hours, DO NOT: -Drive a car -Advertising copywriter -Drink alcoholic beverages -Take any medication unless instructed by your physician -Make any legal decisions or sign important papers.  Meals: Start with liquid foods such as gelatin or soup. Progress to regular foods as tolerated. Avoid greasy, spicy, heavy foods. If nausea and/or vomiting occur, drink only clear liquids until the nausea and/or vomiting subsides. Call your physician if vomiting continues.  Special Instructions/Symptoms: Your throat may feel dry or sore from the anesthesia or the breathing tube placed in your throat during surgery. If this causes discomfort, gargle with warm salt water. The discomfort should disappear within 24 hours.

## 2024-02-29 ENCOUNTER — Encounter (HOSPITAL_COMMUNITY): Payer: Self-pay | Admitting: Obstetrics and Gynecology

## 2024-02-29 LAB — HEMOGLOBIN A1C
Hgb A1c MFr Bld: 6.1 % — ABNORMAL HIGH (ref 4.8–5.6)
Mean Plasma Glucose: 128 mg/dL

## 2024-03-04 LAB — SURGICAL PATHOLOGY

## 2024-03-05 ENCOUNTER — Telehealth: Payer: Self-pay

## 2024-03-05 NOTE — Telephone Encounter (Signed)
 Spoke with the patient regarding the referral to GYN oncology. Patient scheduled as new patient with Dr Viktoria on 03/07/2024. Patient given an arrival time of 10:45am.  Explained to the patient the the doctor will perform a pelvic exam at this visit. Patient given the policy that only one visitor allowed and that visitor must be over 16 yrs are allowed in the Cancer Center. Patient given the address/phone number for the clinic and that the center offers free valet service. Patient aware that masks required.

## 2024-03-06 ENCOUNTER — Encounter: Payer: Self-pay | Admitting: Gynecologic Oncology

## 2024-03-06 NOTE — H&P (View-Only) (Signed)
 GYNECOLOGIC ONCOLOGY NEW PATIENT CONSULTATION   Patient Name: [name]  Patient Age: 78 y.o. Date of Service: 03/07/24 Referring Provider: Marjorie Gull, MD  Primary Care Provider: [name], MD Consulting Provider: Comer Dollar, MD   Assessment/Plan:  Postmenopausal patient with grade 2 endometrioid endometrial adenocarcinoma.  We reviewed the nature of endometrial cancer and its recommended surgical staging, including total hysterectomy, bilateral salpingo-oophorectomy, and lymph node assessment. The patient is a suitable candidate for staging via a minimally invasive approach to surgery.  We reviewed that robotic assistance would be used to complete the surgery.   We discussed that most endometrial cancer is detected early and that decisions regarding adjuvant therapy will be made based on her final pathology.   The patient voiced some hesitation about major surgery.  We reviewed other treatments for endometrial cancer including hormonal therapy with progesterone and radiation therapy.  Unfortunately, given her history of estrogen and progesterone receptor positive breast cancer, she is not a good candidate for progesterone therapy.  We reviewed the sentinel lymph node technique. Risks and benefits of sentinel lymph node biopsy was reviewed. We reviewed the technique and ICG dye. The patient DOES NOT have an iodine  allergy or known liver dysfunction. We reviewed the false negative rate (0.4%), and that 3% of patients with metastatic disease will not have it detected by SLN biopsy in endometrial cancer. A low risk of allergic reaction to the dye, <0.2% for ICG, has been reported. We also discussed that in the case of failed mapping, which occurs 40% of the time, a bilateral or unilateral lymphadenectomy will be performed at the surgeon's discretion.   Potential benefits of sentinel nodes including a higher detection rate for metastasis due to ultrastaging and potential  reduction in operative morbidity. However, there remains uncertainty as to the role for treatment of micrometastatic disease. Further, the benefit of operative morbidity associated with the SLN technique in endometrial cancer is not yet completely known. In other patient populations (e.g. the cervical cancer population) there has been observed reductions in morbidity with SLN biopsy compared to pelvic lymphadenectomy. Lymphedema, nerve dysfunction and lymphocysts are all potential risks with the SLN technique as with complete lymphadenectomy. Additional risks to the patient include the risk of damage to an internal organ while operating in an altered view (e.g. the black and white image of the robotic fluorescence imaging mode).   We discussed the plan for a robotic assisted hysterectomy, bilateral salpingo-oophorectomy, sentinel lymph node evaluation, possible lymph node dissection, possible laparotomy. The risks of surgery were discussed in detail and she understands these to include infection; wound separation; hernia; vaginal cuff separation, injury to adjacent organs such as bowel, bladder, blood vessels, ureters and nerves; bleeding which may require blood transfusion; anesthesia risk; thromboembolic events; possible death; unforeseen complications; possible need for re-exploration; medical complications such as heart attack, stroke, pleural effusion and pneumonia; and, if full lymphadenectomy is performed the risk of lymphedema and lymphocyst. The patient will receive DVT and antibiotic prophylaxis as indicated. She voiced a clear understanding. She had the opportunity to ask questions. Perioperative instructions were reviewed with her. Prescriptions for post-op medications were sent to her pharmacy of choice.  We will reach out to PCP, pulmonologist and cardiologist for clearance.  Given her ventral hernia, will try to coordinate having one of general surgeons available in the event that a more complex  hernia repair is required.  Discussed recommendation to start MiraLAX  for her constipation.  A copy of this note was sent  to the patient's referring provider.   75 minutes of total time was spent for this patient encounter, including preparation, face-to-face counseling with the patient and coordination of care, and documentation of the encounter.  Comer Dollar, MD  Division of Gynecologic Oncology  Department of Obstetrics and Gynecology  [facility]  ___________________________________________  Chief Complaint: Chief Complaint  Patient presents with   endometrial cancer    History of Present Illness:  [name] is a 78 y.o. y.o. female who is seen in consultation at the request of Dr. Okey for an evaluation of endometrial cancer.  Incidental finding on CT scan in February showed thickened endometrium.  Her scan also showed cholelithiasis, diverticulosis, and nonobstructing right lower pole kidney stone.  At that time, patient denied any postmenopausal bleeding.  She was having some right lower quadrant pain radiating down to her hip. Pelvic ultrasound on 09/26/2023 showed uterus measuring 9.7 x 5.5 x 6.3 with an endometrial lining thickened at 33.4 mm.  Adnexa not identified but no cystic or solid masses noted. Hysteroscopy with endometrial sampling was performed on 7/31 with findings of blood and clots within the endometrial cavity limiting visualization.  Copious seedlike endometrium and clot were removed with sharp curettage. Final pathology from biopsy showed FIGO grade 2 endometrioid endometrial adenocarcinoma.  She endorses some heavy bleeding after the procedure which stopped as of yesterday.  She endorses constipation, worsens her surgery.  Has been taking Colace without much relief.  Endorses frequent flatus.  Sometimes has pain and feels a knot along the midline of her abdomen, especially when she lays back.  Endorses 2 to 3 months of  intermittent pain in her right pelvis that radiates down her thigh.  Last A1c on 02/28/24 was 6.1%.  She endorses history of OSA, uses a CPAP.  Has shortness of breath with climbing stairs, denies significant shortness of breath with ambulation otherwise.  She had ER/PR positive breast cancer diagnosed in 2013 treated with surgery, adjuvant radiation, and 5 years of tamoxifen .  The patient surgical history is notable for an appendectomy.  This was done through a paramedian incision.  Given rupture, postoperative drain was placed.  She is retired. Lives in [place] with her husband.  PAST MEDICAL HISTORY:  Past Medical History:  Diagnosis Date   Anemia    Anxiety    Chronic fatigue syndrome    Chronic low back pain    followed by dr jinny. mavis   CKD (chronic kidney disease), stage III (HCC)    followed by pcp   Constipation 02/20/2024   02-21-2024  pt stated viist w/ PCP due to constipation given stool softner and abdominal xray done   Coronary artery calcification 05/2022   cardiologist--- dr s. michele;   CT 11/ 2023 ;  CCTA 09-04-2022  calcium  score=36.9 involving LAD w/ mild nonob dz;  NUC 08-14-2022  LR w/ mild perfusion defect reversible, nuclear ef 60%   DDD (degenerative disc disease), lumbar    Depression    Diverticulosis of colon    DOE (dyspnea on exertion)    02-21-2024  sob w/ stairs/ long distance walking/  okay with household chores ,  per cardiologist note 09/2023  chronic DOE stable   Edema of both lower extremities    02-21-2024 pt stated left > right   Flat foot, acquired    History of adenomatous polyp of colon    GI-- dr shila   History of gout    History of left breast  cancer 2013   oncologist--- dr layla (lov 09-06-2017 released w/ no recurrence) ;  08-30-2011  s/p left lumpectomy w/ node dissection;  Stage IAG1, IDC, ER/PR+, nodes negative;  completed IMRT 11-24-2011 ,  completed tamoxifen  02/ 2019   Hyperlipidemia    Hypertension    Lumbar stenosis  with neurogenic claudication    per pt left leg   OA (osteoarthritis)    OSA on CPAP 07/2004   followed by [facility]--- dr jude;   sleep study in epic 08-05-2004 severe osa, (02-21-2024 pt stated uses cpap nightly)   Peripheral neuropathy    Personal history of radiation therapy 09/2011   left breast/ chest wall   10-29-2011  to  11-24-2011  6100 cGy   Rash    02-21-2024  pt stated has rash under abd fold and between thighs   Sickle cell trait (HCC)    Thickened endometrium    Tinnitus of both ears    Type 2 diabetes mellitus (HCC)    followed by pcp:   (02-21-2024  pt stated checks  blood sugar 2 times montly , not fasting)   Wears partial dentures    upper     PAST SURGICAL HISTORY:  Past Surgical History:  Procedure Laterality Date   APPENDECTOMY  1978   BIOPSY  10/06/2021   Procedure: BIOPSY;  Surgeon: Shila Gustav GAILS, MD;  Location: [facility];  Service: Endoscopy;;   BLEPHAROPLASTY Right    age 68s---  right upper eyelid   BREAST LUMPECTOMY WITH NEEDLE LOCALIZATION Right 02/06/2013   Procedure: RIGHT BREAST LUMPECTOMY WITH NEEDLE LOCALIZATION;  Surgeon: Vicenta DELENA Poli, MD;  Location: [facility] OR;  Service: General;  Laterality: Right;   BREAST LUMPECTOMY WITH NEEDLE LOCALIZATION AND AXILLARY SENTINEL LYMPH NODE BX Left 08/30/2011   @[facility]  by dr d. poli;   CATARACT EXTRACTION W/ INTRAOCULAR LENS IMPLANT Bilateral 2018   COLONOSCOPY WITH PROPOFOL  N/A 10/27/2015   Procedure: COLONOSCOPY WITH PROPOFOL ;  Surgeon: Gustav Shila GAILS, MD;  Location: [facility];  Service: Endoscopy;  Laterality: N/A;   COLONOSCOPY WITH PROPOFOL  N/A 10/06/2021   Procedure: COLONOSCOPY WITH PROPOFOL ;  Surgeon: Shila Gustav GAILS, MD;  Location: [facility];  Service: Endoscopy;  Laterality: N/A;   DILATATION & CURRETTAGE/HYSTEROSCOPY WITH RESECTOCOPE N/A 02/28/2024   Procedure: DILATATION & CURETTAGE/HYSTEROSCOPY;  Surgeon: Okey Leader, MD;  Location: [facility] OR;  Service: Gynecology;  Laterality:  N/A;   HARDWARE REMOVAL Left 04/20/2015   Procedure: HARDWARE REMOVAL LEFT LUMBAR FIVE SCREW;  Surgeon: Darina Boehringer, MD;  Location: [facility] OR;  Service: Neurosurgery;  Laterality: Left;   HYSTEROSCOPY WITH D & C  10/04/2005   @[facility]  by dr a. okey;  polypectomy   POLYPECTOMY  10/06/2021   Procedure: POLYPECTOMY;  Surgeon: Shila Gustav GAILS, MD;  Location: [facility];  Service: Endoscopy;;   POSTERIOR FUSION LUMBAR SPINE  04/19/2020   @[facility]  by dr r. kritzer:  Laminectomy / Decompression  L2--3 /  interbody fusion w/ segmental instrumentation L2--L5   POSTERIOR LUMBAR FUSION 2 LEVEL  12/29/2014   @[facility]  by dr jonelle. kritzer;   L3--4  and L4--5   THYROID  CYST EXCISION  1994   benign   TOTAL KNEE ARTHROPLASTY Left 08/19/2004   @[facility]  by dr r. gioffre   TOTAL KNEE ARTHROPLASTY Right 05/24/2005   @[facility]  by dr r. gioffre   TRANSPHENOIDAL PITUITARY RESECTION  01/25/2010   @[facility]  by dr r. kritzer/ dr c. 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    OB/GYN HISTORY:  OB History  Gravida Para Term Preterm AB Living  2 2 2   2   SAB IAB Ectopic Multiple Live Births      2    # Outcome Date GA Lbr Len/2nd Weight Sex Type Anes PTL Lv  2 Term     F      1 Term     M        No LMP recorded. Patient is postmenopausal.  Age at menarche: 34 Age at menopause: 40 Hx of HRT: denies Hx of STDs: denies Last pap: 2015 History of abnormal pap smears: denies  SCREENING STUDIES:  Last mammogram: 2025  Last colonoscopy: 2023 Last bone mineral density: 2025  MEDICATIONS: Outpatient Encounter Medications as of 03/07/2024  Medication Sig   acetaminophen  (TYLENOL ) 500 MG tablet Take 1,000 mg by mouth 2 (two) times daily as needed for headache.   aspirin  EC 81 MG tablet Take 81 mg by mouth daily. Swallow whole.   atorvastatin  (LIPITOR) 20 MG tablet TAKE 1 TABLET(20 MG) BY MOUTH AT BEDTIME   blood glucose meter kit and supplies KIT Dispense based on patient and insurance preference. Use up  to four times daily as directed. (FOR ICD-9 250.00, 250.01).   Cholecalciferol  (VITAMIN D -3) 25 MCG (1000 UT) CAPS Take 1 capsule by mouth daily.   clotrimazole-betamethasone  (LOTRISONE) cream Apply 1 Application topically 2 (two) times daily.   docusate sodium  (COLACE) 100 MG capsule Take 1-3 capsules by mouth daily.   escitalopram  (LEXAPRO ) 20 MG tablet Take 20 mg by mouth daily.   ezetimibe  (ZETIA ) 10 MG tablet Take 10 mg by mouth at bedtime.   Glycerin-Polysorbate 80 (REFRESH DRY EYE THERAPY OP) Apply 1 drop to eye in the morning and at bedtime.   hydrALAZINE (APRESOLINE) 25 MG tablet Take 25 mg by mouth daily.   hydrochlorothiazide  (HYDRODIURIL ) 25 MG tablet Take 1 tablet (25 mg total) by mouth daily.   HYDROcodone -acetaminophen  (NORCO/VICODIN) 5-325 MG tablet Take 1-2 tablets by mouth every 6 (six) hours as needed.   hydrOXYzine (ATARAX) 25 MG tablet Take 25 mg by mouth 2 (two) times daily as needed for itching or anxiety.   irbesartan  (AVAPRO ) 300 MG tablet Take 1 tablet (300 mg total) by mouth at bedtime.   metFORMIN  (GLUCOPHAGE ) 500 MG tablet Take 1 tablet (500 mg total) by mouth daily with breakfast.   nystatin powder Apply 1 Application topically 2 (two) times daily.   verapamil  (VERELAN  PM) 240 MG 24 hr capsule Take 240 mg by mouth at bedtime.   metoCLOPramide  (REGLAN ) 10 MG tablet Take 1 tablet (10 mg total) by mouth 3 (three) times daily as needed for up to 15 doses for nausea. (Patient not taking: Reported on 02/21/2024)   [DISCONTINUED] empagliflozin  (JARDIANCE ) 10 MG TABS tablet Take 1 tablet (10 mg total) by mouth daily. (Patient not taking: Reported on 02/21/2024)   [DISCONTINUED] ergocalciferol  (VITAMIN D2) 1.25 MG (50000 UT) capsule Take 50,000 Units by mouth once a week.   [DISCONTINUED] HYDROcodone -acetaminophen  (NORCO/VICODIN) 5-325 MG tablet Take 1 tablet by mouth every 6 (six) hours as needed for moderate pain (pain score 4-6).   No facility-administered encounter  medications on file as of 03/07/2024.    ALLERGIES:  Allergies  Allergen Reactions   Lyrica  [Pregabalin ]     Dizziness   Cymbalta [Duloxetine Hcl] Other (See Comments)    Stomach pain   Gabapentin  Itching   Lisinopril  Swelling   Maxzide [Hydrochlorothiazide -Triamterene] Other (See Comments)    Cramping    Niacin   Itching   Pravastatin Sodium Other (See Comments)    Headache    Simvastatin  Other (See Comments)    Memory loss   Lodine [Etodolac] Itching     FAMILY HISTORY:  Family History  Problem Relation Age of Onset   Stroke Mother    Diabetes Mother    Hypertension Mother    Hyperlipidemia Mother    Obesity Mother    Heart disease Father    Clotting disorder Father    Stroke Father    Hypertension Father    Heart Problems Father    Breast cancer Sister 20   Colon cancer Neg Hx    Neuropathy Neg Hx      SOCIAL HISTORY:  Social Connections: Not on file    REVIEW OF SYSTEMS:  + Abdominal pain, leg swelling, ringing in ears, constipation, frequency, joint pain, back pain, itching, problems with walking Denies appetite changes, fevers, chills, fatigue, unexplained weight changes. Denies hearing loss, neck lumps or masses, mouth sores,  voice changes. Denies cough or wheezing.  Denies shortness of breath. Denies chest pain or palpitations. Denies abdominal distention, blood in stools, diarrhea, nausea, vomiting, or early satiety. Denies pain with intercourse, dysuria, hematuria or incontinence. Denies hot flashes, pelvic pain, vaginal bleeding or vaginal discharge.   Denies muscle pain/cramps. Denies rash, or wounds. Denies dizziness, headaches, numbness or seizures. Denies swollen lymph nodes or glands, denies easy bruising or bleeding. Denies anxiety, depression, confusion, or decreased concentration.  Physical Exam:  Vital Signs for this encounter:  Blood pressure (!) 145/78, pulse 65, temperature 98.2 F (36.8 C), temperature source Oral, resp. rate 20,  height 5' 6 (1.676 m), weight 276 lb (125.2 kg), SpO2 100%. Body mass index is 44.55 kg/m. General: Alert, oriented, no acute distress.  HEENT: Normocephalic, atraumatic. Sclera anicteric.  Chest: Clear to auscultation bilaterally. No wheezes, rhonchi, or rales. Cardiovascular: Regular rate and rhythm, no murmurs, rubs, or gallops.  Abdomen: Obese. Normoactive bowel sounds. Soft, nondistended, nontender to palpation. No masses or hepatosplenomegaly appreciated. No palpable fluid wave.  Large paramedian incision, well-healed with hernia, lateral healed incision from prior drain. Extremities: Grossly normal range of motion. Warm, well perfused. No edema bilaterally.  Skin: No rashes or lesions.  Lymphatics: No cervical, supraclavicular, or inguinal adenopathy.  GU:  Normal external female genitalia. No lesions. No discharge or bleeding.             Bladder/urethra:  No lesions or masses.             Vagina: Mildly atrophic, no lesions.             Cervix: Normal appearing, no lesions.  Prolapse of the cervix is seen almost to the level of the introitus.             Uterus: Mobile, no parametrial involvement or nodularity.             Adnexa: No masses appreciated.  Rectal: Deferred.  LABORATORY AND RADIOLOGIC DATA:  Outside medical records were reviewed to synthesize the above history, along with the history and physical obtained during the visit.   Lab Results  Component Value Date   WBC 8.0 02/28/2024   HGB 10.9 (L) 02/28/2024   HCT 34.0 (L) 02/28/2024   PLT 274 02/28/2024   GLUCOSE 95 02/28/2024   CHOL 152 10/02/2022   TRIG 97 10/02/2022   HDL 45 10/02/2022   LDLDIRECT 87 10/02/2022   LDLCALC 89 10/02/2022   ALT 14 10/27/2023   AST  22 10/27/2023   NA 141 02/28/2024   K 3.9 02/28/2024   CL 111 02/28/2024   CREATININE 1.21 (H) 02/28/2024   BUN 13 02/28/2024   CO2 21 (L) 02/28/2024   TSH 1.710 08/31/2016   HGBA1C 6.1 (H) 02/28/2024

## 2024-03-06 NOTE — Progress Notes (Unsigned)
 GYNECOLOGIC ONCOLOGY NEW PATIENT CONSULTATION   Patient Name: [name]  Patient Age: 78 y.o. Date of Service: 03/07/24 Referring Provider: Marjorie Gull, MD  Primary Care Provider: [name], MD Consulting Provider: Comer Dollar, MD   Assessment/Plan:  Postmenopausal patient with grade 2 endometrioid endometrial adenocarcinoma.  We reviewed the nature of endometrial cancer and its recommended surgical staging, including total hysterectomy, bilateral salpingo-oophorectomy, and lymph node assessment. The patient is a suitable candidate for staging via a minimally invasive approach to surgery.  We reviewed that robotic assistance would be used to complete the surgery.   We discussed that most endometrial cancer is detected early and that decisions regarding adjuvant therapy will be made based on her final pathology.   The patient voiced some hesitation about major surgery.  We reviewed other treatments for endometrial cancer including hormonal therapy with progesterone and radiation therapy.  Unfortunately, given her history of estrogen and progesterone receptor positive breast cancer, she is not a good candidate for progesterone therapy.  We reviewed the sentinel lymph node technique. Risks and benefits of sentinel lymph node biopsy was reviewed. We reviewed the technique and ICG dye. The patient DOES NOT have an iodine  allergy or known liver dysfunction. We reviewed the false negative rate (0.4%), and that 3% of patients with metastatic disease will not have it detected by SLN biopsy in endometrial cancer. A low risk of allergic reaction to the dye, <0.2% for ICG, has been reported. We also discussed that in the case of failed mapping, which occurs 40% of the time, a bilateral or unilateral lymphadenectomy will be performed at the surgeon's discretion.   Potential benefits of sentinel nodes including a higher detection rate for metastasis due to ultrastaging and potential  reduction in operative morbidity. However, there remains uncertainty as to the role for treatment of micrometastatic disease. Further, the benefit of operative morbidity associated with the SLN technique in endometrial cancer is not yet completely known. In other patient populations (e.g. the cervical cancer population) there has been observed reductions in morbidity with SLN biopsy compared to pelvic lymphadenectomy. Lymphedema, nerve dysfunction and lymphocysts are all potential risks with the SLN technique as with complete lymphadenectomy. Additional risks to the patient include the risk of damage to an internal organ while operating in an altered view (e.g. the black and white image of the robotic fluorescence imaging mode).   We discussed the plan for a robotic assisted hysterectomy, bilateral salpingo-oophorectomy, sentinel lymph node evaluation, possible lymph node dissection, possible laparotomy. The risks of surgery were discussed in detail and she understands these to include infection; wound separation; hernia; vaginal cuff separation, injury to adjacent organs such as bowel, bladder, blood vessels, ureters and nerves; bleeding which may require blood transfusion; anesthesia risk; thromboembolic events; possible death; unforeseen complications; possible need for re-exploration; medical complications such as heart attack, stroke, pleural effusion and pneumonia; and, if full lymphadenectomy is performed the risk of lymphedema and lymphocyst. The patient will receive DVT and antibiotic prophylaxis as indicated. She voiced a clear understanding. She had the opportunity to ask questions. Perioperative instructions were reviewed with her. Prescriptions for post-op medications were sent to her pharmacy of choice.  We will reach out to PCP, pulmonologist and cardiologist for clearance.  Given her ventral hernia, will try to coordinate having one of general surgeons available in the event that a more complex  hernia repair is required.  Discussed recommendation to start MiraLAX  for her constipation.  A copy of this note was sent  to the patient's referring provider.   80 minutes of total time was spent for this patient encounter, including preparation, face-to-face counseling with the patient and coordination of care, and documentation of the encounter.  Comer Dollar, MD  Division of Gynecologic Oncology  Department of Obstetrics and Gynecology  [facility]  ___________________________________________  Chief Complaint: Chief Complaint  Patient presents with   endometrial cancer    History of Present Illness:  [name] is a 78 y.o. y.o. female who is seen in consultation at the request of Dr. Okey for an evaluation of endometrial cancer.  Incidental finding on CT scan in February showed thickened endometrium.  Her scan also showed cholelithiasis, diverticulosis, and nonobstructing right lower pole kidney stone.  At that time, patient denied any postmenopausal bleeding.  She was having some right lower quadrant pain radiating down to her hip. Pelvic ultrasound on 09/26/2023 showed uterus measuring 9.7 x 5.5 x 6.3 with an endometrial lining thickened at 33.4 mm.  Adnexa not identified but no cystic or solid masses noted. Hysteroscopy with endometrial sampling was performed on 7/31 with findings of blood and clots within the endometrial cavity limiting visualization.  Copious seedlike endometrium and clot were removed with sharp curettage. Final pathology from biopsy showed FIGO grade 2 endometrioid endometrial adenocarcinoma.  She endorses some heavy bleeding after the procedure which stopped as of yesterday.  She endorses constipation, worsens her surgery.  Has been taking Colace without much relief.  Endorses frequent flatus.  Sometimes has pain and feels a knot along the midline of her abdomen, especially when she lays back.  Endorses 2 to 3 months of  intermittent pain in her right pelvis that radiates down her thigh.  Last A1c on 02/28/24 was 6.1%.  She endorses history of OSA, uses a CPAP.  Has shortness of breath with climbing stairs, denies significant shortness of breath with ambulation otherwise.  She had ER/PR positive breast cancer diagnosed in 2013 treated with surgery, adjuvant radiation, and 5 years of tamoxifen .  The patient surgical history is notable for an appendectomy.  This was done through a paramedian incision.  Given rupture, postoperative drain was placed.  She is retired. Lives in [place] with her husband.  PAST MEDICAL HISTORY:  Past Medical History:  Diagnosis Date   Anemia    Anxiety    Chronic fatigue syndrome    Chronic low back pain    followed by dr jinny. mavis   CKD (chronic kidney disease), stage III (HCC)    followed by pcp   Constipation 02/20/2024   02-21-2024  pt stated viist w/ PCP due to constipation given stool softner and abdominal xray done   Coronary artery calcification 05/2022   cardiologist--- dr s. michele;   CT 11/ 2023 ;  CCTA 09-04-2022  calcium  score=36.9 involving LAD w/ mild nonob dz;  NUC 08-14-2022  LR w/ mild perfusion defect reversible, nuclear ef 60%   DDD (degenerative disc disease), lumbar    Depression    Diverticulosis of colon    DOE (dyspnea on exertion)    02-21-2024  sob w/ stairs/ long distance walking/  okay with household chores ,  per cardiologist note 09/2023  chronic DOE stable   Edema of both lower extremities    02-21-2024 pt stated left > right   Flat foot, acquired    History of adenomatous polyp of colon    GI-- dr shila   History of gout    History of left breast  cancer 2013   oncologist--- dr layla (lov 09-06-2017 released w/ no recurrence) ;  08-30-2011  s/p left lumpectomy w/ node dissection;  Stage IAG1, IDC, ER/PR+, nodes negative;  completed IMRT 11-24-2011 ,  completed tamoxifen  02/ 2019   Hyperlipidemia    Hypertension    Lumbar stenosis  with neurogenic claudication    per pt left leg   OA (osteoarthritis)    OSA on CPAP 07/2004   followed by [facility]--- dr jude;   sleep study in epic 08-05-2004 severe osa, (02-21-2024 pt stated uses cpap nightly)   Peripheral neuropathy    Personal history of radiation therapy 09/2011   left breast/ chest wall   10-29-2011  to  11-24-2011  6100 cGy   Rash    02-21-2024  pt stated has rash under abd fold and between thighs   Sickle cell trait (HCC)    Thickened endometrium    Tinnitus of both ears    Type 2 diabetes mellitus (HCC)    followed by pcp:   (02-21-2024  pt stated checks  blood sugar 2 times montly , not fasting)   Wears partial dentures    upper     PAST SURGICAL HISTORY:  Past Surgical History:  Procedure Laterality Date   APPENDECTOMY  1978   BIOPSY  10/06/2021   Procedure: BIOPSY;  Surgeon: Shila Gustav GAILS, MD;  Location: [facility];  Service: Endoscopy;;   BLEPHAROPLASTY Right    age 24s---  right upper eyelid   BREAST LUMPECTOMY WITH NEEDLE LOCALIZATION Right 02/06/2013   Procedure: RIGHT BREAST LUMPECTOMY WITH NEEDLE LOCALIZATION;  Surgeon: Vicenta DELENA Poli, MD;  Location: [facility] OR;  Service: General;  Laterality: Right;   BREAST LUMPECTOMY WITH NEEDLE LOCALIZATION AND AXILLARY SENTINEL LYMPH NODE BX Left 08/30/2011   @[facility]  by dr d. poli;   CATARACT EXTRACTION W/ INTRAOCULAR LENS IMPLANT Bilateral 2018   COLONOSCOPY WITH PROPOFOL  N/A 10/27/2015   Procedure: COLONOSCOPY WITH PROPOFOL ;  Surgeon: Gustav Shila GAILS, MD;  Location: [facility];  Service: Endoscopy;  Laterality: N/A;   COLONOSCOPY WITH PROPOFOL  N/A 10/06/2021   Procedure: COLONOSCOPY WITH PROPOFOL ;  Surgeon: Shila Gustav GAILS, MD;  Location: [facility];  Service: Endoscopy;  Laterality: N/A;   DILATATION & CURRETTAGE/HYSTEROSCOPY WITH RESECTOCOPE N/A 02/28/2024   Procedure: DILATATION & CURETTAGE/HYSTEROSCOPY;  Surgeon: Okey Leader, MD;  Location: [facility] OR;  Service: Gynecology;  Laterality:  N/A;   HARDWARE REMOVAL Left 04/20/2015   Procedure: HARDWARE REMOVAL LEFT LUMBAR FIVE SCREW;  Surgeon: Darina Boehringer, MD;  Location: [facility];  Service: Neurosurgery;  Laterality: Left;   HYSTEROSCOPY WITH D & C  10/04/2005   @[facility]  by dr a. okey;  polypectomy   POLYPECTOMY  10/06/2021   Procedure: POLYPECTOMY;  Surgeon: Shila Gustav GAILS, MD;  Location: [facility];  Service: Endoscopy;;   POSTERIOR FUSION LUMBAR SPINE  04/19/2020   @[facility]  by dr jonelle. kritzer:  Laminectomy / Decompression  L2--3 /  interbody fusion w/ segmental instrumentation L2--L5   POSTERIOR LUMBAR FUSION 2 LEVEL  12/29/2014   @[facility]  by dr jonelle. kritzer;   L3--4  and L4--5   THYROID  CYST EXCISION  1994   benign   TOTAL KNEE ARTHROPLASTY Left 08/19/2004   @[facility]  by dr r. gioffre   TOTAL KNEE ARTHROPLASTY Right 05/24/2005   @[facility]  by dr r. gioffre   TRANSPHENOIDAL PITUITARY RESECTION  01/25/2010   @[facility]  by dr r. kritzer/ dr c. 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    OB/GYN HISTORY:  OB History  Gravida Para Term Preterm AB Living  2 2 2   2   SAB IAB Ectopic Multiple Live Births      2    # Outcome Date GA Lbr Len/2nd Weight Sex Type Anes PTL Lv  2 Term     F      1 Term     M        No LMP recorded. Patient is postmenopausal.  Age at menarche: 44 Age at menopause: 61 Hx of HRT: denies Hx of STDs: denies Last pap: 2015 History of abnormal pap smears: denies  SCREENING STUDIES:  Last mammogram: 2025  Last colonoscopy: 2023 Last bone mineral density: 2025  MEDICATIONS: Outpatient Encounter Medications as of 03/07/2024  Medication Sig   acetaminophen  (TYLENOL ) 500 MG tablet Take 1,000 mg by mouth 2 (two) times daily as needed for headache.   aspirin  EC 81 MG tablet Take 81 mg by mouth daily. Swallow whole.   atorvastatin  (LIPITOR) 20 MG tablet TAKE 1 TABLET(20 MG) BY MOUTH AT BEDTIME   blood glucose meter kit and supplies KIT Dispense based on patient and insurance preference. Use up  to four times daily as directed. (FOR ICD-9 250.00, 250.01).   Cholecalciferol  (VITAMIN D -3) 25 MCG (1000 UT) CAPS Take 1 capsule by mouth daily.   clotrimazole-betamethasone  (LOTRISONE) cream Apply 1 Application topically 2 (two) times daily.   docusate sodium  (COLACE) 100 MG capsule Take 1-3 capsules by mouth daily.   escitalopram  (LEXAPRO ) 20 MG tablet Take 20 mg by mouth daily.   ezetimibe  (ZETIA ) 10 MG tablet Take 10 mg by mouth at bedtime.   Glycerin-Polysorbate 80 (REFRESH DRY EYE THERAPY OP) Apply 1 drop to eye in the morning and at bedtime.   hydrALAZINE (APRESOLINE) 25 MG tablet Take 25 mg by mouth daily.   hydrochlorothiazide  (HYDRODIURIL ) 25 MG tablet Take 1 tablet (25 mg total) by mouth daily.   HYDROcodone -acetaminophen  (NORCO/VICODIN) 5-325 MG tablet Take 1-2 tablets by mouth every 6 (six) hours as needed.   hydrOXYzine (ATARAX) 25 MG tablet Take 25 mg by mouth 2 (two) times daily as needed for itching or anxiety.   irbesartan  (AVAPRO ) 300 MG tablet Take 1 tablet (300 mg total) by mouth at bedtime.   metFORMIN  (GLUCOPHAGE ) 500 MG tablet Take 1 tablet (500 mg total) by mouth daily with breakfast.   nystatin powder Apply 1 Application topically 2 (two) times daily.   verapamil  (VERELAN  PM) 240 MG 24 hr capsule Take 240 mg by mouth at bedtime.   metoCLOPramide  (REGLAN ) 10 MG tablet Take 1 tablet (10 mg total) by mouth 3 (three) times daily as needed for up to 15 doses for nausea. (Patient not taking: Reported on 02/21/2024)   [DISCONTINUED] empagliflozin  (JARDIANCE ) 10 MG TABS tablet Take 1 tablet (10 mg total) by mouth daily. (Patient not taking: Reported on 02/21/2024)   [DISCONTINUED] ergocalciferol  (VITAMIN D2) 1.25 MG (50000 UT) capsule Take 50,000 Units by mouth once a week.   [DISCONTINUED] HYDROcodone -acetaminophen  (NORCO/VICODIN) 5-325 MG tablet Take 1 tablet by mouth every 6 (six) hours as needed for moderate pain (pain score 4-6).   No facility-administered encounter  medications on file as of 03/07/2024.    ALLERGIES:  Allergies  Allergen Reactions   Lyrica  [Pregabalin ]     Dizziness   Cymbalta [Duloxetine Hcl] Other (See Comments)    Stomach pain   Gabapentin  Itching   Lisinopril  Swelling   Maxzide [Hydrochlorothiazide -Triamterene] Other (See Comments)    Cramping    Niacin   Itching   Pravastatin Sodium Other (See Comments)    Headache    Simvastatin  Other (See Comments)    Memory loss   Lodine [Etodolac] Itching     FAMILY HISTORY:  Family History  Problem Relation Age of Onset   Stroke Mother    Diabetes Mother    Hypertension Mother    Hyperlipidemia Mother    Obesity Mother    Heart disease Father    Clotting disorder Father    Stroke Father    Hypertension Father    Heart Problems Father    Breast cancer Sister 29   Colon cancer Neg Hx    Neuropathy Neg Hx      SOCIAL HISTORY:  Social Connections: Not on file    REVIEW OF SYSTEMS:  + Abdominal pain, leg swelling, ringing in ears, constipation, frequency, joint pain, back pain, itching, problems with walking Denies appetite changes, fevers, chills, fatigue, unexplained weight changes. Denies hearing loss, neck lumps or masses, mouth sores,  voice changes. Denies cough or wheezing.  Denies shortness of breath. Denies chest pain or palpitations. Denies abdominal distention, blood in stools, diarrhea, nausea, vomiting, or early satiety. Denies pain with intercourse, dysuria, hematuria or incontinence. Denies hot flashes, pelvic pain, vaginal bleeding or vaginal discharge.   Denies muscle pain/cramps. Denies rash, or wounds. Denies dizziness, headaches, numbness or seizures. Denies swollen lymph nodes or glands, denies easy bruising or bleeding. Denies anxiety, depression, confusion, or decreased concentration.  Physical Exam:  Vital Signs for this encounter:  Blood pressure (!) 145/78, pulse 65, temperature 98.2 F (36.8 C), temperature source Oral, resp. rate 20,  height 5' 6 (1.676 m), weight 276 lb (125.2 kg), SpO2 100%. Body mass index is 44.55 kg/m. General: Alert, oriented, no acute distress.  HEENT: Normocephalic, atraumatic. Sclera anicteric.  Chest: Clear to auscultation bilaterally. No wheezes, rhonchi, or rales. Cardiovascular: Regular rate and rhythm, no murmurs, rubs, or gallops.  Abdomen: Obese. Normoactive bowel sounds. Soft, nondistended, nontender to palpation. No masses or hepatosplenomegaly appreciated. No palpable fluid wave.  Large paramedian incision, well-healed with hernia, lateral healed incision from prior drain. Extremities: Grossly normal range of motion. Warm, well perfused. No edema bilaterally.  Skin: No rashes or lesions.  Lymphatics: No cervical, supraclavicular, or inguinal adenopathy.  GU:  Normal external female genitalia. No lesions. No discharge or bleeding.             Bladder/urethra:  No lesions or masses.             Vagina: Mildly atrophic, no lesions.             Cervix: Normal appearing, no lesions.  Prolapse of the cervix is seen almost to the level of the introitus.             Uterus: Mobile, no parametrial involvement or nodularity.             Adnexa: No masses appreciated.  Rectal: Deferred.  LABORATORY AND RADIOLOGIC DATA:  Outside medical records were reviewed to synthesize the above history, along with the history and physical obtained during the visit.   Lab Results  Component Value Date   WBC 8.0 02/28/2024   HGB 10.9 (L) 02/28/2024   HCT 34.0 (L) 02/28/2024   PLT 274 02/28/2024   GLUCOSE 95 02/28/2024   CHOL 152 10/02/2022   TRIG 97 10/02/2022   HDL 45 10/02/2022   LDLDIRECT 87 10/02/2022   LDLCALC 89 10/02/2022   ALT 14 10/27/2023   AST  22 10/27/2023   NA 141 02/28/2024   K 3.9 02/28/2024   CL 111 02/28/2024   CREATININE 1.21 (H) 02/28/2024   BUN 13 02/28/2024   CO2 21 (L) 02/28/2024   TSH 1.710 08/31/2016   HGBA1C 6.1 (H) 02/28/2024

## 2024-03-07 ENCOUNTER — Inpatient Hospital Stay: Admitting: Oncology

## 2024-03-07 ENCOUNTER — Inpatient Hospital Stay: Attending: Gynecologic Oncology | Admitting: Gynecologic Oncology

## 2024-03-07 ENCOUNTER — Other Ambulatory Visit: Payer: Self-pay

## 2024-03-07 ENCOUNTER — Encounter: Payer: Self-pay | Admitting: Gynecologic Oncology

## 2024-03-07 VITALS — BP 145/78 | HR 65 | Temp 98.2°F | Resp 20 | Ht 66.0 in | Wt 276.0 lb

## 2024-03-07 DIAGNOSIS — G4733 Obstructive sleep apnea (adult) (pediatric): Secondary | ICD-10-CM | POA: Diagnosis not present

## 2024-03-07 DIAGNOSIS — Z170 Estrogen receptor positive status [ER+]: Secondary | ICD-10-CM

## 2024-03-07 DIAGNOSIS — C50412 Malignant neoplasm of upper-outer quadrant of left female breast: Secondary | ICD-10-CM

## 2024-03-07 DIAGNOSIS — E66813 Obesity, class 3: Secondary | ICD-10-CM

## 2024-03-07 DIAGNOSIS — C541 Malignant neoplasm of endometrium: Secondary | ICD-10-CM

## 2024-03-07 NOTE — Progress Notes (Signed)
 Patient here for a consult with Dr. Viktoria for a pre-operative appointment prior to her scheduled surgery on 04/03/2024. She is scheduled for a robotic assisted total laparoscopic hysterectomy, bilateral salpingo-oophorectomy, sentinel lymph node biopsy, possible lymph node dissection, possible laparotomy, possible hernia repair. The surgery was discussed in detail.  See after visit summary for additional details.    Discussed post-op pain management in detail including the aspects of the enhanced recovery pathway.  Advised her that a new prescription would be sent in and it is only to be used for after her upcoming surgery.  We discussed the use of tylenol  post-op and to monitor for a maximum of 4,000 mg in a 24 hour period.  Also prescribed sennakot to be used after surgery and to hold if having loose stools.  Discussed bowel regimen in detail.     Discussed the use of SCDs and measures to take at home to prevent DVT including frequent mobility.  Reportable signs and symptoms of DVT discussed. Post-operative instructions discussed and expectations for after surgery. Incisional care discussed as well including reportable signs and symptoms including erythema, drainage, wound separation.     30 minutes spent with the patient.  Verbalizing understanding of material discussed. No needs or concerns voiced at the end of the visit.   Advised patient to call for any needs.  Advised that her post-operative medications had been prescribed and could be picked up at any time.    This appointment is included in the global surgical bundle as pre-operative teaching and has no charge.

## 2024-03-07 NOTE — Patient Instructions (Signed)
 Preparing for your Surgery  Plan for surgery on April 03, 2024 with Dr. Comer Dollar at [facility]. You will be scheduled for a robotic assisted total laparoscopic hysterectomy (removal of uterus and cervix), bilateral salpingo-oophorectomy (removal of both fallopian tubes and ovaries), sentinel lymph node biopsy, possible lymph node dissection, possible laparotomy (larger incision on your abdomen if needed), possible hernia repair.  Pre-operative Testing -You will receive a phone call from presurgical testing at [facility] to arrange for a pre-operative appointment and lab work.  -Bring your insurance card, copy of an advanced directive if applicable, medication list  -At that visit, you will be asked to sign a consent for a possible blood transfusion in case a transfusion becomes necessary during surgery.  The need for a blood transfusion is rare but having consent is a necessary part of your care.     -You should not be taking blood thinners or aspirin  at least ten days prior to surgery unless instructed by your surgeon.  -Do not take supplements such as fish oil (omega 3), red yeast rice, turmeric before your surgery. STOP TAKING AT LEAST 10 DAYS BEFORE SURGERY. You want to avoid medications with aspirin  in them including headache powders such as BC or Goody's), Excedrin migraine.  -If you are taking a GLP-1 medication/injection such as Ozempic, Mounjaro, [id number], this needs to be held before surgery for at least 7 days before.  Day Before Surgery at Home -You will be asked to take in a light diet the day before surgery. You will be advised you can have clear liquids up until 3 hours before your surgery.    Eat a light diet the day before surgery.  Examples including soups, broths, toast, yogurt, mashed potatoes.  AVOID GAS PRODUCING FOODS AND BEVERAGES. Things to avoid include carbonated beverages (fizzy beverages, sodas), raw fruits and raw vegetables (uncooked),  or beans.   If your bowels are filled with gas, your surgeon will have difficulty visualizing your pelvic organs which increases your surgical risks.  Your role in recovery Your role is to become active as soon as directed by your doctor, while still giving yourself time to heal.  Rest when you feel tired. You will be asked to do the following in order to speed your recovery:  - Cough and breathe deeply. This helps to clear and expand your lungs and can prevent pneumonia after surgery.  - STAY ACTIVE WHEN YOU GET HOME. Do mild physical activity. Walking or moving your legs help your circulation and body functions return to normal. Do not try to get up or walk alone the first time after surgery.   -If you develop swelling on one leg or the other, pain in the back of your leg, redness/warmth in one of your legs, please call the office or go to the Emergency Room to have a doppler to rule out a blood clot. For shortness of breath, chest pain-seek care in the Emergency Room as soon as possible. - Actively manage your pain. Managing your pain lets you move in comfort. We will ask you to rate your pain on a scale of zero to 10. It is your responsibility to tell your doctor or nurse where and how much you hurt so your pain can be treated.  Special Considerations -If you are diabetic, you may be placed on insulin  after surgery to have closer control over your blood sugars to promote healing and recovery.  This does not mean that you will be  discharged on insulin .  If applicable, your oral antidiabetics will be resumed when you are tolerating a solid diet.  -Your final pathology results from surgery should be available around one week after surgery and the results will be relayed to you when available.  -FMLA forms can be faxed to [phone] and please allow 5-7 business days for completion.  Pain Management After Surgery -You will be prescribed your pain medication and bowel regimen medications before  surgery so that you can have these available when you are discharged from the hospital. The pain medication is for use ONLY AFTER surgery and a new prescription will not be given.   -Make sure that you have Tylenol  and Ibuprofen IF YOU ARE ABLE TO TAKE THESE MEDICATIONS at home to use on a regular basis after surgery for pain control. We recommend alternating the medications every hour to six hours since they work differently and are processed in the body differently for pain relief.  -Review the attached handout on narcotic use and their risks and side effects.   Bowel Regimen -You will be prescribed Sennakot-S to take nightly to prevent constipation especially if you are taking the narcotic pain medication intermittently.  It is important to prevent constipation and drink adequate amounts of liquids. You can stop taking this medication when you are not taking pain medication and you are back on your normal bowel routine.  Risks of Surgery Risks of surgery are low but include bleeding, infection, damage to surrounding structures, re-operation, blood clots, and very rarely death.   Blood Transfusion Information (For the consent to be signed before surgery)  We will be checking your blood type before surgery so in case of emergencies, we will know what type of blood you would need.                                            WHAT IS A BLOOD TRANSFUSION?  A transfusion is the replacement of blood or some of its parts. Blood is made up of multiple cells which provide different functions. Red blood cells carry oxygen and are used for blood loss replacement. White blood cells fight against infection. Platelets control bleeding. Plasma helps clot blood. Other blood products are available for specialized needs, such as hemophilia or other clotting disorders. BEFORE THE TRANSFUSION  Who gives blood for transfusions?  You may be able to donate blood to be used at a later date on yourself (autologous  donation). Relatives can be asked to donate blood. This is generally not any safer than if you have received blood from a stranger. The same precautions are taken to ensure safety when a relative's blood is donated. Healthy volunteers who are fully evaluated to make sure their blood is safe. This is blood bank blood. Transfusion therapy is the safest it has ever been in the practice of medicine. Before blood is taken from a donor, a complete history is taken to make sure that person has no history of diseases nor engages in risky social behavior (examples are intravenous drug use or sexual activity with multiple partners). The donor's travel history is screened to minimize risk of transmitting infections, such as malaria. The donated blood is tested for signs of infectious diseases, such as HIV and hepatitis. The blood is then tested to be sure it is compatible with you in order to minimize the chance of a  transfusion reaction. If you or a relative donates blood, this is often done in anticipation of surgery and is not appropriate for emergency situations. It takes many days to process the donated blood. RISKS AND COMPLICATIONS Although transfusion therapy is very safe and saves many lives, the main dangers of transfusion include:  Getting an infectious disease. Developing a transfusion reaction. This is an allergic reaction to something in the blood you were given. Every precaution is taken to prevent this. The decision to have a blood transfusion has been considered carefully by your caregiver before blood is given. Blood is not given unless the benefits outweigh the risks.  AFTER SURGERY INSTRUCTIONS  Return to work: 4-6 weeks if applicable  Activity: 1. Be up and out of the bed during the day.  Take a nap if needed.  You may walk up steps but be careful and use the hand rail.  Stair climbing will tire you more than you think, you may need to stop part way and rest.   2. No lifting or straining  for 6 weeks over 10 pounds. No pushing, pulling, straining for 6 weeks.  3. No driving for 4-89 days when the following criteria have been met: Do not drive if you are taking narcotic pain medicine and make sure that your reaction time has returned.   4. You can shower as soon as the next day after surgery. Shower daily.  Use your regular soap and water (not directly on the incision) and pat your incision(s) dry afterwards; don't rub.  No tub baths or submerging your body in water until cleared by your surgeon. If you have the soap that was given to you by pre-surgical testing that was used before surgery, you do not need to use it afterwards because this can irritate your incisions.   5. No sexual activity and nothing in the vagina for 12 weeks.  6. You may experience a small amount of clear drainage from your incisions, which is normal.  If the drainage persists, increases, or changes color please call the office.  7. Do not use creams, lotions, or ointments such as neosporin on your incisions after surgery until advised by your surgeon because they can cause removal of the dermabond glue on your incisions.    8. You may experience vaginal spotting after surgery or when the stitches at the top of the vagina begin to dissolve.  The spotting is normal but if you experience heavy bleeding, call our office.  9. Take Tylenol  or ibuprofen first for pain if you are able to take these medications and only use narcotic pain medication for severe pain not relieved by the Tylenol  or Ibuprofen.  Monitor your Tylenol  intake to a max of 4,000 mg in a 24 hour period. You can alternate these medications after surgery.  Diet: 1. Low sodium Heart Healthy Diet is recommended but you are cleared to resume your normal (before surgery) diet after your procedure.  2. It is safe to use a laxative, such as Miralax  or Colace, if you have difficulty moving your bowels before surgery. You have been prescribed Sennakot-S to  take at bedtime every evening after surgery to keep bowel movements regular and to prevent constipation.    Wound Care: 1. Keep clean and dry.  Shower daily.  Reasons to call the Doctor: Fever - Oral temperature greater than 100.4 degrees Fahrenheit Foul-smelling vaginal discharge Difficulty urinating Nausea and vomiting Increased pain at the site of the incision that is unrelieved with  pain medicine. Difficulty breathing with or without chest pain New calf pain especially if only on one side Sudden, continuing increased vaginal bleeding with or without clots.   Contacts: For questions or concerns you should contact:  Dr. Comer Dollar at [phone]  Eleanor Epps, NP at [phone]  After Hours: call [phone] and have the GYN Oncologist paged/contacted (after 5 pm or on the weekends). You will speak with an after hours RN and let he or she know you have had surgery.  Messages sent via mychart are for non-urgent matters and are not responded to after hours so for urgent needs, please call the after hours number.

## 2024-03-10 ENCOUNTER — Telehealth: Payer: Self-pay

## 2024-03-10 NOTE — Telephone Encounter (Signed)
   Pre-operative Risk Assessment    Patient Name: [name]  DOB: 1946/05/10 MRN: [id number]   Date of last office visit: 09/26/2023, Dr. Madonna Large, DO Date of next office visit: NONE   Request for Surgical Clearance    Procedure:  robotic assisted hysterectomy, bilateral salpingo-oophorectomy, sentinel lymph node evaluation, possible lymph node dissection, possible laparotomy   Date of Surgery:  Clearance 04/03/24                                Surgeon: Dr. Comer Dollar Surgeon's Group or Practice Name: [facility]  Phone number: [phone] Fax number: [phone]   Type of Clearance Requested:   - Medical  - Pharmacy:  Hold Aspirin      Type of Anesthesia:  General    Additional requests/questions:    SignedAsberry KANDICE Dunning   03/10/2024, 10:50 AM

## 2024-03-10 NOTE — Telephone Encounter (Signed)
 Faxed the following clearnaces::  PCP/Medical: Dr Elsie Lesches [phone]  Pulmonology: Dr Jude  [phone]  Cardiology: Madonna Large (DO):  [phone]

## 2024-03-11 ENCOUNTER — Telehealth (HOSPITAL_BASED_OUTPATIENT_CLINIC_OR_DEPARTMENT_OTHER): Payer: Self-pay

## 2024-03-11 ENCOUNTER — Telehealth: Payer: Self-pay

## 2024-03-11 NOTE — Telephone Encounter (Signed)
  Patient Consent for Virtual Visit        [name] has provided verbal consent on 03/11/2024 for a virtual visit (video or telephone).   CONSENT FOR VIRTUAL VISIT FOR:  [name]  By participating in this virtual visit I agree to the following:  I hereby voluntarily request, consent and authorize [facility] and its employed or contracted physicians, physician assistants, nurse practitioners or other licensed health care professionals (the Practitioner), to provide me with telemedicine health care services (the "Services) as deemed necessary by the treating Practitioner. I acknowledge and consent to receive the Services by the Practitioner via telemedicine. I understand that the telemedicine visit will involve communicating with the Practitioner through live audiovisual communication technology and the disclosure of certain medical information by electronic transmission. I acknowledge that I have been given the opportunity to request an in-person assessment or other available alternative prior to the telemedicine visit and am voluntarily participating in the telemedicine visit.  I understand that I have the right to withhold or withdraw my consent to the use of telemedicine in the course of my care at any time, without affecting my right to future care or treatment, and that the Practitioner or I may terminate the telemedicine visit at any time. I understand that I have the right to inspect all information obtained and/or recorded in the course of the telemedicine visit and may receive copies of available information for a reasonable fee.  I understand that some of the potential risks of receiving the Services via telemedicine include:  Delay or interruption in medical evaluation due to technological equipment failure or disruption; Information transmitted may not be sufficient (e.g. poor resolution of images) to allow for appropriate medical decision making by the  Practitioner; and/or  In rare instances, security protocols could fail, causing a breach of personal health information.  Furthermore, I acknowledge that it is my responsibility to provide information about my medical history, conditions and care that is complete and accurate to the best of my ability. I acknowledge that Practitioner's advice, recommendations, and/or decision may be based on factors not within their control, such as incomplete or inaccurate data provided by me or distortions of diagnostic images or specimens that may result from electronic transmissions. I understand that the practice of medicine is not an exact science and that Practitioner makes no warranties or guarantees regarding treatment outcomes. I acknowledge that a copy of this consent can be made available to me via my patient portal [facility] MyChart), or I can request a printed copy by calling the office of [facility].    I understand that my insurance will be billed for this visit.   I have read or had this consent read to me. I understand the contents of this consent, which adequately explains the benefits and risks of the Services being provided via telemedicine.  I have been provided ample opportunity to ask questions regarding this consent and the Services and have had my questions answered to my satisfaction. I give my informed consent for the services to be provided through the use of telemedicine in my medical care

## 2024-03-11 NOTE — Telephone Encounter (Signed)
   Name: [name]  DOB: 1945/09/16  MRN: [id number]  Primary Cardiologist: Madonna Large, DO Last OV 09/26/23  Preoperative team, please contact this patient and set up a phone call appointment for further preoperative risk assessment. Please obtain consent and complete medication review. Thank you for your help.  I confirm that guidance regarding antiplatelet and oral anticoagulation therapy has been completed and, if necessary, noted below.  Regarding ASA therapy, we recommend continuation of ASA throughout the perioperative period.  However, if the surgeon feels that cessation of ASA is required in the perioperative period, it may be stopped 5-7 days prior to surgery with a plan to resume it as soon as felt to be feasible from a surgical standpoint in the post-operative period.   I also confirmed the patient resides in the state of [place] . As per [facility] Medical Board telemedicine laws, the patient must reside in the state in which the provider is licensed.  Jaggar Benko D Brenner Visconti, NP 03/11/2024, 8:08 AM [facility]

## 2024-03-11 NOTE — Telephone Encounter (Signed)
 Preop tele appt now scheduled, med rec and consent done.

## 2024-03-11 NOTE — Telephone Encounter (Signed)
 Fax received from Dr. Comer Dollar with [facility] to perform a hysterectomy, bilateral salpingo-oophorectomy, sentinal lymph node evaluation, possible lymph node dissection, possible laparotomy on patient under general anesthesia  Patient needs surgery clearance. Surgery is 04/03/24 . Patient was seen on 08/08/23. Office protocol is a risk assessment can be sent to surgeon if patient has been seen in 60 days or less.   Scheduled the pt with [facility] for 03/18/24 and will hold in clearance pool for now.

## 2024-03-12 ENCOUNTER — Telehealth: Payer: Self-pay | Admitting: *Deleted

## 2024-03-12 ENCOUNTER — Encounter: Payer: Self-pay | Admitting: Obstetrics and Gynecology

## 2024-03-12 NOTE — Telephone Encounter (Signed)
 Received PCP clearance.

## 2024-03-13 ENCOUNTER — Telehealth: Payer: Self-pay | Admitting: *Deleted

## 2024-03-13 ENCOUNTER — Other Ambulatory Visit: Payer: Self-pay | Admitting: Gynecologic Oncology

## 2024-03-13 DIAGNOSIS — C541 Malignant neoplasm of endometrium: Secondary | ICD-10-CM

## 2024-03-13 NOTE — Telephone Encounter (Signed)
 Last note entered in error. Received Cardio clearance not PCP

## 2024-03-13 NOTE — Telephone Encounter (Signed)
 LAST TWO NOTES FOR CLEARANCES ENTERED ON WRONG PATIENT

## 2024-03-14 ENCOUNTER — Telehealth: Payer: Self-pay | Admitting: Licensed Clinical Social Worker

## 2024-03-14 ENCOUNTER — Inpatient Hospital Stay: Admitting: Licensed Clinical Social Worker

## 2024-03-14 DIAGNOSIS — B351 Tinea unguium: Secondary | ICD-10-CM | POA: Diagnosis not present

## 2024-03-14 DIAGNOSIS — M792 Neuralgia and neuritis, unspecified: Secondary | ICD-10-CM | POA: Diagnosis not present

## 2024-03-14 DIAGNOSIS — E1151 Type 2 diabetes mellitus with diabetic peripheral angiopathy without gangrene: Secondary | ICD-10-CM | POA: Diagnosis not present

## 2024-03-14 DIAGNOSIS — M21962 Unspecified acquired deformity of left lower leg: Secondary | ICD-10-CM | POA: Diagnosis not present

## 2024-03-14 DIAGNOSIS — M21961 Unspecified acquired deformity of right lower leg: Secondary | ICD-10-CM | POA: Diagnosis not present

## 2024-03-14 DIAGNOSIS — M19072 Primary osteoarthritis, left ankle and foot: Secondary | ICD-10-CM | POA: Diagnosis not present

## 2024-03-14 DIAGNOSIS — I739 Peripheral vascular disease, unspecified: Secondary | ICD-10-CM | POA: Diagnosis not present

## 2024-03-14 DIAGNOSIS — L603 Nail dystrophy: Secondary | ICD-10-CM | POA: Diagnosis not present

## 2024-03-14 NOTE — Progress Notes (Signed)
 [facility] Clinical Social Work  Initial Assessment   [name] is a 78 y.o. year old female contacted by phone. Clinical Social Work was referred by new patient protocol for assessment of psychosocial needs.   SDOH (Social Determinants of Health) assessments performed: Yes- confirmed from 03/06/24   SDOH Screenings   Food Insecurity: No Food Insecurity (03/06/2024)  Housing: Unknown (03/06/2024)  Transportation Needs: No Transportation Needs (03/06/2024)  Utilities: Not At Risk (03/06/2024)  Depression (PHQ2-9): Low Risk  (03/06/2024)  Tobacco Use: Low Risk  (03/07/2024)    PHQ 2/9:    03/06/2024    9:48 AM 08/31/2016    8:34 AM  Depression screen PHQ 2/9  Decreased Interest 0 1  Down, Depressed, Hopeless 0 1  PHQ - 2 Score 0 2  Altered sleeping  1  Tired, decreased energy  3  Change in appetite  0  Feeling bad or failure about yourself   0  Trouble concentrating  2  Moving slowly or fidgety/restless  1  Suicidal thoughts  0   PHQ-9 Score  9     Data saved with a previous flowsheet row definition     Distress Screen completed: No     No data to display            Family/Social Information:  Housing Arrangement: patient lives with her husband Family members/support persons in your life? Family- husband and daughter and Estate agent concerns: no  Employment: Retired .  Income source: Actor concerns: No Type of concern: None Food access concerns: no Religious or spiritual practice: Yes-involved in her church Advanced directives: No Services Currently in place:  [facility] Medicare  Coping/ Adjustment to diagnosis: Patient understands treatment plan and what happens next? yes, preparing for surgery. Feels she is coping well overall, especially since she has less pain since the procedure at the end of July Patient reported stressors: Adjusting to my illness Current coping skills/ strengths: Manufacturing systems engineer , Religious Affiliation , and  Supportive family/friends     SUMMARY: Current SDOH Barriers:  No major barriers identified today  Clinical Social Work Clinical Goal(s):  No clinical social work goals at this time  Interventions: Discussed common feeling and emotions when being diagnosed with cancer, and the importance of support during treatment Informed patient of the support team roles and support services at [facility] Provided CSW contact information and encouraged patient to call with any questions or concerns   Follow Up Plan: Patient will contact CSW with any support or resource needs Patient verbalizes understanding of plan: Yes    [name] Villalva E Tod Abrahamsen, LCSW Clinical Social Worker [facility]

## 2024-03-14 NOTE — Telephone Encounter (Signed)
 [facility] Clinical Social Work  Clinical Social Work was referred by new patient protocol for assessment of psychosocial needs.  Clinical Social Worker attempted to contact patient by phone to offer support and assess for needs.   No answer. Left VM with direct contact information and brief description of support services.     Jerimy Johanson E Carlis Burnsworth, LCSW  Clinical Social Worker [facility]

## 2024-03-18 ENCOUNTER — Encounter (HOSPITAL_BASED_OUTPATIENT_CLINIC_OR_DEPARTMENT_OTHER): Payer: Self-pay

## 2024-03-18 ENCOUNTER — Ambulatory Visit (HOSPITAL_BASED_OUTPATIENT_CLINIC_OR_DEPARTMENT_OTHER)

## 2024-03-18 VITALS — BP 174/74 | HR 78 | Ht 66.0 in | Wt 281.7 lb

## 2024-03-18 DIAGNOSIS — G4733 Obstructive sleep apnea (adult) (pediatric): Secondary | ICD-10-CM | POA: Diagnosis not present

## 2024-03-18 DIAGNOSIS — I10 Essential (primary) hypertension: Secondary | ICD-10-CM

## 2024-03-18 NOTE — Assessment & Plan Note (Signed)
-    Patient is noted to be low risk of postoperative pulmonary complications per the ARISCAT score listed below.  She may require support with BiPAP postoperatively pending a longer operative period.  ARISCAT score: Low risk 1.6% risk of in-hospital post-op pulmonary complications (composite including respiratory failure, respiratory infection, pleural effusion, atelectasis, pneumothorax, bronchospasm, aspiration pneumonitis)  -  Chart indicates history of ILD however most recent chest CT reviewed and does not demonstrate ILD findings.  -She continues to demonstrate benefit from the use of her CPAP machine.  Will order new machine given that hers is more than 80 years old.  Check follow-up download at next appointment.

## 2024-03-18 NOTE — Progress Notes (Signed)
 @Patient  ID: [name], female    DOB: [birth year], 78 y.o.   MRN: [id number]  Chief Complaint  Patient presents with   Follow-up    Surgical Clearance     Referring provider: [name], MD  HPI: [name] is an 78 year old female with past medical history of OSA on CPAP therapy, hypertension, history of left breast cancer, and recent diagnosis of endometrial cancer who presents today for preoperative risk assessment.  Patient offers no complaints; she denies chest pain, dyspnea, fever, chills, cough, chest pain, night sweats, weight loss.  She does report that she has been using her CPAP every night and feels daily benefit from its use.  She also reports that she received a phone call from her DME company stating that the machine should be replaced as it is 80 years old.  Compliance download was reviewed with her today which demonstrates good compliance.  On average she uses the machine over 8 hours per night.  AHI is 10.2 with an acceptable leak profile.  Blood pressure was noted to be elevated at intake today; patient reports she did not take her blood pressure meds this morning.  TEST/EVENTS :  Clinical History: thickened endometrium (cm)  FINAL MICROSCOPIC DIAGNOSIS:  A. ENDOMETRIUM, CURETTAGE: Endometrial adenocarcinoma, endometrioid type with extensive mucinous features, FIGO grade 2 (see comment).  Comment: Sections of the endometrial curettings demonstrate an adenocarcinoma with mixed morphology, including some areas of typical endometrioid morphology, large areas with intracellular mucin, and focal areas with cytoplasmic clearing.  Immunohistochemical stains are performed.  Vimentin shows a mixed pattern with some positive and some negative areas.  P16 and CEAm are patchy positive.  CD10 highlights stroma, and napsin is negative.  These results are most consistent with endometrioid adenocarcinoma with mucinous features. This case was seen in peer review  on 03/04/2024 by Dr. Janel, who concurs with the interpretation. This result was reported to Dr. Okey on 03/04/2024.   Allergies  Allergen Reactions   Lyrica  [Pregabalin ]     Dizziness   Cymbalta [Duloxetine Hcl] Other (See Comments)    Stomach pain   Gabapentin  Itching   Lisinopril  Swelling   Maxzide [Hydrochlorothiazide -Triamterene] Other (See Comments)    Cramping    Niacin  Itching   Pravastatin Sodium Other (See Comments)    Headache    Simvastatin  Other (See Comments)    Memory loss   Lodine [Etodolac] Itching    Immunization History  Administered Date(s) Administered   Influenza Whole 07/01/2011   Influenza, High Dose Seasonal PF 04/27/2023   Influenza-Unspecified 03/31/2016, 02/28/2017, 05/01/2019   PFIZER(Purple Top)SARS-COV-2 Vaccination 10/25/2019, 11/18/2019    Past Medical History:  Diagnosis Date   Anemia    Anxiety    Chronic fatigue syndrome    Chronic low back pain    followed by dr jinny. mavis   CKD (chronic kidney disease), stage III (HCC)    followed by pcp   Constipation 02/20/2024   02-21-2024  pt stated viist w/ PCP due to constipation given stool softner and abdominal xray done   Coronary artery calcification 05/2022   cardiologist--- dr s. michele;   CT 11/ 2023 ;  CCTA 09-04-2022  calcium  score=36.9 involving LAD w/ mild nonob dz;  NUC 08-14-2022  LR w/ mild perfusion defect reversible, nuclear ef 60%   DDD (degenerative disc disease), lumbar    Depression    Diverticulosis of colon    DOE (dyspnea on exertion)    02-21-2024  sob w/ stairs/ long distance  walking/  okay with household chores ,  per cardiologist note 09/2023  chronic DOE stable   Edema of both lower extremities    02-21-2024 pt stated left > right   Flat foot, acquired    History of adenomatous polyp of colon    GI-- dr shila   History of gout    History of left breast cancer 2013   oncologist--- dr layla (lov 09-06-2017 released w/ no recurrence) ;  08-30-2011  s/p  left lumpectomy w/ node dissection;  Stage IAG1, IDC, ER/PR+, nodes negative;  completed IMRT 11-24-2011 ,  completed tamoxifen  02/ 2019   Hyperlipidemia    Hypertension    Lumbar stenosis with neurogenic claudication    per pt left leg   OA (osteoarthritis)    OSA on CPAP 07/2004   followed by [facility]--- dr jude;   sleep study in epic 08-05-2004 severe osa, (02-21-2024 pt stated uses cpap nightly)   Peripheral neuropathy    Personal history of radiation therapy 09/2011   left breast/ chest wall   10-29-2011  to  11-24-2011  6100 cGy   Rash    02-21-2024  pt stated has rash under abd fold and between thighs   Sickle cell trait (HCC)    Thickened endometrium    Tinnitus of both ears    Type 2 diabetes mellitus (HCC)    followed by pcp:   (02-21-2024  pt stated checks  blood sugar 2 times montly , not fasting)   Wears partial dentures    upper    Tobacco History: Social History   Tobacco Use  Smoking Status Never  Smokeless Tobacco Never   Counseling given: Not Answered   Outpatient Medications Prior to Visit  Medication Sig Dispense Refill   acetaminophen  (TYLENOL ) 500 MG tablet Take 1,000 mg by mouth 2 (two) times daily as needed for headache.     aspirin  EC 81 MG tablet Take 81 mg by mouth daily. Swallow whole.     atorvastatin  (LIPITOR) 20 MG tablet TAKE 1 TABLET(20 MG) BY MOUTH AT BEDTIME 90 tablet 3   blood glucose meter kit and supplies KIT Dispense based on patient and insurance preference. Use up to four times daily as directed. (FOR ICD-9 250.00, 250.01). 1 each 0   Cholecalciferol  (VITAMIN D -3) 25 MCG (1000 UT) CAPS Take 1 capsule by mouth daily.     clotrimazole-betamethasone  (LOTRISONE) cream Apply 1 Application topically 2 (two) times daily.     docusate sodium  (COLACE) 100 MG capsule Take 1-3 capsules by mouth daily.     escitalopram  (LEXAPRO ) 20 MG tablet Take 20 mg by mouth daily.     ezetimibe  (ZETIA ) 10 MG tablet Take 10 mg by mouth at bedtime.      Glycerin-Polysorbate 80 (REFRESH DRY EYE THERAPY OP) Apply 1 drop to eye in the morning and at bedtime.     hydrALAZINE (APRESOLINE) 25 MG tablet Take 25 mg by mouth daily.     hydrochlorothiazide  (HYDRODIURIL ) 25 MG tablet Take 1 tablet (25 mg total) by mouth daily. 30 tablet 0   HYDROcodone -acetaminophen  (NORCO/VICODIN) 5-325 MG tablet Take 1-2 tablets by mouth every 6 (six) hours as needed. 10 tablet 0   hydrOXYzine (ATARAX) 25 MG tablet Take 25 mg by mouth 2 (two) times daily as needed for itching or anxiety.     irbesartan  (AVAPRO ) 300 MG tablet Take 1 tablet (300 mg total) by mouth at bedtime. 30 tablet 0   metFORMIN  (GLUCOPHAGE ) 500 MG tablet Take 1  tablet (500 mg total) by mouth daily with breakfast. 90 tablet 0   nystatin powder Apply 1 Application topically 2 (two) times daily.     verapamil  (VERELAN  PM) 240 MG 24 hr capsule Take 240 mg by mouth at bedtime.     No facility-administered medications prior to visit.     Review of Systems:   Constitutional:   No  weight loss, night sweats,  Fevers, chills, fatigue, or  lassitude.  HEENT:   No headaches,  Difficulty swallowing,  Tooth/dental problems, or  Sore throat,                No sneezing, itching, ear ache, nasal congestion, post nasal drip,   CV:  No chest pain,  Orthopnea, PND, swelling in lower extremities, anasarca, dizziness, palpitations, syncope.   GI  No heartburn, indigestion, abdominal pain, nausea, vomiting, diarrhea, change in bowel habits, loss of appetite, bloody stools.   Resp: No shortness of breath with exertion or at rest.  No excess mucus, no productive cough,  No non-productive cough,  No coughing up of blood.  No change in color of mucus.  No wheezing.  No chest wall deformity  Skin: no rash or lesions.  GU: no dysuria, change in color of urine, no urgency or frequency.  No flank pain, no hematuria   MS:  No joint pain or swelling.  No decreased range of motion.  No back pain.    Physical Exam  BP  (!) 174/74   Pulse 78   Ht 5' 6 (1.676 m)   Wt 281 lb 11.2 oz (127.8 kg)   SpO2 96%   BMI 45.47 kg/m   GEN: A/Ox3; pleasant , NAD, well nourished    HEENT:  [place]/AT,  EACs-clear, TMs-wnl, NOSE-clear, THROAT-clear, no lesions, no postnasal drip or exudate noted.  Mallampati 3  NECK:  Supple w/ fair ROM; no JVD; normal carotid impulses w/o bruits; no thyromegaly or nodules palpated; no lymphadenopathy.    RESP  Clear  P & A; w/o, wheezes/ rales/ or rhonchi. no accessory muscle use, no dullness to percussion  CARD:  RRR, no m/r/g, no peripheral edema, pulses intact, no cyanosis or clubbing.  GI:   Soft & nt; nml bowel sounds; no organomegaly or masses detected.   Musco: Warm bil, no deformities or joint swelling noted.   Neuro: alert, no focal deficits noted.    Skin: Warm, no lesions or rashes    Lab Results:  CBC    Component Value Date/Time   WBC 8.0 02/28/2024 1200   RBC 4.32 02/28/2024 1200   HGB 10.9 (L) 02/28/2024 1200   HGB 10.7 (L) 08/30/2017 0736   HGB 11.0 (L) 05/10/2017 1006   HGB 11.1 (L) 06/01/2016 1143   HCT 34.0 (L) 02/28/2024 1200   HCT 34.4 05/10/2017 1006   HCT 34.9 06/01/2016 1143   PLT 274 02/28/2024 1200   PLT 243 08/30/2017 0736   PLT 248 06/01/2016 1143   MCV 78.7 (L) 02/28/2024 1200   MCV 80 05/10/2017 1006   MCV 81.2 06/01/2016 1143   MCH 25.2 (L) 02/28/2024 1200   MCHC 32.1 02/28/2024 1200   RDW 16.8 (H) 02/28/2024 1200   RDW 16.5 (H) 05/10/2017 1006   RDW 15.8 (H) 06/01/2016 1143   LYMPHSABS 1.4 09/17/2023 0938   LYMPHSABS 1.3 05/10/2017 1006   LYMPHSABS 1.8 06/01/2016 1143   MONOABS 0.5 09/17/2023 0938   MONOABS 0.6 06/01/2016 1143   EOSABS 0.1 09/17/2023 0938   EOSABS  0.1 05/10/2017 1006   BASOSABS 0.0 09/17/2023 0938   BASOSABS 0.0 05/10/2017 1006   BASOSABS 0.0 06/01/2016 1143    BMET    Component Value Date/Time   NA 141 02/28/2024 1200   NA 143 10/02/2022 0832   NA 140 06/01/2016 1143   K 3.9 02/28/2024 1200   K 4.0  06/01/2016 1143   CL 111 02/28/2024 1200   CL 105 12/09/2012 1423   CO2 21 (L) 02/28/2024 1200   CO2 26 06/01/2016 1143   GLUCOSE 95 02/28/2024 1200   GLUCOSE 121 06/01/2016 1143   GLUCOSE 177 (H) 12/09/2012 1423   BUN 13 02/28/2024 1200   BUN 15 10/02/2022 0832   BUN 12.5 06/01/2016 1143   CREATININE 1.21 (H) 02/28/2024 1200   CREATININE 1.17 (H) 08/30/2017 0736   CREATININE 1.1 06/01/2016 1143   CALCIUM  10.1 02/28/2024 1200   CALCIUM  10.1 06/01/2016 1143   GFRNONAA 45 (L) 02/28/2024 1200   GFRNONAA 45 (L) 08/30/2017 0736   GFRAA 39 (L) 04/20/2020 0633   GFRAA 52 (L) 08/30/2017 0736    BNP No results found for: BNP  ProBNP    Component Value Date/Time   PROBNP 112.6 02/10/2013 2100    Imaging: No results found.  Administration History     None           No data to display          No results found for: NITRICOXIDE   Assessment & Plan:  Sydne Krahl is an 78 year old female with past medical history of OSA on CPAP therapy who presents today for preoperative risk assessment in the setting of upcoming laparoscopic robotic assisted hysterectomy and oophorectomy for endometrial cancer. Assessment & Plan OSA (obstructive sleep apnea) -  Patient is noted to be low risk of postoperative pulmonary complications per the ARISCAT score listed below.  She may require support with BiPAP postoperatively pending a longer operative period.  ARISCAT score: Low risk 1.6% risk of in-hospital post-op pulmonary complications (composite including respiratory failure, respiratory infection, pleural effusion, atelectasis, pneumothorax, bronchospasm, aspiration pneumonitis)  -  Chart indicates history of ILD however most recent chest CT reviewed and does not demonstrate ILD findings.  -She continues to demonstrate benefit from the use of her CPAP machine.  Will order new machine given that hers is more than 80 years old.  Check follow-up download at next  appointment. Essential hypertension -  Uncontrolled this a.m. after patient did not take her morning meds -  Patient encouraged to take her BP control medications   Return in about 6 months (around 09/18/2024).  Candis Dandy, PA-C 03/18/2024

## 2024-03-18 NOTE — Assessment & Plan Note (Signed)
-    Uncontrolled this a.m. after patient did not take her morning meds -  Patient encouraged to take her BP control medications

## 2024-03-18 NOTE — Patient Instructions (Addendum)
 Low risk for postoperative respiratory complications with Ariscat score of 1.6%- see note for full details.  New CPAP order placed.  Continue CPAP nightly  Return to clinic in 6 months.  Return sooner if new or worsening symptoms.

## 2024-03-19 DIAGNOSIS — K432 Incisional hernia without obstruction or gangrene: Secondary | ICD-10-CM | POA: Diagnosis not present

## 2024-03-19 NOTE — Progress Notes (Signed)
 REFERRING PHYSICIAN:  Viktoria Comer Bidding* PROVIDER:  DEWARD PURCHASE STECHSCHULTE, MD MRN: [id number] DOB: 12-May-1946 DATE OF ENCOUNTER: 03/19/2024 Subjective    Chief Complaint: NEW PROBLEM - discuss hernia repair   History of Present Illness: [name] is a 78 y.o. female who is seen today as an office consultation for evaluation of NEW PROBLEM - discuss hernia repair    History of Present Illness [name] is an 78 year old female who presents with bowel movement issues and a suspected hernia. She was referred by Dr. Viktoria for assistance with endometrial cancer surgery and evaluation of a suspected hernia.  She has experienced bowel movement issues for about a month, characterized by difficulty in effectively using the bathroom. She is currently taking Miralax , which has slightly improved her symptoms. Previously, she was advised to take Colace.  She describes a 'little knot' on her right side, noticeable when lying on her stomach, and feels that everything has shifted over time. She has observed this bulge twice. She has not been previously informed of a hernia diagnosis.  Her surgical history includes an appendectomy in the 1970s and two back surgeries. She notes that she was not constipated after her last back surgery, which was unusual for her post-operative experience. No history of C-sections.     Review of Systems: A complete review of systems was obtained from the patient.  I have reviewed this information and discussed as appropriate with the patient.  See HPI as well for other ROS.  ROS   Medical History: Past Medical History:  Diagnosis Date  . Acute arthritis   . Depression   . Diabetes mellitus without complication (CMS/HHS-HCC)   . High cholesterol   . History of cancer   . Hypertension   . Spinal stenosis, lumbar region, with neurogenic claudication 09/22/2014  . Spondylisthesis 09/22/2014    Patient Active Problem List   Diagnosis  . Spondylisthesis  . Spinal stenosis, lumbar region, with neurogenic claudication    Past Surgical History:  Procedure Laterality Date  . APPENDECTOMY    . breast surgery    . REMOVAL BRAIN TUMOR W/SCOPE N/A      Allergies  Allergen Reactions  . Duloxetine Unknown    Stomach pain  . Pravastatin Sodium Other (See Comments)    Headache  . Triamterene-Hydrochlorothiazid Other (See Comments)    Cramping  . Etodolac Itching    Current Outpatient Medications on File Prior to Visit  Medication Sig Dispense Refill  . acetaminophen  (TYLENOL ) 500 MG tablet Take 1,000 mg by mouth    . aspirin  81 MG EC tablet     . atorvastatin  (LIPITOR) 20 MG tablet TAKE 1 TABLET BY MOUTH 1 TIME A WEEK FOR CHOLESTEROL    . cholecalciferol  (VITAMIN D3) 1,000 unit tablet Take by mouth.    . clopidogrel  (PLAVIX ) 75 mg tablet Take by mouth.    . clotrimazole-betamethasone  (LOTRISONE) 1-0.05 % cream APPLY TOPICALLY TO THE AFFECTED AND SURROUNDING AREAS TWICE DAILY IN THE MORNING AND IN THE EVENING FOR 2 WEEKS    . docusate (COLACE) 100 MG capsule Take 1-3 capsules by mouth    . empagliflozin  (JARDIANCE ) 10 mg tablet     . ergocalciferol , vitamin D2, 1,250 mcg (50,000 unit) capsule Take 50,000 Units by mouth every 7 (seven) days    . escitalopram  oxalate (LEXAPRO ) 20 MG tablet Take 20 mg by mouth once daily    . ezetimibe  (ZETIA ) 10 mg tablet Take 10 mg by mouth  once daily    . hydrALAZINE (APRESOLINE) 25 MG tablet Take 25 mg by mouth once daily    . hydroCHLOROthiazide  (HYDRODIURIL ) 25 MG tablet Take 25 mg by mouth every morning    . HYDROcodone -acetaminophen  (NORCO) 10-325 mg tablet Take by mouth.    . hydrOXYzine (ATARAX) 25 MG tablet TAKE 1 TABLET BY MOUTH TWICE DAILY FOR ITCHING OR ANXIETY    . irbesartan  (AVAPRO ) 300 MG tablet   0  . metFORMIN  (GLUCOPHAGE ) 500 MG tablet TAKE 1 TABLET BY MOUTH EVERY DAY WITH A MEAL    . metoprolol  TARTrate (LOPRESSOR ) 25 MG tablet     . niacin  (NIASPAN )  500 MG ER tablet Take by mouth.    . NUCYNTA  ER 100 mg Tb12   0  . OMEGA-3/DHA/EPA/FISH OIL (FISH OIL-OMEGA-3 FATTY ACIDS) 300-1,000 mg capsule Take by mouth.    . pregabalin  (LYRICA ) 50 MG capsule Take 50 mg by mouth 2 (two) times daily    . verapamil  (CALAN -SR) 240 MG SR tablet     . verapamil  (VERELAN ) 240 MG SR capsule Take by mouth.     No current facility-administered medications on file prior to visit.    Family History  Problem Relation Age of Onset  . Stroke Mother   . Diabetes type II Mother   . Heart disease Father   . Myocardial Infarction (Heart attack) Father   . Breast cancer Sister      Social History   Tobacco Use  Smoking Status Never  Smokeless Tobacco Not on file     Social History   Socioeconomic History  . Marital status: Married  Tobacco Use  . Smoking status: Never  Vaping Use  . Vaping status: Never Used  Substance and Sexual Activity  . Drug use: Never   Social Drivers of Health   Food Insecurity: No Food Insecurity (03/06/2024)   [facility]   Hunger Vital Sign   . Within the past 12 months, you worried that your food would run out before you got the money to buy more.: Never true   . Within the past 12 months, the food you bought just didn't last and you didn't have money to get more.: Never true  Transportation Needs: No Transportation Needs (03/06/2024)   Received from [facility] - Transportation   . In the past 12 months, has lack of transportation kept you from medical appointments or from getting medications?: No   . In the past 12 months, has lack of transportation kept you from meetings, work, or from getting things needed for daily living?: No  Housing Stability: Unknown (03/19/2024)   Housing Stability Vital Sign   . Homeless in the Last Year: No    Objective:   Vitals:   03/19/24 1019  BP: (!) 193/83  Pulse: 92  Temp: 36.8 C (98.2 F)  SpO2: 97%  Weight: (!) 127.9 kg (282 lb)  Height: 167.6 cm  (5' 6)  PainSc: 0-No pain    Body mass index is 45.52 kg/m.  Physical Exam   Abd - previous surgical scars, difficult to palpate fascial defect due to patient's body habitus  Physical Exam   Labs, Imaging and Diagnostic Testing: CT Abd/Pel 09/17/23   IMPRESSION: 1. No acute abdominopelvic findings. 2. Ill-defined appearance of the endometrium, which appears thickened up to 1.9 cm. Recommend further evaluation with nonemergent pelvic ultrasound examination. 3. Cholelithiasis. 4. Punctate nonobstructing right lower pole stone. 5. Colonic diverticulosis without acute diverticulitis. 6.  Aortic Atherosclerosis (ICD10-I70.0).  11.4 cm wide by 3.9 cm incisional hernia from previous appendectomy   Assessment and Plan:     Diagnoses and all orders for this visit:  Incisional hernia, without obstruction or gangrene  Endometrial cancer (CMS/HHS-HCC)  Morbid obesity with body mass index (BMI) of 45.0 to 49.9 in adult (CMS/HHS-HCC)     Assessment & Plan Abdominal wall hernia at prior appendectomy site The hernia is broad-based, reducing the risk of intestinal incarceration. However, scar tissue may be affecting bowel movement. The hernia has been present for a long time, but bowel issues have only recently developed. Differential diagnosis includes scar tissue causing bowel movement issues rather than the hernia itself. - Collaborate with Dr. Viktoria in surgery to address endometrial cancer and assess the hernia. - Break up any scar tissue affecting bowel movement. - Safely reposition intestines from the hernia. - Evaluate the hernia during surgery to determine if repair is necessary. - Consider not repairing the hernia if scar tissue is the primary issue. - If necessary, repair the hernia by closing the muscles with suture and reinforcing with mesh. - Advise purchasing Miralax  for postoperative constipation management.      PAUL JEFFREY STECHSCHULTE, MD    This note has  been created using automated tools and reviewed for accuracy by PAUL JEFFREY STECHSCHULTE.

## 2024-03-20 ENCOUNTER — Telehealth: Payer: Self-pay | Admitting: *Deleted

## 2024-03-20 NOTE — Telephone Encounter (Signed)
Received cardiology clearance

## 2024-03-21 ENCOUNTER — Telehealth: Payer: Self-pay | Admitting: *Deleted

## 2024-03-21 NOTE — Progress Notes (Deleted)
 NEUROLOGY CONSULTATION NOTE  [name] MRN: [id number] DOB: Jul 14, 1946  Referring provider: Elsie JONELLE Lesches, MD Primary care provider: Elsie JONELLE Lesches, MD  Reason for consult:  headache  Assessment/Plan:   ***   Subjective:  [name] is an 78 year old ***-handed female with CKD stage 3, chronic fatigue syndrome, anemia, anxiety, coronary artery calcification, HTN, HLD, chronic back pain with lumbar stenosis, OSA, DM 2, peripheral neuropathy and history of breast cancer who presents for headache.  History supplemented by ED note and referring provider's note.  CTV head personally reviewed.    Onset:  In early March, she fell and hit the right side of her head.  She tripped on the curb and struck her head on a pole.  No loss of consciousness.  Headache began a couple of weeks later.   Location:  right temple, right retro-orbital Quality:  *** Intensity:  ***.  Fluctuates during the day.  Worse when lying down at night and when she wakes up in the morning Aura:  absent Prodrome:  *** Associated symptoms:  ***.  *** denies associated unilateral numbness or weakness. Duration:  *** Frequency:  *** Frequency of abortive medication: *** Triggers:  *** Relieving factors:  *** Activity:  ***  Evaluated in the ED on 10/27/2023.  CTV of head revealed partially empty sella and small left transverse sinus but no acute intracranal abnormality or dural venous sinus thrombosis.  She was treated with a headache cocktail.    Past NSAIDS/analgesics:  etodolac, tramadol Past muscle relaxants:  cyclobenzaprine , tizanidine Past anti-emetic:  metoclopramide  Past antihypertensive medications:  lisinopril , hydrochlorothiazide , furosemide , losartan  Past antidepressant medications:  amitriptyline , bupropion  Past anticonvulsant medications:  gabapentin , pregablin   Current NSAIDS/analgesics:  Tylenol  PRN, ASA 81mg  daily, hydrocodone -acetaminophen  PRN (***) Current Antihypertensive  medications:  verapamil  ER 240mg  daily, irbesartan  Current Antidepressant medications:  Lexapro  20mg  daily Other medications:  hydroxyzine   Caffeine:  *** Alcohol:  *** Smoker:  *** Diet:  *** Exercise:  *** Depression:  ***; Anxiety:  *** Other pain:  *** Sleep hygiene:  ***  History of TBI/concussion:  *** Family history of headache:  *** Family history of cerebral aneurysm:  ***      PAST MEDICAL HISTORY: Past Medical History:  Diagnosis Date   Anemia    Anxiety    Chronic fatigue syndrome    Chronic low back pain    followed by dr jinny. mavis   CKD (chronic kidney disease), stage III (HCC)    followed by pcp   Constipation 02/20/2024   02-21-2024  pt stated viist w/ PCP due to constipation given stool softner and abdominal xray done   Coronary artery calcification 05/2022   cardiologist--- dr s. michele;   CT 11/ 2023 ;  CCTA 09-04-2022  calcium  score=36.9 involving LAD w/ mild nonob dz;  NUC 08-14-2022  LR w/ mild perfusion defect reversible, nuclear ef 60%   DDD (degenerative disc disease), lumbar    Depression    Diverticulosis of colon    DOE (dyspnea on exertion)    02-21-2024  sob w/ stairs/ long distance walking/  okay with household chores ,  per cardiologist note 09/2023  chronic DOE stable   Edema of both lower extremities    02-21-2024 pt stated left > right   Flat foot, acquired    History of adenomatous polyp of colon    GI-- dr shila   History of gout    History of left breast cancer 2013  oncologist--- dr layla (lov 09-06-2017 released w/ no recurrence) ;  08-30-2011  s/p left lumpectomy w/ node dissection;  Stage IAG1, IDC, ER/PR+, nodes negative;  completed IMRT 11-24-2011 ,  completed tamoxifen  02/ 2019   Hyperlipidemia    Hypertension    Lumbar stenosis with neurogenic claudication    per pt left leg   OA (osteoarthritis)    OSA on CPAP 07/2004   followed by [facility]--- dr jude;   sleep study in epic 08-05-2004 severe osa, (02-21-2024 pt  stated uses cpap nightly)   Peripheral neuropathy    Personal history of radiation therapy 09/2011   left breast/ chest wall   10-29-2011  to  11-24-2011  6100 cGy   Rash    02-21-2024  pt stated has rash under abd fold and between thighs   Sickle cell trait (HCC)    Thickened endometrium    Tinnitus of both ears    Type 2 diabetes mellitus (HCC)    followed by pcp:   (02-21-2024  pt stated checks  blood sugar 2 times montly , not fasting)   Wears partial dentures    upper    PAST SURGICAL HISTORY: Past Surgical History:  Procedure Laterality Date   APPENDECTOMY  1978   BIOPSY  10/06/2021   Procedure: BIOPSY;  Surgeon: Shila Gustav GAILS, MD;  Location: [facility];  Service: Endoscopy;;   BLEPHAROPLASTY Right    age 65s---  right upper eyelid   BREAST LUMPECTOMY WITH NEEDLE LOCALIZATION Right 02/06/2013   Procedure: RIGHT BREAST LUMPECTOMY WITH NEEDLE LOCALIZATION;  Surgeon: Vicenta DELENA Poli, MD;  Location: [facility];  Service: General;  Laterality: Right;   BREAST LUMPECTOMY WITH NEEDLE LOCALIZATION AND AXILLARY SENTINEL LYMPH NODE BX Left 08/30/2011   @[facility]  by dr d. poli;   CATARACT EXTRACTION W/ INTRAOCULAR LENS IMPLANT Bilateral 2018   COLONOSCOPY WITH PROPOFOL  N/A 10/27/2015   Procedure: COLONOSCOPY WITH PROPOFOL ;  Surgeon: Gustav Shila GAILS, MD;  Location: [facility];  Service: Endoscopy;  Laterality: N/A;   COLONOSCOPY WITH PROPOFOL  N/A 10/06/2021   Procedure: COLONOSCOPY WITH PROPOFOL ;  Surgeon: Shila Gustav GAILS, MD;  Location: [facility];  Service: Endoscopy;  Laterality: N/A;   DILATATION & CURRETTAGE/HYSTEROSCOPY WITH RESECTOCOPE N/A 02/28/2024   Procedure: DILATATION & CURETTAGE/HYSTEROSCOPY;  Surgeon: Okey Leader, MD;  Location: [facility] OR;  Service: Gynecology;  Laterality: N/A;   HARDWARE REMOVAL Left 04/20/2015   Procedure: HARDWARE REMOVAL LEFT LUMBAR FIVE SCREW;  Surgeon: Darina Boehringer, MD;  Location[facility] OR;  Service: Neurosurgery;  Laterality: Left;    HYSTEROSCOPY WITH D & C  10/04/2005   @[facility]  by dr a. okey;  polypectomy   POLYPECTOMY  10/06/2021   Procedure: POLYPECTOMY;  Surgeon: Shila Gustav GAILS, MD;  Location: [facility];  Service: Endoscopy;;   POSTERIOR FUSION LUMBAR SPINE  04/19/2020   @[facility]  by dr jonelle. kritzer:  Laminectomy / Decompression  L2--3 /  interbody fusion w/ segmental instrumentation L2--L5   POSTERIOR LUMBAR FUSION 2 LEVEL  12/29/2014   @[facility]  by dr jonelle. kritzer;   L3--4  and L4--5   THYROID  CYST EXCISION  1994   benign   TOTAL KNEE ARTHROPLASTY Left 08/19/2004   @[facility]  by dr r. gioffre   TOTAL KNEE ARTHROPLASTY Right 05/24/2005   @[facility]  by dr r. gioffre   TRANSPHENOIDAL PITUITARY RESECTION  01/25/2010   @[facility]  by dr r. kritzer/ dr c. 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    MEDICATIONS: Current Outpatient Medications on File Prior to  Visit  Medication Sig Dispense Refill   acetaminophen  (TYLENOL ) 500 MG tablet Take 1,000 mg by mouth 2 (two) times daily as needed for headache.     aspirin  EC 81 MG tablet Take 81 mg by mouth daily. Swallow whole.     atorvastatin  (LIPITOR) 20 MG tablet TAKE 1 TABLET(20 MG) BY MOUTH AT BEDTIME 90 tablet 3   blood glucose meter kit and supplies KIT Dispense based on patient and insurance preference. Use up to four times daily as directed. (FOR ICD-9 250.00, 250.01). 1 each 0   Cholecalciferol  (VITAMIN D -3) 25 MCG (1000 UT) CAPS Take 1 capsule by mouth daily.     clotrimazole-betamethasone  (LOTRISONE) cream Apply 1 Application topically 2 (two) times daily.     docusate sodium  (COLACE) 100 MG capsule Take 1-3 capsules by mouth daily.     escitalopram  (LEXAPRO ) 20 MG tablet Take 20 mg by mouth daily.     ezetimibe  (ZETIA ) 10 MG tablet Take 10 mg by mouth at bedtime.     Glycerin-Polysorbate 80 (REFRESH DRY EYE THERAPY OP) Apply 1 drop to eye in the morning and at bedtime.     hydrALAZINE (APRESOLINE) 25 MG tablet Take 25 mg by mouth daily.      hydrochlorothiazide  (HYDRODIURIL ) 25 MG tablet Take 1 tablet (25 mg total) by mouth daily. 30 tablet 0   HYDROcodone -acetaminophen  (NORCO/VICODIN) 5-325 MG tablet Take 1-2 tablets by mouth every 6 (six) hours as needed. 10 tablet 0   hydrOXYzine (ATARAX) 25 MG tablet Take 25 mg by mouth 2 (two) times daily as needed for itching or anxiety.     irbesartan  (AVAPRO ) 300 MG tablet Take 1 tablet (300 mg total) by mouth at bedtime. 30 tablet 0   metFORMIN  (GLUCOPHAGE ) 500 MG tablet Take 1 tablet (500 mg total) by mouth daily with breakfast. 90 tablet 0   nystatin powder Apply 1 Application topically 2 (two) times daily.     verapamil  (VERELAN  PM) 240 MG 24 hr capsule Take 240 mg by mouth at bedtime.     No current facility-administered medications on file prior to visit.    ALLERGIES: Allergies  Allergen Reactions   Lyrica  [Pregabalin ]     Dizziness   Cymbalta [Duloxetine Hcl] Other (See Comments)    Stomach pain   Gabapentin  Itching   Lisinopril  Swelling   Maxzide [Hydrochlorothiazide -Triamterene] Other (See Comments)    Cramping    Niacin  Itching   Pravastatin Sodium Other (See Comments)    Headache    Simvastatin  Other (See Comments)    Memory loss   Lodine [Etodolac] Itching    FAMILY HISTORY: Family History  Problem Relation Age of Onset   Stroke Mother    Diabetes Mother    Hypertension Mother    Hyperlipidemia Mother    Obesity Mother    Heart disease Father    Clotting disorder Father    Stroke Father    Hypertension Father    Heart Problems Father    Breast cancer Sister 83   Colon cancer Neg Hx    Neuropathy Neg Hx     Objective:  *** General: No acute distress.  Patient appears well-groomed.   Head:  Normocephalic/atraumatic Eyes:  fundi examined but not visualized Neck: supple, no paraspinal tenderness, full range of motion Heart: regular rate and rhythm Neurological Exam: Mental status: alert and oriented to person, place, and time, speech fluent and  not dysarthric, language intact. Cranial nerves: CN I: not tested CN II: pupils equal, round and  reactive to light, visual fields intact CN III, IV, VI:  full range of motion, no nystagmus, no ptosis CN V: facial sensation intact. CN VII: upper and lower face symmetric CN VIII: hearing intact CN IX, X: gag intact, uvula midline CN XI: sternocleidomastoid and trapezius muscles intact CN XII: tongue midline Bulk & Tone: normal, no fasciculations. Motor:  muscle strength 5/5 throughout Sensation:  Pinprick and vibratory sensation intact. Deep Tendon Reflexes:  2+ throughout,  toes downgoing.   Finger to nose testing:  Without dysmetria.   Gait:  Normal station and stride.  Romberg negative.    Thank you for allowing me to take part in the care of this patient.  Juliene Dunnings, DO  CC: Elsie JONELLE Lesches, MD

## 2024-03-21 NOTE — Telephone Encounter (Signed)
Received pulmonary clearance

## 2024-03-24 ENCOUNTER — Ambulatory Visit: Admitting: Neurology

## 2024-03-25 ENCOUNTER — Telehealth: Payer: Self-pay | Admitting: *Deleted

## 2024-03-25 ENCOUNTER — Ambulatory Visit: Attending: Cardiology | Admitting: Nurse Practitioner

## 2024-03-25 DIAGNOSIS — Z01810 Encounter for preprocedural cardiovascular examination: Secondary | ICD-10-CM

## 2024-03-25 NOTE — Progress Notes (Signed)
 Virtual Visit via Telephone Note   Because of [name] co-morbid illnesses, she is at least at moderate risk for complications without adequate follow up.  This format is felt to be most appropriate for this patient at this time.  Due to technical limitations with video connection (technology), today's appointment will be conducted as an audio only telehealth visit, and [name] verbally agreed to proceed in this manner.   All issues noted in this document were discussed and addressed.  No physical exam could be performed with this format.  Evaluation Performed:  Preoperative cardiovascular risk assessment _____________   Date:  03/25/2024   Patient ID:  [name], DOB [birth year], MRN [id number] Patient Location:  Home Provider location:   Office  Primary Care Provider:  [name], MD Primary Cardiologist:  Madonna Large, DO  Chief Complaint / Patient Profile   78 y.o. y/o female with a h/o coronary artery calcification, aortic atherosclerosis, type 2 diabetes, HTN, HLD, obesity, sleep apnea, [name] who is pending robotic assisted hysterectomy, bilateral salpingo-oophorectomy, sentinel lymph node evaluation, possible lymph node dissection, possible laparotomy with Dr. Viktoria on 04/03/24 and presents today for telephonic preoperative cardiovascular risk assessment.  History of Present Illness    [name] is a 78 y.o. female who presents via audio/video conferencing for a telehealth visit today.  Pt was last seen in cardiology clinic on 09/26/23 by Dr. Large.  At that time [name] was doing well.  The patient is now pending procedure as outlined above. Since her last visit, she  denies chest pain, fatigue, palpitations, melena, hematuria, hemoptysis, diaphoresis, weakness, presyncope, syncope, orthopnea, and PND. She reports shortness of breath with walking up stairs in her home which she has to do daily. She feels this is stable and denies orthopnea,  PND, or chest pain. She has chronic LE Edema that she feels is stable. She is limited by back pain which causes her to have to rest often when participating in moderate activities, but she is able to achieve > 4 METS activity without concerning cardiac symptoms.    Past Medical History    Past Medical History:  Diagnosis Date   Anemia    Anxiety    Chronic fatigue syndrome    Chronic low back pain    followed by dr jinny. mavis   CKD (chronic kidney disease), stage III (HCC)    followed by pcp   Constipation 02/20/2024   02-21-2024  pt stated viist w/ PCP due to constipation given stool softner and abdominal xray done   Coronary artery calcification 05/2022   cardiologist--- dr s. large;   CT 11/ 2023 ;  CCTA 09-04-2022  calcium  score=36.9 involving LAD w/ mild nonob dz;  NUC 08-14-2022  LR w/ mild perfusion defect reversible, nuclear ef 60%   DDD (degenerative disc disease), lumbar    Depression    Diverticulosis of colon    DOE (dyspnea on exertion)    02-21-2024  sob w/ stairs/ long distance walking/  okay with household chores ,  per cardiologist note 09/2023  chronic DOE stable   Edema of both lower extremities    02-21-2024 pt stated left > right   Flat foot, acquired    History of adenomatous polyp of colon    GI-- dr shila   History of gout    History of left breast cancer 2013   oncologist--- dr layla (lov 09-06-2017 released w/ no recurrence) ;  08-30-2011  s/p left lumpectomy w/ node  dissection;  Stage IAG1, IDC, ER/PR+, nodes negative;  completed IMRT 11-24-2011 ,  completed tamoxifen  02/ 2019   Hyperlipidemia    Hypertension    Lumbar stenosis with neurogenic claudication    per pt left leg   OA (osteoarthritis)    OSA on CPAP 07/2004   followed by [facility]--- dr jude;   sleep study in epic 08-05-2004 severe osa, (02-21-2024 pt stated uses cpap nightly)   Peripheral neuropathy    Personal history of radiation therapy 09/2011   left breast/ chest wall   10-29-2011   to  11-24-2011  6100 cGy   Rash    02-21-2024  pt stated has rash under abd fold and between thighs   Sickle cell trait (HCC)    Thickened endometrium    Tinnitus of both ears    Type 2 diabetes mellitus (HCC)    followed by pcp:   (02-21-2024  pt stated checks  blood sugar 2 times montly , not fasting)   Wears partial dentures    upper   Past Surgical History:  Procedure Laterality Date   APPENDECTOMY  1978   BIOPSY  10/06/2021   Procedure: BIOPSY;  Surgeon: Shila Gustav GAILS, MD;  Location: [facility];  Service: Endoscopy;;   BLEPHAROPLASTY Right    age 21s---  right upper eyelid   BREAST LUMPECTOMY WITH NEEDLE LOCALIZATION Right 02/06/2013   Procedure: RIGHT BREAST LUMPECTOMY WITH NEEDLE LOCALIZATION;  Surgeon: Vicenta DELENA Poli, MD;  Location: [facility] OR;  Service: General;  Laterality: Right;   BREAST LUMPECTOMY WITH NEEDLE LOCALIZATION AND AXILLARY SENTINEL LYMPH NODE BX Left 08/30/2011   @[facility]  by dr d. poli;   CATARACT EXTRACTION W/ INTRAOCULAR LENS IMPLANT Bilateral 2018   COLONOSCOPY WITH PROPOFOL  N/A 10/27/2015   Procedure: COLONOSCOPY WITH PROPOFOL ;  Surgeon: Gustav Shila GAILS, MD;  Location: [facility];  Service: Endoscopy;  Laterality: N/A;   COLONOSCOPY WITH PROPOFOL  N/A 10/06/2021   Procedure: COLONOSCOPY WITH PROPOFOL ;  Surgeon: Shila Gustav GAILS, MD;  Location: [facility];  Service: Endoscopy;  Laterality: N/A;   DILATATION & CURRETTAGE/HYSTEROSCOPY WITH RESECTOCOPE N/A 02/28/2024   Procedure: DILATATION & CURETTAGE/HYSTEROSCOPY;  Surgeon: Okey Leader, MD;  [facility]: [facility] OR;  Service: Gynecology;  Laterality: N/A;   HARDWARE REMOVAL Left 04/20/2015   Procedure: HARDWARE REMOVAL LEFT LUMBAR FIVE SCREW;  Surgeon: Darina Boehringer, MD;  Location: [facility] OR;  Service: Neurosurgery;  Laterality: Left;   HYSTEROSCOPY WITH D & C  10/04/2005   @[facility]  by dr a. okey;  polypectomy   POLYPECTOMY  10/06/2021   Procedure: POLYPECTOMY;  Surgeon: Shila Gustav GAILS, MD;  Location:  [facility];  Service: Endoscopy;;   POSTERIOR FUSION LUMBAR SPINE  04/19/2020   @[facility]  by dr jonelle. kritzer:  Laminectomy / Decompression  L2--3 /  interbody fusion w/ segmental instrumentation L2--L5   POSTERIOR LUMBAR FUSION 2 LEVEL  12/29/2014   @[facility]  by dr jonelle. kritzer;   L3--4  and L4--5   THYROID  CYST EXCISION  1994   benign   TOTAL KNEE ARTHROPLASTY Left 08/19/2004   @[facility]  by dr r. gioffre   TOTAL KNEE ARTHROPLASTY Right 05/24/2005   @[facility]  by dr r. gioffre   TRANSPHENOIDAL PITUITARY RESECTION  01/25/2010   @[facility]  by dr r. kritzer/ dr c. 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    Allergies  Allergies  Allergen Reactions   Lyrica  [Pregabalin ]     Dizziness   Cymbalta [Duloxetine Hcl] Other (See Comments)    Stomach pain  Gabapentin  Itching   Lisinopril  Swelling   Maxzide [Hydrochlorothiazide -Triamterene] Other (See Comments)    Cramping    Niacin  Itching   Pravastatin Sodium Other (See Comments)    Headache    Simvastatin  Other (See Comments)    Memory loss   Lodine [Etodolac] Itching    Home Medications    Prior to Admission medications   Medication Sig Start Date End Date Taking? Authorizing Provider  acetaminophen  (TYLENOL ) 500 MG tablet Take 1,000 mg by mouth 2 (two) times daily as needed for headache.    [provider]  aspirin  EC 81 MG tablet Take 81 mg by mouth daily. Swallow whole.    [provider]  atorvastatin  (LIPITOR) 20 MG tablet TAKE 1 TABLET(20 MG) BY MOUTH AT BEDTIME 11/06/23   Tolia, Sunit, DO  blood glucose meter kit and supplies KIT Dispense based on patient and insurance preference. Use up to four times daily as directed. (FOR ICD-9 250.00, 250.01). 06/07/17   Elvie French HERO, PA-C  Cholecalciferol  (VITAMIN D -3) 25 MCG (1000 UT) CAPS Take 1 capsule by mouth daily.    [provider]  clotrimazole-betamethasone  (LOTRISONE) cream Apply 1 Application topically 2 (two) times daily.    [provider]  docusate sodium  (COLACE) 100 MG capsule Take 1-3 capsules by mouth daily.    [provider]  escitalopram  (LEXAPRO ) 20 MG tablet Take 20 mg by mouth daily.    [provider]  ezetimibe  (ZETIA ) 10 MG tablet Take 10 mg by mouth at bedtime. 01/13/19   [provider]  Glycerin-Polysorbate 80 (REFRESH DRY EYE THERAPY OP) Apply 1 drop to eye in the morning and at bedtime.    [provider]  hydrALAZINE (APRESOLINE) 25 MG tablet Take 25 mg by mouth daily.    [provider]  hydrochlorothiazide  (HYDRODIURIL ) 25 MG tablet Take 1 tablet (25 mg total) by mouth daily. 11/24/19   Therisa Arabia, PA-C  HYDROcodone -acetaminophen  (NORCO/VICODIN) 5-325 MG tablet Take 1-2 tablets by mouth every 6 (six) hours as needed. 09/17/23   Jerrol Agent, MD  hydrOXYzine (ATARAX) 25 MG tablet Take 25 mg by mouth 2 (two) times daily as needed for itching or anxiety.    [provider]  irbesartan  (AVAPRO ) 300 MG tablet Take 1 tablet (300 mg total) by mouth at bedtime. 08/23/17   Elvie French HERO, PA-C  metFORMIN  (GLUCOPHAGE ) 500 MG tablet Take 1 tablet (500 mg total) by mouth daily with breakfast. 10/30/19   Therisa Arabia, PA-C  nystatin powder Apply 1 Application topically 2 (two) times daily.    [provider]  verapamil  (VERELAN  PM) 240 MG 24 hr capsule Take 240 mg by mouth at bedtime.    [provider]    Physical Exam    Vital Signs:  [name] does not have vital signs available for review today.  Given telephonic nature of communication, physical exam is limited. AAOx3. NAD. Normal affect.  Speech and respirations are unlabored.  Accessory Clinical Findings    None  Assessment & Plan    1.  Preoperative Cardiovascular Risk Assessment: According to the Revised Cardiac Risk Index (RCRI), her Perioperative Risk of Major Cardiac Event is (%): 0.4. Her Functional Capacity in METs is: 4.73 according to the [facility] Activity  Status Index (DASI). The patient is doing well from a cardiac perspective. Therefore, based on ACC/AHA guidelines, the patient would be at acceptable risk for the planned procedure without further cardiovascular testing.   The patient was advised that  if she develops new symptoms prior to surgery to contact our office to arrange for a follow-up visit, and she verbalized understanding.  Per office protocol, she may hold aspirin  for 5-7 days prior to procedure and should resume as soon as hemodynamically stable postoperatively.   A copy of this note will be routed to requesting surgeon.  Time:   Today, I have spent 10 minutes with the patient with telehealth technology discussing medical history, symptoms, and management plan.     Rosaline EMERSON Bane, NP-C  03/25/2024, 9:09 AM [address] [place], [place] [address] Office [phone] Fax [phone]

## 2024-03-25 NOTE — Telephone Encounter (Signed)
 Spoke with Ms. Lasure to confirm she is taking a daily dose of Aspirin  81 mg. Pt confirms she is. Message relayed from Eleanor Epps, NP that patient is to stop taking her aspirin  5 days prior to her scheduled surgery on Thursday, September 4 th. With Dr. Viktoria and her last dose of her aspirin  will be on Friday, August 29 th in the morning. Pt verbalized understanding and will put this on her calendar. Pt also reminded of her pre-admission appt. On 8/29 at 0900 [facility]. Pt thanked the office for calling.

## 2024-03-25 NOTE — Telephone Encounter (Signed)
Received cardiology clearance

## 2024-03-27 NOTE — Patient Instructions (Signed)
 SURGICAL WAITING ROOM VISITATION Patients having surgery or a procedure may have no more than 2 support people in the waiting area - these visitors may rotate in the visitor waiting room.   Due to an increase in RSV and influenza rates and associated hospitalizations, children ages 48 and under may not visit patients in [facility] hospitals. If the patient needs to stay at the hospital during part of their recovery, the visitor guidelines for inpatient rooms apply.  PRE-OP VISITATION  Pre-op nurse will coordinate an appropriate time for 1 support person to accompany the patient in pre-op.  This support person may not rotate.  This visitor will be contacted when the time is appropriate for the visitor to come back in the pre-op area.  Please refer to the [facility] website for the visitor guidelines for Inpatients (after your surgery is over and you are in a regular room).  You are not required to quarantine at this time prior to your surgery. However, you must do this: Hand Hygiene often Do NOT share personal items Notify your provider if you are in close contact with someone who has COVID or you develop fever 100.4 or greater, new onset of sneezing, cough, sore throat, shortness of breath or body aches.  If you test positive for Covid or have been in contact with anyone that has tested positive in the last 10 days please notify you surgeon.    Your procedure is scheduled on:  04/03/24  Report to [facility] Main Entrance: [place] entrance where the [employer] is available.   Report to admitting at: 10:40 AM  Call this number if you have any questions or problems the morning of surgery [phone]  FOLLOW ANY ADDITIONAL PRE OP INSTRUCTIONS YOU RECEIVED FROM YOUR SURGEON'S OFFICE!!!  Eat a light diet the day before surgery.  Examples including soups, broths, toast, yogurt, mashed potatoes.  Things to avoid include carbonated beverages (fizzy beverages), raw fruits and raw  vegetables, or beans.   If your bowels are filled with gas, your surgeon will have difficulty visualizing your pelvic organs which increases your surgical risks.  Do not eat food after Midnight the night prior to your surgery/procedure.  After Midnight you may have the following liquids until : 9:50 AM DAY OF SURGERY  Clear Liquid Diet Water Black Coffee (sugar ok, NO MILK/CREAM OR CREAMERS)  Tea (sugar ok, NO MILK/CREAM OR CREAMERS) regular and decaf                             Plain Jell-O  with no fruit (NO RED)                                           Fruit ices (not with fruit pulp, NO RED)                                     Popsicles (NO RED)  Juice: NO CITRUS JUICES: only apple, WHITE grape, WHITE cranberry Sports drinks like Gatorade or Powerade (NO RED)    Oral Hygiene is also important to reduce your risk of infection.        Remember - BRUSH YOUR TEETH THE MORNING OF SURGERY WITH YOUR REGULAR TOOTHPASTE  STOP TAKING all Vitamins, Herbs and supplements 1 week before your surgery.   Take ONLY these medicines the morning of surgery with A SIP OF WATER: escitalopram ,hydralazine.Tylenol ,hydroxyzine as needed.  If You have been diagnosed with Sleep Apnea - Bring CPAP mask and tubing day of surgery. We will provide you with a CPAP machine on the day of your surgery.                   You may not have any metal on your body including hair pins, jewelry, and body piercing  Do not wear make-up, lotions, powders, perfumes / cologne, or deodorant  Do not wear nail polish including gel and S&S, artificial / acrylic nails, or any other type of covering on natural nails including finger and toenails. If you have artificial nails, gel coating, etc., that needs to be removed by a nail salon, Please have this removed prior to surgery. Not doing so may mean that your surgery could be cancelled or delayed if the Surgeon or  anesthesia staff feels like they are unable to monitor you safely.   Do not shave 48 hours prior to surgery to avoid nicks in your skin which may contribute to postoperative infections.   Contacts, Hearing Aids, dentures or bridgework may not be worn into surgery. DENTURES WILL BE REMOVED PRIOR TO SURGERY PLEASE DO NOT APPLY Poly grip OR ADHESIVES!!!  You may bring a small overnight bag with you on the day of surgery, only pack items that are not valuable. [facility] IS NOT RESPONSIBLE   FOR VALUABLES THAT ARE LOST OR STOLEN.   Patients discharged on the day of surgery will not be allowed to drive home.  Someone NEEDS to stay with you for the first 24 hours after anesthesia.  Do not bring your home medications to the hospital. The Pharmacy will dispense medications listed on your medication list to you during your admission in the Hospital.  Special Instructions: Bring a copy of your healthcare power of attorney and living will documents the day of surgery, if you wish to have them scanned into your [facility]- EPIC  Please read over the following fact sheets you were given: IF YOU HAVE QUESTIONS ABOUT YOUR PRE-OP INSTRUCTIONS, PLEASE CALL [phone]   [facility] - Preparing for Surgery Before surgery, you can play an important role.  Because skin is not sterile, your skin needs to be as free of germs as possible.  You can reduce the number of germs on your skin by washing with CHG (chlorahexidine gluconate) soap before surgery.  CHG is an antiseptic cleaner which kills germs and bonds with the skin to continue killing germs even after washing. Please DO NOT use if you have an allergy to CHG or antibacterial soaps.  If your skin becomes reddened/irritated stop using the CHG and inform your nurse when you arrive at Short Stay. Do not shave (including legs and underarms) for at least 48 hours prior to the first CHG shower.  You may shave your face/neck.  Please follow these  instructions carefully:  1.  Shower with CHG Soap the night before surgery and the  morning of surgery.  2.  If you choose to wash your hair, wash your hair first as usual with your normal  shampoo.  3.  After you shampoo, rinse your hair and body thoroughly to remove the shampoo.                             4.  Use CHG as you would any other liquid soap.  You can apply chg directly to the skin and wash.  Gently with a scrungie or clean washcloth.  5.  Apply the CHG Soap to your body ONLY FROM THE NECK DOWN.   Do not use on face/ open                           Wound or open sores. Avoid contact with eyes, ears mouth and genitals (private parts).                       Wash face,  Genitals (private parts) with your normal soap.             6.  Wash thoroughly, paying special attention to the area where your  surgery  will be performed.  7.  Thoroughly rinse your body with warm water from the neck down.  8.  DO NOT shower/wash with your normal soap after using and rinsing off the CHG Soap.            9.  Pat yourself dry with a clean towel.            10.  Wear clean pajamas.            11.  Place clean sheets on your bed the night of your first shower and do not  sleep with pets.  ON THE DAY OF SURGERY : Do not apply any lotions/deodorants the morning of surgery.  Please wear clean clothes to the hospital/surgery center.     FAILURE TO FOLLOW THESE INSTRUCTIONS MAY RESULT IN THE CANCELLATION OF YOUR SURGERY  PATIENT SIGNATURE_________________________________  NURSE SIGNATURE__________________________________  ________________________________________________________________________   WHAT IS A BLOOD TRANSFUSION? Blood Transfusion Information  A transfusion is the replacement of blood or some of its parts. Blood is made up of multiple cells which provide different functions. Red blood cells carry oxygen and are used for blood loss replacement. White blood cells fight against  infection. Platelets control bleeding. Plasma helps clot blood. Other blood products are available for specialized needs, such as hemophilia or other clotting disorders. BEFORE THE TRANSFUSION  Who gives blood for transfusions?  Healthy volunteers who are fully evaluated to make sure their blood is safe. This is blood bank blood. Transfusion therapy is the safest it has ever been in the practice of medicine. Before blood is taken from a donor, a complete history is taken to make sure that person has no history of diseases nor engages in risky social behavior (examples are intravenous drug use or sexual activity with multiple partners). The donor's travel history is screened to minimize risk of transmitting infections, such as malaria. The donated blood is tested for signs of infectious diseases, such as HIV and hepatitis. The blood is then tested to be sure it is compatible with you in order to minimize the chance of a transfusion reaction. If you or a relative donates blood, this is often done in anticipation of surgery and is not appropriate for emergency situations. It takes  many days to process the donated blood. RISKS AND COMPLICATIONS Although transfusion therapy is very safe and saves many lives, the main dangers of transfusion include:  Getting an infectious disease. Developing a transfusion reaction. This is an allergic reaction to something in the blood you were given. Every precaution is taken to prevent this. The decision to have a blood transfusion has been considered carefully by your caregiver before blood is given. Blood is not given unless the benefits outweigh the risks. AFTER THE TRANSFUSION Right after receiving a blood transfusion, you will usually feel much better and more energetic. This is especially true if your red blood cells have gotten low (anemic). The transfusion raises the level of the red blood cells which carry oxygen, and this usually causes an energy increase. The  nurse administering the transfusion will monitor you carefully for complications. HOME CARE INSTRUCTIONS  No special instructions are needed after a transfusion. You may find your energy is better. Speak with your caregiver about any limitations on activity for underlying diseases you may have. SEEK MEDICAL CARE IF:  Your condition is not improving after your transfusion. You develop redness or irritation at the intravenous (IV) site. SEEK IMMEDIATE MEDICAL CARE IF:  Any of the following symptoms occur over the next 12 hours: Shaking chills. You have a temperature by mouth above 102 F (38.9 C), not controlled by medicine. Chest, back, or muscle pain. People around you feel you are not acting correctly or are confused. Shortness of breath or difficulty breathing. Dizziness and fainting. You get a rash or develop hives. You have a decrease in urine output. Your urine turns a dark color or changes to pink, red, or brown. Any of the following symptoms occur over the next 10 days: You have a temperature by mouth above 102 F (38.9 C), not controlled by medicine. Shortness of breath. Weakness after normal activity. The white part of the eye turns yellow (jaundice). You have a decrease in the amount of urine or are urinating less often. Your urine turns a dark color or changes to pink, red, or brown. Document Released: 07/14/2000 Document Revised: 10/09/2011 Document Reviewed: 03/02/2008 [facility] Patient Information 2014 [place], [place].  _______________________________________________________________________

## 2024-03-28 ENCOUNTER — Other Ambulatory Visit: Payer: Self-pay

## 2024-03-28 ENCOUNTER — Encounter (HOSPITAL_COMMUNITY): Payer: Self-pay

## 2024-03-28 ENCOUNTER — Encounter (HOSPITAL_COMMUNITY)
Admission: RE | Admit: 2024-03-28 | Discharge: 2024-03-28 | Disposition: A | Discharge status: Home / Self Care | Source: Ambulatory Visit | Attending: Gynecologic Oncology | Admitting: Gynecologic Oncology

## 2024-03-28 VITALS — BP 193/84 | HR 61 | Temp 98.3°F | Resp 18 | Ht 66.0 in | Wt 274.0 lb

## 2024-03-28 DIAGNOSIS — E119 Type 2 diabetes mellitus without complications: Secondary | ICD-10-CM

## 2024-03-28 DIAGNOSIS — E669 Obesity, unspecified: Secondary | ICD-10-CM | POA: Insufficient documentation

## 2024-03-28 DIAGNOSIS — Z01812 Encounter for preprocedural laboratory examination: Secondary | ICD-10-CM | POA: Diagnosis not present

## 2024-03-28 DIAGNOSIS — I129 Hypertensive chronic kidney disease with stage 1 through stage 4 chronic kidney disease, or unspecified chronic kidney disease: Secondary | ICD-10-CM | POA: Diagnosis not present

## 2024-03-28 DIAGNOSIS — E1122 Type 2 diabetes mellitus with diabetic chronic kidney disease: Secondary | ICD-10-CM | POA: Diagnosis not present

## 2024-03-28 DIAGNOSIS — C541 Malignant neoplasm of endometrium: Secondary | ICD-10-CM | POA: Diagnosis not present

## 2024-03-28 DIAGNOSIS — E1140 Type 2 diabetes mellitus with diabetic neuropathy, unspecified: Secondary | ICD-10-CM | POA: Diagnosis not present

## 2024-03-28 DIAGNOSIS — I2510 Atherosclerotic heart disease of native coronary artery without angina pectoris: Secondary | ICD-10-CM | POA: Insufficient documentation

## 2024-03-28 DIAGNOSIS — N183 Chronic kidney disease, stage 3 unspecified: Secondary | ICD-10-CM | POA: Insufficient documentation

## 2024-03-28 DIAGNOSIS — Z7984 Long term (current) use of oral hypoglycemic drugs: Secondary | ICD-10-CM | POA: Diagnosis not present

## 2024-03-28 DIAGNOSIS — G4733 Obstructive sleep apnea (adult) (pediatric): Secondary | ICD-10-CM | POA: Diagnosis not present

## 2024-03-28 DIAGNOSIS — R0609 Other forms of dyspnea: Secondary | ICD-10-CM | POA: Insufficient documentation

## 2024-03-28 HISTORY — DX: Gastro-esophageal reflux disease without esophagitis: K21.9

## 2024-03-28 HISTORY — DX: Personal history of urinary calculi: Z87.442

## 2024-03-28 LAB — COMPREHENSIVE METABOLIC PANEL WITH GFR
ALT: 11 U/L (ref 0–44)
AST: 17 U/L (ref 15–41)
Albumin: 3.9 g/dL (ref 3.5–5.0)
Alkaline Phosphatase: 93 U/L (ref 38–126)
Anion gap: 10 (ref 5–15)
BUN: 12 mg/dL (ref 8–23)
CO2: 23 mmol/L (ref 22–32)
Calcium: 10.5 mg/dL — ABNORMAL HIGH (ref 8.9–10.3)
Chloride: 108 mmol/L (ref 98–111)
Creatinine, Ser: 1.28 mg/dL — ABNORMAL HIGH (ref 0.44–1.00)
GFR, Estimated: 42 mL/min — ABNORMAL LOW (ref >60)
Glucose, Bld: 99 mg/dL (ref 70–99)
Potassium: 4.5 mmol/L (ref 3.5–5.1)
Sodium: 141 mmol/L (ref 135–145)
Total Bilirubin: 0.6 mg/dL (ref 0.0–1.2)
Total Protein: 7.5 g/dL (ref 6.5–8.1)

## 2024-03-28 LAB — CBC
HCT: 34.6 % — ABNORMAL LOW (ref 36.0–46.0)
Hemoglobin: 10.8 g/dL — ABNORMAL LOW (ref 12.0–15.0)
MCH: 25.1 pg — ABNORMAL LOW (ref 26.0–34.0)
MCHC: 31.2 g/dL (ref 30.0–36.0)
MCV: 80.3 fL (ref 80.0–100.0)
Platelets: 275 K/uL (ref 150–400)
RBC: 4.31 MIL/uL (ref 3.87–5.11)
RDW: 16.9 % — ABNORMAL HIGH (ref 11.5–15.5)
WBC: 7.7 K/uL (ref 4.0–10.5)
nRBC: 0 % (ref 0.0–0.2)

## 2024-03-28 LAB — GLUCOSE, CAPILLARY: Glucose-Capillary: 99 mg/dL (ref 70–99)

## 2024-03-28 NOTE — Telephone Encounter (Signed)
 03/18/24 ov note containing risk assessment was faxed to [facility]

## 2024-03-28 NOTE — Progress Notes (Addendum)
 PCP - Elsie Lesches, MD Cardiologist - Sunit Manvel, DO  LOV 09-26-23 Tele clearance 03-25-24 epic  Rosaline Bane, NP   PPM/ICD -  Device Orders -  Rep Notified -   Chest x-ray -  EKG - 09-26-23 epic Stress Test -  ECHO - 07-27-22 epic Cardiac Cath -  CT coronary 205024 epic LAB- HgbA1c 02-28-24 epic  (6.1)  Sleep Study -  CPAP - Yes  Fasting Blood Sugar - not sure Checks Blood Sugar __occasional ___  Blood Thinner Instructions: Aspirin  Instructions:   ASA  81 mg hold 5-7 days  last dose 8-29  ERAS Protcol -Y PRE-SURGERY  no drink   COVID vaccine -yes  Activity--able to complete ADL's with no CP or SOB limited due to back pain  Anesthesia review: OSA CPAP, CKD, HTN. DM , BP 193/84 at preop pt. Reported she forgot to take her medicine last night and today. Harlene Ward PA-C aware. Pt. Advised to take BP med as prescribed  Patient denies shortness of breath, fever, cough and chest pain at PAT appointment   All instructions explained to the patient, with a verbal understanding of the material. Patient agrees to go over the instructions while at home for a better understanding. Patient also instructed to self quarantine after being tested for COVID-19. The opportunity to ask questions was provided.

## 2024-03-30 DIAGNOSIS — E1142 Type 2 diabetes mellitus with diabetic polyneuropathy: Secondary | ICD-10-CM | POA: Diagnosis not present

## 2024-03-30 DIAGNOSIS — E7800 Pure hypercholesterolemia, unspecified: Secondary | ICD-10-CM | POA: Diagnosis not present

## 2024-03-30 DIAGNOSIS — N183 Chronic kidney disease, stage 3 unspecified: Secondary | ICD-10-CM | POA: Diagnosis not present

## 2024-04-01 NOTE — Anesthesia Preprocedure Evaluation (Signed)
 Anesthesia Evaluation  Patient identified by MRN, date of birth, ID band Patient awake    Reviewed: Allergy & Precautions, NPO status , Patient's Chart, lab work & pertinent test results  History of Anesthesia Complications Negative for: history of anesthetic complications  Airway Mallampati: II  TM Distance: >3 FB Neck ROM: Full    Dental  (+) Missing,    Pulmonary sleep apnea and Continuous Positive Airway Pressure Ventilation    Pulmonary exam normal        Cardiovascular hypertension, Pt. on medications + CAD  Normal cardiovascular exam  TTE 07/27/2022: EF 55-60%, severe LVH, grade I DD, mild LAE, mild TR    Neuro/Psych   Anxiety Depression       GI/Hepatic Neg liver ROS,GERD  Controlled,,  Endo/Other  diabetes, Type 2, Oral Hypoglycemic Agents  Class 3 obesity  Renal/GU Renal InsufficiencyRenal disease     Musculoskeletal  (+) Arthritis ,    Abdominal   Peds  Hematology  (+) Blood dyscrasia (Hgb 10.8), anemia   Anesthesia Other Findings   Reproductive/Obstetrics                              Anesthesia Physical Anesthesia Plan  ASA: 3  Anesthesia Plan: General   Post-op Pain Management: Tylenol  PO (pre-op)   Induction: Intravenous  PONV Risk Score and Plan: 4 or greater and Treatment may vary due to age or medical condition, Ondansetron , Dexamethasone , Propofol  infusion and TIVA  Airway Management Planned: Oral ETT  Additional Equipment: ClearSight  Intra-op Plan:   Post-operative Plan: Extubation in OR  Informed Consent: I have reviewed the patients History and Physical, chart, labs and discussed the procedure including the risks, benefits and alternatives for the proposed anesthesia with the patient or authorized representative who has indicated his/her understanding and acceptance.     Dental advisory given  Plan Discussed with: CRNA  Anesthesia Plan Comments:  (See PAT note from 8/29 )         Anesthesia Quick Evaluation

## 2024-04-01 NOTE — Progress Notes (Addendum)
 Case: [id number] Date/Time: 04/03/24 1242   Procedures:      HYSTERECTOMY, TOTAL, ROBOT-ASSISTED, LAPAROSCOPIC, WITH BILATERAL SALPINGO-OOPHORECTOMY (Bilateral) - WITH POSSIBLE LAPAROTOMY     INJECTION, FOR SENTINEL LYMPH NODE IDENTIFICATION     LYMPH NODE BIOPSY     REPAIR, HERNIA, INCISIONAL   Anesthesia type: General   Diagnosis: Endometrial cancer (HCC) [C54.1]   Pre-op diagnosis: Endometrial cancer   Location: [facility] ROOM 05 / [facility]   Surgeons: [name], MD; Stechschulte, Deward PARAS, MD       DISCUSSION: [name] is an 78 yo female with PMH of HTN, nonobstructive CAD, OSA (uses CPAP), chronic DOE, GERD, T2DM (A1c 6.1) with neuropathy, CKD3, anemia, anxiety, obesity (BMI 44)  Patient underwent hysteroscopy and D&C on 02/28/24. Biopsy positive for endometrial cancer.  Patient follows with Cardiology for hx of nonobstructive CAD with chronic DOE. Prior ischemic w/u includes:   Nuclear stress test (08/14/2022) noted mild perfusion defect involving inferolateral segments.  She did undergo coronary CTA (08/30/2022) which noted mild CAC, aortic atherosclerosis, and mild nonobstructive disease. Echo in 2023 showed normal LVEF, grade I DD, mild TR. Last seen in clinic by Dr. Michele on 09/26/23. Stable at that visit. Advised continue medical therapy. She had cardiac clearance visit on 8/26 and was cleared:  Preoperative Cardiovascular Risk Assessment: According to the Revised Cardiac Risk Index (RCRI), her Perioperative Risk of Major Cardiac Event is (%): 0.4. Her Functional Capacity in METs is: 4.73 according to the Duke Activity Status Index (DASI). The patient is doing well from a cardiac perspective. Therefore, based on ACC/AHA guidelines, the patient would be at acceptable risk for the planned procedure without further cardiovascular testing.  Patient follows with Pulmonology for OSA on CPAP. Seen on 03/18/24 for risk assessment:  Patient is noted to be low risk of postoperative  pulmonary complications per the ARISCAT score listed below.  She may require support with BiPAP postoperatively pending a longer operative period.   ARISCAT score: Low risk 1.6% risk of in-hospital post-op pulmonary complications (composite including respiratory failure, respiratory infection, pleural effusion, atelectasis, pneumothorax, bronchospasm, aspiration pneumonitis -  Chart indicates history of ILD however most recent chest CT reviewed and does not demonstrate ILD findings. -She continues to demonstrate benefit from the use of her CPAP machine.  Will order new machine given that hers is more than 80 years old.  Check follow-up download at next appointment.  Of note patient's BP was significantly elevated in PAT clinic. She states she has been forgetting to take her BP meds. She was advised to take her BP as prescribed. On review of prior BP, they were elevated as well and patient was advised by other providers to take her BP meds.   VS: BP (!) 193/84   Pulse 61   Temp 36.8 C (Oral)   Resp 18   Ht 5' 6 (1.676 m)   Wt 124.3 kg   SpO2 99%   BMI 44.22 kg/m    BP Readings from Last 3 Encounters:  03/28/24 (!) 193/84  03/18/24 (!) 174/74  03/07/24 (!) 145/78    PROVIDERS: [name], MD   LABS: Labs reviewed: Acceptable for surgery. Anemia and CKD stable. (all labs ordered are listed, but only abnormal results are displayed)  Labs Reviewed  CBC - Abnormal; Notable for the following components:      Result Value   Hemoglobin 10.8 (*)    HCT 34.6 (*)    MCH 25.1 (*)    RDW  16.9 (*)    All other components within normal limits  COMPREHENSIVE METABOLIC PANEL WITH GFR - Abnormal; Notable for the following components:   Creatinine, Ser 1.28 (*)    Calcium  10.5 (*)    GFR, Estimated 42 (*)    All other components within normal limits  GLUCOSE, CAPILLARY  TYPE AND SCREEN     IMAGES:   EKG 09/26/23:  Normal sinus rhythm Cannot rule out Anterior infarct ,  age undetermined When compared with ECG of 23-Dec-2014 15:08, No significant change was found  CV:  Coronary CTA 08/30/22:  IMPRESSION: 1. Total coronary calcium  score of 36.9. This was 50th percentile for age and sex matched control.   2. Normal coronary origin with right dominance.   3. Aortic atherosclerosis.   4. CAD-RADS = 1.   Left Main: Patent.   LAD: Minimal stenosis (0-24%) due to calcified plaque in the mid LAD remainder of the vessel is patent.   LCX: Patent.   RCA: Patent.   RECOMMENDATIONS:   Consider non-atherosclerotic causes of chest pain. Consider preventive therapy and risk factor modification.  Lexiscan  Tetrofosmin stress test 08/14/2022:  Lexiscan  nuclear stress test performed using 1-day protocol. SPECT images show small sized, mild intensity, reversible perfusion defect in basal inferolateral myocardium. Stress LVEF 60%. Low risk study.    Echocardiogram 07/27/2022:    Normal LV systolic function with visual EF 55-60%. Left ventricle cavity is normal in size. Severe concentric hypertrophy of the left ventricle. Normal global wall motion. Doppler evidence of grade I (impaired) diastolic dysfunction, normal LAP. Calculated EF 57%. Left atrial cavity is mildly dilated. Structurally normal tricuspid valve.  Mild tricuspid regurgitation. No evidence of pulmonary hypertension. No prior available for comparison. Incidental finding of gallstones present, correlate clinically.  Past Medical History:  Diagnosis Date   Anemia    Anxiety    Cancer (HCC)    breast treated with radiation  endometrial   Chronic fatigue syndrome    Chronic low back pain    followed by dr jinny. mavis   CKD (chronic kidney disease), stage III (HCC)    followed by pcp   Constipation 02/20/2024   02-21-2024  pt stated viist w/ PCP due to constipation given stool softner and abdominal xray done   Coronary artery calcification 05/2022   cardiologist--- dr s. michele;   CT  11/ 2023 ;  CCTA 09-04-2022  calcium  score=36.9 involving LAD w/ mild nonob dz;  NUC 08-14-2022  LR w/ mild perfusion defect reversible, nuclear ef 60%   DDD (degenerative disc disease), lumbar    Depression    Diverticulosis of colon    DOE (dyspnea on exertion)    02-21-2024  sob w/ stairs/ long distance walking/  okay with household chores ,  per cardiologist note 09/2023  chronic DOE stable   Edema of both lower extremities    02-21-2024 pt stated left > right   Flat foot, acquired    GERD (gastroesophageal reflux disease)    History of adenomatous polyp of colon    GI-- dr shila   History of gout    History of kidney stones    History of left breast cancer 2013   oncologist--- dr layla (lov 09-06-2017 released w/ no recurrence) ;  08-30-2011  s/p left lumpectomy w/ node dissection;  Stage IAG1, IDC, ER/PR+, nodes negative;  completed IMRT 11-24-2011 ,  completed tamoxifen  02/ 2019   Hyperlipidemia    Hypertension    Lumbar stenosis with neurogenic claudication  per pt left leg   OA (osteoarthritis)    OSA on CPAP 07/2004   followed by [facility]--- dr jude;   sleep study in epic 08-05-2004 severe osa, (02-21-2024 pt stated uses cpap nightly)   Peripheral neuropathy    Personal history of radiation therapy 09/2011   left breast/ chest wall   10-29-2011  to  11-24-2011  6100 cGy   Pneumonia    Rash    02-21-2024  pt stated has rash under abd fold and between thighs   Sickle cell trait (HCC)    Thickened endometrium    Tinnitus of both ears    Type 2 diabetes mellitus (HCC)    followed by pcp:   (02-21-2024  pt stated checks  blood sugar 2 times montly , not fasting)   Wears partial dentures    upper    Past Surgical History:  Procedure Laterality Date   APPENDECTOMY  1978   BIOPSY  10/06/2021   Procedure: BIOPSY;  Surgeon: Shila Gustav GAILS, MD;  Location: [facility];  Service: Endoscopy;;   BLEPHAROPLASTY Right    age 33s---  right upper eyelid   BREAST  LUMPECTOMY WITH NEEDLE LOCALIZATION Right 02/06/2013   Procedure: RIGHT BREAST LUMPECTOMY WITH NEEDLE LOCALIZATION;  Surgeon: Vicenta DELENA Poli, MD;  Location: [facility];  Service: General;  Laterality: Right;   BREAST LUMPECTOMY WITH NEEDLE LOCALIZATION AND AXILLARY SENTINEL LYMPH NODE BX Left 08/30/2011   @[facility]  by dr d. poli;   CATARACT EXTRACTION W/ INTRAOCULAR LENS IMPLANT Bilateral 2018   COLONOSCOPY WITH PROPOFOL  N/A 10/27/2015   Procedure: COLONOSCOPY WITH PROPOFOL ;  Surgeon: Gustav Shila GAILS, MD;  Location: [facility];  Service: Endoscopy;  Laterality: N/A;   COLONOSCOPY WITH PROPOFOL  N/A 10/06/2021   Procedure: COLONOSCOPY WITH PROPOFOL ;  Surgeon: Shila Gustav GAILS, MD;  [facility];  Service: Endoscopy;  Laterality: N/A;   DILATATION & CURRETTAGE/HYSTEROSCOPY WITH RESECTOCOPE N/A 02/28/2024   Procedure: DILATATION & CURETTAGE/HYSTEROSCOPY;  Surgeon: Okey Leader, MD;  Location: [facility];  Service: Gynecology;  Laterality: N/A;   HARDWARE REMOVAL Left 04/20/2015   Procedure: HARDWARE REMOVAL LEFT LUMBAR FIVE SCREW;  Surgeon: Darina Boehringer, MD;  Location: [facility] OR;  Service: Neurosurgery;  Laterality: Left;   HYSTEROSCOPY WITH D & C  10/04/2005   @[facility]  by dr a. okey;  polypectomy   POLYPECTOMY  10/06/2021   Procedure: POLYPECTOMY;  Surgeon: Shila Gustav GAILS, MD;  [facility];  Service: Endoscopy;;   POSTERIOR FUSION LUMBAR SPINE  04/19/2020   @[facility]  by dr jonelle. kritzer:  Laminectomy / Decompression  L2--3 /  interbody fusion w/ segmental instrumentation L2--L5   POSTERIOR LUMBAR FUSION 2 LEVEL  12/29/2014   @[facility]  by dr jonelle. kritzer;   L3--4  and L4--5   THYROID  CYST EXCISION  1994   benign   TOE SURGERY Left    TOTAL KNEE ARTHROPLASTY Left 08/19/2004   @[facility]  by dr r. gioffre   TOTAL KNEE ARTHROPLASTY Right 05/24/2005   @[facility]  by dr r. gioffre   TRANSPHENOIDAL PITUITARY RESECTION  01/25/2010   @[facility]  by dr r. kritzer/ dr c. eldonna;    TRANSSEPTAL TRANSSPHENOIDAL RESECTION OF  PITUITARY MACROADENOMA    MEDICATIONS:  acetaminophen  (TYLENOL ) 500 MG tablet   aspirin  EC 81 MG tablet   atorvastatin  (LIPITOR) 20 MG tablet   Cholecalciferol  (VITAMIN D -3) 25 MCG (1000 UT) CAPS   clotrimazole-betamethasone  (LOTRISONE) cream   escitalopram  (LEXAPRO ) 20 MG tablet   ezetimibe  (ZETIA ) 10 MG tablet   Glycerin-Polysorbate 80 (REFRESH  DRY EYE THERAPY OP)   hydrALAZINE (APRESOLINE) 25 MG tablet   hydrochlorothiazide  (HYDRODIURIL ) 25 MG tablet   HYDROcodone -acetaminophen  (NORCO/VICODIN) 5-325 MG tablet   hydrOXYzine (ATARAX) 25 MG tablet   irbesartan  (AVAPRO ) 300 MG tablet   metFORMIN  (GLUCOPHAGE ) 500 MG tablet   nystatin powder   verapamil  (VERELAN  PM) 240 MG 24 hr capsule   No current facility-administered medications for this encounter.   Burnard CHRISTELLA Odis DEVONNA [facility] Short Stay/Anesthesiology [facility] Phone [phone] 04/01/2024 9:22 AM

## 2024-04-02 ENCOUNTER — Telehealth: Payer: Self-pay

## 2024-04-02 NOTE — Telephone Encounter (Signed)
Telephone call to check on pre-operative status.  Patient compliant with pre-operative instructions.  Reinforced nothing to eat after midnight. Clear liquids until 0945. Patient to arrive at 1045.  No questions or concerns voiced.  Instructed to call for any needs.

## 2024-04-02 NOTE — Discharge Instructions (Signed)
 AFTER SURGERY INSTRUCTIONS   Return to work: 4-6 weeks if applicable  We have sent in additional hydrocodone /APAP to your pharmacy to use for after surgery. This has tylenol  in it so you will need to monitor your tylenol  intake. Do not exceed 4,000 mg in a 24 hour period.   Activity: 1. Be up and out of the bed during the day.  Take a nap if needed.  You may walk up steps but be careful and use the hand rail.  Stair climbing will tire you more than you think, you may need to stop part way and rest.    2. No lifting or straining for 6 weeks over 10 pounds. No pushing, pulling, straining for 6 weeks.   3. No driving for 4-89 days when the following criteria have been met: Do not drive if you are taking narcotic pain medicine and make sure that your reaction time has returned.    4. You can shower as soon as the next day after surgery. Shower daily.  Use your regular soap and water  (not directly on the incision) and pat your incision(s) dry afterwards; don't rub.  No tub baths or submerging your body in water  until cleared by your surgeon. If you have the soap that was given to you by pre-surgical testing that was used before surgery, you do not need to use it afterwards because this can irritate your incisions.    5. No sexual activity and nothing in the vagina for 12 weeks.   6. You may experience a small amount of clear drainage from your incisions, which is normal.  If the drainage persists, increases, or changes color please call the office.   7. Do not use creams, lotions, or ointments such as neosporin on your incisions after surgery until advised by your surgeon because they can cause removal of the dermabond glue on your incisions.     8. You may experience vaginal spotting after surgery or when the stitches at the top of the vagina begin to dissolve.  The spotting is normal but if you experience heavy bleeding, call our office.   9. Take Tylenol  first for pain if you are able to take  these medication and only use narcotic pain medication for severe pain not relieved by the Tylenol .  Monitor your Tylenol  intake to a max of 4,000 mg in a 24 hour period.   Diet: 1. Low sodium Heart Healthy Diet is recommended but you are cleared to resume your normal (before surgery) diet after your procedure.   2. It is safe to use a laxative, such as Miralax  or Colace, if you have difficulty moving your bowels before surgery. You have been prescribed Sennakot-S to take at bedtime every evening after surgery to keep bowel movements regular and to prevent constipation.     Wound Care: 1. Keep clean and dry.  Shower daily.   Reasons to call the Doctor: Fever - Oral temperature greater than 100.4 degrees Fahrenheit Foul-smelling vaginal discharge Difficulty urinating Nausea and vomiting Increased pain at the site of the incision that is unrelieved with pain medicine. Difficulty breathing with or without chest pain New calf pain especially if only on one side Sudden, continuing increased vaginal bleeding with or without clots.   Contacts: For questions or concerns you should contact:   Dr. Comer Dollar at [phone]   Eleanor Epps, NP at [phone]   After Hours: call [phone] and have the GYN Oncologist paged/contacted (after 5 pm or on the  weekends). You will speak with an after hours RN and let he or she know you have had surgery.   Messages sent via mychart are for non-urgent matters and are not responded to after hours so for urgent needs, please call the after hours number.

## 2024-04-03 ENCOUNTER — Ambulatory Visit (HOSPITAL_COMMUNITY)
Admission: RE | Admit: 2024-04-03 | Discharge: 2024-04-04 | Disposition: A | Discharge status: Home / Self Care | Attending: Gynecologic Oncology | Admitting: Gynecologic Oncology

## 2024-04-03 ENCOUNTER — Encounter (HOSPITAL_COMMUNITY): Payer: Self-pay | Admitting: Gynecologic Oncology

## 2024-04-03 ENCOUNTER — Ambulatory Visit (HOSPITAL_COMMUNITY): Admitting: Medical

## 2024-04-03 ENCOUNTER — Other Ambulatory Visit: Payer: Self-pay

## 2024-04-03 ENCOUNTER — Ambulatory Visit (HOSPITAL_BASED_OUTPATIENT_CLINIC_OR_DEPARTMENT_OTHER): Admitting: Anesthesiology

## 2024-04-03 ENCOUNTER — Encounter (HOSPITAL_COMMUNITY)
Admission: RE | Disposition: A | Discharge status: Home / Self Care | Payer: Self-pay | Source: Home / Self Care | Attending: Gynecologic Oncology

## 2024-04-03 DIAGNOSIS — I2510 Atherosclerotic heart disease of native coronary artery without angina pectoris: Secondary | ICD-10-CM | POA: Insufficient documentation

## 2024-04-03 DIAGNOSIS — Z7984 Long term (current) use of oral hypoglycemic drugs: Secondary | ICD-10-CM | POA: Diagnosis not present

## 2024-04-03 DIAGNOSIS — E66813 Obesity, class 3: Secondary | ICD-10-CM | POA: Diagnosis not present

## 2024-04-03 DIAGNOSIS — I129 Hypertensive chronic kidney disease with stage 1 through stage 4 chronic kidney disease, or unspecified chronic kidney disease: Secondary | ICD-10-CM | POA: Insufficient documentation

## 2024-04-03 DIAGNOSIS — M1990 Unspecified osteoarthritis, unspecified site: Secondary | ICD-10-CM | POA: Insufficient documentation

## 2024-04-03 DIAGNOSIS — N183 Chronic kidney disease, stage 3 unspecified: Secondary | ICD-10-CM | POA: Insufficient documentation

## 2024-04-03 DIAGNOSIS — D649 Anemia, unspecified: Secondary | ICD-10-CM | POA: Diagnosis not present

## 2024-04-03 DIAGNOSIS — Z853 Personal history of malignant neoplasm of breast: Secondary | ICD-10-CM | POA: Diagnosis not present

## 2024-04-03 DIAGNOSIS — Z79899 Other long term (current) drug therapy: Secondary | ICD-10-CM | POA: Insufficient documentation

## 2024-04-03 DIAGNOSIS — G4733 Obstructive sleep apnea (adult) (pediatric): Secondary | ICD-10-CM | POA: Insufficient documentation

## 2024-04-03 DIAGNOSIS — K432 Incisional hernia without obstruction or gangrene: Secondary | ICD-10-CM | POA: Diagnosis not present

## 2024-04-03 DIAGNOSIS — F32A Depression, unspecified: Secondary | ICD-10-CM | POA: Insufficient documentation

## 2024-04-03 DIAGNOSIS — Z6841 Body Mass Index (BMI) 40.0 and over, adult: Secondary | ICD-10-CM | POA: Diagnosis not present

## 2024-04-03 DIAGNOSIS — K660 Peritoneal adhesions (postprocedural) (postinfection): Secondary | ICD-10-CM | POA: Diagnosis not present

## 2024-04-03 DIAGNOSIS — F419 Anxiety disorder, unspecified: Secondary | ICD-10-CM | POA: Diagnosis not present

## 2024-04-03 DIAGNOSIS — C7962 Secondary malignant neoplasm of left ovary: Secondary | ICD-10-CM | POA: Diagnosis not present

## 2024-04-03 DIAGNOSIS — K5900 Constipation, unspecified: Secondary | ICD-10-CM | POA: Diagnosis not present

## 2024-04-03 DIAGNOSIS — E1142 Type 2 diabetes mellitus with diabetic polyneuropathy: Secondary | ICD-10-CM | POA: Diagnosis not present

## 2024-04-03 DIAGNOSIS — C541 Malignant neoplasm of endometrium: Secondary | ICD-10-CM

## 2024-04-03 DIAGNOSIS — C7989 Secondary malignant neoplasm of other specified sites: Secondary | ICD-10-CM | POA: Diagnosis not present

## 2024-04-03 DIAGNOSIS — E119 Type 2 diabetes mellitus without complications: Secondary | ICD-10-CM

## 2024-04-03 DIAGNOSIS — C7982 Secondary malignant neoplasm of genital organs: Secondary | ICD-10-CM | POA: Insufficient documentation

## 2024-04-03 HISTORY — PX: INCISIONAL HERNIA REPAIR: SHX193

## 2024-04-03 HISTORY — PX: LYMPH NODE BIOPSY: SHX201

## 2024-04-03 HISTORY — PX: INJECTION, FOR SENTINEL LYMPH NODE IDENTIFICATION: SHX7598

## 2024-04-03 HISTORY — PX: ROBOTIC ASSISTED TOTAL HYSTERECTOMY WITH BILATERAL SALPINGO OOPHERECTOMY: SHX6086

## 2024-04-03 HISTORY — PX: ROBOTIC ASSISTED LAPAROSCOPIC LYSIS OF ADHESION: SHX6080

## 2024-04-03 LAB — GLUCOSE, CAPILLARY
Glucose-Capillary: 109 mg/dL — ABNORMAL HIGH (ref 70–99)
Glucose-Capillary: 124 mg/dL — ABNORMAL HIGH (ref 70–99)

## 2024-04-03 LAB — TYPE AND SCREEN
ABO/RH(D): A POS
Antibody Screen: NEGATIVE

## 2024-04-03 SURGERY — HYSTERECTOMY, TOTAL, ROBOT-ASSISTED, LAPAROSCOPIC, WITH BILATERAL SALPINGO-OOPHORECTOMY
Anesthesia: General

## 2024-04-03 MED ORDER — ENOXAPARIN SODIUM 40 MG/0.4ML IJ SOSY
40.0000 mg | PREFILLED_SYRINGE | INTRAMUSCULAR | Status: DC
  Administered 2024-04-04: 40 mg via SUBCUTANEOUS
  Filled 2024-04-03: qty 0.4

## 2024-04-03 MED ORDER — FENTANYL CITRATE (PF) 250 MCG/5ML IJ SOLN
INTRAMUSCULAR | Status: AC
  Filled 2024-04-03: qty 5

## 2024-04-03 MED ORDER — ACETAMINOPHEN 500 MG PO TABS
1000.0000 mg | ORAL_TABLET | ORAL | Status: AC
  Administered 2024-04-03: 1000 mg via ORAL
  Filled 2024-04-03: qty 2

## 2024-04-03 MED ORDER — DEXAMETHASONE SODIUM PHOSPHATE 10 MG/ML IJ SOLN
INTRAMUSCULAR | Status: AC
  Filled 2024-04-03: qty 3

## 2024-04-03 MED ORDER — PROPOFOL 10 MG/ML IV BOLUS
INTRAVENOUS | Status: AC
Start: 2024-04-03 — End: 2024-04-03
  Filled 2024-04-03: qty 20

## 2024-04-03 MED ORDER — LIDOCAINE 2% (20 MG/ML) 5 ML SYRINGE
INTRAMUSCULAR | Status: DC | PRN
  Administered 2024-04-03: 80 mg via INTRAVENOUS

## 2024-04-03 MED ORDER — OXYCODONE HCL 5 MG PO TABS: 5.0000 mg | ORAL_TABLET | Freq: Once | ORAL | Status: DC | PRN

## 2024-04-03 MED ORDER — CHLORHEXIDINE GLUCONATE 0.12 % MT SOLN
15.0000 mL | Freq: Once | OROMUCOSAL | Status: AC
  Administered 2024-04-03: 15 mL via OROMUCOSAL

## 2024-04-03 MED ORDER — STERILE WATER FOR IRRIGATION IR SOLN
Status: DC | PRN
  Administered 2024-04-03: 1000 mL

## 2024-04-03 MED ORDER — STERILE WATER FOR INJECTION IJ SOLN
INTRAMUSCULAR | Status: DC | PRN
  Administered 2024-04-03: 10 mL

## 2024-04-03 MED ORDER — FENTANYL CITRATE (PF) 250 MCG/5ML IJ SOLN
INTRAMUSCULAR | Status: DC | PRN
  Administered 2024-04-03 (×4): 50 ug via INTRAVENOUS

## 2024-04-03 MED ORDER — SENNOSIDES-DOCUSATE SODIUM 8.6-50 MG PO TABS
2.0000 | ORAL_TABLET | Freq: Every day | ORAL | Status: DC
  Administered 2024-04-03: 2 via ORAL
  Filled 2024-04-03: qty 2

## 2024-04-03 MED ORDER — PHENYLEPHRINE 80 MCG/ML (10ML) SYRINGE FOR IV PUSH (FOR BLOOD PRESSURE SUPPORT)
PREFILLED_SYRINGE | INTRAVENOUS | Status: AC
Start: 2024-04-03 — End: 2024-04-03
  Filled 2024-04-03: qty 10

## 2024-04-03 MED ORDER — ESCITALOPRAM OXALATE 20 MG PO TABS
20.0000 mg | ORAL_TABLET | Freq: Every day | ORAL | Status: DC
  Administered 2024-04-04: 20 mg via ORAL
  Filled 2024-04-03: qty 1

## 2024-04-03 MED ORDER — ORAL CARE MOUTH RINSE: 15.0000 mL | Freq: Once | OROMUCOSAL | Status: AC

## 2024-04-03 MED ORDER — PROPOFOL 10 MG/ML IV BOLUS
INTRAVENOUS | Status: DC | PRN
  Administered 2024-04-03: 30 mg via INTRAVENOUS
  Administered 2024-04-03: 170 mg via INTRAVENOUS

## 2024-04-03 MED ORDER — ACETAMINOPHEN 500 MG PO TABS: 1000.0000 mg | ORAL_TABLET | Freq: Once | ORAL | Status: DC

## 2024-04-03 MED ORDER — ONDANSETRON HCL 4 MG/2ML IJ SOLN
INTRAMUSCULAR | Status: DC | PRN
  Administered 2024-04-03: 4 mg via INTRAVENOUS

## 2024-04-03 MED ORDER — MIDAZOLAM HCL 2 MG/2ML IJ SOLN
INTRAMUSCULAR | Status: AC
  Filled 2024-04-03: qty 2

## 2024-04-03 MED ORDER — INSULIN ASPART 100 UNIT/ML IJ SOLN: 0.0000 [IU] | INTRAMUSCULAR | Status: DC | PRN

## 2024-04-03 MED ORDER — LABETALOL HCL 5 MG/ML IV SOLN
INTRAVENOUS | Status: AC
  Filled 2024-04-03: qty 4

## 2024-04-03 MED ORDER — STERILE WATER FOR INJECTION IJ SOLN
INTRAMUSCULAR | Status: AC
  Filled 2024-04-03: qty 10

## 2024-04-03 MED ORDER — HYDROMORPHONE HCL 1 MG/ML IJ SOLN: 0.2500 mg | INTRAMUSCULAR | Status: DC | PRN

## 2024-04-03 MED ORDER — PROPOFOL 500 MG/50ML IV EMUL
INTRAVENOUS | Status: DC | PRN
  Administered 2024-04-03: 125 ug/kg/min via INTRAVENOUS

## 2024-04-03 MED ORDER — BUPIVACAINE HCL (PF) 0.25 % IJ SOLN
INTRAMUSCULAR | Status: DC | PRN
  Administered 2024-04-03: 30 mL

## 2024-04-03 MED ORDER — LABETALOL HCL 5 MG/ML IV SOLN
INTRAVENOUS | Status: DC | PRN
  Administered 2024-04-03 (×2): 2.5 mg via INTRAVENOUS
  Administered 2024-04-03: 10 mg via INTRAVENOUS
  Administered 2024-04-03: 5 mg via INTRAVENOUS

## 2024-04-03 MED ORDER — PROPOFOL 10 MG/ML IV BOLUS
INTRAVENOUS | Status: AC
  Filled 2024-04-03: qty 20

## 2024-04-03 MED ORDER — ROCURONIUM BROMIDE 10 MG/ML (PF) SYRINGE
PREFILLED_SYRINGE | INTRAVENOUS | Status: AC
  Filled 2024-04-03: qty 30

## 2024-04-03 MED ORDER — LIDOCAINE HCL (PF) 2 % IJ SOLN
INTRAMUSCULAR | Status: AC
  Filled 2024-04-03: qty 15

## 2024-04-03 MED ORDER — OXYCODONE HCL 5 MG PO TABS
5.0000 mg | ORAL_TABLET | ORAL | Status: DC | PRN
  Administered 2024-04-03: 5 mg via ORAL
  Administered 2024-04-04: 10 mg via ORAL
  Filled 2024-04-03 (×2): qty 2

## 2024-04-03 MED ORDER — ATORVASTATIN CALCIUM 20 MG PO TABS
20.0000 mg | ORAL_TABLET | Freq: Every day | ORAL | Status: DC
  Administered 2024-04-03: 20 mg via ORAL
  Filled 2024-04-03: qty 1

## 2024-04-03 MED ORDER — IRBESARTAN 150 MG PO TABS
300.0000 mg | ORAL_TABLET | Freq: Every day | ORAL | Status: DC
Start: 2024-04-03 — End: 2024-04-04
  Administered 2024-04-03: 300 mg via ORAL
  Filled 2024-04-03: qty 2

## 2024-04-03 MED ORDER — PROPOFOL 500 MG/50ML IV EMUL
INTRAVENOUS | Status: AC
  Filled 2024-04-03: qty 100

## 2024-04-03 MED ORDER — OXYCODONE HCL 5 MG/5ML PO SOLN: 5.0000 mg | Freq: Once | ORAL | Status: DC | PRN

## 2024-04-03 MED ORDER — PHENYLEPHRINE HCL (PRESSORS) 10 MG/ML IV SOLN
INTRAVENOUS | Status: AC
  Filled 2024-04-03: qty 1

## 2024-04-03 MED ORDER — SPY AGENT GREEN - (INDOCYANINE FOR INJECTION)
INTRAMUSCULAR | Status: DC | PRN
  Administered 2024-04-03: 4 mL via INTRAMUSCULAR

## 2024-04-03 MED ORDER — MIDAZOLAM HCL 2 MG/2ML IJ SOLN
INTRAMUSCULAR | Status: DC | PRN
  Administered 2024-04-03: 1 mg via INTRAVENOUS

## 2024-04-03 MED ORDER — HYDROMORPHONE HCL 1 MG/ML IJ SOLN
INTRAMUSCULAR | Status: DC | PRN
  Administered 2024-04-03 (×2): .5 mg via INTRAVENOUS

## 2024-04-03 MED ORDER — EPHEDRINE 5 MG/ML INJ
INTRAVENOUS | Status: AC
  Filled 2024-04-03: qty 5

## 2024-04-03 MED ORDER — ROCURONIUM BROMIDE 10 MG/ML (PF) SYRINGE
PREFILLED_SYRINGE | INTRAVENOUS | Status: DC | PRN
  Administered 2024-04-03: 70 mg via INTRAVENOUS
  Administered 2024-04-03: 30 mg via INTRAVENOUS

## 2024-04-03 MED ORDER — DEXTROSE-SODIUM CHLORIDE 5-0.45 % IV SOLN: INTRAVENOUS | Status: DC

## 2024-04-03 MED ORDER — ACETAMINOPHEN 500 MG PO TABS
1000.0000 mg | ORAL_TABLET | Freq: Four times a day (QID) | ORAL | Status: DC
  Administered 2024-04-03 – 2024-04-04 (×2): 1000 mg via ORAL
  Filled 2024-04-03 (×3): qty 2

## 2024-04-03 MED ORDER — ONDANSETRON HCL 4 MG PO TABS: 4.0000 mg | ORAL_TABLET | Freq: Four times a day (QID) | ORAL | Status: DC | PRN

## 2024-04-03 MED ORDER — FENTANYL CITRATE PF 50 MCG/ML IJ SOSY: 25.0000 ug | PREFILLED_SYRINGE | INTRAMUSCULAR | Status: DC | PRN

## 2024-04-03 MED ORDER — LACTATED RINGERS IR SOLN
Status: DC | PRN
  Administered 2024-04-03: 1

## 2024-04-03 MED ORDER — BUPIVACAINE HCL (PF) 0.25 % IJ SOLN
INTRAMUSCULAR | Status: AC
  Filled 2024-04-03: qty 30

## 2024-04-03 MED ORDER — LACTATED RINGERS IV SOLN: INTRAVENOUS | Status: DC

## 2024-04-03 MED ORDER — PROPOFOL 1000 MG/100ML IV EMUL
INTRAVENOUS | Status: AC
  Filled 2024-04-03: qty 400

## 2024-04-03 MED ORDER — CEFAZOLIN SODIUM-DEXTROSE 3-4 GM/150ML-% IV SOLN
3.0000 g | INTRAVENOUS | Status: AC
  Administered 2024-04-03: 3 g via INTRAVENOUS
  Filled 2024-04-03: qty 150

## 2024-04-03 MED ORDER — HEPARIN SODIUM (PORCINE) 5000 UNIT/ML IJ SOLN
5000.0000 [IU] | INTRAMUSCULAR | Status: AC
  Administered 2024-04-03: 5000 [IU] via SUBCUTANEOUS
  Filled 2024-04-03: qty 1

## 2024-04-03 MED ORDER — SUGAMMADEX SODIUM 200 MG/2ML IV SOLN
INTRAVENOUS | Status: AC
  Filled 2024-04-03: qty 6

## 2024-04-03 MED ORDER — SUGAMMADEX SODIUM 200 MG/2ML IV SOLN
INTRAVENOUS | Status: DC | PRN
  Administered 2024-04-03: 300 mg via INTRAVENOUS
  Administered 2024-04-03: 100 mg via INTRAVENOUS

## 2024-04-03 MED ORDER — INSULIN ASPART 100 UNIT/ML IJ SOLN: 0.0000 [IU] | Freq: Three times a day (TID) | INTRAMUSCULAR | Status: DC

## 2024-04-03 MED ORDER — DEXAMETHASONE SODIUM PHOSPHATE 10 MG/ML IJ SOLN: 4.0000 mg | INTRAMUSCULAR | Status: DC

## 2024-04-03 MED ORDER — TRAMADOL HCL 50 MG PO TABS: 50.0000 mg | ORAL_TABLET | Freq: Four times a day (QID) | ORAL | Status: DC | PRN

## 2024-04-03 MED ORDER — ONDANSETRON HCL 4 MG/2ML IJ SOLN: 4.0000 mg | Freq: Four times a day (QID) | INTRAMUSCULAR | Status: DC | PRN

## 2024-04-03 MED ORDER — DEXAMETHASONE SODIUM PHOSPHATE 10 MG/ML IJ SOLN
INTRAMUSCULAR | Status: DC | PRN
  Administered 2024-04-03: 5 mg via INTRAVENOUS

## 2024-04-03 MED ORDER — ONDANSETRON HCL 4 MG/2ML IJ SOLN
INTRAMUSCULAR | Status: AC
  Filled 2024-04-03: qty 6

## 2024-04-03 MED ORDER — DROPERIDOL 2.5 MG/ML IJ SOLN: 0.6250 mg | Freq: Once | INTRAMUSCULAR | Status: DC | PRN

## 2024-04-03 MED ORDER — EZETIMIBE 10 MG PO TABS
10.0000 mg | ORAL_TABLET | Freq: Every day | ORAL | Status: DC
  Administered 2024-04-03: 10 mg via ORAL
  Filled 2024-04-03: qty 1

## 2024-04-03 MED ORDER — HYDROMORPHONE HCL 2 MG/ML IJ SOLN
INTRAMUSCULAR | Status: AC
  Filled 2024-04-03: qty 1

## 2024-04-03 SURGICAL SUPPLY — 94 items
APPLICATOR ARISTA FLEXITIP XL (MISCELLANEOUS) IMPLANT
APPLICATOR SURGIFLO ENDO (HEMOSTASIS) IMPLANT
BAG COUNTER SPONGE SURGICOUNT (BAG) ×3 IMPLANT
BAG LAPAROSCOPIC 12 15 PORT 16 (BASKET) IMPLANT
BINDER ABDOMINAL 12 ML 46-62 (SOFTGOODS) IMPLANT
BLADE SURG SZ10 CARB STEEL (BLADE) IMPLANT
CHLORAPREP W/TINT 26 (MISCELLANEOUS) ×3 IMPLANT
COVER BACK TABLE 60X90IN (DRAPES) ×3 IMPLANT
COVER SURGICAL LIGHT HANDLE (MISCELLANEOUS) ×3 IMPLANT
COVER TIP SHEARS 8 DVNC (MISCELLANEOUS) ×3 IMPLANT
DERMABOND ADVANCED .7 DNX12 (GAUZE/BANDAGES/DRESSINGS) ×6 IMPLANT
DRAIN CHANNEL 19F RND (DRAIN) IMPLANT
DRAPE ARM DVNC X/XI (DISPOSABLE) ×12 IMPLANT
DRAPE COLUMN DVNC XI (DISPOSABLE) ×3 IMPLANT
DRAPE LAPAROSCOPIC ABDOMINAL (DRAPES) ×3 IMPLANT
DRAPE SHEET LG 3/4 BI-LAMINATE (DRAPES) ×3 IMPLANT
DRAPE SURG IRRIG POUCH 19X23 (DRAPES) ×3 IMPLANT
DRAPE WARM FLUID 44X44 (DRAPES) ×3 IMPLANT
DRIVER NDL MEGA SUTCUT DVNCXI (INSTRUMENTS) ×3 IMPLANT
DRIVER NDLE MEGA SUTCUT DVNCXI (INSTRUMENTS) ×3 IMPLANT
DRSG OPSITE POSTOP 4X6 (GAUZE/BANDAGES/DRESSINGS) IMPLANT
DRSG OPSITE POSTOP 4X8 (GAUZE/BANDAGES/DRESSINGS) IMPLANT
ELECT BLADE TIP CTD 4 INCH (ELECTRODE) ×3 IMPLANT
ELECT PENCIL ROCKER SW 15FT (MISCELLANEOUS) ×3 IMPLANT
ELECT REM PT RETURN 15FT ADLT (MISCELLANEOUS) ×6 IMPLANT
EVACUATOR SILICONE 100CC (DRAIN) IMPLANT
FORCEPS BPLR FENES DVNC XI (FORCEP) ×3 IMPLANT
FORCEPS PROGRASP DVNC XI (FORCEP) ×3 IMPLANT
GAUZE 4X4 16PLY ~~LOC~~+RFID DBL (SPONGE) ×9 IMPLANT
GAUZE SPONGE 4X4 12PLY STRL (GAUZE/BANDAGES/DRESSINGS) ×3 IMPLANT
GLOVE BIO SURGEON STRL SZ 6 (GLOVE) ×12 IMPLANT
GLOVE BIO SURGEON STRL SZ 6.5 (GLOVE) ×3 IMPLANT
GLOVE BIO SURGEON STRL SZ7.5 (GLOVE) ×3 IMPLANT
GLOVE INDICATOR 8.0 STRL GRN (GLOVE) ×3 IMPLANT
GOWN STRL REUS W/ TWL LRG LVL3 (GOWN DISPOSABLE) ×12 IMPLANT
GOWN STRL REUS W/ TWL XL LVL3 (GOWN DISPOSABLE) ×3 IMPLANT
GRASPER SUT TROCAR 14GX15 (MISCELLANEOUS) IMPLANT
HEMOSTAT ARISTA ABSORB 3G PWDR (HEMOSTASIS) IMPLANT
HOLDER FOLEY CATH W/STRAP (MISCELLANEOUS) IMPLANT
IRRIGATION SUCT STRKRFLW 2 WTP (MISCELLANEOUS) ×3 IMPLANT
KIT BASIN OR (CUSTOM PROCEDURE TRAY) ×3 IMPLANT
KIT PROCEDURE DVNC SI (MISCELLANEOUS) IMPLANT
KIT TURNOVER KIT A (KITS) ×6 IMPLANT
LIGASURE IMPACT 36 18CM CVD LR (INSTRUMENTS) IMPLANT
MANIPULATOR ADVINCU DEL 3.0 PL (MISCELLANEOUS) IMPLANT
MANIPULATOR ADVINCU DEL 3.5 PL (MISCELLANEOUS) IMPLANT
MANIPULATOR UTERINE 4.5 ZUMI (MISCELLANEOUS) IMPLANT
MARKER SKIN DUAL TIP RULER LAB (MISCELLANEOUS) ×3 IMPLANT
NDL HYPO 21X1.5 SAFETY (NEEDLE) ×3 IMPLANT
NDL SPNL 18GX3.5 QUINCKE PK (NEEDLE) IMPLANT
NEEDLE HYPO 21X1.5 SAFETY (NEEDLE) ×3 IMPLANT
NEEDLE SPNL 18GX3.5 QUINCKE PK (NEEDLE) ×3 IMPLANT
OBTURATOR OPTICALSTD 8 DVNC (TROCAR) ×3 IMPLANT
PACK GENERAL/GYN (CUSTOM PROCEDURE TRAY) ×3 IMPLANT
PACK ROBOT GYN CUSTOM WL (TRAY / TRAY PROCEDURE) ×3 IMPLANT
PAD POSITIONING PINK XL (MISCELLANEOUS) ×3 IMPLANT
PORT ACCESS TROCAR AIRSEAL 12 (TROCAR) IMPLANT
SCISSORS LAP 5X45 EPIX DISP (ENDOMECHANICALS) IMPLANT
SCISSORS MNPLR CVD DVNC XI (INSTRUMENTS) ×3 IMPLANT
SCRUB CHG 4% DYNA-HEX 4OZ (MISCELLANEOUS) IMPLANT
SEAL UNIV 5-12 XI (MISCELLANEOUS) ×12 IMPLANT
SET TRI-LUMEN FLTR TB AIRSEAL (TUBING) ×3 IMPLANT
SPIKE FLUID TRANSFER (MISCELLANEOUS) ×3 IMPLANT
SPONGE DRAIN TRACH 4X4 STRL 2S (GAUZE/BANDAGES/DRESSINGS) IMPLANT
SPONGE T-LAP 18X18 ~~LOC~~+RFID (SPONGE) IMPLANT
SURGIFLO W/THROMBIN 8M KIT (HEMOSTASIS) IMPLANT
SUT ETHILON 2 0 PS N (SUTURE) IMPLANT
SUT MNCRL AB 4-0 PS2 18 (SUTURE) ×3 IMPLANT
SUT PDS AB 0 CT 36 (SUTURE) ×6 IMPLANT
SUT PDS AB 1 TP1 96 (SUTURE) IMPLANT
SUT STRATA PDS 0 30 CT-2.5 (SUTURE) IMPLANT
SUT V-LOC 180 0-0 GS22 (SUTURE) IMPLANT
SUT VIC AB 0 CT1 27XBRD ANTBC (SUTURE) IMPLANT
SUT VIC AB 2-0 CT1 TAPERPNT 27 (SUTURE) IMPLANT
SUT VIC AB 2-0 SH 27X BRD (SUTURE) IMPLANT
SUT VIC AB 3-0 SH 8-18 (SUTURE) ×3 IMPLANT
SUT VIC AB 4-0 PS2 18 (SUTURE) ×6 IMPLANT
SUT VICRYL 0 27 CT2 27 ABS (SUTURE) ×3 IMPLANT
SUT VICRYL 2 0 18 UND BR (SUTURE) ×6 IMPLANT
SUT VICRYL 2-0 SH 8X27 (SUTURE) ×3 IMPLANT
SUT VLOC 180 0 9IN GS21 (SUTURE) IMPLANT
SUTURE STRATFX 0 PDS+ CT-2 23 (SUTURE) IMPLANT
SYR 10ML LL (SYRINGE) IMPLANT
SYSTEM BAG RETRIEVAL 10MM (BASKET) IMPLANT
SYSTEM WOUND ALEXIS 18CM MED (MISCELLANEOUS) IMPLANT
TOWEL OR 17X26 10 PK STRL BLUE (TOWEL DISPOSABLE) ×3 IMPLANT
TOWEL ~~LOC~~+RFID 17X26 BLUE (SPONGE) ×3 IMPLANT
TRAP SPECIMEN MUCUS 40CC (MISCELLANEOUS) IMPLANT
TRAY FOLEY MTR SLVR 16FR STAT (SET/KITS/TRAYS/PACK) ×3 IMPLANT
TROCAR PORT AIRSEAL 5X120 (TROCAR) IMPLANT
TROCAR XCEL NON-BLD 5MMX100MML (ENDOMECHANICALS) ×3 IMPLANT
UNDERPAD 30X36 HEAVY ABSORB (UNDERPADS AND DIAPERS) ×6 IMPLANT
WATER STERILE IRR 1000ML POUR (IV SOLUTION) ×3 IMPLANT
YANKAUER SUCT BULB TIP 10FT TU (MISCELLANEOUS) IMPLANT

## 2024-04-03 NOTE — Anesthesia Postprocedure Evaluation (Signed)
 Anesthesia Post Note  Patient: [name]  Procedure(s) Performed: HYSTERECTOMY, TOTAL, ROBOT-ASSISTED, LAPAROSCOPIC, WITH BILATERAL SALPINGO-OOPHORECTOMY (Bilateral) INJECTION, FOR SENTINEL LYMPH NODE IDENTIFICATION LYMPH NODE BIOPSY LYSIS, ADHESIONS, ROBOT-ASSISTED, LAPAROSCOPIC LYSIS OF ADHESIONS     Patient location during evaluation: PACU Anesthesia Type: General Level of consciousness: awake and alert Pain management: pain level controlled Vital Signs Assessment: post-procedure vital signs reviewed and stable Respiratory status: spontaneous breathing, nonlabored ventilation and respiratory function stable Cardiovascular status: blood pressure returned to baseline Postop Assessment: no apparent nausea or vomiting Anesthetic complications: no   No notable events documented.  Last Vitals:  Vitals:   04/03/24 1745 04/03/24 1800  BP: (!) 179/85 (!) 186/91  Pulse: 62 62  Resp: (!) 22 17  Temp:  36.6 C  SpO2: 92% 96%    Last Pain:  Vitals:   04/03/24 1800  TempSrc:   PainSc: 0-No pain                 Vertell Row

## 2024-04-03 NOTE — Anesthesia Procedure Notes (Signed)
 Procedure Name: Intubation Date/Time: 04/03/2024 1:18 PM  Performed by: Cena Epps, CRNAPre-anesthesia Checklist: Patient identified, Emergency Drugs available, Suction available and Patient being monitored Patient Re-evaluated:Patient Re-evaluated prior to induction Oxygen Delivery Method: Circle System Utilized Preoxygenation: Pre-oxygenation with 100% oxygen Induction Type: IV induction Ventilation: Mask ventilation without difficulty Laryngoscope Size: Mac and 3 Grade View: Grade I Tube type: Oral Tube size: 7.0 mm Number of attempts: 1 Airway Equipment and Method: Stylet and Oral airway Placement Confirmation: ETT inserted through vocal cords under direct vision, positive ETCO2 and breath sounds checked- equal and bilateral Secured at: 23 cm Tube secured with: Tape Dental Injury: Teeth and Oropharynx as per pre-operative assessment

## 2024-04-03 NOTE — Transfer of Care (Signed)
 Immediate Anesthesia Transfer of Care Note  Patient: [name]  Procedure(s) Performed: HYSTERECTOMY, TOTAL, ROBOT-ASSISTED, LAPAROSCOPIC, WITH BILATERAL SALPINGO-OOPHORECTOMY (Bilateral) INJECTION, FOR SENTINEL LYMPH NODE IDENTIFICATION LYMPH NODE BIOPSY LYSIS, ADHESIONS, ROBOT-ASSISTED, LAPAROSCOPIC LYSIS OF ADHESIONS  Patient Location: PACU  Anesthesia Type:General  Level of Consciousness: drowsy and patient cooperative  Airway & Oxygen Therapy: Patient Spontanous Breathing and Patient connected to face mask oxygen  Post-op Assessment: Report given to RN and Post -op Vital signs reviewed and stable  Post vital signs: Reviewed and stable  Last Vitals:  Vitals Value Taken Time  BP 186/88 04/03/24 17:10  Temp    Pulse 62 04/03/24 17:14  Resp 21 04/03/24 17:14  SpO2 91 % 04/03/24 17:14  Vitals shown include unfiled device data.  Last Pain:  Vitals:   04/03/24 1120  TempSrc:   PainSc: 0-No pain         Complications: No notable events documented.

## 2024-04-03 NOTE — Op Note (Signed)
 OPERATIVE NOTE  Pre-operative Diagnosis: endometrial cancer grade 2, incisional hernia  Post-operative Diagnosis: same, significant retroperitoneal adiposity, abdominal adhesions  Operation: Robotic-assisted laparoscopic total hysterectomy with bilateral salpingo-oophorectomy, SLN injection and biopsy, lysis of adhesions, resection of left adnexal mass, cystoscopy Morbid obesity requiring additional OR personnel for positioning and retraction. Obesity made retroperitoneal visualization limited and increased the complexity of the case and necessitated additional instrumentation for retraction. Obesity related complexity increased the duration of the procedure by 90 minutes.   Surgeon: Viktoria Crank MD  Assistant Surgeon: Eleanor Epps, NP (an NP assistant was necessary for tissue manipulation, management of robotic instrumentation, retraction and positioning due to the complexity of the case and hospital policies).   Anesthesia: GET  Urine Output: 400 cc  Operative Findings:  : On exam under anesthesia, somewhat mobile uterus.  On intra-abdominal entry, normal upper abdominal survey.  Large lower abdominal hernia noted that appears to be a defect from a transverse incision with some omentum and small bowel adherent to the anterior abdominal wall.  There are also loops of the terminal ileum and what appears to be the cecum that are tethered to the right abdominal wall.  Otherwise, normal-appearing small bowel.  There are some physiologic adhesions of the epiploica to the left abdomen and at the level of the left pelvic brim.  The uterus itself is approximately 8-10 cm and somewhat bulbous.  Normal-appearing right fallopian tube and ovary.  Left fallopian tube and ovary normal in appearance although there is an approximately 4 cm mass that appears to be emanating from the left ovary versus in close proximity to it that is adherent to the lateral aspect of the rectal mesentery along the deep left  pelvic sidewall.  It is unclear whether this is from a prior infection versus represents site of metastatic disease.  This tissue was quite friable but was ultimately able to be removed with resection of the peritoneum and very superficial part of the lateral rectal mesentery and portion of the right pelvic sidewall peritoneum.  Significant adiposity of the retroperitoneum and deep pelvis noted.  Hemosiderin deposits on the peritoneum of the pelvis.  Mapping successful to a right obturator sentinel lymph node and left external iliac sentinel lymph node.  No enlarged lymph nodes.  No ascites. On cystoscopy, bladder dome intact, efflux seen from bilateral ureteral orifices.   Estimated Blood Loss:  125 cc      Total IV Fluids: see I&O flowsheet         Specimens: uterus, cervix, bilateral tubes and ovaries, left adnexal mass, right obturator SLN, left external iliac SLN         Complications:  None apparent; patient tolerated the procedure well.         Disposition: PACU - hemodynamically stable.  Procedure Details  The patient was seen in the Holding Room. The risks, benefits, complications, treatment options, and expected outcomes were discussed with the patient.  The patient concurred with the proposed plan, giving informed consent.  The site of surgery properly noted/marked. The patient was identified as [name] and the procedure verified as a Robotic-assisted hysterectomy with bilateral salpingo oophorectomy with SLN biopsy.   After induction of anesthesia, the patient was draped and prepped in the usual sterile manner. Patient was placed in supine position after anesthesia and draped and prepped in the usual sterile manner as follows: Her arms were tucked to her side with all appropriate precautions.  The patient was secured to the bed using padding and  tape across her chest.  The patient was placed in the semi-lithotomy position in Beaver stirrups.  The perineum and vagina were prepped  with CHG. The patient's abdomen was prepped with ChloraPrep and she was draped after the prep had been allowed to dry for 3 minutes.  A Time Out was held and the above information confirmed.  The urethra was prepped with Betadine . Foley catheter was placed.  Given significant prolapse, speculum was not necessary as the cervix was at the vaginal opening.  The cervix was grasped with a single-tooth tenaculum. 2mg  total of ICG was injected into the cervical stroma at 2 and 9 o'clock with 1cc injected at a 1cm and 2mm depth (concentration 0.5mg /ml) in all locations. The cervix was dilated with Fredirick dilators.  The ZUMI uterine manipulator with a medium colpotomizer ring was placed without difficulty.  A pneum occluder balloon was placed over the manipulator.  OG tube placement was confirmed and to suction.   Next, a 10 mm skin incision was made 1 cm below the subcostal margin in the midclavicular line.  The 5 mm Optiview port and scope was used for direct entry.  Opening pressure was under 10 mm CO2.  The abdomen was insufflated and the findings were noted as above.   At this point and all points during the procedure, the patient's intra-abdominal pressure did not exceed 15 mmHg.  Given significantly adherent small and large bowel along the right abdominal wall, decision was made to place [facility] ports in a slightly different orientation than typical.  An 8 mm skin incision was made superior to the umbilicus and a right mid abdominal port was placed approximately 6-8 cm lateral to the midline port and another port placed approximately 8 cm lateral to the umbilicus on the left.  A fourth arm was placed on the left.  The 5 mm assist trocar was exchanged for a 12 mm airseal port. All ports were placed under direct visualization.  The patient was placed in steep Trendelenburg.  The surgery was turned over to Dr. Lyndel at this time.    After he reduced the hernia and the robot was docked, decision was made not to  lyse any of the adhesions of the bowel along the right abdominal wall.  Just above the pelvic brim, it was very challenging to discern the edges of the adherent small bowel ended along the peritoneum.  Similarly, given the size of the patient's incisional hernia, the decision was not to proceed with hernia repair at the time of her hysterectomy.  The right and left peritoneum were opened parallel to the IP ligament to open the retroperitoneal spaces bilaterally.  Along the right, to mobilize the infundibulopelvic ligament enough medially to find the ureter, the peritoneum had to be taken up to and in close proximity of the adherent small bowel.  Much care was taken to avoid use of cautery in very close proximity to or on the discernible bowel wall.  The round ligaments were transected. The SLN mapping was performed in bilateral pelvic basins. After identifying the ureters, which was done with some difficulty, the para rectal and paravesical spaces were opened up entirely with careful dissection below the level of the ureters bilaterally and to the depth of the uterine artery origin in order to skeletonize the uterine web and ensure visualization of all parametrial channels. The para-aortic basins were carefully exposed and evaluated for isolated para-aortic SLN's. Lymphatic channels were identified travelling to the following visualized sentinel lymph node's:  right obturator SLN and left external iliac SLN. These SLNs were separated from their surrounding lymphatic tissue, removed and sent for permanent pathology.  Along the left, a mass was noted to be adherent to the left adnexa and peritoneum over the rectal mesentery.  On inspection of this area, resection of the peritoneum and superficial mesentery appeared to be required to completely resect the abnormal mass.  Monopolar electrocautery was used to cauterize and resect the peritoneum, ultimately freeing the mass posteriorly and medially from the  underlying rectal mesentery.  Laterally, the mass was somewhat adherent to the broad ligament.  After identification of the ureter, the lateral attachment was also cauterized and transected and the mass was freed inferiorly from the peritoneum of the rectal mesentery.  During resection of this mass, given no tissue integrity, there were pieces of the mass that did not stay with the main specimen.  Ultimately, once the base of the mass had been resected, it was placed in an Endo Catch bag along with the pieces that had fractured off during its excision.  Attention was then turned to the the hysterectomy.  The ureter was again noted to be on the medial leaf of the broad ligament.  The peritoneum above the ureter was incised and stretched and the infundibulopelvic ligament was skeletonized, cauterized and cut.  The posterior peritoneum was taken down to the level of the KOH ring.  The anterior peritoneum was also taken down.  The bladder flap was created to the level of the KOH ring.  The uterine artery on the right side was skeletonized, cauterized and cut in the normal manner.  A similar procedure was performed on the left.  The colpotomy was made and the uterus, cervix, bilateral ovaries and tubes were amputated and delivered through the vagina.  The Endo Catch bag containing the left adnexal mass specimen was also delivered through the vagina.  Pedicles were inspected and excellent hemostasis was achieved.    The colpotomy at the vaginal cuff was closed with 0 Vicryl with a figure of eight stitch at each apex and 0 Stratafix to close the midportion of the cuff in two layers in running manner.  Copious irrigation was used and excellent hemostasis was achieved.  Arista was placed over surgical beds and over the rectal mesentery where the mass had been removed.   Attention was then turned below. The bladder was backfilled with 200 cc of sterile fluid. The foley catheter was then removed and cystoscopy was  performed with findings noted above. Foley catheter was replaced  At this point in the procedure was completed.  Robotic instruments were removed under direct visulaization.  The robot was undocked. The fascia at the 12 mm port was closed with 0 Vicryl using a PMI fascial closure device under direct visualization.  The subcuticular tissue was closed with 4-0 Vicryl and the skin was closed with 4-0 Monocryl in a subcuticular manner.  Dermabond was applied.    The vagina was swabbed with minimal bleeding noted. All sponge, lap and needle counts were correct x  3.   The patient was transferred to the recovery room in stable condition.  Comer Dollar, MD

## 2024-04-03 NOTE — Brief Op Note (Signed)
 04/03/2024  4:50 PM  PATIENT:  [name]  78 y.o. female  PRE-OPERATIVE DIAGNOSIS:  Endometrial cancer  POST-OPERATIVE DIAGNOSIS:  Endometrial cancer  PROCEDURE:  Procedure(s) with comments: HYSTERECTOMY, TOTAL, ROBOT-ASSISTED, LAPAROSCOPIC, WITH BILATERAL SALPINGO-OOPHORECTOMY (Bilateral) - WITH POSSIBLE LAPAROTOMY INJECTION, FOR SENTINEL LYMPH NODE IDENTIFICATION (N/A) LYMPH NODE BIOPSY (N/A) LYSIS OF ADHESIONS (N/A) LYSIS, ADHESIONS, ROBOT-ASSISTED, LAPAROSCOPIC  SURGEON:  Surgeons and Role: Panel 1:    DEWAINE Viktoria Comer JONELLE, MD - Primary Panel 2:    * Stechschulte, Deward PARAS, MD - Primary   ASSISTANTS: Eleanor Epps, NP   ANESTHESIA:   general  EBL:  125 mL   BLOOD ADMINISTERED:none  DRAINS: none   LOCAL MEDICATIONS USED:  MARCAINE      SPECIMEN:  uterus, cervix, tubes and ovaries, right obturator SLN, left external iliac SLN, left adnexal mass  DISPOSITION OF SPECIMEN:  PATHOLOGY  COUNTS:  YES  TOURNIQUET:  * No tourniquets in log *  DICTATION: .Note written in EPIC  PLAN OF CARE: Admit for overnight observation  PATIENT DISPOSITION:  PACU - hemodynamically stable.   Delay start of Pharmacological VTE agent (>24hrs) due to surgical blood loss or risk of bleeding: not applicable

## 2024-04-03 NOTE — H&P (Signed)
 Admitting Physician: Deward PARAS Yarenis Cerino  Service: General Surgery  CC: Incisional hernia  Subjective   HPI: [name] is an 78 y.o. female who is here for hysterectomy, and has an incisional hernia that may need addressed during surgery.  Past Medical History:  Diagnosis Date   Anemia    Anxiety    Cancer (HCC)    breast treated with radiation  endometrial   Chronic fatigue syndrome    Chronic low back pain    followed by dr paras. mavis   CKD (chronic kidney disease), stage III (HCC)    followed by pcp   Constipation 02/20/2024   02-21-2024  pt stated viist w/ PCP due to constipation given stool softner and abdominal xray done   Coronary artery calcification 05/2022   cardiologist--- dr s. michele;   CT 11/ 2023 ;  CCTA 09-04-2022  calcium  score=36.9 involving LAD w/ mild nonob dz;  NUC 08-14-2022  LR w/ mild perfusion defect reversible, nuclear ef 60%   DDD (degenerative disc disease), lumbar    Depression    Diverticulosis of colon    DOE (dyspnea on exertion)    02-21-2024  sob w/ stairs/ long distance walking/  okay with household chores ,  per cardiologist note 09/2023  chronic DOE stable   Edema of both lower extremities    02-21-2024 pt stated left > right   Flat foot, acquired    GERD (gastroesophageal reflux disease)    History of adenomatous polyp of colon    GI-- dr shila   History of gout    History of kidney stones    History of left breast cancer 2013   oncologist--- dr layla (lov 09-06-2017 released w/ no recurrence) ;  08-30-2011  s/p left lumpectomy w/ node dissection;  Stage IAG1, IDC, ER/PR+, nodes negative;  completed IMRT 11-24-2011 ,  completed tamoxifen  02/ 2019   Hyperlipidemia    Hypertension    Lumbar stenosis with neurogenic claudication    per pt left leg   OA (osteoarthritis)    OSA on CPAP 07/2004   followed by [facility]--- dr jude;   sleep study in epic 08-05-2004 severe osa, (02-21-2024 pt stated uses cpap nightly)   Peripheral  neuropathy    Personal history of radiation therapy 09/2011   left breast/ chest wall   10-29-2011  to  11-24-2011  6100 cGy   Pneumonia    Rash    02-21-2024  pt stated has rash under abd fold and between thighs   Sickle cell trait (HCC)    Thickened endometrium    Tinnitus of both ears    Type 2 diabetes mellitus (HCC)    followed by pcp:   (02-21-2024  pt stated checks  blood sugar 2 times montly , not fasting)   Wears partial dentures    upper    Past Surgical History:  Procedure Laterality Date   APPENDECTOMY  1978   BIOPSY  10/06/2021   Procedure: BIOPSY;  Surgeon: shila Gustav GAILS, MD;  Location: [facility];  Service: Endoscopy;;   BLEPHAROPLASTY Right    age 76s---  right upper eyelid   BREAST LUMPECTOMY WITH NEEDLE LOCALIZATION Right 02/06/2013   Procedure: RIGHT BREAST LUMPECTOMY WITH NEEDLE LOCALIZATION;  Surgeon: Vicenta DELENA Poli, MD;  Location: [facility] OR;  Service: General;  Laterality: Right;   BREAST LUMPECTOMY WITH NEEDLE LOCALIZATION AND AXILLARY SENTINEL LYMPH NODE BX Left 08/30/2011   @[facility]  by dr d. poli;   CATARACT EXTRACTION W/ INTRAOCULAR LENS  IMPLANT Bilateral 2018   COLONOSCOPY WITH PROPOFOL  N/A 10/27/2015   Procedure: COLONOSCOPY WITH PROPOFOL ;  Surgeon: Gustav Shila GAILS, MD;  Location: [facility];  Service: Endoscopy;  Laterality: N/A;   COLONOSCOPY WITH PROPOFOL  N/A 10/06/2021   Procedure: COLONOSCOPY WITH PROPOFOL ;  Surgeon: Shila Gustav GAILS, MD;  Location: [facility];  Service: Endoscopy;  Laterality: N/A;   DILATATION & CURRETTAGE/HYSTEROSCOPY WITH RESECTOCOPE N/A 02/28/2024   Procedure: DILATATION & CURETTAGE/HYSTEROSCOPY;  Surgeon: Okey Leader, MD;  Location: [facility] OR;  Service: Gynecology;  Laterality: N/A;   HARDWARE REMOVAL Left 04/20/2015   Procedure: HARDWARE REMOVAL LEFT LUMBAR FIVE SCREW;  Surgeon: Darina Boehringer, MD;  Location: [facility] OR;  Service: Neurosurgery;  Laterality: Left;   HYSTEROSCOPY WITH D & C  10/04/2005   @[facility]  by dr a.  okey;  polypectomy   POLYPECTOMY  10/06/2021   Procedure: POLYPECTOMY;  Surgeon: Shila Gustav GAILS, MD;  Location: [facility];  Service: Endoscopy;;   POSTERIOR FUSION LUMBAR SPINE  04/19/2020   @[facility]  by dr jonelle. kritzer:  Laminectomy / Decompression  L2--3 /  interbody fusion w/ segmental instrumentation L2--L5   POSTERIOR LUMBAR FUSION 2 LEVEL  12/29/2014   @[facility]  by dr jonelle. kritzer;   L3--4  and L4--5   THYROID  CYST EXCISION  1994   benign   TOE SURGERY Left    TOTAL KNEE ARTHROPLASTY Left 08/19/2004   @[facility]  by dr r. gioffre   TOTAL KNEE ARTHROPLASTY Right 05/24/2005   @[facility]  by dr r. gioffre   TRANSPHENOIDAL PITUITARY RESECTION  01/25/2010   @[facility]  by dr r. kritzer/ dr c. 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    Family History  Problem Relation Age of Onset   Stroke Mother    Diabetes Mother    Hypertension Mother    Hyperlipidemia Mother    Obesity Mother    Heart disease Father    Clotting disorder Father    Stroke Father    Hypertension Father    Heart Problems Father    Breast cancer Sister 54   Colon cancer Neg Hx    Neuropathy Neg Hx     Social:  reports that she has never smoked. She has never used smokeless tobacco. She reports that she does not drink alcohol and does not use drugs.  Allergies:  Allergies  Allergen Reactions   Lyrica  [Pregabalin ]     Dizziness   Cymbalta [Duloxetine Hcl] Other (See Comments)    Stomach pain   Gabapentin  Itching   Lisinopril  Swelling   Maxzide [Hydrochlorothiazide -Triamterene] Other (See Comments)    Cramping    Niacin  Itching   Pravastatin Sodium Other (See Comments)    Headache    Simvastatin  Other (See Comments)    Memory loss   Lodine [Etodolac] Itching    Medications: Current Outpatient Medications  Medication Instructions   acetaminophen  (TYLENOL ) 1,000 mg, Oral, Daily PRN   aspirin  EC 81 mg, Oral, Daily, Swallow whole.   atorvastatin  (LIPITOR) 20 MG tablet TAKE 1 TABLET(20 MG) BY  MOUTH AT BEDTIME   Cholecalciferol  (VITAMIN D -3) 25 MCG (1000 UT) CAPS 1 capsule, Oral, Daily   clotrimazole-betamethasone  (LOTRISONE) cream 1 Application, Topical, 2 times daily   escitalopram  (LEXAPRO ) 20 mg, Oral, Daily   ezetimibe  (ZETIA ) 10 mg, Oral, Daily at bedtime   Glycerin-Polysorbate 80 (REFRESH DRY EYE THERAPY OP) 1 drop, Both Eyes, 2 times daily   hydrALAZINE (APRESOLINE) 25 mg, Daily   hydrochlorothiazide  (HYDRODIURIL ) 25 mg, Oral, Daily   HYDROcodone -acetaminophen  (NORCO/VICODIN)  5-325 MG tablet 1-2 tablets, Oral, Every 6 hours PRN   hydrOXYzine (ATARAX) 25 mg, Oral, 2 times daily   irbesartan  (AVAPRO ) 300 mg, Oral, Daily at bedtime   metFORMIN  (GLUCOPHAGE ) 500 mg, Oral, Daily with breakfast   nystatin powder 1 Application, Topical, 2 times daily   verapamil  (VERELAN ) 240 mg, Oral, Daily at bedtime    ROS - all of the below systems have been reviewed with the patient and positives are indicated with bold text General: chills, fever or night sweats Eyes: blurry vision or double vision ENT: epistaxis or sore throat Allergy/Immunology: itchy/watery eyes or nasal congestion Hematologic/Lymphatic: bleeding problems, blood clots or swollen lymph nodes Endocrine: temperature intolerance or unexpected weight changes Breast: new or changing breast lumps or nipple discharge Resp: cough, shortness of breath, or wheezing CV: chest pain or dyspnea on exertion GI: as per HPI GU: dysuria, trouble voiding, or hematuria MSK: joint pain or joint stiffness Neuro: TIA or stroke symptoms Derm: pruritus and skin lesion changes Psych: anxiety and depression  Objective   PE There were no vitals taken for this visit. Constitutional: NAD; conversant; no deformities Eyes: Moist conjunctiva; no lid lag; anicteric; PERRL Neck: Trachea midline; no thyromegaly Lungs: Normal respiratory effort; no tactile fremitus CV: RRR; no palpable thrills; no pitting edema GI: Abd Lower abdominal hernia  dificult to palpate.; no palpable hepatosplenomegaly MSK: Normal range of motion of extremities; no clubbing/cyanosis Psychiatric: Appropriate affect; alert and oriented x3 Lymphatic: No palpable cervical or axillary lymphadenopathy  No results found for this or any previous visit (from the past 24 hours).  Imaging Orders  No imaging studies ordered today  CT Abd/Pel 09/17/23  IMPRESSION: 1. No acute abdominopelvic findings. 2. Ill-defined appearance of the endometrium, which appears thickened up to 1.9 cm. Recommend further evaluation with nonemergent pelvic ultrasound examination. 3. Cholelithiasis. 4. Punctate nonobstructing right lower pole stone. 5. Colonic diverticulosis without acute diverticulitis. 6. Aortic Atherosclerosis (ICD10-I70.0).  11.4 cm wide by 3.9 cm incisional hernia from previous appendectomy    Assessment and Plan   [name] is an 78 y.o. female with a ventral hernia undergoing gynecology oncology procedure with Dr. Viktoria.  I explained, I will be present for the surgery to address the hernia which may involve doing nothing, or repairing the hernia with suture, or repairing the hernia with mesh.  We discussed the procedure, its risks, benefits and alternatives and the patient granted consent to proceed.  Deward JINNY Foy, MD  [facility] Use [email or web address] to contact on call provider

## 2024-04-03 NOTE — Progress Notes (Signed)
   04/03/24 1959  BiPAP/CPAP/SIPAP  BiPAP/CPAP/SIPAP Pt Type Adult  Reason BIPAP/CPAP not in use Non-compliant (Patient refuses CPAP.)

## 2024-04-03 NOTE — Op Note (Signed)
   Patient: [name] (18-Jul-1946, [id number])  Date of Surgery: 04/03/2024  Preoperative Diagnosis: Endometrial cancer, Ventral hernia  Postoperative Diagnosis: Endometrial cancer, Ventral hernia  Surgical Procedure: Please see Dr. Lewie note for her procedure details  Laparoscopic lysis of adhesions   Operative Team Members:  Primary Surgeon: Comer Dollar, MD - Primary Secondary Surgeon: Deward Foy, MD - Primary   Anesthesiologist: Osmel Dykstra Lamarr BRAVO, MD CRNA: Memory Armida LABOR, CRNA; Cena Epps, CRNA   Anesthesia: General   Fluids:  No intake/output data recorded.  Complications: None  Drains:  none   Specimen:  ID Type Source Tests Collected by Time Destination  1 : Left External Iliac Sentinel Lymph Node Tissue PATH Sentinel Lymph Node SURGICAL PATHOLOGY Dollar Comer SAUNDERS, MD 04/03/2024 1426   2 : Left Adnexal Mass Tissue PATH Gyn tumor resection SURGICAL PATHOLOGY Dollar Comer SAUNDERS, MD 04/03/2024 1450      Disposition:  PACU - hemodynamically stable.  Plan of Care: Per Dr. Dollar    Indications for Procedure: [name] is a 78 y.o. female who presented with endometrial cancer.  I was asked by Dr. Dollar to assist due to a large lower midline incisional hernia from previous appendectomy.  I recommended possible minimally invasive ventral hernia repair at time of hysterectomy depending on appearance of hernia.  The procedure itself as well as its risks, benefits and alternatives were discussed.  The risks discussed included but were not limited to the risk of infection, bleeding, damage to nearby structures, and recurrent hernia.  After a full discussion and all questions answered the patient granted consent to proceed.  Findings: large, broad based lower abdominal incisional hernia with some adhesions to the omentum and small intestine   Description of Procedure:   During the laparoscopic portion of the procedure, I lysed some adhesions  between the hernia sac, the omentum and small intestine in order to expose the pelvis.  I used sharp dissection using scissors and blunt dissection using a Prestige grasper.  The hernia involved a broad defect, consistent with preoperative CT measurement (11.4 cm x 3.9 cm), and relatively small hernia sac volume.  The hernia sac was inspected, and there were no problematic small hernia defects that could cause bowel incarceration postoperatively.  I felt a formal repair, or primary repair of the hernia at this time is not necessary due to the low risk of acute hernia emergency in the future.  Dr. Dollar proceeded with her portion of the procedure, please see her notes for more details.    Deward Foy, MD General, Bariatric, & Minimally Invasive Surgery [facility] [place]

## 2024-04-03 NOTE — Interval H&P Note (Signed)
 History and Physical Interval Note:  04/03/2024 10:50 AM  [name]  has presented today for surgery, with the diagnosis of Endometrial cancer.  The various methods of treatment have been discussed with the patient and family. After consideration of risks, benefits and other options for treatment, the patient has consented to  Procedure(s) with comments: HYSTERECTOMY, TOTAL, ROBOT-ASSISTED, LAPAROSCOPIC, WITH BILATERAL SALPINGO-OOPHORECTOMY (Bilateral) - WITH POSSIBLE LAPAROTOMY INJECTION, FOR SENTINEL LYMPH NODE IDENTIFICATION (N/A) LYMPH NODE BIOPSY (N/A) REPAIR, HERNIA, INCISIONAL (N/A) as a surgical intervention.  The patient's history has been reviewed, patient examined, no change in status, stable for surgery.  I have reviewed the patient's chart and labs.  Questions were answered to the patient's satisfaction.     [name]

## 2024-04-04 ENCOUNTER — Ambulatory Visit (HOSPITAL_COMMUNITY)

## 2024-04-04 ENCOUNTER — Encounter (HOSPITAL_COMMUNITY): Payer: Self-pay | Admitting: Gynecologic Oncology

## 2024-04-04 DIAGNOSIS — Z79899 Other long term (current) drug therapy: Secondary | ICD-10-CM | POA: Diagnosis not present

## 2024-04-04 DIAGNOSIS — N183 Chronic kidney disease, stage 3 unspecified: Secondary | ICD-10-CM | POA: Diagnosis not present

## 2024-04-04 DIAGNOSIS — M1990 Unspecified osteoarthritis, unspecified site: Secondary | ICD-10-CM | POA: Diagnosis not present

## 2024-04-04 DIAGNOSIS — K660 Peritoneal adhesions (postprocedural) (postinfection): Secondary | ICD-10-CM | POA: Diagnosis not present

## 2024-04-04 DIAGNOSIS — K5900 Constipation, unspecified: Secondary | ICD-10-CM | POA: Diagnosis not present

## 2024-04-04 DIAGNOSIS — C7982 Secondary malignant neoplasm of genital organs: Secondary | ICD-10-CM | POA: Diagnosis not present

## 2024-04-04 DIAGNOSIS — Z853 Personal history of malignant neoplasm of breast: Secondary | ICD-10-CM | POA: Diagnosis not present

## 2024-04-04 DIAGNOSIS — D649 Anemia, unspecified: Secondary | ICD-10-CM | POA: Diagnosis not present

## 2024-04-04 DIAGNOSIS — I129 Hypertensive chronic kidney disease with stage 1 through stage 4 chronic kidney disease, or unspecified chronic kidney disease: Secondary | ICD-10-CM | POA: Diagnosis not present

## 2024-04-04 DIAGNOSIS — G4733 Obstructive sleep apnea (adult) (pediatric): Secondary | ICD-10-CM | POA: Diagnosis not present

## 2024-04-04 DIAGNOSIS — I2510 Atherosclerotic heart disease of native coronary artery without angina pectoris: Secondary | ICD-10-CM | POA: Diagnosis not present

## 2024-04-04 DIAGNOSIS — Z7984 Long term (current) use of oral hypoglycemic drugs: Secondary | ICD-10-CM | POA: Diagnosis not present

## 2024-04-04 DIAGNOSIS — C541 Malignant neoplasm of endometrium: Secondary | ICD-10-CM | POA: Diagnosis not present

## 2024-04-04 DIAGNOSIS — E1142 Type 2 diabetes mellitus with diabetic polyneuropathy: Secondary | ICD-10-CM | POA: Diagnosis not present

## 2024-04-04 DIAGNOSIS — M7989 Other specified soft tissue disorders: Secondary | ICD-10-CM

## 2024-04-04 DIAGNOSIS — K432 Incisional hernia without obstruction or gangrene: Secondary | ICD-10-CM | POA: Diagnosis not present

## 2024-04-04 LAB — BASIC METABOLIC PANEL WITH GFR
Anion gap: 12 (ref 5–15)
BUN: 12 mg/dL (ref 8–23)
CO2: 22 mmol/L (ref 22–32)
Calcium: 10.4 mg/dL — ABNORMAL HIGH (ref 8.9–10.3)
Chloride: 104 mmol/L (ref 98–111)
Creatinine, Ser: 1.27 mg/dL — ABNORMAL HIGH (ref 0.44–1.00)
GFR, Estimated: 43 mL/min — ABNORMAL LOW (ref >60)
Glucose, Bld: 127 mg/dL — ABNORMAL HIGH (ref 70–99)
Potassium: 4.5 mmol/L (ref 3.5–5.1)
Sodium: 138 mmol/L (ref 135–145)

## 2024-04-04 LAB — CBC
HCT: 35.7 % — ABNORMAL LOW (ref 36.0–46.0)
Hemoglobin: 10.6 g/dL — ABNORMAL LOW (ref 12.0–15.0)
MCH: 23.7 pg — ABNORMAL LOW (ref 26.0–34.0)
MCHC: 29.7 g/dL — ABNORMAL LOW (ref 30.0–36.0)
MCV: 79.7 fL — ABNORMAL LOW (ref 80.0–100.0)
Platelets: 292 K/uL (ref 150–400)
RBC: 4.48 MIL/uL (ref 3.87–5.11)
RDW: 16.8 % — ABNORMAL HIGH (ref 11.5–15.5)
WBC: 10.7 K/uL — ABNORMAL HIGH (ref 4.0–10.5)
nRBC: 0 % (ref 0.0–0.2)

## 2024-04-04 LAB — GLUCOSE, CAPILLARY
Glucose-Capillary: 136 mg/dL — ABNORMAL HIGH (ref 70–99)
Glucose-Capillary: 120 mg/dL — ABNORMAL HIGH (ref 70–99)
Glucose-Capillary: 113 mg/dL — ABNORMAL HIGH (ref 70–99)

## 2024-04-04 MED ORDER — HYDROCODONE-ACETAMINOPHEN 5-325 MG PO TABS: 1.0000 | ORAL_TABLET | Freq: Four times a day (QID) | ORAL | 0 refills | Status: DC | PRN

## 2024-04-04 NOTE — Progress Notes (Signed)
 VASCULAR LAB    Left lower extremity venous duplex has been performed.  See CV proc for preliminary results.   Deray Dawes, RVT 04/04/2024, 12:06 PM

## 2024-04-04 NOTE — Progress Notes (Signed)
 1 Day Post-Op Procedure(s) (LRB): HYSTERECTOMY, TOTAL, ROBOT-ASSISTED, LAPAROSCOPIC, WITH BILATERAL SALPINGO-OOPHORECTOMY (Bilateral) INJECTION, FOR SENTINEL LYMPH NODE IDENTIFICATION (N/A) LYMPH NODE BIOPSY (N/A) LYSIS OF ADHESIONS (N/A) LYSIS, ADHESIONS, ROBOT-ASSISTED, LAPAROSCOPIC  Subjective: Patient reports doing well. Abdominal soreness is improving. Ambulated to the bathroom with walker last pm and tolerated this well. No dizziness, lightheadedness. Denies chest pain, dyspnea. Has not eaten solid food. Denies nausea or emesis. Has not passed flatus yet but feels the sensation. Has had increased left pedal edema over the past week. Denies calf pain. Has had intermittent edema in that foot in the past and also has neuropathy.  Objective: Vital signs in last 24 hours: Temp:  [97.6 F (36.4 C)-98.8 F (37.1 C)] 97.9 F (36.6 C) (09/05 0528) Pulse Rate:  [62-77] 71 (09/05 0528) Resp:  [16-22] 18 (09/05 0528) BP: (133-197)/(61-91) 158/61 (09/05 0528) SpO2:  [91 %-100 %] 100 % (09/05 0528) Weight:  [274 lb (124.3 kg)] 274 lb (124.3 kg) (09/04 1120) Last BM Date : 04/03/24  Intake/Output from previous day: 09/04 0701 - 09/05 0700 In: 1785 [P.O.:360; I.V.:1425] Out: 2025 [Urine:1900; Blood:125]  Physical Examination: General: alert, cooperative, and no distress Resp: clear to auscultation bilaterally Cardio: regular rate and rhythm, S1, S2 normal, no murmur, click, rub or gallop GI: soft, non-tender; bowel sounds normal; no masses,  no organomegaly and incision: lap sites to the abdomen are intact with dermabond without active drainage Extremities: left pedal edema noted, no edema above this point in the LLE, no edema in RLE Foley in place with clear, yellow urine. Abdomen is tympanic in upper abdomen.  Labs: WBC/Hgb/Hct/Plts:  10.7/10.6/35.7/292 (09/05 9587) BUN/Cr/glu/ALT/AST/amyl/lip:  12/1.27/--/--/--/--/-- (09/05 9587)  Assessment: 78 y.o. s/p Procedure(s): HYSTERECTOMY,  TOTAL, ROBOT-ASSISTED, LAPAROSCOPIC, WITH BILATERAL SALPINGO-OOPHORECTOMY INJECTION, FOR SENTINEL LYMPH NODE IDENTIFICATION LYMPH NODE BIOPSY LYSIS OF ADHESIONS LYSIS, ADHESIONS, ROBOT-ASSISTED, LAPAROSCOPIC: stable Pain:  Pain is well-controlled on PRN medications.  Heme: Hgb 10.6 and Hct 35.7 this am. Appropriate given preop values and surgical losses.  ID: WBC 10.7 this am. Given intra-op abx and decadron . No evidence of infection at this time.  CV: BP and HR stable. Continue to monitor with ordered vital signs.  GI:  Tolerating po: yes. Antiemetics ordered if needed.  GU: Foley in place with adequate urine. Creatinine at baseline 1.27. Plan for foley removal this am.     FEN: No critical values on am labs.   Endo: Diabetes mellitus Type II, under good control.  CBG: CBG (last 3)  Recent Labs    04/03/24 1106 04/03/24 1709 04/04/24 0734  GLUCAP 109* 124* 120*     Prophylaxis: SCDS and lovenox  ordered.   Plan: Foley removal this am Doppler to rule out DVT in LLE Saline lock IV if diet tolerated Plan for discharge later today if voiding, meeting post-op milestones   LOS: 0 days    [name] 04/04/2024, 8:09 AM

## 2024-04-04 NOTE — Discharge Summary (Signed)
 Physician Discharge Summary  Patient ID: [name] MRN: [id number] DOB/AGE: 1945-10-05 78 y.o.  Admit date: 04/03/2024 Discharge date: 04/04/2024  Admission Diagnoses: Endometrial cancer [facility])  Discharge Diagnoses:  Principal Problem:   Endometrial cancer [facility])   Discharged Condition:  The patient is in good condition and stable for discharge.    Hospital Course: On 04/03/2024, the patient underwent the following: Procedure(s): HYSTERECTOMY, TOTAL, ROBOT-ASSISTED, LAPAROSCOPIC, WITH BILATERAL SALPINGO-OOPHORECTOMY INJECTION, FOR SENTINEL LYMPH NODE IDENTIFICATION LYMPH NODE BIOPSY LYSIS OF ADHESIONS LYSIS, ADHESIONS, ROBOT-ASSISTED, LAPAROSCOPIC.   The postoperative course was uneventful.  She was discharged to home on postoperative day 1 tolerating a regular diet, ambulating, voiding since foley removal, pain controlled, passing flatus. LLE doppler performed due to intermittent, left pedal edema with results (chronic issue, doppler during this admission prelim negative).  Consults: None  Significant Diagnostic Studies: Labs  Treatments: surgery:see above  Discharge Exam (from am assessment): Blood pressure (!) 118/55, pulse 92, temperature 98.4 F (36.9 C), temperature source Oral, resp. rate 16, height 5' 6 (1.676 m), weight 274 lb (124.3 kg), SpO2 95%. General: alert, cooperative, and no distress Resp: clear to auscultation bilaterally Cardio: regular rate and rhythm, S1, S2 normal, no murmur, click, rub or gallop GI: soft, non-tender; bowel sounds normal; no masses,  no organomegaly and incision: lap sites to the abdomen are intact with dermabond without active drainage Extremities: left pedal edema noted, no edema above this point in the LLE, no edema in RLE  Disposition: Discharge disposition: 01-Home or Self Care       Discharge Instructions     Call MD for:  difficulty breathing, headache or visual disturbances   Complete by: As directed    Call MD for:   extreme fatigue   Complete by: As directed    Call MD for:  hives   Complete by: As directed    Call MD for:  persistant dizziness or light-headedness   Complete by: As directed    Call MD for:  persistant nausea and vomiting   Complete by: As directed    Call MD for:  redness, tenderness, or signs of infection (pain, swelling, redness, odor or green/yellow discharge around incision site)   Complete by: As directed    Call MD for:  severe uncontrolled pain   Complete by: As directed    Call MD for:  temperature >100.4   Complete by: As directed    Diet - low sodium heart healthy   Complete by: As directed    Discharge wound care:   Complete by: As directed    Keep incisions clean and dry. Can take a shower and pat dry.   Driving Restrictions   Complete by: As directed    No driving for 4-89 days until the following have been met.  Do not take narcotics and drive. You need to make sure your reaction time has returned.   Increase activity slowly   Complete by: As directed    Lifting restrictions   Complete by: As directed    No lifting greater than 10 lbs, pushing, pulling, straining for 6 weeks.   Sexual Activity Restrictions   Complete by: As directed    No sexual activity, nothing in the vagina, for 12 weeks.      Allergies as of 04/04/2024       Reactions   Lyrica  [pregabalin ]    Dizziness   Cymbalta [duloxetine Hcl] Other (See Comments)   Stomach pain   Gabapentin  Itching   Lisinopril  Swelling  Maxzide [hydrochlorothiazide -triamterene] Other (See Comments)   Cramping   Niacin  Itching   Pravastatin Sodium Other (See Comments)   Headache   Simvastatin  Other (See Comments)   Memory loss   Lodine [etodolac] Itching        Medication List     TAKE these medications    acetaminophen  500 MG tablet Commonly known as: TYLENOL  Take 1,000 mg by mouth daily as needed for headache, mild pain (pain score 1-3) or moderate pain (pain score 4-6).   aspirin  EC 81 MG  tablet Take 81 mg by mouth daily. Swallow whole.   atorvastatin  20 MG tablet Commonly known as: LIPITOR TAKE 1 TABLET(20 MG) BY MOUTH AT BEDTIME   clotrimazole-betamethasone  cream Commonly known as: LOTRISONE Apply 1 Application topically 2 (two) times daily.   escitalopram  20 MG tablet Commonly known as: LEXAPRO  Take 20 mg by mouth daily.   ezetimibe  10 MG tablet Commonly known as: ZETIA  Take 10 mg by mouth at bedtime.   hydrALAZINE 25 MG tablet Commonly known as: APRESOLINE Take 25 mg by mouth daily.   hydrochlorothiazide  25 MG tablet Commonly known as: HYDRODIURIL  Take 1 tablet (25 mg total) by mouth daily.   HYDROcodone -acetaminophen  5-325 MG tablet Commonly known as: NORCO/VICODIN Take 1 tablet by mouth every 6 (six) hours as needed for severe pain (pain score 7-10). For AFTER surgery only, do not take and drive. Monitor tylenol  intake. What changed:  how much to take reasons to take this additional instructions   hydrOXYzine 25 MG tablet Commonly known as: ATARAX Take 25 mg by mouth in the morning and at bedtime.   irbesartan  300 MG tablet Commonly known as: AVAPRO  Take 1 tablet (300 mg total) by mouth at bedtime. What changed: when to take this   metFORMIN  500 MG tablet Commonly known as: GLUCOPHAGE  Take 1 tablet (500 mg total) by mouth daily with breakfast.   nystatin powder Apply 1 Application topically 2 (two) times daily.   REFRESH DRY EYE THERAPY OP Place 1 drop into both eyes in the morning and at bedtime.   verapamil  240 MG 24 hr capsule Commonly known as: VERELAN  Take 240 mg by mouth at bedtime.   Vitamin D -3 25 MCG (1000 UT) Caps Take 1 capsule by mouth daily.               Discharge Care Instructions  (From admission, onward)           Start     Ordered   04/04/24 0000  Discharge wound care:       Comments: Keep incisions clean and dry. Can take a shower and pat dry.   04/04/24 1215            Follow-up Information      [name], MD Follow up on 04/10/2024.   Specialty: Gynecologic Oncology Why: On 04/10/2024, plan on having a phone visit with Dr. Viktoria to check in and discuss pathology. IN PERSON visit will be on 05/01/24 at 3:30pm at the [facility] for post-op check. Contact information: [address] [place] [address] [phone]                 Greater than thirty minutes were spend for face to face discharge instructions and discharge orders/summary in EPIC.   Signed: Eleanor JONETTA Epps 04/04/2024, 12:47 PM

## 2024-04-07 ENCOUNTER — Telehealth: Payer: Self-pay | Admitting: *Deleted

## 2024-04-07 ENCOUNTER — Ambulatory Visit: Payer: Self-pay | Admitting: Gynecologic Oncology

## 2024-04-07 NOTE — Telephone Encounter (Signed)
 Spoke with [name] this morning. She states she is eating, drinking and urinating well. She has  had a BM and is passing gas. She is taking senokot as prescribed and encouraged her to drink plenty of water . She denies fever or chills. Incisions are dry and intact. She rates her pain 5/10. Her pain is controlled with tylenol .    Instructed to call office with any fever, chills, purulent drainage, uncontrolled pain or any other questions or concerns. Patient verbalizes understanding.   Pt aware of post op appointments as well as the office number [phone] and after hours number [phone] to call if she has any questions or concerns

## 2024-04-08 ENCOUNTER — Encounter: Payer: Self-pay | Admitting: Oncology

## 2024-04-08 NOTE — Progress Notes (Signed)
 Requested MSI and MLH1 promoter hypermethylation on accession [id number] with [facility].

## 2024-04-10 ENCOUNTER — Encounter: Payer: Self-pay | Admitting: Oncology

## 2024-04-10 ENCOUNTER — Inpatient Hospital Stay: Attending: Gynecologic Oncology | Admitting: Gynecologic Oncology

## 2024-04-10 ENCOUNTER — Encounter: Payer: Self-pay | Admitting: Gynecologic Oncology

## 2024-04-10 DIAGNOSIS — D631 Anemia in chronic kidney disease: Secondary | ICD-10-CM | POA: Insufficient documentation

## 2024-04-10 DIAGNOSIS — Z923 Personal history of irradiation: Secondary | ICD-10-CM | POA: Insufficient documentation

## 2024-04-10 DIAGNOSIS — Z7984 Long term (current) use of oral hypoglycemic drugs: Secondary | ICD-10-CM | POA: Insufficient documentation

## 2024-04-10 DIAGNOSIS — E66813 Obesity, class 3: Secondary | ICD-10-CM | POA: Insufficient documentation

## 2024-04-10 DIAGNOSIS — F32A Depression, unspecified: Secondary | ICD-10-CM | POA: Insufficient documentation

## 2024-04-10 DIAGNOSIS — E1122 Type 2 diabetes mellitus with diabetic chronic kidney disease: Secondary | ICD-10-CM | POA: Insufficient documentation

## 2024-04-10 DIAGNOSIS — D573 Sickle-cell trait: Secondary | ICD-10-CM | POA: Insufficient documentation

## 2024-04-10 DIAGNOSIS — Z90710 Acquired absence of both cervix and uterus: Secondary | ICD-10-CM | POA: Insufficient documentation

## 2024-04-10 DIAGNOSIS — N183 Chronic kidney disease, stage 3 unspecified: Secondary | ICD-10-CM | POA: Insufficient documentation

## 2024-04-10 DIAGNOSIS — I129 Hypertensive chronic kidney disease with stage 1 through stage 4 chronic kidney disease, or unspecified chronic kidney disease: Secondary | ICD-10-CM | POA: Insufficient documentation

## 2024-04-10 DIAGNOSIS — C541 Malignant neoplasm of endometrium: Secondary | ICD-10-CM

## 2024-04-10 DIAGNOSIS — F419 Anxiety disorder, unspecified: Secondary | ICD-10-CM | POA: Insufficient documentation

## 2024-04-10 DIAGNOSIS — Z79899 Other long term (current) drug therapy: Secondary | ICD-10-CM | POA: Insufficient documentation

## 2024-04-10 DIAGNOSIS — Z90722 Acquired absence of ovaries, bilateral: Secondary | ICD-10-CM | POA: Insufficient documentation

## 2024-04-10 NOTE — Progress Notes (Signed)
 Gynecologic Oncology Telehealth Note: Gyn-Onc  I connected with [name] on 04/10/24 at  6:00 PM EDT by telephone and verified that I am speaking with the correct person using two identifiers.  I discussed the limitations, risks, security and privacy concerns of performing an evaluation and management service by telemedicine and the availability of in-person appointments. I also discussed with the patient that there may be a patient responsible charge related to this service. The patient expressed understanding and agreed to proceed.  Other persons participating in the visit and their role in the encounter: none.  Patient's location: home, [place] Provider's location: [facility],   Reason for Visit: follow-up  Treatment History: Incidental finding on CT scan in February showed thickened endometrium.  Her scan also showed cholelithiasis, diverticulosis, and nonobstructing right lower pole kidney stone.  At that time, patient denied any postmenopausal bleeding.  She was having some right lower quadrant pain radiating down to her hip. Pelvic ultrasound on 09/26/2023 showed uterus measuring 9.7 x 5.5 x 6.3 with an endometrial lining thickened at 33.4 mm.  Adnexa not identified but no cystic or solid masses noted. Hysteroscopy with endometrial sampling was performed on 7/31 with findings of blood and clots within the endometrial cavity limiting visualization.  Copious seedlike endometrium and clot were removed with sharp curettage. Final pathology from biopsy showed FIGO grade 2 endometrioid endometrial adenocarcinoma.  04/03/24: Lsc lysis of adhesions (Stechschulte); Robotic-assisted laparoscopic total hysterectomy with bilateral salpingo-oophorectomy, SLN injection and biopsy, lysis of adhesions, resection of left adnexal mass, cystoscopy   Interval History: Doing well. Some soreness still, especially with moving.  Pain was feeling on right side before surgery is gone. Mirilax helping with  constipation.  Denies urinary symptoms. Denies vaginal bleeding.  Past Medical/Surgical History: Past Medical History:  Diagnosis Date   Anemia    Anxiety    Cancer (HCC)    breast treated with radiation  endometrial   Chronic fatigue syndrome    Chronic low back pain    followed by dr jinny. mavis   CKD (chronic kidney disease), stage III (HCC)    followed by pcp   Constipation 02/20/2024   02-21-2024  pt stated viist w/ PCP due to constipation given stool softner and abdominal xray done   Coronary artery calcification 05/2022   cardiologist--- dr s. michele;   CT 11/ 2023 ;  CCTA 09-04-2022  calcium  score=36.9 involving LAD w/ mild nonob dz;  NUC 08-14-2022  LR w/ mild perfusion defect reversible, nuclear ef 60%   DDD (degenerative disc disease), lumbar    Depression    Diverticulosis of colon    DOE (dyspnea on exertion)    02-21-2024  sob w/ stairs/ long distance walking/  okay with household chores ,  per cardiologist note 09/2023  chronic DOE stable   Edema of both lower extremities    02-21-2024 pt stated left > right   Flat foot, acquired    GERD (gastroesophageal reflux disease)    History of adenomatous polyp of colon    GI-- dr shila   History of gout    History of kidney stones    History of left breast cancer 2013   oncologist--- dr layla (lov 09-06-2017 released w/ no recurrence) ;  08-30-2011  s/p left lumpectomy w/ node dissection;  Stage IAG1, IDC, ER/PR+, nodes negative;  completed IMRT 11-24-2011 ,  completed tamoxifen  02/ 2019   Hyperlipidemia    Hypertension    Lumbar stenosis with neurogenic claudication    per  pt left leg   OA (osteoarthritis)    OSA on CPAP 07/2004   followed by [facility]--- dr jude;   sleep study in epic 08-05-2004 severe osa, (02-21-2024 pt stated uses cpap nightly)   Peripheral neuropathy    Personal history of radiation therapy 09/2011   left breast/ chest wall   10-29-2011  to  11-24-2011  6100 cGy   Pneumonia    Rash     02-21-2024  pt stated has rash under abd fold and between thighs   Sickle cell trait (HCC)    Thickened endometrium    Tinnitus of both ears    Type 2 diabetes mellitus (HCC)    followed by pcp:   (02-21-2024  pt stated checks  blood sugar 2 times montly , not fasting)   Wears partial dentures    upper    Past Surgical History:  Procedure Laterality Date   APPENDECTOMY  1978   BIOPSY  10/06/2021   Procedure: BIOPSY;  Surgeon: Shila Gustav GAILS, MD;  Location: [facility];  Service: Endoscopy;;   BLEPHAROPLASTY Right    age 40s---  right upper eyelid   BREAST LUMPECTOMY WITH NEEDLE LOCALIZATION Right 02/06/2013   Procedure: RIGHT BREAST LUMPECTOMY WITH NEEDLE LOCALIZATION;  Surgeon: Vicenta DELENA Poli, MD;  Location: [facility];  Service: General;  Laterality: Right;   BREAST LUMPECTOMY WITH NEEDLE LOCALIZATION AND AXILLARY SENTINEL LYMPH NODE BX Left 08/30/2011   @[facility]  by dr d. poli;   CATARACT EXTRACTION W/ INTRAOCULAR LENS IMPLANT Bilateral 2018   COLONOSCOPY WITH PROPOFOL  N/A 10/27/2015   Procedure: COLONOSCOPY WITH PROPOFOL ;  Surgeon: Gustav Shila GAILS, MD;  Location: [facility];  Service: Endoscopy;  Laterality: N/A;   COLONOSCOPY WITH PROPOFOL  N/A 10/06/2021   Procedure: COLONOSCOPY WITH PROPOFOL ;  Surgeon: Shila Gustav GAILS, MD;  Location: [facility];  Service: Endoscopy;  Laterality: N/A;   DILATATION & CURRETTAGE/HYSTEROSCOPY WITH RESECTOCOPE N/A 02/28/2024   Procedure: DILATATION & CURETTAGE/HYSTEROSCOPY;  Surgeon: Okey Leader, MD;  Location: [facility] OR;  Service: Gynecology;  Laterality: N/A;   HARDWARE REMOVAL Left 04/20/2015   Procedure: HARDWARE REMOVAL LEFT LUMBAR FIVE SCREW;  Surgeon: Darina Boehringer, MD;  Location: [facility] OR;  Service: Neurosurgery;  Laterality: Left;   HYSTEROSCOPY WITH D & C  10/04/2005   @[facility]  by dr a. okey;  polypectomy   INCISIONAL HERNIA REPAIR N/A 04/03/2024   Procedure: LYSIS OF ADHESIONS;  Surgeon: Lyndel Deward PARAS, MD;  Location: [facility];   Service: General;  Laterality: N/A;   INJECTION, FOR SENTINEL LYMPH NODE IDENTIFICATION N/A 04/03/2024   Procedure: INJECTION, FOR SENTINEL LYMPH NODE IDENTIFICATION;  Surgeon: [name], MD;  Location: [facility];  Service: Gynecology;  Laterality: N/A;   LYMPH NODE BIOPSY N/A 04/03/2024   Procedure: LYMPH NODE BIOPSY;  Surgeon: [name], MD;  Location: [facility];  Service: Gynecology;  Laterality: N/A;   POLYPECTOMY  10/06/2021   Procedure: POLYPECTOMY;  Surgeon: Shila Gustav GAILS, MD;  Location: [facility];  Service: Endoscopy;;   POSTERIOR FUSION LUMBAR SPINE  04/19/2020   @[facility]  by dr r. kritzer:  Laminectomy / Decompression  L2--3 /  interbody fusion w/ segmental instrumentation L2--L5   POSTERIOR LUMBAR FUSION 2 LEVEL  12/29/2014   @[facility]  by dr saunders. kritzer;   L3--4  and L4--5   ROBOTIC ASSISTED LAPAROSCOPIC LYSIS OF ADHESION  04/03/2024   Procedure: LYSIS, ADHESIONS, ROBOT-ASSISTED, LAPAROSCOPIC;  Surgeon: [name], MD;  Location: [facility];  Service: Gynecology;;   ROBOTIC ASSISTED TOTAL HYSTERECTOMY  WITH BILATERAL SALPINGO OOPHERECTOMY Bilateral 04/03/2024   Procedure: HYSTERECTOMY, TOTAL, ROBOT-ASSISTED, LAPAROSCOPIC, WITH BILATERAL SALPINGO-OOPHORECTOMY;  Surgeon: [name], MD;  Location: [facility];  Service: Gynecology;  Laterality: Bilateral;  WITH POSSIBLE LAPAROTOMY   THYROID  CYST EXCISION  1994   benign   TOE SURGERY Left    TOTAL KNEE ARTHROPLASTY Left 08/19/2004   @[facility]  by dr r. gioffre   TOTAL KNEE ARTHROPLASTY Right 05/24/2005   @[facility]  by dr r. gioffre   TRANSPHENOIDAL PITUITARY RESECTION  01/25/2010   @[facility]  by dr r. kritzer/ dr c. 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    Family History  Problem Relation Age of Onset   Stroke Mother    Diabetes Mother    Hypertension Mother    Hyperlipidemia Mother    Obesity Mother    Heart disease Father    Clotting disorder Father    Stroke Father    Hypertension Father     Heart Problems Father    Breast cancer Sister 55   Colon cancer Neg Hx    Neuropathy Neg Hx     Social History   Socioeconomic History   Marital status: Married    Spouse name: Alm   Number of children: 2   Years of education: 12   Highest education level: Not on file  Occupational History   Occupation: Retired Network engineer  Tobacco Use   Smoking status: Never   Smokeless tobacco: Never  Vaping Use   Vaping status: Never Used  Substance and Sexual Activity   Alcohol use: No   Drug use: Never   Sexual activity: Not Currently    Partners: Male    Birth control/protection: Post-menopausal  Other Topics Concern   Not on file  Social History Narrative   Lives with husband   Caffeine use: 2-3 servings per day   Social Drivers of Health   Financial Resource Strain: Not on file  Food Insecurity: No Food Insecurity (04/03/2024)   Hunger Vital Sign    Worried About Running Out of Food in the Last Year: Never true    Ran Out of Food in the Last Year: Never true  Transportation Needs: No Transportation Needs (04/03/2024)   PRAPARE - Administrator, Civil Service (Medical): No    Lack of Transportation (Non-Medical): No  Physical Activity: Not on file  Stress: Not on file  Social Connections: Patient Declined (04/03/2024)   Social Connection and Isolation Panel    Frequency of Communication with Friends and Family: Patient declined    Frequency of Social Gatherings with Friends and Family: Patient declined    Attends Religious Services: Patient declined    Database administrator or Organizations: Patient declined    Attends Banker Meetings: Patient declined    Marital Status: Patient declined    Current Medications:  Current Outpatient Medications:    acetaminophen  (TYLENOL ) 500 MG tablet, Take 1,000 mg by mouth daily as needed for headache, mild pain (pain score 1-3) or moderate pain (pain score 4-6)., Disp: , Rfl:    aspirin  EC 81 MG tablet,  Take 81 mg by mouth daily. Swallow whole., Disp: , Rfl:    atorvastatin  (LIPITOR) 20 MG tablet, TAKE 1 TABLET(20 MG) BY MOUTH AT BEDTIME, Disp: 90 tablet, Rfl: 3   Cholecalciferol  (VITAMIN D -3) 25 MCG (1000 UT) CAPS, Take 1 capsule by mouth daily., Disp: , Rfl:    clotrimazole-betamethasone  (LOTRISONE) cream, Apply 1 Application topically 2 (two) times  daily., Disp: , Rfl:    escitalopram  (LEXAPRO ) 20 MG tablet, Take 20 mg by mouth daily., Disp: , Rfl:    ezetimibe  (ZETIA ) 10 MG tablet, Take 10 mg by mouth at bedtime., Disp: , Rfl:    Glycerin-Polysorbate 80 (REFRESH DRY EYE THERAPY OP), Place 1 drop into both eyes in the morning and at bedtime., Disp: , Rfl:    hydrALAZINE (APRESOLINE) 25 MG tablet, Take 25 mg by mouth daily. (Patient not taking: Reported on 03/28/2024), Disp: , Rfl:    hydrochlorothiazide  (HYDRODIURIL ) 25 MG tablet, Take 1 tablet (25 mg total) by mouth daily. (Patient not taking: Reported on 03/28/2024), Disp: 30 tablet, Rfl: 0   HYDROcodone -acetaminophen  (NORCO/VICODIN) 5-325 MG tablet, Take 1 tablet by mouth every 6 (six) hours as needed for severe pain (pain score 7-10). For AFTER surgery only, do not take and drive. Monitor tylenol  intake., Disp: 15 tablet, Rfl: 0   hydrOXYzine (ATARAX) 25 MG tablet, Take 25 mg by mouth in the morning and at bedtime., Disp: , Rfl:    irbesartan  (AVAPRO ) 300 MG tablet, Take 1 tablet (300 mg total) by mouth at bedtime. (Patient taking differently: Take 300 mg by mouth daily.), Disp: 30 tablet, Rfl: 0   metFORMIN  (GLUCOPHAGE ) 500 MG tablet, Take 1 tablet (500 mg total) by mouth daily with breakfast., Disp: 90 tablet, Rfl: 0   nystatin powder, Apply 1 Application topically 2 (two) times daily., Disp: , Rfl:    verapamil  (VERELAN  PM) 240 MG 24 hr capsule, Take 240 mg by mouth at bedtime., Disp: , Rfl:   Review of Symptoms: Pertinent positives as per HPI.  Physical Exam: Deferred given limitations of phone visit.  Laboratory & Radiologic  Studies: A. SENTINEL LYMPH NODE, LEFT EXTERNAL ILIAC, RESECTION: -  1 benign fibrotic lymph node with dystrophic calcifications, negative for malignancy (0/1).  B. ADNEXAL MASS, LEFT, RESECTION: -  Metastatic endometrioid carcinoma with squamous metaplasia and areas of geographic necrosis. -  No definitive residual normal ovary is identified. -  Tumor is present on cauterized cut surface.  C. UTERUS, CERVIX, FALLOPIAN TUBE, OVARY, BILATERAL, HYSTERECTOMY: -  Endometrioid carcinoma with mucinous features, FIGO grade 2 of 3 involving 65% of the myometrium. -  Unremarkable cervix and lower uterine segment. -  Left fallopian tube and ovary with focal surface involvement by endometrioid carcinoma. -  Unremarkable right fallopian tube and ovary.  D. SENTINEL LYMPH NODE, RIGHT OBTURATOR, RESECTION: -  1 benign fibrotic lymph node with dystrophic calcifications, negative for malignancy (0/1).  ONCOLOGY TABLE:  UTERUS, CARCINOMA OR CARCINOSARCOMA: Resection  Procedure: Robot-assisted total hysterectomy with bilateral salpingo-oophorectomy and sentinel lymph nodes. Histologic Type: Endometrioid with mucinous features (refer to diagnostic biopsy ([facility] [phone]) Histologic Grade: FIGO grade 2 Myometrial Invasion:      Depth of Myometrial Invasion (mm): 13      Myometrial Thickness (mm): 20      Percentage of Myometrial Invasion: 65% Uterine Serosa Involvement: Not identified Cervical stromal Involvement: Not identified Extent of involvement of other tissue/organs: Left adnexal mass (part B) with tumor identified on serosal surface of left fallopian tube and ovary (blocks C21 and C26) Peritoneal/Ascitic Fluid: Not received Lymphovascular Invasion: Not identified Regional Lymph Nodes:      Pelvic Lymph Nodes Examined:          2 Sentinel          0 non-sentinel          2 total      Pelvic Lymph Nodes with Metastasis:  0          Macrometastasis: (>2.0 mm): N/A           Micrometastasis: (>0.2 mm and < 2.0 mm): N/A          Isolated Tumor Cells (<0.2 mm): N/A          Laterality of Lymph Node with Tumor: N/A          Extracapsular Extension: N/A      Para-aortic Lymph Nodes Examined:          0 Sentinel          0 non-sentinel          0 total      Para-aortic Lymph Nodes with Metastasis: N/A          Macrometastasis: (>2.0 mm): N/A          Micrometastasis:  (>0.2 mm and < 2.0 mm): N/A          Isolated Tumor Cells (<0.2 mm): N/A          Laterality of Lymph Node with Tumor: N/A          Extracapsular Extension: N/A Distant Metastasis:      Distant Site(s) Involved: N/A Pathologic Stage Classification (pTNM, AJCC 8th Edition): pT3a, pN0 Ancillary Studies: MMR/MSI testing as well as p53 and p16 will be ordered reported in an addendum Representative Tumor Block: C9   TEST           RESULT  MLH1:          LOSS OF NUCLEAR EXPRESSION  MSH2:          Preserved nuclear expression  MSH6:          Preserved nuclear expression  PMS2:          LOSS OF NUCLEAR EXPRESSION   Assessment & Plan: [name] is a 78 y.o. woman with Stage IIIA1 grade 2 endometrioid endometrial adenocarcinoma who presents for phone follow-up. MMRd, p53 WT.  Patient is overall doing well, meeting postoperative milestones.  Discussed continued expectations and restrictions.  Reviewed pathology with her from surgery.  Unfortunately, mass within the left adnexa was metastatic disease.  Discussed recommendation for adjuvant therapy with referral to both medical oncology and radiation oncology.  Patient amenable.  I have also asked my clinic to reach out to pathology to add MSI, MLH1 promoter hyper methylation testing, and HER2.  Discussed thought process for why large hernia was not repaired at the time of surgery (increased risk of infection with complex repair with mesh at time of hysterectomy, hernia large enough that there is virtually no risk of bowel incarceration or  strangulation).  I discussed the assessment and treatment plan with the patient. The patient was provided with an opportunity to ask questions and all were answered. The patient agreed with the plan and demonstrated an understanding of the instructions.   The patient was advised to call back or see an in-person evaluation if the symptoms worsen or if the condition fails to improve as anticipated.   12 minutes of total time was spent for this patient encounter, including preparation, phone counseling with the patient and coordination of care, and documentation of the encounter.   Comer Dollar, MD  Division of Gynecologic Oncology  Department of Obstetrics and Gynecology  [facility]

## 2024-04-10 NOTE — Progress Notes (Signed)
Referral placed for radiation oncology per Dr. Pricilla Holm.

## 2024-04-10 NOTE — Progress Notes (Signed)
 Requested p53 and Her2 testing on accession [id number] with [facility] per Dr. Viktoria.

## 2024-04-11 ENCOUNTER — Telehealth: Payer: Self-pay | Admitting: Oncology

## 2024-04-11 ENCOUNTER — Encounter: Payer: Self-pay | Admitting: Hematology and Oncology

## 2024-04-11 LAB — SURGICAL PATHOLOGY

## 2024-04-11 NOTE — Telephone Encounter (Signed)
 [name] and scheduled a new patient appointment with Dr. Lonn on 04/15/24 at 2:00 with 1:30 arrival to the cancer center to check in.  She verbalized understanding and agreement.

## 2024-04-14 NOTE — Assessment & Plan Note (Signed)
 The patient was diagnosed with endometrial cancer after an incidental finding noted on CT imaging She underwent complete hysterectomy, bilateral salpingo-oophorectomy, sentinel lymph node injection and biopsy and lysis of adhesions Final pathology: Mixed morphology, areas of endometrioid with some areas of mucinous features, MMR deficient, p53 wild-type, HER2/neu 0%  We discussed the role of adjuvant treatment We reviewed the NCCN guidelines We discussed the role of chemotherapy. The combination treatment was approved based on the publication as follows:  Pembrolizumab plus Chemotherapy in Advanced Endometrial Cancer This article was published on October 24, 2021, at [email or web address]. LOISE Diedra PARAS Med [phone]. DOI: 10.1056/[id number]  BACKGROUND  Standard first-line chemotherapy for endometrial cancer is paclitaxel plus carboplatin. The benefit of adding pembrolizumab to chemotherapy remains unclear.   METHODS  In this double-blind, placebo-controlled, randomized, phase 3 trial, we assigned 816 patients with measurable disease (stage III or IVA) or stage IVB or recurrent endometrial cancer in a 1:1 ratio to receive pembrolizumab or placebo along with combination therapy with paclitaxel plus carboplatin. The administration of pembrolizumab or placebo was planned in 6 cycles every 3 weeks, followed by up to 14 maintenance cycles every 6 weeks. The patients were stratified into two cohorts according to whether they had mismatch repair-deficient (dMMR) or mismatch repair-proficient (pMMR) disease. Previous adjuvant chemotherapy was permitted if the treatment-free interval was at least 12 months. The primary outcome was progression-free survival in the two cohorts. Interim analyses were scheduled to be triggered after the occurrence of at least 84 events of death or progression in the dMMR cohort and at least 196 events in the pMMR cohort.   RESULTS  In the 107-month analysis, Kaplan-Meier estimates of  progression-free survival in the dMMR cohort were 74% in the pembrolizumab group and 38% in the placebo group (hazard ratio for progression or death, 0.30; 95% confidence interval [CI], 0.19 to 0.48; P<0.001), a 70% difference in relative risk. In the pMMR cohort, median progression-free survival was 13.1 months with pembrolizumab and 8.7 months with placebo (hazard ratio, 0.54; 95% CI, 0.41 to 0.71; P<0.001). Adverse events were as expected for pembrolizumab and combination chemotherapy.   CONCLUSIONS  In patients with advanced or recurrent endometrial cancer, the addition of pembrolizumab to standard chemotherapy resulted in significantly longer progressionfree survival than with chemotherapy alone. (Funded by the [employer] and others; [employer].gov number, WRU96085387.)  We discussed some of the risks, benefits, side-effects of carboplatin, Taxol and pembrolizumab. Treatment is intravenous, every 3 weeks x 6 cycles. After completion of chemo, she will proceed with maintenance pembrolizumab only  Some of the short term side-effects included, though not limited to, including weight loss, life threatening infections, risk of allergic reactions, abnormal thyroid  function, need for transfusions of blood products, nausea, vomiting, change in bowel habits, loss of hair, admission to hospital for various reasons, and risks of death.   Long term side-effects are also discussed including risks of infertility, permanent damage to nerve function, hearing loss, chronic fatigue, kidney damage with possibility needing hemodialysis, and rare secondary malignancy including bone marrow disorders.  The patient is aware that the response rates discussed earlier is not guaranteed.  After a long discussion, patient made an informed decision to proceed with the prescribed plan of care.   After much discussion, she is in agreement to proceed We discussed premedication dexamethasone  prior to  treatment  I recommend port placement and chemo education class with plan to start her treatment first week of October

## 2024-04-15 ENCOUNTER — Inpatient Hospital Stay: Admitting: Hematology and Oncology

## 2024-04-15 ENCOUNTER — Encounter: Payer: Self-pay | Admitting: Hematology and Oncology

## 2024-04-15 VITALS — BP 151/97 | HR 65 | Temp 98.0°F

## 2024-04-15 DIAGNOSIS — F32A Depression, unspecified: Secondary | ICD-10-CM | POA: Diagnosis not present

## 2024-04-15 DIAGNOSIS — N1831 Chronic kidney disease, stage 3a: Secondary | ICD-10-CM | POA: Diagnosis not present

## 2024-04-15 DIAGNOSIS — E66813 Obesity, class 3: Secondary | ICD-10-CM | POA: Diagnosis not present

## 2024-04-15 DIAGNOSIS — Z923 Personal history of irradiation: Secondary | ICD-10-CM | POA: Diagnosis not present

## 2024-04-15 DIAGNOSIS — D573 Sickle-cell trait: Secondary | ICD-10-CM | POA: Diagnosis not present

## 2024-04-15 DIAGNOSIS — I129 Hypertensive chronic kidney disease with stage 1 through stage 4 chronic kidney disease, or unspecified chronic kidney disease: Secondary | ICD-10-CM | POA: Diagnosis not present

## 2024-04-15 DIAGNOSIS — D631 Anemia in chronic kidney disease: Secondary | ICD-10-CM | POA: Diagnosis not present

## 2024-04-15 DIAGNOSIS — N183 Chronic kidney disease, stage 3 unspecified: Secondary | ICD-10-CM | POA: Diagnosis not present

## 2024-04-15 DIAGNOSIS — C541 Malignant neoplasm of endometrium: Secondary | ICD-10-CM | POA: Diagnosis present

## 2024-04-15 DIAGNOSIS — E1122 Type 2 diabetes mellitus with diabetic chronic kidney disease: Secondary | ICD-10-CM | POA: Diagnosis not present

## 2024-04-15 DIAGNOSIS — Z90710 Acquired absence of both cervix and uterus: Secondary | ICD-10-CM | POA: Diagnosis not present

## 2024-04-15 DIAGNOSIS — F419 Anxiety disorder, unspecified: Secondary | ICD-10-CM | POA: Diagnosis not present

## 2024-04-15 DIAGNOSIS — D539 Nutritional anemia, unspecified: Secondary | ICD-10-CM | POA: Diagnosis not present

## 2024-04-15 DIAGNOSIS — M48062 Spinal stenosis, lumbar region with neurogenic claudication: Secondary | ICD-10-CM

## 2024-04-15 DIAGNOSIS — Z79899 Other long term (current) drug therapy: Secondary | ICD-10-CM | POA: Diagnosis not present

## 2024-04-15 DIAGNOSIS — Z7984 Long term (current) use of oral hypoglycemic drugs: Secondary | ICD-10-CM | POA: Diagnosis not present

## 2024-04-15 DIAGNOSIS — Z90722 Acquired absence of ovaries, bilateral: Secondary | ICD-10-CM | POA: Diagnosis not present

## 2024-04-15 MED ORDER — PROCHLORPERAZINE MALEATE 10 MG PO TABS: 10.0000 mg | ORAL_TABLET | Freq: Four times a day (QID) | ORAL | 1 refills | Status: AC | PRN

## 2024-04-15 MED ORDER — ONDANSETRON HCL 8 MG PO TABS
8.0000 mg | ORAL_TABLET | Freq: Three times a day (TID) | ORAL | 1 refills | Status: AC | PRN
Start: 2024-04-15 — End: ?

## 2024-04-15 MED ORDER — DEXAMETHASONE 4 MG PO TABS: ORAL_TABLET | ORAL | 6 refills | Status: DC

## 2024-04-15 MED ORDER — LIDOCAINE-PRILOCAINE 2.5-2.5 % EX CREA: TOPICAL_CREAM | CUTANEOUS | 3 refills | Status: AC

## 2024-04-15 NOTE — Assessment & Plan Note (Signed)
 She has severe neuropathic pain from pre-existing spinal stenosis and possibly diabetic neuropathy At baseline, her neuropathic pain is rated as severe with burning sensation Plan to reduce the dose of paclitaxel to 80 mg/m

## 2024-04-15 NOTE — Assessment & Plan Note (Signed)
 She has signs of anemia, multifactorial, could be due to recent surgery and anemia chronic kidney disease I will order a vitamin B12 level and iron studies in the next visit

## 2024-04-15 NOTE — Assessment & Plan Note (Signed)
 I will adjust the dose of chemotherapy according to her creatinine

## 2024-04-15 NOTE — Assessment & Plan Note (Signed)
 She has significant class III obesity The calculated dose of chemotherapy comes back really high At her age, I recommend using adjusted body weight for her chemotherapy dosing

## 2024-04-15 NOTE — Progress Notes (Signed)
 GYN Location of Tumor / Histology:    [name] presented with symptoms of: {symptoms; gyn cyclic:13153::bleeding}  Biopsies revealed:    Past/Anticipated interventions by Gyn/Onc surgery, if any:    Past/Anticipated interventions by medical oncology, if any:   Weight changes, if any: {:18581}  Bowel/Bladder complaints, if any: {yes no:314532}, {Blank single:19197::diarrhea,constipation,urinary frequency,burning,trouble emptying bladder, }  Nausea/Vomiting, if any: {:18581}  Pain issues, if any:  {:18581}  SAFETY ISSUES: Prior radiation? {:18581} Pacemaker/ICD? {:18581} Possible current pregnancy? {:18581} Is the patient on methotrexate? {:18581}  Current Complaints / other details:  ***

## 2024-04-15 NOTE — Progress Notes (Signed)
 [facility] CONSULT NOTE  Patient Care Team: [name], MD as PCP - General (Family Medicine) Michele Richardson, DO as PCP - Cardiology (Cardiology) Okey Arch, MD (Inactive) as Referring Physician (Obstetrics and Gynecology)  ASSESSMENT & PLAN:  Endometrial cancer [facility]) The patient was diagnosed with endometrial cancer after an incidental finding noted on CT imaging She underwent complete hysterectomy, bilateral salpingo-oophorectomy, sentinel lymph node injection and biopsy and lysis of adhesions Final pathology: Mixed morphology, areas of endometrioid with some areas of mucinous features, MMR deficient, p53 wild-type, HER2/neu 0%  We discussed the role of adjuvant treatment We reviewed the NCCN guidelines We discussed the role of chemotherapy. The combination treatment was approved based on the publication as follows:  Pembrolizumab plus Chemotherapy in Advanced Endometrial Cancer This article was published on October 24, 2021, at [email or web address]. LOISE Diedra PARAS Med [phone]. DOI: 10.1056/[email or web address]  BACKGROUND  Standard first-line chemotherapy for endometrial cancer is paclitaxel plus carboplatin. The benefit of adding pembrolizumab to chemotherapy remains unclear.   METHODS  In this double-blind, placebo-controlled, randomized, phase 3 trial, we assigned 816 patients with measurable disease (stage III or IVA) or stage IVB or recurrent endometrial cancer in a 1:1 ratio to receive pembrolizumab or placebo along with combination therapy with paclitaxel plus carboplatin. The administration of pembrolizumab or placebo was planned in 6 cycles every 3 weeks, followed by up to 14 maintenance cycles every 6 weeks. The patients were stratified into two cohorts according to whether they had mismatch repair-deficient (dMMR) or mismatch repair-proficient (pMMR) disease. Previous adjuvant chemotherapy was permitted if the treatment-free interval was at least 12 months. The primary  outcome was progression-free survival in the two cohorts. Interim analyses were scheduled to be triggered after the occurrence of at least 84 events of death or progression in the dMMR cohort and at least 196 events in the pMMR cohort.   RESULTS  In the 39-month analysis, Kaplan-Meier estimates of progression-free survival in the dMMR cohort were 74% in the pembrolizumab group and 38% in the placebo group (hazard ratio for progression or death, 0.30; 95% confidence interval [CI], 0.19 to 0.48; P<0.001), a 70% difference in relative risk. In the pMMR cohort, median progression-free survival was 13.1 months with pembrolizumab and 8.7 months with placebo (hazard ratio, 0.54; 95% CI, 0.41 to 0.71; P<0.001). Adverse events were as expected for pembrolizumab and combination chemotherapy.   CONCLUSIONS  In patients with advanced or recurrent endometrial cancer, the addition of pembrolizumab to standard chemotherapy resulted in significantly longer progressionfree survival than with chemotherapy alone. (Funded by the [employer] and others; [employer].gov number, WRU96085387.)  We discussed some of the risks, benefits, side-effects of carboplatin, Taxol and pembrolizumab. Treatment is intravenous, every 3 weeks x 6 cycles. After completion of chemo, she will proceed with maintenance pembrolizumab only  Some of the short term side-effects included, though not limited to, including weight loss, life threatening infections, risk of allergic reactions, abnormal thyroid  function, need for transfusions of blood products, nausea, vomiting, change in bowel habits, loss of hair, admission to hospital for various reasons, and risks of death.   Long term side-effects are also discussed including risks of infertility, permanent damage to nerve function, hearing loss, chronic fatigue, kidney damage with possibility needing hemodialysis, and rare secondary malignancy including bone marrow  disorders.  The patient is aware that the response rates discussed earlier is not guaranteed.  After a long discussion, patient made an informed decision to proceed with the prescribed plan of care.  After much discussion, she is in agreement to proceed We discussed premedication dexamethasone  prior to treatment  I recommend port placement and chemo education class with plan to start her treatment first week of October  Deficiency anemia She has signs of anemia, multifactorial, could be due to recent surgery and anemia chronic kidney disease I will order a vitamin B12 level and iron studies in the next visit  Spinal stenosis of lumbar region with neurogenic claudication She has severe neuropathic pain from pre-existing spinal stenosis and possibly diabetic neuropathy At baseline, her neuropathic pain is rated as severe with burning sensation Plan to reduce the dose of paclitaxel to 80 mg/m  CKD (chronic kidney disease), stage III (HCC) I will adjust the dose of chemotherapy according to her creatinine  Class 3 obesity She has significant class III obesity The calculated dose of chemotherapy comes back really high At her age, I recommend using adjusted body weight for her chemotherapy dosing  Orders Placed This Encounter  Procedures   IR IMAGING GUIDED PORT INSERTION    Standing Status:   Future    Expected Date:   04/22/2024    Expiration Date:   04/15/2025    Reason for Exam (SYMPTOM  OR DIAGNOSIS REQUIRED):   need port for chemo    Preferred Imaging Location?:   [facility]   CBC with Differential (Cancer Center Only)    Standing Status:   Future    Expected Date:   05/02/2024    Expiration Date:   05/02/2025   CMP (Cancer Center only)    Standing Status:   Future    Expected Date:   05/02/2024    Expiration Date:   05/02/2025   T4    Standing Status:   Future    Expected Date:   05/02/2024    Expiration Date:   05/02/2025   TSH    Standing Status:   Future     Expected Date:   05/02/2024    Expiration Date:   05/02/2025   CBC with Differential (Cancer Center Only)    Standing Status:   Future    Expected Date:   05/23/2024    Expiration Date:   05/23/2025   CMP (Cancer Center only)    Standing Status:   Future    Expected Date:   05/23/2024    Expiration Date:   05/23/2025   CBC with Differential (Cancer Center Only)    Standing Status:   Future    Expected Date:   06/13/2024    Expiration Date:   06/13/2025   CMP (Cancer Center only)    Standing Status:   Future    Expected Date:   06/13/2024    Expiration Date:   06/13/2025   T4    Standing Status:   Future    Expected Date:   06/13/2024    Expiration Date:   06/13/2025   TSH    Standing Status:   Future    Expected Date:   06/13/2024    Expiration Date:   06/13/2025   CBC with Differential (Cancer Center Only)    Standing Status:   Future    Expected Date:   07/04/2024    Expiration Date:   07/04/2025   CMP (Cancer Center only)    Standing Status:   Future    Expected Date:   07/04/2024    Expiration Date:   07/04/2025   CBC with Differential (Cancer Center Only)    Standing Status:  Future    Expected Date:   07/25/2024    Expiration Date:   07/25/2025   CMP (Cancer Center only)    Standing Status:   Future    Expected Date:   07/25/2024    Expiration Date:   07/25/2025   CBC with Differential (Cancer Center Only)    Standing Status:   Future    Expected Date:   08/15/2024    Expiration Date:   08/15/2025   CMP (Cancer Center only)    Standing Status:   Future    Expected Date:   08/15/2024    Expiration Date:   08/15/2025   T4    Standing Status:   Future    Expected Date:   08/15/2024    Expiration Date:   08/15/2025   TSH    Standing Status:   Future    Expected Date:   08/15/2024    Expiration Date:   08/15/2025   CBC with Differential (Cancer Center Only)    Standing Status:   Future    Expected Date:   09/05/2024    Expiration Date:   09/05/2025   CMP (Cancer Center  only)    Standing Status:   Future    Expected Date:   09/05/2024    Expiration Date:   09/05/2025   T4    Standing Status:   Future    Expected Date:   09/05/2024    Expiration Date:   09/05/2025   TSH    Standing Status:   Future    Expected Date:   09/05/2024    Expiration Date:   09/05/2025   CBC with Differential (Cancer Center Only)    Standing Status:   Future    Expected Date:   10/17/2024    Expiration Date:   10/17/2025   CMP (Cancer Center only)    Standing Status:   Future    Expected Date:   10/17/2024    Expiration Date:   10/17/2025   T4    Standing Status:   Future    Expected Date:   10/17/2024    Expiration Date:   10/17/2025   TSH    Standing Status:   Future    Expected Date:   10/17/2024    Expiration Date:   10/17/2025   CBC with Differential (Cancer Center Only)    Standing Status:   Future    Expected Date:   11/28/2024    Expiration Date:   11/28/2025   CMP (Cancer Center only)    Standing Status:   Future    Expected Date:   11/28/2024    Expiration Date:   11/28/2025   T4    Standing Status:   Future    Expected Date:   11/28/2024    Expiration Date:   11/28/2025   TSH    Standing Status:   Future    Expected Date:   11/28/2024    Expiration Date:   11/28/2025   CBC with Differential (Cancer Center Only)    Standing Status:   Future    Expected Date:   01/09/2025    Expiration Date:   01/09/2026   CMP (Cancer Center only)    Standing Status:   Future    Expected Date:   01/09/2025    Expiration Date:   01/09/2026   T4    Standing Status:   Future    Expected Date:   01/09/2025    Expiration Date:  01/09/2026   TSH    Standing Status:   Future    Expected Date:   01/09/2025    Expiration Date:   01/09/2026   CBC with Differential (Cancer Center Only)    Standing Status:   Future    Expected Date:   02/20/2025    Expiration Date:   02/20/2026   CMP (Cancer Center only)    Standing Status:   Future    Expected Date:   02/20/2025    Expiration Date:   02/20/2026   T4     Standing Status:   Future    Expected Date:   02/20/2025    Expiration Date:   02/20/2026   TSH    Standing Status:   Future    Expected Date:   02/20/2025    Expiration Date:   02/20/2026   CBC with Differential (Cancer Center Only)    Standing Status:   Future    Expected Date:   04/03/2025    Expiration Date:   04/03/2026   CMP (Cancer Center only)    Standing Status:   Future    Expected Date:   04/03/2025    Expiration Date:   04/03/2026   T4    Standing Status:   Future    Expected Date:   04/03/2025    Expiration Date:   04/03/2026   TSH    Standing Status:   Future    Expected Date:   04/03/2025    Expiration Date:   04/03/2026   Vitamin B12    Standing Status:   Future    Expected Date:   05/01/2024    Expiration Date:   04/15/2025   Ferritin    Standing Status:   Future    Expected Date:   05/01/2024    Expiration Date:   04/15/2025   Iron and Iron Binding Capacity ([facility] only)    Standing Status:   Future    Expected Date:   05/01/2024    Expiration Date:   04/15/2025    The total time spent in the appointment was 80 minutes encounter with patients including review of chart and various tests results, discussions about plan of care and coordination of care plan   All questions were answered. The patient knows to call the clinic with any problems, questions or concerns. No barriers to learning was detected.  Almarie Bedford, MD 9/16/20252:17 PM  CHIEF COMPLAINTS/PURPOSE OF CONSULTATION:  Uterine cancer, for adjuvant treatment  HISTORY OF PRESENTING ILLNESS:  [name] 78 y.o. female is here because of recent diagnosis of uterine cancer She is here by herself She lives with her husband who has dementia She has background history of breast cancer, last seen here in 2019, status post left lumpectomy, radiation treatment and antiestrogen therapy.  She completed tamoxifen  for 5 years  I have reviewed her chart and materials related to her cancer extensively and collaborated  history with the patient. Summary of oncologic history is as follows: Oncology History Overview Note  MMR deficient, p53 WT, Her2/neu 0%   Endometrial cancer (HCC)  09/26/2023 Initial Diagnosis   Incidental finding on CT scan in February showed thickened endometrium.  Her scan also showed cholelithiasis, diverticulosis, and nonobstructing right lower pole kidney stone.  At that time, patient denied any postmenopausal bleeding.  She was having some right lower quadrant pain radiating down to her hip. Pelvic ultrasound on 09/26/2023 showed uterus measuring 9.7 x 5.5 x 6.3 with an endometrial lining thickened at  33.4 mm.  Adnexa not identified but no cystic or solid masses noted. Hysteroscopy with endometrial sampling was performed on 7/31 with findings of blood and clots within the endometrial cavity limiting visualization.  Copious seedlike endometrium and clot were removed with sharp curettage. Final pathology from biopsy showed FIGO grade 2 endometrioid endometrial adenocarcinoma.     02/28/2024 Surgery   Pre-Operative Diagnosis: 1) thickened endometrium Postoperative Diagnosis: 1) thickened endometrium  Procedure: Hysteroscopy, dilation and curettage Surgeon: Dr. Marjorie Gull  Operative Findings: Cervical descensus to the level of the vaginal introitus.  Unable to adequately visualize the endometrial cavity due to the presence of blood and clots.  Copious seedlike endometrium and clot were removed with sharp curettage.  With curettage the posterior wall of the endometrial cavity had a cobblestone feeling.  Endometrial cavity was not completely evacuated of tissue.  Fluid deficit 85 cc Specimen: Endometrial curettings   02/28/2024 Pathology Results   SURGICAL PATHOLOGY  CASE: [id number]  PATIENT: DICKEY HOLT  Surgical Pathology Report      Clinical History: thickened endometrium (cm)      FINAL MICROSCOPIC DIAGNOSIS:   A. ENDOMETRIUM, CURETTAGE:  Endometrial adenocarcinoma,  endometrioid type with extensive mucinous features, FIGO grade 2 (see comment).   Comment: Sections of the endometrial curettings demonstrate an adenocarcinoma with mixed morphology, including some areas of typical endometrioid morphology, large areas with intracellular mucin, and focal areas with cytoplasmic clearing.  Immunohistochemical stains are  performed.  Vimentin shows a mixed pattern with some positive and some negative areas.  P16 and CEAm are patchy positive.  CD10 highlights stroma, and napsin is negative.  These results are most consistent with endometrioid adenocarcinoma with mucinous features.    04/03/2024 Initial Diagnosis   Endometrial cancer (HCC)   04/03/2024 Surgery   Pre-operative Diagnosis: endometrial cancer grade 2, incisional hernia   Post-operative Diagnosis: same, significant retroperitoneal adiposity, abdominal adhesions   Operation: Robotic-assisted laparoscopic total hysterectomy with bilateral salpingo-oophorectomy, SLN injection and biopsy, lysis of adhesions, resection of left adnexal mass, cystoscopy Morbid obesity requiring additional OR personnel for positioning and retraction. Obesity made retroperitoneal visualization limited and increased the complexity of the case and necessitated additional instrumentation for retraction. Obesity related complexity increased the duration of the procedure by 90 minutes.    Surgeon: Viktoria Crank MD    Operative Findings:  : On exam under anesthesia, somewhat mobile uterus.  On intra-abdominal entry, normal upper abdominal survey.  Large lower abdominal hernia noted that appears to be a defect from a transverse incision with some omentum and small bowel adherent to the anterior abdominal wall.  There are also loops of the terminal ileum and what appears to be the cecum that are tethered to the right abdominal wall.  Otherwise, normal-appearing small bowel.  There are some physiologic adhesions of the epiploica to the left  abdomen and at the level of the left pelvic brim.  The uterus itself is approximately 8-10 cm and somewhat bulbous.  Normal-appearing right fallopian tube and ovary.  Left fallopian tube and ovary normal in appearance although there is an approximately 4 cm mass that appears to be emanating from the left ovary versus in close proximity to it that is adherent to the lateral aspect of the rectal mesentery along the deep left pelvic sidewall.  It is unclear whether this is from a prior infection versus represents site of metastatic disease.  This tissue was quite friable but was ultimately able to be removed with resection of the peritoneum and very  superficial part of the lateral rectal mesentery and portion of the right pelvic sidewall peritoneum.  Significant adiposity of the retroperitoneum and deep pelvis noted.  Hemosiderin deposits on the peritoneum of the pelvis.  Mapping successful to a right obturator sentinel lymph node and left external iliac sentinel lymph node.  No enlarged lymph nodes.  No ascites. On cystoscopy, bladder dome intact, efflux seen from bilateral ureteral orifices.    04/03/2024 Pathology Results   SURGICAL PATHOLOGY   CASE: [id number]  PATIENT: DICKEY HOLT  Surgical Pathology Report   FINAL MICROSCOPIC DIAGNOSIS:   A. SENTINEL LYMPH NODE, LEFT EXTERNAL ILIAC, RESECTION:  -  1 benign fibrotic lymph node with dystrophic calcifications, negative for malignancy (0/1).   B. ADNEXAL MASS, LEFT, RESECTION:  -  Metastatic endometrioid carcinoma with squamous metaplasia and areas of geographic necrosis.  -  No definitive residual normal ovary is identified.  -  Tumor is present on cauterized cut surface.   C. UTERUS, CERVIX, FALLOPIAN TUBE, OVARY, BILATERAL, HYSTERECTOMY: -  Endometrioid carcinoma with mucinous features, FIGO grade 2 of 3 involving 65% of the myometrium. -  Unremarkable cervix and lower uterine segment. -  Left fallopian tube and ovary with focal  surface involvement by endometrioid carcinoma. -  Unremarkable right fallopian tube and ovary.  D. SENTINEL LYMPH NODE, RIGHT OBTURATOR, RESECTION: -  1 benign fibrotic lymph node with dystrophic calcifications, negative for malignancy (0/1).  ONCOLOGY TABLE:  UTERUS, CARCINOMA OR CARCINOSARCOMA: Resection  Procedure: Robot-assisted total hysterectomy with bilateral salpingo-oophorectomy and sentinel lymph nodes. Histologic Type: Endometrioid with mucinous features (refer to diagnostic biopsy ([facility] [phone]) Histologic Grade: FIGO grade 2 Myometrial Invasion:      Depth of Myometrial Invasion (mm): 13      Myometrial Thickness (mm): 20      Percentage of Myometrial Invasion: 65% Uterine Serosa Involvement: Not identified Cervical stromal Involvement: Not identified Extent of involvement of other tissue/organs: Left adnexal mass (part B) with tumor identified on serosal surface of left fallopian tube and ovary (blocks C21 and C26) Peritoneal/Ascitic Fluid: Not received Lymphovascular Invasion: Not identified Regional Lymph Nodes:      Pelvic Lymph Nodes Examined:          2 Sentinel          0 non-sentinel          2 total      Pelvic Lymph Nodes with Metastasis: 0          Macrometastasis: (>2.0 mm): N/A          Micrometastasis: (>0.2 mm and < 2.0 mm): N/A          Isolated Tumor Cells (<0.2 mm): N/A          Laterality of Lymph Node with Tumor: N/A          Extracapsular Extension: N/A      Para-aortic Lymph Nodes Examined:          0 Sentinel          0 non-sentinel          0 total      Para-aortic Lymph Nodes with Metastasis: N/A          Macrometastasis: (>2.0 mm): N/A          Micrometastasis:  (>0.2 mm and < 2.0 mm): N/A          Isolated Tumor Cells (<0.2 mm): N/A          Laterality of Lymph Node with  Tumor: N/A          Extracapsular Extension: N/A Distant Metastasis:      Distant Site(s) Involved: N/A Pathologic Stage Classification (pTNM, AJCC 8th Edition):  pT3a, pN0    04/11/2024 Cancer Staging   Staging form: Corpus Uteri - Carcinoma and Carcinosarcoma, AJCC 8th Edition and FIGO 2023 - Pathologic: Stage III (pT3, pN0, cM0) - Signed by Lonn Hicks, MD on 04/11/2024 Stage prefix: Initial diagnosis   05/02/2024 -  Chemotherapy   Patient is on Treatment Plan : UTERINE Pembrolizumab (200), Paclitaxel (80), Carboplatin (5) q21d x 6 cycles / Pembrolizumab (400) q42d       MEDICAL HISTORY:  Past Medical History:  Diagnosis Date   Anemia    Anxiety    Cancer (HCC)    breast treated with radiation  endometrial   Chronic fatigue syndrome    Chronic low back pain    followed by dr jinny. mavis   CKD (chronic kidney disease), stage III (HCC)    followed by pcp   Constipation 02/20/2024   02-21-2024  pt stated viist w/ PCP due to constipation given stool softner and abdominal xray done   Coronary artery calcification 05/2022   cardiologist--- dr s. michele;   CT 11/ 2023 ;  CCTA 09-04-2022  calcium  score=36.9 involving LAD w/ mild nonob dz;  NUC 08-14-2022  LR w/ mild perfusion defect reversible, nuclear ef 60%   DDD (degenerative disc disease), lumbar    Depression    Diverticulosis of colon    DOE (dyspnea on exertion)    02-21-2024  sob w/ stairs/ long distance walking/  okay with household chores ,  per cardiologist note 09/2023  chronic DOE stable   Edema of both lower extremities    02-21-2024 pt stated left > right   Flat foot, acquired    GERD (gastroesophageal reflux disease)    History of adenomatous polyp of colon    GI-- dr shila   History of gout    History of kidney stones    History of left breast cancer 2013   oncologist--- dr layla (lov 09-06-2017 released w/ no recurrence) ;  08-30-2011  s/p left lumpectomy w/ node dissection;  Stage IAG1, IDC, ER/PR+, nodes negative;  completed IMRT 11-24-2011 ,  completed tamoxifen  02/ 2019   Hyperlipidemia    Hypertension    Lumbar stenosis with neurogenic claudication    per pt  left leg   OA (osteoarthritis)    OSA on CPAP 07/2004   followed by [facility]--- dr jude;   sleep study in epic 08-05-2004 severe osa, (02-21-2024 pt stated uses cpap nightly)   Peripheral neuropathy    Personal history of radiation therapy 09/2011   left breast/ chest wall   10-29-2011  to  11-24-2011  6100 cGy   Pneumonia    Rash    02-21-2024  pt stated has rash under abd fold and between thighs   Sickle cell trait (HCC)    Thickened endometrium    Tinnitus of both ears    Type 2 diabetes mellitus (HCC)    followed by pcp:   (02-21-2024  pt stated checks  blood sugar 2 times montly , not fasting)   Wears partial dentures    upper    SURGICAL HISTORY: Past Surgical History:  Procedure Laterality Date   APPENDECTOMY  1978   BIOPSY  10/06/2021   Procedure: BIOPSY;  Surgeon: shila Gustav GAILS, MD;  Location: [facility];  Service: Endoscopy;;   BLEPHAROPLASTY  Right    age 11s---  right upper eyelid   BREAST LUMPECTOMY WITH NEEDLE LOCALIZATION Right 02/06/2013   Procedure: RIGHT BREAST LUMPECTOMY WITH NEEDLE LOCALIZATION;  Surgeon: Vicenta DELENA Poli, MD;  Location: [facility] OR;  Service: General;  Laterality: Right;   BREAST LUMPECTOMY WITH NEEDLE LOCALIZATION AND AXILLARY SENTINEL LYMPH NODE BX Left 08/30/2011   @[facility]  by dr d. poli;   CATARACT EXTRACTION W/ INTRAOCULAR LENS IMPLANT Bilateral 2018   COLONOSCOPY WITH PROPOFOL  N/A 10/27/2015   Procedure: COLONOSCOPY WITH PROPOFOL ;  Surgeon: Gustav Shila GAILS, MD;  Location: [facility];  Service: Endoscopy;  Laterality: N/A;   COLONOSCOPY WITH PROPOFOL  N/A 10/06/2021   Procedure: COLONOSCOPY WITH PROPOFOL ;  Surgeon: Shila Gustav GAILS, MD;  Location: [facility];  Service: Endoscopy;  Laterality: N/A;   DILATATION & CURRETTAGE/HYSTEROSCOPY WITH RESECTOCOPE N/A 02/28/2024   Procedure: DILATATION & CURETTAGE/HYSTEROSCOPY;  Surgeon: Okey Leader, MD;  Location[facility] OR;  Service: Gynecology;  Laterality: N/A;   HARDWARE REMOVAL Left  04/20/2015   Procedure: HARDWARE REMOVAL LEFT LUMBAR FIVE SCREW;  Surgeon: Darina Boehringer, MD;  Location: [facility] OR;  Service: Neurosurgery;  Laterality: Left;   HYSTEROSCOPY WITH D & C  10/04/2005   @[facility]  by dr a. okey;  polypectomy   INCISIONAL HERNIA REPAIR N/A 04/03/2024   Procedure: LYSIS OF ADHESIONS;  Surgeon: Lyndel Deward PARAS, MD;  Location: [facility];  Service: General;  Laterality: N/A;   INJECTION, FOR SENTINEL LYMPH NODE IDENTIFICATION N/A 04/03/2024   Procedure: INJECTION, FOR SENTINEL LYMPH NODE IDENTIFICATION;  Surgeon: [name], MD;  Location: [facility];  Service: Gynecology;  Laterality: N/A;   LYMPH NODE BIOPSY N/A 04/03/2024   Procedure: LYMPH NODE BIOPSY;  Surgeon: [name], MD;  Location: [facility];  Service: Gynecology;  Laterality: N/A;   POLYPECTOMY  10/06/2021   Procedure: POLYPECTOMY;  Surgeon: Shila Gustav GAILS, MD;  Location: [facility];  Service: Endoscopy;;   POSTERIOR FUSION LUMBAR SPINE  04/19/2020   @[facility]  by dr saunders. kritzer:  Laminectomy / Decompression  L2--3 /  interbody fusion w/ segmental instrumentation L2--L5   POSTERIOR LUMBAR FUSION 2 LEVEL  12/29/2014   @[facility]  by dr saunders. kritzer;   L3--4  and L4--5   ROBOTIC ASSISTED LAPAROSCOPIC LYSIS OF ADHESION  04/03/2024   Procedure: LYSIS, ADHESIONS, ROBOT-ASSISTED, LAPAROSCOPIC;  Surgeon: [name], MD;  Location: [facility];  Service: Gynecology;;   ROBOTIC ASSISTED TOTAL HYSTERECTOMY WITH BILATERAL SALPINGO OOPHERECTOMY Bilateral 04/03/2024   Procedure: HYSTERECTOMY, TOTAL, ROBOT-ASSISTED, LAPAROSCOPIC, WITH BILATERAL SALPINGO-OOPHORECTOMY;  Surgeon: [name], MD;  Location: [facility];  Service: Gynecology;  Laterality: Bilateral;  WITH POSSIBLE LAPAROTOMY   THYROID  CYST EXCISION  1994   benign   TOE SURGERY Left    TOTAL KNEE ARTHROPLASTY Left 08/19/2004   @[facility]  by dr r. gioffre   TOTAL KNEE ARTHROPLASTY Right 05/24/2005   @[facility]  by dr r. gioffre   TRANSPHENOIDAL PITUITARY RESECTION  01/25/2010   @[facility]   by dr r. kritzer/ dr c. 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    SOCIAL HISTORY: Social History   Socioeconomic History   Marital status: Married    Spouse name: Alm   Number of children: 2   Years of education: 12   Highest education level: Not on file  Occupational History   Occupation: Retired Network engineer  Tobacco Use   Smoking status: Never   Smokeless tobacco: Never  Vaping Use   Vaping status: Never Used  Substance and Sexual Activity  Alcohol use: No   Drug use: Never   Sexual activity: Not Currently    Partners: Male    Birth control/protection: Post-menopausal  Other Topics Concern   Not on file  Social History Narrative   Lives with husband   Caffeine use: 2-3 servings per day   Social Drivers of Health   Financial Resource Strain: Not on file  Food Insecurity: No Food Insecurity (04/03/2024)   Hunger Vital Sign    Worried About Running Out of Food in the Last Year: Never true    Ran Out of Food in the Last Year: Never true  Transportation Needs: No Transportation Needs (04/03/2024)   PRAPARE - Administrator, Civil Service (Medical): No    Lack of Transportation (Non-Medical): No  Physical Activity: Not on file  Stress: Not on file  Social Connections: Patient Declined (04/03/2024)   Social Connection and Isolation Panel    Frequency of Communication with Friends and Family: Patient declined    Frequency of Social Gatherings with Friends and Family: Patient declined    Attends Religious Services: Patient declined    Database administrator or Organizations: Patient declined    Attends Banker Meetings: Patient declined    Marital Status: Patient declined  Intimate Partner Violence: Not At Risk (04/03/2024)   Humiliation, Afraid, Rape, and Kick questionnaire    Fear of Current or Ex-Partner: No    Emotionally Abused: No    Physically Abused: No    Sexually Abused: No    FAMILY  HISTORY: Family History  Problem Relation Age of Onset   Stroke Mother    Diabetes Mother    Hypertension Mother    Hyperlipidemia Mother    Obesity Mother    Heart disease Father    Clotting disorder Father    Stroke Father    Hypertension Father    Heart Problems Father    Breast cancer Sister 62   Colon cancer Neg Hx    Neuropathy Neg Hx     ALLERGIES:  is allergic to lyrica  [pregabalin ], cymbalta [duloxetine hcl], gabapentin , lisinopril , maxzide [hydrochlorothiazide -triamterene], niacin , pravastatin sodium, simvastatin , and lodine [etodolac].  MEDICATIONS:  Current Outpatient Medications  Medication Sig Dispense Refill   dexamethasone  (DECADRON ) 4 MG tablet Take 2 tabs at the night before and 2 tab the morning of chemotherapy, every 3 weeks, by mouth x 6 cycles 24 tablet 6   polyethylene glycol (MIRALAX  / GLYCOLAX ) 17 g packet Take 17 g by mouth daily.     acetaminophen  (TYLENOL ) 500 MG tablet Take 1,000 mg by mouth daily as needed for headache, mild pain (pain score 1-3) or moderate pain (pain score 4-6).     aspirin  EC 81 MG tablet Take 81 mg by mouth daily. Swallow whole.     atorvastatin  (LIPITOR) 20 MG tablet TAKE 1 TABLET(20 MG) BY MOUTH AT BEDTIME 90 tablet 3   Cholecalciferol  (VITAMIN D -3) 25 MCG (1000 UT) CAPS Take 1 capsule by mouth daily.     clotrimazole-betamethasone  (LOTRISONE) cream Apply 1 Application topically 2 (two) times daily.     escitalopram  (LEXAPRO ) 20 MG tablet Take 20 mg by mouth daily.     ezetimibe  (ZETIA ) 10 MG tablet Take 10 mg by mouth at bedtime.     Glycerin-Polysorbate 80 (REFRESH DRY EYE THERAPY OP) Place 1 drop into both eyes in the morning and at bedtime.     hydrALAZINE (APRESOLINE) 25 MG tablet Take 25 mg by mouth daily. (Patient not taking:  Reported on 03/28/2024)     hydrochlorothiazide  (HYDRODIURIL ) 25 MG tablet Take 1 tablet (25 mg total) by mouth daily. (Patient not taking: Reported on 03/28/2024) 30 tablet 0    HYDROcodone -acetaminophen  (NORCO/VICODIN) 5-325 MG tablet Take 1 tablet by mouth every 6 (six) hours as needed for severe pain (pain score 7-10). For AFTER surgery only, do not take and drive. Monitor tylenol  intake. 15 tablet 0   hydrOXYzine (ATARAX) 25 MG tablet Take 25 mg by mouth in the morning and at bedtime.     irbesartan  (AVAPRO ) 300 MG tablet Take 1 tablet (300 mg total) by mouth at bedtime. (Patient taking differently: Take 300 mg by mouth daily.) 30 tablet 0   lidocaine -prilocaine  (EMLA ) cream Apply to affected area once 30 g 3   metFORMIN  (GLUCOPHAGE ) 500 MG tablet Take 1 tablet (500 mg total) by mouth daily with breakfast. 90 tablet 0   nystatin powder Apply 1 Application topically 2 (two) times daily.     ondansetron  (ZOFRAN ) 8 MG tablet Take 1 tablet (8 mg total) by mouth every 8 (eight) hours as needed for nausea or vomiting. Start on the third day after carboplatin. 30 tablet 1   prochlorperazine  (COMPAZINE ) 10 MG tablet Take 1 tablet (10 mg total) by mouth every 6 (six) hours as needed for nausea or vomiting. 30 tablet 1   verapamil  (VERELAN  PM) 240 MG 24 hr capsule Take 240 mg by mouth at bedtime.     No current facility-administered medications for this visit.    REVIEW OF SYSTEMS: She had baseline peripheral neuropathy affecting her left foot more than the right as well as chronic constipation Constitutional: Denies fevers, chills or abnormal night sweats Eyes: Denies blurriness of vision, double vision or watery eyes Ears, nose, mouth, throat, and face: Denies mucositis or sore throat Respiratory: Denies cough, dyspnea or wheezes Cardiovascular: Denies palpitation, chest discomfort or lower extremity swelling Skin: Denies abnormal skin rashes Lymphatics: Denies new lymphadenopathy or easy bruising Behavioral/Psych: Mood is stable, no new changes  All other systems were reviewed with the patient and are negative.  PHYSICAL EXAMINATION: ECOG PERFORMANCE STATUS: 1 -  Symptomatic but completely ambulatory  Vitals:   04/15/24 1331  BP: (!) 151/97  Pulse: 65  Temp: 98 F (36.7 C)  SpO2: 100%   There were no vitals filed for this visit.  GENERAL:alert, no distress and comfortable SKIN: skin color, texture, turgor are normal, no rashes or significant lesions EYES: normal, conjunctiva are pink and non-injected, sclera clear OROPHARYNX:no exudate, no erythema and lips, buccal mucosa, and tongue normal  NECK: supple, thyroid  normal size, non-tender, without nodularity LYMPH:  no palpable lymphadenopathy in the cervical, axillary or inguinal LUNGS: clear to auscultation and percussion with normal breathing effort HEART: regular rate & rhythm and no murmurs and no lower extremity edema ABDOMEN:abdomen soft, non-tender and normal bowel sounds.  Noted well-healed surgical scar Musculoskeletal:no cyanosis of digits and no clubbing  PSYCH: alert & oriented x 3 with fluent speech NEURO: no focal motor/sensory deficits  LABORATORY DATA:  I have reviewed the data as listed Lab Results  Component Value Date   WBC 10.7 (H) 04/04/2024   HGB 10.6 (L) 04/04/2024   HCT 35.7 (L) 04/04/2024   MCV 79.7 (L) 04/04/2024   PLT 292 04/04/2024   Recent Labs    04/21/23 1416 09/17/23 0938 10/27/23 1819 02/28/24 1200 03/28/24 1014 04/04/24 0412  NA 139   < > 136 141 141 138  K 3.8   < >  4.2 3.9 4.5 4.5  CL 103   < > 105 111 108 104  CO2 26   < > 24 21* 23 22  GLUCOSE 92   < > 94 95 99 127*  BUN 18   < > 13 13 12 12   CREATININE 1.28*   < > 1.30* 1.21* 1.28* 1.27*  CALCIUM  10.7*   < > 10.2 10.1 10.5* 10.4*  GFRNONAA 43*   < > 42* 45* 42* 43*  PROT 7.5  --  7.5  --  7.5  --   ALBUMIN  4.0  --  3.7  --  3.9  --   AST 12*  --  22  --  17  --   ALT 10  --  14  --  11  --   ALKPHOS 73  --  78  --  93  --   BILITOT 0.6  --  0.6  --  0.6  --    < > = values in this interval not displayed.    RADIOGRAPHIC STUDIES: I have personally reviewed the radiological  images as listed and agreed with the findings in the report. VAS US  LOWER EXTREMITY VENOUS (DVT) Result Date: 04/06/2024  Lower Venous DVT Study Patient Name:  AZARIA STEGMAN  Date of Exam:   04/04/2024 Medical Rec #: [id number]         Accession #:    [id number] Date of Birth: [birth year]         Patient Gender: F Patient Age:   78 years Exam Location:  [place] Procedure:      VAS US  LOWER EXTREMITY VENOUS (DVT) Referring Phys: MELISSA CROSS --------------------------------------------------------------------------------  Indications: Chronic left lower extremity edema.  Risk Factors: Status post hysterectomy 04/03/24. Limitations: Body habitus and poor ultrasound/tissue interface. Comparison Study: Prior negative left LEV done 12/11/13 Performing Technologist: Alberta Lis RVS  Examination Guidelines: A complete evaluation includes B-mode imaging, spectral Doppler, color Doppler, and power Doppler as needed of all accessible portions of each vessel. Bilateral testing is considered an integral part of a complete examination. Limited examinations for reoccurring indications may be performed as noted. The reflux portion of the exam is performed with the patient in reverse Trendelenburg.  +-----+---------------+---------+-----------+----------+--------------+ RIGHTCompressibilityPhasicitySpontaneityPropertiesThrombus Aging +-----+---------------+---------+-----------+----------+--------------+ CFV  Full           Yes      Yes                                 +-----+---------------+---------+-----------+----------+--------------+ SFJ  Full                                                        +-----+---------------+---------+-----------+----------+--------------+   +---------+---------------+---------+-----------+----------+-------------------+ LEFT     CompressibilityPhasicitySpontaneityPropertiesThrombus Aging       +---------+---------------+---------+-----------+----------+-------------------+ CFV      Full           Yes      No                                       +---------+---------------+---------+-----------+----------+-------------------+ SFJ      Full                                                             +---------+---------------+---------+-----------+----------+-------------------+  FV Prox  Full           Yes      No                                       +---------+---------------+---------+-----------+----------+-------------------+ FV Mid   Full                                                             +---------+---------------+---------+-----------+----------+-------------------+ FV DistalFull           Yes      No                                       +---------+---------------+---------+-----------+----------+-------------------+ PFV      Full           Yes      No                                       +---------+---------------+---------+-----------+----------+-------------------+ POP                     Yes      No                   patent by color and                                                       Doppler             +---------+---------------+---------+-----------+----------+-------------------+ PTV      Full                                                             +---------+---------------+---------+-----------+----------+-------------------+ PERO     Full                                                             +---------+---------------+---------+-----------+----------+-------------------+ Gastroc  Full                                                             +---------+---------------+---------+-----------+----------+-------------------+     Summary: RIGHT: - No evidence of common femoral vein obstruction.   LEFT: - There is no evidence of deep vein thrombosis in the lower extremity.  - No cystic  structure  found in the popliteal fossa.  *See table(s) above for measurements and observations. Electronically signed by Gaile New MD on 04/06/2024 at 8:22:47 PM.    Final

## 2024-04-16 ENCOUNTER — Other Ambulatory Visit: Payer: Self-pay

## 2024-04-16 ENCOUNTER — Encounter (HOSPITAL_COMMUNITY): Payer: Self-pay | Admitting: Gynecologic Oncology

## 2024-04-16 NOTE — Progress Notes (Signed)
 Radiation Oncology         [phone] ________________________________  Initial Outpatient Consultation  Name: [name] MRN: [id number]  Date: 04/17/2024  DOB: 1946/05/07  RR:[id number], Elsie SAUNDERS, MD  [name], MD   REFERRING PHYSICIAN: [name], MD  DIAGNOSIS: There were no encounter diagnoses.  Stage IIIA1 grade 2 endometrioid endometrial adenocarcinoma   HISTORY OF PRESENT ILLNESS::[name] is a 78 y.o. female who is accompanied by ***. she is seen as a courtesy of Dr. Viktoria for an opinion concerning radiation therapy as part of management for her recently diagnosed endometrial cancer.   The patient presented for a CT a/p on 09/17/23 following complains of ongoing right lower quadrant abdominal/hip pain. Scan indicated a thickened endometrium with recommendation for further testing. Therefore, she underwent a Pelvic ultrasound on 09/26/2023 showed uterus measuring 9.7 x 5.5 x 6.3 with an endometrial lining thickened at 33.4 mm. Adnexa not identified but no cystic or solid masses noted.   In light of findings, she was referred to Dr. Viktoria and opted to proceed with a biopsy. She then underwent a hysteroscopy with endometrial biopsy was on 7/31. Surgical pathology indicated FIGO grade 2 endometrioid endometrial adenocarcinoma with extensive mucinous. Immunohistochemical stains are  performed.  Vimentin shows a mixed pattern with some positive and some  negative areas.  P16 and CEAm are patchy positive.  CD10 highlights  stroma, and napsin is negative.      Subsequently, she then underwent a robotic-assisted laparoscopic total hysterectomy with bilateral salpingo-oophorectomy, with biopsy and resection of left adnexal mass on 04/03/24 under the care of Dr. Viktoria. Surgical pathology indicated FIGO grade 2 of 3 endometrioid carcinoma with mucinous features with 65% involvement of the myometrium. Left fallopian tube and ovary with focal surface involvement  by endometrioid carcinoma.examined lymph nodes are negative for malignancy. MMR deficient, p53 wild-type, HER2/neu 0%. Adnexal mass resection pathology showed metastatic endometrioid carcinoma with squamous metaplasia and areas  of geographic necrosis.        Patient was seen in consultation with Dr. Lonn on 04/15/24 to discuss further treatment plan. Upon discussion, patient opted to proceed with paclitaxel plus carboplatin and pembrolizumab per Dr. Lonn recommendation.     PREVIOUS RADIATION THERAPY: No  PAST MEDICAL HISTORY:  Past Medical History:  Diagnosis Date   Anemia    Anxiety    Cancer (HCC)    breast treated with radiation  endometrial   Chronic fatigue syndrome    Chronic low back pain    followed by dr jinny. mavis   CKD (chronic kidney disease), stage III (HCC)    followed by pcp   Constipation 02/20/2024   02-21-2024  pt stated viist w/ PCP due to constipation given stool softner and abdominal xray done   Coronary artery calcification 05/2022   cardiologist--- dr s. michele;   CT 11/ 2023 ;  CCTA 09-04-2022  calcium  score=36.9 involving LAD w/ mild nonob dz;  NUC 08-14-2022  LR w/ mild perfusion defect reversible, nuclear ef 60%   DDD (degenerative disc disease), lumbar    Depression    Diverticulosis of colon    DOE (dyspnea on exertion)    02-21-2024  sob w/ stairs/ long distance walking/  okay with household chores ,  per cardiologist note 09/2023  chronic DOE stable   Edema of both lower extremities    02-21-2024 pt stated left > right   Flat foot, acquired    GERD (gastroesophageal reflux disease)    History  of adenomatous polyp of colon    GI-- dr shila   History of gout    History of kidney stones    History of left breast cancer 2013   oncologist--- dr layla (lov 09-06-2017 released w/ no recurrence) ;  08-30-2011  s/p left lumpectomy w/ node dissection;  Stage IAG1, IDC, ER/PR+, nodes negative;  completed IMRT 11-24-2011 ,  completed tamoxifen  02/  2019   Hyperlipidemia    Hypertension    Lumbar stenosis with neurogenic claudication    per pt left leg   OA (osteoarthritis)    OSA on CPAP 07/2004   followed by [facility]--- dr jude;   sleep study in epic 08-05-2004 severe osa, (02-21-2024 pt stated uses cpap nightly)   Peripheral neuropathy    Personal history of radiation therapy 09/2011   left breast/ chest wall   10-29-2011  to  11-24-2011  6100 cGy   Pneumonia    Rash    02-21-2024  pt stated has rash under abd fold and between thighs   Sickle cell trait (HCC)    Thickened endometrium    Tinnitus of both ears    Type 2 diabetes mellitus (HCC)    followed by pcp:   (02-21-2024  pt stated checks  blood sugar 2 times montly , not fasting)   Wears partial dentures    upper    PAST SURGICAL HISTORY: Past Surgical History:  Procedure Laterality Date   APPENDECTOMY  1978   BIOPSY  10/06/2021   Procedure: BIOPSY;  Surgeon: shila Gustav GAILS, MD;  Location: [facility];  Service: Endoscopy;;   BLEPHAROPLASTY Right    age 10s---  right upper eyelid   BREAST LUMPECTOMY WITH NEEDLE LOCALIZATION Right 02/06/2013   Procedure: RIGHT BREAST LUMPECTOMY WITH NEEDLE LOCALIZATION;  Surgeon: Vicenta DELENA Poli, MD;  Location: [facility];  Service: General;  Laterality: Right;   BREAST LUMPECTOMY WITH NEEDLE LOCALIZATION AND AXILLARY SENTINEL LYMPH NODE BX Left 08/30/2011   @[facility]  by dr d. poli;   CATARACT EXTRACTION W/ INTRAOCULAR LENS IMPLANT Bilateral 2018   COLONOSCOPY WITH PROPOFOL  N/A 10/27/2015   Procedure: COLONOSCOPY WITH PROPOFOL ;  Surgeon: Gustav shila GAILS, MD;  Location: [facility];  Service: Endoscopy;  Laterality: N/A;   COLONOSCOPY WITH PROPOFOL  N/A 10/06/2021   Procedure: COLONOSCOPY WITH PROPOFOL ;  Surgeon: shila Gustav GAILS, MD;  Location: [facility];  Service: Endoscopy;  Laterality: N/A;   DILATATION & CURRETTAGE/HYSTEROSCOPY WITH RESECTOCOPE N/A 02/28/2024   Procedure: DILATATION & CURETTAGE/HYSTEROSCOPY;  Surgeon:  Okey Leader, MD;  Location: [facility] OR;  Service: Gynecology;  Laterality: N/A;   HARDWARE REMOVAL Left 04/20/2015   Procedure: HARDWARE REMOVAL LEFT LUMBAR FIVE SCREW;  Surgeon: Darina Boehringer, MD;  Location: [facility] OR;  Service: Neurosurgery;  Laterality: Left;   HYSTEROSCOPY WITH D & C  10/04/2005   @[facility]  by dr a. okey;  polypectomy   INCISIONAL HERNIA REPAIR N/A 04/03/2024   Procedure: LYSIS OF ADHESIONS;  Surgeon: Lyndel Deward PARAS, MD;  Location: [facility];  Service: General;  Laterality: N/A;   INJECTION, FOR SENTINEL LYMPH NODE IDENTIFICATION N/A 04/03/2024   Procedure: INJECTION, FOR SENTINEL LYMPH NODE IDENTIFICATION;  Surgeon: [name], MD;  Location: [facility];  Service: Gynecology;  Laterality: N/A;   LYMPH NODE BIOPSY N/A 04/03/2024   Procedure: LYMPH NODE BIOPSY;  Surgeon: [name], MD;  Location: [facility];  Service: Gynecology;  Laterality: N/A;   POLYPECTOMY  10/06/2021   Procedure: POLYPECTOMY;  Surgeon: shila Gustav GAILS, MD;  Location:  [facility];  Service: Endoscopy;;   POSTERIOR FUSION LUMBAR SPINE  04/19/2020   @[facility]  by dr r. kritzer:  Laminectomy / Decompression  L2--3 /  interbody fusion w/ segmental instrumentation L2--L5   POSTERIOR LUMBAR FUSION 2 LEVEL  12/29/2014   @[facility]  by dr jonelle. kritzer;   L3--4  and L4--5   ROBOTIC ASSISTED LAPAROSCOPIC LYSIS OF ADHESION  04/03/2024   Procedure: LYSIS, ADHESIONS, ROBOT-ASSISTED, LAPAROSCOPIC;  Surgeon: Viktoria Comer JONELLE, MD;  Location: [facility];  Service: Gynecology;;   ROBOTIC ASSISTED TOTAL HYSTERECTOMY WITH BILATERAL SALPINGO OOPHERECTOMY Bilateral 04/03/2024   Procedure: HYSTERECTOMY, TOTAL, ROBOT-ASSISTED, LAPAROSCOPIC, WITH BILATERAL SALPINGO-OOPHORECTOMY;  Surgeon: Viktoria Comer JONELLE, MD;  Location: [facility];  Service: Gynecology;  Laterality: Bilateral;  WITH POSSIBLE LAPAROTOMY   THYROID  CYST EXCISION  1994   benign   TOE SURGERY Left    TOTAL KNEE ARTHROPLASTY Left 08/19/2004   @[facility]  by dr r. gioffre   TOTAL KNEE  ARTHROPLASTY Right 05/24/2005   @[facility]  by dr r. gioffre   TRANSPHENOIDAL PITUITARY RESECTION  01/25/2010   @[facility]  by dr r. kritzer/ dr c. 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    FAMILY HISTORY:  Family History  Problem Relation Age of Onset   Stroke Mother    Diabetes Mother    Hypertension Mother    Hyperlipidemia Mother    Obesity Mother    Heart disease Father    Clotting disorder Father    Stroke Father    Hypertension Father    Heart Problems Father    Breast cancer Sister 61   Colon cancer Neg Hx    Neuropathy Neg Hx     SOCIAL HISTORY:  Social History   Tobacco Use   Smoking status: Never   Smokeless tobacco: Never  Vaping Use   Vaping status: Never Used  Substance Use Topics   Alcohol use: No   Drug use: Never    ALLERGIES:  Allergies  Allergen Reactions   Lyrica  [Pregabalin ]     Dizziness   Cymbalta [Duloxetine Hcl] Other (See Comments)    Stomach pain   Gabapentin  Itching   Lisinopril  Swelling   Maxzide [Hydrochlorothiazide -Triamterene] Other (See Comments)    Cramping    Niacin  Itching   Pravastatin Sodium Other (See Comments)    Headache    Simvastatin  Other (See Comments)    Memory loss   Lodine [Etodolac] Itching    MEDICATIONS:  Current Outpatient Medications  Medication Sig Dispense Refill   acetaminophen  (TYLENOL ) 500 MG tablet Take 1,000 mg by mouth daily as needed for headache, mild pain (pain score 1-3) or moderate pain (pain score 4-6).     aspirin  EC 81 MG tablet Take 81 mg by mouth daily. Swallow whole.     atorvastatin  (LIPITOR) 20 MG tablet TAKE 1 TABLET(20 MG) BY MOUTH AT BEDTIME 90 tablet 3   Cholecalciferol  (VITAMIN D -3) 25 MCG (1000 UT) CAPS Take 1 capsule by mouth daily.     clotrimazole-betamethasone  (LOTRISONE) cream Apply 1 Application topically 2 (two) times daily.     dexamethasone  (DECADRON ) 4 MG tablet Take 2 tabs at the night before and 2 tab the morning of chemotherapy, every 3  weeks, by mouth x 6 cycles 24 tablet 6   escitalopram  (LEXAPRO ) 20 MG tablet Take 20 mg by mouth daily.     ezetimibe  (ZETIA ) 10 MG tablet Take 10 mg by mouth at bedtime.     Glycerin-Polysorbate 80 (REFRESH DRY EYE THERAPY OP) Place 1 drop  into both eyes in the morning and at bedtime.     hydrALAZINE (APRESOLINE) 25 MG tablet Take 25 mg by mouth daily. (Patient not taking: Reported on 03/28/2024)     hydrochlorothiazide  (HYDRODIURIL ) 25 MG tablet Take 1 tablet (25 mg total) by mouth daily. (Patient not taking: Reported on 03/28/2024) 30 tablet 0   HYDROcodone -acetaminophen  (NORCO/VICODIN) 5-325 MG tablet Take 1 tablet by mouth every 6 (six) hours as needed for severe pain (pain score 7-10). For AFTER surgery only, do not take and drive. Monitor tylenol  intake. 15 tablet 0   hydrOXYzine (ATARAX) 25 MG tablet Take 25 mg by mouth in the morning and at bedtime.     irbesartan  (AVAPRO ) 300 MG tablet Take 1 tablet (300 mg total) by mouth at bedtime. (Patient taking differently: Take 300 mg by mouth daily.) 30 tablet 0   lidocaine -prilocaine  (EMLA ) cream Apply to affected area once 30 g 3   metFORMIN  (GLUCOPHAGE ) 500 MG tablet Take 1 tablet (500 mg total) by mouth daily with breakfast. 90 tablet 0   nystatin powder Apply 1 Application topically 2 (two) times daily.     ondansetron  (ZOFRAN ) 8 MG tablet Take 1 tablet (8 mg total) by mouth every 8 (eight) hours as needed for nausea or vomiting. Start on the third day after carboplatin. 30 tablet 1   polyethylene glycol (MIRALAX  / GLYCOLAX ) 17 g packet Take 17 g by mouth daily.     prochlorperazine  (COMPAZINE ) 10 MG tablet Take 1 tablet (10 mg total) by mouth every 6 (six) hours as needed for nausea or vomiting. 30 tablet 1   verapamil  (VERELAN  PM) 240 MG 24 hr capsule Take 240 mg by mouth at bedtime.     No current facility-administered medications for this visit.    REVIEW OF SYSTEMS:  A 10+ POINT REVIEW OF SYSTEMS WAS OBTAINED including neurology,  dermatology, psychiatry, cardiac, respiratory, lymph, extremities, GI, GU, musculoskeletal, constitutional, reproductive, HEENT. ***   PHYSICAL EXAM:  vitals were not taken for this visit.   General: Alert and oriented, in no acute distress HEENT: Head is normocephalic. Extraocular movements are intact. Oropharynx is clear. Neck: Neck is supple, no palpable cervical or supraclavicular lymphadenopathy. Heart: Regular in rate and rhythm with no murmurs, rubs, or gallops. Chest: Clear to auscultation bilaterally, with no rhonchi, wheezes, or rales. Abdomen: Soft, nontender, nondistended, with no rigidity or guarding. Extremities: No cyanosis or edema. Lymphatics: see Neck Exam Skin: No concerning lesions. Musculoskeletal: symmetric strength and muscle tone throughout. Neurologic: Cranial nerves II through XII are grossly intact. No obvious focalities. Speech is fluent. Coordination is intact. Psychiatric: Judgment and insight are intact. Affect is appropriate. ***  ECOG = ***  0 - Asymptomatic (Fully active, able to carry on all predisease activities without restriction)  1 - Symptomatic but completely ambulatory (Restricted in physically strenuous activity but ambulatory and able to carry out work of a light or sedentary nature. For example, light housework, office work)  2 - Symptomatic, <50% in bed during the day (Ambulatory and capable of all self care but unable to carry out any work activities. Up and about more than 50% of waking hours)  3 - Symptomatic, >50% in bed, but not bedbound (Capable of only limited self-care, confined to bed or chair 50% or more of waking hours)  4 - Bedbound (Completely disabled. Cannot carry on any self-care. Totally confined to bed or chair)  5 - Death   Raylene MM, Creech RH, Tormey DC, et al. [phone]). Toxicity  and response criteria of the [facility]. Am. DOROTHA Bridges. Oncol. 5 (6): 649-55  LABORATORY DATA:  Lab Results  Component  Value Date   WBC 10.7 (H) 04/04/2024   HGB 10.6 (L) 04/04/2024   HCT 35.7 (L) 04/04/2024   MCV 79.7 (L) 04/04/2024   PLT 292 04/04/2024   NEUTROABS 5.1 09/17/2023   Lab Results  Component Value Date   NA 138 04/04/2024   K 4.5 04/04/2024   CL 104 04/04/2024   CO2 22 04/04/2024   GLUCOSE 127 (H) 04/04/2024   BUN 12 04/04/2024   CREATININE 1.27 (H) 04/04/2024   CALCIUM  10.4 (H) 04/04/2024      RADIOGRAPHY: VAS US  LOWER EXTREMITY VENOUS (DVT) Result Date: 04/06/2024  Lower Venous DVT Study Patient Name:  [name]  Date of Exam:   04/04/2024 Medical Rec #: [id number]         Accession #:    [id number] Date of Birth: 12/08/1945         Patient Gender: F Patient Age:   7 years Exam Location:  [facility] Procedure:      VAS US  LOWER EXTREMITY VENOUS (DVT) Referring Phys: MELISSA CROSS --------------------------------------------------------------------------------  Indications: Chronic left lower extremity edema.  Risk Factors: Status post hysterectomy 04/03/24. Limitations: Body habitus and poor ultrasound/tissue interface. Comparison Study: Prior negative left LEV done 12/11/13 Performing Technologist: Alberta Lis RVS  Examination Guidelines: A complete evaluation includes B-mode imaging, spectral Doppler, color Doppler, and power Doppler as needed of all accessible portions of each vessel. Bilateral testing is considered an integral part of a complete examination. Limited examinations for reoccurring indications may be performed as noted. The reflux portion of the exam is performed with the patient in reverse Trendelenburg.  +-----+---------------+---------+-----------+----------+--------------+ RIGHTCompressibilityPhasicitySpontaneityPropertiesThrombus Aging +-----+---------------+---------+-----------+----------+--------------+ CFV  Full           Yes      Yes                                 +-----+---------------+---------+-----------+----------+--------------+ SFJ   Full                                                        +-----+---------------+---------+-----------+----------+--------------+   +---------+---------------+---------+-----------+----------+-------------------+ LEFT     CompressibilityPhasicitySpontaneityPropertiesThrombus Aging      +---------+---------------+---------+-----------+----------+-------------------+ CFV      Full           Yes      No                                       +---------+---------------+---------+-----------+----------+-------------------+ SFJ      Full                                                             +---------+---------------+---------+-----------+----------+-------------------+ FV Prox  Full           Yes      No                                       +---------+---------------+---------+-----------+----------+-------------------+  FV Mid   Full                                                             +---------+---------------+---------+-----------+----------+-------------------+ FV DistalFull           Yes      No                                       +---------+---------------+---------+-----------+----------+-------------------+ PFV      Full           Yes      No                                       +---------+---------------+---------+-----------+----------+-------------------+ POP                     Yes      No                   patent by color and                                                       Doppler             +---------+---------------+---------+-----------+----------+-------------------+ PTV      Full                                                             +---------+---------------+---------+-----------+----------+-------------------+ PERO     Full                                                             +---------+---------------+---------+-----------+----------+-------------------+ Gastroc  Full                                                              +---------+---------------+---------+-----------+----------+-------------------+     Summary: RIGHT: - No evidence of common femoral vein obstruction.   LEFT: - There is no evidence of deep vein thrombosis in the lower extremity.  - No cystic structure found in the popliteal fossa.  *See table(s) above for measurements and observations. Electronically signed by Gaile New MD on 04/06/2024 at 8:22:47 PM.    Final       IMPRESSION: Stage IIIA1 grade 2 endometrioid endometrial adenocarcinoma   ***  Today, I talked to the patient and family about the findings and work-up thus  far.  We discussed the natural history of *** and general treatment, highlighting the role of radiotherapy in the management.  We discussed the available radiation techniques, and focused on the details of logistics and delivery.  We reviewed the anticipated acute and late sequelae associated with radiation in this setting.  The patient was encouraged to ask questions that I answered to the best of my ability. *** A patient consent form was discussed and signed.  We retained a copy for our records.  The patient would like to proceed with radiation and will be scheduled for CT simulation.  PLAN: ***    *** minutes of total time was spent for this patient encounter, including preparation, face-to-face counseling with the patient and coordination of care, physical exam, and documentation of the encounter.   ------------------------------------------------  Lynwood CHARM Nasuti, PhD, MD  This document serves as a record of services personally performed by Lynwood Nasuti, MD. It was created on his behalf by Reymundo Cartwright, a trained medical scribe. The creation of this record is based on the scribe's personal observations and the provider's statements to them. This document has been checked and approved by the attending provider.

## 2024-04-17 ENCOUNTER — Ambulatory Visit
Admission: RE | Admit: 2024-04-17 | Discharge: 2024-04-17 | Disposition: A | Discharge status: Home / Self Care | Source: Ambulatory Visit | Attending: Radiation Oncology | Admitting: Radiation Oncology

## 2024-04-17 ENCOUNTER — Encounter: Payer: Self-pay | Admitting: Radiation Oncology

## 2024-04-17 ENCOUNTER — Encounter: Payer: Self-pay | Admitting: Hematology and Oncology

## 2024-04-17 ENCOUNTER — Encounter: Payer: Self-pay | Admitting: Licensed Clinical Social Worker

## 2024-04-17 VITALS — BP 198/84

## 2024-04-17 DIAGNOSIS — D573 Sickle-cell trait: Secondary | ICD-10-CM | POA: Insufficient documentation

## 2024-04-17 DIAGNOSIS — Z90710 Acquired absence of both cervix and uterus: Secondary | ICD-10-CM | POA: Diagnosis not present

## 2024-04-17 DIAGNOSIS — Z853 Personal history of malignant neoplasm of breast: Secondary | ICD-10-CM | POA: Insufficient documentation

## 2024-04-17 DIAGNOSIS — I129 Hypertensive chronic kidney disease with stage 1 through stage 4 chronic kidney disease, or unspecified chronic kidney disease: Secondary | ICD-10-CM | POA: Insufficient documentation

## 2024-04-17 DIAGNOSIS — G9332 Myalgic encephalomyelitis/chronic fatigue syndrome: Secondary | ICD-10-CM | POA: Insufficient documentation

## 2024-04-17 DIAGNOSIS — N183 Chronic kidney disease, stage 3 unspecified: Secondary | ICD-10-CM | POA: Diagnosis not present

## 2024-04-17 DIAGNOSIS — Z7982 Long term (current) use of aspirin: Secondary | ICD-10-CM | POA: Insufficient documentation

## 2024-04-17 DIAGNOSIS — K5900 Constipation, unspecified: Secondary | ICD-10-CM | POA: Insufficient documentation

## 2024-04-17 DIAGNOSIS — Z90722 Acquired absence of ovaries, bilateral: Secondary | ICD-10-CM | POA: Insufficient documentation

## 2024-04-17 DIAGNOSIS — C541 Malignant neoplasm of endometrium: Secondary | ICD-10-CM

## 2024-04-17 DIAGNOSIS — M51369 Other intervertebral disc degeneration, lumbar region without mention of lumbar back pain or lower extremity pain: Secondary | ICD-10-CM | POA: Insufficient documentation

## 2024-04-17 DIAGNOSIS — Z87442 Personal history of urinary calculi: Secondary | ICD-10-CM | POA: Diagnosis not present

## 2024-04-17 DIAGNOSIS — I2510 Atherosclerotic heart disease of native coronary artery without angina pectoris: Secondary | ICD-10-CM | POA: Diagnosis not present

## 2024-04-17 DIAGNOSIS — Z7984 Long term (current) use of oral hypoglycemic drugs: Secondary | ICD-10-CM | POA: Diagnosis not present

## 2024-04-17 DIAGNOSIS — Z79899 Other long term (current) drug therapy: Secondary | ICD-10-CM | POA: Insufficient documentation

## 2024-04-17 DIAGNOSIS — Z860101 Personal history of adenomatous and serrated colon polyps: Secondary | ICD-10-CM | POA: Diagnosis not present

## 2024-04-17 DIAGNOSIS — M1990 Unspecified osteoarthritis, unspecified site: Secondary | ICD-10-CM | POA: Insufficient documentation

## 2024-04-17 DIAGNOSIS — Z923 Personal history of irradiation: Secondary | ICD-10-CM | POA: Insufficient documentation

## 2024-04-17 DIAGNOSIS — E785 Hyperlipidemia, unspecified: Secondary | ICD-10-CM | POA: Insufficient documentation

## 2024-04-17 DIAGNOSIS — G8929 Other chronic pain: Secondary | ICD-10-CM | POA: Diagnosis not present

## 2024-04-17 DIAGNOSIS — K5730 Diverticulosis of large intestine without perforation or abscess without bleeding: Secondary | ICD-10-CM | POA: Insufficient documentation

## 2024-04-17 DIAGNOSIS — E1122 Type 2 diabetes mellitus with diabetic chronic kidney disease: Secondary | ICD-10-CM | POA: Insufficient documentation

## 2024-04-17 DIAGNOSIS — M25559 Pain in unspecified hip: Secondary | ICD-10-CM | POA: Diagnosis not present

## 2024-04-18 ENCOUNTER — Telehealth: Payer: Self-pay | Admitting: Radiation Oncology

## 2024-04-18 NOTE — Telephone Encounter (Signed)
 9/19 Outgoing referral forward to Michelle Zavala - Medical Oncology Social Worker.

## 2024-04-21 ENCOUNTER — Telehealth: Payer: Self-pay | Admitting: Licensed Clinical Social Worker

## 2024-04-21 ENCOUNTER — Inpatient Hospital Stay: Admitting: Licensed Clinical Social Worker

## 2024-04-21 ENCOUNTER — Other Ambulatory Visit: Payer: Self-pay | Admitting: Oncology

## 2024-04-21 NOTE — Progress Notes (Signed)
 [facility] Clinical Social Work  Clinical Social Work was referred by Engineer, civil (consulting) for [employer] needs (food).  Clinical Social Worker contacted patient by phone to offer support and assess for needs.    Pt reports that money becomes tight at the end of the month before the next soc. Security check comes, but that she & her husband are never completely without food. She appreciated the bag from the [facility] food pantry and wants to use that resource sparingly to save it for people more in need.   Interventions: Reminded pt of [facility] food pantry availability. Offered information on local food pantries      Follow Up Plan:  Patient will contact CSW with any support or resource needs    Sheniah Supak E Raylen Tangonan, LCSW  Clinical Social Worker [facility]

## 2024-04-21 NOTE — Telephone Encounter (Signed)
 [facility] Clinical Social Work  Clinical Social Work was referred by rad onc for SDOH needs (food).  Clinical Social Worker attempted to contact patient by phone to offer support and assess for needs.    No answer. Left VM with direct contact information and reason for call.      Follow Up Plan:  CSW will see patient on 05/02/24 during infusion. Can provide another bag from the [facility] food pantry & local food pantry list at that time    Jules Vidovich E Bryonna Sundby, LCSW  Clinical Social Worker [facility]

## 2024-04-21 NOTE — Progress Notes (Signed)
 Gynecologic Oncology Multi-Disciplinary Disposition Conference Note  Date of the Conference: 04/21/2024  Patient Name: [name]  Referring Provider: Dr. Okey Primary GYN Oncologist: Dr. Viktoria   Stage/Disposition:  Stage IIIA1 grade 2 endometrioid endometrial adenocarcinoma . Disposition is to adjuvant chemotherapy and vaginal brachytherapy.   This Multidisciplinary conference took place involving physicians from Gynecologic Oncology, Medical Oncology, Radiation Oncology, Pathology, Radiology along with the Gynecologic Oncology Nurse Practitioner and Gynecologic Oncology Nurse Navigator.  Comprehensive assessment of the patient's malignancy, staging, need for surgery, chemotherapy, radiation therapy, and need for further testing were reviewed. Supportive measures, both inpatient and following discharge were also discussed. The recommended plan of care is documented. Greater than 35 minutes were spent correlating and coordinating this patient's care.

## 2024-04-22 DIAGNOSIS — M19072 Primary osteoarthritis, left ankle and foot: Secondary | ICD-10-CM | POA: Diagnosis not present

## 2024-04-22 DIAGNOSIS — L603 Nail dystrophy: Secondary | ICD-10-CM | POA: Diagnosis not present

## 2024-04-22 DIAGNOSIS — M2142 Flat foot [pes planus] (acquired), left foot: Secondary | ICD-10-CM | POA: Diagnosis not present

## 2024-04-22 DIAGNOSIS — B351 Tinea unguium: Secondary | ICD-10-CM | POA: Diagnosis not present

## 2024-04-22 DIAGNOSIS — M2141 Flat foot [pes planus] (acquired), right foot: Secondary | ICD-10-CM | POA: Diagnosis not present

## 2024-04-22 DIAGNOSIS — E1151 Type 2 diabetes mellitus with diabetic peripheral angiopathy without gangrene: Secondary | ICD-10-CM | POA: Diagnosis not present

## 2024-04-22 DIAGNOSIS — M19071 Primary osteoarthritis, right ankle and foot: Secondary | ICD-10-CM | POA: Diagnosis not present

## 2024-04-23 ENCOUNTER — Other Ambulatory Visit: Payer: Self-pay

## 2024-04-23 ENCOUNTER — Telehealth: Payer: Self-pay | Admitting: Oncology

## 2024-04-23 DIAGNOSIS — Z23 Encounter for immunization: Secondary | ICD-10-CM | POA: Diagnosis not present

## 2024-04-23 NOTE — Telephone Encounter (Signed)
 Called [name] an reviewed instructions for her port placement appointment on 04/28/24: Arrive at [facility] at 9:00, NPO after midnight the night before, that she will need a driver and someone to stay with her for 24 hours afterwards.  She verbalized understanding and agreement of the appointment and instructions.

## 2024-04-24 NOTE — Progress Notes (Signed)
 Pharmacist Chemotherapy Monitoring - Initial Assessment    Anticipated start date: 05/02/24   The following has been reviewed per standard work regarding the patient's treatment regimen: The patient's diagnosis, treatment plan and drug doses, and organ/hematologic function Lab orders and baseline tests specific to treatment regimen  The treatment plan start date, drug sequencing, and pre-medications Prior authorization status  Patient's documented medication list, including drug-drug interaction screen and prescriptions for anti-emetics and supportive care specific to the treatment regimen The drug concentrations, fluid compatibility, administration routes, and timing of the medications to be used The patient's access for treatment and lifetime cumulative dose history, if applicable  The patient's medication allergies and previous infusion related reactions, if applicable   Changes made to treatment plan:  N/A  Follow up needed:  Pending authorization for treatment  and port placement   Tava Peery, PharmD, MBA

## 2024-04-25 ENCOUNTER — Other Ambulatory Visit: Payer: Self-pay | Admitting: Student

## 2024-04-28 ENCOUNTER — Other Ambulatory Visit: Payer: Self-pay

## 2024-04-28 ENCOUNTER — Encounter (HOSPITAL_COMMUNITY): Payer: Self-pay

## 2024-04-28 ENCOUNTER — Ambulatory Visit (HOSPITAL_COMMUNITY)
Admission: RE | Admit: 2024-04-28 | Discharge: 2024-04-28 | Disposition: A | Discharge status: Home / Self Care | Source: Ambulatory Visit | Attending: Hematology and Oncology | Admitting: Hematology and Oncology

## 2024-04-28 DIAGNOSIS — E1122 Type 2 diabetes mellitus with diabetic chronic kidney disease: Secondary | ICD-10-CM | POA: Insufficient documentation

## 2024-04-28 DIAGNOSIS — N182 Chronic kidney disease, stage 2 (mild): Secondary | ICD-10-CM | POA: Diagnosis not present

## 2024-04-28 DIAGNOSIS — I2510 Atherosclerotic heart disease of native coronary artery without angina pectoris: Secondary | ICD-10-CM | POA: Diagnosis not present

## 2024-04-28 DIAGNOSIS — D573 Sickle-cell trait: Secondary | ICD-10-CM | POA: Insufficient documentation

## 2024-04-28 DIAGNOSIS — Z90710 Acquired absence of both cervix and uterus: Secondary | ICD-10-CM | POA: Insufficient documentation

## 2024-04-28 DIAGNOSIS — Z90722 Acquired absence of ovaries, bilateral: Secondary | ICD-10-CM | POA: Insufficient documentation

## 2024-04-28 DIAGNOSIS — I129 Hypertensive chronic kidney disease with stage 1 through stage 4 chronic kidney disease, or unspecified chronic kidney disease: Secondary | ICD-10-CM | POA: Insufficient documentation

## 2024-04-28 DIAGNOSIS — Z853 Personal history of malignant neoplasm of breast: Secondary | ICD-10-CM | POA: Diagnosis not present

## 2024-04-28 DIAGNOSIS — Z7984 Long term (current) use of oral hypoglycemic drugs: Secondary | ICD-10-CM | POA: Diagnosis not present

## 2024-04-28 DIAGNOSIS — C541 Malignant neoplasm of endometrium: Secondary | ICD-10-CM | POA: Insufficient documentation

## 2024-04-28 HISTORY — PX: IR IMAGING GUIDED PORT INSERTION: IMG5740

## 2024-04-28 LAB — GLUCOSE, CAPILLARY: Glucose-Capillary: 139 mg/dL — ABNORMAL HIGH (ref 70–99)

## 2024-04-28 MED ORDER — LIDOCAINE-EPINEPHRINE 1 %-1:100000 IJ SOLN
INTRAMUSCULAR | Status: AC
  Filled 2024-04-28: qty 1

## 2024-04-28 MED ORDER — LIDOCAINE HCL 1 % IJ SOLN
INTRAMUSCULAR | Status: AC
  Filled 2024-04-28: qty 20

## 2024-04-28 MED ORDER — FENTANYL CITRATE (PF) 100 MCG/2ML IJ SOLN
INTRAMUSCULAR | Status: AC
  Filled 2024-04-28: qty 2

## 2024-04-28 MED ORDER — FENTANYL CITRATE (PF) 100 MCG/2ML IJ SOLN
INTRAMUSCULAR | Status: AC | PRN
  Administered 2024-04-28 (×2): 25 ug via INTRAVENOUS
  Administered 2024-04-28: 50 ug via INTRAVENOUS
  Administered 2024-04-28 (×4): 25 ug via INTRAVENOUS

## 2024-04-28 MED ORDER — MIDAZOLAM HCL 2 MG/2ML IJ SOLN
INTRAMUSCULAR | Status: AC
  Filled 2024-04-28: qty 2

## 2024-04-28 MED ORDER — LIDOCAINE HCL 1 % IJ SOLN
20.0000 mL | Freq: Once | INTRAMUSCULAR | Status: AC
  Administered 2024-04-28: 20 mL

## 2024-04-28 MED ORDER — LIDOCAINE HCL (PF) 1 % IJ SOLN
10.0000 mL | Freq: Once | INTRAMUSCULAR | Status: AC
  Administered 2024-04-28: 10 mL

## 2024-04-28 MED ORDER — MIDAZOLAM HCL 2 MG/2ML IJ SOLN
INTRAMUSCULAR | Status: AC | PRN
  Administered 2024-04-28 (×4): 1 mg via INTRAVENOUS

## 2024-04-28 MED ORDER — HEPARIN SOD (PORK) LOCK FLUSH 100 UNIT/ML IV SOLN
INTRAVENOUS | Status: AC
  Filled 2024-04-28: qty 5

## 2024-04-28 MED ORDER — LIDOCAINE-EPINEPHRINE (PF) 1 %-1:200000 IJ SOLN
10.0000 mL | Freq: Once | INTRAMUSCULAR | Status: AC
  Administered 2024-04-28: 10 mL

## 2024-04-28 MED ORDER — SODIUM CHLORIDE 0.9 % IV SOLN: INTRAVENOUS | Status: DC

## 2024-04-28 MED ORDER — IOHEXOL 300 MG/ML  SOLN
50.0000 mL | Freq: Once | INTRAMUSCULAR | Status: AC | PRN
  Administered 2024-04-28: 20 mL via INTRAVENOUS

## 2024-04-28 NOTE — Progress Notes (Signed)
 Interventional Radiology Brief Note:  APP called to bedside for site evaluation due to pulsating sensation on the right.   PA to bedside.  Attempted procedure site on the right is soft, intact.  No bleeding or oozing.  No swelling or tenderness.   Bandage in place with scant bloody drainage.  Strong palpable and visible pulse in the neck due to prominent carotid.  No injury or area of concern.  Left-sided Port site without issue.   PA also notified of changes in BP.  Patient with decreased BP from baseline.  Most recent value 114/85 (65).  Patient stable, asymptomatic. No dizziness, lightheadedness.  Recently ambulated to bathroom without issue. Delay discharge 30 minutes to ensure stable BP.  If no significant change and remains asymptomatic may d/c home.   [name] Kleinsasser, MS RD PA-C

## 2024-04-28 NOTE — H&P (Signed)
 Chief Complaint: Patient was seen in consultation today for endometrial cancer  Referring Physician(s): Gorsuch,Ni  Supervising Physician: Philip Cornet  Patient Status: [facility] - Out-pt  History of Present Illness: [name] is a 78 y.o. female with pertinent medical history of CKD stage II, CAD, DM, sickle cel trait, left breast cancer s/p prior treatment in 2019 with new finding of endometrial cancer, incidentally found on CT imaging 09/2023.  She is now s/p complete hysterectomy, bilateral salpingo-oophorectomy, sentinel lymph node injection and biopsy and lysis of adhesions with plans for upcoming chemotherapy.  She is referred to [facility] today for [facility] placement at the request of Dr. Lonn.   Ms. [name] presents today in her usual state of health.  She has been NPO.  Her husband is available for care and transportation post procedure.   She is FULL CODE.   She is understanding of the goals of the procedure and is agreeable to proceed.   Past Medical History:  Diagnosis Date   Anemia    Anxiety    Cancer (HCC)    breast treated with radiation  endometrial   Chronic fatigue syndrome    Chronic low back pain    followed by dr jinny. mavis   CKD (chronic kidney disease), stage III (HCC)    followed by pcp   Constipation 02/20/2024   02-21-2024  pt stated viist w/ PCP due to constipation given stool softner and abdominal xray done   Coronary artery calcification 05/2022   cardiologist--- dr s. michele;   CT 11/ 2023 ;  CCTA 09-04-2022  calcium  score=36.9 involving LAD w/ mild nonob dz;  NUC 08-14-2022  LR w/ mild perfusion defect reversible, nuclear ef 60%   DDD (degenerative disc disease), lumbar    Depression    Diverticulosis of colon    DOE (dyspnea on exertion)    02-21-2024  sob w/ stairs/ long distance walking/  okay with household chores ,  per cardiologist note 09/2023  chronic DOE stable   Edema of both lower extremities    02-21-2024 pt stated left >  right   Flat foot, acquired    GERD (gastroesophageal reflux disease)    History of adenomatous polyp of colon    GI-- dr shila   History of gout    History of kidney stones    History of left breast cancer 2013   oncologist--- dr layla (lov 09-06-2017 released w/ no recurrence) ;  08-30-2011  s/p left lumpectomy w/ node dissection;  Stage IAG1, IDC, ER/PR+, nodes negative;  completed IMRT 11-24-2011 ,  completed tamoxifen  02/ 2019   Hyperlipidemia    Hypertension    Lumbar stenosis with neurogenic claudication    per pt left leg   OA (osteoarthritis)    OSA on CPAP 07/2004   followed by [facility]--- dr jude;   sleep study in epic 08-05-2004 severe osa, (02-21-2024 pt stated uses cpap nightly)   Peripheral neuropathy    Personal history of radiation therapy 09/2011   left breast/ chest wall   10-29-2011  to  11-24-2011  6100 cGy   Pneumonia    Rash    02-21-2024  pt stated has rash under abd fold and between thighs   Sickle cell trait    Thickened endometrium    Tinnitus of both ears    Type 2 diabetes mellitus (HCC)    followed by pcp:   (02-21-2024  pt stated checks  blood sugar 2 times montly , not fasting)  Wears partial dentures    upper    Past Surgical History:  Procedure Laterality Date   APPENDECTOMY  1978   BIOPSY  10/06/2021   Procedure: BIOPSY;  Surgeon: Shila Gustav GAILS, MD;  Location: [facility];  Service: Endoscopy;;   BLEPHAROPLASTY Right    age 14s---  right upper eyelid   BREAST LUMPECTOMY WITH NEEDLE LOCALIZATION Right 02/06/2013   Procedure: RIGHT BREAST LUMPECTOMY WITH NEEDLE LOCALIZATION;  Surgeon: Vicenta DELENA Poli, MD;  Location: [facility] OR;  Service: General;  Laterality: Right;   BREAST LUMPECTOMY WITH NEEDLE LOCALIZATION AND AXILLARY SENTINEL LYMPH NODE BX Left 08/30/2011   @[facility]  by dr d. poli;   CATARACT EXTRACTION W/ INTRAOCULAR LENS IMPLANT Bilateral 2018   COLONOSCOPY WITH PROPOFOL  N/A 10/27/2015   Procedure: COLONOSCOPY WITH PROPOFOL ;   Surgeon: Gustav Shila GAILS, MD;  Location: [facility];  Service: Endoscopy;  Laterality: N/A;   COLONOSCOPY WITH PROPOFOL  N/A 10/06/2021   Procedure: COLONOSCOPY WITH PROPOFOL ;  Surgeon: Shila Gustav GAILS, MD;  Location: [facility];  Service: Endoscopy;  Laterality: N/A;   DILATATION & CURRETTAGE/HYSTEROSCOPY WITH RESECTOCOPE N/A 02/28/2024   Procedure: DILATATION & CURETTAGE/HYSTEROSCOPY;  Surgeon: Okey Leader, MD;  Location: [facility] OR;  Service: Gynecology;  Laterality: N/A;   HARDWARE REMOVAL Left 04/20/2015   Procedure: HARDWARE REMOVAL LEFT LUMBAR FIVE SCREW;  Surgeon: Darina Boehringer, MD;  Location: [facility] OR;  Service: Neurosurgery;  Laterality: Left;   HYSTEROSCOPY WITH D & C  10/04/2005   @[facility]  by dr a. okey;  polypectomy   INCISIONAL HERNIA REPAIR N/A 04/03/2024   Procedure: LYSIS OF ADHESIONS;  Surgeon: Lyndel Deward PARAS, MD;  Location: [facility] ORS;  Service: General;  Laterality: N/A;   INJECTION, FOR SENTINEL LYMPH NODE IDENTIFICATION N/A 04/03/2024   Procedure: INJECTION, FOR SENTINEL LYMPH NODE IDENTIFICATION;  Surgeon: [name], MD;  Location: [facility];  Service: Gynecology;  Laterality: N/A;   LYMPH NODE BIOPSY N/A 04/03/2024   Procedure: LYMPH NODE BIOPSY;  Surgeon: [name], MD;  Location: [facility];  Service: Gynecology;  Laterality: N/A;   POLYPECTOMY  10/06/2021   Procedure: POLYPECTOMY;  Surgeon: Shila Gustav GAILS, MD;  Location: [facility];  Service: Endoscopy;;   POSTERIOR FUSION LUMBAR SPINE  04/19/2020   @[facility]  by dr r. kritzer:  Laminectomy / Decompression  L2--3 /  interbody fusion w/ segmental instrumentation L2--L5   POSTERIOR LUMBAR FUSION 2 LEVEL  12/29/2014   @[facility]  by dr saunders. kritzer;   L3--4  and L4--5   ROBOTIC ASSISTED LAPAROSCOPIC LYSIS OF ADHESION  04/03/2024   Procedure: LYSIS, ADHESIONS, ROBOT-ASSISTED, LAPAROSCOPIC;  Surgeon: [name], MD;  Location: [facility];  Service: Gynecology;;   ROBOTIC ASSISTED TOTAL HYSTERECTOMY WITH BILATERAL  SALPINGO OOPHERECTOMY Bilateral 04/03/2024   Procedure: HYSTERECTOMY, TOTAL, ROBOT-ASSISTED, LAPAROSCOPIC, WITH BILATERAL SALPINGO-OOPHORECTOMY;  Surgeon: [name], MD;  Location: [facility];  Service: Gynecology;  Laterality: Bilateral;  WITH POSSIBLE LAPAROTOMY   THYROID  CYST EXCISION  1994   benign   TOE SURGERY Left    TOTAL KNEE ARTHROPLASTY Left 08/19/2004   @[facility]  by dr r. gioffre   TOTAL KNEE ARTHROPLASTY Right 05/24/2005   @[facility]  by dr r. gioffre   TRANSPHENOIDAL PITUITARY RESECTION  01/25/2010   @[facility]  by dr r. kritzer/ dr c. 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 Authorizing Provider  acetaminophen  (TYLENOL ) 500 MG tablet Take 1,000 mg by mouth daily as needed for headache, mild pain (pain score 1-3) or moderate pain (pain score 4-6).   Yes [provider]  atorvastatin  (LIPITOR) 20 MG tablet TAKE 1 TABLET(20 MG) BY MOUTH AT BEDTIME 11/06/23  Yes Tolia, Sunit, DO  Cholecalciferol  (VITAMIN D -3) 25 MCG (1000 UT) CAPS Take 1 capsule by mouth daily.   Yes [provider]  dexamethasone  (DECADRON ) 4 MG tablet Take 2 tabs at the night before and 2 tab the morning of chemotherapy, every 3 weeks, by mouth x 6 cycles 04/15/24  Yes Gorsuch, Ni, MD  escitalopram  (LEXAPRO ) 20 MG tablet Take 20 mg by mouth daily.   Yes [provider]  ezetimibe  (ZETIA ) 10 MG tablet Take 10 mg by mouth at bedtime. 01/13/19  Yes [provider]  hydrALAZINE (APRESOLINE) 25 MG tablet Take 25 mg by mouth daily.   Yes [provider]  hydrochlorothiazide  (HYDRODIURIL ) 25 MG tablet Take 1 tablet (25 mg total) by mouth daily. 11/24/19  Yes Therisa Arabia, PA-C   hydrOXYzine (ATARAX) 25 MG tablet Take 25 mg by mouth in the morning and at bedtime.   Yes [provider]  irbesartan  (AVAPRO ) 300 MG tablet Take 1 tablet (300 mg total) by mouth at bedtime. Patient taking differently: Take 300 mg by mouth daily. 08/23/17  Yes Elvie Begun M, PA-C  metFORMIN  (GLUCOPHAGE ) 500 MG tablet Take 1 tablet (500 mg total) by mouth daily with breakfast. 10/30/19  Yes Therisa Arabia, PA-C  polyethylene glycol (MIRALAX  / GLYCOLAX ) 17 g packet Take 17 g by mouth daily.   Yes [provider]  verapamil  (VERELAN  PM) 240 MG 24 hr capsule Take 240 mg by mouth at bedtime.   Yes [provider]  aspirin  EC 81 MG tablet Take 81 mg by mouth daily. Swallow whole.    [provider]  clotrimazole-betamethasone  (LOTRISONE) cream Apply 1 Application topically 2 (two) times daily.    [provider]  Glycerin-Polysorbate 80 (REFRESH DRY EYE THERAPY OP) Place 1 drop into both eyes in the morning and at bedtime.    [provider]  HYDROcodone -acetaminophen  (NORCO/VICODIN) 5-325 MG tablet Take 1 tablet by mouth every 6 (six) hours as needed for severe pain (pain score 7-10). For AFTER surgery only, do not take and drive. Monitor tylenol  intake. 04/04/24   Cross, Melissa D, NP  lidocaine -prilocaine  (EMLA ) cream Apply to affected area once 04/15/24   Gorsuch, Ni, MD  nystatin powder Apply 1 Application topically 2 (two) times daily.    [provider]  ondansetron  (ZOFRAN ) 8 MG tablet Take 1 tablet (8 mg total) by mouth every 8 (eight) hours as needed for nausea or vomiting. Start on the third day after carboplatin. 04/15/24   Lonn Hicks, MD  prochlorperazine  (COMPAZINE ) 10 MG tablet Take 1 tablet (10 mg total) by mouth every 6 (six) hours as needed for nausea or vomiting. 04/15/24   Lonn Hicks, MD     Family History  Problem Relation Age of Onset   Stroke Mother    Diabetes Mother    Hypertension Mother    Hyperlipidemia Mother     Obesity Mother    Heart disease Father    Clotting disorder Father    Stroke Father    Hypertension Father    Heart Problems Father    Breast cancer Sister 80   Colon cancer Neg Hx    Neuropathy Neg  Hx     Social History   Socioeconomic History   Marital status: Married    Spouse name: Alm   Number of children: 2   Years of education: 12   Highest education level: Not on file  Occupational History   Occupation: Retired Network engineer  Tobacco Use   Smoking status: Never   Smokeless tobacco: Never  Vaping Use   Vaping status: Never Used  Substance and Sexual Activity   Alcohol use: No   Drug use: Never   Sexual activity: Not Currently    Partners: Male    Birth control/protection: Post-menopausal  Other Topics Concern   Not on file  Social History Narrative   Lives with husband   Caffeine use: 2-3 servings per day   Social Drivers of Health   Financial Resource Strain: Not on file  Food Insecurity: Food Insecurity Present (04/17/2024)   Hunger Vital Sign    Worried About Running Out of Food in the Last Year: Sometimes true    Ran Out of Food in the Last Year: Sometimes true  Transportation Needs: No Transportation Needs (04/17/2024)   PRAPARE - Administrator, Civil Service (Medical): No    Lack of Transportation (Non-Medical): No  Physical Activity: Not on file  Stress: Not on file  Social Connections: Patient Declined (04/03/2024)   Social Connection and Isolation Panel    Frequency of Communication with Friends and Family: Patient declined    Frequency of Social Gatherings with Friends and Family: Patient declined    Attends Religious Services: Patient declined    Database administrator or Organizations: Patient declined    Attends Banker Meetings: Patient declined    Marital Status: Patient declined     Review of Systems: A 12 point ROS discussed and pertinent positives are indicated in the HPI above.  All other systems are  negative.  Review of Systems  Constitutional:  Negative for fatigue and fever.  Respiratory:  Negative for cough and shortness of breath.   Cardiovascular:  Negative for chest pain.  Gastrointestinal:  Negative for abdominal pain, nausea and vomiting.  Musculoskeletal:  Negative for back pain.  Psychiatric/Behavioral:  Negative for behavioral problems and confusion.     Vital Signs: BP (!) 158/73   Pulse 74   Temp 98 F (36.7 C) (Oral)   Resp 16   Ht 5' 6 (1.676 m)   Wt 271 lb (122.9 kg)   SpO2 100%   BMI 43.74 kg/m   Physical Exam Vitals and nursing note reviewed.  Constitutional:      General: She is not in acute distress.    Appearance: Normal appearance. She is not ill-appearing.  HENT:     Mouth/Throat:     Mouth: Mucous membranes are moist.     Pharynx: Oropharynx is clear.  Cardiovascular:     Rate and Rhythm: Normal rate and regular rhythm.  Pulmonary:     Effort: Pulmonary effort is normal.     Breath sounds: Normal breath sounds.  Abdominal:     General: Abdomen is flat.     Palpations: Abdomen is soft.  Musculoskeletal:     Cervical back: Normal range of motion.  Skin:    General: Skin is warm and dry.  Neurological:     General: No focal deficit present.     Mental Status: She is alert and oriented to person, place, and time. Mental status is at baseline.  Psychiatric:  Mood and Affect: Mood normal.        Behavior: Behavior normal.        Thought Content: Thought content normal.        Judgment: Judgment normal.      MD Evaluation Airway: WNL Heart: WNL Abdomen: WNL Chest/ Lungs: WNL ASA  Classification: 3 Mallampati/Airway Score: Two   Imaging: VAS US  LOWER EXTREMITY VENOUS (DVT) Result Date: 04/06/2024  Lower Venous DVT Study Patient Name:  YANCI BACHTELL  Date of Exam:   04/04/2024 Medical Rec #: [id number]         Accession #:    [id number] Date of Birth: 01-11-1946         Patient Gender: F Patient Age:   36 years Exam Location:   [facility] Procedure:      VAS US  LOWER EXTREMITY VENOUS (DVT) Referring Phys: MELISSA CROSS --------------------------------------------------------------------------------  Indications: Chronic left lower extremity edema.  Risk Factors: Status post hysterectomy 04/03/24. Limitations: Body habitus and poor ultrasound/tissue interface. Comparison Study: Prior negative left LEV done 12/11/13 Performing Technologist: Alberta Lis RVS  Examination Guidelines: A complete evaluation includes B-mode imaging, spectral Doppler, color Doppler, and power Doppler as needed of all accessible portions of each vessel. Bilateral testing is considered an integral part of a complete examination. Limited examinations for reoccurring indications may be performed as noted. The reflux portion of the exam is performed with the patient in reverse Trendelenburg.  +-----+---------------+---------+-----------+----------+--------------+ RIGHTCompressibilityPhasicitySpontaneityPropertiesThrombus Aging +-----+---------------+---------+-----------+----------+--------------+ CFV  Full           Yes      Yes                                 +-----+---------------+---------+-----------+----------+--------------+ SFJ  Full                                                        +-----+---------------+---------+-----------+----------+--------------+   +---------+---------------+---------+-----------+----------+-------------------+ LEFT     CompressibilityPhasicitySpontaneityPropertiesThrombus Aging      +---------+---------------+---------+-----------+----------+-------------------+ CFV      Full           Yes      No                                       +---------+---------------+---------+-----------+----------+-------------------+ SFJ      Full                                                             +---------+---------------+---------+-----------+----------+-------------------+ FV Prox  Full            Yes      No                                       +---------+---------------+---------+-----------+----------+-------------------+ FV Mid   Full                                                             +---------+---------------+---------+-----------+----------+-------------------+  FV DistalFull           Yes      No                                       +---------+---------------+---------+-----------+----------+-------------------+ PFV      Full           Yes      No                                       +---------+---------------+---------+-----------+----------+-------------------+ POP                     Yes      No                   patent by color and                                                       Doppler             +---------+---------------+---------+-----------+----------+-------------------+ PTV      Full                                                             +---------+---------------+---------+-----------+----------+-------------------+ PERO     Full                                                             +---------+---------------+---------+-----------+----------+-------------------+ Gastroc  Full                                                             +---------+---------------+---------+-----------+----------+-------------------+     Summary: RIGHT: - No evidence of common femoral vein obstruction.   LEFT: - There is no evidence of deep vein thrombosis in the lower extremity.  - No cystic structure found in the popliteal fossa.  *See table(s) above for measurements and observations. Electronically signed by Gaile New MD on 04/06/2024 at 8:22:47 PM.    Final     Labs:  CBC: Recent Labs    10/27/23 1819 02/28/24 1200 03/28/24 1014 04/04/24 0412  WBC 8.1 8.0 7.7 10.7*  HGB 11.2* 10.9* 10.8* 10.6*  HCT 35.5* 34.0* 34.6* 35.7*  PLT 309 274 275 292    COAGS: No results for input(s): INR,  APTT in the last 8760 hours.  BMP: Recent Labs    10/27/23 1819 02/28/24 1200 03/28/24 1014 04/04/24 0412  NA 136 141 141 138  K 4.2 3.9 4.5 4.5  CL 105 111 108 104  CO2 24 21* 23 22  GLUCOSE 94 95 99 127*  BUN 13 13 12 12   CALCIUM  10.2 10.1 10.5* 10.4*  CREATININE 1.30* 1.21* 1.28* 1.27*  GFRNONAA 42* 45* 42* 43*    LIVER FUNCTION TESTS: Recent Labs    10/27/23 1819 03/28/24 1014  BILITOT 0.6 0.6  AST 22 17  ALT 14 11  ALKPHOS 78 93  PROT 7.5 7.5  ALBUMIN  3.7 3.9    TUMOR MARKERS: No results for input(s): AFPTM, CEA, CA199, CHROMGRNA in the last 8760 hours.  Assessment and Plan: Patient with past medical history of HTN, DM, prior breast cancer s/p treatment presents with complaint of newly diagnosed endometrial cancer.  IR consulted for Port placement at the request of Dr. Lonn. Case reviewed by Dr. Philip who approves patient for procedure.  Patient presents today in their usual state of health.  She has been NPO and is not currently on blood thinners.   Risks and benefits of image guided port-a-catheter placement was discussed with the patient including, but not limited to bleeding, infection, pneumothorax, or fibrin sheath development and need for additional procedures.  All of the patient's questions were answered, patient is agreeable to proceed. Consent signed and in chart.    Thank you for this interesting consult.  I greatly enjoyed meeting Karna Abed and look forward to participating in their care.  A copy of this report was sent to the requesting provider on this date.  Electronically Signed: Maddix Kliewer Sue-Ellen Ravon Mcilhenny, PA 04/28/2024, 11:29 AM   I spent a total of  30 Minutes   in face to face in clinical consultation, greater than 50% of which was counseling/coordinating care for endometrial cancer.

## 2024-04-28 NOTE — Progress Notes (Signed)
 Patient noted to have pulsing where Right chest port was attempted, PA called and made aware, came to bedside. PA states the site is doing well and continue to monitor. Patient and husband given discharge education state they have no further questions at this time. Patient blood pressures running lower than previously before, PA made aware, blood pressures in chart given to PA but patient is asymptomatic. Patient to be held an extra 30 minute and recheck BP. BP rechecked in 30 min., patient able to sit on side on bed and stand up with any symptoms. PA called and made aware of blood pressures and patient status, states it is safe for her to discharge at this time.

## 2024-04-28 NOTE — Procedures (Signed)
 Interventional Radiology Procedure:   Indications: Endometrial cancer  Procedure: Port placement  Findings: Left jugular port, tip at SVC/RA junction.    Complications: None     EBL: Minimal, less than 10 ml  Plan: Discharge in one hour.  Keep port site and incisions dry for at least 24 hours.     Cyan Clippinger R. Philip, MD  Pager: [phone]

## 2024-04-30 ENCOUNTER — Telehealth: Payer: Self-pay

## 2024-04-30 ENCOUNTER — Telehealth: Payer: Self-pay | Admitting: *Deleted

## 2024-04-30 NOTE — Telephone Encounter (Signed)
 Pt called stating that she had taken off bandage that covered port due to itching and replaced it with a clean dry bandage that is also causing itching. Advised pt to take a rubbing alcohol and apply a small amount to cotton pad and apply around area where it is itching put not on or at port insertion site to help soothe itching. Advised get are wet or put derma bond adhesive. Pt stated that it was only red where tape border was but no bleeding or drainage around or at incision of port. Advised pt to leave bandage off of area due to increased irritation with adhesive from dressing and bandage. Pt verbalized understanding.

## 2024-04-30 NOTE — Telephone Encounter (Signed)
 Copied from CRM #[id number]. Topic: Clinical - Order For Equipment >> Apr 29, 2024  2:50 PM Joesph PARAS wrote: Reason for CRM: Patient is calling to state that she has not heard from DME. Please update patient regarding this. States she got a call from a number about something supplies but has no idea if this number is related or not and attempting to c/b leads to a dead line.   - Please advise patient on an update for DME status.

## 2024-05-01 ENCOUNTER — Inpatient Hospital Stay

## 2024-05-01 ENCOUNTER — Encounter: Payer: Self-pay | Admitting: Gynecologic Oncology

## 2024-05-01 ENCOUNTER — Encounter: Payer: Self-pay | Admitting: Oncology

## 2024-05-01 ENCOUNTER — Inpatient Hospital Stay: Attending: Gynecologic Oncology | Admitting: Gynecologic Oncology

## 2024-05-01 VITALS — BP 144/88 | HR 69 | Temp 97.9°F | Resp 20 | Wt 271.0 lb

## 2024-05-01 DIAGNOSIS — C541 Malignant neoplasm of endometrium: Secondary | ICD-10-CM

## 2024-05-01 DIAGNOSIS — I129 Hypertensive chronic kidney disease with stage 1 through stage 4 chronic kidney disease, or unspecified chronic kidney disease: Secondary | ICD-10-CM | POA: Diagnosis not present

## 2024-05-01 DIAGNOSIS — Z79899 Other long term (current) drug therapy: Secondary | ICD-10-CM | POA: Diagnosis not present

## 2024-05-01 DIAGNOSIS — Z5111 Encounter for antineoplastic chemotherapy: Secondary | ICD-10-CM | POA: Diagnosis present

## 2024-05-01 DIAGNOSIS — D631 Anemia in chronic kidney disease: Secondary | ICD-10-CM | POA: Insufficient documentation

## 2024-05-01 DIAGNOSIS — Z90710 Acquired absence of both cervix and uterus: Secondary | ICD-10-CM

## 2024-05-01 DIAGNOSIS — N1831 Chronic kidney disease, stage 3a: Secondary | ICD-10-CM | POA: Diagnosis not present

## 2024-05-01 DIAGNOSIS — D539 Nutritional anemia, unspecified: Secondary | ICD-10-CM

## 2024-05-01 DIAGNOSIS — Z7189 Other specified counseling: Secondary | ICD-10-CM

## 2024-05-01 DIAGNOSIS — C7982 Secondary malignant neoplasm of genital organs: Secondary | ICD-10-CM

## 2024-05-01 DIAGNOSIS — C7962 Secondary malignant neoplasm of left ovary: Secondary | ICD-10-CM

## 2024-05-01 DIAGNOSIS — Z90722 Acquired absence of ovaries, bilateral: Secondary | ICD-10-CM

## 2024-05-01 DIAGNOSIS — Z9079 Acquired absence of other genital organ(s): Secondary | ICD-10-CM

## 2024-05-01 LAB — CBC WITH DIFFERENTIAL (CANCER CENTER ONLY)
Abs Immature Granulocytes: 0.04 K/uL (ref 0.00–0.07)
Basophils Absolute: 0 K/uL (ref 0.0–0.1)
Basophils Relative: 1 %
Eosinophils Absolute: 0.1 K/uL (ref 0.0–0.5)
Eosinophils Relative: 1 %
HCT: 34.4 % — ABNORMAL LOW (ref 36.0–46.0)
Hemoglobin: 11.1 g/dL — ABNORMAL LOW (ref 12.0–15.0)
Immature Granulocytes: 1 %
Lymphocytes Relative: 32 %
Lymphs Abs: 2.5 K/uL (ref 0.7–4.0)
MCH: 24.9 pg — ABNORMAL LOW (ref 26.0–34.0)
MCHC: 32.3 g/dL (ref 30.0–36.0)
MCV: 77.3 fL — ABNORMAL LOW (ref 80.0–100.0)
Monocytes Absolute: 0.7 K/uL (ref 0.1–1.0)
Monocytes Relative: 9 %
Neutro Abs: 4.5 K/uL (ref 1.7–7.7)
Neutrophils Relative %: 56 %
Platelet Count: 261 K/uL (ref 150–400)
RBC: 4.45 MIL/uL (ref 3.87–5.11)
RDW: 17.2 % — ABNORMAL HIGH (ref 11.5–15.5)
WBC Count: 7.8 K/uL (ref 4.0–10.5)
nRBC: 0 % (ref 0.0–0.2)

## 2024-05-01 LAB — IRON AND IRON BINDING CAPACITY (CC-WL,HP ONLY)
Iron: 61 ug/dL (ref 28–170)
Saturation Ratios: 17 % (ref 10.4–31.8)
TIBC: 361 ug/dL (ref 250–450)
UIBC: 300 ug/dL (ref 148–442)

## 2024-05-01 LAB — FERRITIN: Ferritin: 232 ng/mL (ref 11–307)

## 2024-05-01 LAB — CMP (CANCER CENTER ONLY)
ALT: 10 U/L (ref 0–44)
AST: 10 U/L — ABNORMAL LOW (ref 15–41)
Albumin: 4 g/dL (ref 3.5–5.0)
Alkaline Phosphatase: 85 U/L (ref 38–126)
Anion gap: 4 — ABNORMAL LOW (ref 5–15)
BUN: 19 mg/dL (ref 8–23)
CO2: 30 mmol/L (ref 22–32)
Calcium: 10.6 mg/dL — ABNORMAL HIGH (ref 8.9–10.3)
Chloride: 109 mmol/L (ref 98–111)
Creatinine: 1.28 mg/dL — ABNORMAL HIGH (ref 0.44–1.00)
GFR, Estimated: 42 mL/min — ABNORMAL LOW (ref >60)
Glucose, Bld: 92 mg/dL (ref 70–99)
Potassium: 3.7 mmol/L (ref 3.5–5.1)
Sodium: 143 mmol/L (ref 135–145)
Total Bilirubin: 0.5 mg/dL (ref 0.0–1.2)
Total Protein: 7.8 g/dL (ref 6.5–8.1)

## 2024-05-01 LAB — VITAMIN B12: Vitamin B-12: 332 pg/mL (ref 180–914)

## 2024-05-01 LAB — TSH: TSH: 2.31 u[IU]/mL (ref 0.350–4.500)

## 2024-05-01 MED FILL — Fosaprepitant Dimeglumine For IV Infusion 150 MG (Base Eq): INTRAVENOUS | Qty: 5 | Status: AC

## 2024-05-01 NOTE — Progress Notes (Signed)
 Sent order requisition to [employer] on accession [id number] per Dr. Viktoria.

## 2024-05-01 NOTE — Telephone Encounter (Signed)
 Spoke with Brad from [employer]. He stated they contacted [name] on 9/30 @16 :01 to set up an appointment for her CPAP and supplies, scheduled in their office on 10/6 @10  AM . The call the patient was inquiring about was from [facility], notifying her that authorization was approved and she cleared to receive her CPAP and supplies. Reached out to patient to provide an update. No answer, left detailed voicemail with all updated information and requested a call back if she has any further questions. Nothing further needed at this time.

## 2024-05-01 NOTE — Progress Notes (Signed)
 Gynecologic Oncology Return Clinic Visit  05/01/24  Reason for Visit: follow-up, treatment planning  Treatment History: Oncology History Overview Note  MMR deficient, p53 WT, Her2/neu 0%   Endometrial cancer (HCC)  09/26/2023 Initial Diagnosis   Incidental finding on CT scan in February showed thickened endometrium.  Her scan also showed cholelithiasis, diverticulosis, and nonobstructing right lower pole kidney stone.  At that time, patient denied any postmenopausal bleeding.  She was having some right lower quadrant pain radiating down to her hip. Pelvic ultrasound on 09/26/2023 showed uterus measuring 9.7 x 5.5 x 6.3 with an endometrial lining thickened at 33.4 mm.  Adnexa not identified but no cystic or solid masses noted. Hysteroscopy with endometrial sampling was performed on 7/31 with findings of blood and clots within the endometrial cavity limiting visualization.  Copious seedlike endometrium and clot were removed with sharp curettage. Final pathology from biopsy showed FIGO grade 2 endometrioid endometrial adenocarcinoma.     02/28/2024 Surgery   Pre-Operative Diagnosis: 1) thickened endometrium Postoperative Diagnosis: 1) thickened endometrium  Procedure: Hysteroscopy, dilation and curettage Surgeon: Dr. Marjorie Gull  Operative Findings: Cervical descensus to the level of the vaginal introitus.  Unable to adequately visualize the endometrial cavity due to the presence of blood and clots.  Copious seedlike endometrium and clot were removed with sharp curettage.  With curettage the posterior wall of the endometrial cavity had a cobblestone feeling.  Endometrial cavity was not completely evacuated of tissue.  Fluid deficit 85 cc Specimen: Endometrial curettings   02/28/2024 Pathology Results   SURGICAL PATHOLOGY  CASE: [id number]  PATIENT: [name]  Surgical Pathology Report      Clinical History: thickened endometrium (cm)      FINAL MICROSCOPIC DIAGNOSIS:   A.  ENDOMETRIUM, CURETTAGE:  Endometrial adenocarcinoma, endometrioid type with extensive mucinous features, FIGO grade 2 (see comment).   Comment: Sections of the endometrial curettings demonstrate an adenocarcinoma with mixed morphology, including some areas of typical endometrioid morphology, large areas with intracellular mucin, and focal areas with cytoplasmic clearing.  Immunohistochemical stains are  performed.  Vimentin shows a mixed pattern with some positive and some negative areas.  P16 and CEAm are patchy positive.  CD10 highlights stroma, and napsin is negative.  These results are most consistent with endometrioid adenocarcinoma with mucinous features.    04/03/2024 Initial Diagnosis   Endometrial cancer (HCC)   04/03/2024 Surgery   Pre-operative Diagnosis: endometrial cancer grade 2, incisional hernia   Post-operative Diagnosis: same, significant retroperitoneal adiposity, abdominal adhesions   Operation: Robotic-assisted laparoscopic total hysterectomy with bilateral salpingo-oophorectomy, SLN injection and biopsy, lysis of adhesions, resection of left adnexal mass, cystoscopy Morbid obesity requiring additional OR personnel for positioning and retraction. Obesity made retroperitoneal visualization limited and increased the complexity of the case and necessitated additional instrumentation for retraction. Obesity related complexity increased the duration of the procedure by 90 minutes.    Surgeon: Viktoria Crank MD    Operative Findings:  : On exam under anesthesia, somewhat mobile uterus.  On intra-abdominal entry, normal upper abdominal survey.  Large lower abdominal hernia noted that appears to be a defect from a transverse incision with some omentum and small bowel adherent to the anterior abdominal wall.  There are also loops of the terminal ileum and what appears to be the cecum that are tethered to the right abdominal wall.  Otherwise, normal-appearing small bowel.  There are some  physiologic adhesions of the epiploica to the left abdomen and at the level of the left pelvic brim.  The uterus itself is approximately 8-10 cm and somewhat bulbous.  Normal-appearing right fallopian tube and ovary.  Left fallopian tube and ovary normal in appearance although there is an approximately 4 cm mass that appears to be emanating from the left ovary versus in close proximity to it that is adherent to the lateral aspect of the rectal mesentery along the deep left pelvic sidewall.  It is unclear whether this is from a prior infection versus represents site of metastatic disease.  This tissue was quite friable but was ultimately able to be removed with resection of the peritoneum and very superficial part of the lateral rectal mesentery and portion of the right pelvic sidewall peritoneum.  Significant adiposity of the retroperitoneum and deep pelvis noted.  Hemosiderin deposits on the peritoneum of the pelvis.  Mapping successful to a right obturator sentinel lymph node and left external iliac sentinel lymph node.  No enlarged lymph nodes.  No ascites. On cystoscopy, bladder dome intact, efflux seen from bilateral ureteral orifices.    04/03/2024 Pathology Results   SURGICAL PATHOLOGY   CASE: [id number]  PATIENT: [name]  Surgical Pathology Report   FINAL MICROSCOPIC DIAGNOSIS:   A. SENTINEL LYMPH NODE, LEFT EXTERNAL ILIAC, RESECTION:  -  1 benign fibrotic lymph node with dystrophic calcifications, negative for malignancy (0/1).   B. ADNEXAL MASS, LEFT, RESECTION:  -  Metastatic endometrioid carcinoma with squamous metaplasia and areas of geographic necrosis.  -  No definitive residual normal ovary is identified.  -  Tumor is present on cauterized cut surface.   C. UTERUS, CERVIX, FALLOPIAN TUBE, OVARY, BILATERAL, HYSTERECTOMY: -  Endometrioid carcinoma with mucinous features, FIGO grade 2 of 3 involving 65% of the myometrium. -  Unremarkable cervix and lower uterine  segment. -  Left fallopian tube and ovary with focal surface involvement by endometrioid carcinoma. -  Unremarkable right fallopian tube and ovary.  D. SENTINEL LYMPH NODE, RIGHT OBTURATOR, RESECTION: -  1 benign fibrotic lymph node with dystrophic calcifications, negative for malignancy (0/1).  ONCOLOGY TABLE:  UTERUS, CARCINOMA OR CARCINOSARCOMA: Resection  Procedure: Robot-assisted total hysterectomy with bilateral salpingo-oophorectomy and sentinel lymph nodes. Histologic Type: Endometrioid with mucinous features (refer to diagnostic biopsy ([facility] [phone]) Histologic Grade: FIGO grade 2 Myometrial Invasion:      Depth of Myometrial Invasion (mm): 13      Myometrial Thickness (mm): 20      Percentage of Myometrial Invasion: 65% Uterine Serosa Involvement: Not identified Cervical stromal Involvement: Not identified Extent of involvement of other tissue/organs: Left adnexal mass (part B) with tumor identified on serosal surface of left fallopian tube and ovary (blocks C21 and C26) Peritoneal/Ascitic Fluid: Not received Lymphovascular Invasion: Not identified Regional Lymph Nodes:      Pelvic Lymph Nodes Examined:          2 Sentinel          0 non-sentinel          2 total      Pelvic Lymph Nodes with Metastasis: 0          Macrometastasis: (>2.0 mm): N/A          Micrometastasis: (>0.2 mm and < 2.0 mm): N/A          Isolated Tumor Cells (<0.2 mm): N/A          Laterality of Lymph Node with Tumor: N/A          Extracapsular Extension: N/A      Para-aortic Lymph Nodes Examined:  0 Sentinel          0 non-sentinel          0 total      Para-aortic Lymph Nodes with Metastasis: N/A          Macrometastasis: (>2.0 mm): N/A          Micrometastasis:  (>0.2 mm and < 2.0 mm): N/A          Isolated Tumor Cells (<0.2 mm): N/A          Laterality of Lymph Node with Tumor: N/A          Extracapsular Extension: N/A Distant Metastasis:      Distant Site(s) Involved:  N/A Pathologic Stage Classification (pTNM, AJCC 8th Edition): pT3a, pN0    04/11/2024 Cancer Staging   Staging form: Corpus Uteri - Carcinoma and Carcinosarcoma, AJCC 8th Edition and FIGO 2023 - Pathologic: Stage III (pT3, pN0, cM0) - Signed by Lonn Hicks, MD on 04/11/2024 Stage prefix: Initial diagnosis   04/28/2024 Procedure   Placement of a left jugular subcutaneous power-injectable port device. Catheter tip at the superior cavoatrial junction.   05/02/2024 -  Chemotherapy   Patient is on Treatment Plan : UTERINE Pembrolizumab (200), Paclitaxel (80), Carboplatin (5) q21d x 6 cycles / Pembrolizumab (400) q42d       Interval History: Doing well.  Denies any vaginal bleeding.  Continues to use MiraLAX  to help with regular bowel function.  Has some soreness related to her incisions.  Denies any of the right lower quadrant pain that she was having prior to surgery.  Past Medical/Surgical History: Past Medical History:  Diagnosis Date   Anemia    Anxiety    Cancer (HCC)    breast treated with radiation  endometrial   Chronic fatigue syndrome    Chronic low back pain    followed by dr jinny. mavis   CKD (chronic kidney disease), stage III (HCC)    followed by pcp   Constipation 02/20/2024   02-21-2024  pt stated viist w/ PCP due to constipation given stool softner and abdominal xray done   Coronary artery calcification 05/2022   cardiologist--- dr s. michele;   CT 11/ 2023 ;  CCTA 09-04-2022  calcium  score=36.9 involving LAD w/ mild nonob dz;  NUC 08-14-2022  LR w/ mild perfusion defect reversible, nuclear ef 60%   DDD (degenerative disc disease), lumbar    Depression    Diverticulosis of colon    DOE (dyspnea on exertion)    02-21-2024  sob w/ stairs/ long distance walking/  okay with household chores ,  per cardiologist note 09/2023  chronic DOE stable   Edema of both lower extremities    02-21-2024 pt stated left > right   Flat foot, acquired    GERD (gastroesophageal reflux  disease)    History of adenomatous polyp of colon    GI-- dr shila   History of gout    History of kidney stones    History of left breast cancer 2013   oncologist--- dr layla (lov 09-06-2017 released w/ no recurrence) ;  08-30-2011  s/p left lumpectomy w/ node dissection;  Stage IAG1, IDC, ER/PR+, nodes negative;  completed IMRT 11-24-2011 ,  completed tamoxifen  02/ 2019   Hyperlipidemia    Hypertension    Lumbar stenosis with neurogenic claudication    per pt left leg   OA (osteoarthritis)    OSA on CPAP 07/2004   followed by [facility]--- dr jude;   sleep  study in epic 08-05-2004 severe osa, (02-21-2024 pt stated uses cpap nightly)   Peripheral neuropathy    Personal history of radiation therapy 09/2011   left breast/ chest wall   10-29-2011  to  11-24-2011  6100 cGy   Pneumonia    Rash    02-21-2024  pt stated has rash under abd fold and between thighs   Sickle cell trait    Thickened endometrium    Tinnitus of both ears    Type 2 diabetes mellitus (HCC)    followed by pcp:   (02-21-2024  pt stated checks  blood sugar 2 times montly , not fasting)   Wears partial dentures    upper    Past Surgical History:  Procedure Laterality Date   APPENDECTOMY  1978   BIOPSY  10/06/2021   Procedure: BIOPSY;  Surgeon: Shila Gustav GAILS, MD;  Location: [facility];  Service: Endoscopy;;   BLEPHAROPLASTY Right    age 12s---  right upper eyelid   BREAST LUMPECTOMY WITH NEEDLE LOCALIZATION Right 02/06/2013   Procedure: RIGHT BREAST LUMPECTOMY WITH NEEDLE LOCALIZATION;  Surgeon: Vicenta DELENA Poli, MD;  Location: [facility];  Service: General;  Laterality: Right;   BREAST LUMPECTOMY WITH NEEDLE LOCALIZATION AND AXILLARY SENTINEL LYMPH NODE BX Left 08/30/2011   @[facility]  by dr d. poli;   CATARACT EXTRACTION W/ INTRAOCULAR LENS IMPLANT Bilateral 2018   COLONOSCOPY WITH PROPOFOL  N/A 10/27/2015   Procedure: COLONOSCOPY WITH PROPOFOL ;  Surgeon: Gustav Shila GAILS, MD;  Location: [facility];   Service: Endoscopy;  Laterality: N/A;   COLONOSCOPY WITH PROPOFOL  N/A 10/06/2021   Procedure: COLONOSCOPY WITH PROPOFOL ;  Surgeon: Shila Gustav GAILS, MD;  Location: [facility];  Service: Endoscopy;  Laterality: N/A;   DILATATION & CURRETTAGE/HYSTEROSCOPY WITH RESECTOCOPE N/A 02/28/2024   Procedure: DILATATION & CURETTAGE/HYSTEROSCOPY;  Surgeon: Okey Leader, MD;  Location: [facility] OR;  Service: Gynecology;  Laterality: N/A;   HARDWARE REMOVAL Left 04/20/2015   Procedure: HARDWARE REMOVAL LEFT LUMBAR FIVE SCREW;  Surgeon: Darina Boehringer, MD;  Location: [facility] OR;  Service: Neurosurgery;  Laterality: Left;   HYSTEROSCOPY WITH D & C  10/04/2005   @[facility]  by dr a. okey;  polypectomy   INCISIONAL HERNIA REPAIR N/A 04/03/2024   Procedure: LYSIS OF ADHESIONS;  Surgeon: Lyndel Deward PARAS, MD;  Location: [facility];  Service: General;  Laterality: N/A;   INJECTION, FOR SENTINEL LYMPH NODE IDENTIFICATION N/A 04/03/2024   Procedure: INJECTION, FOR SENTINEL LYMPH NODE IDENTIFICATION;  Surgeon: [name], MD;  Location: [facility];  Service: Gynecology;  Laterality: N/A;   IR IMAGING GUIDED PORT INSERTION  04/28/2024   LYMPH NODE BIOPSY N/A 04/03/2024   Procedure: LYMPH NODE BIOPSY;  Surgeon: [name], MD;  Location: [facility];  Service: Gynecology;  Laterality: N/A;   POLYPECTOMY  10/06/2021   Procedure: POLYPECTOMY;  Surgeon: Shila Gustav GAILS, MD;  Location: [facility];  Service: Endoscopy;;   POSTERIOR FUSION LUMBAR SPINE  04/19/2020   @[facility]  by dr saunders. kritzer:  Laminectomy / Decompression  L2--3 /  interbody fusion w/ segmental instrumentation L2--L5   POSTERIOR LUMBAR FUSION 2 LEVEL  12/29/2014   @[facility]  by dr saunders. kritzer;   L3--4  and L4--5   ROBOTIC ASSISTED LAPAROSCOPIC LYSIS OF ADHESION  04/03/2024   Procedure: LYSIS, ADHESIONS, ROBOT-ASSISTED, LAPAROSCOPIC;  Surgeon: [name], MD;  Location: [facility];  Service: Gynecology;;   ROBOTIC ASSISTED TOTAL HYSTERECTOMY WITH BILATERAL SALPINGO  OOPHERECTOMY Bilateral 04/03/2024   Procedure: HYSTERECTOMY, TOTAL, ROBOT-ASSISTED, LAPAROSCOPIC, WITH BILATERAL SALPINGO-OOPHORECTOMY;  Surgeon: [name], MD;  Location: [facility];  Service: Gynecology;  Laterality: Bilateral;  WITH POSSIBLE LAPAROTOMY   THYROID  CYST EXCISION  1994   benign   TOE SURGERY Left    TOTAL KNEE ARTHROPLASTY Left 08/19/2004   @[facility]  by dr r. gioffre   TOTAL KNEE ARTHROPLASTY Right 05/24/2005   @[facility]  by dr r. gioffre   TRANSPHENOIDAL PITUITARY RESECTION  01/25/2010   @[facility]  by dr r. kritzer/ dr c. 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    Family History  Problem Relation Age of Onset   Stroke Mother    Diabetes Mother    Hypertension Mother    Hyperlipidemia Mother    Obesity Mother    Heart disease Father    Clotting disorder Father    Stroke Father    Hypertension Father    Heart Problems Father    Breast cancer Sister 10   Colon cancer Neg Hx    Neuropathy Neg Hx     Social History   Socioeconomic History   Marital status: Married    Spouse name: Alm   Number of children: 2   Years of education: 12   Highest education level: Not on file  Occupational History   Occupation: Retired Network engineer  Tobacco Use   Smoking status: Never   Smokeless tobacco: Never  Vaping Use   Vaping status: Never Used  Substance and Sexual Activity   Alcohol use: No   Drug use: Never   Sexual activity: Not Currently    Partners: Male    Birth control/protection: Post-menopausal  Other Topics Concern   Not on file  Social History Narrative   Lives with husband   Caffeine use: 2-3 servings per day   Social Drivers of Health   Financial Resource Strain: Not on file  Food Insecurity: Food Insecurity Present (04/17/2024)   Hunger Vital Sign    Worried About Running Out of Food in the Last Year: Sometimes true    Ran Out of Food in the Last Year: Sometimes true  Transportation Needs: No Transportation Needs  (04/17/2024)   PRAPARE - Administrator, Civil Service (Medical): No    Lack of Transportation (Non-Medical): No  Physical Activity: Not on file  Stress: Not on file  Social Connections: Patient Declined (04/03/2024)   Social Connection and Isolation Panel    Frequency of Communication with Friends and Family: Patient declined    Frequency of Social Gatherings with Friends and Family: Patient declined    Attends Religious Services: Patient declined    Database administrator or Organizations: Patient declined    Attends Banker Meetings: Patient declined    Marital Status: Patient declined    Current Medications:  Current Outpatient Medications:    acetaminophen  (TYLENOL ) 500 MG tablet, Take 1,000 mg by mouth daily as needed for headache, mild pain (pain score 1-3) or moderate pain (pain score 4-6)., Disp: , Rfl:    aspirin  EC 81 MG tablet, Take 81 mg by mouth daily. Swallow whole., Disp: , Rfl:    atorvastatin  (LIPITOR) 20 MG tablet, TAKE 1 TABLET(20 MG) BY MOUTH AT BEDTIME, Disp: 90 tablet, Rfl: 3   Cholecalciferol  (VITAMIN D -3) 25 MCG (1000 UT) CAPS, Take 1 capsule by mouth daily., Disp: , Rfl:    clotrimazole-betamethasone  (LOTRISONE) cream, Apply 1 Application topically 2 (two) times daily., Disp: , Rfl:    dexamethasone  (DECADRON ) 4 MG tablet, Take 2 tabs at the night  before and 2 tab the morning of chemotherapy, every 3 weeks, by mouth x 6 cycles, Disp: 24 tablet, Rfl: 6   escitalopram  (LEXAPRO ) 20 MG tablet, Take 20 mg by mouth daily., Disp: , Rfl:    ezetimibe  (ZETIA ) 10 MG tablet, Take 10 mg by mouth at bedtime., Disp: , Rfl:    Glycerin-Polysorbate 80 (REFRESH DRY EYE THERAPY OP), Place 1 drop into both eyes in the morning and at bedtime., Disp: , Rfl:    hydrALAZINE (APRESOLINE) 25 MG tablet, Take 25 mg by mouth daily., Disp: , Rfl:    hydrochlorothiazide  (HYDRODIURIL ) 25 MG tablet, Take 1 tablet (25 mg total) by mouth daily., Disp: 30 tablet, Rfl: 0    HYDROcodone -acetaminophen  (NORCO/VICODIN) 5-325 MG tablet, Take 1 tablet by mouth every 6 (six) hours as needed for severe pain (pain score 7-10). For AFTER surgery only, do not take and drive. Monitor tylenol  intake., Disp: 15 tablet, Rfl: 0   hydrOXYzine (ATARAX) 25 MG tablet, Take 25 mg by mouth in the morning and at bedtime., Disp: , Rfl:    irbesartan  (AVAPRO ) 300 MG tablet, Take 1 tablet (300 mg total) by mouth at bedtime. (Patient taking differently: Take 300 mg by mouth daily.), Disp: 30 tablet, Rfl: 0   lidocaine -prilocaine  (EMLA ) cream, Apply to affected area once, Disp: 30 g, Rfl: 3   metFORMIN  (GLUCOPHAGE ) 500 MG tablet, Take 1 tablet (500 mg total) by mouth daily with breakfast., Disp: 90 tablet, Rfl: 0   nystatin powder, Apply 1 Application topically 2 (two) times daily., Disp: , Rfl:    ondansetron  (ZOFRAN ) 8 MG tablet, Take 1 tablet (8 mg total) by mouth every 8 (eight) hours as needed for nausea or vomiting. Start on the third day after carboplatin., Disp: 30 tablet, Rfl: 1   polyethylene glycol (MIRALAX  / GLYCOLAX ) 17 g packet, Take 17 g by mouth daily., Disp: , Rfl:    prochlorperazine  (COMPAZINE ) 10 MG tablet, Take 1 tablet (10 mg total) by mouth every 6 (six) hours as needed for nausea or vomiting., Disp: 30 tablet, Rfl: 1   verapamil  (VERELAN  PM) 240 MG 24 hr capsule, Take 240 mg by mouth at bedtime., Disp: , Rfl:   Review of Systems: Denies appetite changes, fevers, chills, fatigue, unexplained weight changes. Denies hearing loss, neck lumps or masses, mouth sores, ringing in ears or voice changes. Denies cough or wheezing.  Denies shortness of breath. Denies chest pain or palpitations. Denies leg swelling. Denies abdominal distention, pain, blood in stools, constipation, diarrhea, nausea, vomiting, or early satiety. Denies pain with intercourse, dysuria, frequency, hematuria or incontinence. Denies hot flashes, pelvic pain, vaginal bleeding or vaginal discharge.   Denies  joint pain, back pain or muscle pain/cramps. Denies itching, rash, or wounds. Denies dizziness, headaches, numbness or seizures. Denies swollen lymph nodes or glands, denies easy bruising or bleeding. Denies anxiety, depression, confusion, or decreased concentration.  Physical Exam: BP (!) 190/86 (BP Location: Right Arm, Patient Position: Sitting)   Pulse 69   Temp 97.9 F (36.6 C) (Oral)   Resp 20   Wt 271 lb (122.9 kg)   SpO2 100%   BMI 43.74 kg/m  General: Alert, oriented, no acute distress. HEENT: Posterior oropharynx clear, sclera anicteric. Chest: Unlabored breathing on room air.  Port site clean, no significant erythema, Dermabond in place. Abdomen: Obese, soft, nontender.  Normoactive bowel sounds.  No masses or hepatosplenomegaly appreciated.  Well-healed incisions. Extremities: Grossly normal range of motion.  Warm, well perfused.  No edema bilaterally. GU:  Normal appearing external genitalia without erythema, excoriation, or lesions.  Speculum exam reveals cuff intact, suture visible.  Bimanual exam reveals cuff is intact no tenderness or fluctuance to palpation.    Laboratory & Radiologic Studies: None new  Assessment & Plan: [name] is a 78 y.o. woman with Stage IIIA1 grade 2 endometrioid endometrial adenocarcinoma who presents for phone follow-up. MMRd, MSI-H, p53 WT. HER2 neg (0).  Patient is overall doing well.  Discussed continued expectations and restrictions.  Reviewed findings from surgery again.  Patient was given a copy of her pathology report.  Discussed reasoning for adjuvant therapy with chemotherapy and radiation therapy.  She is scheduled to start chemotherapy tomorrow.    Reviewed again why hernia was not repaired at the time of surgery.  She luckily is no longer having the pain in her right lower quadrant that she had been previously.  Denies any symptoms related to her hernia.  Discussed the importance of continued medication to prevent  constipation as she starts chemotherapy.  28 minutes of total time was spent for this patient encounter, including preparation, face-to-face counseling with the patient and coordination of care, and documentation of the encounter.  Additional time was required for discussion of endometrial cancer and adjuvant therapy, which is beyond the expected time spent for a routine postoperative visit.  Comer Dollar, MD  Division of Gynecologic Oncology  Department of Obstetrics and Gynecology  [facility]

## 2024-05-01 NOTE — Patient Instructions (Signed)
 Please remember, no heavy lifting for 6 weeks after surgery and nothing in the vagina for 12 weeks.  You are healing very well from the surgery.  I will plan to see you after you have completed your chemotherapy and radiation.  Please do not hesitate to reach out if you need anything.

## 2024-05-02 ENCOUNTER — Other Ambulatory Visit: Admitting: Licensed Clinical Social Worker

## 2024-05-02 ENCOUNTER — Other Ambulatory Visit: Payer: Self-pay

## 2024-05-02 ENCOUNTER — Encounter: Payer: Self-pay | Admitting: Hematology and Oncology

## 2024-05-02 ENCOUNTER — Inpatient Hospital Stay (HOSPITAL_BASED_OUTPATIENT_CLINIC_OR_DEPARTMENT_OTHER): Admitting: Hematology and Oncology

## 2024-05-02 ENCOUNTER — Inpatient Hospital Stay

## 2024-05-02 VITALS — BP 197/87 | HR 96 | Temp 97.6°F | Resp 17 | Ht 66.0 in | Wt 272.0 lb

## 2024-05-02 VITALS — BP 150/78 | HR 61 | Temp 98.0°F | Resp 16

## 2024-05-02 DIAGNOSIS — C541 Malignant neoplasm of endometrium: Secondary | ICD-10-CM

## 2024-05-02 DIAGNOSIS — Z5111 Encounter for antineoplastic chemotherapy: Secondary | ICD-10-CM | POA: Diagnosis not present

## 2024-05-02 DIAGNOSIS — I10 Essential (primary) hypertension: Secondary | ICD-10-CM | POA: Diagnosis not present

## 2024-05-02 DIAGNOSIS — N1831 Chronic kidney disease, stage 3a: Secondary | ICD-10-CM

## 2024-05-02 DIAGNOSIS — D539 Nutritional anemia, unspecified: Secondary | ICD-10-CM

## 2024-05-02 LAB — T4: T4, Total: 7.8 ug/dL (ref 4.5–12.0)

## 2024-05-02 MED ORDER — SODIUM CHLORIDE 0.9 % IV SOLN
150.0000 mg | Freq: Once | INTRAVENOUS | Status: AC
  Administered 2024-05-02: 150 mg via INTRAVENOUS
  Filled 2024-05-02: qty 5

## 2024-05-02 MED ORDER — SODIUM CHLORIDE 0.9 % IV SOLN
361.0000 mg | Freq: Once | INTRAVENOUS | Status: AC
  Administered 2024-05-02: 360 mg via INTRAVENOUS
  Filled 2024-05-02: qty 36

## 2024-05-02 MED ORDER — DEXAMETHASONE SODIUM PHOSPHATE 10 MG/ML IJ SOLN
10.0000 mg | Freq: Once | INTRAMUSCULAR | Status: AC
  Administered 2024-05-02: 10 mg via INTRAVENOUS
  Filled 2024-05-02: qty 1

## 2024-05-02 MED ORDER — PALONOSETRON HCL INJECTION 0.25 MG/5ML
0.2500 mg | Freq: Once | INTRAVENOUS | Status: AC
  Administered 2024-05-02: 0.25 mg via INTRAVENOUS
  Filled 2024-05-02: qty 5

## 2024-05-02 MED ORDER — SODIUM CHLORIDE 0.9 % IV SOLN
200.0000 mg | Freq: Once | INTRAVENOUS | Status: AC
  Administered 2024-05-02: 200 mg via INTRAVENOUS
  Filled 2024-05-02: qty 200

## 2024-05-02 MED ORDER — FAMOTIDINE IN NACL 20-0.9 MG/50ML-% IV SOLN
20.0000 mg | Freq: Once | INTRAVENOUS | Status: AC
  Administered 2024-05-02: 20 mg via INTRAVENOUS
  Filled 2024-05-02: qty 50

## 2024-05-02 MED ORDER — CETIRIZINE HCL 10 MG/ML IV SOLN
10.0000 mg | Freq: Once | INTRAVENOUS | Status: AC
  Administered 2024-05-02: 10 mg via INTRAVENOUS
  Filled 2024-05-02: qty 1

## 2024-05-02 MED ORDER — SODIUM CHLORIDE 0.9 % IV SOLN: INTRAVENOUS | Status: DC

## 2024-05-02 MED ORDER — SODIUM CHLORIDE 0.9 % IV SOLN
80.0000 mg/m2 | Freq: Once | INTRAVENOUS | Status: AC
  Administered 2024-05-02: 162 mg via INTRAVENOUS
  Filled 2024-05-02: qty 27

## 2024-05-02 NOTE — Assessment & Plan Note (Addendum)
 Recent vitamin B12 and iron studies are adequate Observe closely

## 2024-05-02 NOTE — Patient Instructions (Signed)
 [facility] - A DEPT OF . [facility]  Discharge Instructions: Thank you for choosing [facility] to provide your oncology and hematology care.   If you have a lab appointment with the Cancer Center, please go directly to the Cancer Center and check in at the registration area.   Wear comfortable clothing and clothing appropriate for easy access to any Portacath or PICC line.   We strive to give you quality time with your provider. You may need to reschedule your appointment if you arrive late (15 or more minutes).  Arriving late affects you and other patients whose appointments are after yours.  Also, if you miss three or more appointments without notifying the office, you may be dismissed from the clinic at the provider's discretion.      For prescription refill requests, have your pharmacy contact our office and allow 72 hours for refills to be completed.    Today you received the following chemotherapy and/or immunotherapy agents  PEMBROLIZUMAB (Keytruda) PACLITAXEL (Taxol)  and  CARBOPLATIN    To help prevent nausea and vomiting after your treatment, we encourage you to take your nausea medication as directed.  BELOW ARE SYMPTOMS THAT SHOULD BE REPORTED IMMEDIATELY: *FEVER GREATER THAN 100.4 F (38 C) OR HIGHER *CHILLS OR SWEATING *NAUSEA AND VOMITING THAT IS NOT CONTROLLED WITH YOUR NAUSEA MEDICATION *UNUSUAL SHORTNESS OF BREATH *UNUSUAL BRUISING OR BLEEDING *URINARY PROBLEMS (pain or burning when urinating, or frequent urination) *BOWEL PROBLEMS (unusual diarrhea, constipation, pain near the anus) TENDERNESS IN MOUTH AND THROAT WITH OR WITHOUT PRESENCE OF ULCERS (sore throat, sores in mouth, or a toothache) UNUSUAL RASH, SWELLING OR PAIN  UNUSUAL VAGINAL DISCHARGE OR ITCHING   Items with * indicate a potential emergency and should be followed up as soon as possible or go to the Emergency Department if any problems should occur.  Please  show the CHEMOTHERAPY ALERT CARD or IMMUNOTHERAPY ALERT CARD at check-in to the Emergency Department and triage nurse.  Should you have questions after your visit or need to cancel or reschedule your appointment, please contact [facility] - A DEPT OF [facility]  Dept: [phone]  and follow the prompts.  Office hours are 8:00 a.m. to 4:30 p.m. Monday - Friday. Please note that voicemails left after 4:00 p.m. may not be returned until the following business day.  We are closed weekends and major holidays. You have access to a nurse at all times for urgent questions. Please call the main number to the clinic Dept: [phone] and follow the prompts.   For any non-urgent questions, you may also contact your provider using MyChart. We now offer e-Visits for anyone 14 and older to request care online for non-urgent symptoms. For details visit [email or web address].   Also download the MyChart app! Go to the app store, search MyChart, open the app, select [facility], and log in with your MyChart username and password.  Pembrolizumab Injection What is this medication? PEMBROLIZUMAB (PEM broe LIZ ue mab) treats some types of cancer. It works by helping your immune system slow or stop the spread of cancer cells. It is a monoclonal antibody. This medicine may be used for other purposes; ask your health care provider or pharmacist if you have questions. COMMON BRAND NAME(S): Keytruda What should I tell my care team before I take this medication? They need to know if you have any of these conditions: Allogeneic stem cell  transplant (uses someone else's stem cells) Autoimmune diseases, such as Crohn disease, ulcerative colitis, lupus History of chest radiation Nervous system problems, such as Guillain-Barre syndrome, myasthenia gravis Organ transplant An unusual or allergic reaction to pembrolizumab, other medications, foods, dyes, or preservatives Pregnant or  trying to get pregnant Breast-feeding How should I use this medication? This medication is injected into a vein. It is given by your care team in a hospital or clinic setting. A special MedGuide will be given to you before each treatment. Be sure to read this information carefully each time. Talk to your care team about the use of this medication in children. While it may be prescribed for children as young as 6 months for selected conditions, precautions do apply. Overdosage: If you think you have taken too much of this medicine contact a poison control center or emergency room at once. NOTE: This medicine is only for you. Do not share this medicine with others. What if I miss a dose? Keep appointments for follow-up doses. It is important not to miss your dose. Call your care team if you are unable to keep an appointment. What may interact with this medication? Interactions have not been studied. This list may not describe all possible interactions. Give your health care provider a list of all the medicines, herbs, non-prescription drugs, or dietary supplements you use. Also tell them if you smoke, drink alcohol, or use illegal drugs. Some items may interact with your medicine. What should I watch for while using this medication? Your condition will be monitored carefully while you are receiving this medication. You may need blood work while taking this medication. This medication may cause serious skin reactions. They can happen weeks to months after starting the medication. Contact your care team right away if you notice fevers or flu-like symptoms with a rash. The rash may be red or purple and then turn into blisters or peeling of the skin. You may also notice a red rash with swelling of the face, lips, or lymph nodes in your neck or under your arms. Tell your care team right away if you have any change in your eyesight. Talk to your care team if you may be pregnant. Serious birth defects can  occur if you take this medication during pregnancy and for 4 months after the last dose. You will need a negative pregnancy test before starting this medication. Contraception is recommended while taking this medication and for 4 months after the last dose. Your care team can help you find the option that works for you. Do not breastfeed while taking this medication and for 4 months after the last dose. What side effects may I notice from receiving this medication? Side effects that you should report to your care team as soon as possible: Allergic reactions--skin rash, itching, hives, swelling of the face, lips, tongue, or throat Dry cough, shortness of breath or trouble breathing Eye pain, redness, irritation, or discharge with blurry or decreased vision Heart muscle inflammation--unusual weakness or fatigue, shortness of breath, chest pain, fast or irregular heartbeat, dizziness, swelling of the ankles, feet, or hands Hormone gland problems--headache, sensitivity to light, unusual weakness or fatigue, dizziness, fast or irregular heartbeat, increased sensitivity to cold or heat, excessive sweating, constipation, hair loss, increased thirst or amount of urine, tremors or shaking, irritability Infusion reactions--chest pain, shortness of breath or trouble breathing, feeling faint or lightheaded Kidney injury (glomerulonephritis)--decrease in the amount of urine, red or dark brown urine, foamy or bubbly urine, swelling  of the ankles, hands, or feet Liver injury--right upper belly pain, loss of appetite, nausea, light-colored stool, dark yellow or brown urine, yellowing skin or eyes, unusual weakness or fatigue Pain, tingling, or numbness in the hands or feet, muscle weakness, change in vision, confusion or trouble speaking, loss of balance or coordination, trouble walking, seizures Rash, fever, and swollen lymph nodes Redness, blistering, peeling, or loosening of the skin, including inside the  mouth Sudden or severe stomach pain, bloody diarrhea, fever, nausea, vomiting Side effects that usually do not require medical attention (report to your care team if they continue or are bothersome): Bone, joint, or muscle pain Diarrhea Fatigue Loss of appetite Nausea Skin rash This list may not describe all possible side effects. Call your doctor for medical advice about side effects. You may report side effects to FDA at 1-800-FDA-1088. Where should I keep my medication? This medication is given in a hospital or clinic. It will not be stored at home. NOTE: This sheet is a summary. It may not cover all possible information. If you have questions about this medicine, talk to your doctor, pharmacist, or health care provider.  2024 Elsevier/Gold Standard (2021-11-29 00:00:00)  Paclitaxel Injection What is this medication? PACLITAXEL (PAK li TAX el) treats some types of cancer. It works by slowing down the growth of cancer cells. This medicine may be used for other purposes; ask your health care provider or pharmacist if you have questions. COMMON BRAND NAME(S): Onxol, Taxol What should I tell my care team before I take this medication? They need to know if you have any of these conditions: Heart disease Liver disease Low white blood cell levels An unusual or allergic reaction to paclitaxel, other medications, foods, dyes, or preservatives If you or your partner are pregnant or trying to get pregnant Breast-feeding How should I use this medication? This medication is injected into a vein. It is given by your care team in a hospital or clinic setting. Talk to your care team about the use of this medication in children. While it may be given to children for selected conditions, precautions do apply. Overdosage: If you think you have taken too much of this medicine contact a poison control center or emergency room at once. NOTE: This medicine is only for you. Do not share this medicine  with others. What if I miss a dose? Keep appointments for follow-up doses. It is important not to miss your dose. Call your care team if you are unable to keep an appointment. What may interact with this medication? Do not take this medication with any of the following: Live virus vaccines Other medications may affect the way this medication works. Talk with your care team about all of the medications you take. They may suggest changes to your treatment plan to lower the risk of side effects and to make sure your medications work as intended. This list may not describe all possible interactions. Give your health care provider a list of all the medicines, herbs, non-prescription drugs, or dietary supplements you use. Also tell them if you smoke, drink alcohol, or use illegal drugs. Some items may interact with your medicine. What should I watch for while using this medication? Your condition will be monitored carefully while you are receiving this medication. You may need blood work while taking this medication. This medication may make you feel generally unwell. This is not uncommon as chemotherapy can affect healthy cells as well as cancer cells. Report any side effects. Continue your  course of treatment even though you feel ill unless your care team tells you to stop. This medication can cause serious allergic reactions. To reduce the risk, your care team may give you other medications to take before receiving this one. Be sure to follow the directions from your care team. This medication may increase your risk of getting an infection. Call your care team for advice if you get a fever, chills, sore throat, or other symptoms of a cold or flu. Do not treat yourself. Try to avoid being around people who are sick. This medication may increase your risk to bruise or bleed. Call your care team if you notice any unusual bleeding. Be careful brushing or flossing your teeth or using a toothpick because you  may get an infection or bleed more easily. If you have any dental work done, tell your dentist you are receiving this medication. Talk to your care team if you may be pregnant. Serious birth defects can occur if you take this medication during pregnancy. Talk to your care team before breastfeeding. Changes to your treatment plan may be needed. What side effects may I notice from receiving this medication? Side effects that you should report to your care team as soon as possible: Allergic reactions--skin rash, itching, hives, swelling of the face, lips, tongue, or throat Heart rhythm changes--fast or irregular heartbeat, dizziness, feeling faint or lightheaded, chest pain, trouble breathing Increase in blood pressure Infection--fever, chills, cough, sore throat, wounds that don't heal, pain or trouble when passing urine, general feeling of discomfort or being unwell Low blood pressure--dizziness, feeling faint or lightheaded, blurry vision Low red blood cell level--unusual weakness or fatigue, dizziness, headache, trouble breathing Painful swelling, warmth, or redness of the skin, blisters or sores at the infusion site Pain, tingling, or numbness in the hands or feet Slow heartbeat--dizziness, feeling faint or lightheaded, confusion, trouble breathing, unusual weakness or fatigue Unusual bruising or bleeding Side effects that usually do not require medical attention (report to your care team if they continue or are bothersome): Diarrhea Hair loss Joint pain Loss of appetite Muscle pain Nausea Vomiting This list may not describe all possible side effects. Call your doctor for medical advice about side effects. You may report side effects to FDA at 1-800-FDA-1088. Where should I keep my medication? This medication is given in a hospital or clinic. It will not be stored at home. NOTE: This sheet is a summary. It may not cover all possible information. If you have questions about this medicine,  talk to your doctor, pharmacist, or health care provider.  2024 Elsevier/Gold Standard (2021-12-06 00:00:00)  Carboplatin Injection What is this medication? CARBOPLATIN (KAR boe pla tin) treats some types of cancer. It works by slowing down the growth of cancer cells. This medicine may be used for other purposes; ask your health care provider or pharmacist if you have questions. COMMON BRAND NAME(S): Paraplatin What should I tell my care team before I take this medication? They need to know if you have any of these conditions: Blood disorders Hearing problems Kidney disease Recent or ongoing radiation therapy An unusual or allergic reaction to carboplatin, cisplatin, other medications, foods, dyes, or preservatives Pregnant or trying to get pregnant Breast-feeding How should I use this medication? This medication is injected into a vein. It is given by your care team in a hospital or clinic setting. Talk to your care team about the use of this medication in children. Special care may be needed. Overdosage: If you think  you have taken too much of this medicine contact a poison control center or emergency room at once. NOTE: This medicine is only for you. Do not share this medicine with others. What if I miss a dose? Keep appointments for follow-up doses. It is important not to miss your dose. Call your care team if you are unable to keep an appointment. What may interact with this medication? Medications for seizures Some antibiotics, such as amikacin, gentamicin, neomycin, streptomycin, tobramycin Vaccines This list may not describe all possible interactions. Give your health care provider a list of all the medicines, herbs, non-prescription drugs, or dietary supplements you use. Also tell them if you smoke, drink alcohol, or use illegal drugs. Some items may interact with your medicine. What should I watch for while using this medication? Your condition will be monitored carefully  while you are receiving this medication. You may need blood work while taking this medication. This medication may make you feel generally unwell. This is not uncommon, as chemotherapy can affect healthy cells as well as cancer cells. Report any side effects. Continue your course of treatment even though you feel ill unless your care team tells you to stop. In some cases, you may be given additional medications to help with side effects. Follow all directions for their use. This medication may increase your risk of getting an infection. Call your care team for advice if you get a fever, chills, sore throat, or other symptoms of a cold or flu. Do not treat yourself. Try to avoid being around people who are sick. Avoid taking medications that contain aspirin , acetaminophen , ibuprofen, naproxen, or ketoprofen unless instructed by your care team. These medications may hide a fever. Be careful brushing or flossing your teeth or using a toothpick because you may get an infection or bleed more easily. If you have any dental work done, tell your dentist you are receiving this medication. Talk to your care team if you wish to become pregnant or think you might be pregnant. This medication can cause serious birth defects. Talk to your care team about effective forms of contraception. Do not breast-feed while taking this medication. What side effects may I notice from receiving this medication? Side effects that you should report to your care team as soon as possible: Allergic reactions--skin rash, itching, hives, swelling of the face, lips, tongue, or throat Infection--fever, chills, cough, sore throat, wounds that don't heal, pain or trouble when passing urine, general feeling of discomfort or being unwell Low red blood cell level--unusual weakness or fatigue, dizziness, headache, trouble breathing Pain, tingling, or numbness in the hands or feet, muscle weakness, change in vision, confusion or trouble speaking,  loss of balance or coordination, trouble walking, seizures Unusual bruising or bleeding Side effects that usually do not require medical attention (report to your care team if they continue or are bothersome): Hair loss Nausea Unusual weakness or fatigue Vomiting This list may not describe all possible side effects. Call your doctor for medical advice about side effects. You may report side effects to FDA at 1-800-FDA-1088. Where should I keep my medication? This medication is given in a hospital or clinic. It will not be stored at home. NOTE: This sheet is a summary. It may not cover all possible information. If you have questions about this medicine, talk to your doctor, pharmacist, or health care provider.  2024 Elsevier/Gold Standard (2021-11-08 00:00:00)

## 2024-05-02 NOTE — Progress Notes (Signed)
  Cancer Center OFFICE PROGRESS NOTE  Patient Care Team: [name], MD as PCP - General (Family Medicine) Michele Richardson, DO as PCP - Cardiology (Cardiology) Okey Arch, MD (Inactive) as Referring Physician (Obstetrics and Gynecology)  Assessment & Plan Endometrial cancer [facility]) The patient was diagnosed with endometrial cancer after an incidental finding noted on CT imaging She underwent complete hysterectomy, bilateral salpingo-oophorectomy, sentinel lymph node injection and biopsy and lysis of adhesions Final pathology: Mixed morphology, areas of endometrioid with some areas of mucinous features, MMR deficient, p53 wild-type, HER2/neu 0%  She will proceed with adjuvant chemotherapy cycle 1 today Due to baseline persistent peripheral neuropathy, I have prescribed upfront dose reduction for paclitaxel I reinforced some of the side effects to be expected I will see her again prior to cycle 2 of treatment Stage 3a chronic kidney disease (HCC) I will adjust the dose of chemotherapy according to her creatinine Deficiency anemia Recent vitamin B12 and iron studies are adequate Observe closely Primary hypertension Blood pressure is high today likely due to anxiety She is not symptomatic Observe only  No orders of the defined types were placed in this encounter.    [name] Bedford, MD  INTERVAL HISTORY: she returns for chemotherapy today She has uneventful port placement Her blood pressure is high today but she is not symptomatic We discussed some of her side effects to be expected  PHYSICAL EXAMINATION: ECOG PERFORMANCE STATUS: 0 - Asymptomatic  Vitals:   05/02/24 1213  BP: (!) 197/87  Pulse: 96  Resp: 17  Temp: 97.6 F (36.4 C)  SpO2: 98%   Filed Weights   05/02/24 1213  Weight: 272 lb (123.4 kg)    Relevant data reviewed during this visit included CBC, CMP, iron studies, vitamin B12

## 2024-05-02 NOTE — Assessment & Plan Note (Addendum)
 Blood pressure is high today likely due to anxiety She is not symptomatic Observe only

## 2024-05-02 NOTE — Progress Notes (Signed)
 Pt felt some palpitations after ambulating to the bathroom. She reports that she sometimes feels this way after climbing the stairs at home. EKG obtained per order from Dr. Lonn.. EKG reviewed by Dr. Lonn, ok to continue with treatment

## 2024-05-02 NOTE — Assessment & Plan Note (Addendum)
 The patient was diagnosed with endometrial cancer after an incidental finding noted on CT imaging She underwent complete hysterectomy, bilateral salpingo-oophorectomy, sentinel lymph node injection and biopsy and lysis of adhesions Final pathology: Mixed morphology, areas of endometrioid with some areas of mucinous features, MMR deficient, p53 wild-type, HER2/neu 0%  She will proceed with adjuvant chemotherapy cycle 1 today Due to baseline persistent peripheral neuropathy, I have prescribed upfront dose reduction for paclitaxel I reinforced some of the side effects to be expected I will see her again prior to cycle 2 of treatment

## 2024-05-02 NOTE — Assessment & Plan Note (Addendum)
 I will adjust the dose of chemotherapy according to her creatinine

## 2024-05-05 ENCOUNTER — Telehealth: Payer: Self-pay

## 2024-05-05 NOTE — Telephone Encounter (Signed)
-----   Message from Nurse Dawna HERO sent at 05/02/2024  4:25 PM EDT ----- Regarding: Dr. Lonn 1st tx f/u call Dr. Lonn 1st tx f/u call - 1st time keytruda, taxol, carbo - tolerated well (up through taxol) [place] needed to be hung at this time

## 2024-05-05 NOTE — Telephone Encounter (Signed)
 Called to see how she is doing. She denies constipation and will continue laxatives. Eating and drinking with no problems. She is complaining of some yellow/ green color to her left breast. Denies drainage and pain. Told her that the discoloration may be from port insertion on the left side and to call the office back for for further questions/concerns. She verbalize understanding.  FYI

## 2024-05-08 ENCOUNTER — Encounter (HOSPITAL_COMMUNITY): Payer: Self-pay | Admitting: Gynecologic Oncology

## 2024-05-12 ENCOUNTER — Telehealth: Payer: Self-pay

## 2024-05-12 NOTE — Telephone Encounter (Signed)
 Returned her call and reviewed upcoming appts. Verified mailing address and will mail out schedule per her request. Reviewed how to apply emal cream to port site prior to appt.

## 2024-05-15 ENCOUNTER — Encounter: Payer: Self-pay | Admitting: Gynecologic Oncology

## 2024-05-22 ENCOUNTER — Telehealth: Payer: Self-pay

## 2024-05-22 ENCOUNTER — Encounter: Payer: Self-pay | Admitting: Hematology and Oncology

## 2024-05-22 MED FILL — Fosaprepitant Dimeglumine For IV Infusion 150 MG (Base Eq): INTRAVENOUS | Qty: 5 | Status: AC

## 2024-05-22 NOTE — Telephone Encounter (Signed)
 SABRA

## 2024-05-22 NOTE — Telephone Encounter (Signed)
 Returned her call and reviewed appts/ how to take decadron . She verbalized understanding.

## 2024-05-23 ENCOUNTER — Inpatient Hospital Stay

## 2024-05-23 ENCOUNTER — Inpatient Hospital Stay: Admitting: Hematology and Oncology

## 2024-05-23 ENCOUNTER — Telehealth: Payer: Self-pay | Admitting: *Deleted

## 2024-05-23 ENCOUNTER — Encounter: Payer: Self-pay | Admitting: Hematology and Oncology

## 2024-05-23 VITALS — BP 200/93 | HR 95 | Temp 99.1°F | Resp 18 | Ht 66.0 in | Wt 274.0 lb

## 2024-05-23 DIAGNOSIS — C541 Malignant neoplasm of endometrium: Secondary | ICD-10-CM | POA: Diagnosis not present

## 2024-05-23 DIAGNOSIS — Z5111 Encounter for antineoplastic chemotherapy: Secondary | ICD-10-CM | POA: Diagnosis not present

## 2024-05-23 DIAGNOSIS — N1831 Chronic kidney disease, stage 3a: Secondary | ICD-10-CM

## 2024-05-23 DIAGNOSIS — D539 Nutritional anemia, unspecified: Secondary | ICD-10-CM

## 2024-05-23 DIAGNOSIS — I10 Essential (primary) hypertension: Secondary | ICD-10-CM | POA: Diagnosis not present

## 2024-05-23 LAB — CMP (CANCER CENTER ONLY)
ALT: 11 U/L (ref 0–44)
AST: 15 U/L (ref 15–41)
Albumin: 4.1 g/dL (ref 3.5–5.0)
Alkaline Phosphatase: 109 U/L (ref 38–126)
Anion gap: 10 (ref 5–15)
BUN: 16 mg/dL (ref 8–23)
CO2: 23 mmol/L (ref 22–32)
Calcium: 10.6 mg/dL — ABNORMAL HIGH (ref 8.9–10.3)
Chloride: 109 mmol/L (ref 98–111)
Creatinine: 1.23 mg/dL — ABNORMAL HIGH (ref 0.44–1.00)
GFR, Estimated: 44 mL/min — ABNORMAL LOW (ref >60)
Glucose, Bld: 136 mg/dL — ABNORMAL HIGH (ref 70–99)
Potassium: 4 mmol/L (ref 3.5–5.1)
Sodium: 142 mmol/L (ref 135–145)
Total Bilirubin: 0.4 mg/dL (ref 0.0–1.2)
Total Protein: 7.8 g/dL (ref 6.5–8.1)

## 2024-05-23 LAB — CBC WITH DIFFERENTIAL (CANCER CENTER ONLY)
Abs Immature Granulocytes: 0.1 K/uL — ABNORMAL HIGH (ref 0.00–0.07)
Basophils Absolute: 0 K/uL (ref 0.0–0.1)
Basophils Relative: 0 %
Eosinophils Absolute: 0 K/uL (ref 0.0–0.5)
Eosinophils Relative: 0 %
HCT: 33.9 % — ABNORMAL LOW (ref 36.0–46.0)
Hemoglobin: 11 g/dL — ABNORMAL LOW (ref 12.0–15.0)
Immature Granulocytes: 1 %
Lymphocytes Relative: 15 %
Lymphs Abs: 1.1 K/uL (ref 0.7–4.0)
MCH: 24.4 pg — ABNORMAL LOW (ref 26.0–34.0)
MCHC: 32.4 g/dL (ref 30.0–36.0)
MCV: 75.3 fL — ABNORMAL LOW (ref 80.0–100.0)
Monocytes Absolute: 0.2 K/uL (ref 0.1–1.0)
Monocytes Relative: 3 %
Neutro Abs: 5.8 K/uL (ref 1.7–7.7)
Neutrophils Relative %: 81 %
Platelet Count: 247 K/uL (ref 150–400)
RBC: 4.5 MIL/uL (ref 3.87–5.11)
RDW: 17.1 % — ABNORMAL HIGH (ref 11.5–15.5)
WBC Count: 7.2 K/uL (ref 4.0–10.5)
nRBC: 0 % (ref 0.0–0.2)

## 2024-05-23 MED ORDER — CETIRIZINE HCL 10 MG/ML IV SOLN
10.0000 mg | Freq: Once | INTRAVENOUS | Status: AC
  Administered 2024-05-23: 10 mg via INTRAVENOUS
  Filled 2024-05-23: qty 1

## 2024-05-23 MED ORDER — PALONOSETRON HCL INJECTION 0.25 MG/5ML
0.2500 mg | Freq: Once | INTRAVENOUS | Status: AC
  Administered 2024-05-23: 0.25 mg via INTRAVENOUS
  Filled 2024-05-23: qty 5

## 2024-05-23 MED ORDER — FAMOTIDINE IN NACL 20-0.9 MG/50ML-% IV SOLN
20.0000 mg | Freq: Once | INTRAVENOUS | Status: AC
  Administered 2024-05-23: 20 mg via INTRAVENOUS
  Filled 2024-05-23: qty 50

## 2024-05-23 MED ORDER — DEXAMETHASONE SOD PHOSPHATE PF 10 MG/ML IJ SOLN
10.0000 mg | Freq: Once | INTRAMUSCULAR | Status: AC
  Administered 2024-05-23: 10 mg via INTRAVENOUS

## 2024-05-23 MED ORDER — ACETAMINOPHEN 325 MG PO TABS
650.0000 mg | ORAL_TABLET | Freq: Once | ORAL | Status: AC
  Administered 2024-05-23: 650 mg via ORAL
  Filled 2024-05-23: qty 2

## 2024-05-23 MED ORDER — SODIUM CHLORIDE 0.9 % IV SOLN
200.0000 mg | Freq: Once | INTRAVENOUS | Status: AC
  Administered 2024-05-23: 200 mg via INTRAVENOUS
  Filled 2024-05-23: qty 200

## 2024-05-23 MED ORDER — SODIUM CHLORIDE 0.9 % IV SOLN
80.0000 mg/m2 | Freq: Once | INTRAVENOUS | Status: AC
  Administered 2024-05-23: 162 mg via INTRAVENOUS
  Filled 2024-05-23: qty 27

## 2024-05-23 MED ORDER — SODIUM CHLORIDE 0.9 % IV SOLN: INTRAVENOUS | Status: DC

## 2024-05-23 MED ORDER — SODIUM CHLORIDE 0.9 % IV SOLN
150.0000 mg | Freq: Once | INTRAVENOUS | Status: AC
  Administered 2024-05-23: 150 mg via INTRAVENOUS
  Filled 2024-05-23: qty 150

## 2024-05-23 MED ORDER — ALTEPLASE 2 MG IJ SOLR
2.0000 mg | Freq: Once | INTRAMUSCULAR | Status: AC
  Administered 2024-05-23: 2 mg
  Filled 2024-05-23: qty 2

## 2024-05-23 MED ORDER — SODIUM CHLORIDE 0.9 % IV SOLN
370.5000 mg | Freq: Once | INTRAVENOUS | Status: AC
  Administered 2024-05-23: 370 mg via INTRAVENOUS
  Filled 2024-05-23: qty 37

## 2024-05-23 NOTE — Assessment & Plan Note (Addendum)
 The patient was diagnosed with endometrial cancer after an incidental finding noted on CT imaging She underwent complete hysterectomy, bilateral salpingo-oophorectomy, sentinel lymph node injection and biopsy and lysis of adhesions Final pathology: Mixed morphology, areas of endometrioid with some areas of mucinous features, MMR deficient, p53 wild-type, HER2/neu 0%  She tolerated cycle 1 of treatment well except for mild anemia and elevated blood pressure We will proceed with cycle 2 without delay

## 2024-05-23 NOTE — Telephone Encounter (Signed)
 CALLED PATIENT TO REMIND OF NEW HDR [facility] FOR 05-27-24, LVM FOR A RETURN CALL

## 2024-05-23 NOTE — Assessment & Plan Note (Addendum)
 I will adjust the dose of chemotherapy according to her creatinine

## 2024-05-23 NOTE — Patient Instructions (Signed)
 [facility] - A DEPT OF [facility]  Discharge Instructions: Thank you for choosing [facility] to provide your oncology and hematology care.   If you have a lab appointment with the Cancer Center, please go directly to the Cancer Center and check in at the registration area.   Wear comfortable clothing and clothing appropriate for easy access to any Portacath or PICC line.   We strive to give you quality time with your provider. You may need to reschedule your appointment if you arrive late (15 or more minutes).  Arriving late affects you and other patients whose appointments are after yours.  Also, if you miss three or more appointments without notifying the office, you may be dismissed from the clinic at the provider's discretion.      For prescription refill requests, have your pharmacy contact our office and allow 72 hours for refills to be completed.    Today you received the following chemotherapy and/or immunotherapy agents: pembrolizumab, paclitaxel, and carboplatin      To help prevent nausea and vomiting after your treatment, we encourage you to take your nausea medication as directed.  BELOW ARE SYMPTOMS THAT SHOULD BE REPORTED IMMEDIATELY: *FEVER GREATER THAN 100.4 F (38 C) OR HIGHER *CHILLS OR SWEATING *NAUSEA AND VOMITING THAT IS NOT CONTROLLED WITH YOUR NAUSEA MEDICATION *UNUSUAL SHORTNESS OF BREATH *UNUSUAL BRUISING OR BLEEDING *URINARY PROBLEMS (pain or burning when urinating, or frequent urination) *BOWEL PROBLEMS (unusual diarrhea, constipation, pain near the anus) TENDERNESS IN MOUTH AND THROAT WITH OR WITHOUT PRESENCE OF ULCERS (sore throat, sores in mouth, or a toothache) UNUSUAL RASH, SWELLING OR PAIN  UNUSUAL VAGINAL DISCHARGE OR ITCHING   Items with * indicate a potential emergency and should be followed up as soon as possible or go to the Emergency Department if any problems should occur.  Please show the  CHEMOTHERAPY ALERT CARD or IMMUNOTHERAPY ALERT CARD at check-in to the Emergency Department and triage nurse.  Should you have questions after your visit or need to cancel or reschedule your appointment, please contact [facility] - A DEPT OF [facility]  Dept: [phone]  and follow the prompts.  Office hours are 8:00 a.m. to 4:30 p.m. Monday - Friday. Please note that voicemails left after 4:00 p.m. may not be returned until the following business day.  We are closed weekends and major holidays. You have access to a nurse at all times for urgent questions. Please call the main number to the clinic Dept: [phone] and follow the prompts.   For any non-urgent questions, you may also contact your provider using MyChart. We now offer e-Visits for anyone 7 and older to request care online for non-urgent symptoms. For details visit [email or web address].   Also download the MyChart app! Go to the app store, search MyChart, open the app, select [place], and log in with your MyChart username and password.

## 2024-05-23 NOTE — Progress Notes (Signed)
 [facility] OFFICE PROGRESS NOTE  Patient Care Team: [name], MD as PCP - General (Family Medicine) Michele Richardson, DO as PCP - Cardiology (Cardiology) Okey Arch, MD (Inactive) as Referring Physician (Obstetrics and Gynecology)  Assessment & Plan Endometrial cancer [facility]) The patient was diagnosed with endometrial cancer after an incidental finding noted on CT imaging She underwent complete hysterectomy, bilateral salpingo-oophorectomy, sentinel lymph node injection and biopsy and lysis of adhesions Final pathology: Mixed morphology, areas of endometrioid with some areas of mucinous features, MMR deficient, p53 wild-type, HER2/neu 0%  She tolerated cycle 1 of treatment well except for mild anemia and elevated blood pressure We will proceed with cycle 2 without delay  Stage 3a chronic kidney disease (HCC) I will adjust the dose of chemotherapy according to her creatinine Deficiency anemia Recent vitamin B12 and iron studies are adequate Observe closely Essential hypertension Blood pressure is elevated today likely due to anxiety She is not symptomatic Advised the patient to make sure she takes her blood pressure medicine in the morning before chemo in the future  No orders of the defined types were placed in this encounter.    Almarie Bedford, MD  INTERVAL HISTORY: she returns for treatment follow-up Complications related to previous cycle of chemotherapy included anemia,, elevated BP, and elevated serum creatinine She has pre-existing peripheral neuropathy.  This is not worse  PHYSICAL EXAMINATION: ECOG PERFORMANCE STATUS: 1 - Symptomatic but completely ambulatory  No results found for: CAN125    Latest Ref Rng & Units 05/23/2024   11:54 AM 05/01/2024   12:53 PM 04/04/2024    4:12 AM  CBC  WBC 4.0 - 10.5 K/uL 7.2  7.8  10.7   Hemoglobin 12.0 - 15.0 g/dL 88.9  88.8  89.3   Hematocrit 36.0 - 46.0 % 33.9  34.4  35.7   Platelets 150 - 400 K/uL 247  261   292       Chemistry      Component Value Date/Time   NA 142 05/23/2024 1154   NA 143 10/02/2022 0832   NA 140 06/01/2016 1143   K 4.0 05/23/2024 1154   K 4.0 06/01/2016 1143   CL 109 05/23/2024 1154   CL 105 12/09/2012 1423   CO2 23 05/23/2024 1154   CO2 26 06/01/2016 1143   BUN 16 05/23/2024 1154   BUN 15 10/02/2022 0832   BUN 12.5 06/01/2016 1143   CREATININE 1.23 (H) 05/23/2024 1154   CREATININE 1.1 06/01/2016 1143   GLU 100 06/30/2019 0000      Component Value Date/Time   CALCIUM  10.6 (H) 05/23/2024 1154   CALCIUM  10.1 06/01/2016 1143   ALKPHOS 109 05/23/2024 1154   ALKPHOS 69 06/01/2016 1143   AST 15 05/23/2024 1154   AST 11 06/01/2016 1143   ALT 11 05/23/2024 1154   ALT 12 06/01/2016 1143   BILITOT 0.4 05/23/2024 1154   BILITOT 0.28 06/01/2016 1143       Vitals:   05/23/24 1227  BP: (!) 200/93  Pulse: 95  Resp: 18  Temp: 99.1 F (37.3 C)  SpO2: 96%   Filed Weights   05/23/24 1227  Weight: 274 lb (124.3 kg)   Other relevant data reviewed during this visit included CBC and CMP

## 2024-05-23 NOTE — Assessment & Plan Note (Addendum)
 Blood pressure is elevated today likely due to anxiety She is not symptomatic Advised the patient to make sure she takes her blood pressure medicine in the morning before chemo in the future

## 2024-05-23 NOTE — Assessment & Plan Note (Addendum)
 Recent vitamin B12 and iron studies are adequate Observe closely

## 2024-05-26 ENCOUNTER — Telehealth: Payer: Self-pay | Admitting: *Deleted

## 2024-05-26 NOTE — Telephone Encounter (Signed)
 CALLED PATIENT TO REMIND OF NEW HDR [facility] FOR 05-27-24, LVM FOR A RETURN CALL

## 2024-05-26 NOTE — Progress Notes (Signed)
  Radiation Oncology         (336) [phone] ________________________________  Name: [name] MRN: [id number]  Date: 05/27/2024  DOB: [birth year]  CC: [name], MD  [name], MD  HDR BRACHYTHERAPY NOTE  DIAGNOSIS: Stage IIIA1 grade 2 endometrioid endometrial adenocarcinoma    Simple treatment device note: Patient had construction of her custom vaginal cylinder. She will be treated with a 3.0 cm diameter segmented cylinder. This conforms to her anatomy without undue discomfort.  Vaginal brachytherapy procedure node: The patient was brought to the HDR suite. Identity was confirmed. All relevant records and images related to the planned course of therapy were reviewed. The patient freely provided informed written consent to proceed with treatment after reviewing the details related to the planned course of therapy. The consent form was witnessed and verified by the simulation staff. Then, the patient was set-up in a stable reproducible supine position for radiation therapy. Pelvic exam revealed the vaginal cuff to be intact . The patient's custom vaginal cylinder was placed in the proximal vagina. This was affixed to the CT/MR stabilization plate to prevent slippage. Patient tolerated the placement well.  Verification simulation note:  A fiducial marker was placed within the vaginal cylinder. An AP and lateral film was then obtained through the pelvis area. This documented accurate position of the vaginal cylinder for treatment.  HDR BRACHYTHERAPY TREATMENT  The remote afterloading device was affixed to the vaginal cylinder by catheter. Patient then proceeded to undergo her first high-dose-rate treatment directed at the proximal vagina. The patient was prescribed a dose of 6.0 gray to be delivered to the mucosal surface. Treatment length was 3.0 cm. Patient was treated with 1 channel using 6 dwell positions. Treatment time was 202.3 seconds. Iridium 192 was the  high-dose-rate source for treatment. The patient tolerated the treatment well. After completion of her therapy, a radiation survey was performed documenting return of the iridium source into the GammaMed safe.   PLAN: patient will return next week to undergo her second high-dose-rate treatment.  ________________________________  Lynwood CHARM Nasuti, PhD, MD   This document serves as a record of services personally performed by Lynwood Nasuti, MD. It was created on his behalf by Reymundo Cartwright, a trained medical scribe. The creation of this record is based on the scribe's personal observations and the provider's statements to them. This document has been checked and approved by the attending provider.

## 2024-05-26 NOTE — Progress Notes (Signed)
 Radiation Oncology         [phone] ________________________________  Name: [name] MRN: [id number]  Date: 05/27/2024  DOB: [birth year]  Vaginal Brachytherapy Procedure Note  CC: [name], MD [name], MD  No diagnosis found.  Diagnosis: Stage IIIA1 grade 2 endometrioid endometrial adenocarcinoma   Radiation Treatment Dates: patient will undergo her first treatment later today   Narrative: She returns today for vaginal cylinder fitting. She was last seen in office on 04/17/24 for a consult visit.   Patient case was presented to the tumor board on 04/21/24 with the disposition is to adjuvant chemotherapy and vaginal brachytherapy.   In the interval since she was last seen, she presented for a follow up visit with Dr. Lonn on 05/02/24 during which they opted to proceed with her first cycle of chemotherapy. Due to her baseline persistent peripheral neuropathy, Dr. Lonn reduced the initial dose of I have paclitaxel.  No other significant oncologic interval history since the patient was last seen.            ALLERGIES: is allergic to lyrica  [pregabalin ], cymbalta [duloxetine hcl], gabapentin , lisinopril , maxzide [hydrochlorothiazide -triamterene], niacin , pravastatin sodium, simvastatin , and lodine [etodolac].  Meds: Current Outpatient Medications  Medication Sig Dispense Refill   acetaminophen  (TYLENOL ) 500 MG tablet Take 1,000 mg by mouth daily as needed for headache, mild pain (pain score 1-3) or moderate pain (pain score 4-6).     aspirin  EC 81 MG tablet Take 81 mg by mouth daily. Swallow whole.     atorvastatin  (LIPITOR) 20 MG tablet TAKE 1 TABLET(20 MG) BY MOUTH AT BEDTIME 90 tablet 3   Cholecalciferol  (VITAMIN D -3) 25 MCG (1000 UT) CAPS Take 1 capsule by mouth daily.     clotrimazole-betamethasone  (LOTRISONE) cream Apply 1 Application topically 2 (two) times daily.     dexamethasone  (DECADRON ) 4 MG tablet Take 2 tabs at the night before and 2  tab the morning of chemotherapy, every 3 weeks, by mouth x 6 cycles 24 tablet 6   escitalopram  (LEXAPRO ) 20 MG tablet Take 20 mg by mouth daily.     ezetimibe  (ZETIA ) 10 MG tablet Take 10 mg by mouth at bedtime.     Glycerin-Polysorbate 80 (REFRESH DRY EYE THERAPY OP) Place 1 drop into both eyes in the morning and at bedtime.     hydrALAZINE (APRESOLINE) 25 MG tablet Take 25 mg by mouth daily.     hydrochlorothiazide  (HYDRODIURIL ) 25 MG tablet Take 1 tablet (25 mg total) by mouth daily. 30 tablet 0   HYDROcodone -acetaminophen  (NORCO/VICODIN) 5-325 MG tablet Take 1 tablet by mouth every 6 (six) hours as needed for severe pain (pain score 7-10). For AFTER surgery only, do not take and drive. Monitor tylenol  intake. 15 tablet 0   hydrOXYzine (ATARAX) 25 MG tablet Take 25 mg by mouth in the morning and at bedtime.     irbesartan  (AVAPRO ) 300 MG tablet Take 1 tablet (300 mg total) by mouth at bedtime. (Patient taking differently: Take 300 mg by mouth daily.) 30 tablet 0   lidocaine -prilocaine  (EMLA ) cream Apply to affected area once 30 g 3   metFORMIN  (GLUCOPHAGE ) 500 MG tablet Take 1 tablet (500 mg total) by mouth daily with breakfast. 90 tablet 0   nystatin powder Apply 1 Application topically 2 (two) times daily.     ondansetron  (ZOFRAN ) 8 MG tablet Take 1 tablet (8 mg total) by mouth every 8 (eight) hours as needed for nausea or vomiting. Start on the third day  after carboplatin. 30 tablet 1   polyethylene glycol (MIRALAX  / GLYCOLAX ) 17 g packet Take 17 g by mouth daily.     prochlorperazine  (COMPAZINE ) 10 MG tablet Take 1 tablet (10 mg total) by mouth every 6 (six) hours as needed for nausea or vomiting. 30 tablet 1   verapamil  (VERELAN  PM) 240 MG 24 hr capsule Take 240 mg by mouth at bedtime.     No current facility-administered medications for this visit.    Physical Findings: The patient is in no acute distress. Patient is alert and oriented.  vitals were not taken for this visit.   No  palpable cervical, supraclavicular or axillary lymphoadenopathy. The heart has a regular rate and rhythm. The lungs are clear to auscultation. Abdomen soft and non-tender.  On pelvic examination the external genitalia were unremarkable. A speculum exam was performed. Vaginal cuff intact, no mucosal lesions. On bimanual exam there were no pelvic masses appreciated.  Lab Findings: Lab Results  Component Value Date   WBC 7.2 05/23/2024   HGB 11.0 (L) 05/23/2024   HCT 33.9 (L) 05/23/2024   MCV 75.3 (L) 05/23/2024   PLT 247 05/23/2024    Radiographic Findings: IR IMAGING GUIDED PORT INSERTION Result Date: 04/28/2024 INDICATION: 78 year old with endometrial cancer. Port-A-Cath needed for treatment. EXAM: FLUOROSCOPIC AND ULTRASOUND GUIDED PLACEMENT OF A SUBCUTANEOUS PORT MEDICATIONS: Moderate sedation ANESTHESIA/SEDATION: Moderate (conscious) sedation was employed during this procedure. A total of Versed  4 mg and fentanyl  200 mcg was administered intravenously at the order of the provider performing the procedure. Total intra-service moderate sedation time: 94 minutes. Patient's level of consciousness and vital signs were monitored continuously by radiology nurse throughout the procedure under the supervision of the provider performing the procedure. FLUOROSCOPY TIME:  Radiation Exposure Index (as provided by the fluoroscopic device): 232 mGy Kerma COMPLICATIONS: None immediate. PROCEDURE: The procedure, risks, benefits, and alternatives were explained to the patient. Questions regarding the procedure were encouraged and answered. The patient understands and consents to the procedure. Patient was placed supine on the interventional table. Ultrasound confirmed a patent right internal jugular vein. Ultrasound image was saved for documentation. The right chest and neck were cleaned with a skin antiseptic and a sterile drape was placed. Maximal barrier sterile technique was utilized including caps, mask,  sterile gowns, sterile gloves, sterile drape, hand hygiene and skin antiseptic. The right neck was anesthetized with 1% lidocaine . Small incision was made in the right neck with a blade. Right innominate/proximal common carotid artery is very tortuous and pulsatile. After the right neck was anesthetized with lidocaine , the right internal jugular vein was compressed by the adjacent artery. I was concerned about chronic compression of the internal jugular vein with a catheter at this location. Therefore, attention was directed to the left side of the neck and chest. Ultrasound confirmed a patent left internal jugular vein and ultrasound image was saved for documentation. The left neck and chest were prepped and draped in sterile fashion. Left neck was anesthetized with 1% lidocaine . Small incision was made. Using ultrasound guidance, 21 gauge needle was directed into the left internal jugular vein and a 0.018 wire was placed. Micropuncture set was placed in the left internal jugular vein with fluoroscopic guidance. The left chest was anesthetized with 1% lidocaine  with epinephrine . #15 blade was used to make an incision and a subcutaneous port pocket was formed. 8 french [employer] was selected. Subcutaneous tunnel was formed with a stiff tunneling device. The port catheter was brought through the subcutaneous tunnel.  The micropuncture set was exchanged for a peel-away sheath. Central veins are very tortuous. Therefore, a 5 French Kumpe catheter was advanced into the IVC using a stiff Glidewire. Peel-away sheath advanced into the superior vena cava. Port catheter was placed through the peel-away sheath into the central veins. Port was injected to confirm placement near the superior cavoatrial junction. External portion of the port was cut to an appropriate length and connected to the port. The port was placed in subcutaneous pocket. Catheter placement was confirmed with fluoroscopy. The port was accessed and flushed  with heparinized saline. The port pocket was closed using two layers of absorbable sutures and Dermabond. The vein skin site was closed using a single layer of absorbable suture and Dermabond. Sterile dressings were applied. Patient tolerated the procedure well without an immediate complication. Ultrasound and fluoroscopic images were taken and saved for this procedure. IMPRESSION: Placement of a left jugular subcutaneous power-injectable port device. Catheter tip at the superior cavoatrial junction. Electronically Signed   By: Juliene Balder M.D.   On: 04/28/2024 14:23    Impression: Stage IIIA1 grade 2 endometrioid endometrial adenocarcinoma   Patient was fitted for a vaginal cylinder. The patient will be treated with a *** cm diameter cylinder with a treatment length of *** cm. This distended the vaginal vault without undue discomfort. The patient tolerated the procedure well.  The patient was successfully fitted for a vaginal cylinder. The patient is appropriate to begin vaginal brachytherapy.   Plan: The patient will proceed with CT simulation and vaginal brachytherapy today.    _______________________________   Lynwood CHARM Nasuti, PhD, MD  This document serves as a record of services personally performed by Lynwood Nasuti, MD. It was created on his behalf by Reymundo Cartwright, a trained medical scribe. The creation of this record is based on the scribe's personal observations and the provider's statements to them. This document has been checked and approved by the attending provider.

## 2024-05-27 ENCOUNTER — Ambulatory Visit
Admission: RE | Admit: 2024-05-27 | Discharge: 2024-05-27 | Disposition: A | Discharge status: Home / Self Care | Source: Ambulatory Visit | Attending: Radiation Oncology | Admitting: Radiation Oncology

## 2024-05-27 ENCOUNTER — Encounter: Payer: Self-pay | Admitting: Radiation Oncology

## 2024-05-27 ENCOUNTER — Other Ambulatory Visit: Payer: Self-pay

## 2024-05-27 VITALS — BP 159/88 | HR 90 | Temp 97.7°F | Resp 20 | Ht 66.0 in | Wt 271.0 lb

## 2024-05-27 DIAGNOSIS — C541 Malignant neoplasm of endometrium: Secondary | ICD-10-CM

## 2024-05-27 LAB — RAD ONC ARIA SESSION SUMMARY
Course Elapsed Days: 0
Plan Fractions Treated to Date: 1
Plan Prescribed Dose Per Fraction: 6 Gy
Plan Total Fractions Prescribed: 5
Plan Total Prescribed Dose: 30 Gy
Reference Point Dosage Given to Date: 6 Gy
Reference Point Session Dosage Given: 6 Gy
Session Number: 1

## 2024-05-27 NOTE — Progress Notes (Signed)
 [name] is here today for follow up post radiation to the pelvic.    Does the patient complain of any of the following:  Pain: Yes, reports having a sharp, shooting pain in the vagina. Rates pain at 8/10.  Abdominal bloating: No Diarrhea/Constipation: Yes, constipation.  Nausea/Vomiting: No Vaginal Discharge: No Blood in Urine or Stool: No Urinary Issues (dysuria/incomplete emptying/ incontinence/ increased frequency/urgency): No    Additional comments if applicable:  BP (!) 159/88 (BP Location: Left Wrist, Patient Position: Sitting, Cuff Size: Large)   Pulse 90   Temp 97.7 F (36.5 C)   Resp 20   Ht 5' 6 (1.676 m)   Wt 271 lb (122.9 kg)   SpO2 95%   BMI 43.74 kg/m

## 2024-05-30 ENCOUNTER — Telehealth: Payer: Self-pay | Admitting: *Deleted

## 2024-05-30 NOTE — Telephone Encounter (Signed)
 CALLED PATIENT TO REMIND OF HDR TX. FOR 06-02-24 @ 2 PM, LVM FOR A RETURN CALL

## 2024-06-01 NOTE — Progress Notes (Signed)
  Radiation Oncology         (336) [phone] ________________________________  Name: [name] MRN: [id number]  Date: 06/02/2024  DOB: 22-Jul-1946  CC: [name], MD  [name], MD  HDR BRACHYTHERAPY NOTE  DIAGNOSIS: Stage IIIA1 grade 2 endometrioid endometrial adenocarcinoma    Simple treatment device note: Patient had construction of her custom vaginal cylinder. She will be treated with a 3.0 cm diameter segmented cylinder. This conforms to her anatomy without undue discomfort.  Vaginal brachytherapy procedure node: The patient was brought to the HDR suite. Identity was confirmed. All relevant records and images related to the planned course of therapy were reviewed. The patient freely provided informed written consent to proceed with treatment after reviewing the details related to the planned course of therapy. The consent form was witnessed and verified by the simulation staff. Then, the patient was set-up in a stable reproducible supine position for radiation therapy. Pelvic exam revealed the vaginal cuff to be intact . The patient's custom vaginal cylinder was placed in the proximal vagina. This was affixed to the CT/MR stabilization plate to prevent slippage. Patient tolerated the placement well.  Verification simulation note:  A fiducial marker was placed within the vaginal cylinder. An AP and lateral film was then obtained through the pelvis area. This documented accurate position of the vaginal cylinder for treatment.  HDR BRACHYTHERAPY TREATMENT  The remote afterloading device was affixed to the vaginal cylinder by catheter. Patient then proceeded to undergo her second high-dose-rate treatment directed at the proximal vagina. The patient was prescribed a dose of 6.0 gray to be delivered to the mucosal surface. Treatment length was 3.0 cm. Patient was treated with 1 channel using 6 dwell positions. Treatment time was 214.0 seconds. Iridium 192 was the  high-dose-rate source for treatment. The patient tolerated the treatment well. After completion of her therapy, a radiation survey was performed documenting return of the iridium source into the Gamma[employer] safe.   PLAN: patient will return next week to undergo her third high-dose-rate treatment.  ________________________________  -----------------------------------  Lynwood CHARM Nasuti, PhD, MD  This document serves as a record of services personally performed by Lynwood Nasuti, MD. It was created on his behalf by Dorthy Fuse, a trained medical scribe. The creation of this record is based on the scribe's personal observations and the provider's statements to them. This document has been checked and approved by the attending provider.

## 2024-06-02 ENCOUNTER — Other Ambulatory Visit: Payer: Self-pay

## 2024-06-02 ENCOUNTER — Ambulatory Visit
Admission: RE | Admit: 2024-06-02 | Discharge: 2024-06-02 | Disposition: A | Discharge status: Home / Self Care | Source: Ambulatory Visit | Attending: Radiation Oncology | Admitting: Radiation Oncology

## 2024-06-02 DIAGNOSIS — C541 Malignant neoplasm of endometrium: Secondary | ICD-10-CM | POA: Insufficient documentation

## 2024-06-02 LAB — RAD ONC ARIA SESSION SUMMARY
Course Elapsed Days: 6
Plan Fractions Treated to Date: 2
Plan Prescribed Dose Per Fraction: 6 Gy
Plan Total Fractions Prescribed: 5
Plan Total Prescribed Dose: 30 Gy
Reference Point Dosage Given to Date: 12 Gy
Reference Point Session Dosage Given: 6 Gy
Session Number: 2

## 2024-06-06 ENCOUNTER — Telehealth: Payer: Self-pay | Admitting: *Deleted

## 2024-06-06 NOTE — Telephone Encounter (Signed)
 CALLED PATIENT TO REMIND OF HDR TX. FOR 06-09-24 @ 2 PM, SPOKE WITH PATIENT AND SHE IS AWARE OF THIS TX.

## 2024-06-08 NOTE — Progress Notes (Signed)
  Radiation Oncology         [phone]) [phone] ________________________________  Name: Trinidy Masterson MRN: [id number]  Date: 06/09/2024  DOB: May 24, 1946  CC: [name], MD  [name], MD  HDR BRACHYTHERAPY NOTE  DIAGNOSIS: Stage IIIA1 grade 2 endometrioid endometrial adenocarcinoma    Simple treatment device note: Patient had construction of her custom vaginal cylinder. She will be treated with a 3.0 cm diameter segmented cylinder. This conforms to her anatomy without undue discomfort.  Vaginal brachytherapy procedure node: The patient was brought to the HDR suite. Identity was confirmed. All relevant records and images related to the planned course of therapy were reviewed. The patient freely provided informed written consent to proceed with treatment after reviewing the details related to the planned course of therapy. The consent form was witnessed and verified by the simulation staff. Then, the patient was set-up in a stable reproducible supine position for radiation therapy. Pelvic exam revealed the vaginal cuff to be intact . The patient's custom vaginal cylinder was placed in the proximal vagina. This was affixed to the CT/MR stabilization plate to prevent slippage. Patient tolerated the placement well.  Verification simulation note:  A fiducial marker was placed within the vaginal cylinder. An AP and lateral film was then obtained through the pelvis area. This documented accurate position of the vaginal cylinder for treatment.  HDR BRACHYTHERAPY TREATMENT  The remote afterloading device was affixed to the vaginal cylinder by catheter. Patient then proceeded to undergo her third high-dose-rate treatment directed at the proximal vagina. The patient was prescribed a dose of 6.0 gray to be delivered to the mucosal surface. Treatment length was 3.0 cm. Patient was treated with 1 channel using 6 dwell positions. Treatment time was 228.7  seconds. Iridium 192 was the  high-dose-rate source for treatment. The patient tolerated the treatment well. After completion of her therapy, a radiation survey was performed documenting return of the iridium source into the [employer] safe.   PLAN: patient will return next week to undergo her fourth high-dose-rate treatment.  ________________________________  -----------------------------------  Lynwood CHARM Nasuti, PhD, MD  This document serves as a record of services personally performed by Lynwood Nasuti, MD. It was created on his behalf by Dorthy Fuse, a trained medical scribe. The creation of this record is based on the scribe's personal observations and the provider's statements to them. This document has been checked and approved by the attending provider.

## 2024-06-09 ENCOUNTER — Ambulatory Visit
Admission: RE | Admit: 2024-06-09 | Discharge: 2024-06-09 | Disposition: A | Discharge status: Home / Self Care | Source: Ambulatory Visit | Attending: Radiation Oncology | Admitting: Radiation Oncology

## 2024-06-09 ENCOUNTER — Other Ambulatory Visit: Payer: Self-pay

## 2024-06-09 DIAGNOSIS — C541 Malignant neoplasm of endometrium: Secondary | ICD-10-CM

## 2024-06-09 LAB — RAD ONC ARIA SESSION SUMMARY
Course Elapsed Days: 13
Plan Fractions Treated to Date: 3
Plan Prescribed Dose Per Fraction: 6 Gy
Plan Total Fractions Prescribed: 5
Plan Total Prescribed Dose: 30 Gy
Reference Point Dosage Given to Date: 18 Gy
Reference Point Session Dosage Given: 6 Gy
Session Number: 3

## 2024-06-12 ENCOUNTER — Encounter: Payer: Self-pay | Admitting: Neurology

## 2024-06-12 MED FILL — Fosaprepitant Dimeglumine For IV Infusion 150 MG (Base Eq): INTRAVENOUS | Qty: 5 | Status: AC

## 2024-06-13 ENCOUNTER — Inpatient Hospital Stay: Attending: Gynecologic Oncology

## 2024-06-13 ENCOUNTER — Encounter: Payer: Self-pay | Admitting: Hematology and Oncology

## 2024-06-13 ENCOUNTER — Inpatient Hospital Stay (HOSPITAL_BASED_OUTPATIENT_CLINIC_OR_DEPARTMENT_OTHER): Admitting: Hematology and Oncology

## 2024-06-13 ENCOUNTER — Inpatient Hospital Stay

## 2024-06-13 VITALS — BP 147/87 | HR 97 | Resp 18

## 2024-06-13 VITALS — BP 167/95 | HR 85 | Temp 97.7°F | Resp 18 | Ht 66.0 in | Wt 269.8 lb

## 2024-06-13 DIAGNOSIS — D649 Anemia, unspecified: Secondary | ICD-10-CM | POA: Diagnosis not present

## 2024-06-13 DIAGNOSIS — C541 Malignant neoplasm of endometrium: Secondary | ICD-10-CM | POA: Insufficient documentation

## 2024-06-13 DIAGNOSIS — Z5111 Encounter for antineoplastic chemotherapy: Secondary | ICD-10-CM | POA: Insufficient documentation

## 2024-06-13 DIAGNOSIS — Z79899 Other long term (current) drug therapy: Secondary | ICD-10-CM | POA: Insufficient documentation

## 2024-06-13 DIAGNOSIS — D539 Nutritional anemia, unspecified: Secondary | ICD-10-CM | POA: Diagnosis not present

## 2024-06-13 DIAGNOSIS — N1831 Chronic kidney disease, stage 3a: Secondary | ICD-10-CM

## 2024-06-13 LAB — CBC WITH DIFFERENTIAL (CANCER CENTER ONLY)
Abs Immature Granulocytes: 0.04 K/uL (ref 0.00–0.07)
Basophils Absolute: 0 K/uL (ref 0.0–0.1)
Basophils Relative: 0 %
Eosinophils Absolute: 0 K/uL (ref 0.0–0.5)
Eosinophils Relative: 0 %
HCT: 33.4 % — ABNORMAL LOW (ref 36.0–46.0)
Hemoglobin: 10.9 g/dL — ABNORMAL LOW (ref 12.0–15.0)
Immature Granulocytes: 1 %
Lymphocytes Relative: 14 %
Lymphs Abs: 0.9 K/uL (ref 0.7–4.0)
MCH: 24.5 pg — ABNORMAL LOW (ref 26.0–34.0)
MCHC: 32.6 g/dL (ref 30.0–36.0)
MCV: 75.2 fL — ABNORMAL LOW (ref 80.0–100.0)
Monocytes Absolute: 0.2 K/uL (ref 0.1–1.0)
Monocytes Relative: 4 %
Neutro Abs: 5.2 K/uL (ref 1.7–7.7)
Neutrophils Relative %: 81 %
Platelet Count: 237 K/uL (ref 150–400)
RBC: 4.44 MIL/uL (ref 3.87–5.11)
RDW: 18 % — ABNORMAL HIGH (ref 11.5–15.5)
WBC Count: 6.4 K/uL (ref 4.0–10.5)
nRBC: 0 % (ref 0.0–0.2)

## 2024-06-13 LAB — CMP (CANCER CENTER ONLY)
ALT: 10 U/L (ref 0–44)
AST: 11 U/L — ABNORMAL LOW (ref 15–41)
Albumin: 4 g/dL (ref 3.5–5.0)
Alkaline Phosphatase: 101 U/L (ref 38–126)
Anion gap: 10 (ref 5–15)
BUN: 13 mg/dL (ref 8–23)
CO2: 24 mmol/L (ref 22–32)
Calcium: 10.7 mg/dL — ABNORMAL HIGH (ref 8.9–10.3)
Chloride: 106 mmol/L (ref 98–111)
Creatinine: 1.15 mg/dL — ABNORMAL HIGH (ref 0.44–1.00)
GFR, Estimated: 48 mL/min — ABNORMAL LOW (ref >60)
Glucose, Bld: 138 mg/dL — ABNORMAL HIGH (ref 70–99)
Potassium: 3.8 mmol/L (ref 3.5–5.1)
Sodium: 140 mmol/L (ref 135–145)
Total Bilirubin: 0.3 mg/dL (ref 0.0–1.2)
Total Protein: 7.6 g/dL (ref 6.5–8.1)

## 2024-06-13 LAB — TSH: TSH: 0.677 u[IU]/mL (ref 0.350–4.500)

## 2024-06-13 MED ORDER — SODIUM CHLORIDE 0.9 % IV SOLN
200.0000 mg | Freq: Once | INTRAVENOUS | Status: AC
  Administered 2024-06-13: 200 mg via INTRAVENOUS
  Filled 2024-06-13: qty 200

## 2024-06-13 MED ORDER — FAMOTIDINE IN NACL 20-0.9 MG/50ML-% IV SOLN
20.0000 mg | Freq: Once | INTRAVENOUS | Status: AC
  Administered 2024-06-13: 20 mg via INTRAVENOUS
  Filled 2024-06-13: qty 50

## 2024-06-13 MED ORDER — SODIUM CHLORIDE 0.9 % IV SOLN: INTRAVENOUS | Status: DC

## 2024-06-13 MED ORDER — SODIUM CHLORIDE 0.9 % IV SOLN
80.0000 mg/m2 | Freq: Once | INTRAVENOUS | Status: AC
  Administered 2024-06-13: 162 mg via INTRAVENOUS
  Filled 2024-06-13: qty 27

## 2024-06-13 MED ORDER — SODIUM CHLORIDE 0.9 % IV SOLN
390.0000 mg | Freq: Once | INTRAVENOUS | Status: AC
  Administered 2024-06-13: 390 mg via INTRAVENOUS
  Filled 2024-06-13: qty 39

## 2024-06-13 MED ORDER — DEXAMETHASONE SOD PHOSPHATE PF 10 MG/ML IJ SOLN
10.0000 mg | Freq: Once | INTRAMUSCULAR | Status: AC
  Administered 2024-06-13: 10 mg via INTRAVENOUS

## 2024-06-13 MED ORDER — SODIUM CHLORIDE 0.9 % IV SOLN
150.0000 mg | Freq: Once | INTRAVENOUS | Status: AC
  Administered 2024-06-13: 150 mg via INTRAVENOUS
  Filled 2024-06-13: qty 150

## 2024-06-13 MED ORDER — CETIRIZINE HCL 10 MG/ML IV SOLN
10.0000 mg | Freq: Once | INTRAVENOUS | Status: AC
  Administered 2024-06-13: 10 mg via INTRAVENOUS
  Filled 2024-06-13: qty 1

## 2024-06-13 MED ORDER — SODIUM CHLORIDE 0.9% FLUSH: 10.0000 mL | INTRAVENOUS | Status: DC | PRN

## 2024-06-13 MED ORDER — PALONOSETRON HCL INJECTION 0.25 MG/5ML
0.2500 mg | Freq: Once | INTRAVENOUS | Status: AC
  Administered 2024-06-13: 0.25 mg via INTRAVENOUS
  Filled 2024-06-13: qty 5

## 2024-06-13 NOTE — Assessment & Plan Note (Addendum)
 I will adjust the dose of chemotherapy according to her creatinine

## 2024-06-13 NOTE — Patient Instructions (Signed)

## 2024-06-13 NOTE — Assessment & Plan Note (Addendum)
 Recent vitamin B12 and iron studies are adequate Observe closely Likely due to side effects of treatment but she is not symptomatic

## 2024-06-13 NOTE — Progress Notes (Signed)
 [facility] OFFICE PROGRESS NOTE  Patient Care Team: [name], MD as PCP - General (Family Medicine) Michele Richardson, DO as PCP - Cardiology (Cardiology) Okey Arch, MD (Inactive) as Referring Physician (Obstetrics and Gynecology)  Assessment & Plan Endometrial cancer [facility]) The patient was diagnosed with endometrial cancer after an incidental finding noted on CT imaging She underwent complete hysterectomy, bilateral salpingo-oophorectomy, sentinel lymph node injection and biopsy and lysis of adhesions Final pathology: Mixed morphology, areas of endometrioid with some areas of mucinous features, MMR deficient, p53 wild-type, HER2/neu 0%  She tolerated cycle 1 of treatment well except for mild anemia and elevated blood pressure She tolerated cycle 2 of treatment without major problems except for elevated blood pressure and mild anemia We will proceed with cycle 3 without delay Stage 3a chronic kidney disease (HCC) I will adjust the dose of chemotherapy according to her creatinine Deficiency anemia Recent vitamin B12 and iron studies are adequate Observe closely Likely due to side effects of treatment but she is not symptomatic  No orders of the defined types were placed in this encounter.    Almarie Bedford, MD  INTERVAL HISTORY: she returns for treatment follow-up Complications related to previous cycle of chemotherapy included anemia,, elevated BP, and elevated serum creatinine Overall she tolerated treatment well No peripheral neuropathy  PHYSICAL EXAMINATION: ECOG PERFORMANCE STATUS: 1 - Symptomatic but completely ambulatory  No results found for: CAN125    Latest Ref Rng & Units 06/13/2024   10:14 AM 05/23/2024   11:54 AM 05/01/2024   12:53 PM  CBC  WBC 4.0 - 10.5 K/uL 6.4  7.2  7.8   Hemoglobin 12.0 - 15.0 g/dL 89.0  88.9  88.8   Hematocrit 36.0 - 46.0 % 33.4  33.9  34.4   Platelets 150 - 400 K/uL 237  247  261       Chemistry      Component  Value Date/Time   NA 140 06/13/2024 1014   NA 143 10/02/2022 0832   NA 140 06/01/2016 1143   K 3.8 06/13/2024 1014   K 4.0 06/01/2016 1143   CL 106 06/13/2024 1014   CL 105 12/09/2012 1423   CO2 24 06/13/2024 1014   CO2 26 06/01/2016 1143   BUN 13 06/13/2024 1014   BUN 15 10/02/2022 0832   BUN 12.5 06/01/2016 1143   CREATININE 1.15 (H) 06/13/2024 1014   CREATININE 1.1 06/01/2016 1143   GLU 100 06/30/2019 0000      Component Value Date/Time   CALCIUM  10.7 (H) 06/13/2024 1014   CALCIUM  10.1 06/01/2016 1143   ALKPHOS 101 06/13/2024 1014   ALKPHOS 69 06/01/2016 1143   AST 11 (L) 06/13/2024 1014   AST 11 06/01/2016 1143   ALT 10 06/13/2024 1014   ALT 12 06/01/2016 1143   BILITOT 0.3 06/13/2024 1014   BILITOT 0.28 06/01/2016 1143       Vitals:   06/13/24 1041  BP: (!) 167/95  Pulse: 85  Resp: 18  Temp: 97.7 F (36.5 C)  SpO2: 97%   Filed Weights   06/13/24 1041  Weight: 269 lb 12.8 oz (122.4 kg)   Other relevant data reviewed during this visit included CBC and CMP

## 2024-06-13 NOTE — Assessment & Plan Note (Addendum)
 The patient was diagnosed with endometrial cancer after an incidental finding noted on CT imaging She underwent complete hysterectomy, bilateral salpingo-oophorectomy, sentinel lymph node injection and biopsy and lysis of adhesions Final pathology: Mixed morphology, areas of endometrioid with some areas of mucinous features, MMR deficient, p53 wild-type, HER2/neu 0%  She tolerated cycle 1 of treatment well except for mild anemia and elevated blood pressure She tolerated cycle 2 of treatment without major problems except for elevated blood pressure and mild anemia We will proceed with cycle 3 without delay

## 2024-06-13 NOTE — Patient Instructions (Signed)
 [facility] - A DEPT OF [facility]  Discharge Instructions: Thank you for choosing [facility] to provide your oncology and hematology care.   If you have a lab appointment with the Cancer Center, please go directly to the Cancer Center and check in at the registration area.   Wear comfortable clothing and clothing appropriate for easy access to any Portacath or PICC line.   We strive to give you quality time with your provider. You may need to reschedule your appointment if you arrive late (15 or more minutes).  Arriving late affects you and other patients whose appointments are after yours.  Also, if you miss three or more appointments without notifying the office, you may be dismissed from the clinic at the provider's discretion.      For prescription refill requests, have your pharmacy contact our office and allow 72 hours for refills to be completed.    Today you received the following chemotherapy and/or immunotherapy agents: pembrolizumab, paclitaxel, and carboplatin      To help prevent nausea and vomiting after your treatment, we encourage you to take your nausea medication as directed.  BELOW ARE SYMPTOMS THAT SHOULD BE REPORTED IMMEDIATELY: *FEVER GREATER THAN 100.4 F (38 C) OR HIGHER *CHILLS OR SWEATING *NAUSEA AND VOMITING THAT IS NOT CONTROLLED WITH YOUR NAUSEA MEDICATION *UNUSUAL SHORTNESS OF BREATH *UNUSUAL BRUISING OR BLEEDING *URINARY PROBLEMS (pain or burning when urinating, or frequent urination) *BOWEL PROBLEMS (unusual diarrhea, constipation, pain near the anus) TENDERNESS IN MOUTH AND THROAT WITH OR WITHOUT PRESENCE OF ULCERS (sore throat, sores in mouth, or a toothache) UNUSUAL RASH, SWELLING OR PAIN  UNUSUAL VAGINAL DISCHARGE OR ITCHING   Items with * indicate a potential emergency and should be followed up as soon as possible or go to the Emergency Department if any problems should occur.  Please show the  CHEMOTHERAPY ALERT CARD or IMMUNOTHERAPY ALERT CARD at check-in to the Emergency Department and triage nurse.  Should you have questions after your visit or need to cancel or reschedule your appointment, please contact [facility] - A DEPT OF [facility]  Dept: [phone]  and follow the prompts.  Office hours are 8:00 a.m. to 4:30 p.m. Monday - Friday. Please note that voicemails left after 4:00 p.m. may not be returned until the following business day.  We are closed weekends and major holidays. You have access to a nurse at all times for urgent questions. Please call the main number to the clinic Dept: [phone] and follow the prompts.   For any non-urgent questions, you may also contact your provider using MyChart. We now offer e-Visits for anyone 7 and older to request care online for non-urgent symptoms. For details visit [email or web address].   Also download the MyChart app! Go to the app store, search MyChart, open the app, select [place], and log in with your MyChart username and password.

## 2024-06-14 LAB — T4: T4, Total: 7.3 ug/dL (ref 4.5–12.0)

## 2024-06-14 NOTE — Progress Notes (Signed)
  Radiation Oncology         [phone] ________________________________  Name: [name] MRN: [id number]  Date: 06/16/2024  DOB: 1946/05/30  CC: [name], MD  [name], MD  HDR BRACHYTHERAPY NOTE  DIAGNOSIS: Stage IIIA1 grade 2 endometrioid endometrial adenocarcinoma    Simple treatment device note: Patient had construction of her custom vaginal cylinder. She will be treated with a 3.0 cm diameter segmented cylinder. This conforms to her anatomy without undue discomfort.  Vaginal brachytherapy procedure node: The patient was brought to the HDR suite. Identity was confirmed. All relevant records and images related to the planned course of therapy were reviewed. The patient freely provided informed written consent to proceed with treatment after reviewing the details related to the planned course of therapy. The consent form was witnessed and verified by the simulation staff. Then, the patient was set-up in a stable reproducible supine position for radiation therapy. Pelvic exam revealed the vaginal cuff to be intact . The patient's custom vaginal cylinder was placed in the proximal vagina. This was affixed to the CT/MR stabilization plate to prevent slippage. Patient tolerated the placement well.  Verification simulation note:  A fiducial marker was placed within the vaginal cylinder. An AP and lateral film was then obtained through the pelvis area. This documented accurate position of the vaginal cylinder for treatment.  HDR BRACHYTHERAPY TREATMENT  The remote afterloading device was affixed to the vaginal cylinder by catheter. Patient then proceeded to undergo her fourth high-dose-rate treatment directed at the proximal vagina. The patient was prescribed a dose of 6.0 gray to be delivered to the mucosal surface. Treatment length was 3.0 cm. Patient was treated with 1 channel using 6 dwell positions. Treatment time was 244.1  seconds. Iridium 192 was the  high-dose-rate source for treatment. The patient tolerated the treatment well. After completion of her therapy, a radiation survey was performed documenting return of the iridium source into the [employer] safe.   PLAN: She will return next week to undergo her fifth high-dose-rate treatment.  ________________________________  -----------------------------------  Lynwood CHARM Nasuti, PhD, MD  This document serves as a record of services personally performed by Lynwood Nasuti, MD. It was created on his behalf by Dorthy Fuse, a trained medical scribe. The creation of this record is based on the scribe's personal observations and the provider's statements to them. This document has been checked and approved by the attending provider.

## 2024-06-16 ENCOUNTER — Other Ambulatory Visit: Payer: Self-pay

## 2024-06-16 ENCOUNTER — Ambulatory Visit
Admission: RE | Admit: 2024-06-16 | Discharge: 2024-06-16 | Disposition: A | Discharge status: Home / Self Care | Source: Ambulatory Visit | Attending: Radiation Oncology | Admitting: Radiation Oncology

## 2024-06-16 DIAGNOSIS — C541 Malignant neoplasm of endometrium: Secondary | ICD-10-CM | POA: Diagnosis not present

## 2024-06-16 LAB — RAD ONC ARIA SESSION SUMMARY
Course Elapsed Days: 20
Plan Fractions Treated to Date: 4
Plan Prescribed Dose Per Fraction: 6 Gy
Plan Total Fractions Prescribed: 5
Plan Total Prescribed Dose: 30 Gy
Reference Point Dosage Given to Date: 24 Gy
Reference Point Session Dosage Given: 6 Gy
Session Number: 4

## 2024-06-20 ENCOUNTER — Telehealth: Payer: Self-pay | Admitting: *Deleted

## 2024-06-20 NOTE — Telephone Encounter (Signed)
 CALLED PATIENT TO REMIND OF HDR TX. FOR 06-23-24 @ 3 PM, SPOKE WITH PATIENT AND SHE IS AWARE OF THIS TX.

## 2024-06-22 NOTE — Progress Notes (Signed)
  Radiation Oncology         (336) [phone] ________________________________  Name: [name] MRN: [id number]  Date: 06/23/2024  DOB: May 04, 1946  CC: [name], MD  [name], MD  HDR BRACHYTHERAPY NOTE  DIAGNOSIS: Stage IIIA1 grade 2 endometrioid endometrial adenocarcinoma    Simple treatment device note: Patient had construction of her custom vaginal cylinder. She will be treated with a 3.0 cm diameter segmented cylinder. This conforms to her anatomy without undue discomfort.  Vaginal brachytherapy procedure node: The patient was brought to the HDR suite. Identity was confirmed. All relevant records and images related to the planned course of therapy were reviewed. The patient freely provided informed written consent to proceed with treatment after reviewing the details related to the planned course of therapy. The consent form was witnessed and verified by the simulation staff. Then, the patient was set-up in a stable reproducible supine position for radiation therapy. Pelvic exam revealed the vaginal cuff to be intact . The patient's custom vaginal cylinder was placed in the proximal vagina. This was affixed to the CT/MR stabilization plate to prevent slippage. Patient tolerated the placement well.  Verification simulation note:  A fiducial marker was placed within the vaginal cylinder. An AP and lateral film was then obtained through the pelvis area. This documented accurate position of the vaginal cylinder for treatment.  HDR BRACHYTHERAPY TREATMENT  The remote afterloading device was affixed to the vaginal cylinder by catheter. Patient then proceeded to undergo her fifth high-dose-rate treatment directed at the proximal vagina. The patient was prescribed a dose of 6.0 gray to be delivered to the mucosal surface. Treatment length was 3.0 cm. Patient was treated with 1 channel using 6 dwell positions. Treatment time was 260.6 seconds. Iridium 192 was the  high-dose-rate source for treatment. The patient tolerated the treatment well. After completion of her therapy, a radiation survey was performed documenting return of the iridium source into the [employer] safe.   PLAN: She has received her fifth and final high-dose-rate treatment. She tolerated brachytherapy relatively well overall without any significant side effects reported . She will return here for a routine follow-up visit in 1 month.  ________________________________  -----------------------------------  Lynwood CHARM Nasuti, PhD, MD  This document serves as a record of services personally performed by Lynwood Nasuti, MD. It was created on his behalf by Dorthy Fuse, a trained medical scribe. The creation of this record is based on the scribe's personal observations and the provider's statements to them. This document has been checked and approved by the attending provider.

## 2024-06-23 ENCOUNTER — Ambulatory Visit
Admission: RE | Admit: 2024-06-23 | Discharge: 2024-06-23 | Disposition: A | Discharge status: Home / Self Care | Source: Ambulatory Visit | Attending: Radiation Oncology | Admitting: Radiation Oncology

## 2024-06-23 ENCOUNTER — Other Ambulatory Visit: Payer: Self-pay

## 2024-06-23 DIAGNOSIS — C541 Malignant neoplasm of endometrium: Secondary | ICD-10-CM | POA: Diagnosis not present

## 2024-06-23 LAB — RAD ONC ARIA SESSION SUMMARY
Course Elapsed Days: 27
Plan Fractions Treated to Date: 5
Plan Prescribed Dose Per Fraction: 6 Gy
Plan Total Fractions Prescribed: 5
Plan Total Prescribed Dose: 30 Gy
Reference Point Dosage Given to Date: 30 Gy
Reference Point Session Dosage Given: 6 Gy
Session Number: 5

## 2024-06-25 NOTE — Radiation Completion Notes (Addendum)
°  Radiation Oncology         [phone] ________________________________  Name: [name] MRN: [id number]  Date of Service: 06/23/2024  DOB: 05/06/1946  End of Treatment Note   Diagnosis: Stage IIIA1 grade 2 endometrioid endometrial adenocarcinoma  Intent: Curative     ==========DELIVERED PLANS==========  First Treatment Date: 2024-05-27 Last Treatment Date: 2024-06-23   Plan Name: VagCuff_[id number] Site: Vagina Technique: HDR Ir-192 Mode: Brachytherapy Dose Per Fraction: 6 Gy Prescribed Dose (Delivered / Prescribed): 30 Gy / 30 Gy Prescribed Fxs (Delivered / Prescribed): 5 / 5   ====================================   The patient tolerated radiation. She developed relatively well overall without any significant side effects reported.   The patient will return in one month and will continue follow up with Drs. Lonn and Gridley as well.      Ronita Due, PA-C

## 2024-07-02 MED FILL — Fosaprepitant Dimeglumine For IV Infusion 150 MG (Base Eq): INTRAVENOUS | Qty: 5 | Status: AC

## 2024-07-03 ENCOUNTER — Inpatient Hospital Stay: Attending: Gynecologic Oncology

## 2024-07-03 ENCOUNTER — Inpatient Hospital Stay

## 2024-07-03 ENCOUNTER — Encounter: Payer: Self-pay | Admitting: Hematology and Oncology

## 2024-07-03 ENCOUNTER — Inpatient Hospital Stay: Admitting: Hematology and Oncology

## 2024-07-03 VITALS — BP 146/86 | HR 72 | Temp 98.1°F | Resp 20 | Ht 66.0 in | Wt 270.8 lb

## 2024-07-03 DIAGNOSIS — C541 Malignant neoplasm of endometrium: Secondary | ICD-10-CM

## 2024-07-03 DIAGNOSIS — D649 Anemia, unspecified: Secondary | ICD-10-CM | POA: Insufficient documentation

## 2024-07-03 DIAGNOSIS — Z79899 Other long term (current) drug therapy: Secondary | ICD-10-CM | POA: Insufficient documentation

## 2024-07-03 DIAGNOSIS — D539 Nutritional anemia, unspecified: Secondary | ICD-10-CM

## 2024-07-03 DIAGNOSIS — Z5111 Encounter for antineoplastic chemotherapy: Secondary | ICD-10-CM | POA: Diagnosis present

## 2024-07-03 DIAGNOSIS — N1831 Chronic kidney disease, stage 3a: Secondary | ICD-10-CM | POA: Diagnosis not present

## 2024-07-03 DIAGNOSIS — G629 Polyneuropathy, unspecified: Secondary | ICD-10-CM | POA: Insufficient documentation

## 2024-07-03 LAB — CMP (CANCER CENTER ONLY)
ALT: 13 U/L (ref 0–44)
AST: 19 U/L (ref 15–41)
Albumin: 4.2 g/dL (ref 3.5–5.0)
Alkaline Phosphatase: 118 U/L (ref 38–126)
Anion gap: 14 (ref 5–15)
BUN: 11 mg/dL (ref 8–23)
CO2: 22 mmol/L (ref 22–32)
Calcium: 10.9 mg/dL — ABNORMAL HIGH (ref 8.9–10.3)
Chloride: 104 mmol/L (ref 98–111)
Creatinine: 1.15 mg/dL — ABNORMAL HIGH (ref 0.44–1.00)
GFR, Estimated: 48 mL/min — ABNORMAL LOW (ref >60)
Glucose, Bld: 173 mg/dL — ABNORMAL HIGH (ref 70–99)
Potassium: 4.3 mmol/L (ref 3.5–5.1)
Sodium: 140 mmol/L (ref 135–145)
Total Bilirubin: 0.3 mg/dL (ref 0.0–1.2)
Total Protein: 7.8 g/dL (ref 6.5–8.1)

## 2024-07-03 LAB — CBC WITH DIFFERENTIAL (CANCER CENTER ONLY)
Abs Immature Granulocytes: 0.04 K/uL (ref 0.00–0.07)
Basophils Absolute: 0 K/uL (ref 0.0–0.1)
Basophils Relative: 0 %
Eosinophils Absolute: 0 K/uL (ref 0.0–0.5)
Eosinophils Relative: 0 %
HCT: 33.3 % — ABNORMAL LOW (ref 36.0–46.0)
Hemoglobin: 10.8 g/dL — ABNORMAL LOW (ref 12.0–15.0)
Immature Granulocytes: 1 %
Lymphocytes Relative: 16 %
Lymphs Abs: 0.8 K/uL (ref 0.7–4.0)
MCH: 24.5 pg — ABNORMAL LOW (ref 26.0–34.0)
MCHC: 32.4 g/dL (ref 30.0–36.0)
MCV: 75.7 fL — ABNORMAL LOW (ref 80.0–100.0)
Monocytes Absolute: 0.2 K/uL (ref 0.1–1.0)
Monocytes Relative: 4 %
Neutro Abs: 4.1 K/uL (ref 1.7–7.7)
Neutrophils Relative %: 79 %
Platelet Count: 224 K/uL (ref 150–400)
RBC: 4.4 MIL/uL (ref 3.87–5.11)
RDW: 19 % — ABNORMAL HIGH (ref 11.5–15.5)
WBC Count: 5.2 K/uL (ref 4.0–10.5)
nRBC: 0 % (ref 0.0–0.2)

## 2024-07-03 MED ORDER — SODIUM CHLORIDE 0.9 % IV SOLN
387.5000 mg | Freq: Once | INTRAVENOUS | Status: AC
  Administered 2024-07-03: 390 mg via INTRAVENOUS
  Filled 2024-07-03: qty 39

## 2024-07-03 MED ORDER — SODIUM CHLORIDE 0.9 % IV SOLN
150.0000 mg | Freq: Once | INTRAVENOUS | Status: AC
  Administered 2024-07-03: 150 mg via INTRAVENOUS
  Filled 2024-07-03: qty 150

## 2024-07-03 MED ORDER — PALONOSETRON HCL INJECTION 0.25 MG/5ML
0.2500 mg | Freq: Once | INTRAVENOUS | Status: AC
  Administered 2024-07-03: 0.25 mg via INTRAVENOUS
  Filled 2024-07-03: qty 5

## 2024-07-03 MED ORDER — SODIUM CHLORIDE 0.9 % IV SOLN
80.0000 mg/m2 | Freq: Once | INTRAVENOUS | Status: AC
  Administered 2024-07-03: 162 mg via INTRAVENOUS
  Filled 2024-07-03: qty 27

## 2024-07-03 MED ORDER — SODIUM CHLORIDE 0.9 % IV SOLN
200.0000 mg | Freq: Once | INTRAVENOUS | Status: AC
  Administered 2024-07-03: 200 mg via INTRAVENOUS
  Filled 2024-07-03: qty 200

## 2024-07-03 MED ORDER — SODIUM CHLORIDE 0.9 % IV SOLN: INTRAVENOUS | Status: DC

## 2024-07-03 MED ORDER — DEXAMETHASONE SOD PHOSPHATE PF 10 MG/ML IJ SOLN
10.0000 mg | Freq: Once | INTRAMUSCULAR | Status: AC
  Administered 2024-07-03: 10 mg via INTRAVENOUS

## 2024-07-03 MED ORDER — CETIRIZINE HCL 10 MG/ML IV SOLN
10.0000 mg | Freq: Once | INTRAVENOUS | Status: AC
  Administered 2024-07-03: 10 mg via INTRAVENOUS
  Filled 2024-07-03: qty 1

## 2024-07-03 MED ORDER — FAMOTIDINE IN NACL 20-0.9 MG/50ML-% IV SOLN
20.0000 mg | Freq: Once | INTRAVENOUS | Status: AC
  Administered 2024-07-03: 20 mg via INTRAVENOUS
  Filled 2024-07-03: qty 50

## 2024-07-03 NOTE — Assessment & Plan Note (Addendum)
 The patient was diagnosed with endometrial cancer after an incidental finding noted on CT imaging She underwent complete hysterectomy, bilateral salpingo-oophorectomy, sentinel lymph node injection and biopsy and lysis of adhesions Final pathology: Mixed morphology, areas of endometrioid with some areas of mucinous features, MMR deficient, p53 wild-type, HER2/neu 0%  She tolerated cycle 1 of treatment well except for mild anemia and elevated blood pressure She tolerated cycle 2 of treatment without major problems except for elevated blood pressure and mild anemia Cycle 3 of treatment was tolerated well except for mild anemia Overall, she has no major side effects/symptoms from treatment We will proceed with cycle 4 without delay

## 2024-07-03 NOTE — Assessment & Plan Note (Addendum)
 I will adjust the dose of chemotherapy according to her creatinine

## 2024-07-03 NOTE — Assessment & Plan Note (Addendum)
 Recent vitamin B12 and iron studies are adequate Observe closely Likely due to side effects of treatment but she is not symptomatic

## 2024-07-03 NOTE — Progress Notes (Signed)
 [facility] OFFICE PROGRESS NOTE  Patient Care Team: [name], MD as PCP - General (Family Medicine) Michele Richardson, DO as PCP - Cardiology (Cardiology) Okey Arch, MD (Inactive) as Referring Physician (Obstetrics and Gynecology)  Assessment & Plan Endometrial cancer [facility]) The patient was diagnosed with endometrial cancer after an incidental finding noted on CT imaging She underwent complete hysterectomy, bilateral salpingo-oophorectomy, sentinel lymph node injection and biopsy and lysis of adhesions Final pathology: Mixed morphology, areas of endometrioid with some areas of mucinous features, MMR deficient, p53 wild-type, HER2/neu 0%  She tolerated cycle 1 of treatment well except for mild anemia and elevated blood pressure She tolerated cycle 2 of treatment without major problems except for elevated blood pressure and mild anemia Cycle 3 of treatment was tolerated well except for mild anemia Overall, she has no major side effects/symptoms from treatment We will proceed with cycle 4 without delay Stage 3a chronic kidney disease (HCC) I will adjust the dose of chemotherapy according to her creatinine Deficiency anemia Recent vitamin B12 and iron studies are adequate Observe closely Likely due to side effects of treatment but she is not symptomatic  No orders of the defined types were placed in this encounter.    Almarie Bedford, MD  INTERVAL HISTORY: she returns for treatment follow-up Complications related to previous cycle of chemotherapy included anemia, and elevated serum creatinine She had pre-existing neuropathy but denies worsening neuropathy while on treatment PHYSICAL EXAMINATION: ECOG PERFORMANCE STATUS: 1 - Symptomatic but completely ambulatory  No results found for: CAN125    Latest Ref Rng & Units 07/03/2024   10:10 AM 06/13/2024   10:14 AM 05/23/2024   11:54 AM  CBC  WBC 4.0 - 10.5 K/uL 5.2  6.4  7.2   Hemoglobin 12.0 - 15.0 g/dL 89.1   89.0  88.9   Hematocrit 36.0 - 46.0 % 33.3  33.4  33.9   Platelets 150 - 400 K/uL 224  237  247       Chemistry      Component Value Date/Time   NA 140 07/03/2024 1010   NA 143 10/02/2022 0832   NA 140 06/01/2016 1143   K 4.3 07/03/2024 1010   K 4.0 06/01/2016 1143   CL 104 07/03/2024 1010   CL 105 12/09/2012 1423   CO2 22 07/03/2024 1010   CO2 26 06/01/2016 1143   BUN 11 07/03/2024 1010   BUN 15 10/02/2022 0832   BUN 12.5 06/01/2016 1143   CREATININE 1.15 (H) 07/03/2024 1010   CREATININE 1.1 06/01/2016 1143   GLU 100 06/30/2019 0000      Component Value Date/Time   CALCIUM  10.9 (H) 07/03/2024 1010   CALCIUM  10.1 06/01/2016 1143   ALKPHOS 118 07/03/2024 1010   ALKPHOS 69 06/01/2016 1143   AST 19 07/03/2024 1010   AST 11 06/01/2016 1143   ALT 13 07/03/2024 1010   ALT 12 06/01/2016 1143   BILITOT 0.3 07/03/2024 1010   BILITOT 0.28 06/01/2016 1143       There were no vitals filed for this visit. There were no vitals filed for this visit. Other relevant data reviewed during this visit included CBC and CMP

## 2024-07-15 NOTE — Addendum Note (Signed)
 Encounter addended by: Wyatt Leeroy HERO, PA-C on: 07/15/2024 8:38 AM  Actions taken: Clinical Note Signed

## 2024-07-18 ENCOUNTER — Encounter: Payer: Self-pay | Admitting: Radiation Oncology

## 2024-07-20 NOTE — Progress Notes (Signed)
 "  Radiation Oncology         (336) [phone] ________________________________  Name: [name] MRN: [id number]  Date: 07/21/2024  DOB: 10-Dec-1945  Follow-Up Visit Note  CC: [name] Elsie SAUNDERS, MD  [name] Comer SAUNDERS, MD  No diagnosis found.  Diagnosis: Stage IIIA1 grade 2 endometrioid endometrial adenocarcinoma   Interval Since Last Radiation: approximately 1 month   Intent: Curative  Radiation Treatment Dates: First Treatment Date: 2024-05-27 -- Last Treatment Date: 2024-06-23 Site/Dose/Technique/Mode:  Plan Name: VagCuff_Fx1-5 Site: Vagina Technique: HDR Ir-192 Mode: Brachytherapy Dose Per Fraction: 6 Gy Prescribed Dose (Delivered / Prescribed): 30 Gy / 30 Gy Prescribed Fxs (Delivered / Prescribed): 5 / 5  Narrative:  The patient returns today for routine follow-up after completing radiation therapy last month. She tolerated radiation therapy relatively well overall without any significant side effects reported.  To review, she received her first dose of chemotherapy with paclitaxel  on 05/02/24 under the care of Dr. Lonn. Chemo toxicities encountered by the patient thus far have included mild anemia and hypertension. She has otherwise tolerated chemotherapy quite well and received her most recent (4th) cycle on 07/03/24.   No other significant interval history since the patient completed radiation therapy, or in the interval since she began radiation therapy.   ***                              Allergies:  is allergic to lyrica  [pregabalin ], cymbalta [duloxetine hcl], gabapentin , lisinopril , maxzide [hydrochlorothiazide -triamterene], niacin , pravastatin sodium, simvastatin , and lodine [etodolac].  Meds: Current Outpatient Medications  Medication Sig Dispense Refill   acetaminophen  (TYLENOL ) 500 MG tablet Take 1,000 mg by mouth daily as needed for headache, mild pain (pain score 1-3) or moderate pain (pain score 4-6).     aspirin  EC 81 MG tablet Take 81 mg by mouth  daily. Swallow whole.     atorvastatin  (LIPITOR) 20 MG tablet TAKE 1 TABLET(20 MG) BY MOUTH AT BEDTIME 90 tablet 3   Cholecalciferol  (VITAMIN D -3) 25 MCG (1000 UT) CAPS Take 1 capsule by mouth daily.     clotrimazole-betamethasone  (LOTRISONE) cream Apply 1 Application topically 2 (two) times daily.     dexamethasone  (DECADRON ) 4 MG tablet Take 2 tabs at the night before and 2 tab the morning of chemotherapy, every 3 weeks, by mouth x 6 cycles 24 tablet 6   escitalopram  (LEXAPRO ) 20 MG tablet Take 20 mg by mouth daily.     ezetimibe  (ZETIA ) 10 MG tablet Take 10 mg by mouth at bedtime.     Glycerin-Polysorbate 80 (REFRESH DRY EYE THERAPY OP) Place 1 drop into both eyes in the morning and at bedtime.     hydrALAZINE (APRESOLINE) 25 MG tablet Take 25 mg by mouth daily.     hydrochlorothiazide  (HYDRODIURIL ) 25 MG tablet Take 1 tablet (25 mg total) by mouth daily. 30 tablet 0   hydrOXYzine (ATARAX) 25 MG tablet Take 25 mg by mouth in the morning and at bedtime.     irbesartan  (AVAPRO ) 300 MG tablet Take 1 tablet (300 mg total) by mouth at bedtime. (Patient taking differently: Take 300 mg by mouth daily.) 30 tablet 0   lidocaine -prilocaine  (EMLA ) cream Apply to affected area once 30 g 3   metFORMIN  (GLUCOPHAGE ) 500 MG tablet Take 1 tablet (500 mg total) by mouth daily with breakfast. 90 tablet 0   nystatin powder Apply 1 Application topically 2 (two) times daily.     ondansetron  (ZOFRAN )  8 MG tablet Take 1 tablet (8 mg total) by mouth every 8 (eight) hours as needed for nausea or vomiting. Start on the third day after carboplatin . 30 tablet 1   polyethylene glycol (MIRALAX  / GLYCOLAX ) 17 g packet Take 17 g by mouth daily.     prochlorperazine  (COMPAZINE ) 10 MG tablet Take 1 tablet (10 mg total) by mouth every 6 (six) hours as needed for nausea or vomiting. 30 tablet 1   verapamil  (VERELAN  PM) 240 MG 24 hr capsule Take 240 mg by mouth at bedtime.     No current facility-administered medications for this  encounter.    Physical Findings: The patient is in no acute distress. Patient is alert and oriented.  vitals were not taken for this visit. .  No significant changes. Lungs are clear to auscultation bilaterally. Heart has regular rate and rhythm. No palpable cervical, supraclavicular, or axillary adenopathy. Abdomen soft, non-tender, normal bowel sounds.   Lab Findings: Lab Results  Component Value Date   WBC 5.2 07/03/2024   HGB 10.8 (L) 07/03/2024   HCT 33.3 (L) 07/03/2024   MCV 75.7 (L) 07/03/2024   PLT 224 07/03/2024    Radiographic Findings: No results found.  Impression: Stage IIIA1 grade 2 endometrioid endometrial adenocarcinoma   The patient is recovering from the effects of radiation.  ***  Plan:  ***   *** minutes of total time was spent for this patient encounter, including preparation, face-to-face counseling with the patient and coordination of care, physical exam, and documentation of the encounter. ____________________________________  Lynwood CHARM Nasuti, PhD, MD  This document serves as a record of services personally performed by Lynwood Nasuti, MD. It was created on his behalf by Dorthy Fuse, a trained medical scribe. The creation of this record is based on the scribe's personal observations and the provider's statements to them. This document has been checked and approved by the attending provider.   "

## 2024-07-21 ENCOUNTER — Ambulatory Visit
Admission: RE | Admit: 2024-07-21 | Discharge: 2024-07-21 | Disposition: A | Discharge status: Home / Self Care | Source: Ambulatory Visit | Attending: Radiation Oncology | Admitting: Radiation Oncology

## 2024-07-21 ENCOUNTER — Telehealth: Payer: Self-pay | Admitting: *Deleted

## 2024-07-21 ENCOUNTER — Encounter: Payer: Self-pay | Admitting: Radiation Oncology

## 2024-07-21 VITALS — BP 173/83

## 2024-07-21 DIAGNOSIS — Z9221 Personal history of antineoplastic chemotherapy: Secondary | ICD-10-CM | POA: Diagnosis not present

## 2024-07-21 DIAGNOSIS — C541 Malignant neoplasm of endometrium: Secondary | ICD-10-CM | POA: Insufficient documentation

## 2024-07-21 DIAGNOSIS — R1030 Lower abdominal pain, unspecified: Secondary | ICD-10-CM | POA: Diagnosis not present

## 2024-07-21 DIAGNOSIS — Z7984 Long term (current) use of oral hypoglycemic drugs: Secondary | ICD-10-CM | POA: Diagnosis not present

## 2024-07-21 DIAGNOSIS — Z79899 Other long term (current) drug therapy: Secondary | ICD-10-CM | POA: Diagnosis not present

## 2024-07-21 DIAGNOSIS — Z923 Personal history of irradiation: Secondary | ICD-10-CM | POA: Diagnosis not present

## 2024-07-21 DIAGNOSIS — Z7982 Long term (current) use of aspirin: Secondary | ICD-10-CM | POA: Insufficient documentation

## 2024-07-21 DIAGNOSIS — R030 Elevated blood-pressure reading, without diagnosis of hypertension: Secondary | ICD-10-CM | POA: Diagnosis not present

## 2024-07-21 NOTE — Telephone Encounter (Signed)
 [name] from radiation sent in basket message regarding the patient follow up appt with [facility]YN [facility]. Patient scheduled to see Dr Viktoria on 2/26 at 4 pm. Patient received appt on her AVS from Dr Colton appt

## 2024-07-21 NOTE — Progress Notes (Signed)
 [name] is here today for follow up post radiation to the pelvis.  They completed their radiation on: 06/23/24   Does the patient complain of any of the following:  Pain: Reports intermittent sharp pain to left groin that does not last long.  Abdominal bloating: No Diarrhea/Constipation: Constipation. Patient taking Miralax  Nausea/Vomiting: No Vaginal Discharge: No Blood in Urine or Stool: No Urinary Issues (dysuria/incomplete emptying/ incontinence/ increased frequency/urgency): No Does patient report using vaginal dilator 2-3 times a week and/or sexually active 2-3 weeks:  Patient given S and S+ vaginal dilators with instructions and importance of use. Patient voiced understanding.  Post radiation skin changes: Patient reports having a vaginal itch.    Additional comments if applicable:   BP (!) (P) 173/83 (Patient Position: Sitting)   Temp (!) (P) 97.5 F (36.4 C) (Temporal)   Resp (P) 20   Ht (P) 5' 6 (1.676 m)   Wt (P) 269 lb 8 oz (122.2 kg)   SpO2 (P) 97%   BMI (P) 43.50 kg/m

## 2024-07-22 ENCOUNTER — Other Ambulatory Visit: Payer: Self-pay

## 2024-07-22 ENCOUNTER — Encounter: Payer: Self-pay | Admitting: Hematology and Oncology

## 2024-07-31 ENCOUNTER — Encounter: Payer: Self-pay | Admitting: Hematology and Oncology

## 2024-08-01 ENCOUNTER — Encounter: Payer: Self-pay | Admitting: Hematology and Oncology

## 2024-08-01 ENCOUNTER — Inpatient Hospital Stay (HOSPITAL_BASED_OUTPATIENT_CLINIC_OR_DEPARTMENT_OTHER): Admitting: Hematology and Oncology

## 2024-08-01 ENCOUNTER — Inpatient Hospital Stay: Attending: Gynecologic Oncology

## 2024-08-01 VITALS — BP 185/95 | HR 78 | Temp 98.3°F | Resp 18 | Ht 66.0 in | Wt 270.2 lb

## 2024-08-01 DIAGNOSIS — M4310 Spondylolisthesis, site unspecified: Secondary | ICD-10-CM

## 2024-08-01 DIAGNOSIS — N1831 Chronic kidney disease, stage 3a: Secondary | ICD-10-CM | POA: Diagnosis not present

## 2024-08-01 DIAGNOSIS — C541 Malignant neoplasm of endometrium: Secondary | ICD-10-CM | POA: Insufficient documentation

## 2024-08-01 DIAGNOSIS — Z90710 Acquired absence of both cervix and uterus: Secondary | ICD-10-CM | POA: Diagnosis not present

## 2024-08-01 DIAGNOSIS — Z90722 Acquired absence of ovaries, bilateral: Secondary | ICD-10-CM | POA: Insufficient documentation

## 2024-08-01 DIAGNOSIS — E210 Primary hyperparathyroidism: Secondary | ICD-10-CM | POA: Insufficient documentation

## 2024-08-01 DIAGNOSIS — Z79899 Other long term (current) drug therapy: Secondary | ICD-10-CM | POA: Insufficient documentation

## 2024-08-01 DIAGNOSIS — D539 Nutritional anemia, unspecified: Secondary | ICD-10-CM | POA: Diagnosis not present

## 2024-08-01 DIAGNOSIS — Z5111 Encounter for antineoplastic chemotherapy: Secondary | ICD-10-CM | POA: Diagnosis present

## 2024-08-01 LAB — CBC WITH DIFFERENTIAL (CANCER CENTER ONLY)
Abs Immature Granulocytes: 0.03 K/uL (ref 0.00–0.07)
Basophils Absolute: 0 K/uL (ref 0.0–0.1)
Basophils Relative: 0 %
Eosinophils Absolute: 0 K/uL (ref 0.0–0.5)
Eosinophils Relative: 0 %
HCT: 32.1 % — ABNORMAL LOW (ref 36.0–46.0)
Hemoglobin: 10.5 g/dL — ABNORMAL LOW (ref 12.0–15.0)
Immature Granulocytes: 1 %
Lymphocytes Relative: 16 %
Lymphs Abs: 0.9 K/uL (ref 0.7–4.0)
MCH: 24.8 pg — ABNORMAL LOW (ref 26.0–34.0)
MCHC: 32.7 g/dL (ref 30.0–36.0)
MCV: 75.9 fL — ABNORMAL LOW (ref 80.0–100.0)
Monocytes Absolute: 0.2 K/uL (ref 0.1–1.0)
Monocytes Relative: 3 %
Neutro Abs: 4.6 K/uL (ref 1.7–7.7)
Neutrophils Relative %: 80 %
Platelet Count: 201 K/uL (ref 150–400)
RBC: 4.23 MIL/uL (ref 3.87–5.11)
RDW: 20.4 % — ABNORMAL HIGH (ref 11.5–15.5)
WBC Count: 5.7 K/uL (ref 4.0–10.5)
nRBC: 0 % (ref 0.0–0.2)

## 2024-08-01 LAB — CMP (CANCER CENTER ONLY)
ALT: 13 U/L (ref 0–44)
AST: 21 U/L (ref 15–41)
Albumin: 4.3 g/dL (ref 3.5–5.0)
Alkaline Phosphatase: 120 U/L (ref 38–126)
Anion gap: 16 — ABNORMAL HIGH (ref 5–15)
BUN: 14 mg/dL (ref 8–23)
CO2: 21 mmol/L — ABNORMAL LOW (ref 22–32)
Calcium: 10.8 mg/dL — ABNORMAL HIGH (ref 8.9–10.3)
Chloride: 105 mmol/L (ref 98–111)
Creatinine: 1.28 mg/dL — ABNORMAL HIGH (ref 0.44–1.00)
GFR, Estimated: 42 mL/min — ABNORMAL LOW
Glucose, Bld: 140 mg/dL — ABNORMAL HIGH (ref 70–99)
Potassium: 3.9 mmol/L (ref 3.5–5.1)
Sodium: 142 mmol/L (ref 135–145)
Total Bilirubin: 0.4 mg/dL (ref 0.0–1.2)
Total Protein: 8 g/dL (ref 6.5–8.1)

## 2024-08-01 MED ORDER — SODIUM CHLORIDE 0.9 % IV SOLN
360.0000 mg | Freq: Once | INTRAVENOUS | Status: AC
  Administered 2024-08-01: 360 mg via INTRAVENOUS
  Filled 2024-08-01: qty 36

## 2024-08-01 MED ORDER — PALONOSETRON HCL INJECTION 0.25 MG/5ML
0.2500 mg | Freq: Once | INTRAVENOUS | Status: AC
  Administered 2024-08-01: 0.25 mg via INTRAVENOUS
  Filled 2024-08-01: qty 5

## 2024-08-01 MED ORDER — DEXAMETHASONE SOD PHOSPHATE PF 10 MG/ML IJ SOLN
10.0000 mg | Freq: Once | INTRAMUSCULAR | Status: AC
  Administered 2024-08-01: 10 mg via INTRAVENOUS

## 2024-08-01 MED ORDER — SODIUM CHLORIDE 0.9 % IV SOLN: INTRAVENOUS | Status: DC

## 2024-08-01 MED ORDER — FAMOTIDINE IN NACL 20-0.9 MG/50ML-% IV SOLN
20.0000 mg | Freq: Once | INTRAVENOUS | Status: AC
  Administered 2024-08-01: 20 mg via INTRAVENOUS
  Filled 2024-08-01: qty 50

## 2024-08-01 MED ORDER — SODIUM CHLORIDE 0.9 % IV SOLN
200.0000 mg | Freq: Once | INTRAVENOUS | Status: AC
  Administered 2024-08-01: 200 mg via INTRAVENOUS
  Filled 2024-08-01: qty 200

## 2024-08-01 MED ORDER — SODIUM CHLORIDE 0.9 % IV SOLN
150.0000 mg | Freq: Once | INTRAVENOUS | Status: AC
  Administered 2024-08-01: 150 mg via INTRAVENOUS
  Filled 2024-08-01: qty 150

## 2024-08-01 MED ORDER — SODIUM CHLORIDE 0.9 % IV SOLN
80.0000 mg/m2 | Freq: Once | INTRAVENOUS | Status: AC
  Administered 2024-08-01: 162 mg via INTRAVENOUS
  Filled 2024-08-01: qty 27

## 2024-08-01 MED ORDER — CETIRIZINE HCL 10 MG/ML IV SOLN
10.0000 mg | Freq: Once | INTRAVENOUS | Status: AC
  Administered 2024-08-01: 10 mg via INTRAVENOUS
  Filled 2024-08-01: qty 1

## 2024-08-01 NOTE — Progress Notes (Signed)
 [facility] OFFICE PROGRESS NOTE  [name] Care Team: [name], MD as PCP - General (Family Medicine) Michele Richardson, DO as PCP - Cardiology (Cardiology) Okey Arch, MD (Inactive) as Referring Physician (Obstetrics and Gynecology)  Assessment & Plan Endometrial cancer [facility]) [name] was diagnosed with endometrial cancer after an incidental finding noted on CT imaging She underwent complete hysterectomy, bilateral salpingo-oophorectomy, sentinel lymph node injection and biopsy and lysis of adhesions Final pathology: Mixed morphology, areas of endometrioid with some areas of mucinous features, MMR deficient, p53 wild-type, HER2/neu 0%  She tolerated cycle 1 of treatment well except for mild anemia and elevated blood pressure She tolerated cycle 2 of treatment without major problems except for elevated blood pressure and mild anemia Cycle 3 of treatment was tolerated well except for mild anemia Cycle 4 of chemotherapy was complicated by hypertension, anemia and mild neuropathy We will proceed with cycle 5 without delay I plan to repeat imaging study after cycle 6 of treatment Stage 3a chronic kidney disease (HCC) I will adjust [name] dose of chemotherapy according to her creatinine Deficiency anemia Recent vitamin B12 and iron studies are adequate Observe closely Likely due to side effects of treatment but she is not symptomatic Spondylolisthesis, acquired She has chronic neuropathy due to her chronic back issues and had pre-existing neuropathy before we started treatment Observe only Her neuropathy is not worse while on treatment  No orders of [name] defined types were placed in this encounter.    Almarie Bedford, MD  INTERVAL HISTORY: she returns for treatment follow-up Complications related to previous cycle of chemotherapy included anemia,, peripheral neuropathy,, and elevated serum creatinine  PHYSICAL EXAMINATION: ECOG PERFORMANCE STATUS: 1 - Symptomatic but  completely ambulatory  No results found for: CAN125    Latest Ref Rng & Units 08/01/2024   10:05 AM 07/03/2024   10:10 AM 06/13/2024   10:14 AM  CBC  WBC 4.0 - 10.5 K/uL 5.7  5.2  6.4   Hemoglobin 12.0 - 15.0 g/dL 89.4  89.1  89.0   Hematocrit 36.0 - 46.0 % 32.1  33.3  33.4   Platelets 150 - 400 K/uL 201  224  237       Chemistry      Component Value Date/Time   NA 142 08/01/2024 1005   NA 143 10/02/2022 0832   NA 140 06/01/2016 1143   K 3.9 08/01/2024 1005   K 4.0 06/01/2016 1143   CL 105 08/01/2024 1005   CL 105 12/09/2012 1423   CO2 21 (L) 08/01/2024 1005   CO2 26 06/01/2016 1143   BUN 14 08/01/2024 1005   BUN 15 10/02/2022 0832   BUN 12.5 06/01/2016 1143   CREATININE 1.28 (H) 08/01/2024 1005   CREATININE 1.1 06/01/2016 1143   GLU 100 06/30/2019 0000      Component Value Date/Time   CALCIUM  10.8 (H) 08/01/2024 1005   CALCIUM  10.1 06/01/2016 1143   ALKPHOS 120 08/01/2024 1005   ALKPHOS 69 06/01/2016 1143   AST 21 08/01/2024 1005   AST 11 06/01/2016 1143   ALT 13 08/01/2024 1005   ALT 12 06/01/2016 1143   BILITOT 0.4 08/01/2024 1005   BILITOT 0.28 06/01/2016 1143       Vitals:   08/01/24 1032  BP: (!) 185/95  Pulse: 78  Resp: 18  Temp: 98.3 F (36.8 C)  SpO2: 100%   Filed Weights   08/01/24 1032  Weight: 270 lb 3.2 oz (122.6 kg)   Other relevant  data reviewed during this visit included CBC and CMP She denies worsening neuropathy

## 2024-08-01 NOTE — Patient Instructions (Signed)
 [facility] - A DEPT OF [facility]  Discharge Instructions: Thank you for choosing [facility] to provide your oncology and hematology care.   If you have a lab appointment with the Cancer Center, please go directly to the Cancer Center and check in at the registration area.   Wear comfortable clothing and clothing appropriate for easy access to any Portacath or PICC line.   We strive to give you quality time with your provider. You may need to reschedule your appointment if you arrive late (15 or more minutes).  Arriving late affects you and other patients whose appointments are after yours.  Also, if you miss three or more appointments without notifying the office, you may be dismissed from the clinic at the provider's discretion.      For prescription refill requests, have your pharmacy contact our office and allow 72 hours for refills to be completed.    Today you received the following chemotherapy and/or immunotherapy agents: Pembrolizumab  (Keytruda ), Paclitaxel  (Taxol ), & Carboplatin  (Paraplatin )   To help prevent nausea and vomiting after your treatment, we encourage you to take your nausea medication as directed.  BELOW ARE SYMPTOMS THAT SHOULD BE REPORTED IMMEDIATELY: *FEVER GREATER THAN 100.4 F (38 C) OR HIGHER *CHILLS OR SWEATING *NAUSEA AND VOMITING THAT IS NOT CONTROLLED WITH YOUR NAUSEA MEDICATION *UNUSUAL SHORTNESS OF BREATH *UNUSUAL BRUISING OR BLEEDING *URINARY PROBLEMS (pain or burning when urinating, or frequent urination) *BOWEL PROBLEMS (unusual diarrhea, constipation, pain near the anus) TENDERNESS IN MOUTH AND THROAT WITH OR WITHOUT PRESENCE OF ULCERS (sore throat, sores in mouth, or a toothache) UNUSUAL RASH, SWELLING OR PAIN  UNUSUAL VAGINAL DISCHARGE OR ITCHING   Items with * indicate a potential emergency and should be followed up as soon as possible or go to the Emergency Department if any problems should  occur.  Please show the CHEMOTHERAPY ALERT CARD or IMMUNOTHERAPY ALERT CARD at check-in to the Emergency Department and triage nurse.  Should you have questions after your visit or need to cancel or reschedule your appointment, please contact [facility] - A DEPT OF [facility]  Dept: [phone]  and follow the prompts.  Office hours are 8:00 a.m. to 4:30 p.m. Monday - Friday. Please note that voicemails left after 4:00 p.m. may not be returned until the following business day.  We are closed weekends and major holidays. You have access to a nurse at all times for urgent questions. Please call the main number to the clinic Dept: [phone] and follow the prompts.   For any non-urgent questions, you may also contact your provider using MyChart. We now offer e-Visits for anyone 51 and older to request care online for non-urgent symptoms. For details visit [email or web address].   Also download the MyChart app! Go to the app store, search MyChart, open the app, select [place], and log in with your MyChart username and password.

## 2024-08-01 NOTE — Assessment & Plan Note (Addendum)
 I will adjust the dose of chemotherapy according to her creatinine

## 2024-08-01 NOTE — Assessment & Plan Note (Addendum)
 Recent vitamin B12 and iron studies are adequate Observe closely Likely due to side effects of treatment but she is not symptomatic

## 2024-08-01 NOTE — Assessment & Plan Note (Addendum)
 The patient was diagnosed with endometrial cancer after an incidental finding noted on CT imaging She underwent complete hysterectomy, bilateral salpingo-oophorectomy, sentinel lymph node injection and biopsy and lysis of adhesions Final pathology: Mixed morphology, areas of endometrioid with some areas of mucinous features, MMR deficient, p53 wild-type, HER2/neu 0%  She tolerated cycle 1 of treatment well except for mild anemia and elevated blood pressure She tolerated cycle 2 of treatment without major problems except for elevated blood pressure and mild anemia Cycle 3 of treatment was tolerated well except for mild anemia Cycle 4 of chemotherapy was complicated by hypertension, anemia and mild neuropathy We will proceed with cycle 5 without delay I plan to repeat imaging study after cycle 6 of treatment

## 2024-08-01 NOTE — Assessment & Plan Note (Addendum)
 She has chronic neuropathy due to her chronic back issues and had pre-existing neuropathy before we started treatment Observe only Her neuropathy is not worse while on treatment

## 2024-08-19 ENCOUNTER — Emergency Department (HOSPITAL_COMMUNITY)
Admission: EM | Admit: 2024-08-19 | Discharge: 2024-08-19 | Disposition: A | Discharge status: Home / Self Care | Attending: Emergency Medicine | Admitting: Emergency Medicine

## 2024-08-19 ENCOUNTER — Encounter: Payer: Self-pay | Admitting: Neurology

## 2024-08-19 ENCOUNTER — Emergency Department (HOSPITAL_COMMUNITY)

## 2024-08-19 ENCOUNTER — Ambulatory Visit: Admitting: Neurology

## 2024-08-19 ENCOUNTER — Other Ambulatory Visit: Payer: Self-pay

## 2024-08-19 VITALS — BP 152/97 | HR 124 | Ht 66.0 in | Wt 269.0 lb

## 2024-08-19 DIAGNOSIS — S0993XA Unspecified injury of face, initial encounter: Secondary | ICD-10-CM

## 2024-08-19 DIAGNOSIS — Y92810 Car as the place of occurrence of the external cause: Secondary | ICD-10-CM | POA: Diagnosis not present

## 2024-08-19 DIAGNOSIS — G4485 Primary stabbing headache: Secondary | ICD-10-CM | POA: Diagnosis not present

## 2024-08-19 DIAGNOSIS — S0083XA Contusion of other part of head, initial encounter: Secondary | ICD-10-CM | POA: Diagnosis not present

## 2024-08-19 DIAGNOSIS — I10 Essential (primary) hypertension: Secondary | ICD-10-CM | POA: Diagnosis not present

## 2024-08-19 DIAGNOSIS — W01198A Fall on same level from slipping, tripping and stumbling with subsequent striking against other object, initial encounter: Secondary | ICD-10-CM | POA: Diagnosis not present

## 2024-08-19 DIAGNOSIS — S0990XA Unspecified injury of head, initial encounter: Secondary | ICD-10-CM | POA: Diagnosis present

## 2024-08-19 DIAGNOSIS — Z7982 Long term (current) use of aspirin: Secondary | ICD-10-CM | POA: Diagnosis not present

## 2024-08-19 NOTE — Discharge Instructions (Signed)
 Contact a health care provider if: You have trouble biting or chewing. Your pain or swelling gets worse. The discolored area gets much worse. Get help right away if: You have severe pain or a headache that is not relieved by medicine. You have unusual sleepiness, confusion, or personality changes. You vomit. You have a nosebleed that does not stop. You have double vision or blurred vision. You have clear fluid draining from your nose or ear, and it does not go away. You have trouble walking or using your arms or legs. You have severe dizziness.

## 2024-08-19 NOTE — Patient Instructions (Signed)
 If the headaches are brief and infrequent enough, I wouldn't start a daily medication because I think the side effects would outweigh the benefits.

## 2024-08-19 NOTE — ED Provider Notes (Signed)
 " [place] EMERGENCY DEPARTMENT AT [facility] Provider Note   CSN: [id number] Arrival date & time: 08/19/24  1709     Patient presents with: Fall and Facial Injury   [name] is a 79 y.o. female who presents for evaluation after fall.  Patient has a history of neuropathy.  She reports that she was getting out of a car and thought that her feet were on the ground but they were not she fell forward and hit the left side of her face on the ground.  Patient reports that she was on her way to the doctor to be evaluated for headaches which she has from a previous fall in the last month.  She been having headaches daily but she states that they are brief and relieved with Tylenol .  Patient denies any loss of consciousness, nausea, vomiting, confusion.  She has some swelling on the left side of her face.  She denies pain in her eye, vision change or pain with eye movement    Fall  Facial Injury      Prior to Admission medications  Medication Sig Start Date End Date Taking? Authorizing Provider  acetaminophen  (TYLENOL ) 500 MG tablet Take 1,000 mg by mouth daily as needed for headache, mild pain (pain score 1-3) or moderate pain (pain score 4-6).    [provider]  aspirin  EC 81 MG tablet Take 81 mg by mouth daily. Swallow whole.    [provider]  atorvastatin  (LIPITOR) 20 MG tablet TAKE 1 TABLET(20 MG) BY MOUTH AT BEDTIME 11/06/23   Tolia, Sunit, DO  Cholecalciferol  (VITAMIN D -3) 25 MCG (1000 UT) CAPS Take 1 capsule by mouth daily.    [provider]  clotrimazole-betamethasone  (LOTRISONE) cream Apply 1 Application topically 2 (two) times daily.    [provider]  dexamethasone  (DECADRON ) 4 MG tablet Take 2 tabs at the night before and 2 tab the morning of chemotherapy, every 3 weeks, by mouth x 6 cycles 04/15/24   Lonn Hicks, MD  escitalopram  (LEXAPRO ) 20 MG tablet Take 20 mg by mouth daily.    [provider]  ezetimibe   (ZETIA ) 10 MG tablet Take 10 mg by mouth at bedtime. 01/13/19   [provider]  Glycerin-Polysorbate 80 (REFRESH DRY EYE THERAPY OP) Place 1 drop into both eyes in the morning and at bedtime.    [provider]  hydrALAZINE (APRESOLINE) 25 MG tablet Take 25 mg by mouth daily.    [provider]  hydrochlorothiazide  (HYDRODIURIL ) 25 MG tablet Take 1 tablet (25 mg total) by mouth daily. 11/24/19   Therisa Arabia, PA-C  hydrOXYzine (ATARAX) 25 MG tablet Take 25 mg by mouth in the morning and at bedtime.    [provider]  irbesartan  (AVAPRO ) 300 MG tablet Take 1 tablet (300 mg total) by mouth at bedtime. Patient taking differently: Take 300 mg by mouth daily. 08/23/17   Elvie French HERO, PA-C  lidocaine -prilocaine  (EMLA ) cream Apply to affected area once 04/15/24   Lonn Hicks, MD  metFORMIN  (GLUCOPHAGE ) 500 MG tablet Take 1 tablet (500 mg total) by mouth daily with breakfast. 10/30/19   Therisa Arabia, PA-C  nystatin powder Apply 1 Application topically 2 (two) times daily.    [provider]  ondansetron  (ZOFRAN ) 8 MG tablet Take 1 tablet (8 mg total) by mouth every 8 (eight) hours as needed for nausea or vomiting. Start on the third day after carboplatin . 04/15/24   Lonn Hicks, MD  polyethylene glycol (MIRALAX  /  GLYCOLAX ) 17 g packet Take 17 g by mouth daily.    [provider]  prochlorperazine  (COMPAZINE ) 10 MG tablet Take 1 tablet (10 mg total) by mouth every 6 (six) hours as needed for nausea or vomiting. 04/15/24   Lonn Hicks, MD  verapamil  (VERELAN  PM) 240 MG 24 hr capsule Take 240 mg by mouth at bedtime.    [provider]    Allergies: Lyrica  [pregabalin ], Cymbalta [duloxetine hcl], Gabapentin , Lisinopril , Maxzide [hydrochlorothiazide -triamterene], Niacin , Pravastatin sodium, Simvastatin , and Lodine [etodolac]    Review of Systems  Updated Vital Signs BP (!) 153/104 (BP Location: Left Arm)   Pulse (!) 114   Temp 99.1 F (37.3  C) (Oral)   Resp 18   SpO2 99%   Physical Exam Vitals and nursing note reviewed.  Constitutional:      General: She is not in acute distress.    Appearance: She is well-developed. She is not diaphoretic.  HENT:     Head: Normocephalic.     Comments: Swelling to the left face, developing periorbital hematoma.  No pain with eye movement no signs of entrapment full and equal movement of the eyes.     Right Ear: External ear normal.     Left Ear: External ear normal.     Nose: Nose normal.     Mouth/Throat:     Mouth: Mucous membranes are moist.  Eyes:     General: No scleral icterus.    Conjunctiva/sclera: Conjunctivae normal.  Cardiovascular:     Rate and Rhythm: Normal rate and regular rhythm.     Heart sounds: Normal heart sounds. No murmur heard.    No friction rub. No gallop.  Pulmonary:     Effort: Pulmonary effort is normal. No respiratory distress.     Breath sounds: Normal breath sounds.  Abdominal:     General: Bowel sounds are normal. There is no distension.     Palpations: Abdomen is soft. There is no mass.     Tenderness: There is no abdominal tenderness. There is no guarding.  Musculoskeletal:     Cervical back: Normal range of motion.  Skin:    General: Skin is warm and dry.  Neurological:     General: No focal deficit present.     Mental Status: She is alert and oriented to person, place, and time.     Cranial Nerves: No cranial nerve deficit.     Sensory: No sensory deficit.     Motor: No weakness.     Coordination: Coordination normal.     Gait: Gait normal.     Deep Tendon Reflexes: Reflexes normal.  Psychiatric:        Behavior: Behavior normal.     (all labs ordered are listed, but only abnormal results are displayed) Labs Reviewed - No data to display  EKG: None  Radiology: CT Cervical Spine Wo Contrast Result Date: 08/19/2024 CLINICAL DATA:  Fall EXAM: CT CERVICAL SPINE WITHOUT CONTRAST TECHNIQUE: Multidetector CT imaging of the cervical  spine was performed without intravenous contrast. Multiplanar CT image reconstructions were also generated. RADIATION DOSE REDUCTION: This exam was performed according to the departmental dose-optimization program which includes automated exposure control, adjustment of the mA and/or kV according to patient size and/or use of iterative reconstruction technique. COMPARISON:  None Available. FINDINGS: Alignment: Straightening of the cervical spine. No subluxation. Facet alignment is normal Skull base and vertebrae: No acute fracture. No primary bone lesion or focal pathologic process. Soft tissues and spinal canal: No  prevertebral fluid or swelling. No visible canal hematoma. Disc levels: Advanced diffuse disc space narrowing C3 through T1. Multilevel facet degenerative changes and foraminal narrowing. Upper chest: Lung apices are clear. Partially visualized left-sided central venous port. Right thyroidectomy. Hypodense left thyroid  nodules measuring up to 12 mm, no imaging follow-up recommended. Carotid vascular calcification Other: None IMPRESSION: Straightening of the cervical spine with advanced degenerative changes. No acute osseous abnormality. Electronically Signed   By: Luke Bun M.D.   On: 08/19/2024 18:26   CT Maxillofacial Wo Contrast Result Date: 08/19/2024 CLINICAL DATA:  [place]lem[place] and hit left side of face on concrete EXAM: CT MAXILLOFACIAL WITHOUT CONTRAST TECHNIQUE: Multidetector CT imaging of the maxillofacial structures was performed. Multiplanar CT image reconstructions were also generated. RADIATION DOSE REDUCTION: This exam was performed according to the departmental dose-optimization program which includes automated exposure control, adjustment of the mA and/or kV according to patient size and/or use of iterative reconstruction technique. COMPARISON:  None Available. FINDINGS: Osseous: Mastoid air cells are clear. Mandibular heads are normally position. No mandibular fracture. Pterygoid plates  and zygomatic arches appear intact. No acute nasal bone fracture. Possible tiny age indeterminate fracture involving the anterior nasal spine of the maxilla. Orbits: Negative. No traumatic or inflammatory finding. Sinuses: Opacified right sphenoid sinus. No fluid levels. No definitive sinus wall fracture. Mucosal thickening in the maxillary sinuses Soft tissues: Soft tissue swelling/contusion involving left cheek and periorbital soft tissues Limited intracranial: See separately dictated head CT IMPRESSION: 1. No acute facial bone fracture. Possible tiny age indeterminate fracture involving anterior nasal spine of maxilla 2. Left cheek and periorbital contusion/soft tissue swelling Electronically Signed   By: Luke Bun M.D.   On: 08/19/2024 18:20   CT Head Wo Contrast Result Date: 08/19/2024 CLINICAL DATA:  Blunt trauma hit left side of face on concrete EXAM: CT HEAD WITHOUT CONTRAST TECHNIQUE: Contiguous axial images were obtained from the base of the skull through the vertex without intravenous contrast. RADIATION DOSE REDUCTION: This exam was performed according to the departmental dose-optimization program which includes automated exposure control, adjustment of the mA and/or kV according to patient size and/or use of iterative reconstruction technique. COMPARISON:  CT 10/27/2023 FINDINGS: Brain: No acute territorial infarction, hemorrhage or intracranial mass. Mild atrophy and minimal chronic small vessel ischemic changes of the white matter. Stable ventricle size. Empty appearing sella with expanded CSF density. This is unchanged. Vascular: No hyperdense vessels.  Carotid vascular calcification Skull: No fracture Sinuses/Orbits: Opacified right sphenoid sinus Other: None IMPRESSION: 1. No CT evidence for acute intracranial abnormality. 2. Mild atrophy and chronic small vessel ischemic changes of the white matter. Electronically Signed   By: Luke Bun M.D.   On: 08/19/2024 18:11     Procedures    Medications Ordered in the ED - No data to display                                  Medical Decision Making Amount and/or Complexity of Data Reviewed Radiology: ordered.   Patient here with mechanical fall with left-sided facial trauma.  I visualized and interpreted images.  She may have a small age-indeterminate left tiny maxillary fracture.  Stable and not significant.  Patient has no signs of entrapment, on exam.  She has no abnormal vision.  Patient's exam also shows no signs of malocclusion or dental injury.  She has normal mentation.  She was offered Tylenol  but declined pain medications.  She was given an ice pack for swelling.  Relation of her in the emergency department, observation for an hour and a half without mental change, CT scans of the head maxillofacial and cervical spine that showed no acute fracture or other emergent abnormality patient appears appropriate for discharge with strict return precautions.     Final diagnoses:  None    ED Discharge Orders     None          Arloa Chroman, PA-C 08/19/24 1833  "

## 2024-08-19 NOTE — ED Triage Notes (Signed)
 Pt arrives to triage via wheelchair reporting that she fell down and struck the LEFT side of the face on concrete. Pt states that she fell due to her neuropathy. Pt reports that she takes 81mg  ASA daily. LEFT eye swelling appears to be worsening during triage.

## 2024-08-19 NOTE — Progress Notes (Signed)
 "  NEUROLOGY CONSULTATION NOTE  [name] MRN: [id number] DOB: 03/27/1946  Referring provider: Elsie Lesches, MD Primary care provider: Elsie Lesches, MD  Reason for consult:  headache  Assessment/Plan:   Headache, unspecified, possibly posttraumatic HTN  As headaches are so brief and infrequent (just once a day for 5 minutes), I would start a preventative medication.  Otherwise, we can consider nortriptyline.  However, she has longstanding history of medication intolerance.  I would think side effects would likely outweigh benefits.    Follow up with PCP regarding BP  Total time spent on today's visit was 48 minutes dedicated to this patient today, preparing to see patient, examining the patient, ordering tests and/or medications and counseling the patient, documenting clinical information in the EHR or other health record, independently interpreting results and communicating results to the patient/family, discussing treatment and goals, answering patient's questions and coordinating care.    Subjective:  [name] is an 79 year old right-handed female with CKD, coronary arterial calcification, HTN, HLD, chronic low back pain/lumbar stenosis, DM 2 with neuropathy, and history of breast cancer and endometrial cancer who presents for headaches.  History supplemented by referring provider's note.  CTV head personally reviewed.  Discussed the use of AI scribe software for clinical note transcription with the patient, who gave verbal consent to proceed.  History of Present Illness [name] is an 79 year old female with prior pituitary tumor resection, lumbar spine surgeries, and bilateral knee replacements presenting for evaluation of recurrent headaches, new-onset neck pain, chronic neuropathy, and persistent parageusia.  Since March 2025, following a fall with head trauma to the right side, she has experienced intermittent, sharp headaches occurring nightly,  typically lasting less than five minutes. The pain is severe at times, migratory, and localized to the temple or frontal regions, without occipital involvement. Headaches are more frequent when lying down but can also occur while sitting. She achieves relief with acetaminophen  or, rarely, hydrocodone . She denies a prior history of headaches and describes current symptoms as manageable, though occasionally severe.    She was subsequently seen in the ED on 10/27/2023 where CTV head was performed, which revealed partially empty sella and small left transverse sinus but no dural venous thrombosis or acute intracranial abnormality.    Denies prior history of migraines or other headaches.   Current medications:  escitalopram  20mg , verapamil  ER 240mg  daily, ASA 81mg , acetaminophen  PRN, irbesartan , hydroxyzine, hydrocodone -acetaminophen  PRN (for back pain) Past medications:  duloxetine (upset stomach), gabapentin  (intolerance), pregablin (dizziness), lisinopril  (facial swelling)  Other history: She has persistent parageusia since pituitary tumor resection eight to nine years ago, characterized by a bitter taste at the posterior oral cavity, especially when consuming unsweetened foods. She denies anosmia and reports worsening of altered taste with age.  She has chronic neuropathy affecting her hands and legs, with stinging sensations in her toes and hands. She has undergone bilateral knee replacements and two lumbar spine surgeries, including one complicated by a misplaced screw. She continues to experience pain and swelling in her legs. Previous trials of gabapentin  were discontinued due to allergy; she currently uses acetaminophen  and topical agents for symptom management. She is taking Lexapro  but is unsure of the indication for some medications.   PAST MEDICAL HISTORY: Past Medical History:  Diagnosis Date   Anemia    Anxiety    Cancer (HCC)    breast treated with radiation  endometrial   Chronic  fatigue syndrome    Chronic low back pain    followed  by dr jinny. mavis   CKD (chronic kidney disease), stage III (HCC)    followed by pcp   Constipation 02/20/2024   02-21-2024  pt stated viist w/ PCP due to constipation given stool softner and abdominal xray done   Coronary artery calcification 05/2022   cardiologist--- dr s. michele;   CT 11/ 2023 ;  CCTA 09-04-2022  calcium  score=36.9 involving LAD w/ mild nonob dz;  NUC 08-14-2022  LR w/ mild perfusion defect reversible, nuclear ef 60%   DDD (degenerative disc disease), lumbar    Depression    Diverticulosis of colon    DOE (dyspnea on exertion)    02-21-2024  sob w/ stairs/ long distance walking/  okay with household chores ,  per cardiologist note 09/2023  chronic DOE stable   Edema of both lower extremities    02-21-2024 pt stated left > right   Flat foot, acquired    GERD (gastroesophageal reflux disease)    History of adenomatous polyp of colon    GI-- dr shila   History of gout    History of kidney stones    History of left breast cancer 2013   oncologist--- dr layla (lov 09-06-2017 released w/ no recurrence) ;  08-30-2011  s/p left lumpectomy w/ node dissection;  Stage IAG1, IDC, ER/PR+, nodes negative;  completed IMRT 11-24-2011 ,  completed tamoxifen  02/ 2019   History of radiation therapy    Endometrium-05/27/24-06/23/24- Dr. Lynwood Nasuti   Hyperlipidemia    Hypertension    Lumbar stenosis with neurogenic claudication    per pt left leg   OA (osteoarthritis)    OSA on CPAP 07/2004   followed by [facility]--- dr jude;   sleep study in epic 08-05-2004 severe osa, (02-21-2024 pt stated uses cpap nightly)   Peripheral neuropathy    Personal history of radiation therapy 09/2011   left breast/ chest wall   10-29-2011  to  11-24-2011  6100 cGy   Pneumonia    Rash    02-21-2024  pt stated has rash under abd fold and between thighs   Sickle cell trait    Thickened endometrium    Tinnitus of both ears    Type 2 diabetes  mellitus (HCC)    followed by pcp:   (02-21-2024  pt stated checks  blood sugar 2 times montly , not fasting)   Wears partial dentures    upper    PAST SURGICAL HISTORY: Past Surgical History:  Procedure Laterality Date   APPENDECTOMY  1978   BIOPSY  10/06/2021   Procedure: BIOPSY;  Surgeon: Shila Gustav GAILS, MD;  Location: [facility];  Service: Endoscopy;;   BLEPHAROPLASTY Right    age 25s---  right upper eyelid   BREAST LUMPECTOMY WITH NEEDLE LOCALIZATION Right 02/06/2013   Procedure: RIGHT BREAST LUMPECTOMY WITH NEEDLE LOCALIZATION;  Surgeon: Vicenta DELENA Poli, MD;  Location: [facility];  Service: General;  Laterality: Right;   BREAST LUMPECTOMY WITH NEEDLE LOCALIZATION AND AXILLARY SENTINEL LYMPH NODE BX Left 08/30/2011   @[facility]  by dr d. poli;   CATARACT EXTRACTION W/ INTRAOCULAR LENS IMPLANT Bilateral 2018   COLONOSCOPY WITH PROPOFOL  N/A 10/27/2015   Procedure: COLONOSCOPY WITH PROPOFOL ;  Surgeon: Gustav Shila GAILS, MD;  Location: [facility];  Service: Endoscopy;  Laterality: N/A;   COLONOSCOPY WITH PROPOFOL  N/A 10/06/2021   Procedure: COLONOSCOPY WITH PROPOFOL ;  Surgeon: Shila Gustav GAILS, MD;  [facility]: [facility];  Service: Endoscopy;  Laterality: N/A;   DILATATION & CURRETTAGE/HYSTEROSCOPY WITH RESECTOCOPE N/A 02/28/2024  Procedure: DILATATION & CURETTAGE/HYSTEROSCOPY;  Surgeon: Okey Leader, MD;  Location: [facility] OR;  Service: Gynecology;  Laterality: N/A;   HARDWARE REMOVAL Left 04/20/2015   Procedure: HARDWARE REMOVAL LEFT LUMBAR FIVE SCREW;  Surgeon: Darina Boehringer, MD;  Location: [facility] OR;  Service: Neurosurgery;  Laterality: Left;   HYSTEROSCOPY WITH D & C  10/04/2005   @[facility]  by dr a. okey;  polypectomy   INCISIONAL HERNIA REPAIR N/A 04/03/2024   Procedure: LYSIS OF ADHESIONS;  Surgeon: Lyndel Deward PARAS, MD;  [facility]: [facility];  Service: General;  Laterality: N/A;   INJECTION, FOR SENTINEL LYMPH NODE IDENTIFICATION N/A 04/03/2024   Procedure: INJECTION, FOR SENTINEL  LYMPH NODE IDENTIFICATION;  Surgeon: [name], MD;  Location: [facility];  Service: Gynecology;  Laterality: N/A;   IR IMAGING GUIDED PORT INSERTION  04/28/2024   LYMPH NODE BIOPSY N/A 04/03/2024   Procedure: LYMPH NODE BIOPSY;  Surgeon: [name], MD;  Location: [facility];  Service: Gynecology;  Laterality: N/A;   POLYPECTOMY  10/06/2021   Procedure: POLYPECTOMY;  Surgeon: Shila Gustav GAILS, MD;  [facility];  Service: Endoscopy;;   POSTERIOR FUSION LUMBAR SPINE  04/19/2020   @[facility]  by dr r. kritzer:  Laminectomy / Decompression  L2--3 /  interbody fusion w/ segmental instrumentation L2--L5   POSTERIOR LUMBAR FUSION 2 LEVEL  12/29/2014   @[facility]  by dr saunders. kritzer;   L3--4  and L4--5   ROBOTIC ASSISTED LAPAROSCOPIC LYSIS OF ADHESION  04/03/2024   Procedure: LYSIS, ADHESIONS, ROBOT-ASSISTED, LAPAROSCOPIC;  Surgeon: [name], MD;  Location[facility];  Service: Gynecology;;   ROBOTIC ASSISTED TOTAL HYSTERECTOMY WITH BILATERAL SALPINGO OOPHERECTOMY Bilateral 04/03/2024   Procedure: HYSTERECTOMY, TOTAL, ROBOT-ASSISTED, LAPAROSCOPIC, WITH BILATERAL SALPINGO-OOPHORECTOMY;  Surgeon: [name], MD;  Location: [facility];  Service: Gynecology;  Laterality: Bilateral;  WITH POSSIBLE LAPAROTOMY   THYROID  CYST EXCISION  1994   benign   TOE SURGERY Left    TOTAL KNEE ARTHROPLASTY Left 08/19/2004   @[facility]  by dr r. gioffre   TOTAL KNEE ARTHROPLASTY Right 05/24/2005   @[facility]  by dr r. gioffre   TRANSPHENOIDAL PITUITARY RESECTION  01/25/2010   @[facility]  by dr r. kritzer/ dr c. 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    MEDICATIONS: Medications Ordered Prior to Encounter[1]  ALLERGIES: Allergies[2]  FAMILY HISTORY: Family History  Problem Relation Age of Onset   Stroke Mother    Diabetes Mother    Hypertension Mother    Hyperlipidemia Mother    Obesity Mother    Heart disease Father    Clotting disorder Father    Stroke Father    Hypertension  Father    Heart Problems Father    Breast cancer Sister 21   Colon cancer Neg Hx    Neuropathy Neg Hx     Objective:  Blood pressure (!) 152/97, pulse (!) 124, height 5' 6 (1.676 m), weight 269 lb (122 kg), SpO2 98%. General: No acute distress.  Patient appears well-groomed.   Head:  Normocephalic/atraumatic Eyes:  fundi examined but not visualized Neck: supple, right paraspinal tenderness, full range of motion Heart: regular rate and rhythm Neurological Exam: Mental status: alert and oriented to person, place, and time, speech fluent and not dysarthric, language intact. Cranial nerves: CN I: not tested CN II: pupils equal, round and reactive to light, visual fields intact CN III, IV, VI:  full range of motion, no nystagmus, no ptosis CN V: facial sensation intact. CN VII: upper and lower face  symmetric CN VIII: hearing intact CN IX, X: gag intact, uvula midline CN XI: sternocleidomastoid and trapezius muscles intact CN XII: tongue midline Bulk & Tone: normal, no fasciculations. Motor:  muscle strength 5/5 throughout Sensation:  Pinprick and vibratory sensation intact. Deep Tendon Reflexes:  2+ throughout,  toes downgoing.   Finger to nose testing:  Without dysmetria.   Gait:  Wide-based cautious gait.  Uses rollator.  Romberg negative    Juliene Dunnings, DO  CC: Elsie Lesches, MD        [1]  Current Outpatient Medications on File Prior to Visit  Medication Sig Dispense Refill   acetaminophen  (TYLENOL ) 500 MG tablet Take 1,000 mg by mouth daily as needed for headache, mild pain (pain score 1-3) or moderate pain (pain score 4-6).     aspirin  EC 81 MG tablet Take 81 mg by mouth daily. Swallow whole.     atorvastatin  (LIPITOR) 20 MG tablet TAKE 1 TABLET(20 MG) BY MOUTH AT BEDTIME 90 tablet 3   Cholecalciferol  (VITAMIN D -3) 25 MCG (1000 UT) CAPS Take 1 capsule by mouth daily.     clotrimazole-betamethasone  (LOTRISONE) cream Apply 1 Application topically 2 (two) times  daily.     dexamethasone  (DECADRON ) 4 MG tablet Take 2 tabs at the night before and 2 tab the morning of chemotherapy, every 3 weeks, by mouth x 6 cycles 24 tablet 6   escitalopram  (LEXAPRO ) 20 MG tablet Take 20 mg by mouth daily.     ezetimibe  (ZETIA ) 10 MG tablet Take 10 mg by mouth at bedtime.     Glycerin-Polysorbate 80 (REFRESH DRY EYE THERAPY OP) Place 1 drop into both eyes in the morning and at bedtime.     hydrALAZINE (APRESOLINE) 25 MG tablet Take 25 mg by mouth daily.     hydrochlorothiazide  (HYDRODIURIL ) 25 MG tablet Take 1 tablet (25 mg total) by mouth daily. 30 tablet 0   hydrOXYzine (ATARAX) 25 MG tablet Take 25 mg by mouth in the morning and at bedtime.     irbesartan  (AVAPRO ) 300 MG tablet Take 1 tablet (300 mg total) by mouth at bedtime. (Patient taking differently: Take 300 mg by mouth daily.) 30 tablet 0   lidocaine -prilocaine  (EMLA ) cream Apply to affected area once 30 g 3   metFORMIN  (GLUCOPHAGE ) 500 MG tablet Take 1 tablet (500 mg total) by mouth daily with breakfast. 90 tablet 0   nystatin powder Apply 1 Application topically 2 (two) times daily.     ondansetron  (ZOFRAN ) 8 MG tablet Take 1 tablet (8 mg total) by mouth every 8 (eight) hours as needed for nausea or vomiting. Start on the third day after carboplatin . 30 tablet 1   polyethylene glycol (MIRALAX  / GLYCOLAX ) 17 g packet Take 17 g by mouth daily.     prochlorperazine  (COMPAZINE ) 10 MG tablet Take 1 tablet (10 mg total) by mouth every 6 (six) hours as needed for nausea or vomiting. 30 tablet 1   verapamil  (VERELAN  PM) 240 MG 24 hr capsule Take 240 mg by mouth at bedtime.     No current facility-administered medications on file prior to visit.  [2]  Allergies Allergen Reactions   Lyrica  [Pregabalin ]     Dizziness   Cymbalta [Duloxetine Hcl] Other (See Comments)    Stomach pain   Gabapentin  Itching   Lisinopril  Swelling   Maxzide [Hydrochlorothiazide -Triamterene] Other (See Comments)    Cramping    Niacin   Itching   Pravastatin Sodium Other (See Comments)    Headache  Simvastatin  Other (See Comments)    Memory loss   Lodine [Etodolac] Itching   "

## 2024-08-21 MED FILL — Fosaprepitant Dimeglumine For IV Infusion 150 MG (Base Eq): INTRAVENOUS | Qty: 5 | Status: AC

## 2024-08-22 ENCOUNTER — Encounter: Payer: Self-pay | Admitting: Hematology and Oncology

## 2024-08-22 ENCOUNTER — Inpatient Hospital Stay

## 2024-08-22 ENCOUNTER — Inpatient Hospital Stay: Admitting: Hematology and Oncology

## 2024-08-22 VITALS — BP 169/98 | HR 78

## 2024-08-22 VITALS — BP 168/76 | HR 93 | Temp 98.0°F | Resp 18 | Ht 66.0 in | Wt 265.6 lb

## 2024-08-22 DIAGNOSIS — E210 Primary hyperparathyroidism: Secondary | ICD-10-CM | POA: Diagnosis not present

## 2024-08-22 DIAGNOSIS — D539 Nutritional anemia, unspecified: Secondary | ICD-10-CM | POA: Diagnosis not present

## 2024-08-22 DIAGNOSIS — Z5111 Encounter for antineoplastic chemotherapy: Secondary | ICD-10-CM | POA: Diagnosis not present

## 2024-08-22 DIAGNOSIS — N1831 Chronic kidney disease, stage 3a: Secondary | ICD-10-CM

## 2024-08-22 DIAGNOSIS — Z853 Personal history of malignant neoplasm of breast: Secondary | ICD-10-CM

## 2024-08-22 DIAGNOSIS — C541 Malignant neoplasm of endometrium: Secondary | ICD-10-CM

## 2024-08-22 LAB — CBC WITH DIFFERENTIAL (CANCER CENTER ONLY)
Abs Immature Granulocytes: 0.08 K/uL — ABNORMAL HIGH (ref 0.00–0.07)
Basophils Absolute: 0 K/uL (ref 0.0–0.1)
Basophils Relative: 0 %
Eosinophils Absolute: 0 K/uL (ref 0.0–0.5)
Eosinophils Relative: 0 %
HCT: 31.6 % — ABNORMAL LOW (ref 36.0–46.0)
Hemoglobin: 10.3 g/dL — ABNORMAL LOW (ref 12.0–15.0)
Immature Granulocytes: 1 %
Lymphocytes Relative: 11 %
Lymphs Abs: 0.7 K/uL (ref 0.7–4.0)
MCH: 24.8 pg — ABNORMAL LOW (ref 26.0–34.0)
MCHC: 32.6 g/dL (ref 30.0–36.0)
MCV: 76 fL — ABNORMAL LOW (ref 80.0–100.0)
Monocytes Absolute: 0.2 K/uL (ref 0.1–1.0)
Monocytes Relative: 3 %
Neutro Abs: 5.6 K/uL (ref 1.7–7.7)
Neutrophils Relative %: 85 %
Platelet Count: 250 K/uL (ref 150–400)
RBC: 4.16 MIL/uL (ref 3.87–5.11)
RDW: 20.3 % — ABNORMAL HIGH (ref 11.5–15.5)
WBC Count: 6.7 K/uL (ref 4.0–10.5)
nRBC: 0 % (ref 0.0–0.2)

## 2024-08-22 LAB — CMP (CANCER CENTER ONLY)
ALT: 13 U/L (ref 0–44)
AST: 22 U/L (ref 15–41)
Albumin: 4.2 g/dL (ref 3.5–5.0)
Alkaline Phosphatase: 122 U/L (ref 38–126)
Anion gap: 16 — ABNORMAL HIGH (ref 5–15)
BUN: 13 mg/dL (ref 8–23)
CO2: 20 mmol/L — ABNORMAL LOW (ref 22–32)
Calcium: 11 mg/dL — ABNORMAL HIGH (ref 8.9–10.3)
Chloride: 107 mmol/L (ref 98–111)
Creatinine: 1.35 mg/dL — ABNORMAL HIGH (ref 0.44–1.00)
GFR, Estimated: 40 mL/min — ABNORMAL LOW
Glucose, Bld: 151 mg/dL — ABNORMAL HIGH (ref 70–99)
Potassium: 3.8 mmol/L (ref 3.5–5.1)
Sodium: 144 mmol/L (ref 135–145)
Total Bilirubin: 0.4 mg/dL (ref 0.0–1.2)
Total Protein: 8.1 g/dL (ref 6.5–8.1)

## 2024-08-22 LAB — TSH: TSH: 0.735 u[IU]/mL (ref 0.350–4.500)

## 2024-08-22 MED ORDER — FAMOTIDINE IN NACL 20-0.9 MG/50ML-% IV SOLN
20.0000 mg | Freq: Once | INTRAVENOUS | Status: AC
  Administered 2024-08-22: 20 mg via INTRAVENOUS
  Filled 2024-08-22: qty 50

## 2024-08-22 MED ORDER — SODIUM CHLORIDE 0.9 % IV SOLN
349.0000 mg | Freq: Once | INTRAVENOUS | Status: AC
  Administered 2024-08-22: 350 mg via INTRAVENOUS
  Filled 2024-08-22: qty 35

## 2024-08-22 MED ORDER — DEXAMETHASONE SOD PHOSPHATE PF 10 MG/ML IJ SOLN
10.0000 mg | Freq: Once | INTRAMUSCULAR | Status: AC
  Administered 2024-08-22: 10 mg via INTRAVENOUS
  Filled 2024-08-22: qty 1

## 2024-08-22 MED ORDER — SODIUM CHLORIDE 0.9 % IV SOLN
200.0000 mg | Freq: Once | INTRAVENOUS | Status: AC
  Administered 2024-08-22: 200 mg via INTRAVENOUS
  Filled 2024-08-22: qty 200

## 2024-08-22 MED ORDER — SODIUM CHLORIDE 0.9 % IV SOLN: INTRAVENOUS | Status: DC

## 2024-08-22 MED ORDER — SODIUM CHLORIDE 0.9 % IV SOLN
150.0000 mg | Freq: Once | INTRAVENOUS | Status: AC
  Administered 2024-08-22: 150 mg via INTRAVENOUS
  Filled 2024-08-22: qty 150

## 2024-08-22 MED ORDER — SODIUM CHLORIDE 0.9 % IV SOLN
80.0000 mg/m2 | Freq: Once | INTRAVENOUS | Status: AC
  Administered 2024-08-22: 162 mg via INTRAVENOUS
  Filled 2024-08-22: qty 27

## 2024-08-22 MED ORDER — CETIRIZINE HCL 10 MG/ML IV SOLN
10.0000 mg | Freq: Once | INTRAVENOUS | Status: AC
  Administered 2024-08-22: 10 mg via INTRAVENOUS
  Filled 2024-08-22: qty 1

## 2024-08-22 MED ORDER — SODIUM CHLORIDE 0.9% FLUSH: 10.0000 mL | INTRAVENOUS | Status: DC | PRN

## 2024-08-22 MED ORDER — PALONOSETRON HCL INJECTION 0.25 MG/5ML
0.2500 mg | Freq: Once | INTRAVENOUS | Status: AC
  Administered 2024-08-22: 0.25 mg via INTRAVENOUS
  Filled 2024-08-22: qty 5

## 2024-08-22 NOTE — Assessment & Plan Note (Addendum)
 Recent vitamin B12 and iron studies are adequate Observe closely Likely due to side effects of treatment but she is not symptomatic

## 2024-08-22 NOTE — Patient Instructions (Signed)
 [facility] - A DEPT OF [facility]  Discharge Instructions: Thank you for choosing [facility] to provide your oncology and hematology care.   If you have a lab appointment with the Cancer Center, please go directly to the Cancer Center and check in at the registration area.   Wear comfortable clothing and clothing appropriate for easy access to any Portacath or PICC line.   We strive to give you quality time with your provider. You may need to reschedule your appointment if you arrive late (15 or more minutes).  Arriving late affects you and other patients whose appointments are after yours.  Also, if you miss three or more appointments without notifying the office, you may be dismissed from the clinic at the providers discretion.      For prescription refill requests, have your pharmacy contact our office and allow 72 hours for refills to be completed.    Today you received the following chemotherapy and/or immunotherapy agents keytruda  taxol  and carboplatin       To help prevent nausea and vomiting after your treatment, we encourage you to take your nausea medication as directed.  BELOW ARE SYMPTOMS THAT SHOULD BE REPORTED IMMEDIATELY: *FEVER GREATER THAN 100.4 F (38 C) OR HIGHER *CHILLS OR SWEATING *NAUSEA AND VOMITING THAT IS NOT CONTROLLED WITH YOUR NAUSEA MEDICATION *UNUSUAL SHORTNESS OF BREATH *UNUSUAL BRUISING OR BLEEDING *URINARY PROBLEMS (pain or burning when urinating, or frequent urination) *BOWEL PROBLEMS (unusual diarrhea, constipation, pain near the anus) TENDERNESS IN MOUTH AND THROAT WITH OR WITHOUT PRESENCE OF ULCERS (sore throat, sores in mouth, or a toothache) UNUSUAL RASH, SWELLING OR PAIN  UNUSUAL VAGINAL DISCHARGE OR ITCHING   Items with * indicate a potential emergency and should be followed up as soon as possible or go to the Emergency Department if any problems should occur.  Please show the CHEMOTHERAPY ALERT  CARD or IMMUNOTHERAPY ALERT CARD at check-in to the Emergency Department and triage nurse.  Should you have questions after your visit or need to cancel or reschedule your appointment, please contact [facility] - A DEPT [facility]  Dept: [phone]  and follow the prompts.  Office hours are 8:00 a.m. to 4:30 p.m. Monday - Friday. Please note that voicemails left after 4:00 p.m. may not be returned until the following business day.  We are closed weekends and major holidays. You have access to a nurse at all times for urgent questions. Please call the main number to the clinic Dept: [phone] and follow the prompts.   For any non-urgent questions, you may also contact your provider using MyChart. We now offer e-Visits for anyone 68 and older to request care online for non-urgent symptoms. For details visit [email or web address].   Also download the MyChart app! Go to the app store, search MyChart, open the app, select [place], and log in with your MyChart username and password.

## 2024-08-22 NOTE — Assessment & Plan Note (Addendum)
 The patient was diagnosed with endometrial cancer after an incidental finding noted on CT imaging She underwent complete hysterectomy, bilateral salpingo-oophorectomy, sentinel lymph node injection and biopsy and lysis of adhesions Final pathology: Mixed morphology, areas of endometrioid with some areas of mucinous features, MMR deficient, p53 wild-type, HER2/neu 0%  She tolerated cycle 1 of treatment well except for mild anemia and elevated blood pressure She tolerated cycle 2 of treatment without major problems except for elevated blood pressure and mild anemia Cycle 3 of treatment was tolerated well except for mild anemia Cycle 4 of chemotherapy was complicated by hypertension, anemia and mild neuropathy Recent cycle 5 treatment was complicated by weakness and fall We will proceed with final treatment today I plan to repeat imaging study in 2 weeks and after that if she has no evidence of disease, she will be switched over to maintenance pembrolizumab 

## 2024-08-22 NOTE — Progress Notes (Signed)
 [name]  Patient Care Team: [name], MD as PCP - General (Family Medicine) [name] Richardson, DO as PCP - Cardiology (Cardiology) [name] Arch, MD (Inactive) as Referring Physician (Obstetrics and Gynecology) [name] Juliene SAUNDERS, DO as Consulting Physician (Neurology)  Assessment & Plan Stage 3a chronic kidney disease [facility]) I will adjust the dose of chemotherapy according to her creatinine Hyperparathyroidism, primary She had prior diagnosis of primary hyperparathyroidism She is also taking hydrochlorothiazide  because hypercalcemia Just to be sure, I will also order myeloma panel Endometrial cancer [facility]) The patient was diagnosed with endometrial cancer after an incidental finding noted on CT imaging She underwent complete hysterectomy, bilateral salpingo-oophorectomy, sentinel lymph node injection and biopsy and lysis of adhesions Final pathology: Mixed morphology, areas of endometrioid with some areas of mucinous features, MMR deficient, p53 wild-type, HER2/neu 0%  She tolerated cycle 1 of treatment well except for mild anemia and elevated blood pressure She tolerated cycle 2 of treatment without major problems except for elevated blood pressure and mild anemia Cycle 3 of treatment was tolerated well except for mild anemia Cycle 4 of chemotherapy was complicated by hypertension, anemia and mild neuropathy Recent cycle 5 treatment was complicated by weakness and fall We will proceed with final treatment today I plan to repeat imaging study in 2 weeks and after that if she has no evidence of disease, she will be switched over to maintenance pembrolizumab  Deficiency anemia Recent vitamin B12 and iron studies are adequate Observe closely Likely due to side effects of treatment but she is not symptomatic  Orders Placed This Encounter  Procedures   CT CHEST ABDOMEN PELVIS W CONTRAST    Standing Status:   Future    Expected Date:   09/05/2024     Expiration Date:   08/22/2025    If indicated for the ordered procedure, I authorize the administration of contrast media per Radiology protocol:   Yes    Does the patient have a contrast media/X-ray dye allergy?:   No    Preferred imaging location?:   [facility]    If indicated for the ordered procedure, I authorize the administration of oral contrast media per Radiology protocol:   No    Reason for no oral contrast::   no need oral contrast   Kappa/lambda light chains    Standing Status:   Future    Number of Occurrences:   1    Expected Date:   08/22/2024    Expiration Date:   08/22/2025   Multiple Myeloma Panel (SPEP&IFE w/QIG)    Standing Status:   Future    Number of Occurrences:   1    Expected Date:   08/22/2024    Expiration Date:   08/22/2025     [name] Bedford, MD  INTERVAL HISTORY: she returns for treatment follow-up Complications related to previous cycle of chemotherapy included anemia,, elevated serum creatinine, and recent fall and hypercalcemia She denies recent injury No worsening peripheral neuropathy  PHYSICAL EXAMINATION: ECOG PERFORMANCE STATUS: 1 - Symptomatic but completely ambulatory  No results found for: CAN125    Latest Ref Rng & Units 08/22/2024    9:35 AM 08/01/2024   10:05 AM 07/03/2024   10:10 AM  CBC  WBC 4.0 - 10.5 K/uL 6.7  5.7  5.2   Hemoglobin 12.0 - 15.0 g/dL 89.6  89.4  89.1   Hematocrit 36.0 - 46.0 % 31.6  32.1  33.3   Platelets 150 - 400 K/uL  250  201  224       Chemistry      Component Value Date/Time   NA 144 08/22/2024 0935   NA 143 10/02/2022 0832   NA 140 06/01/2016 1143   K 3.8 08/22/2024 0935   K 4.0 06/01/2016 1143   CL 107 08/22/2024 0935   CL 105 12/09/2012 1423   CO2 20 (L) 08/22/2024 0935   CO2 26 06/01/2016 1143   BUN 13 08/22/2024 0935   BUN 15 10/02/2022 0832   BUN 12.5 06/01/2016 1143   CREATININE 1.35 (H) 08/22/2024 0935   CREATININE 1.1 06/01/2016 1143   GLU 100 06/30/2019 0000      Component Value  Date/Time   CALCIUM  11.0 (H) 08/22/2024 0935   CALCIUM  10.1 06/01/2016 1143   ALKPHOS 122 08/22/2024 0935   ALKPHOS 69 06/01/2016 1143   AST 22 08/22/2024 0935   AST 11 06/01/2016 1143   ALT 13 08/22/2024 0935   ALT 12 06/01/2016 1143   BILITOT 0.4 08/22/2024 0935   BILITOT 0.28 06/01/2016 1143       Vitals:   08/22/24 1016  BP: (!) 168/76  Pulse: 93  Resp: 18  Temp: 98 F (36.7 C)  SpO2: 100%   Filed Weights   08/22/24 1016  Weight: 265 lb 9.6 oz (120.5 kg)   Other relevant data reviewed during this visit included CBC, CMP

## 2024-08-22 NOTE — Assessment & Plan Note (Addendum)
 I will adjust the dose of chemotherapy according to her creatinine

## 2024-08-22 NOTE — Assessment & Plan Note (Addendum)
 She had prior diagnosis of primary hyperparathyroidism She is also taking hydrochlorothiazide  because hypercalcemia Just to be sure, I will also order myeloma panel

## 2024-08-23 LAB — T4: T4, Total: 8.7 ug/dL (ref 4.5–12.0)

## 2024-08-26 LAB — MULTIPLE MYELOMA PANEL, SERUM
Albumin SerPl Elph-Mcnc: 3.3 g/dL (ref 2.9–4.4)
Albumin/Glob SerPl: 0.9 (ref 0.7–1.7)
Alpha 1: 0.3 g/dL (ref 0.0–0.4)
Alpha2 Glob SerPl Elph-Mcnc: 1 g/dL (ref 0.4–1.0)
B-Globulin SerPl Elph-Mcnc: 1.4 g/dL — ABNORMAL HIGH (ref 0.7–1.3)
Gamma Glob SerPl Elph-Mcnc: 1.1 g/dL (ref 0.4–1.8)
Globulin, Total: 3.9 g/dL (ref 2.2–3.9)
IgA: 304 mg/dL (ref 64–422)
IgG (Immunoglobin G), Serum: 1252 mg/dL (ref 586–1602)
IgM (Immunoglobulin M), Srm: 35 mg/dL (ref 26–217)
Total Protein ELP: 7.2 g/dL (ref 6.0–8.5)

## 2024-08-26 LAB — KAPPA/LAMBDA LIGHT CHAINS
Kappa free light chain: 24.4 mg/L — ABNORMAL HIGH (ref 3.3–19.4)
Kappa, lambda light chain ratio: 1.44 (ref 0.26–1.65)
Lambda free light chains: 17 mg/L (ref 5.7–26.3)

## 2024-09-05 ENCOUNTER — Ambulatory Visit (HOSPITAL_COMMUNITY): Admission: RE | Admit: 2024-09-05 | Source: Ambulatory Visit

## 2024-09-05 DIAGNOSIS — N183 Chronic kidney disease, stage 3 unspecified: Secondary | ICD-10-CM

## 2024-09-05 DIAGNOSIS — Z853 Personal history of malignant neoplasm of breast: Secondary | ICD-10-CM

## 2024-09-05 DIAGNOSIS — C541 Malignant neoplasm of endometrium: Secondary | ICD-10-CM

## 2024-09-05 LAB — POCT I-STAT CREATININE: Creatinine, Ser: 1.3 mg/dL — ABNORMAL HIGH (ref 0.44–1.00)

## 2024-09-05 MED ORDER — IOHEXOL 300 MG/ML  SOLN
80.0000 mL | Freq: Once | INTRAMUSCULAR | Status: AC | PRN
  Administered 2024-09-05: 80 mL via INTRAVENOUS

## 2024-09-12 ENCOUNTER — Inpatient Hospital Stay: Admitting: Hematology and Oncology

## 2024-09-12 ENCOUNTER — Inpatient Hospital Stay: Attending: Gynecologic Oncology

## 2024-09-12 ENCOUNTER — Inpatient Hospital Stay

## 2024-09-25 ENCOUNTER — Inpatient Hospital Stay: Admitting: Gynecologic Oncology

## 2024-10-01 ENCOUNTER — Ambulatory Visit: Admitting: Neurology

## 2024-10-07 ENCOUNTER — Ambulatory Visit: Admitting: Neurology

## 2024-11-28 ENCOUNTER — Ambulatory Visit: Admitting: Gynecologic Oncology

## 2024-12-15 ENCOUNTER — Ambulatory Visit: Admitting: Radiation Oncology
